# Patient Record
Sex: Male | Born: 1970 | Race: White | Hispanic: No | Marital: Single | Smoking: Never smoker
Health system: Southern US, Community
[De-identification: ages and names within clinical notes are randomized; demographics above are authoritative.]

## PROBLEM LIST (undated history)

## (undated) DIAGNOSIS — I10 Essential (primary) hypertension: Secondary | ICD-10-CM

## (undated) DIAGNOSIS — N189 Chronic kidney disease, unspecified: Secondary | ICD-10-CM

## (undated) DIAGNOSIS — K219 Gastro-esophageal reflux disease without esophagitis: Secondary | ICD-10-CM

## (undated) DIAGNOSIS — D649 Anemia, unspecified: Secondary | ICD-10-CM

## (undated) DIAGNOSIS — E119 Type 2 diabetes mellitus without complications: Secondary | ICD-10-CM

## (undated) DIAGNOSIS — R0600 Dyspnea, unspecified: Secondary | ICD-10-CM

## (undated) HISTORY — PX: CHOLECYSTECTOMY: SHX55

---

## 2016-07-19 DIAGNOSIS — E1142 Type 2 diabetes mellitus with diabetic polyneuropathy: Secondary | ICD-10-CM | POA: Insufficient documentation

## 2016-09-27 DIAGNOSIS — Z79899 Other long term (current) drug therapy: Secondary | ICD-10-CM | POA: Insufficient documentation

## 2020-02-12 DIAGNOSIS — R059 Cough, unspecified: Secondary | ICD-10-CM | POA: Insufficient documentation

## 2020-03-28 DIAGNOSIS — E43 Unspecified severe protein-calorie malnutrition: Secondary | ICD-10-CM | POA: Insufficient documentation

## 2020-04-06 DIAGNOSIS — T7840XS Allergy, unspecified, sequela: Secondary | ICD-10-CM | POA: Insufficient documentation

## 2020-04-06 DIAGNOSIS — Z87892 Personal history of anaphylaxis: Secondary | ICD-10-CM | POA: Insufficient documentation

## 2020-04-06 DIAGNOSIS — R52 Pain, unspecified: Secondary | ICD-10-CM | POA: Insufficient documentation

## 2020-04-06 DIAGNOSIS — D689 Coagulation defect, unspecified: Secondary | ICD-10-CM | POA: Insufficient documentation

## 2020-04-06 DIAGNOSIS — Z23 Encounter for immunization: Secondary | ICD-10-CM | POA: Insufficient documentation

## 2020-04-09 DIAGNOSIS — D509 Iron deficiency anemia, unspecified: Secondary | ICD-10-CM | POA: Insufficient documentation

## 2020-04-29 ENCOUNTER — Emergency Department (HOSPITAL_COMMUNITY): Payer: Medicare Other

## 2020-04-29 ENCOUNTER — Inpatient Hospital Stay (HOSPITAL_COMMUNITY)
Admission: EM | Admit: 2020-04-29 | Discharge: 2020-06-06 | DRG: 871 | Disposition: A | Discharge status: Rehabilitation Facility | Payer: Medicare Other | Source: Skilled Nursing Facility | Attending: Internal Medicine | Admitting: Internal Medicine

## 2020-04-29 ENCOUNTER — Inpatient Hospital Stay (HOSPITAL_COMMUNITY): Payer: Medicare Other

## 2020-04-29 ENCOUNTER — Other Ambulatory Visit: Payer: Self-pay

## 2020-04-29 DIAGNOSIS — J9811 Atelectasis: Secondary | ICD-10-CM | POA: Diagnosis not present

## 2020-04-29 DIAGNOSIS — L89154 Pressure ulcer of sacral region, stage 4: Secondary | ICD-10-CM | POA: Diagnosis not present

## 2020-04-29 DIAGNOSIS — E11621 Type 2 diabetes mellitus with foot ulcer: Secondary | ICD-10-CM | POA: Diagnosis present

## 2020-04-29 DIAGNOSIS — I10 Essential (primary) hypertension: Secondary | ICD-10-CM | POA: Diagnosis not present

## 2020-04-29 DIAGNOSIS — D631 Anemia in chronic kidney disease: Secondary | ICD-10-CM | POA: Diagnosis present

## 2020-04-29 DIAGNOSIS — E1169 Type 2 diabetes mellitus with other specified complication: Secondary | ICD-10-CM | POA: Diagnosis present

## 2020-04-29 DIAGNOSIS — Z9119 Patient's noncompliance with other medical treatment and regimen: Secondary | ICD-10-CM

## 2020-04-29 DIAGNOSIS — Z862 Personal history of diseases of the blood and blood-forming organs and certain disorders involving the immune mechanism: Secondary | ICD-10-CM

## 2020-04-29 DIAGNOSIS — G7281 Critical illness myopathy: Secondary | ICD-10-CM | POA: Diagnosis present

## 2020-04-29 DIAGNOSIS — E785 Hyperlipidemia, unspecified: Secondary | ICD-10-CM | POA: Diagnosis present

## 2020-04-29 DIAGNOSIS — S91031A Puncture wound without foreign body, right ankle, initial encounter: Secondary | ICD-10-CM | POA: Diagnosis not present

## 2020-04-29 DIAGNOSIS — E46 Unspecified protein-calorie malnutrition: Secondary | ICD-10-CM | POA: Diagnosis not present

## 2020-04-29 DIAGNOSIS — S31000A Unspecified open wound of lower back and pelvis without penetration into retroperitoneum, initial encounter: Secondary | ICD-10-CM

## 2020-04-29 DIAGNOSIS — M87076 Idiopathic aseptic necrosis of unspecified foot: Secondary | ICD-10-CM

## 2020-04-29 DIAGNOSIS — Z7401 Bed confinement status: Secondary | ICD-10-CM

## 2020-04-29 DIAGNOSIS — Z794 Long term (current) use of insulin: Secondary | ICD-10-CM

## 2020-04-29 DIAGNOSIS — Z6841 Body Mass Index (BMI) 40.0 and over, adult: Secondary | ICD-10-CM

## 2020-04-29 DIAGNOSIS — Z9049 Acquired absence of other specified parts of digestive tract: Secondary | ICD-10-CM | POA: Diagnosis not present

## 2020-04-29 DIAGNOSIS — E43 Unspecified severe protein-calorie malnutrition: Secondary | ICD-10-CM | POA: Diagnosis present

## 2020-04-29 DIAGNOSIS — J1282 Pneumonia due to coronavirus disease 2019: Secondary | ICD-10-CM | POA: Diagnosis present

## 2020-04-29 DIAGNOSIS — E1122 Type 2 diabetes mellitus with diabetic chronic kidney disease: Secondary | ICD-10-CM | POA: Diagnosis present

## 2020-04-29 DIAGNOSIS — L89610 Pressure ulcer of right heel, unstageable: Secondary | ICD-10-CM | POA: Diagnosis present

## 2020-04-29 DIAGNOSIS — Z79899 Other long term (current) drug therapy: Secondary | ICD-10-CM

## 2020-04-29 DIAGNOSIS — T827XXA Infection and inflammatory reaction due to other cardiac and vascular devices, implants and grafts, initial encounter: Secondary | ICD-10-CM | POA: Diagnosis not present

## 2020-04-29 DIAGNOSIS — L89324 Pressure ulcer of left buttock, stage 4: Secondary | ICD-10-CM | POA: Diagnosis present

## 2020-04-29 DIAGNOSIS — R52 Pain, unspecified: Secondary | ICD-10-CM | POA: Diagnosis not present

## 2020-04-29 DIAGNOSIS — L89150 Pressure ulcer of sacral region, unstageable: Secondary | ICD-10-CM | POA: Diagnosis present

## 2020-04-29 DIAGNOSIS — S91302A Unspecified open wound, left foot, initial encounter: Secondary | ICD-10-CM | POA: Diagnosis not present

## 2020-04-29 DIAGNOSIS — E1152 Type 2 diabetes mellitus with diabetic peripheral angiopathy with gangrene: Secondary | ICD-10-CM | POA: Diagnosis present

## 2020-04-29 DIAGNOSIS — L89620 Pressure ulcer of left heel, unstageable: Secondary | ICD-10-CM | POA: Diagnosis present

## 2020-04-29 DIAGNOSIS — N186 End stage renal disease: Secondary | ICD-10-CM | POA: Diagnosis present

## 2020-04-29 DIAGNOSIS — A4189 Other specified sepsis: Principal | ICD-10-CM | POA: Diagnosis present

## 2020-04-29 DIAGNOSIS — E1140 Type 2 diabetes mellitus with diabetic neuropathy, unspecified: Secondary | ICD-10-CM | POA: Diagnosis present

## 2020-04-29 DIAGNOSIS — R195 Other fecal abnormalities: Secondary | ICD-10-CM | POA: Diagnosis not present

## 2020-04-29 DIAGNOSIS — S91309A Unspecified open wound, unspecified foot, initial encounter: Secondary | ICD-10-CM | POA: Diagnosis not present

## 2020-04-29 DIAGNOSIS — Z992 Dependence on renal dialysis: Secondary | ICD-10-CM | POA: Diagnosis not present

## 2020-04-29 DIAGNOSIS — M879 Osteonecrosis, unspecified: Secondary | ICD-10-CM | POA: Diagnosis present

## 2020-04-29 DIAGNOSIS — L97529 Non-pressure chronic ulcer of other part of left foot with unspecified severity: Secondary | ICD-10-CM | POA: Diagnosis present

## 2020-04-29 DIAGNOSIS — M4628 Osteomyelitis of vertebra, sacral and sacrococcygeal region: Secondary | ICD-10-CM | POA: Diagnosis present

## 2020-04-29 DIAGNOSIS — I120 Hypertensive chronic kidney disease with stage 5 chronic kidney disease or end stage renal disease: Secondary | ICD-10-CM | POA: Diagnosis present

## 2020-04-29 DIAGNOSIS — U071 COVID-19: Secondary | ICD-10-CM | POA: Diagnosis present

## 2020-04-29 DIAGNOSIS — E119 Type 2 diabetes mellitus without complications: Secondary | ICD-10-CM

## 2020-04-29 DIAGNOSIS — D649 Anemia, unspecified: Secondary | ICD-10-CM | POA: Diagnosis not present

## 2020-04-29 DIAGNOSIS — L89156 Pressure-induced deep tissue damage of sacral region: Secondary | ICD-10-CM | POA: Diagnosis not present

## 2020-04-29 DIAGNOSIS — L03312 Cellulitis of back [any part except buttock]: Secondary | ICD-10-CM | POA: Diagnosis present

## 2020-04-29 DIAGNOSIS — A0472 Enterocolitis due to Clostridium difficile, not specified as recurrent: Secondary | ICD-10-CM | POA: Diagnosis not present

## 2020-04-29 DIAGNOSIS — N2581 Secondary hyperparathyroidism of renal origin: Secondary | ICD-10-CM | POA: Diagnosis present

## 2020-04-29 DIAGNOSIS — S91301A Unspecified open wound, right foot, initial encounter: Secondary | ICD-10-CM | POA: Diagnosis not present

## 2020-04-29 DIAGNOSIS — L97519 Non-pressure chronic ulcer of other part of right foot with unspecified severity: Secondary | ICD-10-CM | POA: Diagnosis present

## 2020-04-29 DIAGNOSIS — I96 Gangrene, not elsewhere classified: Secondary | ICD-10-CM | POA: Diagnosis not present

## 2020-04-29 DIAGNOSIS — R509 Fever, unspecified: Secondary | ICD-10-CM | POA: Diagnosis present

## 2020-04-29 DIAGNOSIS — R5383 Other fatigue: Secondary | ICD-10-CM | POA: Diagnosis not present

## 2020-04-29 DIAGNOSIS — A419 Sepsis, unspecified organism: Secondary | ICD-10-CM | POA: Diagnosis not present

## 2020-04-29 DIAGNOSIS — Y828 Other medical devices associated with adverse incidents: Secondary | ICD-10-CM | POA: Diagnosis not present

## 2020-04-29 DIAGNOSIS — Z6838 Body mass index (BMI) 38.0-38.9, adult: Secondary | ICD-10-CM | POA: Diagnosis not present

## 2020-04-29 DIAGNOSIS — K5900 Constipation, unspecified: Secondary | ICD-10-CM | POA: Diagnosis not present

## 2020-04-29 DIAGNOSIS — I951 Orthostatic hypotension: Secondary | ICD-10-CM | POA: Diagnosis not present

## 2020-04-29 DIAGNOSIS — M869 Osteomyelitis, unspecified: Secondary | ICD-10-CM

## 2020-04-29 DIAGNOSIS — R197 Diarrhea, unspecified: Secondary | ICD-10-CM | POA: Diagnosis not present

## 2020-04-29 DIAGNOSIS — Z8619 Personal history of other infectious and parasitic diseases: Secondary | ICD-10-CM | POA: Diagnosis not present

## 2020-04-29 DIAGNOSIS — E1165 Type 2 diabetes mellitus with hyperglycemia: Secondary | ICD-10-CM | POA: Diagnosis not present

## 2020-04-29 DIAGNOSIS — U099 Post covid-19 condition, unspecified: Secondary | ICD-10-CM | POA: Diagnosis present

## 2020-04-29 DIAGNOSIS — N189 Chronic kidney disease, unspecified: Secondary | ICD-10-CM

## 2020-04-29 DIAGNOSIS — K567 Ileus, unspecified: Secondary | ICD-10-CM | POA: Diagnosis not present

## 2020-04-29 DIAGNOSIS — M8619 Other acute osteomyelitis, multiple sites: Secondary | ICD-10-CM | POA: Diagnosis not present

## 2020-04-29 DIAGNOSIS — Z20822 Contact with and (suspected) exposure to covid-19: Secondary | ICD-10-CM | POA: Diagnosis present

## 2020-04-29 DIAGNOSIS — L89890 Pressure ulcer of other site, unstageable: Secondary | ICD-10-CM | POA: Diagnosis present

## 2020-04-29 HISTORY — DX: Chronic kidney disease, unspecified: N18.9

## 2020-04-29 HISTORY — DX: Type 2 diabetes mellitus without complications: E11.9

## 2020-04-29 HISTORY — DX: Essential (primary) hypertension: I10

## 2020-04-29 LAB — COMPREHENSIVE METABOLIC PANEL
ALT: 21 U/L (ref 0–44)
ALT: 18 U/L (ref 0–44)
AST: 46 U/L — ABNORMAL HIGH (ref 15–41)
AST: 41 U/L (ref 15–41)
Albumin: 1.6 g/dL — ABNORMAL LOW (ref 3.5–5.0)
Albumin: 1.5 g/dL — ABNORMAL LOW (ref 3.5–5.0)
Alkaline Phosphatase: 186 U/L — ABNORMAL HIGH (ref 38–126)
Alkaline Phosphatase: 177 U/L — ABNORMAL HIGH (ref 38–126)
Anion gap: 10 (ref 5–15)
Anion gap: 10 (ref 5–15)
BUN: 11 mg/dL (ref 6–20)
BUN: 15 mg/dL (ref 6–20)
CO2: 26 mmol/L (ref 22–32)
CO2: 23 mmol/L (ref 22–32)
Calcium: 7.3 mg/dL — ABNORMAL LOW (ref 8.9–10.3)
Calcium: 7.2 mg/dL — ABNORMAL LOW (ref 8.9–10.3)
Chloride: 101 mmol/L (ref 98–111)
Chloride: 102 mmol/L (ref 98–111)
Creatinine, Ser: 4.41 mg/dL — ABNORMAL HIGH (ref 0.61–1.24)
Creatinine, Ser: 4.62 mg/dL — ABNORMAL HIGH (ref 0.61–1.24)
GFR, Estimated: 16 mL/min — ABNORMAL LOW (ref >60)
GFR, Estimated: 15 mL/min — ABNORMAL LOW (ref >60)
Glucose, Bld: 84 mg/dL (ref 70–99)
Glucose, Bld: 80 mg/dL (ref 70–99)
Potassium: 3.8 mmol/L (ref 3.5–5.1)
Potassium: 3.5 mmol/L (ref 3.5–5.1)
Sodium: 137 mmol/L (ref 135–145)
Sodium: 135 mmol/L (ref 135–145)
Total Bilirubin: 0.5 mg/dL (ref 0.3–1.2)
Total Bilirubin: 0.7 mg/dL (ref 0.3–1.2)
Total Protein: 6 g/dL — ABNORMAL LOW (ref 6.5–8.1)
Total Protein: 6 g/dL — ABNORMAL LOW (ref 6.5–8.1)

## 2020-04-29 LAB — CBC WITH DIFFERENTIAL/PLATELET
Abs Immature Granulocytes: 0 10*3/uL (ref 0.00–0.07)
Basophils Absolute: 0 10*3/uL (ref 0.0–0.1)
Basophils Absolute: 0 10*3/uL (ref 0.0–0.1)
Basophils Relative: 0 %
Basophils Relative: 0 %
Eosinophils Absolute: 0 10*3/uL (ref 0.0–0.5)
Eosinophils Absolute: 0 10*3/uL (ref 0.0–0.5)
Eosinophils Relative: 0 %
Eosinophils Relative: 0 %
HCT: 27.1 % — ABNORMAL LOW (ref 39.0–52.0)
HCT: 27.8 % — ABNORMAL LOW (ref 39.0–52.0)
Hemoglobin: 7.8 g/dL — ABNORMAL LOW (ref 13.0–17.0)
Hemoglobin: 8.3 g/dL — ABNORMAL LOW (ref 13.0–17.0)
Lymphocytes Relative: 0 %
Lymphocytes Relative: 1 %
Lymphs Abs: 0 10*3/uL — ABNORMAL LOW (ref 0.7–4.0)
Lymphs Abs: 0.1 10*3/uL — ABNORMAL LOW (ref 0.7–4.0)
MCH: 24.9 pg — ABNORMAL LOW (ref 26.0–34.0)
MCH: 25.6 pg — ABNORMAL LOW (ref 26.0–34.0)
MCHC: 28.8 g/dL — ABNORMAL LOW (ref 30.0–36.0)
MCHC: 29.9 g/dL — ABNORMAL LOW (ref 30.0–36.0)
MCV: 86.6 fL (ref 80.0–100.0)
MCV: 85.8 fL (ref 80.0–100.0)
Monocytes Absolute: 0.1 10*3/uL (ref 0.1–1.0)
Monocytes Absolute: 0.2 10*3/uL (ref 0.1–1.0)
Monocytes Relative: 1 %
Monocytes Relative: 3 %
Myelocytes: 1 %
Neutro Abs: 5.7 10*3/uL (ref 1.7–7.7)
Neutro Abs: 5.4 10*3/uL (ref 1.7–7.7)
Neutrophils Relative %: 99 %
Neutrophils Relative %: 94 %
Platelets: 251 10*3/uL (ref 150–400)
Platelets: 242 10*3/uL (ref 150–400)
Promyelocytes Relative: 1 %
RBC: 3.13 MIL/uL — ABNORMAL LOW (ref 4.22–5.81)
RBC: 3.24 MIL/uL — ABNORMAL LOW (ref 4.22–5.81)
RDW: 16.4 % — ABNORMAL HIGH (ref 11.5–15.5)
RDW: 16.4 % — ABNORMAL HIGH (ref 11.5–15.5)
WBC: 5.8 10*3/uL (ref 4.0–10.5)
WBC: 5.7 10*3/uL (ref 4.0–10.5)
nRBC: 0 % (ref 0.0–0.2)
nRBC: 0 /100 WBC
nRBC: 0 % (ref 0.0–0.2)
nRBC: 0 /100 WBC

## 2020-04-29 LAB — POC OCCULT BLOOD, ED: Fecal Occult Bld: NEGATIVE

## 2020-04-29 LAB — CBG MONITORING, ED: Glucose-Capillary: 82 mg/dL (ref 70–99)

## 2020-04-29 LAB — RESP PANEL BY RT-PCR (FLU A&B, COVID) ARPGX2
Influenza A by PCR: NEGATIVE
Influenza B by PCR: NEGATIVE
SARS Coronavirus 2 by RT PCR: POSITIVE — AB

## 2020-04-29 LAB — LACTIC ACID, PLASMA
Lactic Acid, Venous: 0.9 mmol/L (ref 0.5–1.9)
Lactic Acid, Venous: 0.9 mmol/L (ref 0.5–1.9)

## 2020-04-29 LAB — PROCALCITONIN: Procalcitonin: 1.24 ng/mL

## 2020-04-29 LAB — FERRITIN: Ferritin: 1520 ng/mL — ABNORMAL HIGH (ref 24–336)

## 2020-04-29 LAB — HIV ANTIBODY (ROUTINE TESTING W REFLEX): HIV Screen 4th Generation wRfx: NONREACTIVE

## 2020-04-29 LAB — PROTIME-INR
INR: 1.2 (ref 0.8–1.2)
Prothrombin Time: 14.7 seconds (ref 11.4–15.2)

## 2020-04-29 LAB — APTT: aPTT: 38 seconds — ABNORMAL HIGH (ref 24–36)

## 2020-04-29 LAB — C-REACTIVE PROTEIN: CRP: 12.6 mg/dL — ABNORMAL HIGH (ref <1.0)

## 2020-04-29 LAB — D-DIMER, QUANTITATIVE: D-Dimer, Quant: 2.4 ug/mL-FEU — ABNORMAL HIGH (ref 0.00–0.50)

## 2020-04-29 MED ORDER — VANCOMYCIN VARIABLE DOSE PER UNSTABLE RENAL FUNCTION (PHARMACIST DOSING): Status: DC

## 2020-04-29 MED ORDER — SODIUM CHLORIDE 0.9 % IV SOLN
2.0000 g | Freq: Once | INTRAVENOUS | Status: AC
  Administered 2020-04-29: 2 g via INTRAVENOUS
  Filled 2020-04-29: qty 2

## 2020-04-29 MED ORDER — VANCOMYCIN HCL 10 G IV SOLR
2500.0000 mg | Freq: Once | INTRAVENOUS | Status: AC
  Administered 2020-04-29: 2500 mg via INTRAVENOUS
  Filled 2020-04-29: qty 2500

## 2020-04-29 MED ORDER — INSULIN ASPART 100 UNIT/ML ~~LOC~~ SOLN: 0.0000 [IU] | SUBCUTANEOUS | Status: DC

## 2020-04-29 MED ORDER — SODIUM CHLORIDE 0.9 % IV SOLN
1.0000 g | INTRAVENOUS | Status: DC
  Filled 2020-04-29: qty 1

## 2020-04-29 MED ORDER — ONDANSETRON HCL 4 MG/2ML IJ SOLN: 4.0000 mg | Freq: Four times a day (QID) | INTRAMUSCULAR | Status: DC | PRN

## 2020-04-29 MED ORDER — HEPARIN SODIUM (PORCINE) 5000 UNIT/ML IJ SOLN
5000.0000 [IU] | Freq: Three times a day (TID) | INTRAMUSCULAR | Status: DC
  Administered 2020-04-30 – 2020-05-03 (×11): 5000 [IU] via SUBCUTANEOUS
  Filled 2020-04-29 (×11): qty 1

## 2020-04-29 MED ORDER — OXYCODONE-ACETAMINOPHEN 5-325 MG PO TABS
1.0000 | ORAL_TABLET | Freq: Four times a day (QID) | ORAL | Status: DC | PRN
  Administered 2020-05-01: 1 via ORAL
  Filled 2020-04-29: qty 1

## 2020-04-29 MED ORDER — METRONIDAZOLE IN NACL 5-0.79 MG/ML-% IV SOLN
500.0000 mg | Freq: Once | INTRAVENOUS | Status: AC
  Administered 2020-04-29: 500 mg via INTRAVENOUS
  Filled 2020-04-29: qty 100

## 2020-04-29 MED ORDER — FAMOTIDINE 20 MG PO TABS
20.0000 mg | ORAL_TABLET | Freq: Two times a day (BID) | ORAL | Status: DC
  Administered 2020-04-30 – 2020-05-08 (×17): 20 mg via ORAL
  Filled 2020-04-29 (×17): qty 1

## 2020-04-29 MED ORDER — FENTANYL CITRATE (PF) 100 MCG/2ML IJ SOLN
100.0000 ug | Freq: Once | INTRAMUSCULAR | Status: DC
  Filled 2020-04-29: qty 2

## 2020-04-29 MED ORDER — SODIUM CHLORIDE 0.9% FLUSH
3.0000 mL | Freq: Two times a day (BID) | INTRAVENOUS | Status: DC
  Administered 2020-04-30 – 2020-06-06 (×58): 3 mL via INTRAVENOUS

## 2020-04-29 MED ORDER — ONDANSETRON HCL 4 MG PO TABS: 4.0000 mg | ORAL_TABLET | Freq: Four times a day (QID) | ORAL | Status: DC | PRN

## 2020-04-29 MED ORDER — ACETAMINOPHEN 325 MG PO TABS
650.0000 mg | ORAL_TABLET | Freq: Once | ORAL | Status: AC
  Administered 2020-04-29: 650 mg via ORAL
  Filled 2020-04-29: qty 2

## 2020-04-29 MED ORDER — DIAZEPAM 5 MG/ML IJ SOLN
2.5000 mg | Freq: Once | INTRAMUSCULAR | Status: AC
  Administered 2020-04-29: 2.5 mg via INTRAVENOUS
  Filled 2020-04-29: qty 2

## 2020-04-29 MED ORDER — AMLODIPINE BESYLATE 5 MG PO TABS
10.0000 mg | ORAL_TABLET | Freq: Every day | ORAL | Status: DC
  Administered 2020-04-29: 10 mg via ORAL
  Filled 2020-04-29: qty 2

## 2020-04-29 MED ORDER — SODIUM CHLORIDE 0.9 % IV SOLN
100.0000 mg | Freq: Every day | INTRAVENOUS | Status: AC
  Administered 2020-04-30 – 2020-05-03 (×4): 100 mg via INTRAVENOUS
  Filled 2020-04-29 (×5): qty 20

## 2020-04-29 MED ORDER — METOCLOPRAMIDE HCL 5 MG PO TABS
5.0000 mg | ORAL_TABLET | Freq: Three times a day (TID) | ORAL | Status: DC
  Administered 2020-04-30 – 2020-06-05 (×72): 5 mg via ORAL
  Filled 2020-04-29 (×90): qty 1

## 2020-04-29 MED ORDER — SENNA 8.6 MG PO TABS
1.0000 | ORAL_TABLET | Freq: Two times a day (BID) | ORAL | Status: DC
  Administered 2020-04-30 – 2020-05-29 (×30): 8.6 mg via ORAL
  Filled 2020-04-29 (×53): qty 1

## 2020-04-29 MED ORDER — SODIUM CHLORIDE 0.9 % IV SOLN
200.0000 mg | Freq: Once | INTRAVENOUS | Status: AC
  Administered 2020-04-29: 200 mg via INTRAVENOUS
  Filled 2020-04-29: qty 40

## 2020-04-29 MED ORDER — VANCOMYCIN HCL IN DEXTROSE 1-5 GM/200ML-% IV SOLN: 1000.0000 mg | Freq: Once | INTRAVENOUS | Status: DC

## 2020-04-29 MED ORDER — ACETAMINOPHEN 325 MG PO TABS
650.0000 mg | ORAL_TABLET | Freq: Four times a day (QID) | ORAL | Status: DC | PRN
  Administered 2020-05-28: 650 mg via ORAL
  Filled 2020-04-29: qty 2

## 2020-04-29 NOTE — Progress Notes (Incomplete)
ESRD new (MWF) last on Wednesday. Still making urine  hypoxic 82%  [place] 3 lL Febrile tachy  Flagyl cefepime and vanc COVID november 30th. Versus bacterial infection  Sacral wound cellulites Left necrotic  Hgb 7.8 FOBt negative  Concern for possibly PE

## 2020-04-29 NOTE — ED Provider Notes (Signed)
  49 year old male from [place], ESRD on HD.  Hypoxic to 82% on room air at the facility and now with new O2 requirement of 3 L nasal cannula.  Concern for sepsis given his fever and tachycardia.  Given Flagyl, cefepime, vancomycin.  Has a sacral wound that is possible source of infection with surrounding cellulitis.  Additionally, his left toe might be necrotic.  Hemoglobin of 7.8 but fecal occult was negative.  No records in our EMR as he is usually seen outside hospitals.  Has been Covid positive since 03/24/2020. Discussed concern for PE but he is still making urine and so we will hold off on contrasted scan at this time; may need VQ scan.  Additionally, holding on Decadron for Covid due to concern for cellulitis.  Plan -Admit  Physical Exam  BP (!) 187/108   Pulse (!) 101   Temp (!) 100.9 F (38.3 C)   Resp (!) 22   Ht 6\' 6"  (1.981 m)   Wt (!) 158.8 kg   SpO2 98%   BMI 40.45 kg/m   Physical Exam Constitutional:      General: He is not in acute distress.    Appearance: He is obese. He is not ill-appearing.  Neurological:     Mental Status: He is alert.     ED Course/Procedures   Clinical Course as of 04/29/20 1425  Thu Apr 29, 2020  1219 DG Chest [place] 1 View IMPRESSION: Diffuse bilateral airspace disease most likely pneumonia. Correlate with COVID-19 status. [CG]  1219 Temp(!): 100.9 F (38.3 C) [CG]  1219 Pulse Rate(!): 101 [CG]  1219 Resp(!): 21 [CG]  1219 SpO2: 100 % On 3 L [place]  [CG]  1249 Hemoglobin(!): 7.8 [CG]  1249 Creatinine(!): 4.41 [CG]  1249 Alkaline Phosphatase(!): 186 [CG]  1249 Anion gap: 10 [CG]  1249 AST(!): 46 [CG]  1249 Lactic Acid, Venous: 0.9 [CG]  1304 Patient gave verbal consent to speak to sister. Called sister. Reports patient has had "mental" changes, refusing medicines, refusing food for last 3 days. Reports patient has been "odd" all his life, ?Asperger's? Was functional, worked, Social research officer, government. Last year began declining. Sick on and off. September  was septic, kidney failure, started HD. Source was GB, removed JP drain, several antibiotics. H/o C.diff. Patient originally from [place]. Went to [facility], started walking. Then got sick again, discharged to [facility]. Been there for 3 weeks. History of anemia on iron supplements. Has had a cough since GB surgery.  [CG]    Clinical Course User Index [CG] Kinnie Feil, PA-C    Procedures  MDM  On assessment just after handoff, patient satting well on 3 L nasal cannula.  Patient denies any chest pain or shortness of breath at this time.  Patient has a right Vas-Cath and states that he gets dialysis Monday/Wednesday/Friday and last had a full session yesterday.  Given lack of chest pain or shortness of breath, do not feel that emergent CT PE is indicated at this time.  May need additional imaging inpatient.   Hospitalist consulted for admission, handoff given. Admitted in stable condition.          Darrick Huntsman, MD 04/29/20 1931    Maudie Flakes, MD 05/03/20 2326

## 2020-04-29 NOTE — Progress Notes (Signed)
RT instructed patient on the use of a flutter valve and incentive spirometer. Patient able to reach 1250 mL with the incentive spirometer and had a strong non productive cough after the flutter valve.

## 2020-04-29 NOTE — ED Triage Notes (Signed)
BIB by [facility] from [place] after facility called to report that pt 02 sat was 82% on RA. Per EMS, pt placed on 3 L [place]. PT tested positive for covid on 11/30. Pt currently denies ShOB. Pt has hx of HTN, DM, dialysis, + c.diff. Per facility pt has been noncompliant with HTN meds x 3.

## 2020-04-29 NOTE — Progress Notes (Signed)
Pharmacy Antibiotic Note  Fred [name] is a 49 y.o. male admitted on 04/29/2020 with sepsis.  Pharmacy has been consulted for vancomycin and cefepime dosing.  Febrile, tachy, RR 21.  Hx ESRD-HD  Plan: Vancomycin 2500 mg IV x 1, then 1000 mg IV qHD (target pre-HD vancomycin level 15-25) Cefepime 2g IV x 1, then 1g IV q24h Monitor HD schedule, Cx and clinical progression to narrow Vancomycin level as needed  Height: 6\' 6"  (198.1 cm) Weight: (!) 158.8 kg (350 lb) IBW/kg (Calculated) : 91.4  Temp (24hrs), Avg:100.9 F (38.3 C), Min:100.9 F (38.3 C), Max:100.9 F (38.3 C)  No results for input(s): WBC, CREATININE, LATICACIDVEN, VANCOTROUGH, VANCOPEAK, VANCORANDOM, GENTTROUGH, GENTPEAK, GENTRANDOM, TOBRATROUGH, TOBRAPEAK, TOBRARND, AMIKACINPEAK, AMIKACINTROU, AMIKACIN in the last 168 hours.  CrCl cannot be calculated (No successful lab value found.).    Not on File  Bertis Ruddy, PharmD Clinical Pharmacist ED Pharmacist Phone # [phone] 04/29/2020 11:56 AM

## 2020-04-29 NOTE — ED Notes (Signed)
Pt transported to MRI 

## 2020-04-29 NOTE — ED Notes (Signed)
Wound assessment completed by PA Gibbons:  Buttock 7 x 5 Right MTP 2 x 2 Right heel 7 x 4 Left MTP 3 x 2 Left 4th toe dry gangrene Left 5th toe red

## 2020-04-29 NOTE — H&P (Addendum)
Date: 04/29/2020               Patient Name:  Fred [name] MRN: [id number]  DOB: 04/19/1971 Age / Sex: 49 y.o., male   PCP: Pcp, No         Medical Service: Internal Medicine Teaching Service         Attending Physician: Dr. Maudie Flakes, MD    First Contact: Iona Beard Pager: [phone]  Second Contact: Blenda Nicely Pager: [place] [phone])       After Hours (After 5p/  First Contact Pager: [phone]  weekends / holidays): Second Contact Pager: [phone]   Chief Complaint: SOB  History of Present Illness: 49 y/o male with  history of HTN, DM on insulin, ESRD, Anemia who presents from [facility] because the staff stated that he wasnt eating the way he usually does. Patient states that he has been feeling his usual self and has been having more pain from his sacral wound with increasing foul smell he is unsure how long he has had this sacral wound, but has had it for at least the past several months. Patient tested positive for COVID on 04/20/2020, not previously vaccinate. States he has been having cough producing yellow green sputum for the last month, but denies shortness of breath, loss smell or taste, fever, chest pain, SOB, chills, abdominal pain, nausea, vomiting or diarrhea.   He also notes pain in both feet from bilateral foot wounds. States the used to be more painful but has not been able to feel due to numbness. Had his gall bladder removed due to infections and found out he had kidney problems this occurred at a hospital in [place] 4-5 months ago. He reports being on ESRD for the past month on MWF was able complete session yesterday. Still makes a small amount of urine.   ED Course: Found to be Hypertensive, tachycardic hyperthermic. WBC 5.8, hgb 7.8, BUN 11, Cr. 4.4 unknown baseline, lactic acid normal x2. COVID positive. CXR with diffuse bilateral airspace disease. No osteomyelitis on bilateral foot xrays, pelvic xray, and ankle xrays.    Lab Orders     Blood  Culture (routine x 2)     Urine culture     Resp Panel by RT-PCR (Flu A&B, Covid) Nasopharyngeal Swab     Lactic acid, plasma     Comprehensive metabolic panel     CBC WITH DIFFERENTIAL     Protime-INR     APTT     Urinalysis, Routine w reflex microscopic     Procalcitonin - Baseline     Procalcitonin     POC occult blood, ED   Meds:  Current Meds  Medication Sig  . acetaminophen (TYLENOL) 325 MG tablet Take 650 mg by mouth every 6 (six) hours as needed for mild pain or fever (>101 F).  Marland Kitchen amLODipine (NORVASC) 10 MG tablet Take 10 mg by mouth daily.  . benzonatate (TESSALON) 100 MG capsule Take 100 mg by mouth 3 (three) times daily as needed for cough.  . Cholecalciferol 25 MCG (1000 UT) tablet Take 1,000 Units by mouth daily.  Marland Kitchen epoetin alfa-epbx (RETACRIT) 63149 UNIT/ML injection Inject 10,000 Units into the skin every Monday, Wednesday, and Friday.  . famotidine (PEPCID) 20 MG tablet Take 20 mg by mouth 2 (two) times daily.  . hydrALAZINE (APRESOLINE) 10 MG tablet Take 10 mg by mouth every 6 (six) hours as needed (SBP>180 OR DBP>100).  . insulin lispro (HUMALOG) 100 UNIT/ML injection Inject  5-15 Units into the skin 4 (four) times daily -  with meals and at bedtime. Per sliding scale 70-79= 0 units 180-200= 5 units 201-250= 8 units 251-300= 10 units 301-350= 12 units 351-400= 15 units Greater than 400 call MD  . iron polysaccharides (NIFEREX) 150 MG capsule Take 150 mg by mouth daily.  . Lactobacillus Rhamnosus, GG, (CULTURELLE PO) Take 1 capsule by mouth daily. 10 Billion cell capsule  . metoCLOPramide (REGLAN) 5 MG tablet Take 5 mg by mouth 3 (three) times daily before meals.  . metoprolol tartrate (LOPRESSOR) 50 MG tablet Take 50 mg by mouth 2 (two) times daily.  . multivitamin (RENA-VIT) TABS tablet Take 1 tablet by mouth daily.  Marland Kitchen oxyCODONE-acetaminophen (PERCOCET/ROXICET) 5-325 MG tablet Take 1 tablet by mouth every 6 (six) hours as needed for severe pain or moderate pain  ((4-6) for up to 5 days).  . sodium chloride 1 g tablet Take 1 g by mouth in the morning and at bedtime.  . tamsulosin (FLOMAX) 0.4 MG CAPS capsule Take 0.4 mg by mouth daily.    Past medical history: No past medical history on file.  HTN, DM on insulin, ESRD  Social: Patient from [facility]. Live with father. Denies tobacco, alcohol and drug use.   Family History: Mother with history heart disease, does not know of any medical history in father  Allergies: Allergies as of 04/29/2020 - Review Complete 04/29/2020  Allergen Reaction Noted  . Atorvastatin Other (See Comments) 04/29/2020   No past medical history on file.   Review of Systems: A complete ROS was negative except as per HPI.   Physical Exam: Blood pressure (!) 182/102, pulse (!) 101, temperature 98.6 F (37 C), temperature source Oral, resp. rate 15, height 6\' 6"  (1.981 m), weight (!) 158.8 kg, SpO2 96 %. Physical Exam Constitutional:      Appearance: He is obese.  HENT:     Head: Normocephalic and atraumatic.  Eyes:     Extraocular Movements: Extraocular movements intact.     Pupils: Pupils are equal, round, and reactive to light.  Cardiovascular:     Rate and Rhythm: Regular rhythm. Tachycardia present.     Heart sounds: No murmur heard.     Comments: diminished PT, DP pulses bilaterally Pulmonary:     Effort: Pulmonary effort is normal. No tachypnea or accessory muscle usage.     Breath sounds: Normal breath sounds.  Abdominal:     General: Bowel sounds are normal.     Palpations: Abdomen is soft.     Tenderness: There is no abdominal tenderness.  Musculoskeletal:     Cervical back: Normal range of motion and neck supple.     Right lower leg: Edema present.     Left lower leg: Edema present.  Skin:    Capillary Refill: Capillary refill takes less than 2 seconds.     Comments: Bilateral lower extremities cool to touch, hairless, see images for sacral wound, See images of multiple unstable ulcers on  bilateral feet, and necrotic left 4th toe  Neurological:     General: No focal deficit present.     Mental Status: He is alert and oriented to person, place, and time.  Psychiatric:        Mood and Affect: Mood normal.        Behavior: Behavior normal.    Media Information                Labs: CBC    Component Value Date/Time  WBC 5.8 04/29/2020 1214   RBC 3.13 (L) 04/29/2020 1214   HGB 7.8 (L) 04/29/2020 1214   HCT 27.1 (L) 04/29/2020 1214   PLT 251 04/29/2020 1214   MCV 86.6 04/29/2020 1214   MCH 24.9 (L) 04/29/2020 1214   MCHC 28.8 (L) 04/29/2020 1214   RDW 16.4 (H) 04/29/2020 1214   LYMPHSABS 0.0 (L) 04/29/2020 1214   MONOABS 0.1 04/29/2020 1214   EOSABS 0.0 04/29/2020 1214   BASOSABS 0.0 04/29/2020 1214     CMP     Component Value Date/Time   NA 137 04/29/2020 1214   K 3.8 04/29/2020 1214   CL 101 04/29/2020 1214   CO2 26 04/29/2020 1214   GLUCOSE 84 04/29/2020 1214   BUN 11 04/29/2020 1214   CREATININE 4.41 (H) 04/29/2020 1214   CALCIUM 7.3 (L) 04/29/2020 1214   PROT 6.0 (L) 04/29/2020 1214   ALBUMIN 1.6 (L) 04/29/2020 1214   AST 46 (H) 04/29/2020 1214   ALT 21 04/29/2020 1214   ALKPHOS 186 (H) 04/29/2020 1214   BILITOT 0.5 04/29/2020 1214   GFRNONAA 16 (L) 04/29/2020 1214    Imaging: DG Ankle 2 Views Right  Result Date: 04/29/2020 CLINICAL DATA:  Bilateral foot wounds EXAM: RIGHT ANKLE - 2 VIEW COMPARISON:  Right foot series today FINDINGS: Diffuse dense vascular calcifications. No acute bony abnormality. Specifically, no fracture, subluxation, or dislocation. No bone destruction. Soft tissues are intact. Plantar calcaneal spur. IMPRESSION: No acute bony abnormality. Electronically Signed   By: Rolm Baptise M.D.   On: 04/29/2020 13:40   DG Pelvis Portable  Result Date: 04/29/2020 CLINICAL DATA:  Sacral wounds EXAM: PORTABLE PELVIS 1-2 VIEWS COMPARISON:  None. FINDINGS: No acute bony abnormality. Specifically, no fracture, subluxation, or  dislocation. No bone destruction. Diffuse vascular calcifications. IMPRESSION: No acute bony abnormality. Electronically Signed   By: Rolm Baptise M.D.   On: 04/29/2020 13:43   DG Chest Port 1 View  Result Date: 04/29/2020 CLINICAL DATA:  Question sepsis. EXAM: PORTABLE CHEST 1 VIEW COMPARISON:  None. FINDINGS: Heart size upper normal. Diffuse bilateral airspace disease with patchy airspace disease right greater than left. No effusion. Right jugular dual lumen catheter tip in the SVC at the cavoatrial junction. No pneumothorax IMPRESSION: Diffuse bilateral airspace disease most likely pneumonia. Correlate with COVID-19 status. Electronically Signed   By: Franchot Gallo M.D.   On: 04/29/2020 12:09   DG Foot 2 Views Left  Result Date: 04/29/2020 CLINICAL DATA:  Bilateral foot wounds EXAM: LEFT FOOT - 2 VIEW COMPARISON:  None. FINDINGS: Deformity of the 3rd through 5th metatarsals, likely related to old injury. There appears to be fusion across the 2nd through 5th tarsal metatarsal joints. No acute fracture, subluxation or dislocation. No bone destruction. Diffuse vascular calcifications. Soft tissues intact. IMPRESSION: Deformity of the 2nd through 5th metatarsals with fusion across the 2nd through 5th tarsal metatarsal joints, possibly related to old trauma. No acute bony abnormality. Electronically Signed   By: Rolm Baptise M.D.   On: 04/29/2020 13:42   DG Foot 2 Views Right  Result Date: 04/29/2020 CLINICAL DATA:  Bilateral feet wounds EXAM: RIGHT FOOT - 2 VIEW COMPARISON:  None. FINDINGS: Diffuse dense vascular calcifications. No acute bony abnormality. Specifically, no fracture, subluxation, or dislocation. No bone destruction. Soft tissues are intact. Plantar calcaneal spur. IMPRESSION: No acute bony abnormality. Electronically Signed   By: Rolm Baptise M.D.   On: 04/29/2020 13:40    EKG: personally reviewed my interpretation is HR 103 sinus  tach  Assessment & Plan by Problem: Active  Problems:   Insulin dependent type 2 diabetes mellitus (HCC)   HTN (hypertension)   History of anemia due to chronic kidney disease   Sacral decubitus ulcer, stage IV (HCC)   Pressure injury of both heels, unstageable (HCC)   Pneumonia due to COVID-19 virus   ESRD (end stage renal disease) ([place])  49 y/o male with  history of HTN, DM on insulin, ESRD, Anemia who presents from [facility] because the staff stated that he wasnt eating the way he usually does with recent diagnosis of COVID on 03/24/2020 admitted for COVID pneumonia.   COVID pneumonia Tested positive on 03/24/2020, positive in ED. Initically with O2 sats in 80s but improved on 3L [place]. On exam saturating at 97% on room air, hypertensive to 200s, and tachycardic in 100s. Febrile to 100.9 CXR with diffuse bilateral airspace disease.  - maintain O2 sats > 90 - CRP, ferritin, ddimer - remdesivir, holding steroid in setting of possible infection from sacral and foot wounds  Sacral decubitus ulcer stage IV Patient notes worsening pain and foul odor from sacral wounds. Please see image attach above. On exam wound appears infected. Pelvic xray wnl. Lactic acid negative, no leukocytosis. Concern for systemic infection given fever, tachycardia, and hypertension. Started on Vanc, flagyl, and cefepime in ED. 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 See attached images. Concerning for osteomyelitis vs PAD. Xrays of bilateral feet without acute bony abnormality. Will get MRI - Wound care - Continue antibiotics - MRI wo contrast of both feet, ABIs - Consider consult to vascular surgery and heparin pending imaging  results  ESRD on HD States he has been on dialysis for about a month. Has tunneled dialysis catheter of right IJ. Cr. 4 on admission. Dialysis on MWF, completed full session yesterday. - Nephrology consulted appreciate recommendations - Avoid IV contrast  - monitor renal function  Anemia Hgb of 7.4 on admission, unclear baseline. Likely in the setting of CKD.  - Monitor hgb, transfuse if <7  Hypertension Patient is hypertensive to 200s. On amlodipine, hydralazine, and metoprolol at home. - restart home meds  #Protien caloric malnutrition  Diet: NPO VTE: Heparin IVF: None,10cc/hr Code: Full  Prior to Admission Living Arrangement: SNF, [name] Anticipated Discharge Location: SNF Barriers to Discharge: Medical mangement  Dispo: Admit patient to Inpatient with expected length of stay greater than 2 midnights.  Signed: Iona Beard, MD 04/29/2020, 6:15 PM  Pager: [phone]

## 2020-04-29 NOTE — ED Provider Notes (Signed)
[facility] Provider Note   CSN: [id number] Arrival date & time: 04/29/20  1103     History Chief Complaint  Patient presents with  . Shortness of Breath    Fred [name] is a 49 y.o. male with history of ESRD on HD, hypertension, diabetes, C. difficile presents to the ED from [facility] for evaluation of low oxygen saturations 82% on room air. EMS placed him on 3 L [place] and now SPO2 greater than 90. Per triage report patient tested positive for Covid on 11/30. Unvaccinated. He reports ongoing, slightly worsening cough that feels deeper and productive of green phlegm. Reports moderate to severe pain from his buttocks where he has a wound. Also reports right ankle pain and swelling. Noted bilateral feet wounds.  States he has been at [facility] for the last 3 months after he was hospitalized for "gallbladder". Has been bedbound for the last 3 months. Denies chills, chest pain, shortness of breath, vomiting, diarrhea, abdominal pain. Patient has infrequent urine, maybe once a week. Unknown last time he voided. Denies dysuria but unsure. Denies current antibiotics. Per EMS patient has been noncompliant with hypertension medicines for the last 3 days. Febrile on arrival  100.9 F. Does not use oxygen at home.  HPI     No past medical history on file.  There are no problems to display for this patient.   ** The histories are not reviewed yet. Please review them in the "History" navigator section and refresh this [place].     No family history on file.     Home Medications Prior to Admission medications   Medication Sig Start Date End Date Taking? Authorizing Provider  acetaminophen (TYLENOL) 325 MG tablet Take 650 mg by mouth every 6 (six) hours as needed for mild pain or fever (>101 F).   Yes [provider]  amLODipine (NORVASC) 10 MG tablet Take 10 mg by mouth daily.   Yes [provider]  benzonatate (TESSALON) 100 MG capsule  Take 100 mg by mouth 3 (three) times daily as needed for cough.   Yes [provider]  Cholecalciferol 25 MCG (1000 UT) tablet Take 1,000 Units by mouth daily.   Yes [provider]  epoetin alfa-epbx (RETACRIT) 69485 UNIT/ML injection Inject 10,000 Units into the skin every Monday, Wednesday, and Friday.   Yes [provider]  famotidine (PEPCID) 20 MG tablet Take 20 mg by mouth 2 (two) times daily.   Yes [provider]  hydrALAZINE (APRESOLINE) 10 MG tablet Take 10 mg by mouth every 6 (six) hours as needed (SBP>180 OR DBP>100).   Yes [provider]  insulin lispro (HUMALOG) 100 UNIT/ML injection Inject 5-15 Units into the skin 4 (four) times daily -  with meals and at bedtime. Per sliding scale 70-79= 0 units 180-200= 5 units 201-250= 8 units 251-300= 10 units 301-350= 12 units 351-400= 15 units Greater than 400 call MD   Yes [provider]  iron polysaccharides (NIFEREX) 150 MG capsule Take 150 mg by mouth daily.   Yes [provider]  Lactobacillus Rhamnosus, GG, (CULTURELLE PO) Take 1 capsule by mouth daily. 10 Billion cell capsule   Yes [provider]  metoCLOPramide (REGLAN) 5 MG tablet Take 5 mg by mouth 3 (three) times daily before meals.   Yes [provider]  metoprolol tartrate (LOPRESSOR) 50 MG tablet Take 50 mg by mouth 2 (two) times daily.   Yes [provider]  multivitamin (RENA-VIT) TABS tablet  Take 1 tablet by mouth daily.   Yes [provider]  oxyCODONE-acetaminophen (PERCOCET/ROXICET) 5-325 MG tablet Take 1 tablet by mouth every 6 (six) hours as needed for severe pain or moderate pain ((4-6) for up to 5 days).   Yes [provider]  sodium chloride 1 g tablet Take 1 g by mouth in the morning and at bedtime.   Yes [provider]  tamsulosin (FLOMAX) 0.4 MG CAPS capsule Take 0.4 mg by mouth daily.   Yes [provider]    Allergies     Atorvastatin  Review of Systems   Review of Systems  Constitutional: Positive for fever.  Respiratory: Positive for cough and shortness of breath (hypoxic).   Skin: Positive for wound.  All other systems reviewed and are negative.   Physical Exam Updated Vital Signs BP (!) 187/108   Pulse (!) 101   Temp (!) 100.9 F (38.3 C)   Resp (!) 22   Ht 6\' 6"  (1.981 m)   Wt (!) 158.8 kg   SpO2 98%   BMI 40.45 kg/m   Physical Exam Vitals and nursing note reviewed.  Constitutional:      General: He is not in acute distress.    Appearance: He is well-developed and well-nourished.     Comments: NAD. Non toxic. Flat affect.   HENT:     Head: Normocephalic and atraumatic.     Right Ear: External ear normal.     Left Ear: External ear normal.     Nose: Nose normal.  Eyes:     General: No scleral icterus.    Extraocular Movements: EOM normal.     Conjunctiva/sclera: Conjunctivae normal.  Cardiovascular:     Rate and Rhythm: Regular rhythm. Tachycardia present.     Pulses: Intact distal pulses.     Heart sounds: Normal heart sounds. No murmur heard.     Comments: HR in the low 100s during exam. No LE edema. No calf tenderness.  Pulmonary:     Effort: Pulmonary effort is normal.     Breath sounds: Decreased breath sounds present.     Comments: On supplemental oxygen via [place] 3 L. Speaking in full sentences. Diminished air sounds lower lobes, difficult exam due to body habitus. No wheezing, crackles.  Abdominal:     Palpations: Abdomen is soft.     Tenderness: There is no abdominal tenderness.  Musculoskeletal:        General: No deformity. Normal range of motion.     Cervical back: Normal range of motion and neck supple.     Right ankle: Swelling present.     Comments: Diffuse right ankle edema. No erythema, warmth. Full ROM of ankle without pain. No calf tenderness.   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 Left 5th toe with minimal dry gangrene, non tender.   Skin:     General: Skin is warm and dry.     Capillary Refill: Capillary refill takes less than 2 seconds.     Findings: Wound present.     Comments:  Right buttock/sacral wound malodorous with cellulitis/erythema extending up into gluteal cleft and left buttock measuring approx 7 x 5 cm   Right foot heel wound 7 x 4 cm, non tender. Scant yellow drainage. Circumferential erythema. Black/necrotic dry center  Right foot wound at base of 5th MTP with black/necrotic dry center, circumferential erythema, scant yellow drainage. Non tendern  Left foot wound at base of 5th MTP with necrotic dry center, circumferential erythema.  No fluctuance, non tender.  Left lateral ankle wound approx 1 x 1 cm, superficial, erythematous, tender. No fluctuance or drainage.   Neurological:     Mental Status: He is alert and oriented to person, place, and time.  Psychiatric:        Mood and Affect: Mood and affect normal.        Behavior: Behavior normal.        Thought Content: Thought content normal.        Judgment: Judgment normal.     ED Results / Procedures / Treatments   Labs (all labs ordered are listed, but only abnormal results are displayed) Labs Reviewed  RESP PANEL BY RT-PCR (FLU A&B, COVID) ARPGX2 - Abnormal; Notable for the following components:      Result Value   SARS Coronavirus 2 by RT PCR POSITIVE (*)    All other components within normal limits  COMPREHENSIVE METABOLIC PANEL - Abnormal; Notable for the following components:   Creatinine, Ser 4.41 (*)    Calcium 7.3 (*)    Total Protein 6.0 (*)    Albumin 1.6 (*)    AST 46 (*)    Alkaline Phosphatase 186 (*)    GFR, Estimated 16 (*)    All other components within normal limits  CBC WITH DIFFERENTIAL/PLATELET - Abnormal; Notable for the following components:   RBC 3.13 (*)    Hemoglobin 7.8 (*)    HCT 27.1 (*)    MCH 24.9 (*)    MCHC 28.8 (*)    RDW 16.4 (*)    Lymphs Abs 0.0 (*)    All other components within normal limits  APTT -  Abnormal; Notable for the following components:   aPTT 38 (*)    All other components within normal limits  CULTURE, BLOOD (ROUTINE X 2)  CULTURE, BLOOD (ROUTINE X 2)  URINE CULTURE  LACTIC ACID, PLASMA  PROTIME-INR  LACTIC ACID, PLASMA  URINALYSIS, ROUTINE W REFLEX MICROSCOPIC  POC OCCULT BLOOD, ED    EKG EKG Interpretation  Date/Time:  Thursday April 29 2020 11:05:03 EST Ventricular Rate:  103 PR Interval:    QRS Duration: 101 QT Interval:  361 QTC Calculation: 473 R Axis:   61 Text Interpretation: Sinus tachycardia Confirmed by Gerlene Fee [phone]) on 04/29/2020 11:13:56 AM   Radiology DG Ankle 2 Views Right  Result Date: 04/29/2020 CLINICAL DATA:  Bilateral foot wounds EXAM: RIGHT ANKLE - 2 VIEW COMPARISON:  Right foot series today FINDINGS: Diffuse dense vascular calcifications. No acute bony abnormality. Specifically, no fracture, subluxation, or dislocation. No bone destruction. Soft tissues are intact. Plantar calcaneal spur. IMPRESSION: No acute bony abnormality. Electronically Signed   By: Rolm Baptise M.D.   On: 04/29/2020 13:40   DG Pelvis Portable  Result Date: 04/29/2020 CLINICAL DATA:  Sacral wounds EXAM: PORTABLE PELVIS 1-2 VIEWS COMPARISON:  None. FINDINGS: No acute bony abnormality. Specifically, no fracture, subluxation, or dislocation. No bone destruction. Diffuse vascular calcifications. IMPRESSION: No acute bony abnormality. Electronically Signed   By: Rolm Baptise M.D.   On: 04/29/2020 13:43   DG Chest Port 1 View  Result Date: 04/29/2020 CLINICAL DATA:  Question sepsis. EXAM: PORTABLE CHEST 1 VIEW COMPARISON:  None. FINDINGS: Heart size upper normal. Diffuse bilateral airspace disease with patchy airspace disease right greater than left. No effusion. Right jugular dual lumen catheter tip in the SVC at the cavoatrial junction. No pneumothorax IMPRESSION: Diffuse bilateral airspace disease most likely pneumonia. Correlate with COVID-19 status.  Electronically Signed  By: Franchot Gallo M.D.   On: 04/29/2020 12:09   DG Foot 2 Views Left  Result Date: 04/29/2020 CLINICAL DATA:  Bilateral foot wounds EXAM: LEFT FOOT - 2 VIEW COMPARISON:  None. FINDINGS: Deformity of the 3rd through 5th metatarsals, likely related to old injury. There appears to be fusion across the 2nd through 5th tarsal metatarsal joints. No acute fracture, subluxation or dislocation. No bone destruction. Diffuse vascular calcifications. Soft tissues intact. IMPRESSION: Deformity of the 2nd through 5th metatarsals with fusion across the 2nd through 5th tarsal metatarsal joints, possibly related to old trauma. No acute bony abnormality. Electronically Signed   By: Rolm Baptise M.D.   On: 04/29/2020 13:42   DG Foot 2 Views Right  Result Date: 04/29/2020 CLINICAL DATA:  Bilateral feet wounds EXAM: RIGHT FOOT - 2 VIEW COMPARISON:  None. FINDINGS: Diffuse dense vascular calcifications. No acute bony abnormality. Specifically, no fracture, subluxation, or dislocation. No bone destruction. Soft tissues are intact. Plantar calcaneal spur. IMPRESSION: No acute bony abnormality. Electronically Signed   By: Rolm Baptise M.D.   On: 04/29/2020 13:40    Procedures .Critical Care Performed by: Kinnie Feil, PA-C Authorized by: Kinnie Feil, PA-C   Critical care provider statement:    Critical care time (minutes):  45   Critical care was necessary to treat or prevent imminent or life-threatening deterioration of the following conditions:  Sepsis   Critical care was time spent personally by me on the following activities:  Discussions with consultants, evaluation of patient's response to treatment, examination of patient, ordering and performing treatments and interventions, ordering and review of laboratory studies, ordering and review of radiographic studies, pulse oximetry, re-evaluation of patient's condition, obtaining history from patient or surrogate, review of old  charts and development of treatment plan with patient or surrogate   I assumed direction of critical care for this patient from another provider in my specialty: no   Ultrasound ED Peripheral IV (Provider)  Date/Time: 04/29/2020 8:11 PM Performed by: Kinnie Feil, PA-C Authorized by: Kinnie Feil, PA-C   Procedure details:    Indications: multiple failed IV attempts and poor IV access     Skin Prep: chlorhexidine gluconate     Location: right upper arm.   Angiocath:  20 G   Bedside Ultrasound Guided: Yes     Images: not archived     Patient tolerated procedure without complications: Yes     Dressing applied: Yes     (including critical care time)  Medications Ordered in ED Medications  vancomycin (VANCOCIN) 2,500 mg in sodium chloride 0.9 % 500 mL IVPB (2,500 mg Intravenous New Bag/Given 04/29/20 1402)  fentaNYL (SUBLIMAZE) injection 100 mcg (100 mcg Intravenous Not Given 04/29/20 1240)  ceFEPIme (MAXIPIME) 1 g in sodium chloride 0.9 % 100 mL IVPB (has no administration in time range)  vancomycin variable dose per unstable renal function (pharmacist dosing) (has no administration in time range)  acetaminophen (TYLENOL) tablet 650 mg (650 mg Oral Given 04/29/20 1240)  ceFEPIme (MAXIPIME) 2 g in sodium chloride 0.9 % 100 mL IVPB (0 g Intravenous Stopped 04/29/20 1354)  metroNIDAZOLE (FLAGYL) IVPB 500 mg (0 mg Intravenous Stopped 04/29/20 1354)    ED Course  I have reviewed the triage vital signs and the nursing notes.  Pertinent labs & imaging results that were available during my care of the patient were reviewed by me and considered in my medical decision making (see chart for details).  Clinical Course as of 04/29/20  1409  Thu Apr 29, 2020  1219 DG Chest Port 1 View IMPRESSION: Diffuse bilateral airspace disease most likely pneumonia. Correlate with COVID-19 status. [CG]  1219 Temp(!): 100.9 F (38.3 C) [CG]  1219 Pulse Rate(!): 101 [CG]  1219 Resp(!): 21 [CG]   1219 SpO2: 100 % On 3 L [place]  [CG]  1249 Hemoglobin(!): 7.8 [CG]  1249 Creatinine(!): 4.41 [CG]  1249 Alkaline Phosphatase(!): 186 [CG]  1249 Anion gap: 10 [CG]  1249 AST(!): 46 [CG]  1249 Lactic Acid, Venous: 0.9 [CG]  1304 Patient gave verbal consent to speak to sister. Called sister. Reports patient has had "mental" changes, refusing medicines, refusing food for last 3 days. Reports patient has been "odd" all his life, ?Asperger's? Was functional, worked, Social research officer, government. Last year began declining. Sick on and off. September was septic, kidney failure, started HD. Source was GB, removed JP drain, several antibiotics. H/o C.diff. Patient originally from [place]. Went to [facility], started walking. Then got sick again, discharged to [facility]. Been there for 3 weeks. History of anemia on iron supplements. Has had a cough since GB surgery.  [CG]    Clinical Course User Index [CG] Arlean Hopping   MDM Rules/Calculators/A&P                          EMR triage and nursing notes reviewed  No medical records available  Patient meets SIRS/Sepsis criteria.  Arrives febrile with mild tachycardia, tachypnea, hypoxic. Sacral wound with odor, drainage, cellulitis, exquisitely tender. No palpable abscess. 4 chronic appearing wounds bilateral feet with black/necrotic center, left 4th toe with dry gangrene, 5th is erythematous. Covid positive reportedly on 11/30.   Ddx of fever could be several things including COVID, superimposed pneumonia. Sacral wound appears acutely infection. No obvious abscess noted on areas with wound. Concern for osteomyelitis vs gangrene. He denies GI symptoms, GU symptoms. Hypoxic could be from PE given COVID status, however no CP.   Labs, imaging ordered as above. Sepsis order set utilized. Imaging of areas with wounds ordered. CXR. EKG. Blood cultures.   1415: ER work up personally visualized and interpreted  No previous labs, imaging available. Unknown patient's  baseline creatinine, hemoglobin, etc.  Lab work remarkable for - hemoglobin 8.3, HCT 27.8. Hemoccult negative with brown stool. Normal WBC and lactic acid. Creatinine 4.62, unknown baseline.   Imaging reveals - CXR with findings consistent with COVID status. Pelvis/sacral and bilateral feet x-rays without obvious air, bone destruction.   Medicines given - tylenol, fentanyl and cefepime, flagyl, vanc renal dosed per pharmacy. Given COVID status, hypoxic and HD will hold off on IVF. Hypertensive at this time. Well appearing overall. Unknown baseline creatinine but no other signs of EOD, hypotension to warrant large volume IVF resuscitation.   Care transferred to oncoming EDP who will admit patient. Consider VQ scan, MRIs for further evaluation of wound extension.   Shared with EDP.  Final Clinical Impression(s) / ED Diagnoses Final diagnoses:  Multiple open wounds of foot  Bedbound  Wound of foot  Sacral wound    Rx / DC Orders ED Discharge Orders    None       Arlean Hopping 04/29/20 2012    Maudie Flakes, MD 05/03/20 [phone]

## 2020-04-30 ENCOUNTER — Inpatient Hospital Stay (HOSPITAL_COMMUNITY): Payer: Medicare Other

## 2020-04-30 ENCOUNTER — Inpatient Hospital Stay (HOSPITAL_BASED_OUTPATIENT_CLINIC_OR_DEPARTMENT_OTHER): Payer: Medicare Other

## 2020-04-30 DIAGNOSIS — S91302A Unspecified open wound, left foot, initial encounter: Secondary | ICD-10-CM

## 2020-04-30 DIAGNOSIS — I10 Essential (primary) hypertension: Secondary | ICD-10-CM

## 2020-04-30 DIAGNOSIS — Z992 Dependence on renal dialysis: Secondary | ICD-10-CM

## 2020-04-30 DIAGNOSIS — I96 Gangrene, not elsewhere classified: Secondary | ICD-10-CM

## 2020-04-30 DIAGNOSIS — D649 Anemia, unspecified: Secondary | ICD-10-CM

## 2020-04-30 DIAGNOSIS — M869 Osteomyelitis, unspecified: Secondary | ICD-10-CM

## 2020-04-30 DIAGNOSIS — S91309A Unspecified open wound, unspecified foot, initial encounter: Secondary | ICD-10-CM | POA: Insufficient documentation

## 2020-04-30 DIAGNOSIS — L89154 Pressure ulcer of sacral region, stage 4: Secondary | ICD-10-CM

## 2020-04-30 DIAGNOSIS — S91301A Unspecified open wound, right foot, initial encounter: Secondary | ICD-10-CM

## 2020-04-30 DIAGNOSIS — M87076 Idiopathic aseptic necrosis of unspecified foot: Secondary | ICD-10-CM

## 2020-04-30 DIAGNOSIS — R52 Pain, unspecified: Secondary | ICD-10-CM

## 2020-04-30 DIAGNOSIS — L89150 Pressure ulcer of sacral region, unstageable: Secondary | ICD-10-CM

## 2020-04-30 LAB — COMPREHENSIVE METABOLIC PANEL
ALT: 19 U/L (ref 0–44)
AST: 41 U/L (ref 15–41)
Albumin: 1.5 g/dL — ABNORMAL LOW (ref 3.5–5.0)
Alkaline Phosphatase: 171 U/L — ABNORMAL HIGH (ref 38–126)
Anion gap: 9 (ref 5–15)
BUN: 15 mg/dL (ref 6–20)
CO2: 26 mmol/L (ref 22–32)
Calcium: 7.2 mg/dL — ABNORMAL LOW (ref 8.9–10.3)
Chloride: 102 mmol/L (ref 98–111)
Creatinine, Ser: 4.83 mg/dL — ABNORMAL HIGH (ref 0.61–1.24)
GFR, Estimated: 14 mL/min — ABNORMAL LOW (ref >60)
Glucose, Bld: 80 mg/dL (ref 70–99)
Potassium: 3.6 mmol/L (ref 3.5–5.1)
Sodium: 137 mmol/L (ref 135–145)
Total Bilirubin: 0.8 mg/dL (ref 0.3–1.2)
Total Protein: 6 g/dL — ABNORMAL LOW (ref 6.5–8.1)

## 2020-04-30 LAB — CBC WITH DIFFERENTIAL/PLATELET
Abs Immature Granulocytes: 0.07 10*3/uL (ref 0.00–0.07)
Basophils Absolute: 0 10*3/uL (ref 0.0–0.1)
Basophils Relative: 0 %
Eosinophils Absolute: 0.1 10*3/uL (ref 0.0–0.5)
Eosinophils Relative: 1 %
HCT: 30.3 % — ABNORMAL LOW (ref 39.0–52.0)
Hemoglobin: 8.7 g/dL — ABNORMAL LOW (ref 13.0–17.0)
Immature Granulocytes: 1 %
Lymphocytes Relative: 14 %
Lymphs Abs: 0.9 10*3/uL (ref 0.7–4.0)
MCH: 24.8 pg — ABNORMAL LOW (ref 26.0–34.0)
MCHC: 28.7 g/dL — ABNORMAL LOW (ref 30.0–36.0)
MCV: 86.3 fL (ref 80.0–100.0)
Monocytes Absolute: 0.4 10*3/uL (ref 0.1–1.0)
Monocytes Relative: 6 %
Neutro Abs: 5.2 10*3/uL (ref 1.7–7.7)
Neutrophils Relative %: 78 %
Platelets: 227 10*3/uL (ref 150–400)
RBC: 3.51 MIL/uL — ABNORMAL LOW (ref 4.22–5.81)
RDW: 16.5 % — ABNORMAL HIGH (ref 11.5–15.5)
WBC: 6.5 10*3/uL (ref 4.0–10.5)
nRBC: 0 % (ref 0.0–0.2)

## 2020-04-30 LAB — CBG MONITORING, ED
Glucose-Capillary: 110 mg/dL — ABNORMAL HIGH (ref 70–99)
Glucose-Capillary: 74 mg/dL (ref 70–99)
Glucose-Capillary: 75 mg/dL (ref 70–99)
Glucose-Capillary: 78 mg/dL (ref 70–99)
Glucose-Capillary: 86 mg/dL (ref 70–99)

## 2020-04-30 LAB — PROCALCITONIN: Procalcitonin: 1.07 ng/mL

## 2020-04-30 LAB — C-REACTIVE PROTEIN: CRP: 12.7 mg/dL — ABNORMAL HIGH (ref <1.0)

## 2020-04-30 LAB — D-DIMER, QUANTITATIVE: D-Dimer, Quant: 2.48 ug/mL-FEU — ABNORMAL HIGH (ref 0.00–0.50)

## 2020-04-30 LAB — PHOSPHORUS: Phosphorus: 2.6 mg/dL (ref 2.5–4.6)

## 2020-04-30 LAB — MAGNESIUM: Magnesium: 1.6 mg/dL — ABNORMAL LOW (ref 1.7–2.4)

## 2020-04-30 LAB — FERRITIN: Ferritin: 1346 ng/mL — ABNORMAL HIGH (ref 24–336)

## 2020-04-30 MED ORDER — AMLODIPINE BESYLATE 10 MG PO TABS
10.0000 mg | ORAL_TABLET | Freq: Every day | ORAL | Status: DC
  Administered 2020-05-01 – 2020-05-24 (×22): 10 mg via ORAL
  Filled 2020-04-30 (×26): qty 1

## 2020-04-30 MED ORDER — MAGNESIUM SULFATE 2 GM/50ML IV SOLN
2.0000 g | Freq: Once | INTRAVENOUS | Status: AC
  Administered 2020-04-30: 2 g via INTRAVENOUS
  Filled 2020-04-30: qty 50

## 2020-04-30 MED ORDER — VANCOMYCIN HCL IN DEXTROSE 1-5 GM/200ML-% IV SOLN
1000.0000 mg | INTRAVENOUS | Status: DC
  Administered 2020-05-05 – 2020-05-12 (×3): 1000 mg via INTRAVENOUS
  Filled 2020-04-30 (×5): qty 200

## 2020-04-30 MED ORDER — COLLAGENASE 250 UNIT/GM EX OINT
TOPICAL_OINTMENT | Freq: Two times a day (BID) | CUTANEOUS | Status: DC
  Administered 2020-05-08: 1 via TOPICAL
  Filled 2020-04-30 (×3): qty 30

## 2020-04-30 MED ORDER — SODIUM CHLORIDE 0.9 % IV SOLN
1.0000 g | INTRAVENOUS | Status: AC
  Administered 2020-04-30 – 2020-05-17 (×17): 1 g via INTRAVENOUS
  Filled 2020-04-30 (×21): qty 1

## 2020-04-30 MED ORDER — VANCOMYCIN HCL IN DEXTROSE 1-5 GM/200ML-% IV SOLN
INTRAVENOUS | Status: AC
  Administered 2020-04-30: 1000 mg via INTRAVENOUS
  Filled 2020-04-30: qty 200

## 2020-04-30 MED ORDER — CHLORHEXIDINE GLUCONATE CLOTH 2 % EX PADS
6.0000 | MEDICATED_PAD | Freq: Every day | CUTANEOUS | Status: DC
  Administered 2020-05-01 – 2020-05-21 (×13): 6 via TOPICAL

## 2020-04-30 NOTE — ED Notes (Signed)
Dinner Tray Ordered @ 1744. 

## 2020-04-30 NOTE — ED Notes (Signed)
All medication were due when pt was at dialysis

## 2020-04-30 NOTE — H&P (Addendum)
ABI completed.  Abnormal results relayed to RN  Please see CV Proc for preliminary results.   Vonzell Schlatter, RVT

## 2020-04-30 NOTE — Consult Note (Addendum)
[facility] Renal Consultation Note    Indication for Consultation:  Management of ESRD/hemodialysis; anemia, hypertension/volume and secondary hyperparathyroidism  PCP:Pcp, No  HPI: Fred [name] is a 49 y.o. male with ESRD on HD TTS at [facility]. Past medical history significant for Diabetes mellitus, HTN, HLD and ulcer R foot.  Per outpatient chart patient recently transferred to [facility] on 04/06/20 while he is in a SNF in [place].  Had been on dialysis for 1.90months prior to transfer in [facility].  Of note patient has been leaving under edw each HD and has been shortening treatments, usually staying 2-3hrs.   Patient seen and examined today in isolation during dialysis.  Tolerating HD well so far using TDC.  Majority of history collect from chart review due to altered mental status. When asked current month "mid October", when asked year "I dont know", when asked place "I am not exactly sure."  Denies CP, SOB, n/v, weakness, dizziness and fatigue.  Reports he no longer makes urine or "not enough to matter."  Admits to chronic diarrhea.    [facility] sent him to the ED due to low O2 saturations.  He is unvaccinated and tested positive for COVID on 11/30.  Sent to SNF following hospitalization for gallbladder surgery.   Pertinent findings in the ED include tachycardia, tmax 100.9, hypoxia requiring 3L O2 via [place], COVID+, CXR showing diffuse bilateral airspace disease most likely pneumonia; sacral decub with MRI pelvis suspicious for coccygeal osteomyelitis in the presence of overlying soft tissue sacral ulceration and cellulitis; MRI L foot with findings suggestive of osteomyelitis of 1st digit.  Patient has been admitted for further evaluation and management.      Social History:  has no history on file for tobacco use, alcohol use, and drug use. Allergies  Allergen Reactions  . Atorvastatin Other (See Comments)    NOT on MAR   Prior to Admission medications   Medication Sig  Start Date End Date Taking? Authorizing Provider  acetaminophen (TYLENOL) 325 MG tablet Take 650 mg by mouth every 6 (six) hours as needed for mild pain or fever (>101 F).   Yes [provider]  amLODipine (NORVASC) 10 MG tablet Take 10 mg by mouth daily.   Yes [provider]  benzonatate (TESSALON) 100 MG capsule Take 100 mg by mouth 3 (three) times daily as needed for cough.   Yes [provider]  Cholecalciferol 25 MCG (1000 UT) tablet Take 1,000 Units by mouth daily.   Yes [provider]  epoetin alfa-epbx (RETACRIT) 60737 UNIT/ML injection Inject 10,000 Units into the skin every Monday, Wednesday, and Friday.   Yes [provider]  famotidine (PEPCID) 20 MG tablet Take 20 mg by mouth 2 (two) times daily.   Yes [provider]  hydrALAZINE (APRESOLINE) 10 MG tablet Take 10 mg by mouth every 6 (six) hours as needed (SBP>180 OR DBP>100).   Yes [provider]  insulin lispro (HUMALOG) 100 UNIT/ML injection Inject 5-15 Units into the skin 4 (four) times daily -  with meals and at bedtime. Per sliding scale 70-79= 0 units 180-200= 5 units 201-250= 8 units 251-300= 10 units 301-350= 12 units 351-400= 15 units Greater than 400 call MD   Yes [provider]  iron polysaccharides (NIFEREX) 150 MG capsule Take 150 mg by mouth daily.   Yes [provider]  Lactobacillus Rhamnosus, GG, (CULTURELLE PO) Take 1 capsule by mouth daily. 10 Billion cell capsule   Yes [provider]  metoCLOPramide (REGLAN) 5 MG tablet Take 5 mg by mouth 3 (three) times daily before meals.   Yes [provider]  metoprolol tartrate (LOPRESSOR) 50 MG tablet Take 50 mg by mouth 2 (two) times daily.   Yes [provider]  multivitamin (RENA-VIT) TABS tablet Take 1 tablet by mouth daily.   Yes [provider]  oxyCODONE-acetaminophen (PERCOCET/ROXICET) 5-325 MG tablet Take 1 tablet by mouth every 6 (six) hours  as needed for severe pain or moderate pain ((4-6) for up to 5 days).   Yes [provider]  sodium chloride 1 g tablet Take 1 g by mouth in the morning and at bedtime.   Yes [provider]  tamsulosin (FLOMAX) 0.4 MG CAPS capsule Take 0.4 mg by mouth daily.   Yes [provider]   Current Facility-Administered Medications  Medication Dose Route Frequency Provider Last Rate Last Admin  . acetaminophen (TYLENOL) tablet 650 mg  650 mg Oral Q6H PRN Marianna Payment, MD      . amLODipine (NORVASC) tablet 10 mg  10 mg Oral Daily Marianna Payment, MD   10 mg at 04/29/20 1922  . ceFEPIme (MAXIPIME) 1 g in sodium chloride 0.9 % 100 mL IVPB  1 g Intravenous Q24H Bertis Ruddy, RPH      . Chlorhexidine Gluconate Cloth 2 % PADS 6 each  6 each Topical Q0600 Penninger, Ria Comment, [place]      . collagenase (SANTYL) ointment   Topical BID Maczis, Barth Kirks, PA-C      . famotidine (PEPCID) tablet 20 mg  20 mg Oral BID Marianna Payment, MD   20 mg at 04/30/20 0038  . fentaNYL (SUBLIMAZE) injection 100 mcg  100 mcg Intravenous Once Kinnie Feil, PA-C      . heparin injection 5,000 Units  5,000 Units Subcutaneous Q8H Marianna Payment, MD   5,000 Units at 04/30/20 0654  . insulin aspart (novoLOG) injection 0-15 Units  0-15 Units Subcutaneous Q4H Marianna Payment, MD      . metoCLOPramide (REGLAN) tablet 5 mg  5 mg Oral TID Moise Boring, MD      . ondansetron [facility]) tablet 4 mg  4 mg Oral Q6H PRN Marianna Payment, MD       Or  . ondansetron [facility]) injection 4 mg  4 mg Intravenous Q6H PRN Marianna Payment, MD      . oxyCODONE-acetaminophen (PERCOCET/ROXICET) 5-325 MG per tablet 1 tablet  1 tablet Oral Q6H PRN Marianna Payment, MD      . remdesivir 100 mg in sodium chloride 0.9 % 100 mL IVPB  100 mg Intravenous Daily Marianna Payment, MD      . senna (SENOKOT) tablet 8.6 mg  1 tablet Oral BID Marianna Payment, MD   8.6 mg at 04/30/20 0038  . sodium chloride flush (NS) 0.9 % injection 3 mL  3 mL Intravenous  Q12H Marianna Payment, MD   3 mL at 04/30/20 0041  . vancomycin (VANCOCIN) 1-5 GM/200ML-% IVPB           . vancomycin (VANCOCIN) IVPB 1000 mg/200 mL premix  1,000 mg Intravenous Q M,W,F-HD Bertis Ruddy, [facility]       Current Outpatient Medications  Medication Sig Dispense Refill  . acetaminophen (TYLENOL) 325 MG tablet Take 650 mg by mouth every 6 (six) hours as needed for mild pain or fever (>101 F).    Marland Kitchen amLODipine (NORVASC) 10 MG tablet Take 10 mg by mouth daily.    . benzonatate (TESSALON) 100 MG  capsule Take 100 mg by mouth 3 (three) times daily as needed for cough.    . Cholecalciferol 25 MCG (1000 UT) tablet Take 1,000 Units by mouth daily.    Marland Kitchen epoetin alfa-epbx (RETACRIT) 73428 UNIT/ML injection Inject 10,000 Units into the skin every Monday, Wednesday, and Friday.    . famotidine (PEPCID) 20 MG tablet Take 20 mg by mouth 2 (two) times daily.    . hydrALAZINE (APRESOLINE) 10 MG tablet Take 10 mg by mouth every 6 (six) hours as needed (SBP>180 OR DBP>100).    . insulin lispro (HUMALOG) 100 UNIT/ML injection Inject 5-15 Units into the skin 4 (four) times daily -  with meals and at bedtime. Per sliding scale 70-79= 0 units 180-200= 5 units 201-250= 8 units 251-300= 10 units 301-350= 12 units 351-400= 15 units Greater than 400 call MD    . iron polysaccharides (NIFEREX) 150 MG capsule Take 150 mg by mouth daily.    . Lactobacillus Rhamnosus, GG, (CULTURELLE PO) Take 1 capsule by mouth daily. 10 Billion cell capsule    . metoCLOPramide (REGLAN) 5 MG tablet Take 5 mg by mouth 3 (three) times daily before meals.    . metoprolol tartrate (LOPRESSOR) 50 MG tablet Take 50 mg by mouth 2 (two) times daily.    . multivitamin (RENA-VIT) TABS tablet Take 1 tablet by mouth daily.    Marland Kitchen oxyCODONE-acetaminophen (PERCOCET/ROXICET) 5-325 MG tablet Take 1 tablet by mouth every 6 (six) hours as needed for severe pain or moderate pain ((4-6) for up to 5 days).    . sodium chloride 1 g tablet Take 1 g by  mouth in the morning and at bedtime.    . tamsulosin (FLOMAX) 0.4 MG CAPS capsule Take 0.4 mg by mouth daily.     Labs: Basic Metabolic Panel: Recent Labs  Lab 04/29/20 1214 04/29/20 1859 04/30/20 0336  NA 137 135 137  K 3.8 3.5 3.6  CL 101 102 102  CO2 26 23 26   GLUCOSE 84 80 80  BUN 11 15 15   CREATININE 4.41* 4.62* 4.83*  CALCIUM 7.3* 7.2* 7.2*  PHOS  --   --  2.6   Liver Function Tests: Recent Labs  Lab 04/29/20 1214 04/29/20 1859 04/30/20 0336  AST 46* 41 41  ALT 21 18 19   ALKPHOS 186* 177* 171*  BILITOT 0.5 0.7 0.8  PROT 6.0* 6.0* 6.0*  ALBUMIN 1.6* 1.5* 1.5*   CBC: Recent Labs  Lab 04/29/20 1214 04/29/20 1859 04/30/20 0336  WBC 5.8 5.7 6.5  NEUTROABS 5.7 5.4 5.2  HGB 7.8* 8.3* 8.7*  HCT 27.1* 27.8* 30.3*  MCV 86.6 85.8 86.3  PLT 251 242 227   CBG: Recent Labs  Lab 04/29/20 2307 04/30/20 0345 04/30/20 0934  GLUCAP 82 74 75   Iron Studies:  Recent Labs    04/30/20 0336  FERRITIN 1,346*   Studies/Results: DG Ankle 2 Views Right  Result Date: 04/29/2020 CLINICAL DATA:  Bilateral foot wounds EXAM: RIGHT ANKLE - 2 VIEW COMPARISON:  Right foot series today FINDINGS: Diffuse dense vascular calcifications. No acute bony abnormality. Specifically, no fracture, subluxation, or dislocation. No bone destruction. Soft tissues are intact. Plantar calcaneal spur. IMPRESSION: No acute bony abnormality. Electronically Signed   By: Rolm Baptise M.D.   On: 04/29/2020 13:40   MR PELVIS WO CONTRAST  Result Date: 04/29/2020 CLINICAL DATA:  Osteomyelitis suspected, pelvis, no prior imaging EXAM: MRI PELVIS WITHOUT CONTRAST TECHNIQUE: Multiplanar multisequence MR imaging of the pelvis was performed. No intravenous  contrast was administered. COMPARISON:  None. FINDINGS: Urinary Tract:  No abnormality visualized. Bowel:  Unremarkable visualized pelvic bowel loops. Vascular/Lymphatic: No pathologically enlarged lymph nodes. No significant vascular abnormality seen.  Reproductive:  No mass or other significant abnormality Other: Subcutaneous soft tissue hyperintensity of the gluteal subcutaneus soft tissues within associated skin defect chest left of midline at the level of the distal sacrum and coccyx (10:38). Slight hyperintensity of bilateral gluteus maximus musculature. Underlying the ulceration along the left gluteal soft tissues, there are a couple foci of gas within the musculature and soft tissues. No organized fluid collection. No definite deep fascial edema. Musculoskeletal: Hyperintense first coccyx vertebra on T2 fat saturation (11:35) with vague decreased intensity on T1 image (9:10). IMPRESSION: Findings suspicious for coccygeal osteomyelitis in the presence of overlying soft tissue sacral ulceration and cellulitis. Electronically Signed   By: Iven Finn M.D.   On: 04/29/2020 21:36   DG Pelvis Portable  Result Date: 04/29/2020 CLINICAL DATA:  Sacral wounds EXAM: PORTABLE PELVIS 1-2 VIEWS COMPARISON:  None. FINDINGS: No acute bony abnormality. Specifically, no fracture, subluxation, or dislocation. No bone destruction. Diffuse vascular calcifications. IMPRESSION: No acute bony abnormality. Electronically Signed   By: Rolm Baptise M.D.   On: 04/29/2020 13:43   MR FOOT LEFT WO CONTRAST  Result Date: 04/29/2020 CLINICAL DATA:  Foot pain, question of infection EXAM: MRI OF THE LEFT FOOT WITHOUT CONTRAST TECHNIQUE: Multiplanar, multisequence MR imaging of the left was performed. No intravenous contrast was administered. COMPARISON:  None. FINDINGS: Bones/Joint/Cartilage There is diffuse periosteal thickening with ankylosis seen at the base of the second through fifth digits. Joint space loss seen at the fourth and fifth metatarsal cuboid joint with subchondral cystic changes. There is a T2 bright/T1 dark serpiginous area seen within the first metatarsal shaft which could represent a vascular necrosis. No area cortical destruction or periosteal reaction is  noted. There is also a T2 bright/T1 dark area of signal change seen at the distal tuft of the first digit. Muscles and Tendons Increased signal with fatty atrophy of the muscles is seen. The flexor and extensor tendons are intact. The plantar fascia is intact. Soft tissues Area of superficial ulceration seen the dorsum of the first digit with nailbed irregularity. No loculated fluid collection or sinus tract. IMPRESSION: Area of ulceration with nailbed irregularity at the first digit with findings that are suggestive of osteomyelitis involving the first digit distal tuft. No loculated fluid collections or sinus tract. Area of signal abnormality in the first metatarsal shaft which could be due to avascular necrosis/reactive marrow. Electronically Signed   By: Prudencio Pair M.D.   On: 04/29/2020 21:50   DG Chest Port 1 View  Result Date: 04/29/2020 CLINICAL DATA:  Question sepsis. EXAM: PORTABLE CHEST 1 VIEW COMPARISON:  None. FINDINGS: Heart size upper normal. Diffuse bilateral airspace disease with patchy airspace disease right greater than left. No effusion. Right jugular dual lumen catheter tip in the SVC at the cavoatrial junction. No pneumothorax IMPRESSION: Diffuse bilateral airspace disease most likely pneumonia. Correlate with COVID-19 status. Electronically Signed   By: Franchot Gallo M.D.   On: 04/29/2020 12:09   DG Foot 2 Views Left  Result Date: 04/29/2020 CLINICAL DATA:  Bilateral foot wounds EXAM: LEFT FOOT - 2 VIEW COMPARISON:  None. FINDINGS: Deformity of the 3rd through 5th metatarsals, likely related to old injury. There appears to be fusion across the 2nd through 5th tarsal metatarsal joints. No acute fracture, subluxation or dislocation. No bone destruction. Diffuse  vascular calcifications. Soft tissues intact. IMPRESSION: Deformity of the 2nd through 5th metatarsals with fusion across the 2nd through 5th tarsal metatarsal joints, possibly related to old trauma. No acute bony abnormality.  Electronically Signed   By: Rolm Baptise M.D.   On: 04/29/2020 13:42   DG Foot 2 Views Right  Result Date: 04/29/2020 CLINICAL DATA:  Bilateral feet wounds EXAM: RIGHT FOOT - 2 VIEW COMPARISON:  None. FINDINGS: Diffuse dense vascular calcifications. No acute bony abnormality. Specifically, no fracture, subluxation, or dislocation. No bone destruction. Soft tissues are intact. Plantar calcaneal spur. IMPRESSION: No acute bony abnormality. Electronically Signed   By: Rolm Baptise M.D.   On: 04/29/2020 13:40   VAS [place] ABI WITH/WO TBI  Result Date: 04/30/2020 LOWER EXTREMITY DOPPLER STUDY Indications: Rest pain, and gangrene. High Risk Factors: Hypertension, Diabetes.  Comparison Study: No previous exam Performing Technologist: Vonzell Schlatter RVT  Examination Guidelines: A complete evaluation includes at minimum, Doppler waveform signals and systolic blood pressure reading at the level of bilateral brachial, anterior tibial, and posterior tibial arteries, when vessel segments are accessible. Bilateral testing is considered an integral part of a complete examination. Photoelectric Plethysmograph (PPG) waveforms and toe systolic pressure readings are included as required and additional duplex testing as needed. Limited examinations for reoccurring indications may be performed as noted.  ABI Findings: +--------+------------------+-----+---------+--------+ Right   Rt Pressure (mmHg)IndexWaveform Comment  +--------+------------------+-----+---------+--------+ [id number]                                      +--------+------------------+-----+---------+--------+ PTA     0                 0.00 absent            +--------+------------------+-----+---------+--------+ DP      253               1.10 triphasic         +--------+------------------+-----+---------+--------+ +--------+------------------+-----+--------+-------+ Left    Lt Pressure (mmHg)IndexWaveformComment  +--------+------------------+-----+--------+-------+ [id number]                                    +--------+------------------+-----+--------+-------+ PTA     0                 0.00 absent          +--------+------------------+-----+--------+-------+ DP      183               0.80 biphasic        +--------+------------------+-----+--------+-------+  Summary: Right: Resting right ankle-brachial index is within normal range. No evidence of significant right lower extremity arterial disease. Unable to obtain doppler or waveform of PTA. Left: Resting left ankle-brachial index indicates mild left lower extremity arterial disease. Unable to obtain doppler or waveform of PTA.  *See table(s) above for measurements and observations.    Preliminary     ROS: All others negative except those listed in HPI.  ROS limited to AMS.   Physical Exam: Vitals:   04/30/20 1100 04/30/20 1130 04/30/20 1200 04/30/20 1230  BP: (!) 105/53 99/67 97/67  (!) 95/55  Pulse: (!) 103 (!) 103 (!) 101 (!) 103  Resp: 20 20 (!) 22 19  Temp:      TempSrc:      SpO2: 100% 100% 95% 93%  Weight:  Height:         General: WDWN obese male in NAD Head: NCAT sclera not icteric MMM Neck: Supple. No lymphadenopathy Lungs: mostly CTAB anteriolaterally. Breathing is unlabored on 3L via [place] Heart: +tachycardia, regular rhythm. No murmur, rubs or gallops.  Abdomen: soft, nontender, obese, +BS, no guarding, no rebound tenderness Lower extremities:trace edema.  Ulcer on R heel and multiple toes b/l.  Neuro: Alert. Not oriented. Moves all extremities spontaneously. Psych:  Responds to questions appropriately with a normal affect. Dialysis Access: [facility] in use  Dialysis Orders:   TTS - [facility], currently on MWF covid shift  4hrs, BFR 400, DFR 800,  EDW 143.5kg, 3K/ 2.5Ca  Access: TDC  Heparin 3000 Mircera 100 mcg q2wks - last 12/8 Venofer 100mg  qHD x10 - completed 6  Assessment/Plan: 1.  b/l foot ulcers - OM suspected  on L foot on MRI - Dr. Sharol Given to eval today. Per admit\ 2. Sacral decub - OM noted on MRI. Per admit. 3. COVID PNA - tested positive on 11/30 initially per SNF.  Unvaccinated. Per admit.  4.  ESRD -  Usually on HD TTS, but has run on MWF COVID shift this week, will continue on MWF COVID shift while admitted.  Next HD on Monday unless urgent indication arise.  K 3.6. 5.  Hypertension/volume  - BP mostly in goal. Does not appear grossly volume overloaded.  Has been getting under EDW at OP HD.  Needs EDW lowered, continue to titrate down volume as tolerated.  6.  Anemia of CKD - Hgb 8.7.  ESA just dosed 12/8. Hold iron d/t infection. 7.  Secondary Hyperparathyroidism -  CCa and phos in goal.  Not on VDRA or binders.  8.  Nutrition - Renal diet w/fluid restrictions  Jen Mow, PA-C [facility] 04/30/2020, 1:12 PM   Nephrology attending: Patient was seen and examined.  Chart reviewed, I agree with assessment and plan as outlined above. ESRD on HD with recent Covid pneumonia getting MWF schedule outpatient admitted with foot and sacral ulcer concerning for infection.  On broad-spectrum antibiotics.  Getting MRI.  Tolerated dialysis well today.  Katheran James, MD [facility].

## 2020-04-30 NOTE — Consult Note (Signed)
[employer] Dec 13, 1970  [id number].    Requesting MD: Dr. Lisabeth Devoid Chief Complaint/Reason for Consult: Sacral wound  HPI: Fred [name] is a 49 y.o. male with a history of ESRD on HD, IDDM, HTN who presented from [facility] 2/2 decreased oral intake.   Patient was recently dx with COVID on 11/30. On admission he was found to have b/l PNA. He was admitted to internal medicine.   Patient also noted to have sacral wound. Patient is unsure how long this has been present. He reports "at least 3 days". Per notes, it appears that SNF reported increased foul smell recently from the wound. WBC 5.8. 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 Patient was started on abx. We were asked to see. Patient reports that he has not walked in some time. He is unsure why he cannot walk and reports "because they won't let me".   ROS: Review of Systems  Constitutional: Positive for fever. Negative for chills.  Respiratory: Positive for cough and shortness of breath.   Cardiovascular: Positive for leg swelling.  Gastrointestinal: Negative for abdominal pain, nausea and vomiting.  Genitourinary: Negative for dysuria.  Musculoskeletal: Positive for joint pain.  All other systems reviewed and are negative.   No family history on file.  No past medical history on file.  As noted above  Social History:  has no history on file for tobacco use, alcohol use, and drug use.  Allergies:  Allergies  Allergen Reactions  . Atorvastatin Other (See Comments)    NOT on MAR    (Not in a hospital admission)    Physical Exam: Blood pressure (!) 147/88, pulse 93, temperature 98.3 F (36.8 C), temperature source Oral, resp. rate 12, height 6\' 6"  (1.981 m), weight (!) 158.8 kg, SpO2 100 %. General: pleasant, WD/WN white male who is laying in bed in NAD HEENT: head is normocephalic, atraumatic.  Sclera are noninjected.  PERRL.  Ears and nose  without any masses or lesions.  Mouth is pink and moist. Dentition fair Heart: Tachycardic with regular rhythm.  No obvious murmurs noted.  Palpable radial pulses bilaterally  Lungs: Rales at bases b/l. No wheezes or rhonchi noted.  Respiratory effort nonlabored Abd: Soft, obese, NT/ND, +BS, no masses, hernias, or organomegaly MS: B/l pedal edema. Calves soft and non-tender. Left 4th toe necrotic appearing. B/l  heel eschar.  Sacral wound: Chaperone present. The total diameter of the wound measures 4cm x 4cm. There is a 2cm x 2cm area of opening with pale necrotic slough. The wound opens cephalad and tracks ~1-2 cm. No drainage. Medial to this, there is a 1cm x 1cm area of necrotic slough as noted in the picture below. Periwound with some faint pink erythema that is blanchable and appears to improve after pressure is shifted off the wound.   Skin: Wounds as noted above. Otherwise warm and dry with no masses, lesions, or rashes Psych: A&Ox3 with an appropriate affect Neuro: cranial nerves grossly intact, moves all extremities, normal speech, thought process intact. Gait not assessed.       Results for orders placed or performed during the hospital encounter of 04/29/20 (from the past 48 hour(s))  Lactic acid, plasma     Status: None   Collection Time: 04/29/20 11:19 AM  Result Value Ref Range   Lactic Acid, Venous 0.9 0.5 - 1.9 mmol/L    Comment: Performed at [facility], [address], [place], [place]  [address]  Blood Culture (routine x 2)     Status: None (Preliminary result)   Collection Time: 04/29/20 11:53 AM   Specimen: BLOOD  Result Value Ref Range   Specimen Description BLOOD RIGHT ANTECUBITAL    Special Requests      BOTTLES DRAWN AEROBIC AND ANAEROBIC Blood Culture adequate volume   Culture      NO GROWTH < 24 HOURS Performed at [facility], [place] [address], [place], [place] [address]    Report Status PENDING   Resp Panel by RT-PCR (Flu A&B, Covid)  Nasopharyngeal Swab     Status: Abnormal   Collection Time: 04/29/20 11:57 AM   Specimen: Nasopharyngeal Swab; Nasopharyngeal(NP) swabs in vial transport medium  Result Value Ref Range   SARS Coronavirus 2 by RT PCR POSITIVE (A) NEGATIVE    Comment: emailed L. Berdik RN 13:00 04/29/20 ([email or web address]) (NOTE) SARS-CoV-2 target nucleic acids are DETECTED.  The SARS-CoV-2 RNA is generally detectable in upper respiratory specimens during the acute phase of infection. Positive results are indicative of the presence of the identified virus, but do not rule out bacterial infection or co-infection with other pathogens not detected by the test. Clinical correlation with patient history and other diagnostic information is necessary to determine patient infection status. The expected result is Negative.  Fact Sheet for Patients: EntrepreneurPulse.com.au  Fact Sheet for Healthcare Providers: IncredibleEmployment.be  This test is not yet approved or cleared by the [place] FDA and  has been authorized for detection and/or diagnosis of SARS-CoV-2 by FDA under an Emergency Use Authorization (EUA).  This EUA will remain in effect (meaning this test can be used) for the duration of  the COVID-1 9 declaration under Section 564(b)(1) of the Act, 21 U.S.C. section 360bbb-3(b)(1), unless the authorization is terminated or revoked sooner.     Influenza A by PCR NEGATIVE NEGATIVE   Influenza B by PCR NEGATIVE NEGATIVE    Comment: (NOTE) The Xpert Xpress SARS-CoV-2/FLU/RSV plus assay is intended as an aid in the diagnosis of influenza from Nasopharyngeal swab specimens and should not be used as a sole basis for treatment. Nasal washings and aspirates are unacceptable for Xpert Xpress SARS-CoV-2/FLU/RSV testing.  Fact Sheet for Patients: EntrepreneurPulse.com.au  Fact Sheet for Healthcare Providers: IncredibleEmployment.be  This  test is not yet approved or cleared by the [place] FDA and has been authorized for detection and/or diagnosis of SARS-CoV-2 by FDA under an Emergency Use Authorization (EUA). This EUA will remain in effect (meaning this test can be used) for the duration of the COVID-19 declaration under Section 564(b)(1) of the Act, 21 U.S.C. section 360bbb-3(b)(1), unless the authorization is terminated or revoked.  Performed at [facility], [place] [address] [place], [place] [address]   Blood Culture (routine x 2)     Status: None (Preliminary result)   Collection Time: 04/29/20 12:10 PM   Specimen: BLOOD  Result Value Ref Range   Specimen Description BLOOD SITE NOT SPECIFIED    Special Requests      BOTTLES DRAWN AEROBIC AND ANAEROBIC Blood Culture adequate volume   Culture      NO GROWTH < 24 HOURS Performed at [facility], [place] [address] [place], [place] [address]    Report Status PENDING   Comprehensive metabolic panel     Status: Abnormal   Collection Time: 04/29/20 12:14 PM  Result Value Ref Range   Sodium 137 135 - 145 mmol/L   Potassium 3.8 3.5 - 5.1  mmol/L   Chloride 101 98 - 111 mmol/L   CO2 26 22 - 32 mmol/L   Glucose, Bld 84 70 - 99 mg/dL    Comment: Glucose reference range applies only to samples taken after fasting for at least 8 hours.   BUN 11 6 - 20 mg/dL   Creatinine, Ser 4.41 (H) 0.61 - 1.24 mg/dL   Calcium 7.3 (L) 8.9 - 10.3 mg/dL   Total Protein 6.0 (L) 6.5 - 8.1 g/dL   Albumin 1.6 (L) 3.5 - 5.0 g/dL   AST 46 (H) 15 - 41 U/L   ALT 21 0 - 44 U/L   Alkaline Phosphatase 186 (H) 38 - 126 U/L   Total Bilirubin 0.5 0.3 - 1.2 mg/dL   GFR, Estimated 16 (L) >60 mL/min    Comment: (NOTE) Calculated using the CKD-EPI Creatinine Equation (2021)    Anion gap 10 5 - 15    Comment: Performed at [facility], [place] [address], [place], [place] [address]  CBC WITH DIFFERENTIAL     Status: Abnormal   Collection Time: 04/29/20 12:14 PM  Result Value Ref  Range   WBC 5.8 4.0 - 10.5 K/uL   RBC 3.13 (L) 4.22 - 5.81 MIL/uL   Hemoglobin 7.8 (L) 13.0 - 17.0 g/dL   HCT 27.1 (L) 39.0 - 52.0 %   MCV 86.6 80.0 - 100.0 fL   MCH 24.9 (L) 26.0 - 34.0 pg   MCHC 28.8 (L) 30.0 - 36.0 g/dL   RDW 16.4 (H) 11.5 - 15.5 %   Platelets 251 150 - 400 K/uL   nRBC 0.0 0.0 - 0.2 %   Neutrophils Relative % 99 %   Neutro Abs 5.7 1.7 - 7.7 K/uL   Lymphocytes Relative 0 %   Lymphs Abs 0.0 (L) 0.7 - 4.0 K/uL   Monocytes Relative 1 %   Monocytes Absolute 0.1 0.1 - 1.0 K/uL   Eosinophils Relative 0 %   Eosinophils Absolute 0.0 0.0 - 0.5 K/uL   Basophils Relative 0 %   Basophils Absolute 0.0 0.0 - 0.1 K/uL   nRBC 0 0 /100 WBC   Abs Immature Granulocytes 0.00 0.00 - 0.07 K/uL    Comment: Performed at [facility], [place] [address] [place], [place] [address]  Protime-INR     Status: None   Collection Time: 04/29/20 12:14 PM  Result Value Ref Range   Prothrombin Time 14.7 11.4 - 15.2 seconds   INR 1.2 0.8 - 1.2    Comment: (NOTE) INR goal varies based on device and disease states. Performed at [facility], [place] [address], [place], [place] [address]   APTT     Status: Abnormal   Collection Time: 04/29/20 12:14 PM  Result Value Ref Range   aPTT 38 (H) 24 - 36 seconds    Comment:        IF BASELINE aPTT IS ELEVATED, SUGGEST PATIENT RISK ASSESSMENT BE USED TO DETERMINE APPROPRIATE ANTICOAGULANT THERAPY. Performed at [facility], [place] [address], [place], [place] [address]   Lactic acid, plasma     Status: None   Collection Time: 04/29/20  1:19 PM  Result Value Ref Range   Lactic Acid, Venous 0.9 0.5 - 1.9 mmol/L    Comment: Performed at [place] [address] [place], [place] [address]  POC occult blood, ED     Status: None   Collection Time: 04/29/20  1:47 PM  Result Value Ref  Range   Fecal Occult Bld NEGATIVE NEGATIVE  Procalcitonin - Baseline     Status: None   Collection Time: 04/29/20  6:58 PM  Result Value Ref Range    Procalcitonin 1.24 ng/mL    Comment:        Interpretation: PCT > 0.5 ng/mL and <= 2 ng/mL: Systemic infection (sepsis) is possible, but other conditions are known to elevate PCT as well. (NOTE)       Sepsis PCT Algorithm           Lower Respiratory Tract                                      Infection PCT Algorithm    ----------------------------     ----------------------------         PCT < 0.25 ng/mL                PCT < 0.10 ng/mL          Strongly encourage             Strongly discourage   discontinuation of antibiotics    initiation of antibiotics    ----------------------------     -----------------------------       PCT 0.25 - 0.50 ng/mL            PCT 0.10 - 0.25 ng/mL               OR       >80% decrease in PCT            Discourage initiation of                                            antibiotics      Encourage discontinuation           of antibiotics    ----------------------------     -----------------------------         PCT >= 0.50 ng/mL              PCT 0.26 - 0.50 ng/mL                AND       <80% decrease in PCT             Encourage initiation of                                             antibiotics       Encourage continuation           of antibiotics    ----------------------------     -----------------------------        PCT >= 0.50 ng/mL                  PCT > 0.50 ng/mL               AND         increase in PCT                  Strongly encourage  initiation of antibiotics    Strongly encourage escalation           of antibiotics                                     -----------------------------                                           PCT <= 0.25 ng/mL                                                 OR                                        > 80% decrease in PCT                                      Discontinue / Do not initiate                                             antibiotics  Performed at [facility], [address] [place], [place] [address]   HIV Antibody (routine testing w rflx)     Status: None   Collection Time: 04/29/20  6:59 PM  Result Value Ref Range   HIV Screen 4th Generation wRfx Non Reactive Non Reactive    Comment: Performed at [facility], [place] [address] [place], [place] [address]  C-reactive protein     Status: Abnormal   Collection Time: 04/29/20  6:59 PM  Result Value Ref Range   CRP 12.6 (H) <1.0 mg/dL    Comment: Performed at [facility] [address], [place], [place] [address]  Comprehensive metabolic panel     Status: Abnormal   Collection Time: 04/29/20  6:59 PM  Result Value Ref Range   Sodium 135 135 - 145 mmol/L   Potassium 3.5 3.5 - 5.1 mmol/L   Chloride 102 98 - 111 mmol/L   CO2 23 22 - 32 mmol/L   Glucose, Bld 80 70 - 99 mg/dL    Comment: Glucose reference range applies only to samples taken after fasting for at least 8 hours.   BUN 15 6 - 20 mg/dL   Creatinine, Ser 4.62 (H) 0.61 - 1.24 mg/dL   Calcium 7.2 (L) 8.9 - 10.3 mg/dL   Total Protein 6.0 (L) 6.5 - 8.1 g/dL   Albumin 1.5 (L) 3.5 - 5.0 g/dL   AST 41 15 - 41 U/L   ALT 18 0 - 44 U/L   Alkaline Phosphatase 177 (H) 38 - 126 U/L   Total Bilirubin 0.7 0.3 - 1.2 mg/dL   GFR, Estimated 15 (L) >60 mL/min    Comment: (NOTE) Calculated using the CKD-EPI Creatinine Equation (2021)    Anion gap 10 5 - 15    Comment: Performed at [facility]  [facility], [place] [address], [place], [place] [address]  CBC with Differential/Platelet     Status: Abnormal   Collection Time: 04/29/20  6:59 PM  Result Value Ref Range   WBC 5.7 4.0 - 10.5 K/uL   RBC 3.24 (L) 4.22 - 5.81 MIL/uL   Hemoglobin 8.3 (L) 13.0 - 17.0 g/dL   HCT 27.8 (L) 39.0 - 52.0 %   MCV 85.8 80.0 - 100.0 fL   MCH 25.6 (L) 26.0 - 34.0 pg   MCHC 29.9 (L) 30.0 - 36.0 g/dL   RDW 16.4 (H) 11.5 - 15.5 %   Platelets 242 150 - 400 K/uL   nRBC 0.0 0.0 - 0.2 %   Neutrophils Relative % 94 %   Neutro Abs 5.4 1.7 - 7.7 K/uL    Lymphocytes Relative 1 %   Lymphs Abs 0.1 (L) 0.7 - 4.0 K/uL   Monocytes Relative 3 %   Monocytes Absolute 0.2 0.1 - 1.0 K/uL   Eosinophils Relative 0 %   Eosinophils Absolute 0.0 0.0 - 0.5 K/uL   Basophils Relative 0 %   Basophils Absolute 0.0 0.0 - 0.1 K/uL   WBC Morphology TOXIC GRANULATION    nRBC 0 0 /100 WBC   Myelocytes 1 %   Promyelocytes Relative 1 %    Comment: Performed at [facility], [place] [address], [place], [place] [address]  D-dimer, quantitative (not at [facility])     Status: Abnormal   Collection Time: 04/29/20  6:59 PM  Result Value Ref Range   D-Dimer, Quant 2.40 (H) 0.00 - 0.50 ug/mL-FEU    Comment: (NOTE) At the manufacturer cut-off value of 0.5 g/mL FEU, this assay has a negative predictive value of 95-100%.This assay is intended for use in conjunction with a clinical pretest probability (PTP) assessment model to exclude pulmonary embolism (PE) and deep venous thrombosis (DVT) in outpatients suspected of PE or DVT. Results should be correlated with clinical presentation. Performed at [facility], [place] [address] [place], [place] [address]   Ferritin     Status: Abnormal   Collection Time: 04/29/20  6:59 PM  Result Value Ref Range   Ferritin 1,520 (H) 24 - 336 ng/mL    Comment: Performed at [facility] [address] [place], [place] [address]  CBG monitoring, ED     Status: None   Collection Time: 04/29/20 11:07 PM  Result Value Ref Range   Glucose-Capillary 82 70 - 99 mg/dL    Comment: Glucose reference range applies only to samples taken after fasting for at least 8 hours.  Procalcitonin     Status: None   Collection Time: 04/30/20  3:36 AM  Result Value Ref Range   Procalcitonin 1.07 ng/mL    Comment:        Interpretation: PCT > 0.5 ng/mL and <= 2 ng/mL: Systemic infection (sepsis) is possible, but other conditions are known to elevate PCT as well. (NOTE)       Sepsis PCT Algorithm           Lower Respiratory Tract                                       Infection PCT Algorithm    ----------------------------     ----------------------------         PCT < 0.25 ng/mL  PCT < 0.10 ng/mL          Strongly encourage             Strongly discourage   discontinuation of antibiotics    initiation of antibiotics    ----------------------------     -----------------------------       PCT 0.25 - 0.50 ng/mL            PCT 0.10 - 0.25 ng/mL               OR       >80% decrease in PCT            Discourage initiation of                                            antibiotics      Encourage discontinuation           of antibiotics    ----------------------------     -----------------------------         PCT >= 0.50 ng/mL              PCT 0.26 - 0.50 ng/mL                AND       <80% decrease in PCT             Encourage initiation of                                             antibiotics       Encourage continuation           of antibiotics    ----------------------------     -----------------------------        PCT >= 0.50 ng/mL                  PCT > 0.50 ng/mL               AND         increase in PCT                  Strongly encourage                                      initiation of antibiotics    Strongly encourage escalation           of antibiotics                                     -----------------------------                                           PCT <= 0.25 ng/mL                                                 OR                                        >  80% decrease in PCT                                      Discontinue / Do not initiate                                             antibiotics  Performed at [facility], [place] [address] [place], [place] [address]   C-reactive protein     Status: Abnormal   Collection Time: 04/30/20  3:36 AM  Result Value Ref Range   CRP 12.7 (H) <1.0 mg/dL    Comment: Performed at [place] [address] [place],  [place] [address]  D-dimer, quantitative (not at [facility])     Status: Abnormal   Collection Time: 04/30/20  3:36 AM  Result Value Ref Range   D-Dimer, Quant 2.48 (H) 0.00 - 0.50 ug/mL-FEU    Comment: (NOTE) At the manufacturer cut-off value of 0.5 g/mL FEU, this assay has a negative predictive value of 95-100%.This assay is intended for use in conjunction with a clinical pretest probability (PTP) assessment model to exclude pulmonary embolism (PE) and deep venous thrombosis (DVT) in outpatients suspected of PE or DVT. Results should be correlated with clinical presentation. Performed at [facility], [place] [address], [place], [place] [address]   Comprehensive metabolic panel     Status: Abnormal   Collection Time: 04/30/20  3:36 AM  Result Value Ref Range   Sodium 137 135 - 145 mmol/L   Potassium 3.6 3.5 - 5.1 mmol/L   Chloride 102 98 - 111 mmol/L   CO2 26 22 - 32 mmol/L   Glucose, Bld 80 70 - 99 mg/dL    Comment: Glucose reference range applies only to samples taken after fasting for at least 8 hours.   BUN 15 6 - 20 mg/dL   Creatinine, Ser 4.83 (H) 0.61 - 1.24 mg/dL   Calcium 7.2 (L) 8.9 - 10.3 mg/dL   Total Protein 6.0 (L) 6.5 - 8.1 g/dL   Albumin 1.5 (L) 3.5 - 5.0 g/dL   AST 41 15 - 41 U/L   ALT 19 0 - 44 U/L   Alkaline Phosphatase 171 (H) 38 - 126 U/L   Total Bilirubin 0.8 0.3 - 1.2 mg/dL   GFR, Estimated 14 (L) >60 mL/min    Comment: (NOTE) Calculated using the CKD-EPI Creatinine Equation (2021)    Anion gap 9 5 - 15    Comment: Performed at [facility], [place] [address], [place], [place] [address]  CBC with Differential/Platelet     Status: Abnormal   Collection Time: 04/30/20  3:36 AM  Result Value Ref Range   WBC 6.5 4.0 - 10.5 K/uL   RBC 3.51 (L) 4.22 - 5.81 MIL/uL   Hemoglobin 8.7 (L) 13.0 - 17.0 g/dL   HCT 30.3 (L) 39.0 - 52.0 %   MCV 86.3 80.0 - 100.0 fL   MCH 24.8 (L) 26.0 - 34.0 pg   MCHC 28.7 (L) 30.0 - 36.0 g/dL   RDW 16.5 (H) 11.5 - 15.5 %    Platelets 227 150 - 400 K/uL   nRBC 0.0 0.0 - 0.2 %   Neutrophils Relative % 78 %   Neutro Abs 5.2 1.7 - 7.7 K/uL  Lymphocytes Relative 14 %   Lymphs Abs 0.9 0.7 - 4.0 K/uL   Monocytes Relative 6 %   Monocytes Absolute 0.4 0.1 - 1.0 K/uL   Eosinophils Relative 1 %   Eosinophils Absolute 0.1 0.0 - 0.5 K/uL   Basophils Relative 0 %   Basophils Absolute 0.0 0.0 - 0.1 K/uL   Immature Granulocytes 1 %   Abs Immature Granulocytes 0.07 0.00 - 0.07 K/uL    Comment: Performed at [facility], [place] [address], [place], [place] [address]  Ferritin     Status: Abnormal   Collection Time: 04/30/20  3:36 AM  Result Value Ref Range   Ferritin 1,346 (H) 24 - 336 ng/mL    Comment: Performed at [facility] [address], [place], [place] [address]  Magnesium     Status: Abnormal   Collection Time: 04/30/20  3:36 AM  Result Value Ref Range   Magnesium 1.6 (L) 1.7 - 2.4 mg/dL    Comment: Performed at [facility] [address], [place], [place] [address]  Phosphorus     Status: None   Collection Time: 04/30/20  3:36 AM  Result Value Ref Range   Phosphorus 2.6 2.5 - 4.6 mg/dL    Comment: Performed at [facility] [address], [place], [place] [address]  CBG monitoring, ED     Status: None   Collection Time: 04/30/20  3:45 AM  Result Value Ref Range   Glucose-Capillary 74 70 - 99 mg/dL    Comment: Glucose reference range applies only to samples taken after fasting for at least 8 hours.   DG Ankle 2 Views Right  Result Date: 04/29/2020 CLINICAL DATA:  Bilateral foot wounds EXAM: RIGHT ANKLE - 2 VIEW COMPARISON:  Right foot series today FINDINGS: Diffuse dense vascular calcifications. No acute bony abnormality. Specifically, no fracture, subluxation, or dislocation. No bone destruction. Soft tissues are intact. Plantar calcaneal spur. IMPRESSION: No acute bony abnormality. Electronically Signed   By: Rolm Baptise M.D.   On: 04/29/2020 13:40   MR PELVIS WO  CONTRAST  Result Date: 04/29/2020 CLINICAL DATA:  Osteomyelitis suspected, pelvis, no prior imaging EXAM: MRI PELVIS WITHOUT CONTRAST TECHNIQUE: Multiplanar multisequence MR imaging of the pelvis was performed. No intravenous contrast was administered. COMPARISON:  None. FINDINGS: Urinary Tract:  No abnormality visualized. Bowel:  Unremarkable visualized pelvic bowel loops. Vascular/Lymphatic: No pathologically enlarged lymph nodes. No significant vascular abnormality seen. Reproductive:  No mass or other significant abnormality Other: Subcutaneous soft tissue hyperintensity of the gluteal subcutaneus soft tissues within associated skin defect chest left of midline at the level of the distal sacrum and coccyx (10:38). Slight hyperintensity of bilateral gluteus maximus musculature. Underlying the ulceration along the left gluteal soft tissues, there are a couple foci of gas within the musculature and soft tissues. No organized fluid collection. No definite deep fascial edema. Musculoskeletal: Hyperintense first coccyx vertebra on T2 fat saturation (11:35) with vague decreased intensity on T1 image (9:10). IMPRESSION: Findings suspicious for coccygeal osteomyelitis in the presence of overlying soft tissue sacral ulceration and cellulitis. Electronically Signed   By: Iven Finn M.D.   On: 04/29/2020 21:36   DG Pelvis Portable  Result Date: 04/29/2020 CLINICAL DATA:  Sacral wounds EXAM: PORTABLE PELVIS 1-2 VIEWS COMPARISON:  None. FINDINGS: No acute bony abnormality. Specifically, no fracture, subluxation, or dislocation. No bone destruction. Diffuse vascular calcifications. IMPRESSION: No acute bony abnormality. Electronically Signed   By: Rolm Baptise M.D.  On: 04/29/2020 13:43   MR FOOT LEFT WO CONTRAST  Result Date: 04/29/2020 CLINICAL DATA:  Foot pain, question of infection EXAM: MRI OF THE LEFT FOOT WITHOUT CONTRAST TECHNIQUE: Multiplanar, multisequence MR imaging of the left was performed. No  intravenous contrast was administered. COMPARISON:  None. FINDINGS: Bones/Joint/Cartilage There is diffuse periosteal thickening with ankylosis seen at the base of the second through fifth digits. Joint space loss seen at the fourth and fifth metatarsal cuboid joint with subchondral cystic changes. There is a T2 bright/T1 dark serpiginous area seen within the first metatarsal shaft which could represent a vascular necrosis. No area cortical destruction or periosteal reaction is noted. There is also a T2 bright/T1 dark area of signal change seen at the distal tuft of the first digit. Muscles and Tendons Increased signal with fatty atrophy of the muscles is seen. The flexor and extensor tendons are intact. The plantar fascia is intact. Soft tissues Area of superficial ulceration seen the dorsum of the first digit with nailbed irregularity. No loculated fluid collection or sinus tract. IMPRESSION: Area of ulceration with nailbed irregularity at the first digit with findings that are suggestive of osteomyelitis involving the first digit distal tuft. No loculated fluid collections or sinus tract. Area of signal abnormality in the first metatarsal shaft which could be due to avascular necrosis/reactive marrow. Electronically Signed   By: Prudencio Pair M.D.   On: 04/29/2020 21:50   DG Chest Port 1 View  Result Date: 04/29/2020 CLINICAL DATA:  Question sepsis. EXAM: PORTABLE CHEST 1 VIEW COMPARISON:  None. FINDINGS: Heart size upper normal. Diffuse bilateral airspace disease with patchy airspace disease right greater than left. No effusion. Right jugular dual lumen catheter tip in the SVC at the cavoatrial junction. No pneumothorax IMPRESSION: Diffuse bilateral airspace disease most likely pneumonia. Correlate with COVID-19 status. Electronically Signed   By: Franchot Gallo M.D.   On: 04/29/2020 12:09   DG Foot 2 Views Left  Result Date: 04/29/2020 CLINICAL DATA:  Bilateral foot wounds EXAM: LEFT FOOT - 2 VIEW  COMPARISON:  None. FINDINGS: Deformity of the 3rd through 5th metatarsals, likely related to old injury. There appears to be fusion across the 2nd through 5th tarsal metatarsal joints. No acute fracture, subluxation or dislocation. No bone destruction. Diffuse vascular calcifications. Soft tissues intact. IMPRESSION: Deformity of the 2nd through 5th metatarsals with fusion across the 2nd through 5th tarsal metatarsal joints, possibly related to old trauma. No acute bony abnormality. Electronically Signed   By: Rolm Baptise M.D.   On: 04/29/2020 13:42   DG Foot 2 Views Right  Result Date: 04/29/2020 CLINICAL DATA:  Bilateral feet wounds EXAM: RIGHT FOOT - 2 VIEW COMPARISON:  None. FINDINGS: Diffuse dense vascular calcifications. No acute bony abnormality. Specifically, no fracture, subluxation, or dislocation. No bone destruction. Soft tissues are intact. Plantar calcaneal spur. IMPRESSION: No acute bony abnormality. Electronically Signed   By: Rolm Baptise M.D.   On: 04/29/2020 13:40    Anti-infectives (From admission, onward)   Start     Dose/Rate Route Frequency Ordered Stop   04/30/20 1300  ceFEPIme (MAXIPIME) 1 g in sodium chloride 0.9 % 100 mL IVPB        1 g 200 mL/hr over 30 Minutes Intravenous Every 24 hours 04/29/20 1254     04/30/20 1000  remdesivir 100 mg in sodium chloride 0.9 % 100 mL IVPB       "Followed by" Linked Group Details   100 mg 200 mL/hr over 30 Minutes  Intravenous Daily 04/29/20 1823 05/04/20 0959   04/29/20 1915  remdesivir 200 mg in sodium chloride 0.9% 250 mL IVPB       "Followed by" Linked Group Details   200 mg 580 mL/hr over 30 Minutes Intravenous Once 04/29/20 1823 04/30/20 0125   04/29/20 1254  vancomycin variable dose per unstable renal function (pharmacist dosing)         Does not apply See admin instructions 04/29/20 1254     04/29/20 1200  ceFEPIme (MAXIPIME) 2 g in sodium chloride 0.9 % 100 mL IVPB        2 g 200 mL/hr over 30 Minutes Intravenous  Once  04/29/20 1155 04/29/20 1354   04/29/20 1200  metroNIDAZOLE (FLAGYL) IVPB 500 mg        500 mg 100 mL/hr over 60 Minutes Intravenous  Once 04/29/20 1155 04/29/20 1354   04/29/20 1200  vancomycin (VANCOCIN) IVPB 1000 mg/200 mL premix  Status:  Discontinued        1,000 mg 200 mL/hr over 60 Minutes Intravenous  Once 04/29/20 1155 04/29/20 1158   04/29/20 1200  vancomycin (VANCOCIN) 2,500 mg in sodium chloride 0.9 % 500 mL IVPB        2,500 mg 250 mL/hr over 120 Minutes Intravenous  Once 04/29/20 1158 04/29/20 1631       Assessment/Plan [id number] HTN ESRD on HD (M/W/F) Pressure injury to bilateral heels, 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 Air mattress to allievate pressure. Frequent turns. Will reassess on Monday.  - There is a component of osteo as well as cellulitis noted on the MRI scan. Patient is on abx. Will defer to primary team. Could consult ID for recs.   FEN - Okay for diet from our standpoint VTE - SCDs, Heparin subq ID - Cefepime/Vanc/Flagyl   Jillyn Ledger, [facility] 04/30/2020, 9:19 AM Please see Amion for pager number during day hours 7:00am-4:30pm

## 2020-04-30 NOTE — Progress Notes (Signed)
Orthopedic Tech Progress Note Patient Details:  Fred [name] 10/01/1970 [id number]  Ortho Devices Type of Ortho Device: Prafo boot/shoe Ortho Device/Splint Location: Bilateral Ortho Device/Splint Interventions: Application,Ordered   Post Interventions Patient Tolerated: Well Instructions Provided: Care of device   Manolo Bosket A Lamari Beckles 04/30/2020, 6:53 PM

## 2020-04-30 NOTE — ED Notes (Signed)
Pt transported to dialysis

## 2020-04-30 NOTE — ED Notes (Signed)
MD Paged for diet order

## 2020-04-30 NOTE — Progress Notes (Signed)
According to [employer] notes, patient typically treats at [facility] on a TTS schedule, but currently treating in isolation for COVID on 3rd shift MWF. He will need to treat in isolation for 21 days or until he tests negative and is cleared by the clinic's Medical Director to return to his normal TTS shift. I have updated clinic that patient has been admitted and will not be at the clinic for treatment tonight. Renal Navigator will follow closely.   Alphonzo Cruise, [place] [facility]al Navigator [phone]

## 2020-04-30 NOTE — Progress Notes (Signed)
   04/30/20 1345  Vitals  Temp 98.8 F (37.1 C)  Temp Source Oral  BP 106/68  BP Location Right Arm  BP Method Automatic  Patient Position (if appropriate) Lying  Pulse Rate 84  Pulse Rate Source Monitor  Resp 17  Oxygen Therapy  SpO2 97 %  O2 Device Room Air  Post-Hemodialysis Assessment  Rinseback Volume (mL) 250 mL  KECN 267 V  Dialyzer Clearance Lightly streaked  Duration of HD Treatment -hour(s) 3 hour(s)  Hemodialysis Intake (mL) 500 mL  UF Total -Machine (mL) 2500 mL  Net UF (mL) 2000 mL  Tolerated HD Treatment Yes  Post-Hemodialysis Comments tx complete-pt stable  Hemodialysis Catheter Right Subclavian  No placement date or time found.   Placed prior to admission: Yes  Orientation: Right  Access Location: Subclavian  Site Condition No complications  Blue Lumen Status Flushed;Capped (Central line);Heparin locked  Red Lumen Status Flushed;Capped (Central line);Heparin locked  Catheter fill solution Heparin 1000 units/ml  Catheter fill volume (Arterial) 1.9 cc  Catheter fill volume (Venous) 1.9  Dressing Type Occlusive  Dressing Status Clean;Dry;Intact  Antimicrobial disc in place? Yes  Interventions New dressing;Dressing changed;Antimicrobial disc changed  Drainage Description None  Dressing Change Due 05/07/20  Post treatment catheter status Capped and Clamped  HD complete-pt tolerated without any difficulty. Unable to give IV vancomycin, per HD charge nurse, Almyra Brace, unable to supply IV pump due to Covid status. Nephrology PA-Lindsey at bedside and made aware, vancomycin can be given on unit, charge nurse Claiborne Billings made aware in report.

## 2020-04-30 NOTE — ED Notes (Signed)
Infectious disease at bedside to evaluated pt

## 2020-04-30 NOTE — ED Notes (Signed)
Pt received into room 36

## 2020-04-30 NOTE — Consult Note (Signed)
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  HPI: Fred [name] is a 49 y.o. male who presents with ulcerations both feet.  Patient states that this started when he fell at the hospital in the end of September.  Patient states that the ulcers on both feet are essentially unchanged.  Patient states that he does not ambulate independently he has tried to ambulate using a parallel bar.  No past medical history on file.  Social History   Socioeconomic History  . Marital status: Single    Spouse name: Not on file  . Number of children: Not on file  . Years of education: Not on file  . Highest education level: Not on file  Occupational History  . Not on file  Tobacco Use  . Smoking status: Not on file  . Smokeless tobacco: Not on file  Substance and Sexual Activity  . Alcohol use: Not on file  . Drug use: Not on file  . Sexual activity: Not on file  Other Topics Concern  . Not on file  Social History Narrative  . Not on file   Social Determinants of Health   Financial Resource Strain: Not on file  Food Insecurity: Not on file  Transportation Needs: Not on file  Physical Activity: Not on file  Stress: Not on file  Social Connections: Not on file   No family history on file. - negative except otherwise stated in the family history section Allergies  Allergen Reactions  . Atorvastatin Other (See Comments)    NOT on MAR   Prior to Admission medications   Medication Sig Start Date End Date Taking? Authorizing Provider  acetaminophen (TYLENOL) 325 MG tablet Take 650 mg by mouth every 6 (six) hours as needed for mild pain or fever (>101 F).   Yes [provider]  amLODipine (NORVASC) 10 MG tablet Take 10 mg by mouth daily.   Yes [provider]  benzonatate (TESSALON) 100 MG capsule Take 100 mg by mouth 3 (three) times daily as needed for cough.   Yes [provider]  Cholecalciferol 25 MCG (1000 UT) tablet Take 1,000 Units by mouth daily.   Yes [provider]  epoetin alfa-epbx (RETACRIT) 11941 UNIT/ML injection Inject 10,000 Units into the skin every Monday, Wednesday, and Friday.   Yes [provider]  famotidine (PEPCID) 20 MG tablet Take 20 mg by mouth 2 (two) times daily.   Yes [provider]  hydrALAZINE (APRESOLINE) 10 MG tablet Take 10 mg by mouth every 6 (six) hours as needed (SBP>180 OR DBP>100).   Yes [provider]  insulin lispro (HUMALOG) 100 UNIT/ML injection Inject 5-15 Units into the skin 4 (four) times daily -  with meals and at bedtime. Per sliding scale 70-79= 0 units 180-200= 5 units 201-250= 8 units 251-300= 10 units 301-350= 12 units 351-400= 15 units Greater than 400 call MD   Yes [provider]  iron polysaccharides (NIFEREX) 150 MG capsule Take 150 mg by mouth daily.   Yes [provider]  Lactobacillus Rhamnosus, GG, (CULTURELLE PO) Take 1 capsule by mouth daily. 10 Billion cell capsule   Yes [provider]  metoCLOPramide (REGLAN) 5 MG tablet Take 5 mg by mouth 3 (three) times daily before meals.   Yes [provider]  metoprolol tartrate (LOPRESSOR) 50 MG tablet Take 50 mg by mouth 2 (two) times daily.   Yes [provider]  multivitamin (RENA-VIT) TABS tablet Take 1 tablet by mouth daily.   Yes [provider]  oxyCODONE-acetaminophen (PERCOCET/ROXICET) 5-325 MG tablet Take 1 tablet by mouth every 6 (six) hours as needed for severe pain or moderate pain ((4-6) for up to 5 days).   Yes [provider]  sodium chloride 1 g tablet Take 1 g by mouth in the morning and at bedtime.   Yes [provider]  tamsulosin (FLOMAX) 0.4 MG CAPS capsule Take 0.4 mg by mouth daily.   Yes [provider]   DG Ankle 2 Views Right  Result Date: 04/29/2020 CLINICAL DATA:  Bilateral foot wounds EXAM: RIGHT ANKLE  - 2 VIEW COMPARISON:  Right foot series today FINDINGS: Diffuse dense vascular calcifications. No acute bony abnormality. Specifically, no fracture, subluxation, or dislocation. No bone destruction. Soft tissues are intact. Plantar calcaneal spur. IMPRESSION: No acute bony abnormality. Electronically Signed   By: Rolm Baptise M.D.   On: 04/29/2020 13:40   MR PELVIS WO CONTRAST  Result Date: 04/29/2020 CLINICAL DATA:  Osteomyelitis suspected, pelvis, no prior imaging EXAM: MRI PELVIS WITHOUT CONTRAST TECHNIQUE: Multiplanar multisequence MR imaging of the pelvis was performed. No intravenous contrast was administered. COMPARISON:  None. FINDINGS: Urinary Tract:  No abnormality visualized. Bowel:  Unremarkable visualized pelvic bowel loops. Vascular/Lymphatic: No pathologically enlarged lymph nodes. No significant vascular abnormality seen. Reproductive:  No mass or other significant abnormality Other: Subcutaneous soft tissue hyperintensity of the gluteal subcutaneus soft tissues within associated skin defect chest left of midline at the level of the distal sacrum and coccyx (10:38). Slight hyperintensity of bilateral gluteus maximus musculature. Underlying the ulceration along the left gluteal soft tissues, there are a couple foci of gas within the musculature and soft tissues. No organized fluid collection. No definite deep fascial edema. Musculoskeletal: Hyperintense first coccyx vertebra on T2 fat saturation (11:35) with vague decreased intensity on T1 image (9:10). IMPRESSION: Findings suspicious for coccygeal osteomyelitis in the presence of overlying soft tissue sacral ulceration and cellulitis. Electronically Signed   By: Iven Finn M.D.   On: 04/29/2020 21:36   DG Pelvis Portable  Result Date: 04/29/2020 CLINICAL DATA:  Sacral wounds EXAM: PORTABLE PELVIS 1-2 VIEWS COMPARISON:  None. FINDINGS: No acute bony abnormality. Specifically, no fracture, subluxation, or dislocation. No bone destruction.  Diffuse vascular calcifications. IMPRESSION: No acute bony abnormality. Electronically Signed   By: Rolm Baptise M.D.   On: 04/29/2020 13:43   MR FOOT LEFT WO CONTRAST  Result Date: 04/29/2020 CLINICAL DATA:  Foot pain, question of infection EXAM: MRI OF THE LEFT FOOT WITHOUT CONTRAST TECHNIQUE: Multiplanar, multisequence MR imaging of the left was performed. No intravenous contrast was administered. COMPARISON:  None. FINDINGS: Bones/Joint/Cartilage There is diffuse periosteal thickening with ankylosis seen at the base of the second through fifth digits. Joint space loss seen at the fourth and fifth metatarsal cuboid joint with subchondral cystic changes. There is a T2 bright/T1 dark serpiginous area seen within the first metatarsal shaft which could represent a vascular necrosis. No area cortical destruction or periosteal reaction is noted. There is also a T2 bright/T1 dark area of signal change seen at the distal tuft of the first digit. Muscles and Tendons Increased signal with fatty atrophy of the muscles is seen. The flexor and extensor tendons are intact. The plantar fascia is intact. Soft tissues Area of superficial ulceration seen the dorsum of the first digit with nailbed irregularity. No loculated fluid collection or sinus tract. IMPRESSION: Area of ulceration  with nailbed irregularity at the first digit with findings that are suggestive of osteomyelitis involving the first digit distal tuft. No loculated fluid collections or sinus tract. Area of signal abnormality in the first metatarsal shaft which could be due to avascular necrosis/reactive marrow. Electronically Signed   By: Prudencio Pair M.D.   On: 04/29/2020 21:50   DG Chest Port 1 View  Result Date: 04/29/2020 CLINICAL DATA:  Question sepsis. EXAM: PORTABLE CHEST 1 VIEW COMPARISON:  None. FINDINGS: Heart size upper normal. Diffuse bilateral airspace disease with patchy airspace disease right greater than left. No effusion. Right jugular dual  lumen catheter tip in the SVC at the cavoatrial junction. No pneumothorax IMPRESSION: Diffuse bilateral airspace disease most likely pneumonia. Correlate with COVID-19 status. Electronically Signed   By: Franchot Gallo M.D.   On: 04/29/2020 12:09   DG Foot 2 Views Left  Result Date: 04/29/2020 CLINICAL DATA:  Bilateral foot wounds EXAM: LEFT FOOT - 2 VIEW COMPARISON:  None. FINDINGS: Deformity of the 3rd through 5th metatarsals, likely related to old injury. There appears to be fusion across the 2nd through 5th tarsal metatarsal joints. No acute fracture, subluxation or dislocation. No bone destruction. Diffuse vascular calcifications. Soft tissues intact. IMPRESSION: Deformity of the 2nd through 5th metatarsals with fusion across the 2nd through 5th tarsal metatarsal joints, possibly related to old trauma. No acute bony abnormality. Electronically Signed   By: Rolm Baptise M.D.   On: 04/29/2020 13:42   DG Foot 2 Views Right  Result Date: 04/29/2020 CLINICAL DATA:  Bilateral feet wounds EXAM: RIGHT FOOT - 2 VIEW COMPARISON:  None. FINDINGS: Diffuse dense vascular calcifications. No acute bony abnormality. Specifically, no fracture, subluxation, or dislocation. No bone destruction. Soft tissues are intact. Plantar calcaneal spur. IMPRESSION: No acute bony abnormality. Electronically Signed   By: Rolm Baptise M.D.   On: 04/29/2020 13:40   VAS [place] ABI WITH/WO TBI  Result Date: 04/30/2020 LOWER EXTREMITY DOPPLER STUDY Indications: Rest pain, and gangrene. High Risk Factors: Hypertension, Diabetes.  Comparison Study: No previous exam Performing Technologist: Vonzell Schlatter RVT  Examination Guidelines: A complete evaluation includes at minimum, Doppler waveform signals and systolic blood pressure reading at the level of bilateral brachial, anterior tibial, and posterior tibial arteries, when vessel segments are accessible. Bilateral testing is considered an integral part of a complete examination. Photoelectric  Plethysmograph (PPG) waveforms and toe systolic pressure readings are included as required and additional duplex testing as needed. Limited examinations for reoccurring indications may be performed as noted.  ABI Findings: +--------+------------------+-----+---------+--------+ Right   Rt Pressure (mmHg)IndexWaveform Comment  +--------+------------------+-----+---------+--------+ [id number]                                      +--------+------------------+-----+---------+--------+ PTA     0                 0.00 absent            +--------+------------------+-----+---------+--------+ DP      253               1.10 triphasic         +--------+------------------+-----+---------+--------+ +--------+------------------+-----+--------+-------+ Left    Lt Pressure (mmHg)IndexWaveformComment +--------+------------------+-----+--------+-------+ [id number]                                    +--------+------------------+-----+--------+-------+ PTA  0                 0.00 absent          +--------+------------------+-----+--------+-------+ DP      183               0.80 biphasic        +--------+------------------+-----+--------+-------+  Summary: Right: Resting right ankle-brachial index is within normal range. No evidence of significant right lower extremity arterial disease. Unable to obtain doppler or waveform of PTA. Left: Resting left ankle-brachial index indicates mild left lower extremity arterial disease. Unable to obtain doppler or waveform of PTA.  *See table(s) above for measurements and observations.    Preliminary    - pertinent xrays, CT, MRI studies were reviewed and independently interpreted  Positive ROS: All other systems have been reviewed and were otherwise negative with the exception of those mentioned in the HPI and as above.  Physical Exam: General: Alert, no acute distress Psychiatric: Patient is competent for consent with normal mood and  affect Lymphatic: No axillary or cervical lymphadenopathy Cardiovascular: No pedal edema Respiratory: No cyanosis, no use of accessory musculature GI: No organomegaly, abdomen is soft and non-tender    Images:  @ENCIMAGES @  Labs:  Lab Results  Component Value Date   CRP 12.7 (H) 04/30/2020   CRP 12.6 (H) 04/29/2020   REPTSTATUS PENDING 04/29/2020   CULT  04/29/2020    NO GROWTH < 24 HOURS Performed at [facility] [address], [place], [place] [address]     Lab Results  Component Value Date   ALBUMIN 1.5 (L) 04/30/2020   ALBUMIN 1.5 (L) 04/29/2020   ALBUMIN 1.6 (L) 04/29/2020    Neurologic: Patient does not have protective sensation bilateral lower extremities.   MUSCULOSKELETAL:   Skin: Examination patient has superficial ischemic ulcers on both feet  involving the left foot fourth and fifth toe and the right fifth metatarsal head and right heel.  These ulcers appear superficial there is no cellulitis no drainage no open wounds.  I cannot palpate a dorsalis pedis or posterior tibial pulse.  The ABIs bilaterally for the posterior tibial are 0 on the right the dorsalis pedis is triphasic with an ABI of 1.1 on the left the dorsalis pedis is 0.8 and biphasic.  MRI scan of his left foot shows no definite osteomyelitis.  Patient has severe protein caloric malnutrition with an albumin of 1.5.  Assessment: Assessment: Chronic stable ulcers to both feet with no acute infection.  Plan: I will write orders for PRAFO boots to be worn bilaterally.  Patient was given instructions to float his heels off the bed.  I can follow-up as an outpatient.  No indication for surgical intervention at this time.  Thank you for the consult and the opportunity to see [name], [employer] [phone] 3:13 PM

## 2020-04-30 NOTE — Consult Note (Addendum)
Reason for Consult:Foot ulcers Referring Physician: Angelia Mould Time called: 0623 Time at bedside: [address] is an 49 y.o. male.  HPI: Fred [name] was admitted yesterday with decreased appetite and concern for his sacral wound. He was also noted to have bilateral foot ulcers and orthopedic surgery was consulted the next day. He has had the ulcers for several months but is not a good historian and can't be more specific that that. He has been in and out of institutions for some time and they have been caring for the wounds. He notes he's had some pain in the past from them but none lately. He has not been ambulatory for some time so can't tell me how weightbearing affects them.  No past medical history on file.  No family history on file.  Social History:  has no history on file for tobacco use, alcohol use, and drug use.  Allergies:  Allergies  Allergen Reactions  . Atorvastatin Other (See Comments)    NOT on MAR    Medications: I have reviewed the patient's current medications.  Results for orders placed or performed during the hospital encounter of 04/29/20 (from the past 48 hour(s))  Lactic acid, plasma     Status: None   Collection Time: 04/29/20 11:19 AM  Result Value Ref Range   Lactic Acid, Venous 0.9 0.5 - 1.9 mmol/L    Comment: Performed at [facility], [address] [place], [place] [address]  Blood Culture (routine x 2)     Status: None (Preliminary result)   Collection Time: 04/29/20 11:53 AM   Specimen: BLOOD  Result Value Ref Range   Specimen Description BLOOD RIGHT ANTECUBITAL    Special Requests      BOTTLES DRAWN AEROBIC AND ANAEROBIC Blood Culture adequate volume   Culture      NO GROWTH < 24 HOURS Performed at [facility], [place] [address] [place], [place] [address]    Report Status PENDING   Resp Panel by RT-PCR (Flu A&B, Covid) Nasopharyngeal Swab     Status: Abnormal   Collection Time: 04/29/20 11:57 AM   Specimen: Nasopharyngeal  Swab; Nasopharyngeal(NP) swabs in vial transport medium  Result Value Ref Range   SARS Coronavirus 2 by RT PCR POSITIVE (A) NEGATIVE    Comment: emailed L. Berdik RN 13:00 04/29/20 ([email or web address]) (NOTE) SARS-CoV-2 target nucleic acids are DETECTED.  The SARS-CoV-2 RNA is generally detectable in upper respiratory specimens during the acute phase of infection. Positive results are indicative of the presence of the identified virus, but do not rule out bacterial infection or co-infection with other pathogens not detected by the test. Clinical correlation with patient history and other diagnostic information is necessary to determine patient infection status. The expected result is Negative.  Fact Sheet for Patients: EntrepreneurPulse.com.au  Fact Sheet for Healthcare Providers: IncredibleEmployment.be  This test is not yet approved or cleared by the [place] FDA and  has been authorized for detection and/or diagnosis of SARS-CoV-2 by FDA under an Emergency Use Authorization (EUA).  This EUA will remain in effect (meaning this test can be used) for the duration of  the COVID-1 9 declaration under Section 564(b)(1) of the Act, 21 U.S.C. section 360bbb-3(b)(1), unless the authorization is terminated or revoked sooner.     Influenza A by PCR NEGATIVE NEGATIVE   Influenza B by PCR NEGATIVE NEGATIVE    Comment: (NOTE) The Xpert Xpress SARS-CoV-2/FLU/RSV plus assay is intended as an aid in the diagnosis  of influenza from Nasopharyngeal swab specimens and should not be used as a sole basis for treatment. Nasal washings and aspirates are unacceptable for Xpert Xpress SARS-CoV-2/FLU/RSV testing.  Fact Sheet for Patients: EntrepreneurPulse.com.au  Fact Sheet for Healthcare Providers: IncredibleEmployment.be  This test is not yet approved or cleared by the [place] FDA and has been authorized for detection and/or  diagnosis of SARS-CoV-2 by FDA under an Emergency Use Authorization (EUA). This EUA will remain in effect (meaning this test can be used) for the duration of the COVID-19 declaration under Section 564(b)(1) of the Act, 21 U.S.C. section 360bbb-3(b)(1), unless the authorization is terminated or revoked.  Performed at [facility], [place] [address], [place], [place] [address]   Blood Culture (routine x 2)     Status: None (Preliminary result)   Collection Time: 04/29/20 12:10 PM   Specimen: BLOOD  Result Value Ref Range   Specimen Description BLOOD SITE NOT SPECIFIED    Special Requests      BOTTLES DRAWN AEROBIC AND ANAEROBIC Blood Culture adequate volume   Culture      NO GROWTH < 24 HOURS Performed at [facility], [place] [address] [place], [place] [address]    Report Status PENDING   Comprehensive metabolic panel     Status: Abnormal   Collection Time: 04/29/20 12:14 PM  Result Value Ref Range   Sodium 137 135 - 145 mmol/L   Potassium 3.8 3.5 - 5.1 mmol/L   Chloride 101 98 - 111 mmol/L   CO2 26 22 - 32 mmol/L   Glucose, Bld 84 70 - 99 mg/dL    Comment: Glucose reference range applies only to samples taken after fasting for at least 8 hours.   BUN 11 6 - 20 mg/dL   Creatinine, Ser 4.41 (H) 0.61 - 1.24 mg/dL   Calcium 7.3 (L) 8.9 - 10.3 mg/dL   Total Protein 6.0 (L) 6.5 - 8.1 g/dL   Albumin 1.6 (L) 3.5 - 5.0 g/dL   AST 46 (H) 15 - 41 U/L   ALT 21 0 - 44 U/L   Alkaline Phosphatase 186 (H) 38 - 126 U/L   Total Bilirubin 0.5 0.3 - 1.2 mg/dL   GFR, Estimated 16 (L) >60 mL/min    Comment: (NOTE) Calculated using the CKD-EPI Creatinine Equation (2021)    Anion gap 10 5 - 15    Comment: Performed at [facility], [place] [address], [place], [place] [address]  CBC WITH DIFFERENTIAL     Status: Abnormal   Collection Time: 04/29/20 12:14 PM  Result Value Ref Range   WBC 5.8 4.0 - 10.5 K/uL   RBC 3.13 (L) 4.22 - 5.81 MIL/uL   Hemoglobin 7.8 (L) 13.0 - 17.0 g/dL    HCT 27.1 (L) 39.0 - 52.0 %   MCV 86.6 80.0 - 100.0 fL   MCH 24.9 (L) 26.0 - 34.0 pg   MCHC 28.8 (L) 30.0 - 36.0 g/dL   RDW 16.4 (H) 11.5 - 15.5 %   Platelets 251 150 - 400 K/uL   nRBC 0.0 0.0 - 0.2 %   Neutrophils Relative % 99 %   Neutro Abs 5.7 1.7 - 7.7 K/uL   Lymphocytes Relative 0 %   Lymphs Abs 0.0 (L) 0.7 - 4.0 K/uL   Monocytes Relative 1 %   Monocytes Absolute 0.1 0.1 - 1.0 K/uL   Eosinophils Relative 0 %   Eosinophils Absolute 0.0 0.0 - 0.5 K/uL   Basophils Relative  0 %   Basophils Absolute 0.0 0.0 - 0.1 K/uL   nRBC 0 0 /100 WBC   Abs Immature Granulocytes 0.00 0.00 - 0.07 K/uL    Comment: Performed at [facility], [place] [address], [place], [place] [address]  Protime-INR     Status: None   Collection Time: 04/29/20 12:14 PM  Result Value Ref Range   Prothrombin Time 14.7 11.4 - 15.2 seconds   INR 1.2 0.8 - 1.2    Comment: (NOTE) INR goal varies based on device and disease states. Performed at [facility], [place] [address], [place], [place] [address]   APTT     Status: Abnormal   Collection Time: 04/29/20 12:14 PM  Result Value Ref Range   aPTT 38 (H) 24 - 36 seconds    Comment:        IF BASELINE aPTT IS ELEVATED, SUGGEST PATIENT RISK ASSESSMENT BE USED TO DETERMINE APPROPRIATE ANTICOAGULANT THERAPY. Performed at [facility], [place] [address] [place], [place] [address]   Lactic acid, plasma     Status: None   Collection Time: 04/29/20  1:19 PM  Result Value Ref Range   Lactic Acid, Venous 0.9 0.5 - 1.9 mmol/L    Comment: Performed at [place] [address] [place],  [address]  POC occult blood, ED     Status: None   Collection Time: 04/29/20  1:47 PM  Result Value Ref Range   Fecal Occult Bld NEGATIVE NEGATIVE  Procalcitonin - Baseline     Status: None   Collection Time: 04/29/20  6:58 PM  Result Value Ref Range   Procalcitonin 1.24 ng/mL    Comment:        Interpretation: PCT > 0.5 ng/mL and <= 2 ng/mL: Systemic  infection (sepsis) is possible, but other conditions are known to elevate PCT as well. (NOTE)       Sepsis PCT Algorithm           Lower Respiratory Tract                                      Infection PCT Algorithm    ----------------------------     ----------------------------         PCT < 0.25 ng/mL                PCT < 0.10 ng/mL          Strongly encourage             Strongly discourage   discontinuation of antibiotics    initiation of antibiotics    ----------------------------     -----------------------------       PCT 0.25 - 0.50 ng/mL            PCT 0.10 - 0.25 ng/mL               OR       >80% decrease in PCT            Discourage initiation of                                            antibiotics      Encourage discontinuation           of  antibiotics    ----------------------------     -----------------------------         PCT >= 0.50 ng/mL              PCT 0.26 - 0.50 ng/mL                AND       <80% decrease in PCT             Encourage initiation of                                             antibiotics       Encourage continuation           of antibiotics    ----------------------------     -----------------------------        PCT >= 0.50 ng/mL                  PCT > 0.50 ng/mL               AND         increase in PCT                  Strongly encourage                                      initiation of antibiotics    Strongly encourage escalation           of antibiotics                                     -----------------------------                                           PCT <= 0.25 ng/mL                                                 OR                                        > 80% decrease in PCT                                      Discontinue / Do not initiate                                             antibiotics  Performed at [facility], [address] [place] [address] [place], [place] [address]   HIV Antibody (routine testing w rflx)      Status: None   Collection Time: 04/29/20  6:59 PM  Result Value Ref Range   HIV Screen 4th  Generation wRfx Non Reactive Non Reactive    Comment: Performed at [facility], [place] [address], [place], [place] [address]  C-reactive protein     Status: Abnormal   Collection Time: 04/29/20  6:59 PM  Result Value Ref Range   CRP 12.6 (H) <1.0 mg/dL    Comment: Performed at [place] [address] [place], [place] [address]  Comprehensive metabolic panel     Status: Abnormal   Collection Time: 04/29/20  6:59 PM  Result Value Ref Range   Sodium 135 135 - 145 mmol/L   Potassium 3.5 3.5 - 5.1 mmol/L   Chloride 102 98 - 111 mmol/L   CO2 23 22 - 32 mmol/L   Glucose, Bld 80 70 - 99 mg/dL    Comment: Glucose reference range applies only to samples taken after fasting for at least 8 hours.   BUN 15 6 - 20 mg/dL   Creatinine, Ser 4.62 (H) 0.61 - 1.24 mg/dL   Calcium 7.2 (L) 8.9 - 10.3 mg/dL   Total Protein 6.0 (L) 6.5 - 8.1 g/dL   Albumin 1.5 (L) 3.5 - 5.0 g/dL   AST 41 15 - 41 U/L   ALT 18 0 - 44 U/L   Alkaline Phosphatase 177 (H) 38 - 126 U/L   Total Bilirubin 0.7 0.3 - 1.2 mg/dL   GFR, Estimated 15 (L) >60 mL/min    Comment: (NOTE) Calculated using the CKD-EPI Creatinine Equation (2021)    Anion gap 10 5 - 15    Comment: Performed at [facility], [place] [address] [place], [place] [address]  CBC with Differential/Platelet     Status: Abnormal   Collection Time: 04/29/20  6:59 PM  Result Value Ref Range   WBC 5.7 4.0 - 10.5 K/uL   RBC 3.24 (L) 4.22 - 5.81 MIL/uL   Hemoglobin 8.3 (L) 13.0 - 17.0 g/dL   HCT 27.8 (L) 39.0 - 52.0 %   MCV 85.8 80.0 - 100.0 fL   MCH 25.6 (L) 26.0 - 34.0 pg   MCHC 29.9 (L) 30.0 - 36.0 g/dL   RDW 16.4 (H) 11.5 - 15.5 %   Platelets 242 150 - 400 K/uL   nRBC 0.0 0.0 - 0.2 %   Neutrophils Relative % 94 %   Neutro Abs 5.4 1.7 - 7.7 K/uL   Lymphocytes Relative 1 %   Lymphs Abs 0.1 (L) 0.7 - 4.0 K/uL   Monocytes Relative 3 %   Monocytes  Absolute 0.2 0.1 - 1.0 K/uL   Eosinophils Relative 0 %   Eosinophils Absolute 0.0 0.0 - 0.5 K/uL   Basophils Relative 0 %   Basophils Absolute 0.0 0.0 - 0.1 K/uL   WBC Morphology TOXIC GRANULATION    nRBC 0 0 /100 WBC   Myelocytes 1 %   Promyelocytes Relative 1 %    Comment: Performed at [facility], [place] [address], [place], [place] [address]  D-dimer, quantitative (not at [facility])     Status: Abnormal   Collection Time: 04/29/20  6:59 PM  Result Value Ref Range   D-Dimer, Quant 2.40 (H) 0.00 - 0.50 ug/mL-FEU    Comment: (NOTE) At the manufacturer cut-off value of 0.5 g/mL FEU, this assay has a negative predictive value of 95-100%.This assay is intended for use in conjunction with a clinical pretest probability (PTP) assessment model to exclude pulmonary embolism (PE) and deep venous thrombosis (DVT) in outpatients suspected of PE or DVT. Results should  be correlated with clinical presentation. Performed at [facility], [place] [address] [place], [place] [address]   Ferritin     Status: Abnormal   Collection Time: 04/29/20  6:59 PM  Result Value Ref Range   Ferritin 1,520 (H) 24 - 336 ng/mL    Comment: Performed at [place] [address] [place] [address], [place], [place] [address]  CBG monitoring, ED     Status: None   Collection Time: 04/29/20 11:07 PM  Result Value Ref Range   Glucose-Capillary 82 70 - 99 mg/dL    Comment: Glucose reference range applies only to samples taken after fasting for at least 8 hours.  Procalcitonin     Status: None   Collection Time: 04/30/20  3:36 AM  Result Value Ref Range   Procalcitonin 1.07 ng/mL    Comment:        Interpretation: PCT > 0.5 ng/mL and <= 2 ng/mL: Systemic infection (sepsis) is possible, but other conditions are known to elevate PCT as well. (NOTE)       Sepsis PCT Algorithm           Lower Respiratory Tract                                      Infection PCT Algorithm    ----------------------------      ----------------------------         PCT < 0.25 ng/mL                PCT < 0.10 ng/mL          Strongly encourage             Strongly discourage   discontinuation of antibiotics    initiation of antibiotics    ----------------------------     -----------------------------       PCT 0.25 - 0.50 ng/mL            PCT 0.10 - 0.25 ng/mL               OR       >80% decrease in PCT            Discourage initiation of                                            antibiotics      Encourage discontinuation           of antibiotics    ----------------------------     -----------------------------         PCT >= 0.50 ng/mL              PCT 0.26 - 0.50 ng/mL                AND       <80% decrease in PCT             Encourage initiation of                                             antibiotics       Encourage continuation           of antibiotics    ----------------------------     -----------------------------  PCT >= 0.50 ng/mL                  PCT > 0.50 ng/mL               AND         increase in PCT                  Strongly encourage                                      initiation of antibiotics    Strongly encourage escalation           of antibiotics                                     -----------------------------                                           PCT <= 0.25 ng/mL                                                 OR                                        > 80% decrease in PCT                                      Discontinue / Do not initiate                                             antibiotics  Performed at [facility], [address], [place], [place] [address]   C-reactive protein     Status: Abnormal   Collection Time: 04/30/20  3:36 AM  Result Value Ref Range   CRP 12.7 (H) <1.0 mg/dL    Comment: Performed at [place] [address], [place], [place] [address]  D-dimer, quantitative (not at [facility])     Status: Abnormal   Collection Time:  04/30/20  3:36 AM  Result Value Ref Range   D-Dimer, Quant 2.48 (H) 0.00 - 0.50 ug/mL-FEU    Comment: (NOTE) At the manufacturer cut-off value of 0.5 g/mL FEU, this assay has a negative predictive value of 95-100%.This assay is intended for use in conjunction with a clinical pretest probability (PTP) assessment model to exclude pulmonary embolism (PE) and deep venous thrombosis (DVT) in outpatients suspected of PE or DVT. Results should be correlated with clinical presentation. Performed at [facility], [place] [address] [place],  [address]   Comprehensive metabolic panel     Status: Abnormal   Collection Time: 04/30/20  3:36 AM  Result Value Ref Range   Sodium 137 135 - 145 mmol/L   Potassium 3.6 3.5 -  5.1 mmol/L   Chloride 102 98 - 111 mmol/L   CO2 26 22 - 32 mmol/L   Glucose, Bld 80 70 - 99 mg/dL    Comment: Glucose reference range applies only to samples taken after fasting for at least 8 hours.   BUN 15 6 - 20 mg/dL   Creatinine, Ser 4.83 (H) 0.61 - 1.24 mg/dL   Calcium 7.2 (L) 8.9 - 10.3 mg/dL   Total Protein 6.0 (L) 6.5 - 8.1 g/dL   Albumin 1.5 (L) 3.5 - 5.0 g/dL   AST 41 15 - 41 U/L   ALT 19 0 - 44 U/L   Alkaline Phosphatase 171 (H) 38 - 126 U/L   Total Bilirubin 0.8 0.3 - 1.2 mg/dL   GFR, Estimated 14 (L) >60 mL/min    Comment: (NOTE) Calculated using the CKD-EPI Creatinine Equation (2021)    Anion gap 9 5 - 15    Comment: Performed at [facility], [place] [address], [place], [place] [address]  CBC with Differential/Platelet     Status: Abnormal   Collection Time: 04/30/20  3:36 AM  Result Value Ref Range   WBC 6.5 4.0 - 10.5 K/uL   RBC 3.51 (L) 4.22 - 5.81 MIL/uL   Hemoglobin 8.7 (L) 13.0 - 17.0 g/dL   HCT 30.3 (L) 39.0 - 52.0 %   MCV 86.3 80.0 - 100.0 fL   MCH 24.8 (L) 26.0 - 34.0 pg   MCHC 28.7 (L) 30.0 - 36.0 g/dL   RDW 16.5 (H) 11.5 - 15.5 %   Platelets 227 150 - 400 K/uL   nRBC 0.0 0.0 - 0.2 %   Neutrophils Relative % 78 %   Neutro  Abs 5.2 1.7 - 7.7 K/uL   Lymphocytes Relative 14 %   Lymphs Abs 0.9 0.7 - 4.0 K/uL   Monocytes Relative 6 %   Monocytes Absolute 0.4 0.1 - 1.0 K/uL   Eosinophils Relative 1 %   Eosinophils Absolute 0.1 0.0 - 0.5 K/uL   Basophils Relative 0 %   Basophils Absolute 0.0 0.0 - 0.1 K/uL   Immature Granulocytes 1 %   Abs Immature Granulocytes 0.07 0.00 - 0.07 K/uL    Comment: Performed at [facility], [address] [place], [place] [address]  Ferritin     Status: Abnormal   Collection Time: 04/30/20  3:36 AM  Result Value Ref Range   Ferritin 1,346 (H) 24 - 336 ng/mL    Comment: Performed at [place] [address], [place], [place] [address]  Magnesium     Status: Abnormal   Collection Time: 04/30/20  3:36 AM  Result Value Ref Range   Magnesium 1.6 (L) 1.7 - 2.4 mg/dL    Comment: Performed at [place] [address], [place], [place] [address]  Phosphorus     Status: None   Collection Time: 04/30/20  3:36 AM  Result Value Ref Range   Phosphorus 2.6 2.5 - 4.6 mg/dL    Comment: Performed at [place] [facility] [address], [place], [place] [address]  CBG monitoring, ED     Status: None   Collection Time: 04/30/20  3:45 AM  Result Value Ref Range   Glucose-Capillary 74 70 - 99 mg/dL    Comment: Glucose reference range applies only to samples taken after fasting for at least 8 hours.  CBG monitoring, ED     Status: None   Collection Time: 04/30/20  9:34 AM  Result Value Ref Range   Glucose-Capillary 75 70 - 99 mg/dL    Comment: Glucose reference range applies only to samples taken after fasting for at least 8 hours.    DG Ankle 2 Views Right  Result Date: 04/29/2020 CLINICAL DATA:  Bilateral foot wounds EXAM: RIGHT ANKLE - 2 VIEW COMPARISON:  Right foot series today FINDINGS: Diffuse dense vascular calcifications. No acute bony abnormality. Specifically, no fracture, subluxation, or dislocation. No bone destruction. Soft tissues are intact. Plantar  calcaneal spur. IMPRESSION: No acute bony abnormality. Electronically Signed   By: Rolm Baptise M.D.   On: 04/29/2020 13:40   MR PELVIS WO CONTRAST  Result Date: 04/29/2020 CLINICAL DATA:  Osteomyelitis suspected, pelvis, no prior imaging EXAM: MRI PELVIS WITHOUT CONTRAST TECHNIQUE: Multiplanar multisequence MR imaging of the pelvis was performed. No intravenous contrast was administered. COMPARISON:  None. FINDINGS: Urinary Tract:  No abnormality visualized. Bowel:  Unremarkable visualized pelvic bowel loops. Vascular/Lymphatic: No pathologically enlarged lymph nodes. No significant vascular abnormality seen. Reproductive:  No mass or other significant abnormality Other: Subcutaneous soft tissue hyperintensity of the gluteal subcutaneus soft tissues within associated skin defect chest left of midline at the level of the distal sacrum and coccyx (10:38). Slight hyperintensity of bilateral gluteus maximus musculature. Underlying the ulceration along the left gluteal soft tissues, there are a couple foci of gas within the musculature and soft tissues. No organized fluid collection. No definite deep fascial edema. Musculoskeletal: Hyperintense first coccyx vertebra on T2 fat saturation (11:35) with vague decreased intensity on T1 image (9:10). IMPRESSION: Findings suspicious for coccygeal osteomyelitis in the presence of overlying soft tissue sacral ulceration and cellulitis. Electronically Signed   By: Iven Finn M.D.   On: 04/29/2020 21:36   DG Pelvis Portable  Result Date: 04/29/2020 CLINICAL DATA:  Sacral wounds EXAM: PORTABLE PELVIS 1-2 VIEWS COMPARISON:  None. FINDINGS: No acute bony abnormality. Specifically, no fracture, subluxation, or dislocation. No bone destruction. Diffuse vascular calcifications. IMPRESSION: No acute bony abnormality. Electronically Signed   By: Rolm Baptise M.D.   On: 04/29/2020 13:43   MR FOOT LEFT WO CONTRAST  Result Date: 04/29/2020 CLINICAL DATA:  Foot pain, question  of infection EXAM: MRI OF THE LEFT FOOT WITHOUT CONTRAST TECHNIQUE: Multiplanar, multisequence MR imaging of the left was performed. No intravenous contrast was administered. COMPARISON:  None. FINDINGS: Bones/Joint/Cartilage There is diffuse periosteal thickening with ankylosis seen at the base of the second through fifth digits. Joint space loss seen at the fourth and fifth metatarsal cuboid joint with subchondral cystic changes. There is a T2 bright/T1 dark serpiginous area seen within the first metatarsal shaft which could represent a vascular necrosis. No area cortical destruction or periosteal reaction is noted. There is also a T2 bright/T1 dark area of signal change seen at the distal tuft of the first digit. Muscles and Tendons Increased signal with fatty atrophy of the muscles is seen. The flexor and extensor tendons are intact. The plantar fascia is intact. Soft tissues Area of superficial ulceration seen the dorsum of the first digit with nailbed irregularity. No loculated fluid collection or sinus tract. IMPRESSION: Area of ulceration with nailbed irregularity at the first digit with findings that are suggestive of osteomyelitis involving the first digit distal tuft. No loculated fluid collections or sinus tract. Area of signal abnormality in the first metatarsal shaft which could be due to avascular necrosis/reactive marrow. Electronically Signed   By: Prudencio Pair M.D.   On: 04/29/2020 21:50   DG Chest [facility]  1 View  Result Date: 04/29/2020 CLINICAL DATA:  Question sepsis. EXAM: PORTABLE CHEST 1 VIEW COMPARISON:  None. FINDINGS: Heart size upper normal. Diffuse bilateral airspace disease with patchy airspace disease right greater than left. No effusion. Right jugular dual lumen catheter tip in the SVC at the cavoatrial junction. No pneumothorax IMPRESSION: Diffuse bilateral airspace disease most likely pneumonia. Correlate with COVID-19 status. Electronically Signed   By: Franchot Gallo M.D.   On:  04/29/2020 12:09   DG Foot 2 Views Left  Result Date: 04/29/2020 CLINICAL DATA:  Bilateral foot wounds EXAM: LEFT FOOT - 2 VIEW COMPARISON:  None. FINDINGS: Deformity of the 3rd through 5th metatarsals, likely related to old injury. There appears to be fusion across the 2nd through 5th tarsal metatarsal joints. No acute fracture, subluxation or dislocation. No bone destruction. Diffuse vascular calcifications. Soft tissues intact. IMPRESSION: Deformity of the 2nd through 5th metatarsals with fusion across the 2nd through 5th tarsal metatarsal joints, possibly related to old trauma. No acute bony abnormality. Electronically Signed   By: Rolm Baptise M.D.   On: 04/29/2020 13:42   DG Foot 2 Views Right  Result Date: 04/29/2020 CLINICAL DATA:  Bilateral feet wounds EXAM: RIGHT FOOT - 2 VIEW COMPARISON:  None. FINDINGS: Diffuse dense vascular calcifications. No acute bony abnormality. Specifically, no fracture, subluxation, or dislocation. No bone destruction. Soft tissues are intact. Plantar calcaneal spur. IMPRESSION: No acute bony abnormality. Electronically Signed   By: Rolm Baptise M.D.   On: 04/29/2020 13:40   VAS [place] ABI WITH/WO TBI  Result Date: 04/30/2020 LOWER EXTREMITY DOPPLER STUDY Indications: Rest pain, and gangrene. High Risk Factors: Hypertension, Diabetes.  Comparison Study: No previous exam Performing Technologist: Vonzell Schlatter RVT  Examination Guidelines: A complete evaluation includes at minimum, Doppler waveform signals and systolic blood pressure reading at the level of bilateral brachial, anterior tibial, and posterior tibial arteries, when vessel segments are accessible. Bilateral testing is considered an integral part of a complete examination. Photoelectric Plethysmograph (PPG) waveforms and toe systolic pressure readings are included as required and additional duplex testing as needed. Limited examinations for reoccurring indications may be performed as noted.  ABI Findings:  +--------+------------------+-----+---------+--------+ Right   Rt Pressure (mmHg)IndexWaveform Comment  +--------+------------------+-----+---------+--------+ [id number]                                      +--------+------------------+-----+---------+--------+ PTA     0                 0.00 absent            +--------+------------------+-----+---------+--------+ DP      253               1.10 triphasic         +--------+------------------+-----+---------+--------+ +--------+------------------+-----+--------+-------+ Left    Lt Pressure (mmHg)IndexWaveformComment +--------+------------------+-----+--------+-------+ [id number]                                    +--------+------------------+-----+--------+-------+ PTA     0                 0.00 absent          +--------+------------------+-----+--------+-------+ DP      183               0.80 biphasic        +--------+------------------+-----+--------+-------+  Summary: Right: Resting right ankle-brachial index is within normal range. No evidence of significant right lower extremity arterial disease. Unable to obtain doppler or waveform of PTA. Left: Resting left ankle-brachial index indicates mild left lower extremity arterial disease. Unable to obtain doppler or waveform of PTA.  *See table(s) above for measurements and observations.    Preliminary     Review of Systems  Constitutional: Negative for chills, diaphoresis and fever.  HENT: Negative for ear discharge, ear pain, hearing loss and tinnitus.   Eyes: Negative for photophobia and pain.  Respiratory: Negative for cough and shortness of breath.   Cardiovascular: Negative for chest pain.  Gastrointestinal: Negative for abdominal pain, nausea and vomiting.  Genitourinary: Negative for dysuria, flank pain, frequency and urgency.  Musculoskeletal: Negative for arthralgias, back pain, myalgias and neck pain.  Neurological: Negative for dizziness and  headaches.  Hematological: Does not bruise/bleed easily.  Psychiatric/Behavioral: The patient is not nervous/anxious.    Blood pressure (!) 105/53, pulse (!) 103, temperature 98.4 F (36.9 C), temperature source Oral, resp. rate 20, height 6\' 6"  (1.981 m), weight (!) 158.8 kg, SpO2 100 %. Physical Exam Constitutional:      General: He is not in acute distress.    Appearance: He is well-developed and well-nourished. He is not diaphoretic.  HENT:     Head: Normocephalic and atraumatic.  Eyes:     General: No scleral icterus.       Right eye: No discharge.        Left eye: No discharge.     Conjunctiva/sclera: Conjunctivae normal.  Cardiovascular:     Rate and Rhythm: Normal rate and regular rhythm.  Pulmonary:     Effort: Pulmonary effort is normal. No respiratory distress.  Musculoskeletal:     Cervical back: Normal range of motion.  Feet:     Comments: Right foot: Necrotic ulcerations heel and 5th MTP joint, 2+ NP edema, 1+ DP, 0 PT, DPN absent, TN/SPN severely paresthetic   Left foot: Necrotic ulcerations 4th toe and 5th MTP joint, 1+ NP edema, 1+ DP, 0 PT, DPN absent, TN/SPN severely paresthetic Skin:    General: Skin is warm and dry.  Neurological:     Mental Status: He is alert.  Psychiatric:        Mood and Affect: Mood and affect normal.        Behavior: Behavior normal.     Assessment/Plan: Bilateral foot ulcers -- Agree with right foot MRI. Dr. Sharol Given to evaluate later today. Multiple medical problems including HTN, DM, ESRD, and Covid+ -- per primary service    Lisette Abu, PA-C Orthopedic Surgery [phone] 04/30/2020, 11:24 AM

## 2020-04-30 NOTE — ED Notes (Signed)
Pt returned from MRI °

## 2020-04-30 NOTE — Progress Notes (Addendum)
HD#1 Subjective:  Overnight Events: None   Patient evaluated at bedside. This morning states he continue to have pain from sacral wound. Feels that O2 improving his breathing.   Objective:  Vital signs in last 24 hours: Vitals:   04/30/20 0200 04/30/20 0300 04/30/20 0400 04/30/20 0500  BP: 133/76 109/75 113/73 133/72  Pulse: 100 (!) 108 98 100  Resp: (!) 24 (!) 24 (!) 21 18  Temp:      TempSrc:      SpO2: 98% 93% 98% 100%  Weight:      Height:       Supplemental O2: Nasal Cannula SpO2: 100 % O2 Flow Rate (L/min): 3 L/min   Physical Exam:  Physical Exam Constitutional:      Appearance: He is obese.  Cardiovascular:     Rate and Rhythm: Tachycardia present.     Heart sounds: Murmur heard.    Pulmonary:     Effort: Pulmonary effort is normal.     Breath sounds: Normal breath sounds.     Comments: Mild expiratory wheezing Musculoskeletal:     Comments: See images for foot and sacral wounds  Skin:    Capillary Refill: Capillary refill takes less than 2 seconds.  Neurological:     Mental Status: He is alert.  Psychiatric:        Mood and Affect: Mood normal.     Filed Weights   04/29/20 1110  Weight: (!) 158.8 kg     Intake/Output Summary (Last 24 hours) at 04/30/2020 0531 Last data filed at 04/30/2020 0125 Gross per 24 hour  Intake 945.52 ml  Output --  Net 945.52 ml   Net IO Since Admission: 945.52 mL [04/30/20 0531]  Pertinent Labs: CBC Latest Ref Rng & Units 04/30/2020 04/29/2020 04/29/2020  WBC 4.0 - 10.5 K/uL 6.5 5.7 5.8  Hemoglobin 13.0 - 17.0 g/dL 8.7(L) 8.3(L) 7.8(L)  Hematocrit 39.0 - 52.0 % 30.3(L) 27.8(L) 27.1(L)  Platelets 150 - 400 K/uL 227 242 251    CMP Latest Ref Rng & Units 04/30/2020 04/29/2020 04/29/2020  Glucose 70 - 99 mg/dL 80 80 84  BUN 6 - 20 mg/dL 15 15 11   Creatinine 0.61 - 1.24 mg/dL 4.83(H) 4.62(H) 4.41(H)  Sodium 135 - 145 mmol/L 137 135 137  Potassium 3.5 - 5.1 mmol/L 3.6 3.5 3.8  Chloride 98 - 111 mmol/L 102 102  101  CO2 22 - 32 mmol/L 26 23 26   Calcium 8.9 - 10.3 mg/dL 7.2(L) 7.2(L) 7.3(L)  Total Protein 6.5 - 8.1 g/dL 6.0(L) 6.0(L) 6.0(L)  Total Bilirubin 0.3 - 1.2 mg/dL 0.8 0.7 0.5  Alkaline Phos 38 - 126 U/L 171(H) 177(H) 186(H)  AST 15 - 41 U/L 41 41 46(H)  ALT 0 - 44 U/L 19 18 21     Imaging: DG Ankle 2 Views Right  Result Date: 04/29/2020 CLINICAL DATA:  Bilateral foot wounds EXAM: RIGHT ANKLE - 2 VIEW COMPARISON:  Right foot series today FINDINGS: Diffuse dense vascular calcifications. No acute bony abnormality. Specifically, no fracture, subluxation, or dislocation. No bone destruction. Soft tissues are intact. Plantar calcaneal spur. IMPRESSION: No acute bony abnormality. Electronically Signed   By: Rolm Baptise M.D.   On: 04/29/2020 13:40   MR PELVIS WO CONTRAST  Result Date: 04/29/2020 CLINICAL DATA:  Osteomyelitis suspected, pelvis, no prior imaging EXAM: MRI PELVIS WITHOUT CONTRAST TECHNIQUE: Multiplanar multisequence MR imaging of the pelvis was performed. No intravenous contrast was administered. COMPARISON:  None. FINDINGS: Urinary Tract:  No abnormality visualized.  Bowel:  Unremarkable visualized pelvic bowel loops. Vascular/Lymphatic: No pathologically enlarged lymph nodes. No significant vascular abnormality seen. Reproductive:  No mass or other significant abnormality Other: Subcutaneous soft tissue hyperintensity of the gluteal subcutaneus soft tissues within associated skin defect chest left of midline at the level of the distal sacrum and coccyx (10:38). Slight hyperintensity of bilateral gluteus maximus musculature. Underlying the ulceration along the left gluteal soft tissues, there are a couple foci of gas within the musculature and soft tissues. No organized fluid collection. No definite deep fascial edema. Musculoskeletal: Hyperintense first coccyx vertebra on T2 fat saturation (11:35) with vague decreased intensity on T1 image (9:10). IMPRESSION: Findings suspicious for  coccygeal osteomyelitis in the presence of overlying soft tissue sacral ulceration and cellulitis. Electronically Signed   By: Iven Finn M.D.   On: 04/29/2020 21:36   DG Pelvis Portable  Result Date: 04/29/2020 CLINICAL DATA:  Sacral wounds EXAM: PORTABLE PELVIS 1-2 VIEWS COMPARISON:  None. FINDINGS: No acute bony abnormality. Specifically, no fracture, subluxation, or dislocation. No bone destruction. Diffuse vascular calcifications. IMPRESSION: No acute bony abnormality. Electronically Signed   By: Rolm Baptise M.D.   On: 04/29/2020 13:43   MR FOOT LEFT WO CONTRAST  Result Date: 04/29/2020 CLINICAL DATA:  Foot pain, question of infection EXAM: MRI OF THE LEFT FOOT WITHOUT CONTRAST TECHNIQUE: Multiplanar, multisequence MR imaging of the left was performed. No intravenous contrast was administered. COMPARISON:  None. FINDINGS: Bones/Joint/Cartilage There is diffuse periosteal thickening with ankylosis seen at the base of the second through fifth digits. Joint space loss seen at the fourth and fifth metatarsal cuboid joint with subchondral cystic changes. There is a T2 bright/T1 dark serpiginous area seen within the first metatarsal shaft which could represent a vascular necrosis. No area cortical destruction or periosteal reaction is noted. There is also a T2 bright/T1 dark area of signal change seen at the distal tuft of the first digit. Muscles and Tendons Increased signal with fatty atrophy of the muscles is seen. The flexor and extensor tendons are intact. The plantar fascia is intact. Soft tissues Area of superficial ulceration seen the dorsum of the first digit with nailbed irregularity. No loculated fluid collection or sinus tract. IMPRESSION: Area of ulceration with nailbed irregularity at the first digit with findings that are suggestive of osteomyelitis involving the first digit distal tuft. No loculated fluid collections or sinus tract. Area of signal abnormality in the first metatarsal  shaft which could be due to avascular necrosis/reactive marrow. Electronically Signed   By: Prudencio Pair M.D.   On: 04/29/2020 21:50   DG Chest Port 1 View  Result Date: 04/29/2020 CLINICAL DATA:  Question sepsis. EXAM: PORTABLE CHEST 1 VIEW COMPARISON:  None. FINDINGS: Heart size upper normal. Diffuse bilateral airspace disease with patchy airspace disease right greater than left. No effusion. Right jugular dual lumen catheter tip in the SVC at the cavoatrial junction. No pneumothorax IMPRESSION: Diffuse bilateral airspace disease most likely pneumonia. Correlate with COVID-19 status. Electronically Signed   By: Franchot Gallo M.D.   On: 04/29/2020 12:09   DG Foot 2 Views Left  Result Date: 04/29/2020 CLINICAL DATA:  Bilateral foot wounds EXAM: LEFT FOOT - 2 VIEW COMPARISON:  None. FINDINGS: Deformity of the 3rd through 5th metatarsals, likely related to old injury. There appears to be fusion across the 2nd through 5th tarsal metatarsal joints. No acute fracture, subluxation or dislocation. No bone destruction. Diffuse vascular calcifications. Soft tissues intact. IMPRESSION: Deformity of the 2nd through 5th metatarsals  with fusion across the 2nd through 5th tarsal metatarsal joints, possibly related to old trauma. No acute bony abnormality. Electronically Signed   By: Rolm Baptise M.D.   On: 04/29/2020 13:42   DG Foot 2 Views Right  Result Date: 04/29/2020 CLINICAL DATA:  Bilateral feet wounds EXAM: RIGHT FOOT - 2 VIEW COMPARISON:  None. FINDINGS: Diffuse dense vascular calcifications. No acute bony abnormality. Specifically, no fracture, subluxation, or dislocation. No bone destruction. Soft tissues are intact. Plantar calcaneal spur. IMPRESSION: No acute bony abnormality. Electronically Signed   By: Rolm Baptise M.D.   On: 04/29/2020 13:40    Assessment/Plan:   Active Problems:   Insulin dependent type 2 diabetes mellitus (HCC)   HTN (hypertension)   History of anemia due to chronic kidney  disease   Sacral decubitus ulcer, stage IV (HCC)   Pressure injury of both heels, unstageable ([place])   Pneumonia due to COVID-19 virus   ESRD (end stage renal disease) ([place])   COVID-19   Patient Summary: 49 y/o male with  history of HTN, DM on insulin, ESRD, Anemia who presents from [facility] because the staff stated that he wasnt eating the way he usually does with recent diagnosis of COVID on 03/24/2020 admitted for COVID pneumonia and SIR with concern for sepsis from covid pneumonia, sacral, or foot wounds.   Sacral decubitus ulcer stage IV Continues to have pain from sacral wound. MRI with coccygeal osteomyelitis. 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 ABIs no PAD of right lower extremity, mild left lower extremity arterial disease. MRI right foot pending. - Discussed with vascular surgery no interventions at this time - Discussed with orthopedic surgery, appreciate reccomendations - Wound care - Continue antibiotics  COVID pneumonia Tested positive on 03/24/2020 states breathing feels better on 3 L . Afebrile overnight. Elevated CRP, ferritin, and ddimer.  - Monitor O2 requirements - trend CRP, ferritin, ddimer - continue on  remdesivir  ESRD on HD States he has been on dialysis for about a month. Has tunneled dialysis catheter of right IJ. Cr. 4 on admission. Dialysis on MWF, completed full session yesterday. - Nephrology consulted appreciate recommendations - Plan for dialysis monday - monitor renal function  Anemia Hgb of 7.4 on admission, unclear  baseline improved to 8.4. Likely in the setting of CKD.  - Monitor hgb, transfuse if <7  Hypertension Normotensive. On amlodipine, hydralazine, and metoprolol at home. - Continue home meds  Diabetes Mellitus -SSI  Diet: Carb modified  VTE: Heparin IVF: None Code: Full  Prior to Admission Living Arrangement: SNF, [name] Anticipated Discharge Location: SNF Barriers to Discharge: Medical mangement  Dispo: Admit patient to Inpatient with expected length of stay greater than 2 midnights.  Signed: Iona Beard, MD 04/29/2020, 6:15 PM  Pager: [phone]  Please contact the on call pager after 5 pm and on weekends at [phone].

## 2020-04-30 NOTE — Consult Note (Signed)
WOC Nurse Consult Note: Patient receiving care in [facility] ED036 Remote consult. Chart and photos reviewed. Page to Dr. Lisabeth Devoid to discuss surgical consult for the sacral wound. Will hold off on any treatment to the sacral wound until the surgical team has evaluated.  Reason for Consult: Sacral and foot wounds Wound type:  #1 Unstageable sacral wound surrounded by erythema. Slough and eschar within the wound bed. # 2 Tuberosity of the right foot unstageable covered with escar # 3 Unstageable to the heel with dry eschar #4 Left 4th toe necrotic, dry with no drainage. Pressure Injury POA: Yes Dressing procedure/placement/frequency: Wash both feet with soap and water, rinse and pat dry. Apply betadine to all unstageable areas of the feet by painting on thoroughly and allowing to air dry. Apply daily. Place both feet in Prevalon boots.  Order and place patient on standard air mattress.   Monitor the wound area(s) for worsening of condition such as: Signs/symptoms of infection, increase in size, development of or worsening of odor, development of pain, or increased pain at the affected locations.   Notify the medical team if any of these develop.  Thank you for the consult. [place] nurse will not follow at this time.   Please re-consult the [place] team if needed.  Cathlean Marseilles Tamala Julian, MSN, RN, [place], Lysle Pearl, [facility] Wound Treatment Associate Pager [phone]

## 2020-04-30 NOTE — ED Notes (Signed)
Pt returned from dialysis

## 2020-04-30 NOTE — ED Notes (Signed)
Pt transported to MRI 

## 2020-04-30 NOTE — ED Notes (Signed)
Residents at bedside

## 2020-04-30 NOTE — ED Notes (Signed)
Pt dialysis.

## 2020-05-01 ENCOUNTER — Other Ambulatory Visit: Payer: Self-pay

## 2020-05-01 ENCOUNTER — Encounter (HOSPITAL_COMMUNITY): Payer: Self-pay | Admitting: Internal Medicine

## 2020-05-01 LAB — COMPREHENSIVE METABOLIC PANEL
ALT: 18 U/L (ref 0–44)
AST: 40 U/L (ref 15–41)
Albumin: 1.5 g/dL — ABNORMAL LOW (ref 3.5–5.0)
Alkaline Phosphatase: 213 U/L — ABNORMAL HIGH (ref 38–126)
Anion gap: 9 (ref 5–15)
BUN: 10 mg/dL (ref 6–20)
CO2: 26 mmol/L (ref 22–32)
Calcium: 7.4 mg/dL — ABNORMAL LOW (ref 8.9–10.3)
Chloride: 100 mmol/L (ref 98–111)
Creatinine, Ser: 3.71 mg/dL — ABNORMAL HIGH (ref 0.61–1.24)
GFR, Estimated: 19 mL/min — ABNORMAL LOW (ref >60)
Glucose, Bld: 96 mg/dL (ref 70–99)
Potassium: 3.4 mmol/L — ABNORMAL LOW (ref 3.5–5.1)
Sodium: 135 mmol/L (ref 135–145)
Total Bilirubin: 0.6 mg/dL (ref 0.3–1.2)
Total Protein: 6 g/dL — ABNORMAL LOW (ref 6.5–8.1)

## 2020-05-01 LAB — PROCALCITONIN: Procalcitonin: 1.19 ng/mL

## 2020-05-01 LAB — CBC WITH DIFFERENTIAL/PLATELET
Abs Immature Granulocytes: 0.08 10*3/uL — ABNORMAL HIGH (ref 0.00–0.07)
Basophils Absolute: 0 10*3/uL (ref 0.0–0.1)
Basophils Relative: 0 %
Eosinophils Absolute: 0.1 10*3/uL (ref 0.0–0.5)
Eosinophils Relative: 2 %
HCT: 28 % — ABNORMAL LOW (ref 39.0–52.0)
Hemoglobin: 8.7 g/dL — ABNORMAL LOW (ref 13.0–17.0)
Immature Granulocytes: 1 %
Lymphocytes Relative: 15 %
Lymphs Abs: 1.2 10*3/uL (ref 0.7–4.0)
MCH: 25.7 pg — ABNORMAL LOW (ref 26.0–34.0)
MCHC: 31.1 g/dL (ref 30.0–36.0)
MCV: 82.8 fL (ref 80.0–100.0)
Monocytes Absolute: 0.5 10*3/uL (ref 0.1–1.0)
Monocytes Relative: 6 %
Neutro Abs: 6.2 10*3/uL (ref 1.7–7.7)
Neutrophils Relative %: 76 %
Platelets: 218 10*3/uL (ref 150–400)
RBC: 3.38 MIL/uL — ABNORMAL LOW (ref 4.22–5.81)
RDW: 16.4 % — ABNORMAL HIGH (ref 11.5–15.5)
WBC: 8 10*3/uL (ref 4.0–10.5)
nRBC: 0 % (ref 0.0–0.2)

## 2020-05-01 LAB — GLUCOSE, CAPILLARY
Glucose-Capillary: 73 mg/dL (ref 70–99)
Glucose-Capillary: 93 mg/dL (ref 70–99)
Glucose-Capillary: 81 mg/dL (ref 70–99)
Glucose-Capillary: 94 mg/dL (ref 70–99)
Glucose-Capillary: 91 mg/dL (ref 70–99)
Glucose-Capillary: 135 mg/dL — ABNORMAL HIGH (ref 70–99)

## 2020-05-01 LAB — C-REACTIVE PROTEIN: CRP: 11.9 mg/dL — ABNORMAL HIGH (ref <1.0)

## 2020-05-01 LAB — PHOSPHORUS: Phosphorus: 1.4 mg/dL — ABNORMAL LOW (ref 2.5–4.6)

## 2020-05-01 LAB — D-DIMER, QUANTITATIVE: D-Dimer, Quant: 2.77 ug/mL-FEU — ABNORMAL HIGH (ref 0.00–0.50)

## 2020-05-01 LAB — FERRITIN: Ferritin: 1438 ng/mL — ABNORMAL HIGH (ref 24–336)

## 2020-05-01 LAB — MAGNESIUM: Magnesium: 1.7 mg/dL (ref 1.7–2.4)

## 2020-05-01 MED ORDER — NEPRO/CARBSTEADY PO LIQD
237.0000 mL | Freq: Two times a day (BID) | ORAL | Status: DC
  Administered 2020-05-01 – 2020-06-06 (×33): 237 mL via ORAL

## 2020-05-01 MED ORDER — OXYCODONE-ACETAMINOPHEN 5-325 MG PO TABS
1.0000 | ORAL_TABLET | ORAL | Status: DC | PRN
  Filled 2020-05-01: qty 1

## 2020-05-01 MED ORDER — LIDOCAINE-PRILOCAINE 2.5-2.5 % EX CREA: 1.0000 "application " | TOPICAL_CREAM | CUTANEOUS | Status: DC | PRN

## 2020-05-01 MED ORDER — K PHOS MONO-SOD PHOS DI & MONO 155-852-130 MG PO TABS
250.0000 mg | ORAL_TABLET | Freq: Two times a day (BID) | ORAL | Status: AC
  Administered 2020-05-01 – 2020-05-03 (×6): 250 mg via ORAL
  Filled 2020-05-01 (×6): qty 1

## 2020-05-01 MED ORDER — PENTAFLUOROPROP-TETRAFLUOROETH EX AERO: 1.0000 "application " | INHALATION_SPRAY | CUTANEOUS | Status: DC | PRN

## 2020-05-01 MED ORDER — INSULIN ASPART 100 UNIT/ML ~~LOC~~ SOLN
3.0000 [IU] | Freq: Three times a day (TID) | SUBCUTANEOUS | Status: DC
  Administered 2020-05-02: 08:00:00 3 [IU] via SUBCUTANEOUS

## 2020-05-01 MED ORDER — PROSOURCE PLUS PO LIQD
30.0000 mL | Freq: Two times a day (BID) | ORAL | Status: DC
  Administered 2020-05-01 – 2020-05-24 (×30): 30 mL via ORAL
  Filled 2020-05-01 (×35): qty 30

## 2020-05-01 MED ORDER — LIDOCAINE HCL (PF) 1 % IJ SOLN: 5.0000 mL | INTRAMUSCULAR | Status: DC | PRN

## 2020-05-01 MED ORDER — HEPARIN SODIUM (PORCINE) 1000 UNIT/ML DIALYSIS: 1000.0000 [IU] | INTRAMUSCULAR | Status: DC | PRN

## 2020-05-01 MED ORDER — SODIUM CHLORIDE 0.9 % IV SOLN: 100.0000 mL | INTRAVENOUS | Status: DC | PRN

## 2020-05-01 MED ORDER — INSULIN ASPART 100 UNIT/ML ~~LOC~~ SOLN: 6.0000 [IU] | Freq: Three times a day (TID) | SUBCUTANEOUS | Status: DC

## 2020-05-01 MED ORDER — HEPARIN SODIUM (PORCINE) 1000 UNIT/ML DIALYSIS: 3000.0000 [IU] | INTRAMUSCULAR | Status: DC | PRN

## 2020-05-01 MED ORDER — DEXAMETHASONE 6 MG PO TABS
6.0000 mg | ORAL_TABLET | Freq: Every day | ORAL | Status: DC
  Administered 2020-05-01 – 2020-05-02 (×2): 6 mg via ORAL
  Filled 2020-05-01 (×2): qty 1

## 2020-05-01 MED ORDER — ALTEPLASE 2 MG IJ SOLR: 2.0000 mg | Freq: Once | INTRAMUSCULAR | Status: DC | PRN

## 2020-05-01 MED ORDER — RENA-VITE PO TABS
1.0000 | ORAL_TABLET | Freq: Every day | ORAL | Status: DC
  Administered 2020-05-01 – 2020-06-05 (×36): 1 via ORAL
  Filled 2020-05-01 (×36): qty 1

## 2020-05-01 NOTE — Progress Notes (Signed)
Physical Therapy Wound Treatment [name] MRN: [id number] Date of Birth: [birth year]  Today's Date: 05/01/2020 Time: 1201-1242 Time Calculation (min): 41 min  Subjective  Subjective: Just some pressure, not pain Patient and Family Stated Goals: I really want to get healed up. Date of Onset:  (prior to admission)  Pain Score: Pain Score: 0-No pain  Wound Assessment  Pressure Injury 05/01/20 Buttocks Left Stage 4 - Full thickness tissue loss with exposed bone, tendon or muscle. packed with santyl gauze, dressed by WOCN, (Active)  Dressing Type ABD;Barrier Film (skin prep);Gauze (Comment);Moist to dry;Other (Comment) 05/01/20 1300  Dressing Clean;Dry;Intact;Changed 05/01/20 1300  Dressing Change Frequency Daily 05/01/20 1300  State of Healing Eschar 05/01/20 1300  Site / Wound Assessment Pink;Red;Yellow 05/01/20 1300  % Wound base Red or Granulating 40% 05/01/20 1300  % Wound base Yellow/Fibrinous Exudate 50% 05/01/20 1300  % Wound base Other/Granulation Tissue (Comment) 10% Adipose in various stages of viability 05/01/20 1300  Wound Length (cm) 6 cm 05/01/20 1300  Wound Width (cm) 6.5 cm 05/01/20 1300  Wound Depth (cm) 3 cm 05/01/20 1300  Wound Surface Area (cm^2) 39 cm^2 05/01/20 1300  Wound Volume (cm^3) 117 cm^3 05/01/20 1300  Undermining (cm) 4 cm 12*, 3 cm 9*, 3 cm 3* 05/01/20 1300  Drainage Amount Moderate 05/01/20 1300  Drainage Description Serous;Sanguineous 05/01/20 1300  Treatment Cleansed;Debridement (Selective);Hydrotherapy (Pulse lavage);Packing (Saline gauze);Other (Comment) 05/01/20 1300   Santyl applied to wound bed prior to applying dressing.    Hydrotherapy Pulsed lavage therapy - wound location: L buttock wound Pulsed Lavage with Suction (psi): 12 psi Pulsed Lavage with Suction - Normal Saline Used: 1000 mL Pulsed Lavage Tip: Tip with splash shield Selective Debridement Selective Debridement - Location: buttock wound Selective  Debridement - Tools Used: Forceps;Scalpel;Scissors Selective Debridement - Tissue Removed: eschar, slough and adipose   Wound Assessment and Plan  Wound Therapy - Assess/Plan/Recommendations Wound Therapy - Clinical Statement: pt will definitely benefit from hydrotherapy for pulsed lavage, to cleanse the wound, decrease bio burden and soften for selective debridement Wound Therapy - Functional Problem List: decrease mobility Factors Delaying/Impairing Wound Healing: Diabetes Mellitus;Altered sensation;Immobility Hydrotherapy Plan: Debridement;Dressing change;Patient/family education;Pulsatile lavage with suction Wound Therapy - Frequency: 6X / week Wound Therapy - Current Recommendations: PT Wound Therapy - Follow Up Recommendations: [place] Wound Plan: see above  Wound Therapy Goals- Improve the function of patient's integumentary system by progressing the wound(s) through the phases of wound healing (inflammation - proliferation - remodeling) by: Decrease Necrotic Tissue to: 15 Decrease Necrotic Tissue - Progress: Goal set today Increase Granulation Tissue to: 85% Increase Granulation Tissue - Progress: Goal set today Improve Drainage Characteristics: Min;Serous Improve Drainage Characteristics - Progress: Goal set today Goals/treatment plan/discharge plan were made with and agreed upon by patient/family: Yes Time For Goal Achievement: 7 days Wound Therapy - Potential for Goals: Good  Goals will be updated until maximal potential achieved or discharge criteria met.  Discharge criteria: when goals achieved, discharge from hospital, MD decision/surgical intervention, no progress towards goals, refusal/missing three consecutive treatments without notification or medical reason.  GP     Tessie Fass Alisse Tuite 05/01/2020, 1:18 PM 05/01/2020  Ginger Carne., PT Acute Rehabilitation Services [phone]  (pager) [phone]  (office)

## 2020-05-01 NOTE — Progress Notes (Addendum)
HD#2 Subjective:  Overnight Events: No events overnight  Patient resting in bed.  He was asleep with nasal cannula off.  SPO2 showed desaturations in the mid to high 80s.  This resolved with 2 L nasal cannula.  Patient woke up stating that he did not have any symptoms.  He denied chest pain, shortness of breath, weakness.  Objective:  Vital signs in last 24 hours: Vitals:   05/01/20 0110 05/01/20 0745 05/01/20 0946 05/01/20 1135  BP: (!) 149/78 140/75 122/83 138/74  Pulse: 95 99  94  Resp: 19 20  20   Temp:  98.2 F (36.8 C)    TempSrc:  Oral  Oral  SpO2: 99% 96% 92% 94%  Weight:      Height:       Supplemental O2: Nasal Cannula SpO2: 94 % O2 Flow Rate (L/min): 3 L/min   Physical Exam:  Physical Exam Constitutional:      General: He is not in acute distress.    Appearance: He is obese. He is ill-appearing.  HENT:     Head: Normocephalic and atraumatic.  Cardiovascular:     Rate and Rhythm: Normal rate.     Pulses: Normal pulses.  Pulmonary:     Breath sounds: Decreased breath sounds and rales present.  Abdominal:     Palpations: Abdomen is soft.  Musculoskeletal:     Right lower leg: No edema.  Skin:    General: Skin is warm and dry.     Comments: Chronic pressure wounds on bilateral feet and sacrum  Neurological:     General: No focal deficit present.     Filed Weights   04/29/20 1110  Weight: (!) 158.8 kg     Intake/Output Summary (Last 24 hours) at 05/01/2020 1513 Last data filed at 05/01/2020 1405 Gross per 24 hour  Intake 546.24 ml  Output --  Net 546.24 ml   Net IO Since Admission: -508.24 mL [05/01/20 1513]  Pertinent Labs: CBC Latest Ref Rng & Units 05/01/2020 04/30/2020 04/29/2020  WBC 4.0 - 10.5 K/uL 8.0 6.5 5.7  Hemoglobin 13.0 - 17.0 g/dL 8.7(L) 8.7(L) 8.3(L)  Hematocrit 39.0 - 52.0 % 28.0(L) 30.3(L) 27.8(L)  Platelets 150 - 400 K/uL 218 227 242    CMP Latest Ref Rng & Units 05/01/2020 04/30/2020 04/29/2020  Glucose 70 - 99 mg/dL  96 80 80  BUN 6 - 20 mg/dL 10 15 15   Creatinine 0.61 - 1.24 mg/dL 3.71(H) 4.83(H) 4.62(H)  Sodium 135 - 145 mmol/L 135 137 135  Potassium 3.5 - 5.1 mmol/L 3.4(L) 3.6 3.5  Chloride 98 - 111 mmol/L 100 102 102  CO2 22 - 32 mmol/L 26 26 23   Calcium 8.9 - 10.3 mg/dL 7.4(L) 7.2(L) 7.2(L)  Total Protein 6.5 - 8.1 g/dL 6.0(L) 6.0(L) 6.0(L)  Total Bilirubin 0.3 - 1.2 mg/dL 0.6 0.8 0.7  Alkaline Phos 38 - 126 U/L 213(H) 171(H) 177(H)  AST 15 - 41 U/L 40 41 41  ALT 0 - 44 U/L 18 19 18     Imaging: MR FOOT RIGHT WO CONTRAST  Result Date: 04/30/2020 CLINICAL DATA:  Right foot pain and limping EXAM: MRI OF THE RIGHT FOOT WITHOUT CONTRAST TECHNIQUE: Multiplanar, multisequence MR imaging of the right foot was performed. No intravenous contrast was administered. Osteomyelitis protocol MRI of the foot was obtained, to include the entire foot and ankle. This protocol uses a large field of view to cover the entire foot and ankle, and is suitable for assessing bony structures for osteomyelitis.  Due to the large field of view and imaging plane choice, this protocol is less sensitive for assessing small structures such as ligamentous structures of the foot and ankle, compared to a dedicated forefoot or dedicated hindfoot exam. COMPARISON:  04/29/2020 radiographs FINDINGS: Bones/Joint/Cartilage Abnormal osseous edema in the head of the fifth metatarsal with underlying cutaneous and subcutaneous ulceration, the appearance is highly suspicious for osteomyelitis. There is abnormal edema distally in the proximal phalanx of the small toe. On image 13 of series 7 this appears to probably be due to a transverse fracture of the distal head of the proximal phalanx. Given the proximity to the ulceration and the presume fifth metatarsal osteomyelitis, I cannot completely exclude osteomyelitis involving the proximal phalanx as an alternative cause for the edema and irregularity. Edema distally in the calcaneus adjacent to the  calcaneocuboid articulation is thought to be due to arthropathy rather than infection. Degenerative findings at the first MTP joint. Suspected red marrow centrally in the first metatarsal. Ligaments INSERT non Muscles and Tendons Diffuse edema along the plantar musculature of the foot could be from myositis or neurogenic edema. Thickened plantar fascia suggest plantar fasciitis. Soft tissues Focal cutaneous and subcutaneous ulceration along the plantar-lateral margin of the fifth MTP joint. Subcutaneous edema and possible ulceration plantar to the first MTP joint. Dorsal subcutaneous edema in the foot, cellulitis is not excluded. There is also some low-grade plantar subcutaneous edema distally in the forefoot. I do not observe a drainable abscess. IMPRESSION: 1. Abnormal osseous edema in the head of the fifth metatarsal with underlying cutaneous and subcutaneous ulceration, highly suspicious for osteomyelitis. 2. There is also abnormal edema distally in the proximal phalanx of the small toe, probably from a transverse fracture of the distal head of the proximal phalanx. I cannot completely exclude osteomyelitis involving the proximal phalanx of the small toe given the proximity to the ulceration and the presume cause for the edema and irregularity. 3. Plantar fasciitis. 4. Degenerative findings at the first MTP joint. 5. Diffuse edema along the plantar musculature of the foot could be from myositis or neurogenic edema. 6. Dorsal subcutaneous edema in the foot, cellulitis is not excluded. No drainable abscess. Electronically Signed   By: Van Clines M.D.   On: 04/30/2020 16:56    Assessment/Plan:   Active Problems:   Insulin dependent type 2 diabetes mellitus (HCC)   HTN (hypertension)   History of anemia due to chronic kidney disease   Sacral osteomyelitis (HCC)   Pressure injury of both heels, unstageable (HCC)   Pneumonia due to COVID-19 virus   ESRD (end stage renal disease) (HCC)   Avascular  necrosis of first metatarsal, left foot (HCC)   Osteomyelitis of dorsal first metatarsal, left foot (HCC)   Decubitus ulcer of sacral region, unstageable ([place])   Morbid obesity [facility])   Patient Summary: 49 y/o male withhistory of HTN, DMon insulin, ESRD, An[facility]who [facility] because the staff stated that he wasnt eating the way he usually does with recent diagnosis of COVID on 03/24/2020 admitted for COVID pneumonia and SIR with concern for sepsis from covid pneumonia, sacral, or foot wounds.   SIRs positive: Patient presented with positive SIRS criteria concerning for sepsis.  His possible sources could be his multiple wounds versus Covid pneumonia.  Patient initially presented febrile and tachycardic.  He was started on broad-spectrum antibiotics with vancomycin, metronidazole, and cefepime as well as antiviral therapy with remdesivir.  Blood cultures are negative at 2 days.  Today the patient  is afebrile, normotensive, and saturating well on 2 L nasal cannula.  He is no longer tachycardic.  Skull to determine the ultimate source as the patient's wounds are likely chronic.   -We will continue to treat as outlined below.  Sacral decubitus ulcer, unstageable Coccygeal osteomyelitis Patient's sacral wound and osteomyelitis are likely chronic.  Surgery consulted and recommended hydrotherapy, chemical debridement with Santyl, and inflatable mattress for pressure offloading.  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 Sharol Given with orthopedics was consulted for osteomyelitis and avascular necrosis of the first digit of the left foot.    -We will continue broad-spectrum antibiotics and wound care.  MRI with osteomyelitis of first digit and avascular   COVID pneumonia Tested positive on 03/24/2020 states breathing feels better on 3 L [place]. Afebrile overnight. Elevated CRP, ferritin, and ddimer.  - Monitor O2 requirements - trend CRP, ferritin, ddimer - continue on remdesivir (2/5) -We will start dexamethasone today (1/10)  ESRD on HD Patient tolerated dialysis today. -Appreciate nephrology's assistance.  Diabetes Mellitus Patient has had normal blood sugars during his hospitalization.  I will start steroids and therefore patient will likely have elevated blood sugars.  I will discontinue his sliding scale insulin and start her 3 units of NovoLog during meals.  We will continue CBG monitoring every 4 hours. -CBG monitoring every 4 hours -NovoLog 3 units with meals   Diet: Carb/Renal IVF: none VTE: Heparin Code: Full PT/OT recs: None ID: Day 2 of vancomycin, cefepime, metronidazole; day 2 of remdesivir  Dispo: Anticipated discharge to Skilled nursing facility pending improvement.  Lawerance Cruel, D.O.  Internal Medicine Resident, PGY-2 [facility]  Pager: [phone] 3:13 PM, 05/01/2020   Please contact the on call pager after 5 pm and on weekends at [phone].

## 2020-05-01 NOTE — Progress Notes (Addendum)
Subjective: pt seen in rm , no sob and  Currently having Sacral ulcer procedure in room, said tolerated Hd yest. 2.0 l uf   Objective Vital signs in last 24 hours: Vitals:   05/01/20 0110 05/01/20 0745 05/01/20 0946 05/01/20 1135  BP: (!) 149/78 140/75 122/83 138/74  Pulse: 95 99  94  Resp: 19 20  20   Temp:  98.2 F (36.8 C)    TempSrc:  Oral  Oral  SpO2: 99% 96% 92% 94%  Weight:      Height:       Weight change:   Physical Exam: General: alert NAD   No further exam currently  Having bedside sacral wound procedure   Dialysis Orders:   TTS - [facility], currently on MWF covid shift  4hrs, BFR 400, DFR 800,  EDW 143.5kg, 3K/ 2.5Ca  Access: TDC  Heparin 3000 Mircera 100 mcg q2wks - last 12/8 Venofer 100mg  qHD x10 - completed 6  Problem/Plan:  1.  b/l foot ulcers - R Ft MRI =  OM suspected on  MRI, L foot on MRI 12/09  - Dr. Sharol Given to eval seeing and plan  Per admit\ 2. Sacral decub - OM noted on MRI. Per admit.CCS  Consulted ,Hydotherapy / chem Debridement  on Vanco/ Cefepime  3. COVID PNA - tested positive on 11/30 initiplan  Per admit.  4.  ESRD -  Usually on HD TTS, but has run on MWF COVID shift this week, will continue on MWF COVID shift while admitted.  Next HD on Monday unless urgent indication arise.  K 3.4 use 4 k bath . 5.  Hypertension/volume  - BP mostly in goal. Does not appear grossly volume overloaded.  Has been getting under EDW at OP HD.  Needs EDW lowered, continue to titrate down volume as tolerated.  6.  Anemia of CKD - Hgb 8.7.  ESA just dosed 12/8. Hold iron d/t infection. 7.  Secondary Hyperparathyroidism -  CCa and phos  1.4  Give supplement .  Not on VDRA or binders.  8.  Nutrition - alb 1.5  Ok to use carb mod / w/fluid restrictions/ with low K, phos ,pro source and Nepro supplement  9. DM T 2 = per admit   Ernest Haber, PA-C [facility] [phone] 05/01/2020,12:03 PM  LOS: 2 days   Labs: Basic Metabolic Panel: Recent Labs  Lab  04/29/20 1859 04/30/20 0336 05/01/20 0342  NA 135 137 135  K 3.5 3.6 3.4*  CL 102 102 100  CO2 23 26 26   GLUCOSE 80 80 96  BUN 15 15 10   CREATININE 4.62* 4.83* 3.71*  CALCIUM 7.2* 7.2* 7.4*  PHOS  --  2.6 1.4*   Liver Function Tests: Recent Labs  Lab 04/29/20 1859 04/30/20 0336 05/01/20 0342  AST 41 41 40  ALT 18 19 18   ALKPHOS 177* 171* 213*  BILITOT 0.7 0.8 0.6  PROT 6.0* 6.0* 6.0*  ALBUMIN 1.5* 1.5* 1.5*   No results for input(s): LIPASE, AMYLASE in the last 168 hours. No results for input(s): AMMONIA in the last 168 hours. CBC: Recent Labs  Lab 04/29/20 1214 04/29/20 1859 04/30/20 0336 05/01/20 0342  WBC 5.8 5.7 6.5 8.0  NEUTROABS 5.7 5.4 5.2 6.2  HGB 7.8* 8.3* 8.7* 8.7*  HCT 27.1* 27.8* 30.3* 28.0*  MCV 86.6 85.8 86.3 82.8  PLT 251 242 227 218   Cardiac Enzymes: No results for input(s): CKTOTAL, CKMB, CKMBINDEX, TROPONINI in the last 168 hours. CBG: Recent  Labs  Lab 04/30/20 2339 05/01/20 0306 05/01/20 0336 05/01/20 0749 05/01/20 1139  GLUCAP 86 73 93 81 94    Studies/Results: DG Ankle 2 Views Right  Result Date: 04/29/2020 CLINICAL DATA:  Bilateral foot wounds EXAM: RIGHT ANKLE - 2 VIEW COMPARISON:  Right foot series today FINDINGS: Diffuse dense vascular calcifications. No acute bony abnormality. Specifically, no fracture, subluxation, or dislocation. No bone destruction. Soft tissues are intact. Plantar calcaneal spur. IMPRESSION: No acute bony abnormality. Electronically Signed   By: Rolm Baptise M.D.   On: 04/29/2020 13:40   MR PELVIS WO CONTRAST  Result Date: 04/29/2020 CLINICAL DATA:  Osteomyelitis suspected, pelvis, no prior imaging EXAM: MRI PELVIS WITHOUT CONTRAST TECHNIQUE: Multiplanar multisequence MR imaging of the pelvis was performed. No intravenous contrast was administered. COMPARISON:  None. FINDINGS: Urinary Tract:  No abnormality visualized. Bowel:  Unremarkable visualized pelvic bowel loops. Vascular/Lymphatic: No pathologically  enlarged lymph nodes. No significant vascular abnormality seen. Reproductive:  No mass or other significant abnormality Other: Subcutaneous soft tissue hyperintensity of the gluteal subcutaneus soft tissues within associated skin defect chest left of midline at the level of the distal sacrum and coccyx (10:38). Slight hyperintensity of bilateral gluteus maximus musculature. Underlying the ulceration along the left gluteal soft tissues, there are a couple foci of gas within the musculature and soft tissues. No organized fluid collection. No definite deep fascial edema. Musculoskeletal: Hyperintense first coccyx vertebra on T2 fat saturation (11:35) with vague decreased intensity on T1 image (9:10). IMPRESSION: Findings suspicious for coccygeal osteomyelitis in the presence of overlying soft tissue sacral ulceration and cellulitis. Electronically Signed   By: Iven Finn M.D.   On: 04/29/2020 21:36   DG Pelvis Portable  Result Date: 04/29/2020 CLINICAL DATA:  Sacral wounds EXAM: PORTABLE PELVIS 1-2 VIEWS COMPARISON:  None. FINDINGS: No acute bony abnormality. Specifically, no fracture, subluxation, or dislocation. No bone destruction. Diffuse vascular calcifications. IMPRESSION: No acute bony abnormality. Electronically Signed   By: Rolm Baptise M.D.   On: 04/29/2020 13:43   MR FOOT RIGHT WO CONTRAST  Result Date: 04/30/2020 CLINICAL DATA:  Right foot pain and limping EXAM: MRI OF THE RIGHT FOOT WITHOUT CONTRAST TECHNIQUE: Multiplanar, multisequence MR imaging of the right foot was performed. No intravenous contrast was administered. Osteomyelitis protocol MRI of the foot was obtained, to include the entire foot and ankle. This protocol uses a large field of view to cover the entire foot and ankle, and is suitable for assessing bony structures for osteomyelitis. Due to the large field of view and imaging plane choice, this protocol is less sensitive for assessing small structures such as ligamentous  structures of the foot and ankle, compared to a dedicated forefoot or dedicated hindfoot exam. COMPARISON:  04/29/2020 radiographs FINDINGS: Bones/Joint/Cartilage Abnormal osseous edema in the head of the fifth metatarsal with underlying cutaneous and subcutaneous ulceration, the appearance is highly suspicious for osteomyelitis. There is abnormal edema distally in the proximal phalanx of the small toe. On image 13 of series 7 this appears to probably be due to a transverse fracture of the distal head of the proximal phalanx. Given the proximity to the ulceration and the presume fifth metatarsal osteomyelitis, I cannot completely exclude osteomyelitis involving the proximal phalanx as an alternative cause for the edema and irregularity. Edema distally in the calcaneus adjacent to the calcaneocuboid articulation is thought to be due to arthropathy rather than infection. Degenerative findings at the first MTP joint. Suspected red marrow centrally in the first metatarsal. Ligaments INSERT non  Muscles and Tendons Diffuse edema along the plantar musculature of the foot could be from myositis or neurogenic edema. Thickened plantar fascia suggest plantar fasciitis. Soft tissues Focal cutaneous and subcutaneous ulceration along the plantar-lateral margin of the fifth MTP joint. Subcutaneous edema and possible ulceration plantar to the first MTP joint. Dorsal subcutaneous edema in the foot, cellulitis is not excluded. There is also some low-grade plantar subcutaneous edema distally in the forefoot. I do not observe a drainable abscess. IMPRESSION: 1. Abnormal osseous edema in the head of the fifth metatarsal with underlying cutaneous and subcutaneous ulceration, highly suspicious for osteomyelitis. 2. There is also abnormal edema distally in the proximal phalanx of the small toe, probably from a transverse fracture of the distal head of the proximal phalanx. I cannot completely exclude osteomyelitis involving the proximal  phalanx of the small toe given the proximity to the ulceration and the presume cause for the edema and irregularity. 3. Plantar fasciitis. 4. Degenerative findings at the first MTP joint. 5. Diffuse edema along the plantar musculature of the foot could be from myositis or neurogenic edema. 6. Dorsal subcutaneous edema in the foot, cellulitis is not excluded. No drainable abscess. Electronically Signed   By: Van Clines M.D.   On: 04/30/2020 16:56   MR FOOT LEFT WO CONTRAST  Result Date: 04/29/2020 CLINICAL DATA:  Foot pain, question of infection EXAM: MRI OF THE LEFT FOOT WITHOUT CONTRAST TECHNIQUE: Multiplanar, multisequence MR imaging of the left was performed. No intravenous contrast was administered. COMPARISON:  None. FINDINGS: Bones/Joint/Cartilage There is diffuse periosteal thickening with ankylosis seen at the base of the second through fifth digits. Joint space loss seen at the fourth and fifth metatarsal cuboid joint with subchondral cystic changes. There is a T2 bright/T1 dark serpiginous area seen within the first metatarsal shaft which could represent a vascular necrosis. No area cortical destruction or periosteal reaction is noted. There is also a T2 bright/T1 dark area of signal change seen at the distal tuft of the first digit. Muscles and Tendons Increased signal with fatty atrophy of the muscles is seen. The flexor and extensor tendons are intact. The plantar fascia is intact. Soft tissues Area of superficial ulceration seen the dorsum of the first digit with nailbed irregularity. No loculated fluid collection or sinus tract. IMPRESSION: Area of ulceration with nailbed irregularity at the first digit with findings that are suggestive of osteomyelitis involving the first digit distal tuft. No loculated fluid collections or sinus tract. Area of signal abnormality in the first metatarsal shaft which could be due to avascular necrosis/reactive marrow. Electronically Signed   By: Prudencio Pair M.D.   On: 04/29/2020 21:50   DG Foot 2 Views Left  Result Date: 04/29/2020 CLINICAL DATA:  Bilateral foot wounds EXAM: LEFT FOOT - 2 VIEW COMPARISON:  None. FINDINGS: Deformity of the 3rd through 5th metatarsals, likely related to old injury. There appears to be fusion across the 2nd through 5th tarsal metatarsal joints. No acute fracture, subluxation or dislocation. No bone destruction. Diffuse vascular calcifications. Soft tissues intact. IMPRESSION: Deformity of the 2nd through 5th metatarsals with fusion across the 2nd through 5th tarsal metatarsal joints, possibly related to old trauma. No acute bony abnormality. Electronically Signed   By: Rolm Baptise M.D.   On: 04/29/2020 13:42   DG Foot 2 Views Right  Result Date: 04/29/2020 CLINICAL DATA:  Bilateral feet wounds EXAM: RIGHT FOOT - 2 VIEW COMPARISON:  None. FINDINGS: Diffuse dense vascular calcifications. No acute bony abnormality. Specifically,  no fracture, subluxation, or dislocation. No bone destruction. Soft tissues are intact. Plantar calcaneal spur. IMPRESSION: No acute bony abnormality. Electronically Signed   By: Rolm Baptise M.D.   On: 04/29/2020 13:40   VAS [place] ABI WITH/WO TBI  Result Date: 04/30/2020 LOWER EXTREMITY DOPPLER STUDY Indications: Rest pain, and gangrene. High Risk Factors: Hypertension, Diabetes.  Comparison Study: No previous exam Performing Technologist: Vonzell Schlatter RVT  Examination Guidelines: A complete evaluation includes at minimum, Doppler waveform signals and systolic blood pressure reading at the level of bilateral brachial, anterior tibial, and posterior tibial arteries, when vessel segments are accessible. Bilateral testing is considered an integral part of a complete examination. Photoelectric Plethysmograph (PPG) waveforms and toe systolic pressure readings are included as required and additional duplex testing as needed. Limited examinations for reoccurring indications may be performed as noted.  ABI  Findings: +--------+------------------+-----+---------+--------+ Right   Rt Pressure (mmHg)IndexWaveform Comment  +--------+------------------+-----+---------+--------+ [id number]                                      +--------+------------------+-----+---------+--------+ PTA     0                 0.00 absent            +--------+------------------+-----+---------+--------+ DP      253               1.10 triphasic         +--------+------------------+-----+---------+--------+ +--------+------------------+-----+--------+-------+ Left    Lt Pressure (mmHg)IndexWaveformComment +--------+------------------+-----+--------+-------+ [id number]                                    +--------+------------------+-----+--------+-------+ PTA     0                 0.00 absent          +--------+------------------+-----+--------+-------+ DP      183               0.80 biphasic        +--------+------------------+-----+--------+-------+  Summary: Right: Resting right ankle-brachial index is within normal range. No evidence of significant right lower extremity arterial disease. Unable to obtain doppler or waveform of PTA. Left: Resting left ankle-brachial index indicates mild left lower extremity arterial disease. Unable to obtain doppler or waveform of PTA.  *See table(s) above for measurements and observations.    Preliminary    Medications: . sodium chloride    . sodium chloride    . ceFEPime (MAXIPIME) IV Stopped (04/30/20 2136)  . remdesivir 100 mg in NS 100 mL 100 mg (05/01/20 0955)  . vancomycin Stopped (04/30/20 1814)   . amLODipine  10 mg Oral Daily  . Chlorhexidine Gluconate Cloth  6 each Topical Q0600  . collagenase   Topical BID  . famotidine  20 mg Oral BID  . fentaNYL (SUBLIMAZE) injection  100 mcg Intravenous Once  . heparin  5,000 Units Subcutaneous Q8H  . insulin aspart  0-15 Units Subcutaneous Q4H  . metoCLOPramide  5 mg Oral TID AC  . senna  1 tablet  Oral BID  . sodium chloride flush  3 mL Intravenous Q12H

## 2020-05-01 NOTE — ED Notes (Signed)
Attempted to give report, room being cleaned

## 2020-05-01 NOTE — Progress Notes (Addendum)
Pt arrived to the unit via stretcher from the ED, vitals WNL outside of mild tachycardia. Skin checked with Marguerite Olea, RN. Pt has multiple pressure injuries (see LDA), CHG was completed. CBG was 73, 2 cups of juice given, CBG was 93 after 30 mins.  Pt oriented to the room and unit, call bell placed next to patient.

## 2020-05-02 DIAGNOSIS — J1282 Pneumonia due to coronavirus disease 2019: Secondary | ICD-10-CM

## 2020-05-02 LAB — CBC WITH DIFFERENTIAL/PLATELET
Abs Immature Granulocytes: 0.07 10*3/uL (ref 0.00–0.07)
Basophils Absolute: 0 10*3/uL (ref 0.0–0.1)
Basophils Relative: 0 %
Eosinophils Absolute: 0 10*3/uL (ref 0.0–0.5)
Eosinophils Relative: 0 %
HCT: 28.4 % — ABNORMAL LOW (ref 39.0–52.0)
Hemoglobin: 8.4 g/dL — ABNORMAL LOW (ref 13.0–17.0)
Immature Granulocytes: 1 %
Lymphocytes Relative: 9 %
Lymphs Abs: 0.6 10*3/uL — ABNORMAL LOW (ref 0.7–4.0)
MCH: 24.7 pg — ABNORMAL LOW (ref 26.0–34.0)
MCHC: 29.6 g/dL — ABNORMAL LOW (ref 30.0–36.0)
MCV: 83.5 fL (ref 80.0–100.0)
Monocytes Absolute: 0.1 10*3/uL (ref 0.1–1.0)
Monocytes Relative: 2 %
Neutro Abs: 5.9 10*3/uL (ref 1.7–7.7)
Neutrophils Relative %: 88 %
Platelets: 270 10*3/uL (ref 150–400)
RBC: 3.4 MIL/uL — ABNORMAL LOW (ref 4.22–5.81)
RDW: 16.6 % — ABNORMAL HIGH (ref 11.5–15.5)
WBC: 6.7 10*3/uL (ref 4.0–10.5)
nRBC: 0 % (ref 0.0–0.2)

## 2020-05-02 LAB — C-REACTIVE PROTEIN: CRP: 12.3 mg/dL — ABNORMAL HIGH (ref <1.0)

## 2020-05-02 LAB — COMPREHENSIVE METABOLIC PANEL
ALT: 17 U/L (ref 0–44)
AST: 32 U/L (ref 15–41)
Albumin: 1.4 g/dL — ABNORMAL LOW (ref 3.5–5.0)
Alkaline Phosphatase: 239 U/L — ABNORMAL HIGH (ref 38–126)
Anion gap: 12 (ref 5–15)
BUN: 16 mg/dL (ref 6–20)
CO2: 24 mmol/L (ref 22–32)
Calcium: 7.6 mg/dL — ABNORMAL LOW (ref 8.9–10.3)
Chloride: 98 mmol/L (ref 98–111)
Creatinine, Ser: 4.91 mg/dL — ABNORMAL HIGH (ref 0.61–1.24)
GFR, Estimated: 14 mL/min — ABNORMAL LOW (ref >60)
Glucose, Bld: 196 mg/dL — ABNORMAL HIGH (ref 70–99)
Potassium: 3.8 mmol/L (ref 3.5–5.1)
Sodium: 134 mmol/L — ABNORMAL LOW (ref 135–145)
Total Bilirubin: 0.9 mg/dL (ref 0.3–1.2)
Total Protein: 5.9 g/dL — ABNORMAL LOW (ref 6.5–8.1)

## 2020-05-02 LAB — MAGNESIUM: Magnesium: 1.9 mg/dL (ref 1.7–2.4)

## 2020-05-02 LAB — GLUCOSE, CAPILLARY
Glucose-Capillary: 173 mg/dL — ABNORMAL HIGH (ref 70–99)
Glucose-Capillary: 207 mg/dL — ABNORMAL HIGH (ref 70–99)
Glucose-Capillary: 251 mg/dL — ABNORMAL HIGH (ref 70–99)
Glucose-Capillary: 278 mg/dL — ABNORMAL HIGH (ref 70–99)

## 2020-05-02 LAB — PHOSPHORUS: Phosphorus: 3.5 mg/dL (ref 2.5–4.6)

## 2020-05-02 LAB — D-DIMER, QUANTITATIVE: D-Dimer, Quant: 2.01 ug/mL-FEU — ABNORMAL HIGH (ref 0.00–0.50)

## 2020-05-02 LAB — FERRITIN: Ferritin: 1414 ng/mL — ABNORMAL HIGH (ref 24–336)

## 2020-05-02 MED ORDER — INSULIN ASPART 100 UNIT/ML ~~LOC~~ SOLN
5.0000 [IU] | Freq: Three times a day (TID) | SUBCUTANEOUS | Status: AC
  Administered 2020-05-02 – 2020-05-09 (×19): 5 [IU] via SUBCUTANEOUS

## 2020-05-02 MED ORDER — INSULIN ASPART 100 UNIT/ML ~~LOC~~ SOLN: 5.0000 [IU] | Freq: Once | SUBCUTANEOUS | Status: DC

## 2020-05-02 NOTE — Progress Notes (Signed)
[facility] Progress Note   Subjective:   Patient seen and examined at bedside.  Today he is oriented to person, place and month/year. No specific complaints.  Denies CP, SOB, n/v/d, weakness and fatigue.   Objective Vitals:   05/01/20 1952 05/01/20 2346 05/02/20 0401 05/02/20 0700  BP: (!) 120/53 (!) 156/65 138/85 (!) 158/95  Pulse: 95 (!) 101  95  Resp: 17 18 18 14   Temp: 98.6 F (37 C) 98.4 F (36.9 C) 98.6 F (37 C) 98.6 F (37 C)  TempSrc: Oral Oral Axillary Oral  SpO2: 91% 94% 94% 95%  Weight:   131.4 kg   Height:       Physical Exam General:chroncially ill appearing male in NAD Heart:RRR, no mrg Lungs:mostly CTAB, BS decreased Abdomen:soft, NTND Extremities:trace edema b/l.  B/l feet/ankle wrapped  Dialysis Access: [facility]   Filed Weights   04/29/20 1110 05/02/20 0401  Weight: (!) 158.8 kg 131.4 kg    Intake/Output Summary (Last 24 hours) at 05/02/2020 1055 Last data filed at 05/02/2020 0001 Gross per 24 hour  Intake 485.4 ml  Output 100 ml  Net 385.4 ml    Additional Objective Labs: Basic Metabolic Panel: Recent Labs  Lab 04/30/20 0336 05/01/20 0342 05/02/20 0357  NA 137 135 134*  K 3.6 3.4* 3.8  CL 102 100 98  CO2 26 26 24   GLUCOSE 80 96 196*  BUN 15 10 16   CREATININE 4.83* 3.71* 4.91*  CALCIUM 7.2* 7.4* 7.6*  PHOS 2.6 1.4* 3.5   Liver Function Tests: Recent Labs  Lab 04/30/20 0336 05/01/20 0342 05/02/20 0357  AST 41 40 32  ALT 19 18 17   ALKPHOS 171* 213* 239*  BILITOT 0.8 0.6 0.9  PROT 6.0* 6.0* 5.9*  ALBUMIN 1.5* 1.5* 1.4*   CBC: Recent Labs  Lab 04/29/20 1214 04/29/20 1859 04/30/20 0336 05/01/20 0342 05/02/20 0357  WBC 5.8 5.7 6.5 8.0 6.7  NEUTROABS 5.7 5.4 5.2 6.2 5.9  HGB 7.8* 8.3* 8.7* 8.7* 8.4*  HCT 27.1* 27.8* 30.3* 28.0* 28.4*  MCV 86.6 85.8 86.3 82.8 83.5  PLT 251 242 227 218 270   Blood Culture    Component Value Date/Time   SDES BLOOD SITE NOT SPECIFIED 04/29/2020 1210   SPECREQUEST   04/29/2020 1210    BOTTLES DRAWN AEROBIC AND ANAEROBIC Blood Culture adequate volume   CULT  04/29/2020 1210    NO GROWTH 2 DAYS Performed at [facility], [place] [address], [place], [place] [address]    REPTSTATUS PENDING 04/29/2020 1210   CBG: Recent Labs  Lab 05/01/20 0749 05/01/20 1139 05/01/20 1729 05/01/20 2054 05/02/20 0738  GLUCAP 81 94 91 135* 173*   Iron Studies:  Recent Labs    05/02/20 0357  FERRITIN 1,414*   Lab Results  Component Value Date   INR 1.2 04/29/2020   Studies/Results: MR FOOT RIGHT WO CONTRAST  Result Date: 04/30/2020 CLINICAL DATA:  Right foot pain and limping EXAM: MRI OF THE RIGHT FOOT WITHOUT CONTRAST TECHNIQUE: Multiplanar, multisequence MR imaging of the right foot was performed. No intravenous contrast was administered. Osteomyelitis protocol MRI of the foot was obtained, to include the entire foot and ankle. This protocol uses a large field of view to cover the entire foot and ankle, and is suitable for assessing bony structures for osteomyelitis. Due to the large field of view and imaging plane choice, this protocol is less sensitive for assessing small structures such as ligamentous structures of the foot and ankle, compared  to a dedicated forefoot or dedicated hindfoot exam. COMPARISON:  04/29/2020 radiographs FINDINGS: Bones/Joint/Cartilage Abnormal osseous edema in the head of the fifth metatarsal with underlying cutaneous and subcutaneous ulceration, the appearance is highly suspicious for osteomyelitis. There is abnormal edema distally in the proximal phalanx of the small toe. On image 13 of series 7 this appears to probably be due to a transverse fracture of the distal head of the proximal phalanx. Given the proximity to the ulceration and the presume fifth metatarsal osteomyelitis, I cannot completely exclude osteomyelitis involving the proximal phalanx as an alternative cause for the edema and irregularity. Edema distally in the  calcaneus adjacent to the calcaneocuboid articulation is thought to be due to arthropathy rather than infection. Degenerative findings at the first MTP joint. Suspected red marrow centrally in the first metatarsal. Ligaments INSERT non Muscles and Tendons Diffuse edema along the plantar musculature of the foot could be from myositis or neurogenic edema. Thickened plantar fascia suggest plantar fasciitis. Soft tissues Focal cutaneous and subcutaneous ulceration along the plantar-lateral margin of the fifth MTP joint. Subcutaneous edema and possible ulceration plantar to the first MTP joint. Dorsal subcutaneous edema in the foot, cellulitis is not excluded. There is also some low-grade plantar subcutaneous edema distally in the forefoot. I do not observe a drainable abscess. IMPRESSION: 1. Abnormal osseous edema in the head of the fifth metatarsal with underlying cutaneous and subcutaneous ulceration, highly suspicious for osteomyelitis. 2. There is also abnormal edema distally in the proximal phalanx of the small toe, probably from a transverse fracture of the distal head of the proximal phalanx. I cannot completely exclude osteomyelitis involving the proximal phalanx of the small toe given the proximity to the ulceration and the presume cause for the edema and irregularity. 3. Plantar fasciitis. 4. Degenerative findings at the first MTP joint. 5. Diffuse edema along the plantar musculature of the foot could be from myositis or neurogenic edema. 6. Dorsal subcutaneous edema in the foot, cellulitis is not excluded. No drainable abscess. Electronically Signed   By: Van Clines M.D.   On: 04/30/2020 16:56    Medications: . sodium chloride    . sodium chloride    . ceFEPime (MAXIPIME) IV 1 g (05/01/20 2040)  . remdesivir 100 mg in NS 100 mL 100 mg (05/02/20 0920)  . vancomycin Stopped (04/30/20 1814)   . (feeding supplement) PROSource Plus  30 mL Oral BID BM  . amLODipine  10 mg Oral Daily  .  Chlorhexidine Gluconate Cloth  6 each Topical Q0600  . collagenase   Topical BID  . dexamethasone  6 mg Oral Daily  . famotidine  20 mg Oral BID  . feeding supplement (NEPRO CARB STEADY)  237 mL Oral BID BM  . fentaNYL (SUBLIMAZE) injection  100 mcg Intravenous Once  . heparin  5,000 Units Subcutaneous Q8H  . insulin aspart  5 Units Subcutaneous TID WC  . metoCLOPramide  5 mg Oral TID AC  . multivitamin  1 tablet Oral QHS  . phosphorus  250 mg Oral BID  . senna  1 tablet Oral BID  . sodium chloride flush  3 mL Intravenous Q12H    Dialysis Orders: [facility] - [facility], currently on MWF covid shift 4hrs, BFR400, DSK876, EDW 143.5kg,3K/2.5Ca  Access:[facility] Heparin3000 Mircera158mcg q2wks - last 12/8 Venofer 100mg  qHD x10 - completed 6  Problem/Plan:  1. b/l foot ulcers- MRI concerning for OM in digits of both feet. On 12/10 no surgical intervention indicated per Dr. Sharol Given. ABX started.  Plan per admit/ortho. 2. 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 Per admit. Plan for Hydotherapy / chem Debridement  on Vanco/ Cefepime  3. COVID PNA- tested positive on 11/30 initiplan  Per admit.  4. ESRD- Usually on HD TTS, but has run on MWF COVID shift this week, will continue on MWF COVID shift while admitted. Next HD on Monday unless urgent indication arise. K 3.8 today.  5. Hypertension/volume- BP mostlyin goal.Does not appear grossly volume overloaded. Has been getting under EDW at OP HD. Needs EDW lowered, continue to titrate down volume as tolerated.  6. Anemiaof CKD- Hgb 8.4. ESA just dosed 12/8. Hold iron d/t infection. 7. Secondary Hyperparathyroidism -CCa in goal.  Phos improved to 3.5 with supplementation. Not on VDRA or binders.  8. Nutrition- alb 1.5  Ok to use carb mod / w/fluid restrictions/ with low K, phos ,pro source and Nepro supplement  9. DM T 2 -  per admit     Jen Mow, PA-C [facility] 05/02/2020,10:55 AM   LOS: 3 days

## 2020-05-02 NOTE — Plan of Care (Signed)
  Problem: Coping: Goal: Psychosocial and spiritual needs will be supported Outcome: Progressing   Problem: Respiratory: Goal: Will maintain a patent airway Outcome: Progressing   Problem: Safety: Goal: Ability to remain free from injury will improve Outcome: Progressing   

## 2020-05-02 NOTE — Progress Notes (Signed)
HD#3 Subjective:  Overnight Events: No events overnight  Patient resting comfortably in bed.  States that he has some sacral pain but otherwise feels as though it is improved since admission.  He does not feel short of breath.  He denies chest pain, shortness of breath, lower extremity pain.  His appetite has improved and tolerated breakfast well this morning.  Objective:  Vital signs in last 24 hours: Vitals:   05/01/20 2346 05/02/20 0401 05/02/20 0700 05/02/20 1100  BP: (!) 156/65 138/85 (!) 158/95 128/80  Pulse: (!) 101  95 99  Resp: 18 18 14 14   Temp: 98.4 F (36.9 C) 98.6 F (37 C) 98.6 F (37 C) 98.3 F (36.8 C)  TempSrc: Oral Axillary Oral Oral  SpO2: 94% 94% 95%   Weight:  131.4 kg    Height:       Supplemental O2: Nasal Cannula SpO2: 95 % O2 Flow Rate (L/min): 1 L/min   Physical Exam:  Physical Exam Constitutional:      Appearance: Normal appearance.  HENT:     Head: Normocephalic and atraumatic.  Cardiovascular:     Rate and Rhythm: Normal rate.     Pulses: Normal pulses.     Heart sounds: Normal heart sounds.  Pulmonary:     Effort: Pulmonary effort is normal.     Breath sounds: Normal breath sounds.  Abdominal:     General: Bowel sounds are normal.     Palpations: Abdomen is soft.     Tenderness: There is no abdominal tenderness.  Musculoskeletal:        General: Normal range of motion.     Cervical back: Normal range of motion.     Right lower leg: No edema.     Left lower leg: No edema.  Skin:    General: Skin is warm and dry.     Comments: Chronic wounds on sacrum and lower extremities covered with gauze and boots respectively  Neurological:     Mental Status: He is alert and oriented to person, place, and time. Mental status is at baseline.  Psychiatric:        Mood and Affect: Mood normal.     Filed Weights   04/29/20 1110 05/02/20 0401  Weight: (!) 158.8 kg 131.4 kg     Intake/Output Summary (Last 24 hours) at 05/02/2020  1131 Last data filed at 05/02/2020 0001 Gross per 24 hour  Intake 485.4 ml  Output 100 ml  Net 385.4 ml   Net IO Since Admission: -362.84 mL [05/02/20 1131]  Pertinent Labs: CBC Latest Ref Rng & Units 05/02/2020 05/01/2020 04/30/2020  WBC 4.0 - 10.5 K/uL 6.7 8.0 6.5  Hemoglobin 13.0 - 17.0 g/dL 8.4(L) 8.7(L) 8.7(L)  Hematocrit 39.0 - 52.0 % 28.4(L) 28.0(L) 30.3(L)  Platelets 150 - 400 K/uL 270 218 227    CMP Latest Ref Rng & Units 05/02/2020 05/01/2020 04/30/2020  Glucose 70 - 99 mg/dL 196(H) 96 80  BUN 6 - 20 mg/dL 16 10 15   Creatinine 0.61 - 1.24 mg/dL 4.91(H) 3.71(H) 4.83(H)  Sodium 135 - 145 mmol/L 134(L) 135 137  Potassium 3.5 - 5.1 mmol/L 3.8 3.4(L) 3.6  Chloride 98 - 111 mmol/L 98 100 102  CO2 22 - 32 mmol/L 24 26 26   Calcium 8.9 - 10.3 mg/dL 7.6(L) 7.4(L) 7.2(L)  Total Protein 6.5 - 8.1 g/dL 5.9(L) 6.0(L) 6.0(L)  Total Bilirubin 0.3 - 1.2 mg/dL 0.9 0.6 0.8  Alkaline Phos 38 - 126 U/L 239(H) 213(H) 171(H)  AST 15 - 41 U/L 32 40 41  ALT 0 - 44 U/L 17 18 19     Imaging: No results found.  Assessment/Plan:   Active Problems:   Insulin dependent type 2 diabetes mellitus (HCC)   HTN (hypertension)   History of anemia due to chronic kidney disease   Sacral osteomyelitis (HCC)   Pressure injury of both heels, unstageable (HCC)   Pneumonia due to COVID-19 virus   ESRD (end stage renal disease) (HCC)   Avascular necrosis of first metatarsal, left foot (HCC)   Osteomyelitis of dorsal first metatarsal, left foot (HCC)   Decubitus ulcer of sacral region, unstageable ([place])   Morbid obesity ([place])   Patient Summary: 49 y/o male withhistory of HTN, DMon insulin, ESRD, Anemiawho presents from [facility] because the staff stated that he wasnt eating the way he usually does with recent diagnosis of [place] on 03/24/2020 admitted for [place] with concern for sepsis from covid pneumonia, sacral, or foot wounds.    Sacral decubitus ulcer,  unstageable Coccygeal osteomyelitis Patient states that pain is improving.   - Continue hydrotherapy and chemical debridement  -On day 3 of antibiotics.  Continue broad-spectrum antibiotics with vancomycin, cefepime, and metronidazole -Blood cultures negative continue to follow daily. -We will reach out to infectious disease on Monday for long-term antibiotic recommendations. -Patient has order for air mattress since admission but continues to have regular mattress.  He needs an air mattress for offloading pressure from his sacral wound.  Pressure injury of bilateral heels, unstagable Dry gangrene of left 4th toe -Continue broad-spectrum antibiotics -You offloading with padded boots.  COVID pneumonia: -Continue to monitor O2 and trend CRP, ferritin, D-dimer -Continue remdesivir (3/5) -Continue dexamethasone (2/10) will consider discontinuing this today or tomorrow  ESRD on HD Patient tolerated dialysis today. -Appreciate nephrology's assistance.  Diabetes Mellitus -CBG monitoring every 4 hours -NovoLog 5 units with meals  Diet: Carb/Renal IVF: none VTE: Heparin Code: Full PT/OT recs: None ID: Day 3 of vancomycin, cefepime, metronidazole; day 3 of remdesivir  Dispo: Anticipated discharge to Skilled nursing facility pending improvement.  Lawerance Cruel, D.O.  Internal Medicine Resident, PGY-2 [facility]  Pager: [phone] 11:31 AM, 05/02/2020   Please contact the on call pager after 5 pm and on weekends at [phone].

## 2020-05-03 DIAGNOSIS — Z794 Long term (current) use of insulin: Secondary | ICD-10-CM

## 2020-05-03 DIAGNOSIS — N186 End stage renal disease: Secondary | ICD-10-CM

## 2020-05-03 DIAGNOSIS — M4628 Osteomyelitis of vertebra, sacral and sacrococcygeal region: Secondary | ICD-10-CM

## 2020-05-03 DIAGNOSIS — E119 Type 2 diabetes mellitus without complications: Secondary | ICD-10-CM

## 2020-05-03 DIAGNOSIS — L89150 Pressure ulcer of sacral region, unstageable: Secondary | ICD-10-CM

## 2020-05-03 DIAGNOSIS — U071 COVID-19: Secondary | ICD-10-CM

## 2020-05-03 LAB — CBC WITH DIFFERENTIAL/PLATELET
Abs Immature Granulocytes: 0.16 10*3/uL — ABNORMAL HIGH (ref 0.00–0.07)
Basophils Absolute: 0 10*3/uL (ref 0.0–0.1)
Basophils Relative: 0 %
Eosinophils Absolute: 0 10*3/uL (ref 0.0–0.5)
Eosinophils Relative: 0 %
HCT: 27.9 % — ABNORMAL LOW (ref 39.0–52.0)
Hemoglobin: 8.4 g/dL — ABNORMAL LOW (ref 13.0–17.0)
Immature Granulocytes: 2 %
Lymphocytes Relative: 9 %
Lymphs Abs: 0.9 10*3/uL (ref 0.7–4.0)
MCH: 24.3 pg — ABNORMAL LOW (ref 26.0–34.0)
MCHC: 30.1 g/dL (ref 30.0–36.0)
MCV: 80.9 fL (ref 80.0–100.0)
Monocytes Absolute: 0.5 10*3/uL (ref 0.1–1.0)
Monocytes Relative: 5 %
Neutro Abs: 8.1 10*3/uL — ABNORMAL HIGH (ref 1.7–7.7)
Neutrophils Relative %: 84 %
Platelets: 263 10*3/uL (ref 150–400)
RBC: 3.45 MIL/uL — ABNORMAL LOW (ref 4.22–5.81)
RDW: 16.7 % — ABNORMAL HIGH (ref 11.5–15.5)
Smear Review: ADEQUATE
WBC: 9.6 10*3/uL (ref 4.0–10.5)
nRBC: 0 % (ref 0.0–0.2)

## 2020-05-03 LAB — COMPREHENSIVE METABOLIC PANEL
ALT: 18 U/L (ref 0–44)
AST: 33 U/L (ref 15–41)
Albumin: 1.5 g/dL — ABNORMAL LOW (ref 3.5–5.0)
Alkaline Phosphatase: 326 U/L — ABNORMAL HIGH (ref 38–126)
Anion gap: 11 (ref 5–15)
BUN: 28 mg/dL — ABNORMAL HIGH (ref 6–20)
CO2: 23 mmol/L (ref 22–32)
Calcium: 7.5 mg/dL — ABNORMAL LOW (ref 8.9–10.3)
Chloride: 97 mmol/L — ABNORMAL LOW (ref 98–111)
Creatinine, Ser: 5.95 mg/dL — ABNORMAL HIGH (ref 0.61–1.24)
GFR, Estimated: 11 mL/min — ABNORMAL LOW (ref >60)
Glucose, Bld: 347 mg/dL — ABNORMAL HIGH (ref 70–99)
Potassium: 4.2 mmol/L (ref 3.5–5.1)
Sodium: 131 mmol/L — ABNORMAL LOW (ref 135–145)
Total Bilirubin: 0.6 mg/dL (ref 0.3–1.2)
Total Protein: 6.3 g/dL — ABNORMAL LOW (ref 6.5–8.1)

## 2020-05-03 LAB — GLUCOSE, CAPILLARY
Glucose-Capillary: 296 mg/dL — ABNORMAL HIGH (ref 70–99)
Glucose-Capillary: 209 mg/dL — ABNORMAL HIGH (ref 70–99)
Glucose-Capillary: 267 mg/dL — ABNORMAL HIGH (ref 70–99)
Glucose-Capillary: 249 mg/dL — ABNORMAL HIGH (ref 70–99)

## 2020-05-03 LAB — C-REACTIVE PROTEIN: CRP: 6.6 mg/dL — ABNORMAL HIGH (ref <1.0)

## 2020-05-03 LAB — PHOSPHORUS: Phosphorus: 3.4 mg/dL (ref 2.5–4.6)

## 2020-05-03 LAB — MAGNESIUM: Magnesium: 1.8 mg/dL (ref 1.7–2.4)

## 2020-05-03 LAB — FERRITIN: Ferritin: 906 ng/mL — ABNORMAL HIGH (ref 24–336)

## 2020-05-03 LAB — D-DIMER, QUANTITATIVE: D-Dimer, Quant: 3.7 ug/mL-FEU — ABNORMAL HIGH (ref 0.00–0.50)

## 2020-05-03 MED ORDER — SILVER NITRATE-POT NITRATE 75-25 % EX MISC
1.0000 | CUTANEOUS | Status: AC
  Filled 2020-05-03: qty 1

## 2020-05-03 MED ORDER — HEPARIN SODIUM (PORCINE) 5000 UNIT/ML IJ SOLN: 5000.0000 [IU] | Freq: Three times a day (TID) | INTRAMUSCULAR | Status: DC

## 2020-05-03 MED ORDER — LIDOCAINE-EPINEPHRINE 2 %-1:100000 IJ SOLN
20.0000 mL | Freq: Once | INTRAMUSCULAR | Status: DC
  Filled 2020-05-03 (×2): qty 20

## 2020-05-03 MED ORDER — METRONIDAZOLE 500 MG PO TABS
500.0000 mg | ORAL_TABLET | Freq: Three times a day (TID) | ORAL | Status: DC
  Administered 2020-05-03 – 2020-05-04 (×3): 500 mg via ORAL
  Filled 2020-05-03 (×3): qty 1

## 2020-05-03 MED ORDER — SILVER NITRATE-POT NITRATE 75-25 % EX MISC
1.0000 | CUTANEOUS | Status: DC
  Filled 2020-05-03: qty 1

## 2020-05-03 MED ORDER — VANCOMYCIN HCL IN DEXTROSE 1-5 GM/200ML-% IV SOLN
INTRAVENOUS | Status: AC
  Administered 2020-05-03: 1000 mg via INTRAVENOUS
  Filled 2020-05-03: qty 200

## 2020-05-03 MED ORDER — HEPARIN SODIUM (PORCINE) 1000 UNIT/ML IJ SOLN
INTRAMUSCULAR | Status: AC
  Administered 2020-05-03: 3800 [IU] via INTRAVENOUS_CENTRAL
  Filled 2020-05-03: qty 4

## 2020-05-03 MED ORDER — LIDOCAINE HCL (PF) 2 % IJ SOLN
10.0000 mL | INTRAMUSCULAR | Status: AC
  Filled 2020-05-03: qty 10

## 2020-05-03 NOTE — Progress Notes (Signed)
CSW confirmed that [facility] is unable to provide hydrotherapy. Will continue to follow for medical readiness.  Bolton Canupp LCSW

## 2020-05-03 NOTE — Progress Notes (Addendum)
HD#4 Subjective:  Overnight Events: No events overnight  This morning at bedside, resting comfortably. States sacral pain is improving slowly. Not feeling short of breath on room air. No lower extremity pain.   Objective:  Vital signs in last 24 hours: Vitals:   05/02/20 1748 05/02/20 1956 05/03/20 0000 05/03/20 0406  BP:  125/78 130/81 (!) 149/84  Pulse:  99 100 (!) 103  Resp:  19 19 17   Temp:  98.3 F (36.8 C) 97.9 F (36.6 C) 98 F (36.7 C)  TempSrc:  Oral Oral Oral  SpO2: 92% 92% 93% 93%  Weight:      Height:       Supplemental O2: Room air SpO2: 93 % O2 Flow Rate (L/min): 1 L/min   Physical Exam:  Physical Exam Constitutional:      Appearance: Normal appearance.  HENT:     Head: Normocephalic and atraumatic.  Cardiovascular:     Rate and Rhythm: Normal rate.     Pulses: Normal pulses.     Heart sounds: Normal heart sounds.  Pulmonary:     Effort: Pulmonary effort is normal.     Breath sounds: Normal breath sounds.  Abdominal:     General: Bowel sounds are normal.     Palpations: Abdomen is soft.     Tenderness: There is no abdominal tenderness.  Musculoskeletal:        General: Normal range of motion.     Cervical back: Normal range of motion.     Right lower leg: No edema.     Left lower leg: No edema.  Skin:    General: Skin is warm and dry.     Comments: Chronic wounds on sacrum and lower extremities covered with gauze and boots respectively  Neurological:     Mental Status: He is alert and oriented to person, place, and time. Mental status is at baseline.  Psychiatric:        Mood and Affect: Mood normal.     Filed Weights   04/29/20 1110 05/02/20 0401  Weight: (!) 158.8 kg 131.4 kg    No intake or output data in the 24 hours ending 05/03/20 0546 Net IO Since Admission: -362.84 mL [05/03/20 0546]  Pertinent Labs: CBC Latest Ref Rng & Units 05/03/2020 05/02/2020 05/01/2020  WBC 4.0 - 10.5 K/uL 9.6 6.7 8.0  Hemoglobin 13.0 - 17.0 g/dL  8.4(L) 8.4(L) 8.7(L)  Hematocrit 39.0 - 52.0 % 27.9(L) 28.4(L) 28.0(L)  Platelets 150 - 400 K/uL 263 270 218    CMP Latest Ref Rng & Units 05/03/2020 05/02/2020 05/01/2020  Glucose 70 - 99 mg/dL 347(H) 196(H) 96  BUN 6 - 20 mg/dL 28(H) 16 10  Creatinine 0.61 - 1.24 mg/dL 5.95(H) 4.91(H) 3.71(H)  Sodium 135 - 145 mmol/L 131(L) 134(L) 135  Potassium 3.5 - 5.1 mmol/L 4.2 3.8 3.4(L)  Chloride 98 - 111 mmol/L 97(L) 98 100  CO2 22 - 32 mmol/L 23 24 26   Calcium 8.9 - 10.3 mg/dL 7.5(L) 7.6(L) 7.4(L)  Total Protein 6.5 - 8.1 g/dL 6.3(L) 5.9(L) 6.0(L)  Total Bilirubin 0.3 - 1.2 mg/dL 0.6 0.9 0.6  Alkaline Phos 38 - 126 U/L 326(H) 239(H) 213(H)  AST 15 - 41 U/L 33 32 40  ALT 0 - 44 U/L 18 17 18     Imaging: No results found.  Assessment/Plan:   Active Problems:   Insulin dependent type 2 diabetes mellitus (HCC)   HTN (hypertension)   History of anemia due to chronic kidney disease  Sacral osteomyelitis (HCC)   Pressure injury of both heels, unstageable ([place])   Pneumonia due to COVID-19 virus   ESRD (end stage renal disease) ([place])   Avascular necrosis of first metatarsal, left foot (HCC)   Osteomyelitis of dorsal first metatarsal, left foot ([place])   Decubitus ulcer of sacral region, unstageable ([place])   Morbid obesity [facility])  Patient Summary: 49 y/o male withhistory of HTN, DMon insulin, ESRD, Anemiawho presents from [facility] because the staff stated that he wasnt eating the way he usually does with recent diagnosis of COVID on 03/24/2020 admitted for [place] with concern for sepsis from covid pneumonia, sacral, or foot wounds.   Sacral decubitus ulcer, unstageable Coccygeal osteomyelitis Pain continues to improve. Will discuss with TOC wound care options for discharge. -Continue hydrotherapy PT recommending 6 more days, continue  chemical debridement  -On day 4 of antibiotics.  Continue broad-spectrum antibiotics with vancomycin, cefepime, and metronidazole,  will discuss with ID plan for long term antibiotics. -Blood cultures remain negative continue to follow daily.  Pressure injury of bilateral heels, unstagable Dry gangrene of left 4th toe -Continue broad-spectrum antibiotics -You offloading with padded boots.  COVID pneumonia: Comfortable on room air. Afebrile overnight. Will discontinue glucocorticoids. -Continue to monitor O2 and trend CRP, ferritin, D-dimer -Continue remdesivir (4/5)  ESRD on HD Plan for HD today. -Appreciate nephrology's assistance. -Hold iron due to infection  Diabetes Mellitus CBG elevated in setting of glucocorticoids, will discontinue today -CBG monitoring every 4 hours -NovoLog 5 units with meals  Diet: Carb/Renal IVF: none VTE: Heparin Code: Full PT/OT recs: None ID: Day 4 of vancomycin, cefepime, metronidazole; day 4 of remdesivir  Dispo: Anticipated discharge to Skilled nursing facility pending improvement.  Iona Beard 5:46 AM, 05/03/2020  Pager: [phone]  Please contact the on call pager after 5 pm and on weekends at [phone].

## 2020-05-03 NOTE — Progress Notes (Signed)
Physical Therapy Wound Treatment [name] MRN: [id number] Date of Birth: 03/17/1971  Today's Date: 05/03/2020 Time: 1340-1605 Time Calculation (min): 145 min  Subjective  Subjective: Pleasant and agreeable to hydrotherapy Patient and Family Stated Goals: I really want to get healed up. Date of Onset:  (prior to admission) Prior Treatments: Dressing changes  Pain Score:  Pt denies pain throughout, only pressure.   Wound Assessment  Pressure Injury 05/01/20 Buttocks Left Stage 4 - Full thickness tissue loss with exposed bone, tendon or muscle. packed with santyl gauze, dressed by WOCN, (Active)  Wound Image   05/03/20 1622  Dressing Type Foam - Lift dressing to assess site every shift;Gauze (Comment) 05/03/20 1622  Dressing Changed;Intact;New drainage 05/03/20 1622  Dressing Change Frequency Daily 05/03/20 1622  State of Healing Early/partial granulation 05/03/20 1622  Site / Wound Assessment Pink;Yellow 05/03/20 1622  % Wound base Red or Granulating 40% 05/03/20 1622  % Wound base Yellow/Fibrinous Exudate 60% 05/03/20 1622  % Wound base Black/Eschar 0% 05/03/20 1622  % Wound base Other/Granulation Tissue (Comment) 0% 05/03/20 1622  Peri-wound Assessment Intact;Pink 05/03/20 1622  Wound Length (cm) 6 cm 05/01/20 1300  Wound Width (cm) 6.5 cm 05/01/20 1300  Wound Depth (cm) 3 cm 05/01/20 1300  Wound Surface Area (cm^2) 39 cm^2 05/01/20 1300  Wound Volume (cm^3) 117 cm^3 05/01/20 1300  Undermining (cm) 4 cm 12*, 3 cm 9*, 3 cm 3* 05/01/20 1300  Margins Unattached edges (unapproximated) 05/03/20 1622  Drainage Amount Minimal 05/03/20 1622  Drainage Description Other (Comment) 05/03/20 1622  Treatment Debridement (Selective);Hydrotherapy (Pulse lavage);Packing (Dry gauze);Other (Comment) 05/03/20 1622      Hydrotherapy Pulsed lavage therapy - wound location: L buttock wound Pulsed Lavage with Suction (psi): 12 psi Pulsed Lavage with Suction - Normal  Saline Used: 1000 mL Pulsed Lavage Tip: Tip with splash shield Selective Debridement Selective Debridement - Location: buttock wound Selective Debridement - Tools Used: Forceps;Scissors Selective Debridement - Tissue Removed: yellow unviable tissue   Wound Assessment and Plan  Wound Therapy - Assess/Plan/Recommendations Wound Therapy - Clinical Statement: Will, PA present to assess wound with PT at beginning of session. Bleeding noted during debridement which progressed to an unmanageable amount, prompting use of silver nitrate and call to Will to return. Bleeding stopped and PT to assess again with PA tomorrow morning. PT called [employer] rep who is to arrange for a longer bed to be delivered to pt tomorrow morning as his feet are resting on the footboard of the bed and head is at the very top of the bed. Will continue to follow. Wound Therapy - Functional Problem List: decrease mobility Factors Delaying/Impairing Wound Healing: Diabetes Mellitus;Altered sensation;Immobility Hydrotherapy Plan: Debridement;Dressing change;Patient/family education;Pulsatile lavage with suction Wound Therapy - Frequency: 6X / week Wound Therapy - Current Recommendations: PT Wound Therapy - Follow Up Recommendations: Skilled nursing facility Wound Plan: see above  Wound Therapy Goals- Improve the function of patient's integumentary system by progressing the wound(s) through the phases of wound healing (inflammation - proliferation - remodeling) by: Decrease Necrotic Tissue to: 15 Decrease Necrotic Tissue - Progress: Progressing toward goal Increase Granulation Tissue to: 85% Increase Granulation Tissue - Progress: Progressing toward goal Improve Drainage Characteristics: Min;Serous Improve Drainage Characteristics - Progress: Progressing toward goal Goals/treatment plan/discharge plan were made with and agreed upon by patient/family: Yes Time For Goal Achievement: 7 days Wound Therapy - Potential for Goals:  Good  Goals will be updated until maximal potential achieved or discharge criteria met.  Discharge criteria: when goals achieved, discharge from hospital, MD decision/surgical intervention, no progress towards goals, refusal/missing three consecutive treatments without notification or medical reason.  GP     [name] 05/03/2020, 4:36 PM   [name], PT, DPT Acute Rehabilitation Services Pager: [phone] Office: [phone]

## 2020-05-03 NOTE — Progress Notes (Addendum)
CC:  Poor appetite/sacral wound  Subjective: Stable picture below  Objective: Vital signs in last 24 hours: Temp:  [97.8 F (36.6 C)-98.9 F (37.2 C)] 97.9 F (36.6 C) (12/13 1147) Pulse Rate:  [99-109] 99 (12/13 1147) Resp:  [15-23] 18 (12/13 1147) BP: (125-173)/(71-123) 148/93 (12/13 1147) SpO2:  [92 %-97 %] 93 % (12/13 1147) Last BM Date: 05/03/20 100 p.o. recorded 2000 out on dialysis BM x1 No other intake or output recorded Afebrile, hypertensive blood pressure 148/93, or higher Glucose 347, creatinine 5.95, WBC 9.6 05/03/2020 H/H 8.4/27.9 Intake/Output from previous day: 12/12 0701 - 12/13 0700 In: -  Out: 1 [Stool:1] Intake/Output this shift: Total I/O In: 100 [P.O.:100] Out: 2001 [Other:2000; Stool:1]  General appearance: alert, cooperative and no distress Skin: Skin color, texture, turgor normal. No rashes or lesions or See picture for abnormal skin/decubitus below.  04/29/20  05/03/20  6 x 6.5 x 3 cm on 05/01/20 He still has a lot of white fibrous nonviable tissue at the base.  We had to clean it out with hydrotherapy and bedside debridement.  There is some undermining but it seems well controlled.  Lab Results:  Recent Labs    05/02/20 0357 05/03/20 0140  WBC 6.7 9.6  HGB 8.4* 8.4*  HCT 28.4* 27.9*  PLT 270 263    BMET Recent Labs    05/02/20 0357 05/03/20 0140  NA 134* 131*  K 3.8 4.2  CL 98 97*  CO2 24 23  GLUCOSE 196* 347*  BUN 16 28*  CREATININE 4.91* 5.95*  CALCIUM 7.6* 7.5*   PT/INR No results for input(s): LABPROT, INR in the last 72 hours.  Recent Labs  Lab 04/29/20 1859 04/30/20 0336 05/01/20 0342 05/02/20 0357 05/03/20 0140  AST 41 41 40 32 33  ALT 18 19 18 17 18   ALKPHOS 177* 171* 213* 239* 326*  BILITOT 0.7 0.8 0.6 0.9 0.6  PROT 6.0* 6.0* 6.0* 5.9* 6.3*  ALBUMIN 1.5* 1.5* 1.5* 1.4* 1.5*     Lipase  No results found for: LIPASE   Medications: . (feeding supplement) PROSource Plus  30 mL Oral BID BM  .  amLODipine  10 mg Oral Daily  . Chlorhexidine Gluconate Cloth  6 each Topical Q0600  . collagenase   Topical BID  . famotidine  20 mg Oral BID  . feeding supplement (NEPRO CARB STEADY)  237 mL Oral BID BM  . fentaNYL (SUBLIMAZE) injection  100 mcg Intravenous Once  . heparin  5,000 Units Subcutaneous Q8H  . insulin aspart  5 Units Subcutaneous TID WC  . metoCLOPramide  5 mg Oral TID AC  . metroNIDAZOLE  500 mg Oral Q8H  . multivitamin  1 tablet Oral QHS  . phosphorus  250 mg Oral BID  . senna  1 tablet Oral BID  . sodium chloride flush  3 mL Intravenous Q12H   . ceFEPime (MAXIPIME) IV 1 g (05/01/20 2040)  . vancomycin Stopped (05/03/20 1100)    Assessment/Plan Covid + 04/20/2020 with bilateral pneumonia End-stage renal disease on hemodialysis Insulin-dependent diabetes Hypertension Anemia  Sacral wound  -Resides [facility]  -Coccygeal osteomyelitis  FEN: Carb modified ID: Maxipime 12/9-12/11 DVT: Heparin Follow-up: TBD  Plan: Continue hydrotherapy and bedside debridement. He developed bleeding after the bedside debridement.  After a very long time it was controlled with Silver nitrate sticks, compression and then gelfoam.  Packed with with Gelfoam.  There should be a new suture set and lidocaine at the bedside if he  bleeds again.  Bleeding was at the 9 o'clock position, with the head being at 12 o'clock.  We will take down the dressing tomorrow, No more heparin tonight.       LOS: 4 days    [name] 05/03/2020 Please see Amion

## 2020-05-03 NOTE — Evaluation (Signed)
Physical Therapy Evaluation Patient Details Name: [name] MRN: [id number] DOB: [birth year] Today's Date: 05/03/2020   History of Present Illness  Pt is a 49 y/o male who presents from SNF with concern for sepsis from COVID PNA, sacral wound, or foot wounds. PMH significant for HTN, DM on insulin, ESRD, recent COVID diagnosis on 03/24/2020.  Clinical Impression  Pt admitted with above diagnosis. At the time of PT eval pt was able to perform bed mobility only with min guard to min assist and bed pad to aid in positioning. Pt is 6'6" and feet are resting against foot board of bed, increasing pt's risk for skin breakdown on his feet. Called [place] and he is to arrange a longer bed to accommodate the patient to be delivered 12/14 in the morning. Evaluation was overall limited due to complications from hydrotherapy and bleeding wound, however will continue to assess as able in following PT sessions. Anticipate d/c back to SNF is appropriate. Pt reports he was just beginning to take steps in the parallel bars with PT. Pt currently with functional limitations due to the deficits listed below (see PT Problem List). Pt will benefit from skilled PT to increase their independence and safety with mobility to allow discharge to the venue listed below.       Follow Up Recommendations SNF;Supervision/Assistance - 24 hour    Equipment Recommendations  None recommended by PT (TBD by next venue of care)    Recommendations for Other Services       Precautions / Restrictions Precautions Precautions: Fall Precaution Comments: Multiple wounds Restrictions Weight Bearing Restrictions: No      Mobility  Bed Mobility Overal bed mobility: Needs Assistance Bed Mobility: Rolling Rolling: Min guard; Min assist         General bed mobility comments: Pt able to roll and reposition himself in the bed with min guard assist and set-up. HOB slightly elevated. Bed pad used for min assist for  positioning at end of session.     Transfers                 General transfer comment: Not tested this session  Ambulation/Gait             General Gait Details: Not tested this session  Stairs            Wheelchair Mobility    Modified Rankin (Stroke Patients Only)       Balance                                             Pertinent Vitals/Pain Pain Assessment: No/denies pain    Home Living Family/patient expects to be discharged to:: Skilled nursing facility                 Additional Comments: [name]    Prior Function Level of Independence: Needs assistance   Gait / Transfers Assistance Needed: Pt reports he has not walked on his own in several months but is working with PT to start walking in the parallel bars.  ADL's / Homemaking Assistance Needed: Staff assisting with bathing, dressing. He is able to feed himself and brush his teeth per pt report        Hand Dominance   Dominant Hand: Right    Extremity/Trunk Assessment   Upper Extremity Assessment Upper Extremity Assessment: Defer to OT evaluation  Lower Extremity Assessment Lower Extremity Assessment: RLE deficits/detail;LLE deficits/detail RLE Deficits / Details: several black spots over R and L feet - ortho following. RLE Sensation: decreased light touch;history of peripheral neuropathy LLE Sensation: decreased light touch;history of peripheral neuropathy    Cervical / Trunk Assessment Cervical / Trunk Assessment: Other exceptions Cervical / Trunk Exceptions: Forward head posture and rounded shoulders  Communication      Cognition Arousal/Alertness: Awake/alert Behavior During Therapy: WFL for tasks assessed/performed Overall Cognitive Status: Impaired/Different from baseline Area of Impairment: Orientation;Memory;Following commands                 Orientation Level: Disoriented to;Place;Situation   Memory: Decreased short-term  memory Following Commands: Follows one step commands consistently;Follows one step commands with increased time              General Comments      Exercises     Assessment/Plan    PT Assessment Patient needs continued PT services  PT Problem List Decreased strength;Decreased activity tolerance;Decreased balance;Decreased mobility;Decreased cognition;Decreased knowledge of use of DME;Decreased safety awareness;Decreased knowledge of precautions;Cardiopulmonary status limiting activity;Impaired sensation;Decreased skin integrity       PT Treatment Interventions DME instruction;Gait training;Functional mobility training;Therapeutic activities;Therapeutic exercise;Balance training;Neuromuscular re-education;Cognitive remediation;Patient/family education;Wheelchair mobility training    PT Goals (Current goals can be found in the Care Plan section)  Acute Rehab PT Goals Patient Stated Goal: Heal wound PT Goal Formulation: With patient Time For Goal Achievement: 05/17/20 Potential to Achieve Goals: Good    Frequency Min 2X/week   Barriers to discharge        Co-evaluation               AM-PAC PT "6 Clicks" Mobility  Outcome Measure Help needed turning from your back to your side while in a flat bed without using bedrails?: None Help needed moving from lying on your back to sitting on the side of a flat bed without using bedrails?: A Lot Help needed moving to and from a bed to a chair (including a wheelchair)?: A Lot Help needed standing up from a chair using your arms (e.g., wheelchair or bedside chair)?: A Lot Help needed to walk in hospital room?: Total Help needed climbing 3-5 steps with a railing? : Total 6 Click Score: 12    End of Session   Activity Tolerance: Patient tolerated treatment well Patient left: in bed;with call bell/phone within reach Nurse Communication: Mobility status PT Visit Diagnosis: Muscle weakness (generalized) (M62.81);Difficulty in  walking, not elsewhere classified (R26.2)    Time: 3545-6256 PT Time Calculation (min) (ACUTE ONLY): 15 min   Charges:   PT Evaluation $PT Eval High Complexity: 1 High          [name], PT, DPT Acute Rehabilitation Services Pager: [phone] Office: [phone]   [name] 05/03/2020, 4:44 PM

## 2020-05-03 NOTE — Progress Notes (Signed)
OT Cancellation Note  Patient Details Name: [name] MRN: [id number] DOB: 12-24-1970   Cancelled Treatment:    Reason Eval/Treat Not Completed: Patient at procedure or test/ unavailable. HD. Will attempt later time.   Nayana Lenig,HILLARY 05/03/2020, 8:39 AM  Maurie Boettcher, OT/L   Acute OT Clinical Specialist Acute Rehabilitation Services Pager [phone] Office [phone]

## 2020-05-03 NOTE — Procedures (Signed)
   I was present at this dialysis session, have reviewed the session itself and made  appropriate changes Kelly Splinter MD [place] pager [phone]   05/03/2020, 4:25 PM

## 2020-05-03 NOTE — Progress Notes (Addendum)
Inpatient Diabetes Program Recommendations  AACE/ADA: New Consensus Statement on Inpatient Glycemic Control (2015)  Target Ranges:  Prepandial:   less than 140 mg/dL      Peak postprandial:   less than 180 mg/dL (1-2 hours)      Critically ill patients:  140 - 180 mg/dL   Lab Results  Component Value Date   GLUCAP 209 (H) 05/03/2020    Review of Glycemic Control Results for TRIP, CAVANAGH (MRN [id number]) as of 05/03/2020 15:10  Ref. Range 05/02/2020 11:15 05/02/2020 17:09 05/02/2020 20:43 05/03/2020 07:39 05/03/2020 11:43  Glucose-Capillary Latest Ref Range: 70 - 99 mg/dL 207 (H) 251 (H) 278 (H) 296 (H) 209 (H)   Inpatient Diabetes Program Recommendations:   Consider Novolog 0-9 units correction tid + 0-5 Secure chat sent to Dr. Marianna Payment.  Thank you, Nani Gasser. Vasilis Luhman, RN, MSN, CDE  Diabetes Coordinator Inpatient Glycemic Control Team Team Pager [phone] (8am-5pm) 05/03/2020 3:10 PM

## 2020-05-03 NOTE — Progress Notes (Signed)
[facility] Progress Note   Subjective:  Pt seen on HD on 5C. No c/o's today.   Objective Vitals:   05/03/20 1045 05/03/20 1101 05/03/20 1115 05/03/20 1147  BP: (!) 159/98 (!) 146/99 (!) 168/92 (!) 148/93  Pulse:    99  Resp: 15 20 (!) 23 18  Temp:  98.1 F (36.7 C)  97.9 F (36.6 C)  TempSrc:  Oral  Oral  SpO2:   97% 93%  Weight:      Height:       Physical Exam General:chroncially ill appearing male in NAD Heart:RRR, no mrg Lungs:mostly CTAB, BS decreased Abdomen:soft, NTND Extremities:trace edema b/l.  B/l feet/ankle wrapped  Dialysis Access: TDC    OP HD:  MWF (on COVID shift) , was TTS   4h  400/800  143.5kg  3K/2.5Ca  TDC  Hep 3000 Mircera138mcg q2wks - last 12/8 Venofer 100mg  qHD x10 - completed 6  Assessment/ Plan: 1. Bilat foot ulcers- MRI concerning for OM in digits of both feet. On 12/10 no surgical intervention indicated per Dr. Sharol Given. ABX started. Plan per admit/ortho. 2. 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 Per admit. Plan for Hydotherapy / chem Debridement  on Vanco/ Cefepime  3. COVID PNA- tested positive on 11/30.   Per admit.  4. ESRD- MWF HD now for COVID. Will return to TTS when off isolation.Next HD today.  5. Hypertension/volume- BP mostlyhigh to high-normal, has lost a lot of body wt. Down 13kg from prior dry wt. BP's still high, cont to lower vol w/ HD.  6. Anemiaof CKD- Hgb 8.4. ESA just dosed 12/8. Hold iron d/t infection. 7. Secondary Hyperparathyroidism -CCa in goal.  Phos improved to 3.5 with supplementation. Not on VDRA or binders.  8. Nutrition- alb 1.5  Ok to use carb mod / w/fluid restrictions/ with low K, phos ,pro source and Nepro supplement  9. DM T 2 -  per admit     Kelly Splinter, MD 05/03/2020, 4:13 PM       Filed Weights   04/29/20 1110 05/02/20 0401  Weight: (!) 158.8 kg 131.4 kg    Intake/Output Summary (Last 24 hours) at 05/03/2020 1612 Last data filed at  05/03/2020 1257 Gross per 24 hour  Intake 100 ml  Output 2002 ml  Net -1902 ml    Additional Objective Labs: Basic Metabolic Panel: Recent Labs  Lab 05/01/20 0342 05/02/20 0357 05/03/20 0140  NA 135 134* 131*  K 3.4* 3.8 4.2  CL 100 98 97*  CO2 26 24 23   GLUCOSE 96 196* 347*  BUN 10 16 28*  CREATININE 3.71* 4.91* 5.95*  CALCIUM 7.4* 7.6* 7.5*  PHOS 1.4* 3.5 3.4   Liver Function Tests: Recent Labs  Lab 05/01/20 0342 05/02/20 0357 05/03/20 0140  AST 40 32 33  ALT 18 17 18   ALKPHOS 213* 239* 326*  BILITOT 0.6 0.9 0.6  PROT 6.0* 5.9* 6.3*  ALBUMIN 1.5* 1.4* 1.5*   CBC: Recent Labs  Lab 04/29/20 1859 04/30/20 0336 05/01/20 0342 05/02/20 0357 05/03/20 0140  WBC 5.7 6.5 8.0 6.7 9.6  NEUTROABS 5.4 5.2 6.2 5.9 8.1*  HGB 8.3* 8.7* 8.7* 8.4* 8.4*  HCT 27.8* 30.3* 28.0* 28.4* 27.9*  MCV 85.8 86.3 82.8 83.5 80.9  PLT 242 227 218 270 263   Blood Culture    Component Value Date/Time   SDES BLOOD SITE NOT SPECIFIED 04/29/2020 1210   SPECREQUEST  04/29/2020 1210    BOTTLES DRAWN AEROBIC AND  ANAEROBIC Blood Culture adequate volume   CULT  04/29/2020 1210    NO GROWTH 4 DAYS Performed at [facility], [place] [address] [place], [place] [address]    REPTSTATUS PENDING 04/29/2020 1210   CBG: Recent Labs  Lab 05/02/20 1115 05/02/20 1709 05/02/20 2043 05/03/20 0739 05/03/20 1143  GLUCAP 207* 251* 278* 296* 209*   Iron Studies:  Recent Labs    05/03/20 0942  FERRITIN 906*   Lab Results  Component Value Date   INR 1.2 04/29/2020   Studies/Results: No results found.  Medications: . ceFEPime (MAXIPIME) IV 1 g (05/01/20 2040)  . vancomycin Stopped (05/03/20 1100)   . (feeding supplement) PROSource Plus  30 mL Oral BID BM  . amLODipine  10 mg Oral Daily  . Chlorhexidine Gluconate Cloth  6 each Topical Q0600  . collagenase   Topical BID  . famotidine  20 mg Oral BID  . feeding supplement (NEPRO CARB STEADY)  237 mL Oral BID BM  . fentaNYL  (SUBLIMAZE) injection  100 mcg Intravenous Once  . heparin  5,000 Units Subcutaneous Q8H  . insulin aspart  5 Units Subcutaneous TID WC  . lidocaine HCl (PF)  10 mL Intradermal STAT  . metoCLOPramide  5 mg Oral TID AC  . metroNIDAZOLE  500 mg Oral Q8H  . multivitamin  1 tablet Oral QHS  . phosphorus  250 mg Oral BID  . senna  1 tablet Oral BID  . silver nitrate applicators  1 Stick Topical STAT  . sodium chloride flush  3 mL Intravenous Q12H

## 2020-05-03 NOTE — Consult Note (Signed)
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  Also with positive test this admission on December 9.  Status post remdesivir x5 days, currently on room air  #Diabetes  #ESRD on HD  PLAN:     --Continue cefepime dosed for HD per pharmacy recs --Continue vancomycin dosed for HD per pharmacy recs --Continue metronidazole for now --Discuss with surgery vs IR regarding bone biopsy of sacral osteo to guide therapy --Discuss with ortho regarding MRI of his feet and any surgical intervention especially given MRI right foot findings --Weekly ESR/CRP --Check A1c --Defer to IP regarding appropriate duration of COVID precautions   MEDICATIONS:    Scheduled Meds: . (feeding supplement) PROSource Plus  30 mL Oral BID BM  . amLODipine  10 mg Oral Daily  . Chlorhexidine Gluconate Cloth  6 each Topical Q0600  . collagenase   Topical BID  . famotidine  20 mg Oral BID  . feeding supplement (NEPRO CARB STEADY)  237 mL Oral BID BM  . fentaNYL (SUBLIMAZE) injection  100 mcg Intravenous Once  . heparin  5,000 Units Subcutaneous Q8H  . insulin aspart  5 Units Subcutaneous TID WC  . metoCLOPramide  5 mg Oral TID AC  . metroNIDAZOLE  500 mg Oral Q8H  . multivitamin  1 tablet Oral QHS  . phosphorus  250 mg Oral BID  . senna  1 tablet Oral BID  . sodium chloride flush  3 mL Intravenous Q12H    Continuous Infusions: . ceFEPime (MAXIPIME) IV 1  g (05/01/20 2040)  . vancomycin Stopped (05/03/20 1100)    PRN Meds: acetaminophen, ondansetron **OR** ondansetron (ZOFRAN) IV, oxyCODONE-acetaminophen  HPI:    Fred [name] is a 49 y.o. male with past medical history of hypertension, insulin-dependent diabetes, end-stage renal disease admitted April 29, 2020 from [name] after his nursing home staff said he was not eating and drinking the way he normally does.  On admission he reported having increased pain and drainage from his sacral wound that had been present for several months.  He had also tested positive for COVID-19 on November 30; not previously vaccinated.  This coincided with mildly increased sputum production without shortness of breath, loss of taste or smell, fevers, chest pain.  On admission he also noted pain in both feet from bilateral feet wounds.    In the emergency department he was found to be hypertensive, tachycardic, febrile.  COVID-19 was positive.  Chest x-ray showed diffuse bilateral airspace disease.  He was found to be saturating 97% on room air on admission.  He was given remdesivir but steroids were held initially.  He has now completed day #5 of remdesivir today and had a couple days of steroids.  He had MRIs of bilateral feet.  Left foot MRI showed an area of ulceration with nailbed irregularity at the first digit with findings that are suggestive of osteomyelitis involving the first digit distal tuft.  MRI right foot showed abnormal osseous edema in the head of the fifth metatarsal  with underlying cutaneous and subcutaneous ulceration, highly suspicious for osteomyelitis.  MRI pelvis showed findings suspicious for coccygeal osteomyelitis.  CRP 12.6 --> 6.6 today.  He was seen by general surgery for further evaluation of sacral wound.  No surgery indicated at this time per their consult note.  He was also seen by orthopedic surgery for bilateral foot ulcers who also felt that these were chronic  stable ulcers with no acute infection.  No indication for surgical intervention at this time per their consult note.  Patient also had lower extremity blood flow evaluated this admission with ABIs.  This showed no evidence of significant right lower extremity arterial disease.  Resting left ABI indicated mild left lower extremity arterial disease.  Since being admitted and started on antibiotics patient has been afebrile.  WBC 9.6 most recently.  Currently on cefepime, vancomycin.  Blood cultures drawn on admission are no growth at 4 days.   Past Medical History:  Diagnosis Date  . Chronic kidney disease    HD MWF  . Diabetes mellitus without complication ([place])   . Hypertension     Social History   Tobacco Use  . Smoking status: Never Smoker  . Smokeless tobacco: Never Used  Substance Use Topics  . Alcohol use: Never  . Drug use: Never    History reviewed. No pertinent family history.  Allergies  Allergen Reactions  . Atorvastatin Hives    NOT on MAR    Review of Systems  Constitutional: Negative for chills and fever.  HENT: Negative.   Eyes: Negative.   Respiratory: Positive for cough. Negative for shortness of breath.   Cardiovascular: Negative.   Gastrointestinal: Negative.   Genitourinary: Negative.   Musculoskeletal: Positive for back pain.  Skin: Negative for rash.    All other systems reviewed and are negative.  OBJECTIVE:   Blood pressure (!) 148/93, pulse 99, temperature 97.9 F (36.6 C), temperature source Oral, resp. rate 18, height 6' 6"  (1.981 m), weight 131.4 kg, SpO2 93 %. Body mass index is 33.48 kg/m.  Physical Exam Constitutional:      Comments: Obese male, lying in bed, getting wound therapy, NAD.   HENT:     Head: Normocephalic and atraumatic.  Pulmonary:     Effort: Pulmonary effort is normal. No respiratory distress.     Comments: Intermittently coughing.  Abdominal:     General: Abdomen is flat.     Palpations: Abdomen is soft.      Tenderness: There is no abdominal tenderness. There is no guarding or rebound.  Musculoskeletal:     Comments: Large sacral wound with minimal erythema. Right foot with necrotic ulceration of heel and 5th MTP Left foot with necrotic ulceration 4th toes and 5th MTP.  Decreased sensation bilaterally  Skin:    General: Skin is warm and dry.     Findings: No rash.  Neurological:     General: No focal deficit present.     Mental Status: He is oriented to person, place, and time.  Psychiatric:        Mood and Affect: Mood normal.        Behavior: Behavior normal.                   Lines: Right subclavian HD catheter   Lab Results & Microbiology Lab Results  Component Value Date   WBC 9.6 05/03/2020   HGB 8.4 (L) 05/03/2020   HCT 27.9 (L) 05/03/2020   MCV 80.9 05/03/2020   PLT  263 05/03/2020    Lab Results  Component Value Date   NA 131 (L) 05/03/2020   K 4.2 05/03/2020   CO2 23 05/03/2020   GLUCOSE 347 (H) 05/03/2020   BUN 28 (H) 05/03/2020   CREATININE 5.95 (H) 05/03/2020   CALCIUM 7.5 (L) 05/03/2020   GFRNONAA 11 (L) 05/03/2020    Lab Results  Component Value Date   ALT 18 05/03/2020   AST 33 05/03/2020   ALKPHOS 326 (H) 05/03/2020   BILITOT 0.6 05/03/2020    C-Reactive Protein     Component Value Date/Time   CRP 6.6 (H) 05/03/2020 0942    Erythrocyte Sedimentation Rate  No results found for: ESRSEDRATE    I have reviewed the micro and lab results in Epic.  Imaging MRI pelvis 04/29/2020: IMPRESSION: Findings suspicious for coccygeal osteomyelitis in the presence of overlying soft tissue sacral ulceration and cellulitis.  MRI left foot 04/29/2020: IMPRESSION: Area of ulceration with nailbed irregularity at the first digit with findings that are suggestive of osteomyelitis involving the first digit distal tuft. No loculated fluid collections or sinus tract.  Area of signal abnormality in the first metatarsal shaft which could be due to  avascular necrosis/reactive marrow.  MRI right foot 04/29/2020: IMPRESSION: 1. Abnormal osseous edema in the head of the fifth metatarsal with underlying cutaneous and subcutaneous ulceration, highly suspicious for osteomyelitis. 2. There is also abnormal edema distally in the proximal phalanx of the small toe, probably from a transverse fracture of the distal head of the proximal phalanx. I cannot completely exclude osteomyelitis involving the proximal phalanx of the small toe given the proximity to the ulceration and the presume cause for the edema and irregularity. 3. Plantar fasciitis. 4. Degenerative findings at the first MTP joint. 5. Diffuse edema along the plantar musculature of the foot could be from myositis or neurogenic edema. 6. Dorsal subcutaneous edema in the foot, cellulitis is not excluded. No drainable abscess.  Raynelle Highland for Infectious Disease [facility] [phone] pager 05/03/2020, 1:34 PM   I have spent a total of 110 minutes with the patient reviewing hospital notes,  test results, labs and examining the patient as well as establishing an assessment and plan.

## 2020-05-04 LAB — GLUCOSE, CAPILLARY
Glucose-Capillary: 176 mg/dL — ABNORMAL HIGH (ref 70–99)
Glucose-Capillary: 187 mg/dL — ABNORMAL HIGH (ref 70–99)
Glucose-Capillary: 193 mg/dL — ABNORMAL HIGH (ref 70–99)
Glucose-Capillary: 202 mg/dL — ABNORMAL HIGH (ref 70–99)
Glucose-Capillary: 212 mg/dL — ABNORMAL HIGH (ref 70–99)
Glucose-Capillary: 221 mg/dL — ABNORMAL HIGH (ref 70–99)

## 2020-05-04 LAB — HEMOGLOBIN A1C
Hgb A1c MFr Bld: 5.6 % (ref 4.8–5.6)
Mean Plasma Glucose: 114.02 mg/dL

## 2020-05-04 LAB — COMPREHENSIVE METABOLIC PANEL
ALT: 24 U/L (ref 0–44)
AST: 41 U/L (ref 15–41)
Albumin: 1.5 g/dL — ABNORMAL LOW (ref 3.5–5.0)
Alkaline Phosphatase: 375 U/L — ABNORMAL HIGH (ref 38–126)
Anion gap: 10 (ref 5–15)
BUN: 26 mg/dL — ABNORMAL HIGH (ref 6–20)
CO2: 27 mmol/L (ref 22–32)
Calcium: 7.7 mg/dL — ABNORMAL LOW (ref 8.9–10.3)
Chloride: 99 mmol/L (ref 98–111)
Creatinine, Ser: 4.42 mg/dL — ABNORMAL HIGH (ref 0.61–1.24)
GFR, Estimated: 16 mL/min — ABNORMAL LOW (ref >60)
Glucose, Bld: 225 mg/dL — ABNORMAL HIGH (ref 70–99)
Potassium: 3.2 mmol/L — ABNORMAL LOW (ref 3.5–5.1)
Sodium: 136 mmol/L (ref 135–145)
Total Bilirubin: 0.8 mg/dL (ref 0.3–1.2)
Total Protein: 6 g/dL — ABNORMAL LOW (ref 6.5–8.1)

## 2020-05-04 LAB — CBC WITH DIFFERENTIAL/PLATELET
Abs Immature Granulocytes: 0.23 10*3/uL — ABNORMAL HIGH (ref 0.00–0.07)
Basophils Absolute: 0 10*3/uL (ref 0.0–0.1)
Basophils Relative: 0 %
Eosinophils Absolute: 0 10*3/uL (ref 0.0–0.5)
Eosinophils Relative: 0 %
HCT: 25.8 % — ABNORMAL LOW (ref 39.0–52.0)
Hemoglobin: 8.2 g/dL — ABNORMAL LOW (ref 13.0–17.0)
Immature Granulocytes: 3 %
Lymphocytes Relative: 11 %
Lymphs Abs: 0.9 10*3/uL (ref 0.7–4.0)
MCH: 25.3 pg — ABNORMAL LOW (ref 26.0–34.0)
MCHC: 31.8 g/dL (ref 30.0–36.0)
MCV: 79.6 fL — ABNORMAL LOW (ref 80.0–100.0)
Monocytes Absolute: 0.8 10*3/uL (ref 0.1–1.0)
Monocytes Relative: 10 %
Neutro Abs: 6.2 10*3/uL (ref 1.7–7.7)
Neutrophils Relative %: 76 %
Platelets: 286 10*3/uL (ref 150–400)
RBC: 3.24 MIL/uL — ABNORMAL LOW (ref 4.22–5.81)
RDW: 17.1 % — ABNORMAL HIGH (ref 11.5–15.5)
WBC: 8.1 10*3/uL (ref 4.0–10.5)
nRBC: 0 % (ref 0.0–0.2)

## 2020-05-04 LAB — CULTURE, BLOOD (ROUTINE X 2)
Culture: NO GROWTH
Culture: NO GROWTH
Special Requests: ADEQUATE
Special Requests: ADEQUATE

## 2020-05-04 LAB — C-REACTIVE PROTEIN: CRP: 3.6 mg/dL — ABNORMAL HIGH (ref <1.0)

## 2020-05-04 LAB — SEDIMENTATION RATE: Sed Rate: 35 mm/hr — ABNORMAL HIGH (ref 0–16)

## 2020-05-04 LAB — D-DIMER, QUANTITATIVE: D-Dimer, Quant: 4.52 ug/mL-FEU — ABNORMAL HIGH (ref 0.00–0.50)

## 2020-05-04 LAB — PHOSPHORUS: Phosphorus: 3.4 mg/dL (ref 2.5–4.6)

## 2020-05-04 LAB — MAGNESIUM: Magnesium: 1.6 mg/dL — ABNORMAL LOW (ref 1.7–2.4)

## 2020-05-04 LAB — FERRITIN: Ferritin: 1436 ng/mL — ABNORMAL HIGH (ref 24–336)

## 2020-05-04 MED ORDER — MELATONIN 3 MG PO TABS
3.0000 mg | ORAL_TABLET | Freq: Once | ORAL | Status: AC
  Administered 2020-05-04: 22:00:00 3 mg via ORAL
  Filled 2020-05-04: qty 1

## 2020-05-04 MED ORDER — INSULIN ASPART 100 UNIT/ML ~~LOC~~ SOLN
0.0000 [IU] | Freq: Every day | SUBCUTANEOUS | Status: DC
  Administered 2020-05-05 – 2020-05-12 (×2): 2 [IU] via SUBCUTANEOUS
  Administered 2020-05-13 – 2020-05-16 (×2): 3 [IU] via SUBCUTANEOUS
  Administered 2020-05-17 – 2020-05-22 (×3): 2 [IU] via SUBCUTANEOUS
  Administered 2020-05-24: 4 [IU] via SUBCUTANEOUS
  Administered 2020-05-25 – 2020-05-26 (×2): 2 [IU] via SUBCUTANEOUS

## 2020-05-04 MED ORDER — INSULIN ASPART 100 UNIT/ML ~~LOC~~ SOLN
0.0000 [IU] | Freq: Three times a day (TID) | SUBCUTANEOUS | Status: DC
  Administered 2020-05-04: 19:00:00 2 [IU] via SUBCUTANEOUS
  Administered 2020-05-05: 18:00:00 5 [IU] via SUBCUTANEOUS
  Administered 2020-05-05: 09:00:00 2 [IU] via SUBCUTANEOUS
  Administered 2020-05-06: 16:00:00 1 [IU] via SUBCUTANEOUS
  Administered 2020-05-06 (×2): 2 [IU] via SUBCUTANEOUS
  Administered 2020-05-07 – 2020-05-08 (×2): 1 [IU] via SUBCUTANEOUS
  Administered 2020-05-08 – 2020-05-09 (×4): 2 [IU] via SUBCUTANEOUS
  Administered 2020-05-10 (×2): 1 [IU] via SUBCUTANEOUS
  Administered 2020-05-11: 17:00:00 2 [IU] via SUBCUTANEOUS
  Administered 2020-05-11: 08:00:00 1 [IU] via SUBCUTANEOUS
  Administered 2020-05-11 – 2020-05-13 (×2): 2 [IU] via SUBCUTANEOUS
  Administered 2020-05-14 (×2): 1 [IU] via SUBCUTANEOUS
  Administered 2020-05-14 – 2020-05-15 (×4): 2 [IU] via SUBCUTANEOUS
  Administered 2020-05-16: 18:00:00 1 [IU] via SUBCUTANEOUS
  Administered 2020-05-17: 18:00:00 5 [IU] via SUBCUTANEOUS
  Administered 2020-05-17 (×2): 2 [IU] via SUBCUTANEOUS
  Administered 2020-05-18: 18:00:00 1 [IU] via SUBCUTANEOUS
  Administered 2020-05-19: 17:00:00 3 [IU] via SUBCUTANEOUS
  Administered 2020-05-19 – 2020-05-20 (×2): 2 [IU] via SUBCUTANEOUS
  Administered 2020-05-20 (×2): 3 [IU] via SUBCUTANEOUS
  Administered 2020-05-21 – 2020-05-23 (×4): 2 [IU] via SUBCUTANEOUS
  Administered 2020-05-24 (×2): 3 [IU] via SUBCUTANEOUS
  Administered 2020-05-24: 2 [IU] via SUBCUTANEOUS
  Administered 2020-05-25: 1 [IU] via SUBCUTANEOUS
  Administered 2020-05-25: 2 [IU] via SUBCUTANEOUS
  Administered 2020-05-26: 3 [IU] via SUBCUTANEOUS
  Administered 2020-05-26: 1 [IU] via SUBCUTANEOUS
  Administered 2020-05-26: 7 [IU] via SUBCUTANEOUS
  Administered 2020-05-27: 1 [IU] via SUBCUTANEOUS
  Administered 2020-05-27: 2 [IU] via SUBCUTANEOUS
  Administered 2020-05-28: 1 [IU] via SUBCUTANEOUS
  Administered 2020-05-28 – 2020-05-29 (×3): 2 [IU] via SUBCUTANEOUS
  Administered 2020-05-30 (×2): 1 [IU] via SUBCUTANEOUS
  Administered 2020-05-31 – 2020-06-01 (×2): 2 [IU] via SUBCUTANEOUS
  Administered 2020-06-02 – 2020-06-04 (×2): 1 [IU] via SUBCUTANEOUS
  Administered 2020-06-05: 2 [IU] via SUBCUTANEOUS
  Administered 2020-06-06 (×2): 1 [IU] via SUBCUTANEOUS

## 2020-05-04 MED ORDER — HEPARIN SODIUM (PORCINE) 5000 UNIT/ML IJ SOLN
INTRAMUSCULAR | Status: AC
  Filled 2020-05-04: qty 1

## 2020-05-04 MED ORDER — HEPARIN SODIUM (PORCINE) 5000 UNIT/ML IJ SOLN
5000.0000 [IU] | Freq: Three times a day (TID) | INTRAMUSCULAR | Status: DC
  Administered 2020-05-04 – 2020-06-02 (×82): 5000 [IU] via SUBCUTANEOUS
  Filled 2020-05-04 (×83): qty 1

## 2020-05-04 MED ORDER — HALOPERIDOL LACTATE 5 MG/ML IJ SOLN
1.0000 mg | Freq: Once | INTRAMUSCULAR | Status: AC
  Administered 2020-05-04: 02:00:00 1 mg via INTRAVENOUS
  Filled 2020-05-04: qty 1

## 2020-05-04 MED ORDER — MAGNESIUM SULFATE 2 GM/50ML IV SOLN
2.0000 g | Freq: Once | INTRAVENOUS | Status: AC
  Administered 2020-05-04: 09:00:00 2 g via INTRAVENOUS
  Filled 2020-05-04: qty 50

## 2020-05-04 NOTE — Progress Notes (Signed)
[facility] Progress Note   Subjective:   Patient not examined today directly given COVID-19 + status, utilizing data taken from chart +/- discussions w/ providers and staff.    Objective Vitals:   05/04/20 0000 05/04/20 0413 05/04/20 0751 05/04/20 1116  BP: 127/69 127/86 (!) 144/86 104/84  Pulse: 98 98 100 100  Resp: 17 20    Temp: 98 F (36.7 C) 98.2 F (36.8 C) 98.3 F (36.8 C) 98.8 F (37.1 C)  TempSrc: Oral Oral Oral Axillary  SpO2: 95% 96% 97% 98%  Weight:  133.8 kg    Height:       Physical Exam  Patient not examined today directly given COVID-19 + status, utilizing data taken from chart +/- discussions w/ providers and staff.     OP HD:  MWF (on COVID shift) , was TTS   4h  400/800  143.5kg  3K/2.5Ca  TDC  Hep 3000 Mircera141mcg q2wks - last 12/8 Venofer 100mg  qHD x10 - completed 6  Assessment/ Plan: 1. Bilat foot ulcers- MRI concerning for OM in digits of both feet. On 12/10 no surgical intervention indicated per Dr. Sharol Given. ABX started. Plan per admit/ortho. 2. 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 Per admit. Plan for Hydotherapy / chem Debridement  on Vanco/ Cefepime  3. COVID PNA- tested positive on 11/30.   Per admit.  4. ESRD- MWF HD now for COVID. Will return to TTS when off isolation.Next HD Wed.  5. Hypertension/volume- BP mostlyhigh to high-normal, has lost a lot of body wt. Down 10kg from prior dry wt. BP's normal now. Prob euvolemic now. 1-2 L UF planned.  6. Anemiaof CKD- Hgb 8.4. ESA just dosed 12/8. Hold iron d/t infection. 7. Secondary Hyperparathyroidism -CCa in goal.  Phos improved to 3.5 with supplementation. Not on VDRA or binders.  8. Nutrition- alb 1.5  Ok to use carb mod / w/fluid restrictions/ with low K, phos ,pro source and Nepro supplement  9. DM T 2 -  per admit     Kelly Splinter, MD 05/04/2020, 4:09 PM       Filed Weights   04/29/20 1110 05/02/20 0401 05/04/20 0413  Weight:  (!) 158.8 kg 131.4 kg 133.8 kg    Intake/Output Summary (Last 24 hours) at 05/04/2020 1609 Last data filed at 05/04/2020 1600 Gross per 24 hour  Intake 240 ml  Output 430 ml  Net -190 ml    Additional Objective Labs: Basic Metabolic Panel: Recent Labs  Lab 05/02/20 0357 05/03/20 0140 05/04/20 0546  NA 134* 131* 136  K 3.8 4.2 3.2*  CL 98 97* 99  CO2 24 23 27   GLUCOSE 196* 347* 225*  BUN 16 28* 26*  CREATININE 4.91* 5.95* 4.42*  CALCIUM 7.6* 7.5* 7.7*  PHOS 3.5 3.4 3.4   Liver Function Tests: Recent Labs  Lab 05/02/20 0357 05/03/20 0140 05/04/20 0546  AST 32 33 41  ALT 17 18 24   ALKPHOS 239* 326* 375*  BILITOT 0.9 0.6 0.8  PROT 5.9* 6.3* 6.0*  ALBUMIN 1.4* 1.5* 1.5*   CBC: Recent Labs  Lab 04/30/20 0336 05/01/20 0342 05/02/20 0357 05/03/20 0140 05/04/20 0546  WBC 6.5 8.0 6.7 9.6 8.1  NEUTROABS 5.2 6.2 5.9 8.1* 6.2  HGB 8.7* 8.7* 8.4* 8.4* 8.2*  HCT 30.3* 28.0* 28.4* 27.9* 25.8*  MCV 86.3 82.8 83.5 80.9 79.6*  PLT 227 218 270 263 286   Blood Culture    Component Value Date/Time   SDES BLOOD SITE  NOT SPECIFIED 04/29/2020 1210   SPECREQUEST  04/29/2020 1210    BOTTLES DRAWN AEROBIC AND ANAEROBIC Blood Culture adequate volume   CULT  04/29/2020 1210    NO GROWTH 5 DAYS Performed at [facility], [place] [address], [place], [place] [address]    REPTSTATUS 05/04/2020 FINAL 04/29/2020 1210   CBG: Recent Labs  Lab 05/03/20 1955 05/03/20 2359 05/04/20 0410 05/04/20 0810 05/04/20 1223  GLUCAP 249* 212* 221* 202* 193*   Iron Studies:  Recent Labs    05/04/20 0546  FERRITIN 1,436*   Lab Results  Component Value Date   INR 1.2 04/29/2020   Studies/Results: No results found.  Medications: . ceFEPime (MAXIPIME) IV Stopped (05/03/20 2215)  . vancomycin Stopped (05/03/20 1100)   . (feeding supplement) PROSource Plus  30 mL Oral BID BM  . amLODipine  10 mg Oral Daily  . Chlorhexidine Gluconate Cloth  6 each Topical Q0600  .  collagenase   Topical BID  . famotidine  20 mg Oral BID  . feeding supplement (NEPRO CARB STEADY)  237 mL Oral BID BM  . heparin      . heparin injection (subcutaneous)  5,000 Units Subcutaneous Q8H  . insulin aspart  0-5 Units Subcutaneous QHS  . insulin aspart  0-9 Units Subcutaneous TID WC  . insulin aspart  5 Units Subcutaneous TID WC  . lidocaine HCl (PF)  10 mL Intradermal STAT  . lidocaine-EPINEPHrine  20 mL Infiltration Once  . metoCLOPramide  5 mg Oral TID AC  . multivitamin  1 tablet Oral QHS  . senna  1 tablet Oral BID  . silver nitrate applicators  1 Stick Topical STAT  . sodium chloride flush  3 mL Intravenous Q12H

## 2020-05-04 NOTE — Progress Notes (Addendum)
CC:  Poor appetite/sacral wound  Subjective: Patient seen during hydrotherapy.  Objective: Vital signs in last 24 hours: Temp:  [97.9 F (36.6 C)-98.3 F (36.8 C)] 98.3 F (36.8 C) (12/14 0751) Pulse Rate:  [98-100] 100 (12/14 0751) Resp:  [15-23] 20 (12/14 0413) BP: (127-168)/(69-123) 144/86 (12/14 0751) SpO2:  [93 %-97 %] 97 % (12/14 0751) Weight:  [133.8 kg] 133.8 kg (12/14 0413) Last BM Date: 05/03/20 100 p.o. recorded 2000 out on dialysis BM x1 No other intake or output recorded Afebrile, hypertensive blood pressure 148/93, or higher Glucose 347, creatinine 5.95, WBC 9.6 05/03/2020 H/H 8.4/27.9 Intake/Output from previous day: 12/13 0701 - 12/14 0700 In: 100 [P.O.:100] Out: 2001 [Stool:1] Intake/Output this shift: Total I/O In: 240 [P.O.:240] Out: 430 [Urine:430]  General appearance: alert, cooperative and no distress Skin: Skin color, texture, turgor normal. No rashes or lesions   Did not take a photo today -- all packing was removed with no signs of bleeding.  Interval removal of some non-viable tissue on the wound border noted; there is still some fibrous nonviable tissue at the base of the wound, no necrosis. .  There is some undermining laterally and cephalad.   Lab Results:  Recent Labs    05/03/20 0140 05/04/20 0546  WBC 9.6 8.1  HGB 8.4* 8.2*  HCT 27.9* 25.8*  PLT 263 286    BMET Recent Labs    05/03/20 0140 05/04/20 0546  NA 131* 136  K 4.2 3.2*  CL 97* 99  CO2 23 27  GLUCOSE 347* 225*  BUN 28* 26*  CREATININE 5.95* 4.42*  CALCIUM 7.5* 7.7*   PT/INR No results for input(s): LABPROT, INR in the last 72 hours.  Recent Labs  Lab 04/30/20 0336 05/01/20 0342 05/02/20 0357 05/03/20 0140 05/04/20 0546  AST 41 40 32 33 41  ALT 19 18 17 18 24   ALKPHOS 171* 213* 239* 326* 375*  BILITOT 0.8 0.6 0.9 0.6 0.8  PROT 6.0* 6.0* 5.9* 6.3* 6.0*  ALBUMIN 1.5* 1.5* 1.4* 1.5* 1.5*     Lipase  No results found for: LIPASE    Medications: . (feeding supplement) PROSource Plus  30 mL Oral BID BM  . amLODipine  10 mg Oral Daily  . Chlorhexidine Gluconate Cloth  6 each Topical Q0600  . collagenase   Topical BID  . famotidine  20 mg Oral BID  . feeding supplement (NEPRO CARB STEADY)  237 mL Oral BID BM  . heparin      . heparin injection (subcutaneous)  5,000 Units Subcutaneous Q8H  . insulin aspart  5 Units Subcutaneous TID WC  . lidocaine HCl (PF)  10 mL Intradermal STAT  . lidocaine-EPINEPHrine  20 mL Infiltration Once  . metoCLOPramide  5 mg Oral TID AC  . metroNIDAZOLE  500 mg Oral Q8H  . multivitamin  1 tablet Oral QHS  . senna  1 tablet Oral BID  . silver nitrate applicators  1 Stick Topical STAT  . sodium chloride flush  3 mL Intravenous Q12H   . ceFEPime (MAXIPIME) IV Stopped (05/03/20 2215)  . magnesium sulfate bolus IVPB 2 g (05/04/20 0855)  . 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 Continue hydrotherapy, did not perform sharp debridement today due  to bleeding yesterday. He developed bleeding after the bedside debridement.  Bleeding yesterday was controlled with Silver nitrate sticks, compression and then gelfoam without any further bleeding on exam today,.  There is a suture set and lidocaine at the bedside if he bleeds again. Ok to resume DVT ppx with heparin.     LOS: 5 days    Jill Alexanders 05/04/2020 Please see Amion

## 2020-05-04 NOTE — Progress Notes (Signed)
Inpatient Diabetes Program Recommendations  AACE/ADA: New Consensus Statement on Inpatient Glycemic Control (2015)  Target Ranges:  Prepandial:   less than 140 mg/dL      Peak postprandial:   less than 180 mg/dL (1-2 hours)      Critically ill patients:  140 - 180 mg/dL   Lab Results  Component Value Date   GLUCAP 202 (H) 05/04/2020   HGBA1C 5.6 05/04/2020    Review of Glycemic Control Results for Fred [name], JABS (MRN [id number]) as of 05/04/2020 12:04  Ref. Range 05/03/2020 16:35 05/03/2020 19:55 05/03/2020 23:59 05/04/2020 04:10 05/04/2020 08:10  Glucose-Capillary Latest Ref Range: 70 - 99 mg/dL 267 (H) 249 (H) 212 (H) 221 (H) 202 (H)   Diabetes history: DM 2 Outpatient Diabetes medications:  Humalog 5-10 units tid and HS Current orders for Inpatient glycemic control:  Novolog 5 units tid with meals Inpatient Diabetes Program Recommendations:   Please add Novolog sensitive tid with meals and HS.  Also please add hold parameters to current Novolog 5 units tid with meals (hold if patient eats less than 50% or NPO).   Thanks  Fred Perl, RN, BC-ADM Inpatient Diabetes Coordinator Pager [phone] (8a-5p)

## 2020-05-04 NOTE — Progress Notes (Signed)
Received results from [facility] confirming that patient was positive for COVID on 04/20/20. Results placed in chart.

## 2020-05-04 NOTE — Progress Notes (Signed)
Pt increasingly confused and agitated at this time. Refusing telemetry and pulling at lines. Attempted to place tele back on pt and he became slightly combative. Provider made aware. 1 mg Haldol ordered.

## 2020-05-04 NOTE — Progress Notes (Signed)
Physical Therapy Wound Treatment Patient Details  Name: Flem Enderle MRN: [id number] Date of Birth: 07-10-1970  Today's Date: 05/04/2020 Time: 7282-0601 Time Calculation (min): 45 min  Subjective  Subjective: Admits to feeling more confused this morning. Patient and Family Stated Goals: I really want to get healed up. Date of Onset:  (prior to admission) Prior Treatments: Dressing changes  Pain Score:  Pt tolerated well  Wound Assessment  Pressure Injury 05/01/20 Buttocks Left Stage 4 - Full thickness tissue loss with exposed bone, tendon or muscle. packed with santyl gauze, dressed by WOCN, (Active)  Dressing Type ABD;Barrier Film (skin prep);Gauze (Comment) 05/04/20 1317  Dressing Changed;Clean;Dry;Intact 05/04/20 1317  Dressing Change Frequency Daily 05/04/20 1317  State of Healing Early/partial granulation 05/04/20 1317  Site / Wound Assessment Pale;Pink;Yellow 05/04/20 1317  % Wound base Red or Granulating 40% 05/04/20 1317  % Wound base Yellow/Fibrinous Exudate 60% 05/04/20 1317  % Wound base Black/Eschar 0% 05/04/20 1317  % Wound base Other/Granulation Tissue (Comment) 0% 05/04/20 1317  Peri-wound Assessment Intact;Pink 05/04/20 1317  Wound Length (cm) 6 cm 05/01/20 1300  Wound Width (cm) 6.5 cm 05/01/20 1300  Wound Depth (cm) 3 cm 05/01/20 1300  Wound Surface Area (cm^2) 39 cm^2 05/01/20 1300  Wound Volume (cm^3) 117 cm^3 05/01/20 1300  Undermining (cm) 4 cm 12*, 3 cm 9*, 3 cm 3* 05/01/20 1300  Margins Unattached edges (unapproximated) 05/04/20 1317  Drainage Amount Copious 05/04/20 1317  Drainage Description Sanguineous 05/04/20 1317  Treatment Hydrotherapy (Pulse lavage);Packing (Dry gauze) 05/04/20 1317   Santyl applied to wound bed prior to applying dressing.     Hydrotherapy Pulsed lavage therapy - wound location: L buttock wound Pulsed Lavage with Suction (psi): 12 psi Pulsed Lavage with Suction - Normal Saline Used: 1000 mL Pulsed Lavage Tip: Tip with  splash shield   Wound Assessment and Plan  Wound Therapy - Assess/Plan/Recommendations Wound Therapy - Clinical Statement: Assessed wound with Kathlee Nations, PA and no active bleeding noted when dressing was removed. Sharps debridement deferred this session and will reassess tomorrow for readiness for resumption of debridement. Will continue to follow. Wound Therapy - Functional Problem List: decreased mobility Factors Delaying/Impairing Wound Healing: Diabetes Mellitus;Altered sensation;Immobility Hydrotherapy Plan: Debridement;Dressing change;Patient/family education;Pulsatile lavage with suction Wound Therapy - Frequency: 6X / week Wound Therapy - Current Recommendations: PT Wound Therapy - Follow Up Recommendations: Skilled nursing facility Wound Plan: see above  Wound Therapy Goals- Improve the function of patient's integumentary system by progressing the wound(s) through the phases of wound healing (inflammation - proliferation - remodeling) by: Decrease Necrotic Tissue to: 15 Decrease Necrotic Tissue - Progress: Progressing toward goal Increase Granulation Tissue to: 85% Increase Granulation Tissue - Progress: Progressing toward goal Goals/treatment plan/discharge plan were made with and agreed upon by patient/family: Yes Time For Goal Achievement: 7 days Wound Therapy - Potential for Goals: Good  Goals will be updated until maximal potential achieved or discharge criteria met.  Discharge criteria: when goals achieved, discharge from hospital, MD decision/surgical intervention, no progress towards goals, refusal/missing three consecutive treatments without notification or medical reason.  GP     [name] 05/04/2020, 2:50 PM   [name], PT, DPT Acute Rehabilitation Services Pager: [phone] Office: [phone]

## 2020-05-04 NOTE — Progress Notes (Addendum)
Physical Therapy Treatment Patient Details Name: Fred [name] MRN: [id number] DOB: July 13, 1970 Today's Date: 05/04/2020    History of Present Illness Pt is a 49 y/o male who presents from SNF with concern for sepsis from COVID PNA, sacral wound, or foot wounds. Pt unable to state and unclear in chart as to why pt was in SNF for therapy.  MD present and reports trying to contact family to determine more detailed history. PMH significant for HTN, DM on insulin, ESRD, recent COVID diagnosis on 03/24/2020.    PT Comments    Pt was able to sit EOB today but unable to progress further due to symptomatic orthostatic hypotension that did not resolve after at least 5 mins. Required assist of 2 for safety.  Pt is confused and unable to provide PLOF or why he was in rehab other than "to learn to walk again." Will continue to progress as able.   *MD was able to speak with pt's sister who reports pt was functionally independent until September when he was hospitalized in [facility] for sepsis and renal failure.  States he was put on bedrest as a fall precaution as he fell in the hospital.  *OT also spoke with pt's sister who reports pt independent and driving in September.  States that they can provide 24 hr supervision in the future after rehab.  Updating recommendation to CIR as pt was independent, has potential for 24 hr support, and would benefit from intense therapy to progress.     Follow Up Recommendations  CIR    Equipment Recommendations  None recommended by PT (Defer to post acute)    Recommendations for Other Services       Precautions / Restrictions Precautions Precautions: Fall Precaution Comments: Multiple wounds; orthostatic hypotension    Mobility  Bed Mobility Overal bed mobility: Needs Assistance Bed Mobility: Rolling;Supine to Sit;Sit to Supine Rolling: Min assist   Supine to sit: Mod assist;+2 for physical assistance Sit to supine: +2 for physical assistance;Mod  assist   General bed mobility comments: Pt requiring cues for transfer sequencing and then mod A to lift trunk; for return to bed required mod A for trunk and legs.  Transfers                 General transfer comment: held due to orthostatic hypotension  Ambulation/Gait                 Stairs             Wheelchair Mobility    Modified Rankin (Stroke Patients Only)       Balance Overall balance assessment: Needs assistance Sitting-balance support: Bilateral upper extremity supported Sitting balance-Leahy Scale: Poor Sitting balance - Comments: Pt requiring use of UE; He was able to maintain balance with close guard to min A.                                    Cognition Arousal/Alertness: Awake/alert Behavior During Therapy: Flat affect Overall Cognitive Status: No family/caregiver present to determine baseline cognitive functioning Area of Impairment: Orientation;Problem solving;Following commands                 Orientation Level: Disoriented to;Place;Time;Situation   Memory: Decreased short-term memory Following Commands: Follows one step commands inconsistently     Problem Solving: Slow processing;Decreased initiation;Requires verbal cues;Requires tactile cues General Comments: Pt with flat affect.  He is also  confused and unable to answer questions in regards to medical history and PLOF>      Exercises General Exercises - Lower Extremity Ankle Circles/Pumps: AROM;Both;5 reps;Supine Long Arc Quad: AROM;Both;5 reps;Seated (limited motion)    General Comments General comments (skin integrity, edema, etc.):  Pt's O2 and HR stable.  He had c/o dizziness upon sitting that did not ease, BP was 104/84.  He was able to sit EOB for >5 mins but unable to progress further due to dizziness not improving. BP upon return to supine was 147/87.  Spent increased time positioining pt  upon return to supine for pressure relief and comfort.   Flexed knees of bed to keep pt's feet from hitting foot board - noted that longer bed requested by PT yesterday and rep was contacted. Pt positioned in R sidelying.      Pertinent Vitals/Pain Pain Assessment: No/denies pain    Home Living                      Prior Function            PT Goals (current goals can now be found in the care plan section) Acute Rehab PT Goals Patient Stated Goal: Heal wound; be able to walk PT Goal Formulation: With patient Time For Goal Achievement: 05/17/20 Potential to Achieve Goals: Good Progress towards PT goals: Progressing toward goals    Frequency    Min 2X/week      PT Plan Current plan remains appropriate    Co-evaluation PT/OT/SLP Co-Evaluation/Treatment: Yes Reason for Co-Treatment: Complexity of the patient's impairments (multi-system involvement);For patient/therapist safety PT goals addressed during session: Mobility/safety with mobility;Strengthening/ROM OT goals addressed during session: ADL's and self-care;Strengthening/ROM      AM-PAC PT "6 Clicks" Mobility   Outcome Measure  Help needed turning from your back to your side while in a flat bed without using bedrails?: None Help needed moving from lying on your back to sitting on the side of a flat bed without using bedrails?: A Lot Help needed moving to and from a bed to a chair (including a wheelchair)?: Total Help needed standing up from a chair using your arms (e.g., wheelchair or bedside chair)?: Total Help needed to walk in hospital room?: Total Help needed climbing 3-5 steps with a railing? : Total 6 Click Score: 10    End of Session   Activity Tolerance: Other (comment) (limited by orthostatic hypotension) Patient left: in bed;with call bell/phone within reach;with bed alarm set Nurse Communication: Mobility status;Other (comment) (changed seting on air matteress due to pt request (he did not like the alternating feature); pt positioned in R sidelying;  orthostatic BP in sitting) PT Visit Diagnosis: Muscle weakness (generalized) (M62.81);Difficulty in walking, not elsewhere classified (R26.2)     Time: 6160-7371 PT Time Calculation (min) (ACUTE ONLY): 26 min  Charges:  $Therapeutic Activity: 8-22 mins                     Abran Richard, PT Acute Rehab Services Pager [phone] [facility] 05/04/2020, 1:45 PM

## 2020-05-04 NOTE — Progress Notes (Addendum)
HD#5 Subjective:  Overnight Events: Agitated overnight give a dose of haloperidol   Patient this morning awake and working with PT. States he does not remember much of what happened last night. This morning continues to have some lingering confusion but not complaining of increased pain  Objective:  Vital signs in last 24 hours: Vitals:   05/03/20 1652 05/03/20 1957 05/04/20 0000 05/04/20 0413  BP: (!) 146/88 (!) 150/97 127/69 127/86  Pulse: 99 100 98 98  Resp: 18 20 17 20   Temp: 98.1 F (36.7 C) 98.1 F (36.7 C) 98 F (36.7 C) 98.2 F (36.8 C)  TempSrc: Oral Oral Oral Oral  SpO2: 94% 97% 95% 96%  Weight:    133.8 kg  Height:       Supplemental O2: Room air SpO2: 96 % O2 Flow Rate (L/min): 1 L/min   Physical Exam:  Physical Exam Constitutional:      Appearance: Normal appearance.  HENT:     Head: Normocephalic and atraumatic.  Cardiovascular:     Rate and Rhythm: Normal rate.     Pulses: Normal pulses.     Heart sounds: Normal heart sounds.  Pulmonary:     Effort: Pulmonary effort is normal.     Breath sounds: Normal breath sounds.  Abdominal:     General: Bowel sounds are normal.     Palpations: Abdomen is soft.     Tenderness: There is no abdominal tenderness.  Musculoskeletal:        General: Normal range of motion.     Cervical back: Normal range of motion.     Right lower leg: No edema.     Left lower leg: No edema.  Skin:    General: Skin is warm and dry.     Comments: Chronic wounds on sacrum and lower extremities covered with gauze and boots respectively  Neurological:     Mental Status: He is alert. Mental status is at baseline.     Comments: Delayed responses, oriented to person and situation  Psychiatric:        Mood and Affect: Mood normal.     Filed Weights   04/29/20 1110 05/02/20 0401 05/04/20 0413  Weight: (!) 158.8 kg 131.4 kg 133.8 kg     Intake/Output Summary (Last 24 hours) at 05/04/2020 0651 Last data filed at 05/03/2020  1257 Gross per 24 hour  Intake 100 ml  Output 2001 ml  Net -1901 ml   Net IO Since Admission: -2,264.84 mL [05/04/20 0651]  Pertinent Labs: CBC Latest Ref Rng & Units 05/04/2020 05/03/2020 05/02/2020  WBC 4.0 - 10.5 K/uL 8.1 9.6 6.7  Hemoglobin 13.0 - 17.0 g/dL 8.2(L) 8.4(L) 8.4(L)  Hematocrit 39.0 - 52.0 % 25.8(L) 27.9(L) 28.4(L)  Platelets 150 - 400 K/uL 286 263 270    CMP Latest Ref Rng & Units 05/03/2020 05/02/2020 05/01/2020  Glucose 70 - 99 mg/dL 347(H) 196(H) 96  BUN 6 - 20 mg/dL 28(H) 16 10  Creatinine 0.61 - 1.24 mg/dL 5.95(H) 4.91(H) 3.71(H)  Sodium 135 - 145 mmol/L 131(L) 134(L) 135  Potassium 3.5 - 5.1 mmol/L 4.2 3.8 3.4(L)  Chloride 98 - 111 mmol/L 97(L) 98 100  CO2 22 - 32 mmol/L 23 24 26   Calcium 8.9 - 10.3 mg/dL 7.5(L) 7.6(L) 7.4(L)  Total Protein 6.5 - 8.1 g/dL 6.3(L) 5.9(L) 6.0(L)  Total Bilirubin 0.3 - 1.2 mg/dL 0.6 0.9 0.6  Alkaline Phos 38 - 126 U/L 326(H) 239(H) 213(H)  AST 15 - 41 U/L 33 32  40  ALT 0 - 44 U/L 18 17 18    Imaging: No results found.  Assessment/Plan:   Active Problems:   Insulin dependent type 2 diabetes mellitus (HCC)   HTN (hypertension)   History of anemia due to chronic kidney disease   Sacral osteomyelitis (HCC)   Pressure injury of both heels, unstageable (HCC)   Pneumonia due to COVID-19 virus   ESRD (end stage renal disease) (HCC)   Avascular necrosis of first metatarsal, left foot (HCC)   Osteomyelitis of dorsal first metatarsal, left foot (HCC)   Decubitus ulcer of sacral region, unstageable ([place])   Morbid obesity ([place])  Patient Summary: 49 y/o male withhistory of HTN, DMon insulin, ESRD, Anemiawho presents from [facility] because the staff stated that he wasnt eating the way he usually does with recent diagnosis of [place] on 03/24/2020 admitted for [place] with concern for sepsis from covid pneumonia, sacral, or foot wounds.   Sacral decubitus ulcer, unstageable Coccygeal osteomyelitis Pain  continues to improve. Blood cultures remain negative. Had bleeding during hydrotherapy yesterday held heparin overnight will restart this morning -ID consulted, greatly appreciate reccomendations -On day 4 of antibiotics.  Continue broad-spectrum antibiotics with vancomycin, cefepime, will stop flagyl  -Continue hydrotherapy PT and chemical debridement    Pressure injury of bilateral heels, unstagable Dry gangrene of left 4th toe -Continue broad-spectrum antibiotics -offloading with padded boots.  COVID pneumonia: No new respiratory symptoms.  completed remdesivir x5 days  - Discussed with IP that patient can come off precautions if he is asymptomatic, pending COVID records from SNF  Acute Delirium Overnight patient confused, per sister have hallucinations of zebras and very anxious. Treated with 1 mg haldol, appears improved this morning but continued to have some residual confusion.  - Discontinue cardiac monitor - Frequent reorienting - Avoid centrally acting medications.  ESRD on HD -Appreciate nephrology's assistance. -Hold iron due to infection  Diabetes Mellitus CBG remains in 220s this morning.  -CBG monitoring every 4 hours -NovoLog 5 units with meals - SSI   Diet: Carb/Renal IVF: none VTE: Heparin Code: Full PT/OT recs: None ID: Day 4 of vancomycin, cefepime, metronidazole; day 4 of remdesivir  Dispo: Anticipated discharge to Skilled nursing facility pending improvement.  Iona Beard 6:51 AM, 05/04/2020  Pager: [phone]  Please contact the on call pager after 5 pm and on weekends at [phone].

## 2020-05-04 NOTE — Progress Notes (Signed)
Occupational Therapy Evaluation Patient Details Name: Fred [name] MRN: [id number] DOB: Sep 01, 1970 Today's Date: 05/04/2020    History of Present Illness 49 y.o. male with history of ESRD on HD (started 9/21), hypertension, diabetes, C. difficile presents to the ED from [facility] for evaluation of low oxygen saturations 82% on room air. Presents with concern for sepsis form Covid PNA, sacral wound and foot wounds. MRI obtained that showed coccygeal osteomyelitis with overlying soft tissue sacral ulceration and cellulitis.PMH: Pt admitted to [facility] due to gallbladder problems then discharged to [facility](14 days) for rehab then readmitted to [facility] then to [facility] for rehab.    Clinical Impression   PTA pt was undergoing rehab at [facility] due to hospitalizations since 01/2020. Per sister, pt had gone to [place] for rehab after his hospitalization at [facility], readmitted into hospital, then discharged to [facility] for rehab. Sister states he was "about ready to discharge home" when he got sick and was admitted to [facility]. Prior to that time, pt was independent with mobility and ADL, drove and managed his own medications. Sister states he was "always very strong". Session limited today due to apparent orthostatic hypotension. BP sitting 104/84; returned to supine BP 147/87 with complaints of dizziness. Required +2 mod A with bed mobility and requires Max A with ADL tasks. Given pt's prior level of function feel it is appropriate to consider rehab at [facility] to maximize functional level of independence with goal to DC home with 24/7 S of family. Sister states they live about an "hour away but are willing to do whatever her brother needs to get him stronger". Will follow acutely.    Follow Up Recommendations  SNF;Supervision/Assistance - 24 hour    Equipment Recommendations       Recommendations for Other Services       Precautions / Restrictions  Precautions Precautions: Fall Precaution Comments: Multiple wounds; orthostatic hypotension Required Braces or Orthoses: Other Brace Other Brace: B prevalon boots      Mobility Bed Mobility Overal bed mobility: Needs Assistance Bed Mobility: Rolling;Supine to Sit;Sit to Supine Rolling: Min assist   Supine to sit: Mod assist;+2 for physical assistance Sit to supine: +2 for physical assistance;Mod assist   General bed mobility comments: Pt requiring cues for transfer sequencing and then mod A to lift trunk; for return to bed required mod A for trunk and legs.    Transfers                 General transfer comment: held due to orthostatic hypotension    Balance Overall balance assessment: Needs assistance Sitting-balance support: Bilateral upper extremity supported Sitting balance-Leahy Scale: Poor Sitting balance - Comments: Pt requiring use of UE; He was able to maintain balance with close guard to min A.                                   ADL either performed or assessed with clinical judgement   ADL Overall ADL's : Needs assistance/impaired Eating/Feeding: Minimal assistance Eating/Feeding Details (indicate cue type and reason): May benefit form red tubing; lidded cup - will assess Grooming: Minimal assistance   Upper Body Bathing: Moderate assistance;Bed level   Lower Body Bathing: Maximal assistance;Bed level   Upper Body Dressing : Minimal assistance;Sitting;Bed level   Lower Body Dressing: Maximal assistance;Bed level       Toileting- Clothing Manipulation and Hygiene: Maximal assistance  Functional mobility during ADLs: +2 for physical assistance;Moderate assistance (bed mobility only)       Vision         Perception     Praxis      Pertinent Vitals/Pain Pain Assessment: Faces Faces Pain Scale: Hurts whole lot Pain Location: back Pain Descriptors / Indicators: Aching;Discomfort;Grimacing;Guarding;Moaning Pain  Intervention(s): Limited activity within patient's tolerance;Repositioned     Hand Dominance Right   Extremity/Trunk Assessment Upper Extremity Assessment Upper Extremity Assessment: Generalized weakness;RUE deficits/detail;LUE deficits/detail RUE Deficits / Details: limited R shoulder flexion to @ 70; HD subclavian catheter; atrophied intrinsics RUE Sensation: decreased light touch ("hands are numb") RUE Coordination: decreased fine motor;decreased gross motor LUE Deficits / Details: similar to R; shoulder flexion 2 80; wasting of intrinsics LUE Coordination: decreased fine motor;decreased gross motor   Lower Extremity Assessment Lower Extremity Assessment: Defer to PT evaluation   Cervical / Trunk Assessment Cervical / Trunk Assessment: Other exceptions Cervical / Trunk Exceptions: sacral wound`   Communication Communication Communication: No difficulties   Cognition Arousal/Alertness: Awake/alert Behavior During Therapy: Flat affect Overall Cognitive Status: No family/caregiver present to determine baseline cognitive functioning Area of Impairment: Orientation;Attention;Memory;Following commands;Safety/judgement;Awareness;Problem solving                 Orientation Level: Disoriented to;Time;Situation Current Attention Level: Sustained Memory: Decreased short-term memory Following Commands: Follows one step commands inconsistently Safety/Judgement: Decreased awareness of safety;Decreased awareness of deficits Awareness: Intellectual Problem Solving: Slow processing;Decreased initiation;Requires verbal cues;Requires tactile cues General Comments: Pt with flat affect.  He is also confused and unable to answer questions in regards to medical history and PLOF>   General Comments  Pt's O2 and HR stable.  He had c/o dizziness upon sitting that did not ease, BP was 104/84.  He was able to sit EOB for >5 mins but unable to progress further due to dizziness not improving. BP  upon return to supine was 147/87. Spent increased time positioining pt  upon return to supine for pressure relief and comfort.  Flexed knees of bed to keep pt's feet from hitting foot board - noted that longer bed requested by PT yesterday and rep was contacted. Pt positioned in R sidelying.    Exercises General Exercises - Lower Extremity Ankle Circles/Pumps: AROM;Both;5 reps;Supine Long Arc Quad: AROM;Both;5 reps;Seated (limited motion)   Shoulder Instructions      Home Living Family/patient expects to be discharged to:: Skilled nursing facility                                        Prior Functioning/Environment Level of Independence: Needs assistance  Gait / Transfers Assistance Needed: Pt reports he has not walked on his own in several months but is working with PT to start walking in the parallel bars. ADL's / Homemaking Assistance Needed: Staff assisting with bathing, dressing. He is able to feed himself and brush his teeth per pt report   Comments: from snf        OT Problem List: Decreased strength;Decreased range of motion;Impaired balance (sitting and/or standing);Decreased activity tolerance;Decreased coordination;Decreased cognition;Decreased safety awareness;Decreased knowledge of use of DME or AE;Cardiopulmonary status limiting activity;Obesity;Impaired sensation;Impaired UE functional use;Pain      OT Treatment/Interventions: Self-care/ADL training;Therapeutic exercise;Neuromuscular education;Energy conservation;DME and/or AE instruction;Therapeutic activities;Cognitive remediation/compensation;Patient/family education;Balance training    OT Goals(Current goals can be found in the care plan section) Acute Rehab OT Goals Patient Stated Goal:  Heal wound; be able to walk OT Goal Formulation: Patient unable to participate in goal setting Time For Goal Achievement: 05/18/20 Potential to Achieve Goals: Fair  OT Frequency: Min 2X/week   Barriers to D/C:             Co-evaluation PT/OT/SLP Co-Evaluation/Treatment: Yes Reason for Co-Treatment: Complexity of the patient's impairments (multi-system involvement);For patient/therapist safety;To address functional/ADL transfers PT goals addressed during session: Mobility/safety with mobility;Strengthening/ROM OT goals addressed during session: ADL's and self-care      AM-PAC OT "6 Clicks" Daily Activity     Outcome Measure Help from another person eating meals?: A Little Help from another person taking care of personal grooming?: A Little Help from another person toileting, which includes using toliet, bedpan, or urinal?: Total Help from another person bathing (including washing, rinsing, drying)?: A Lot Help from another person to put on and taking off regular upper body clothing?: A Little Help from another person to put on and taking off regular lower body clothing?: A Lot 6 Click Score: 14   End of Session Nurse Communication: Mobility status;Need for lift equipment;Other (comment) (will most likely need [place])  Activity Tolerance: Patient tolerated treatment well Patient left: in bed;with call bell/phone within reach;with bed alarm set  OT Visit Diagnosis: Other abnormalities of gait and mobility (R26.89);Muscle weakness (generalized) (M62.81);Other symptoms and signs involving cognitive function;Dizziness and giddiness (R42);Pain Pain - part of body:  (back)                Time: 1062-6948 OT Time Calculation (min): 23 min Charges:  OT General Charges $OT Visit: 1 Visit OT Evaluation $OT Eval Moderate Complexity: [place], OT/L   Acute OT Clinical Specialist [place] Pager [phone] Office [phone]   [facility] 05/04/2020, 2:30 PM

## 2020-05-04 NOTE — Progress Notes (Signed)
Patient has been refusing Q2 turns throughout the shift. Patient was educated regarding the importance of Q2 turns, especially with his current pressure injuries. Patient is on a rotational low air loss mattress.

## 2020-05-05 LAB — CBC
HCT: 23.6 % — ABNORMAL LOW (ref 39.0–52.0)
Hemoglobin: 7.6 g/dL — ABNORMAL LOW (ref 13.0–17.0)
MCH: 25.8 pg — ABNORMAL LOW (ref 26.0–34.0)
MCHC: 32.2 g/dL (ref 30.0–36.0)
MCV: 80 fL (ref 80.0–100.0)
Platelets: 211 10*3/uL (ref 150–400)
RBC: 2.95 MIL/uL — ABNORMAL LOW (ref 4.22–5.81)
RDW: 17.3 % — ABNORMAL HIGH (ref 11.5–15.5)
WBC: 8 10*3/uL (ref 4.0–10.5)
nRBC: 0 % (ref 0.0–0.2)

## 2020-05-05 LAB — GLUCOSE, CAPILLARY
Glucose-Capillary: 189 mg/dL — ABNORMAL HIGH (ref 70–99)
Glucose-Capillary: 130 mg/dL — ABNORMAL HIGH (ref 70–99)
Glucose-Capillary: 291 mg/dL — ABNORMAL HIGH (ref 70–99)
Glucose-Capillary: 216 mg/dL — ABNORMAL HIGH (ref 70–99)

## 2020-05-05 LAB — BASIC METABOLIC PANEL
Anion gap: 8 (ref 5–15)
BUN: 34 mg/dL — ABNORMAL HIGH (ref 6–20)
CO2: 27 mmol/L (ref 22–32)
Calcium: 7.7 mg/dL — ABNORMAL LOW (ref 8.9–10.3)
Chloride: 99 mmol/L (ref 98–111)
Creatinine, Ser: 4.99 mg/dL — ABNORMAL HIGH (ref 0.61–1.24)
GFR, Estimated: 13 mL/min — ABNORMAL LOW (ref >60)
Glucose, Bld: 173 mg/dL — ABNORMAL HIGH (ref 70–99)
Potassium: 3.3 mmol/L — ABNORMAL LOW (ref 3.5–5.1)
Sodium: 134 mmol/L — ABNORMAL LOW (ref 135–145)

## 2020-05-05 LAB — C-REACTIVE PROTEIN: CRP: 6 mg/dL — ABNORMAL HIGH (ref <1.0)

## 2020-05-05 LAB — D-DIMER, QUANTITATIVE: D-Dimer, Quant: 3.33 ug/mL-FEU — ABNORMAL HIGH (ref 0.00–0.50)

## 2020-05-05 MED ORDER — HEPARIN SODIUM (PORCINE) 1000 UNIT/ML IJ SOLN
INTRAMUSCULAR | Status: AC
  Administered 2020-05-05: 1000 [IU]
  Filled 2020-05-05: qty 7

## 2020-05-05 NOTE — Progress Notes (Signed)
HD#6 Subjective:  Overnight Events: Agitated overnight give a dose of haloperidol   Patient this morning awake at dialysis. Awake and alert mentation at baseline, No shortness of breath. States he his having more pain from his sacral wound. Objective:  Vital signs in last 24 hours: Vitals:   05/04/20 1619 05/04/20 2004 05/04/20 2247 05/05/20 0350  BP: (!) 148/77 (!) 152/82 (!) 152/80 134/70  Pulse: 97 97 97 98  Resp:  18 18 19   Temp: 97.8 F (36.6 C) 97.8 F (36.6 C) (!) 97.5 F (36.4 C) 97.7 F (36.5 C)  TempSrc: Oral Axillary Oral Axillary  SpO2: 98% 98% 98% 99%  Weight:    (!) 136.5 kg  Height:       Supplemental O2: Room air SpO2: 99 % O2 Flow Rate (L/min): 1 L/min   Physical Exam:  Physical Exam Constitutional:      Appearance: Normal appearance.  HENT:     Head: Normocephalic and atraumatic.  Cardiovascular:     Rate and Rhythm: Normal rate.     Pulses: Normal pulses.     Heart sounds: Normal heart sounds.  Pulmonary:     Effort: Pulmonary effort is normal.     Breath sounds: Normal breath sounds.  Abdominal:     General: Bowel sounds are normal.     Palpations: Abdomen is soft.     Tenderness: There is no abdominal tenderness.  Musculoskeletal:        General: Normal range of motion.     Cervical back: Normal range of motion.     Right lower leg: No edema.     Left lower leg: No edema.  Skin:    General: Skin is warm and dry.     Comments: Chronic wounds on sacrum and lower extremities covered with gauze and boots respectively  Neurological:     Mental Status: He is alert. Mental status is at baseline.     Comments: Delayed responses, oriented to person and situation  Psychiatric:        Mood and Affect: Mood normal.    Filed Weights   05/02/20 0401 05/04/20 0413 05/05/20 0350  Weight: 131.4 kg 133.8 kg (!) 136.5 kg     Intake/Output Summary (Last 24 hours) at 05/05/2020 0621 Last data filed at 05/04/2020 1841 Gross per 24 hour  Intake  480 ml  Output 430 ml  Net 50 ml   Net IO Since Admission: -2,214.84 mL [05/05/20 0621]  Pertinent Labs: CBC Latest Ref Rng & Units 05/05/2020 05/04/2020 05/03/2020  WBC 4.0 - 10.5 K/uL 8.0 8.1 9.6  Hemoglobin 13.0 - 17.0 g/dL 7.6(L) 8.2(L) 8.4(L)  Hematocrit 39.0 - 52.0 % 23.6(L) 25.8(L) 27.9(L)  Platelets 150 - 400 K/uL 211 286 263    CMP Latest Ref Rng & Units 05/05/2020 05/04/2020 05/03/2020  Glucose 70 - 99 mg/dL 173(H) 225(H) 347(H)  BUN 6 - 20 mg/dL 34(H) 26(H) 28(H)  Creatinine 0.61 - 1.24 mg/dL 4.99(H) 4.42(H) 5.95(H)  Sodium 135 - 145 mmol/L 134(L) 136 131(L)  Potassium 3.5 - 5.1 mmol/L 3.3(L) 3.2(L) 4.2  Chloride 98 - 111 mmol/L 99 99 97(L)  CO2 22 - 32 mmol/L 27 27 23   Calcium 8.9 - 10.3 mg/dL 7.7(L) 7.7(L) 7.5(L)  Total Protein 6.5 - 8.1 g/dL - 6.0(L) 6.3(L)  Total Bilirubin 0.3 - 1.2 mg/dL - 0.8 0.6  Alkaline Phos 38 - 126 U/L - 375(H) 326(H)  AST 15 - 41 U/L - 41 33  ALT 0 - 44  U/L - 24 18   Imaging: No results found.  Assessment/Plan:   Principal Problem:   Sacral osteomyelitis (HCC) Active Problems:   Insulin dependent type 2 diabetes mellitus (HCC)   HTN (hypertension)   History of anemia due to chronic kidney disease   Pressure injury of both heels, unstageable (HCC)   Pneumonia due to COVID-19 virus   ESRD (end stage renal disease) (HCC)   Avascular necrosis of first metatarsal, left foot (HCC)   Osteomyelitis of dorsal first metatarsal, left foot (HCC)   Decubitus ulcer of sacral region, unstageable ([place])   Morbid obesity ([place])  Patient Summary: 49 y/o male withhistory of HTN, DMon insulin, ESRD, Anemiawho presents from [facility] because the staff stated that he wasnt eating the way he usually does with recent diagnosis of [place] on 03/24/2020 admitted for [place] with concern for sepsis from covid pneumonia, sacral, or foot wounds.   Sacral decubitus ulcer, unstageable Coccygeal osteomyelitis Continuing with wound care  and antibiotics -On day 5 of antibiotics.  Continue broad-spectrum antibiotics with vancomycin, cefepime -Continue hydrotherapy PT and chemical debridement  - Has follow up with ID as outptaient    Pressure injury of bilateral heels, unstagable Dry gangrene of left 4th toe Osteomyelitis of left first metatarsal and right fifth metatarsal -Continue broad-spectrum antibiotics - Will discuss wit orthopedics need to surgical intervention given imaging concerning for osteomyelitis with both feet.   -offloading with padded boots.  COVID pneumonia: No new respiratory symptoms.  completed remdesivir x5 days  - Discussed with IP that patient can come off precautions if he is asymptomatic -Obtained record of positive test on 11/30, will discontinue airborne percautions  Acute Delirium Mentation at baseline more alert and awake today, oriented to person, time, and situation.  - Frequent reorienting - Avoid centrally acting medications.  ESRD on HD -Appreciate nephrology's assistance -dialysis today -Hold iron due to infection  Diabetes Mellitus Glucose improving this morning at 173.  -CBG monitoring every 4 hours -NovoLog 5 units with meals - SSI   Diet: Carb/Renal VTE: Heparin Code: Full PT/OT recs: None ID: Day 5 of vancomycin, cefepime  Dispo: Anticipated discharge to Skilled nursing facility pending improvement.  Iona Beard 6:21 AM, 05/05/2020  Pager: [phone]  Please contact the on call pager after 5 pm and on weekends at [phone].

## 2020-05-05 NOTE — Plan of Care (Signed)

## 2020-05-05 NOTE — Progress Notes (Signed)
    [facility] for Infectious Disease   Date of Admission:  04/29/2020     Reason for visit: Follow up on sacral osteo, bilateral foot wounds/osteo  Interval History: No acute events noted.  Patient taken off COVID-19 precautions after receiving date of positive test from nursing home.  No surgical intervention planned per general surgery or orthopedic surgery.  Remains afebrile, no leukocytosis.  Assessment:  #Sacral decubitus ulcer complicated by coccygeal osteomyelitis #Bilateral foot wounds concerning for osteomyelitis #Recent COVID-19 pneumonia (positive test 11/30)  Recommendations: --Continue vancomycin 1 g post HD --Continue cefepime 2 g post HD --Continue wound care for sacral decubitus --Anticipate this infection will be very difficult to eradicate without appropriate debridement and/or amputation --Plan for 6 weeks total with end date planned for 06/10/2020 --Follow-up appointment with me scheduled on 06/02/2020 at 4:15 PM --Will sign off, please call as needed   Raynelle Highland for Infectious [place] [phone] pager 05/05/2020, 9:48 AM

## 2020-05-05 NOTE — Progress Notes (Signed)
Physical Therapy Wound Treatment [name] MRN: [id number] Date of Birth: 1970/07/09  Today's Date: 05/05/2020 Time: 0712-1975 Time Calculation (min): 30 min  Subjective  Subjective: Pt asking, "is this going to maintain it?" Patient and Family Stated Goals: I really want to get healed up. Date of Onset:  (prior to admission) Prior Treatments: Dressing changes  Pain Score:  2/10  Wound Assessment  Pressure Injury 05/01/20 Buttocks Left Stage 4 - Full thickness tissue loss with exposed bone, tendon or muscle. packed with santyl gauze, dressed by WOCN, (Active)  Dressing Type ABD;Barrier Film (skin prep);Gauze (Comment) 05/05/20 1703  Dressing Changed;Clean;Dry;Intact 05/05/20 1703  Dressing Change Frequency Daily 05/05/20 1703  State of Healing Early/partial granulation 05/05/20 1703  Site / Wound Assessment Pale;Pink;Yellow 05/05/20 1703  % Wound base Red or Granulating 40% 05/04/20 1317  % Wound base Yellow/Fibrinous Exudate 60% 05/04/20 1317  % Wound base Black/Eschar 0% 05/04/20 1317  % Wound base Other/Granulation Tissue (Comment) 0% 05/04/20 1317  Peri-wound Assessment Intact;Pink 05/05/20 1703  Wound Length (cm) 6 cm 05/05/20 1000  Wound Width (cm) 6.5 cm 05/05/20 1000  Wound Depth (cm) 3 cm 05/05/20 1000  Wound Surface Area (cm^2) 39 cm^2 05/05/20 1000  Wound Volume (cm^3) 117 cm^3 05/05/20 1000  Undermining (cm) 4 cm 12*, 3 cm 9*, 3 cm 3* 05/01/20 1300  Margins Unattached edges (unapproximated) 05/05/20 1703  Drainage Amount Copious 05/05/20 1703  Drainage Description Sanguineous 05/05/20 1703  Treatment Hydrotherapy (Pulse lavage);Packing (Saline gauze) 05/05/20 1703   Santyl applied to wound bed prior to applying dressing.    Hydrotherapy Pulsed lavage therapy - wound location: L buttock wound Pulsed Lavage with Suction (psi): 12 psi Pulsed Lavage with Suction - Normal Saline Used: 1000 mL Pulsed Lavage Tip: Tip with splash shield    Wound Assessment and Plan  Wound Therapy - Assess/Plan/Recommendations Wound Therapy - Clinical Statement: Pt continues with increased serosanguineous drainage. Sharps debridement deferred this session and will reassess tomorrow with PA for readiness for resumption of debridement. Will continue to follow. Wound Therapy - Functional Problem List: decreased mobility Factors Delaying/Impairing Wound Healing: Diabetes Mellitus;Altered sensation;Immobility Hydrotherapy Plan: Debridement;Dressing change;Patient/family education;Pulsatile lavage with suction Wound Therapy - Frequency: 6X / week Wound Therapy - Current Recommendations: PT Wound Therapy - Follow Up Recommendations: Skilled nursing facility Wound Plan: see above  Wound Therapy Goals- Improve the function of patient's integumentary system by progressing the wound(s) through the phases of wound healing (inflammation - proliferation - remodeling) by: Decrease Necrotic Tissue to: 15 Decrease Necrotic Tissue - Progress: Progressing toward goal Increase Granulation Tissue to: 85% Increase Granulation Tissue - Progress: Progressing toward goal Goals/treatment plan/discharge plan were made with and agreed upon by patient/family: Yes Time For Goal Achievement: 7 days Wound Therapy - Potential for Goals: Good  Goals will be updated until maximal potential achieved or discharge criteria met.  Discharge criteria: when goals achieved, discharge from hospital, MD decision/surgical intervention, no progress towards goals, refusal/missing three consecutive treatments without notification or medical reason.  GP    Wyona Almas, PT, DPT Acute Rehabilitation Services Pager [phone] Office [phone]  Deno Etienne 05/05/2020, 5:12 PM

## 2020-05-05 NOTE — Progress Notes (Signed)
Inpatient Rehab Admissions Coordinator Note:   Per updated PT recommendations, pt was screened for CIR candidacy by Shann Medal, PT, DPT.  At this time we are recommending a CIR consult and I will request an order per our protocol.  Please contact me with questions.   Shann Medal, PT, DPT [phone] 05/05/20 10:41 AM

## 2020-05-05 NOTE — Progress Notes (Signed)
[facility] Progress Note   Subjective:  Seen on HD, no issues today. BP's up.    Objective Vitals:   05/05/20 1115 05/05/20 1145 05/05/20 1220 05/05/20 1310  BP: (!) 163/101 (!) 165/110 (!) 166/107   Pulse: 93  94   Resp:  13 16 15   Temp:   98.1 F (36.7 C) 98.6 F (37 C)  TempSrc:   Oral Oral  SpO2:      Weight:      Height:       Physical Exam  General:chroncially ill appearing male in NAD Heart:RRR, no mrg Lungs:mostly CTAB, BS decreased Abdomen:soft, NTND Extremities:trace edema b/l.  B/l feet/ankle wrapped  Dialysis Access: TDC     OP HD:  MWF (on COVID shift) , was TTS   4h  400/800  143.5kg  3K/2.5Ca  TDC  Hep 3000 Mircera142mcg q2wks - last 12/8 Venofer 100mg  qHD x10 - completed 6  Assessment/ Plan: 1. Bilat foot wounds+ osteo/ sacral decub osteo- per ID cont vanc/ maxipime, may need I&D and/or amputation per ID notes. 6 wks total IV abx thru 06/10/20 per ID.   2. COVID PNA- tested positive on 11/30.   Per admit.  3. ESRD- MWF HD now, return back to TTS when off isolation. HD today.  4. Hypertension/volume- BP's high, sig LBW loss. Down 10kg from prior dry wt. 1-2 L UF today.  5. Anemiaof CKD- Hgb 8.4. ESA just dosed 12/8. Hold iron d/t infection. 6. Secondary Hyperparathyroidism -CCa in goal.  Phos improved to 3.5 with supplementation. Not on VDRA or binders.  7. Nutrition- alb 1.5  Ok to use carb mod / w/fluid restrictions/ with low K, phos ,pro source and Nepro supplement  8. DM T 2 -  per admit     Kelly Splinter, MD 05/05/2020, 3:21 PM       Filed Weights   05/04/20 0413 05/05/20 0350  Weight: 133.8 kg (!) 136.5 kg    Intake/Output Summary (Last 24 hours) at 05/05/2020 1521 Last data filed at 05/05/2020 1220 Gross per 24 hour  Intake 240 ml  Output 1512 ml  Net -1272 ml    Additional Objective Labs: Basic Metabolic Panel: Recent Labs  Lab 05/02/20 0357 05/03/20 0140 05/04/20 0546 05/05/20 0058   NA 134* 131* 136 134*  K 3.8 4.2 3.2* 3.3*  CL 98 97* 99 99  CO2 24 23 27 27   GLUCOSE 196* 347* 225* 173*  BUN 16 28* 26* 34*  CREATININE 4.91* 5.95* 4.42* 4.99*  CALCIUM 7.6* 7.5* 7.7* 7.7*  PHOS 3.5 3.4 3.4  --    Liver Function Tests: Recent Labs  Lab 05/02/20 0357 05/03/20 0140 05/04/20 0546  AST 32 33 41  ALT 17 18 24   ALKPHOS 239* 326* 375*  BILITOT 0.9 0.6 0.8  PROT 5.9* 6.3* 6.0*  ALBUMIN 1.4* 1.5* 1.5*   CBC: Recent Labs  Lab 05/01/20 0342 05/02/20 0357 05/03/20 0140 05/04/20 0546 05/05/20 0058  WBC 8.0 6.7 9.6 8.1 8.0  NEUTROABS 6.2 5.9 8.1* 6.2  --   HGB 8.7* 8.4* 8.4* 8.2* 7.6*  HCT 28.0* 28.4* 27.9* 25.8* 23.6*  MCV 82.8 83.5 80.9 79.6* 80.0  PLT 218 270 263 286 211   Blood Culture    Component Value Date/Time   SDES BLOOD SITE NOT SPECIFIED 04/29/2020 1210   SPECREQUEST  04/29/2020 1210    BOTTLES DRAWN AEROBIC AND ANAEROBIC Blood Culture adequate volume   CULT  04/29/2020 1210    NO GROWTH 5  DAYS Performed at [facility], [place] [address] [place], [place] [address]    REPTSTATUS 05/04/2020 FINAL 04/29/2020 1210   CBG: Recent Labs  Lab 05/04/20 1223 05/04/20 1744 05/04/20 1954 05/05/20 0821 05/05/20 1308  GLUCAP 193* 176* 187* 189* 130*   Iron Studies:  Recent Labs    05/04/20 0546  FERRITIN 1,436*   Lab Results  Component Value Date   INR 1.2 04/29/2020   Studies/Results: No results found.  Medications: . ceFEPime (MAXIPIME) IV Stopped (05/04/20 2130)  . vancomycin 1,000 mg (05/05/20 1334)   . (feeding supplement) PROSource Plus  30 mL Oral BID BM  . amLODipine  10 mg Oral Daily  . Chlorhexidine Gluconate Cloth  6 each Topical Q0600  . collagenase   Topical BID  . famotidine  20 mg Oral BID  . feeding supplement (NEPRO CARB STEADY)  237 mL Oral BID BM  . heparin injection (subcutaneous)  5,000 Units Subcutaneous Q8H  . insulin aspart  0-5 Units Subcutaneous QHS  . insulin aspart  0-9 Units Subcutaneous TID WC   . insulin aspart  5 Units Subcutaneous TID WC  . lidocaine-EPINEPHrine  20 mL Infiltration Once  . metoCLOPramide  5 mg Oral TID AC  . multivitamin  1 tablet Oral QHS  . senna  1 tablet Oral BID  . sodium chloride flush  3 mL Intravenous Q12H

## 2020-05-06 LAB — BASIC METABOLIC PANEL
Anion gap: 10 (ref 5–15)
BUN: 22 mg/dL — ABNORMAL HIGH (ref 6–20)
CO2: 26 mmol/L (ref 22–32)
Calcium: 7.5 mg/dL — ABNORMAL LOW (ref 8.9–10.3)
Chloride: 100 mmol/L (ref 98–111)
Creatinine, Ser: 3.49 mg/dL — ABNORMAL HIGH (ref 0.61–1.24)
GFR, Estimated: 21 mL/min — ABNORMAL LOW (ref >60)
Glucose, Bld: 177 mg/dL — ABNORMAL HIGH (ref 70–99)
Potassium: 3.6 mmol/L (ref 3.5–5.1)
Sodium: 136 mmol/L (ref 135–145)

## 2020-05-06 LAB — GLUCOSE, CAPILLARY
Glucose-Capillary: 172 mg/dL — ABNORMAL HIGH (ref 70–99)
Glucose-Capillary: 160 mg/dL — ABNORMAL HIGH (ref 70–99)
Glucose-Capillary: 209 mg/dL — ABNORMAL HIGH (ref 70–99)
Glucose-Capillary: 128 mg/dL — ABNORMAL HIGH (ref 70–99)
Glucose-Capillary: 108 mg/dL — ABNORMAL HIGH (ref 70–99)

## 2020-05-06 LAB — CBC
HCT: 22.6 % — ABNORMAL LOW (ref 39.0–52.0)
Hemoglobin: 7.2 g/dL — ABNORMAL LOW (ref 13.0–17.0)
MCH: 25.9 pg — ABNORMAL LOW (ref 26.0–34.0)
MCHC: 31.9 g/dL (ref 30.0–36.0)
MCV: 81.3 fL (ref 80.0–100.0)
Platelets: 186 10*3/uL (ref 150–400)
RBC: 2.78 MIL/uL — ABNORMAL LOW (ref 4.22–5.81)
RDW: 18.3 % — ABNORMAL HIGH (ref 11.5–15.5)
WBC: 8 10*3/uL (ref 4.0–10.5)
nRBC: 0 % (ref 0.0–0.2)

## 2020-05-06 MED ORDER — METHOCARBAMOL 1000 MG/10ML IJ SOLN: 500.0000 mg | Freq: Three times a day (TID) | INTRAVENOUS | Status: DC

## 2020-05-06 MED ORDER — LACTATED RINGERS IV BOLUS
250.0000 mL | Freq: Once | INTRAVENOUS | Status: AC
  Administered 2020-05-06: 250 mL via INTRAVENOUS

## 2020-05-06 NOTE — Progress Notes (Signed)
Physical Therapy Wound Treatment [name] MRN: [id number] Date of Birth: 06/20/1970  Today's Date: 05/06/2020 Time: 3825-0539 Time Calculation (min): 35 min  Subjective  Subjective: Pt asking if we are done yet before treatment had even began. Patient and Family Stated Goals: I really want to get healed up. Date of Onset:  (prior to admission) Prior Treatments: Dressing changes  Pain Score:  No pain reported throughout session.   Wound Assessment  Pressure Injury 05/01/20 Buttocks Left Stage 4 - Full thickness tissue loss with exposed bone, tendon or muscle. packed with santyl gauze, dressed by WOCN, (Active)  Dressing Type ABD;Barrier Film (skin prep);Gauze (Comment);Moist to dry 05/06/20 1056  Dressing Changed;Clean;Dry;Intact 05/06/20 1056  Dressing Change Frequency Daily 05/06/20 1056  State of Healing Early/partial granulation 05/06/20 1056  Site / Wound Assessment Yellow;Pale;Pink 05/06/20 1056  % Wound base Red or Granulating 40% 05/06/20 1056  % Wound base Yellow/Fibrinous Exudate 60% 05/06/20 1056  % Wound base Black/Eschar 0% 05/06/20 1056  % Wound base Other/Granulation Tissue (Comment) 0% 05/06/20 1056  Peri-wound Assessment Intact;Pink 05/06/20 1056  Wound Length (cm) 6 cm 05/05/20 1000  Wound Width (cm) 6.5 cm 05/05/20 1000  Wound Depth (cm) 3 cm 05/05/20 1000  Wound Surface Area (cm^2) 39 cm^2 05/05/20 1000  Wound Volume (cm^3) 117 cm^3 05/05/20 1000  Undermining (cm) 4 cm 12*, 3 cm 9*, 3 cm 3* 05/01/20 1300  Margins Unattached edges (unapproximated) 05/06/20 1056  Drainage Amount Copious 05/06/20 0500  Drainage Description Serosanguineous 05/06/20 1056  Treatment Debridement (Selective);Hydrotherapy (Pulse lavage);Packing (Saline gauze) 05/06/20 1056   Santyl applied to wound bed prior to applying dressing.     Hydrotherapy Pulsed lavage therapy - wound location: L buttock wound Pulsed Lavage with Suction (psi): 12 psi Pulsed  Lavage with Suction - Normal Saline Used: 1000 mL Pulsed Lavage Tip: Tip with splash shield Selective Debridement Selective Debridement - Location: buttock wound Selective Debridement - Tools Used: Forceps;Scissors Selective Debridement - Tissue Removed: yellow unviable tissue   Wound Assessment and Plan  Wound Therapy - Assess/Plan/Recommendations Wound Therapy - Clinical Statement: Spoke with PA who reports she will plan to assess tomorrow, 12/17. No active bleeding noted with dressing removal and pulse lavage so conservative debridement performed without complication. Hydrotherapy will continue to follow for selective removal of unviable tissue, to decrease bioburden and promote wound bed healing. Wound Therapy - Functional Problem List: decreased mobility Factors Delaying/Impairing Wound Healing: Diabetes Mellitus;Altered sensation;Immobility Hydrotherapy Plan: Debridement;Dressing change;Patient/family education;Pulsatile lavage with suction Wound Therapy - Frequency: 6X / week Wound Therapy - Current Recommendations: PT Wound Therapy - Follow Up Recommendations: Skilled nursing facility Wound Plan: see above  Wound Therapy Goals- Improve the function of patient's integumentary system by progressing the wound(s) through the phases of wound healing (inflammation - proliferation - remodeling) by: Decrease Necrotic Tissue to: 15 Decrease Necrotic Tissue - Progress: Progressing toward goal Increase Granulation Tissue to: 85% Increase Granulation Tissue - Progress: Progressing toward goal Improve Drainage Characteristics: Min;Serous Goals/treatment plan/discharge plan were made with and agreed upon by patient/family: Yes Time For Goal Achievement: 7 days Wound Therapy - Potential for Goals: Good  Goals will be updated until maximal potential achieved or discharge criteria met.  Discharge criteria: when goals achieved, discharge from hospital, MD decision/surgical intervention, no  progress towards goals, refusal/missing three consecutive treatments without notification or medical reason.  GP     [name] 05/06/2020, 11:00 AM   [name], PT, DPT Acute Rehabilitation Services Pager:  [phone] Office: [phone]

## 2020-05-06 NOTE — Progress Notes (Signed)
HD#7 Subjective:  Overnight Events: None  Patient examined at bedside. No new complaints today. Had some pain with dressing change this morning. Discussed plan for CIR, which he is agreeable to.   Objective:  Vital signs in last 24 hours: Vitals:   05/05/20 1145 05/05/20 1220 05/05/20 1310 05/05/20 2100  BP: (!) 165/110 (!) 166/107 (!) 166/101 (!) 123/95  Pulse:  94    Resp: 13 16 15 18   Temp:  98.1 F (36.7 C) 98.6 F (37 C) 98.3 F (36.8 C)  TempSrc:  Oral Oral Oral  SpO2:   98% 100%  Weight:      Height:       Supplemental O2: Room air SpO2: 100 % O2 Flow Rate (L/min): 1 L/min   Physical Exam:  Physical Exam Constitutional:      Appearance: Normal appearance.  HENT:     Head: Normocephalic and atraumatic.  Cardiovascular:     Rate and Rhythm: Normal rate.  Abdominal:     Palpations: Abdomen is soft.     Tenderness: There is no abdominal tenderness.  Musculoskeletal:        General: Normal range of motion.     Cervical back: Normal range of motion.     Right lower leg: No edema.     Left lower leg: No edema.  Skin:    General: Skin is warm and dry.     Comments: Chronic wounds on sacrum and lower extremities covered with gauze and boots respectively  Neurological:     General: No focal deficit present.     Mental Status: He is alert. Mental status is at baseline.     Comments: Delayed responses, oriented to person and situation  Psychiatric:        Mood and Affect: Mood normal.    Filed Weights   05/05/20 0350  Weight: (!) 136.5 kg     Intake/Output Summary (Last 24 hours) at 05/06/2020 0645 Last data filed at 05/05/2020 2141 Gross per 24 hour  Intake 1440 ml  Output 1512 ml  Net -72 ml   Net IO Since Admission: -2,286.84 mL [05/06/20 0645]  Pertinent Labs: CBC Latest Ref Rng & Units 05/06/2020 05/05/2020 05/04/2020  WBC 4.0 - 10.5 K/uL 8.0 8.0 8.1  Hemoglobin 13.0 - 17.0 g/dL 7.2(L) 7.6(L) 8.2(L)  Hematocrit 39.0 - 52.0 % 22.6(L)  23.6(L) 25.8(L)  Platelets 150 - 400 K/uL 186 211 286    CMP Latest Ref Rng & Units 05/06/2020 05/05/2020 05/04/2020  Glucose 70 - 99 mg/dL 177(H) 173(H) 225(H)  BUN 6 - 20 mg/dL 22(H) 34(H) 26(H)  Creatinine 0.61 - 1.24 mg/dL 3.49(H) 4.99(H) 4.42(H)  Sodium 135 - 145 mmol/L 136 134(L) 136  Potassium 3.5 - 5.1 mmol/L 3.6 3.3(L) 3.2(L)  Chloride 98 - 111 mmol/L 100 99 99  CO2 22 - 32 mmol/L 26 27 27   Calcium 8.9 - 10.3 mg/dL 7.5(L) 7.7(L) 7.7(L)  Total Protein 6.5 - 8.1 g/dL - - 6.0(L)  Total Bilirubin 0.3 - 1.2 mg/dL - - 0.8  Alkaline Phos 38 - 126 U/L - - 375(H)  AST 15 - 41 U/L - - 41  ALT 0 - 44 U/L - - 24   Imaging: No results found.  Assessment/Plan:   Principal Problem:   Sacral osteomyelitis (HCC) Active Problems:   Insulin dependent type 2 diabetes mellitus (HCC)   HTN (hypertension)   History of anemia due to chronic kidney disease   Pressure injury of both heels, unstageable ([place])  Pneumonia due to COVID-19 virus   ESRD (end stage renal disease) ([place])   Avascular necrosis of first metatarsal, left foot (HCC)   Osteomyelitis of dorsal first metatarsal, left foot (HCC)   Decubitus ulcer of sacral region, unstageable ([place])   Morbid obesity [facility])  Patient Summary: 49 y/o male withhistory of HTN, DMon insulin, ESRD, [facility]who [facility] because [facility] staff stated that he wasnt eating the way he usually does with recent diagnosis of COVID on 03/24/2020 admitted for [place] with concern for sepsis from covid pneumonia, sacral, or foot wounds.   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  -Continue broad-spectrum antibioticst.   -offloading with padded boots. -PT/OT recommending CIR, awaiting evaluation for candidacy   COVID pneumonia: No new respiratory symptoms. Completed remdesivir x5 days  -Discussed with IP that patient can come off precautions if he is asymptomatic -Obtained record of positive test on 11/30, will discontinue airborne percautions -Ordered transfer for non COVID bed  ESRD on HD -Appreciate nephrology's assistance - Currently on TTS schedule may transition back to MWF when off COVID percautions  Diabetes Mellitus -NovoLog 5 units with meals -SSI   Diet: Carb/Renal VTE: Heparin Code: Full PT/OT recs: None ID: Day 6 of vancomycin, cefepime  Dispo: Anticipated discharge to CIR pending placement  Iona Beard 6:45 AM, 05/06/2020  Pager: [phone]  Please contact the on call pager after 5 pm and on weekends at [phone].

## 2020-05-06 NOTE — Progress Notes (Signed)
Renal Navigator obtained documentation of patient's COVID test results from 04/20/20 and have placed in paper chart. Patient will be out of isolation from HD standpoint on 05/12/20.  Alphonzo Cruise, [place] Renal Navigator [phone]

## 2020-05-06 NOTE — Progress Notes (Signed)
Physical Therapy Treatment Patient Details Name: Fred [name] MRN: [id number] DOB: [birth year] Today's Date: 05/06/2020    History of Present Illness Pt is a 48 y.o. male admitted from [facility] on 04/29/20 for evaluation of low oxygen saturations 82% on room air; concern for sepsis form Covid PNA, sacral wound and foot wounds. 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 Of note, pt recently admitted to [facility] due to gallbladder problems then d/c to [facility](14 days) for rehab then readmitted to [facility] then d/c to [facility] for rehab. PMH includes ESRD on HD (started 9/21), HTN, DM, C. diff.   PT Comments    Pt progressing with mobility this session. Tolerated prolonged sitting EOB activity and standing in Stedy frame with assist+2. Pt motivated to participate and be out of bed. Unfortunately, while standing in [place], pt with (+) orthostatic hypotension (see values below) and syncopal episode. Pt required totalA to return to supine and did not recall event once he regained consciousness (RN present and notified MD). Despite this event, pt remains motivated to participate and hopeful for additional attempt at [facility] tomorrow. Continue to recommend intensive CIR-level therapies to maximize functional mobility and independence.  Supine BP 137/87, HR 109 Sitting BP 119/88, HR up to 131 Standing BP 103/74 Return to supine (after syncope) BP 86/73 Supine w/ bed in Trendelenburg 114/77    Follow Up Recommendations  CIR     Equipment Recommendations  Wheelchair (measurements PT);Wheelchair cushion (measurements PT);Hospital bed (hoyer lift)    Recommendations for Other Services       Precautions / Restrictions Precautions Precautions: Fall Precaution Comments: Multiple wounds; (+) orthostatic hypotension with syncope 12/16 Required Braces or Orthoses: Other Brace Other Brace: B prevalon boots Restrictions Weight  Bearing Restrictions: No    Mobility  Bed Mobility Overal bed mobility: Needs Assistance Bed Mobility: Rolling;Sidelying to Sit;Sit to Supine Rolling: Min assist Sidelying to sit: Max assist;HOB elevated   Sit to supine: Total assist;+2 for physical assistance;+2 for safety/equipment   General bed mobility comments: Rolling onto R-side well with use of rail and minA, maxA for UE support to elevate trunk; pt with syncopal episode requiring totalA for return to supine  Transfers Overall transfer level: Needs assistance Equipment used: Ambulation equipment used Transfers: Sit to/from Stand Sit to Stand: Max assist;+2 physical assistance;+2 safety/equipment;From elevated surface         General transfer comment: Multiple standing trials with stedy frame; pt able to stand on third attempt from elevated bed height with maxA+2 to elevate trunk, improving initiation of forward weight translation and BLE extension with each trial; bilateral knee instability blocked by Charlaine Dalton; maxA for eccentric control onto Stedy seat with pt c/o dizziness and with syncopal episode in stedy frame  Ambulation/Gait                 Stairs             Wheelchair Mobility    Modified Rankin (Stroke Patients Only)       Balance Overall balance assessment: Needs assistance Sitting-balance support: No upper extremity supported;Bilateral upper extremity supported;Single extremity supported Sitting balance-Leahy Scale: Fair Sitting balance - Comments: Prolonged static sitting at EOB with and without UE support; stability improved with at least single UE support     Standing balance-Leahy Scale: Zero Standing balance comment: Reliant on stedy frame to keep bilateral knees from buckling and BUE support, as well as external assist  Cognition Arousal/Alertness: Awake/alert Behavior During Therapy: WFL for tasks assessed/performed;Flat affect Overall  Cognitive Status: No family/caregiver present to determine baseline cognitive functioning Area of Impairment: Following commands;Safety/judgement;Awareness;Problem solving;Attention                   Current Attention Level: Selective   Following Commands: Follows one step commands consistently Safety/Judgement: Decreased awareness of safety;Decreased awareness of deficits Awareness: Emergent Problem Solving: Slow processing;Requires verbal cues General Comments: Cognition much improved from prior session two days ago. Still with some flat affect, but responding to jokes appropriately.      Exercises      General Comments General comments (skin integrity, edema, etc.): (+) orthostatic hypotension and syncopal episode while standing with BP down to 86/73 (RN/MD aware), pt regaining consciousness after being out for ~15-sec propped up by PT and Stedy frame, totalA for return to supine and trendelenberg in bed - PT back up to 114/77, then 137/--      Pertinent Vitals/Pain Pain Assessment: Faces Faces Pain Scale: Hurts a little bit Pain Location: Generalized Pain Descriptors / Indicators: Discomfort Pain Intervention(s): Monitored during session;Repositioned    Home Living                      Prior Function            PT Goals (current goals can now be found in the care plan section) Progress towards PT goals: Progressing toward goals    Frequency    Min 3X/week      PT Plan Current plan remains appropriate    Co-evaluation PT/OT/SLP Co-Evaluation/Treatment: Yes Reason for Co-Treatment: Complexity of the patient's impairments (multi-system involvement);For patient/therapist safety;To address functional/ADL transfers PT goals addressed during session: Mobility/safety with mobility;Balance;Strengthening/ROM        AM-PAC PT "6 Clicks" Mobility   Outcome Measure  Help needed turning from your back to your side while in a flat bed without using  bedrails?: A Little Help needed moving from lying on your back to sitting on the side of a flat bed without using bedrails?: A Lot Help needed moving to and from a bed to a chair (including a wheelchair)?: Total Help needed standing up from a chair using your arms (e.g., wheelchair or bedside chair)?: A Lot Help needed to walk in hospital room?: Total Help needed climbing 3-5 steps with a railing? : Total 6 Click Score: 10    End of Session Equipment Utilized During Treatment: Gait belt Activity Tolerance: Patient tolerated treatment well;Treatment limited secondary to medical complications (Comment) (limited by orthostatic hypotension) Patient left: in bed;with call bell/phone within reach;with bed alarm set;with nursing/sitter in room Nurse Communication: Mobility status;Need for lift equipment;Other (comment) ((+) orthostatic hypotension, syncope; RN present for this) PT Visit Diagnosis: Muscle weakness (generalized) (M62.81);Difficulty in walking, not elsewhere classified (R26.2)     Time: 4580-9983 PT Time Calculation (min) (ACUTE ONLY): 39 min  Charges:  $Therapeutic Activity: 23-37 mins                     Mabeline Caras, PT, DPT Acute Rehabilitation Services  Pager [phone] Office [phone]  Fred [name] 05/06/2020, 5:10 PM

## 2020-05-06 NOTE — Progress Notes (Signed)
Patient to patient's room regarding orthostatic hypotension.  Patient try to stand with physical therapy and became orthostatic with loss of consciousness for seconds.  His blood pressure went down to the 99Y systolic.  Patient was not sure of prodromal symptoms leading up to his syncopal episode.  On evaluation at bedside, patient resting comfortably with normal blood pressure.  He denies any chest pain, shortness of breath, abdominal pain, dizziness, nausea, changes in vision.  Is alert and oriented and aware of the events that had transpired.  I will give him a 250 cc bolus of fluid and watch him closely.  No emergent need for further work-up at this time.  Lawerance Cruel, D.O.  Internal Medicine Resident, PGY-2 [facility]  Pager: [phone] 3:52 PM, 05/06/2020

## 2020-05-06 NOTE — Progress Notes (Addendum)
Syncopal episode while working with Physical therapists (approx 15 seconds). Positive for Orthostatic BP from sitting to standing (see documented VS and PT note). Assisted pt back to bed, BP rechecked and returned to [facility] CBG 209.  Noted pt looks very pale, HGB 7.2 today. Dr. Lisabeth Devoid notified.

## 2020-05-06 NOTE — TOC Progression Note (Signed)
Transition of Care [facility]) - Progression Note    [name] MRN: [id number] Date of Birth: December 15, 1970  Transition of Care [facility]) CM/SW [place], LCSW Phone Number: 05/06/2020, 2:18 PM  Clinical Narrative:    [facility] team continuing to follow for discharge needs.         Expected Discharge Plan and Services                                                 Social Determinants of Health (SDOH) Interventions    Readmission Risk Interventions No flowsheet data found.

## 2020-05-06 NOTE — Care Management Important Message (Signed)
Important Message  [name] MRN: [id number] Date of Birth: 1970-07-29   Medicare Important Message Given:  Yes - Important Message mailed due to current National Emergency   Verbal consent obtained due to current National Emergency  Relationship to patient: Self Contact Name: Floyed Masoud Call Date: 05/06/20  Time: 1113 Phone: [phone] Outcome: No Answer/Busy Important Message mailed to: Patient address on file    Delorse Lek 05/06/2020, 11:13 AM

## 2020-05-06 NOTE — Progress Notes (Signed)
Occupational Therapy Treatment Patient Details Name: Fred [name] MRN: [id number] DOB: [birth year] Today's Date: 05/06/2020    History of present illness Pt is a 49 y.o. male admitted from [facility] on 04/29/20 for evaluation of low oxygen saturations 82% on room air; concern for sepsis form Covid PNA, sacral wound and foot wounds. 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 Of note, pt recently admitted to [facility] due to gallbladder problems then d/c to [facility](14 days) for rehab then readmitted to [facility] then d/c to [facility] for rehab. PMH includes ESRD on HD (started 9/21), HTN, DM, C. diff.   OT comments  Pt seen for OT follow up session with focus on ADL mobility progression. Pt able to complete bed mobility at max A level. Once EOB, he was able to maintain sitting for ~10 mins at min guard level. Stedy then used to progress sit <> stand ability. Pt attempted sit <> stands x3 in stedy and was not successful until the 3rd stand. Pt able to stand and hold standing ~2 mins before sitting on stedy frame. He then reporting feelings of "falling" and had 15-20s syncopal episode. Pt was +orthostatics (see below). RN and MD notified. Pt required total A +2-3 to return to bed safely. VSS before OT left session. Updated d/c recs to CIR due to pts independent PLOF. Will continue to follow.   Supine BP 137/87, HR 109 Sitting BP 119/88, HR up to 131 Standing BP 103/74 Return to supine (after syncope) BP 86/73 Supine w/ bed in Trendelenburg 114/77   Follow Up Recommendations  CIR;Supervision/Assistance - 24 hour    Equipment Recommendations  3 in 1 bedside commode;Wheelchair (measurements OT);Wheelchair cushion (measurements OT);Hospital bed    Recommendations for Other Services      Precautions / Restrictions Precautions Precautions: Fall Precaution Comments: Multiple wounds; (+) orthostatic hypotension with syncope  12/16 Required Braces or Orthoses: Other Brace Other Brace: B prevalon boots Restrictions Weight Bearing Restrictions: No       Mobility Bed Mobility Overal bed mobility: Needs Assistance Bed Mobility: Rolling;Sidelying to Sit;Sit to Supine Rolling: Min assist Sidelying to sit: Max assist;HOB elevated   Sit to supine: Total assist;+2 for physical assistance;+2 for safety/equipment   General bed mobility comments: Rolling onto R-side well with use of rail and minA, maxA for UE support to elevate trunk; pt with syncopal episode requiring totalA for return to supine  Transfers Overall transfer level: Needs assistance Equipment used: Ambulation equipment used Transfers: Sit to/from Stand Sit to Stand: Max assist;+2 physical assistance;+2 safety/equipment;From elevated surface         General transfer comment: Multiple standing trials with stedy frame; pt able to stand on third attempt from elevated bed height with maxA+2 to elevate trunk, improving initiation of forward weight translation and BLE extension with each trial; bilateral knee instability blocked by Charlaine Dalton; maxA for eccentric control onto Stedy seat with pt c/o dizziness and with syncopal episode in stedy frame    Balance Overall balance assessment: Needs assistance Sitting-balance support: No upper extremity supported;Bilateral upper extremity supported;Single extremity supported Sitting balance-Leahy Scale: Fair Sitting balance - Comments: Prolonged static sitting at EOB with and without UE support; stability improved with at least single UE support   Standing balance support: Bilateral upper extremity supported;During functional activity Standing balance-Leahy Scale: Zero Standing balance comment: Reliant on stedy frame to keep bilateral knees from buckling and BUE support, as well as external assist  ADL either performed or assessed with clinical judgement   ADL Overall ADL's :  Needs assistance/impaired                     Lower Body Dressing: Maximal assistance;Bed level   Toilet Transfer: Maximal assistance;+2 for physical assistance;+2 for safety/equipment Toilet Transfer Details (indicate cue type and reason): in stedy frame         Functional mobility during ADLs: Maximal assistance;+2 for physical assistance;+2 for safety/equipment (sit <> stand in stedy) General ADL Comments: pt performed x3 trials of sit <> stand in stedy with syncopal episode with last sit <> stand.     Vision       Perception     Praxis      Cognition Arousal/Alertness: Awake/alert Behavior During Therapy: WFL for tasks assessed/performed;Flat affect Overall Cognitive Status: No family/caregiver present to determine baseline cognitive functioning Area of Impairment: Following commands;Safety/judgement;Awareness;Problem solving;Attention                   Current Attention Level: Selective   Following Commands: Follows one step commands consistently Safety/Judgement: Decreased awareness of safety;Decreased awareness of deficits Awareness: Emergent Problem Solving: Slow processing;Requires verbal cues General Comments: pt with flat affect and slow processing. Requires increased time and cues to process basic tasks        Exercises     Shoulder Instructions       General Comments (+) orthostatic hypotension and syncopal episode while standing with BP down to 86/73 (RN/MD aware), pt regaining consciousness after being out for ~15-sec propped up by PT and Stedy frame, totalA for return to supine and trendelenberg in bed - PT back up to 114/77, then 137/--    Pertinent Vitals/ Pain       Pain Assessment: Faces Faces Pain Scale: Hurts a little bit Pain Location: Generalized Pain Descriptors / Indicators: Discomfort Pain Intervention(s): Limited activity within patient's tolerance;Monitored during session  Home Living                                           Prior Functioning/Environment              Frequency  Min 3X/week        Progress Toward Goals  OT Goals(current goals can now be found in the care plan section)  Progress towards OT goals: Progressing toward goals  Acute Rehab OT Goals Patient Stated Goal: Heal wound; be able to walk OT Goal Formulation: Patient unable to participate in goal setting Time For Goal Achievement: 05/18/20 Potential to Achieve Goals: [place] Discharge plan needs to be updated;Frequency needs to be updated    Co-evaluation    PT/OT/SLP Co-Evaluation/Treatment: Yes Reason for Co-Treatment: For patient/therapist safety;To address functional/ADL transfers;Complexity of the patient's impairments (multi-system involvement) PT goals addressed during session: Mobility/safety with mobility;Balance;Strengthening/ROM OT goals addressed during session: ADL's and self-care;Proper use of Adaptive equipment and DME;Strengthening/ROM      AM-PAC OT "6 Clicks" Daily Activity     Outcome Measure   Help from another person eating meals?: A Little Help from another person taking care of personal grooming?: A Little Help from another person toileting, which includes using toliet, bedpan, or urinal?: Total Help from another person bathing (including washing, rinsing, drying)?: A Lot Help from another person to put on and taking off regular upper body clothing?: A Little Help  from another person to put on and taking off regular lower body clothing?: A Lot 6 Click Score: 14    End of Session Equipment Utilized During Treatment: Gait belt  OT Visit Diagnosis: Other abnormalities of gait and mobility (R26.89);Muscle weakness (generalized) (M62.81);Other symptoms and signs involving cognitive function;Dizziness and giddiness (R42)   Activity Tolerance Treatment limited secondary to medical complications (Comment) (syncope)   Patient Left in bed;with call bell/phone within  reach;with bed alarm set   Nurse Communication Mobility status;Need for lift equipment        Time: [phone] OT Time Calculation (min): 39 min  Charges: OT General Charges $OT Visit: 1 Visit OT Treatments $Self Care/Home Management : 8-22 mins  Zenovia Jarred, MSOT, OTR/L [facility] Office Number: [phone] Pager: [phone]  Zenovia Jarred 05/06/2020, 6:09 PM

## 2020-05-06 NOTE — Progress Notes (Signed)
[facility] Progress Note   Subjective:  Seen in room, many c/o's, nothing significant    Objective Vitals:   05/05/20 1220 05/05/20 1310 05/05/20 2100 05/06/20 1300  BP: (!) 166/107 (!) 166/101 (!) 123/95 121/90  Pulse: 94   99  Resp: 16 15 18 15   Temp: 98.1 F (36.7 C) 98.6 F (37 C) 98.3 F (36.8 C) 97.9 F (36.6 C)  TempSrc: Oral Oral Oral Oral  SpO2:  98% 100% 97%  Weight:      Height:       Physical Exam  General:chroncially ill appearing male in NAD Heart:RRR, no mrg Lungs:mostly CTAB, BS decreased Abdomen:soft, NTND Extremities:trace edema b/l.  B/l feet/ankle wrapped  Dialysis Access: TDC     OP HD:  MWF (on COVID shift) , was TTS   4h  400/800  143.5kg  3K/2.5Ca  TDC  Hep 3000 Mircera154mcg q2wks - last 12/8 Venofer 100mg  qHD x10 - completed 6  Assessment/ Plan: 1. Bilat foot wounds+ osteo/ sacral decub osteo- per ID cont vanc/ maxipime, may need I&D and/or amputation per ID notes. 6 wks total IV abx thru 06/10/20 per ID.   2. COVID PNA- tested positive on 11/30.   Per admit.  3. ESRD- MWF HD now, return back to TTS when off isolation. HD Friday 4. Hypertension/volume- BP's high, sig LBW loss. 6kg under dry, exam w/o vol excess 5. Anemiaof CKD- Hgb 8.4. ESA just dosed 12/8. Hold iron d/t infection. 6. Secondary Hyperparathyroidism -CCa in goal.  Phos improved to 3.5 with supplementation. Not on VDRA or binders.  7. Nutrition- alb 1.5  Ok to use carb mod / w/fluid restrictions/ with low K, phos ,pro source and Nepro supplement  8. DM T 2 -  per admit     Kelly Splinter, MD 05/06/2020, 2:58 PM       Filed Weights   05/05/20 0350  Weight: (!) 136.5 kg    Intake/Output Summary (Last 24 hours) at 05/06/2020 1458 Last data filed at 05/06/2020 1300 Gross per 24 hour  Intake 1480 ml  Output 100 ml  Net 1380 ml    Additional Objective Labs: Basic Metabolic Panel: Recent Labs  Lab 05/02/20 0357 05/03/20 0140  05/04/20 0546 05/05/20 0058 05/06/20 0327  NA 134* 131* 136 134* 136  K 3.8 4.2 3.2* 3.3* 3.6  CL 98 97* 99 99 100  CO2 24 23 27 27 26   GLUCOSE 196* 347* 225* 173* 177*  BUN 16 28* 26* 34* 22*  CREATININE 4.91* 5.95* 4.42* 4.99* 3.49*  CALCIUM 7.6* 7.5* 7.7* 7.7* 7.5*  PHOS 3.5 3.4 3.4  --   --    Liver Function Tests: Recent Labs  Lab 05/02/20 0357 05/03/20 0140 05/04/20 0546  AST 32 33 41  ALT 17 18 24   ALKPHOS 239* 326* 375*  BILITOT 0.9 0.6 0.8  PROT 5.9* 6.3* 6.0*  ALBUMIN 1.4* 1.5* 1.5*   CBC: Recent Labs  Lab 05/02/20 0357 05/03/20 0140 05/04/20 0546 05/05/20 0058 05/06/20 0327  WBC 6.7 9.6 8.1 8.0 8.0  NEUTROABS 5.9 8.1* 6.2  --   --   HGB 8.4* 8.4* 8.2* 7.6* 7.2*  HCT 28.4* 27.9* 25.8* 23.6* 22.6*  MCV 83.5 80.9 79.6* 80.0 81.3  PLT 270 263 286 211 186   Blood Culture    Component Value Date/Time   SDES BLOOD SITE NOT SPECIFIED 04/29/2020 1210   SPECREQUEST  04/29/2020 1210    BOTTLES DRAWN AEROBIC AND ANAEROBIC Blood Culture adequate volume  CULT  04/29/2020 1210    NO GROWTH 5 DAYS Performed at [facility], [place] [address] [place], [place] [address]    REPTSTATUS 05/04/2020 FINAL 04/29/2020 1210   CBG: Recent Labs  Lab 05/05/20 1308 05/05/20 1713 05/05/20 2103 05/06/20 0730 05/06/20 1143  GLUCAP 130* 291* 216* 172* 160*   Iron Studies:  Recent Labs    05/04/20 0546  FERRITIN 1,436*   Lab Results  Component Value Date   INR 1.2 04/29/2020   Studies/Results: No results found.  Medications: . ceFEPime (MAXIPIME) IV Stopped (05/05/20 2112)  . vancomycin Stopped (05/05/20 1435)   . (feeding supplement) PROSource Plus  30 mL Oral BID BM  . amLODipine  10 mg Oral Daily  . Chlorhexidine Gluconate Cloth  6 each Topical Q0600  . collagenase   Topical BID  . famotidine  20 mg Oral BID  . feeding supplement (NEPRO CARB STEADY)  237 mL Oral BID BM  . heparin injection (subcutaneous)  5,000 Units Subcutaneous Q8H  . insulin  aspart  0-5 Units Subcutaneous QHS  . insulin aspart  0-9 Units Subcutaneous TID WC  . insulin aspart  5 Units Subcutaneous TID WC  . lidocaine-EPINEPHrine  20 mL Infiltration Once  . metoCLOPramide  5 mg Oral TID AC  . multivitamin  1 tablet Oral QHS  . senna  1 tablet Oral BID  . sodium chloride flush  3 mL Intravenous Q12H

## 2020-05-07 LAB — RENAL FUNCTION PANEL
Albumin: 1.4 g/dL — ABNORMAL LOW (ref 3.5–5.0)
Anion gap: 9 (ref 5–15)
BUN: 34 mg/dL — ABNORMAL HIGH (ref 6–20)
CO2: 28 mmol/L (ref 22–32)
Calcium: 7.4 mg/dL — ABNORMAL LOW (ref 8.9–10.3)
Chloride: 99 mmol/L (ref 98–111)
Creatinine, Ser: 5.1 mg/dL — ABNORMAL HIGH (ref 0.61–1.24)
GFR, Estimated: 13 mL/min — ABNORMAL LOW (ref >60)
Glucose, Bld: 219 mg/dL — ABNORMAL HIGH (ref 70–99)
Phosphorus: 3.3 mg/dL (ref 2.5–4.6)
Potassium: 4 mmol/L (ref 3.5–5.1)
Sodium: 136 mmol/L (ref 135–145)

## 2020-05-07 LAB — GLUCOSE, CAPILLARY
Glucose-Capillary: 132 mg/dL — ABNORMAL HIGH (ref 70–99)
Glucose-Capillary: 120 mg/dL — ABNORMAL HIGH (ref 70–99)
Glucose-Capillary: 148 mg/dL — ABNORMAL HIGH (ref 70–99)

## 2020-05-07 LAB — PREPARE RBC (CROSSMATCH)

## 2020-05-07 LAB — CBC
HCT: 23.1 % — ABNORMAL LOW (ref 39.0–52.0)
Hemoglobin: 7 g/dL — ABNORMAL LOW (ref 13.0–17.0)
MCH: 25.5 pg — ABNORMAL LOW (ref 26.0–34.0)
MCHC: 30.3 g/dL (ref 30.0–36.0)
MCV: 84 fL (ref 80.0–100.0)
Platelets: 204 10*3/uL (ref 150–400)
RBC: 2.75 MIL/uL — ABNORMAL LOW (ref 4.22–5.81)
RDW: 18.8 % — ABNORMAL HIGH (ref 11.5–15.5)
WBC: 8.6 10*3/uL (ref 4.0–10.5)
nRBC: 0 % (ref 0.0–0.2)

## 2020-05-07 LAB — ABO/RH: ABO/RH(D): A POS

## 2020-05-07 MED ORDER — HEPARIN SODIUM (PORCINE) 1000 UNIT/ML DIALYSIS: 3000.0000 [IU] | Freq: Once | INTRAMUSCULAR | Status: AC

## 2020-05-07 MED ORDER — HEPARIN SODIUM (PORCINE) 1000 UNIT/ML IJ SOLN
INTRAMUSCULAR | Status: AC
  Administered 2020-05-07: 3000 [IU] via INTRAVENOUS_CENTRAL
  Filled 2020-05-07: qty 7

## 2020-05-07 MED ORDER — SODIUM CHLORIDE 0.9% IV SOLUTION: Freq: Once | INTRAVENOUS | Status: AC

## 2020-05-07 MED ORDER — HEPARIN SODIUM (PORCINE) 1000 UNIT/ML IJ SOLN
1000.0000 [IU] | INTRAMUSCULAR | Status: DC | PRN
  Administered 2020-05-07: 3800 [IU] via INTRAVENOUS
  Administered 2020-05-13: 1000 [IU] via INTRAVENOUS
  Filled 2020-05-07 (×4): qty 1

## 2020-05-07 NOTE — Progress Notes (Signed)
[facility] Progress Note   Subjective:   Patient not examined today directly given COVID-19 + status, utilizing data taken from chart +/- discussions w/ providers and staff.      Objective Vitals:   05/07/20 0500 05/07/20 0719 05/07/20 0850 05/07/20 1339  BP: (!) 148/77  (!) 144/82 138/79  Pulse: 100   (!) 104  Resp: 14 16  18   Temp: 98.1 F (36.7 C)   98.2 F (36.8 C)  TempSrc: Oral   Oral  SpO2: 96%   92%  Weight: (!) 141.1 kg     Height:       Physical Exam  Patient not examined today directly given COVID-19 + status, utilizing data taken from chart +/- discussions w/ providers and staff.      OP HD:  MWF (on COVID shift) , was TTS   4h  400/800  143.5kg  3K/2.5Ca  TDC  Hep 3000 Mircera157mcg q2wks - last 12/8 Venofer 100mg  qHD x10 - completed 6  Assessment/ Plan: 1. Bilat foot wounds+ osteo/ sacral decub osteo- per ID cont vanc/ maxipime, may need I&D and/or amputation per ID notes. 6 wks total IV abx thru 06/10/20 per ID.   2. COVID PNA- tested positive on 11/30.   Per admit.  3. ESRD- MWF HD now, return back to TTS when off isolation. HD Friday 4. Hypertension/volume- BP's high, sig LBW loss. Wt's up a bit, but still under dry. Exam yest w/o vol excess.  5. Anemiaof CKD- Hgb 8.4. ESA just dosed 12/8. Hold iron d/t infection. 6. Secondary Hyperparathyroidism -CCa in goal.  Phos improved to 3.5 with supplementation. Not on VDRA or binders.  7. Nutrition- alb 1.5  Ok to use carb mod / w/fluid restrictions/ with low K, phos ,pro source and Nepro supplement  8. DM T 2 -  per admit     Kelly Splinter, MD 05/07/2020, 3:30 PM       Filed Weights   05/05/20 0350 05/07/20 0500  Weight: (!) 136.5 kg (!) 141.1 kg    Intake/Output Summary (Last 24 hours) at 05/07/2020 1530 Last data filed at 05/07/2020 1340 Gross per 24 hour  Intake 846 ml  Output 176 ml  Net 670 ml    Additional Objective Labs: Basic Metabolic  Panel: Recent Labs  Lab 05/02/20 0357 05/03/20 0140 05/04/20 0546 05/05/20 0058 05/06/20 0327  NA 134* 131* 136 134* 136  K 3.8 4.2 3.2* 3.3* 3.6  CL 98 97* 99 99 100  CO2 24 23 27 27 26   GLUCOSE 196* 347* 225* 173* 177*  BUN 16 28* 26* 34* 22*  CREATININE 4.91* 5.95* 4.42* 4.99* 3.49*  CALCIUM 7.6* 7.5* 7.7* 7.7* 7.5*  PHOS 3.5 3.4 3.4  --   --    Liver Function Tests: Recent Labs  Lab 05/02/20 0357 05/03/20 0140 05/04/20 0546  AST 32 33 41  ALT 17 18 24   ALKPHOS 239* 326* 375*  BILITOT 0.9 0.6 0.8  PROT 5.9* 6.3* 6.0*  ALBUMIN 1.4* 1.5* 1.5*   CBC: Recent Labs  Lab 05/02/20 0357 05/03/20 0140 05/04/20 0546 05/05/20 0058 05/06/20 0327  WBC 6.7 9.6 8.1 8.0 8.0  NEUTROABS 5.9 8.1* 6.2  --   --   HGB 8.4* 8.4* 8.2* 7.6* 7.2*  HCT 28.4* 27.9* 25.8* 23.6* 22.6*  MCV 83.5 80.9 79.6* 80.0 81.3  PLT 270 263 286 211 186   Blood Culture    Component Value Date/Time   SDES BLOOD SITE NOT SPECIFIED 04/29/2020 1210  SPECREQUEST  04/29/2020 1210    BOTTLES DRAWN AEROBIC AND ANAEROBIC Blood Culture adequate volume   CULT  04/29/2020 1210    NO GROWTH 5 DAYS Performed at [facility], [place] [address] [place], [place] [address]    REPTSTATUS 05/04/2020 FINAL 04/29/2020 1210   CBG: Recent Labs  Lab 05/06/20 1520 05/06/20 1747 05/06/20 2043 05/07/20 0738 05/07/20 1215  GLUCAP 209* 128* 108* 132* 120*   Iron Studies:  No results for input(s): IRON, TIBC, TRANSFERRIN, FERRITIN in the last 72 hours. Lab Results  Component Value Date   INR 1.2 04/29/2020   Studies/Results: No results found.  Medications: . ceFEPime (MAXIPIME) IV 1 g (05/06/20 2121)  . vancomycin Stopped (05/05/20 1435)   . heparin sodium (porcine)      . (feeding supplement) PROSource Plus  30 mL Oral BID BM  . amLODipine  10 mg Oral Daily  . Chlorhexidine Gluconate Cloth  6 each Topical Q0600  . collagenase   Topical BID  . famotidine  20 mg Oral BID  . feeding supplement  (NEPRO CARB STEADY)  237 mL Oral BID BM  . [START ON 05/08/2020] heparin  3,000 Units Dialysis Once in dialysis  . heparin injection (subcutaneous)  5,000 Units Subcutaneous Q8H  . insulin aspart  0-5 Units Subcutaneous QHS  . insulin aspart  0-9 Units Subcutaneous TID WC  . insulin aspart  5 Units Subcutaneous TID WC  . lidocaine-EPINEPHrine  20 mL Infiltration Once  . metoCLOPramide  5 mg Oral TID AC  . multivitamin  1 tablet Oral QHS  . senna  1 tablet Oral BID  . sodium chloride flush  3 mL Intravenous Q12H

## 2020-05-07 NOTE — Progress Notes (Signed)
Physical Therapy Treatment Patient Details Name: Fred [name] MRN: [id number] DOB: 01-31-1971 Today's Date: 05/07/2020    History of Present Illness Pt is a 49 y.o. male admitted from [facility] on 04/29/20 for evaluation of low oxygen saturations 82% on room air; concern for sepsis form Covid PNA, sacral wound and foot wounds. 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 Of note, pt recently admitted to [facility] due to gallbladder problems then d/c to [facility](14 days) for rehab then readmitted to [facility] then d/c to [facility] for rehab. PMH includes ESRD on HD (started 9/21), HTN, DM, C. diff.   PT Comments    Pt progressing well with mobility. Based on syncopal episode while standing with Stedy during yesterday's session, opted for maximove lift transfer to recliner to improve pt's tolerance to upright; pt tolerated well and happy to be out of bed in the chair. C/o slight dizziness during transfer (see BP values below). Pt motivated to participate and demonstrates improving BLE and core control. Continue to recommend intensive CIR-level therapies to maximize functional mobility and independence, as well as decrease caregiver burden.  Orthostatic BPs Supine 129/102  Post-lift transfer 105/52  Seated in recliner, legs reclined 122/84     Follow Up Recommendations  CIR;Supervision for mobility/OOB     Equipment Recommendations  Wheelchair (measurements PT);Wheelchair cushion (measurements PT);Hospital bed;Other (comment) (hoyer lift)    Recommendations for Other Services       Precautions / Restrictions Precautions Precautions: Fall Precaution Comments: Multiple wounds; (+) orthostatic hypotension with syncope 12/16 Other Brace: B prevalon boots Restrictions Weight Bearing Restrictions: No    Mobility  Bed Mobility Overal bed mobility: Needs Assistance Bed Mobility: Rolling Rolling: Min assist;Mod  assist         General bed mobility comments: Rolling R/L for pericare and pad placement, initiating roll well with bedrail and minA, requiring modA to achieve full pelvic rotation  Transfers Overall transfer level: Needs assistance Equipment used: Ambulation equipment used             General transfer comment: Transfer to recliner with maximove lift, pt tolerated well; c/o slight dizziness with BP down to 105/52  Ambulation/Gait                 Stairs             Wheelchair Mobility    Modified Rankin (Stroke Patients Only)       Balance Overall balance assessment: Needs assistance Sitting-balance support: No upper extremity supported;Bilateral upper extremity supported;Single extremity supported Sitting balance-Leahy Scale: Fair Sitting balance - Comments: Can lean forward and perform lateral leans seated in recliner; stability improved with UE support on armrests                                    Cognition Arousal/Alertness: Awake/alert Behavior During Therapy: WFL for tasks assessed/performed;Flat affect Overall Cognitive Status: No family/caregiver present to determine baseline cognitive functioning Area of Impairment: Following commands;Safety/judgement;Awareness;Problem solving;Attention                   Current Attention Level: Selective   Following Commands: Follows one step commands consistently;Follows multi-step commands inconsistently Safety/Judgement: Decreased awareness of safety;Decreased awareness of deficits Awareness: Emergent Problem Solving: Slow processing;Requires verbal cues General Comments: pt with flat affect and slow processing. Requires increased time and cues to process basic tasks  Exercises      General Comments General comments (skin integrity, edema, etc.): Supine BP 129/102, post-maximove lift transer BP 105/52, seated rest with legs reclined BP 122/84      Pertinent Vitals/Pain  Pain Assessment: Faces Faces Pain Scale: Hurts a little bit Pain Location: Generalized Pain Descriptors / Indicators: Discomfort Pain Intervention(s): Monitored during session;Repositioned    Home Living                      Prior Function            PT Goals (current goals can now be found in the care plan section) Progress towards PT goals: Progressing toward goals    Frequency    Min 3X/week      PT Plan Current plan remains appropriate    Co-evaluation PT/OT/SLP Co-Evaluation/Treatment: Yes Reason for Co-Treatment: Complexity of the patient's impairments (multi-system involvement);For patient/therapist safety;To address functional/ADL transfers PT goals addressed during session: Mobility/safety with mobility;Balance;Proper use of DME        AM-PAC PT "6 Clicks" Mobility   Outcome Measure  Help needed turning from your back to your side while in a flat bed without using bedrails?: A Little Help needed moving from lying on your back to sitting on the side of a flat bed without using bedrails?: A Lot Help needed moving to and from a bed to a chair (including a wheelchair)?: Total Help needed standing up from a chair using your arms (e.g., wheelchair or bedside chair)?: Total Help needed to walk in hospital room?: Total Help needed climbing 3-5 steps with a railing? : Total 6 Click Score: 9    End of Session   Activity Tolerance: Patient tolerated treatment well Patient left: in chair;with call bell/phone within reach Nurse Communication: Mobility status;Need for lift equipment PT Visit Diagnosis: Muscle weakness (generalized) (M62.81);Difficulty in walking, not elsewhere classified (R26.2)     Time: 5176-1607 PT Time Calculation (min) (ACUTE ONLY): 33 min  Charges:  $Therapeutic Activity: 8-22 mins                     Mabeline Caras, PT, DPT Acute Rehabilitation Services  Pager [phone] Office [place] 05/07/2020, 4:19  PM

## 2020-05-07 NOTE — Progress Notes (Signed)
HD#8 Subjective:  Overnight Events: Orthostatic hypotension with PT yesterday. Given 240mL IV fluids  Patient seen this morning on rounds during hydrotherapy.  Patient still has pretty significant wound with pain on the sacrum.  Patient denies any other symptoms.  He denies any chest pain, shortness of breath, abdominal pain.  Patient did become orthostatic yesterday with standing.  He will likely need significant more PT to improve his strength and mobility.  Objective:  Vital signs in last 24 hours: Vitals:   05/06/20 1502 05/06/20 1521 05/06/20 2041 05/07/20 0500  BP: (!) 115/91 114/72 131/87 (!) 148/77  Pulse:   100 100  Resp: 16 18 17 14   Temp:   98.8 F (37.1 C) 98.1 F (36.7 C)  TempSrc:   Oral Oral  SpO2:   90% 96%  Weight:      Height:       Supplemental O2: Room air SpO2: 96 % O2 Flow Rate (L/min): 0 L/min   Physical Exam:  Physical Exam Constitutional:      Appearance: Normal appearance.  HENT:     Head: Normocephalic and atraumatic.  Cardiovascular:     Rate and Rhythm: Normal rate.  Abdominal:     Palpations: Abdomen is soft.     Tenderness: There is no abdominal tenderness.  Musculoskeletal:        General: Normal range of motion.     Cervical back: Normal range of motion.     Right lower leg: No edema.     Left lower leg: No edema.  Skin:    General: Skin is warm and dry.     Comments: Chronic wounds on sacrum and lower extremities.  Patient has significant sacral wound with sinus tracking and undermining.  The superficial wound is beginning to epithelialize and closed.  There is significant purulent drainage within the wound.  Difficult to assess healthy wound base.  Neurological:     General: No focal deficit present.     Mental Status: He is alert. Mental status is at baseline.     Comments: Delayed responses, oriented to person and situation  Psychiatric:        Mood and Affect: Mood normal.    Filed Weights   05/05/20 0350  Weight: (!)  136.5 kg     Intake/Output Summary (Last 24 hours) at 05/07/2020 0544 Last data filed at 05/07/2020 0527 Gross per 24 hour  Intake 850 ml  Output 276 ml  Net 574 ml   Net IO Since Admission: -1,712.84 mL [05/07/20 0544]  Pertinent Labs: CBC Latest Ref Rng & Units 05/06/2020 05/05/2020 05/04/2020  WBC 4.0 - 10.5 K/uL 8.0 8.0 8.1  Hemoglobin 13.0 - 17.0 g/dL 7.2(L) 7.6(L) 8.2(L)  Hematocrit 39.0 - 52.0 % 22.6(L) 23.6(L) 25.8(L)  Platelets 150 - 400 K/uL 186 211 286    CMP Latest Ref Rng & Units 05/06/2020 05/05/2020 05/04/2020  Glucose 70 - 99 mg/dL 177(H) 173(H) 225(H)  BUN 6 - 20 mg/dL 22(H) 34(H) 26(H)  Creatinine 0.61 - 1.24 mg/dL 3.49(H) 4.99(H) 4.42(H)  Sodium 135 - 145 mmol/L 136 134(L) 136  Potassium 3.5 - 5.1 mmol/L 3.6 3.3(L) 3.2(L)  Chloride 98 - 111 mmol/L 100 99 99  CO2 22 - 32 mmol/L 26 27 27   Calcium 8.9 - 10.3 mg/dL 7.5(L) 7.7(L) 7.7(L)  Total Protein 6.5 - 8.1 g/dL - - 6.0(L)  Total Bilirubin 0.3 - 1.2 mg/dL - - 0.8  Alkaline Phos 38 - 126 U/L - - 375(H)  AST  15 - 41 U/L - - 41  ALT 0 - 44 U/L - - 24   Imaging: No results found.  Assessment/Plan:   Principal Problem:   Sacral osteomyelitis (HCC) Active Problems:   Insulin dependent type 2 diabetes mellitus (HCC)   HTN (hypertension)   History of anemia due to chronic kidney disease   Pressure injury of both heels, unstageable (HCC)   Pneumonia due to COVID-19 virus   ESRD (end stage renal disease) (HCC)   Avascular necrosis of first metatarsal, left foot (HCC)   Osteomyelitis of dorsal first metatarsal, left foot (HCC)   Decubitus ulcer of sacral region, unstageable ([place])   Morbid obesity ([place])  Patient Summary: 49 y/o male withhistory of HTN, DMon insulin, ESRD, Anemiawho [facility] because the staff stated that he wasnt eating the way he usually does with recent diagnosis of [facility] on 03/24/2020 admitted for [place] with concern for sepsis from covid pneumonia,  sacral, or foot wounds.   Sacral decubitus ulcer, unstageable Coccygeal osteomyelitis Patient's wound still looks minimally improved with significant undermining and sinus tracking and purulent drainage. - on day 7 of vancomycin and cefepime will continue this for 6 weeks.  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   -offloading with padded boots. -PT/OT recommending CIR, awaiting evaluation for candidacy   COVID-19: Patient likely had incidental positive Covid test without true symptoms.  He was positive on 11/30.  Therefore, he can be removed from airborne precautions and transferred out of the Covid unit.  I spoke with infection prevention regarding this and they agreed  ESRD on HD -Appreciate nephrology's assistance -Currently on TTS schedule may transition back to MWF when off COVID percautions  Diabetes Mellitus -NovoLog 5 units with meals -SSI   Diet: Carb/Renal VTE: Heparin Code: Full PT/OT recs: None ID: Day 6 of vancomycin, cefepime  Dispo: Anticipated discharge to CIR pending placement  Lawerance Cruel, D.O.  Internal Medicine Resident, PGY-2 [facility]  Pager: [phone] 12:55 PM, 05/07/2020       Please contact the on call pager after 5 pm and on weekends at [phone].

## 2020-05-07 NOTE — Progress Notes (Signed)
Inpatient Rehab Admissions Coordinator:   I will not have a bed for this patient until next week.  I will follow up on Monday.   Shann Medal, PT, DPT Admissions Coordinator [phone] 05/07/20  5:31 PM

## 2020-05-07 NOTE — Progress Notes (Signed)
Physical Therapy Wound Treatment [name] MRN: [id number] Date of Birth: 01/07/1971  Today's Date: 05/07/2020 Time: 1045-1110 Time Calculation (min): 25 min  Subjective  Subjective: Pt asking when he can see his family Patient and Family Stated Goals: I really want to get healed up. Date of Onset:  (prior to admission) Prior Treatments: Dressing changes  Pain Score:  No pain reported throughout treatment.   Wound Assessment  Pressure Injury 05/01/20 Buttocks Left Stage 4 - Full thickness tissue loss with exposed bone, tendon or muscle. packed with santyl gauze, dressed by WOCN, (Active)  Dressing Type ABD;Barrier Film (skin prep);Gauze (Comment) 05/07/20 1447  Dressing Changed;Clean;Dry;Intact 05/07/20 1447  Dressing Change Frequency Daily 05/07/20 1447  State of Healing Early/partial granulation 05/07/20 1447  Site / Wound Assessment Pink;Yellow 05/07/20 1447  % Wound base Red or Granulating 45% 05/07/20 1447  % Wound base Yellow/Fibrinous Exudate 55% 05/07/20 1447  % Wound base Black/Eschar 0% 05/07/20 1447  % Wound base Other/Granulation Tissue (Comment) 0% 05/07/20 1447  Peri-wound Assessment Intact;Pink 05/07/20 1447  Wound Length (cm) 6 cm 05/05/20 1000  Wound Width (cm) 6.5 cm 05/05/20 1000  Wound Depth (cm) 3 cm 05/05/20 1000  Wound Surface Area (cm^2) 39 cm^2 05/05/20 1000  Wound Volume (cm^3) 117 cm^3 05/05/20 1000  Undermining (cm) 4 cm 12*, 3 cm 9*, 3 cm 3* 05/01/20 1300  Margins Unattached edges (unapproximated) 05/07/20 1447  Drainage Amount Moderate 05/07/20 1447  Drainage Description Serosanguineous 05/07/20 1447  Treatment Debridement (Selective);Hydrotherapy (Pulse lavage);Packing (Saline gauze) 05/07/20 1447   Santyl applied to wound bed prior to applying dressing.     Hydrotherapy Pulsed lavage therapy - wound location: L buttock wound Pulsed Lavage with Suction (psi): 12 psi Pulsed Lavage with Suction - Normal Saline Used:  1000 mL Pulsed Lavage Tip: Tip with splash shield Selective Debridement Selective Debridement - Location: buttock wound Selective Debridement - Tools Used: Forceps;Scissors Selective Debridement - Tissue Removed: yellow unviable tissue   Wound Assessment and Plan  Wound Therapy - Assess/Plan/Recommendations Wound Therapy - Clinical Statement: Conservative debridement continued this session with some active bleeding noted after pulse lavage. Wound bed appearance imrpoving. Hydrotherapy will continue to follow for selective removal of unviable tissue, to decrease bioburden and promote wound bed healing. Wound Therapy - Functional Problem List: decreased mobility Factors Delaying/Impairing Wound Healing: Diabetes Mellitus;Altered sensation;Immobility Hydrotherapy Plan: Debridement;Dressing change;Patient/family education;Pulsatile lavage with suction Wound Therapy - Frequency: 6X / week Wound Therapy - Current Recommendations: PT Wound Therapy - Follow Up Recommendations: Skilled nursing facility Wound Plan: see above  Wound Therapy Goals- Improve the function of patient's integumentary system by progressing the wound(s) through the phases of wound healing (inflammation - proliferation - remodeling) by: Decrease Necrotic Tissue to: 15 Decrease Necrotic Tissue - Progress: Progressing toward goal Increase Granulation Tissue to: 85% Increase Granulation Tissue - Progress: Progressing toward goal Improve Drainage Characteristics: Min;Serous Goals/treatment plan/discharge plan were made with and agreed upon by patient/family: Yes Time For Goal Achievement: 7 days Wound Therapy - Potential for Goals: Good  Goals will be updated until maximal potential achieved or discharge criteria met.  Discharge criteria: when goals achieved, discharge from hospital, MD decision/surgical intervention, no progress towards goals, refusal/missing three consecutive treatments without notification or medical  reason.  GP      D  05/07/2020, 2:53 PM    , PT, DPT Acute Rehabilitation Services Pager: [phone] Office: [phone]    

## 2020-05-07 NOTE — Progress Notes (Signed)
    CC:  Poor appetite/sacral wound  Subjective: Patient seen during hydrotherapy. +BM today.  Objective: Vital signs in last 24 hours: Temp:  [97.9 F (36.6 C)-98.8 F (37.1 C)] 98.1 F (36.7 C) (12/17 0500) Pulse Rate:  [99-100] 100 (12/17 0500) Resp:  [14-18] 16 (12/17 0719) BP: (114-148)/(72-91) 144/82 (12/17 0850) SpO2:  [90 %-97 %] 96 % (12/17 0500) Weight:  [141.1 kg] 141.1 kg (12/17 0500) Last BM Date: 05/06/20 100 p.o. recorded 2000 out on dialysis BM x1 No other intake or output recorded Afebrile, hypertensive blood pressure 148/93, or higher Glucose 347, creatinine 5.95, WBC 9.6 05/03/2020 H/H 8.4/27.9 Intake/Output from previous day: 12/16 0701 - 12/17 0700 In: 1090 [P.O.:840; IV Piggyback:250] Out: 276 [Urine:275; Stool:1] Intake/Output this shift: No intake/output data recorded.  General appearance: alert, cooperative and no distress Skin: Skin color, texture, turgor normal. No rashes or lesions  Decubitus wound: ~8cm in diameter with 6-8 cm tunneling cephalad from the 9 to 12 o'clock position. Wound base <25% granulation tissue, mostly fibrinous exudate. No bleeding. periwound without cellulitis or necrosis.  Lab Results:  Recent Labs    05/05/20 0058 05/06/20 0327  WBC 8.0 8.0  HGB 7.6* 7.2*  HCT 23.6* 22.6*  PLT 211 186    BMET Recent Labs    05/05/20 0058 05/06/20 0327  NA 134* 136  K 3.3* 3.6  CL 99 100  CO2 27 26  GLUCOSE 173* 177*  BUN 34* 22*  CREATININE 4.99* 3.49*  CALCIUM 7.7* 7.5*   PT/INR No results for input(s): LABPROT, INR in the last 72 hours.  Recent Labs  Lab 05/01/20 0342 05/02/20 0357 05/03/20 0140 05/04/20 0546  AST 40 32 33 41  ALT 18 17 18 24   ALKPHOS 213* 239* 326* 375*  BILITOT 0.6 0.9 0.6 0.8  PROT 6.0* 5.9* 6.3* 6.0*  ALBUMIN 1.5* 1.4* 1.5* 1.5*     Lipase  No results found for: LIPASE   Medications: . (feeding supplement) PROSource Plus  30 mL Oral BID BM  . amLODipine  10 mg Oral Daily   . Chlorhexidine Gluconate Cloth  6 each Topical Q0600  . collagenase   Topical BID  . famotidine  20 mg Oral BID  . feeding supplement (NEPRO CARB STEADY)  237 mL Oral BID BM  . heparin injection (subcutaneous)  5,000 Units Subcutaneous Q8H  . insulin aspart  0-5 Units Subcutaneous QHS  . insulin aspart  0-9 Units Subcutaneous TID WC  . insulin aspart  5 Units Subcutaneous TID WC  . lidocaine-EPINEPHrine  20 mL Infiltration Once  . metoCLOPramide  5 mg Oral TID AC  . multivitamin  1 tablet Oral QHS  . senna  1 tablet Oral BID  . sodium chloride flush  3 mL Intravenous Q12H   . ceFEPime (MAXIPIME) IV 1 g (05/06/20 2121)  . vancomycin Stopped (05/05/20 1435)    Assessment/Plan Covid + 04/20/2020 with bilateral pneumonia - now off airborne precautions  End-stage renal disease on hemodialysis Insulin-dependent diabetes Hypertension Anemia  Sacral wound  -Resides [facility]  -Coccygeal osteomyelitis   FEN: Carb modified ID: Maxipime 12/9>>, vancomycin 12/10 >> (plan for total of 6 weeks per ID) DVT: LMWH Follow-up: TBD  Plan: No acute surgical needs. improving with hydrotherapy. Continue daily hydroPT and sharp debridement per hydroPT. General surgery will sign off. Call as needed.     LOS: 8 days    Jill Alexanders 05/07/2020 Please see Amion

## 2020-05-07 NOTE — Progress Notes (Signed)
Occupational Therapy Treatment Patient Details Name: Fred [name] MRN: [id number] DOB: 08-06-1970 Today's Date: 05/07/2020    History of present illness Pt is a 49 y.o. male admitted from [facility] on 04/29/20 for evaluation of low oxygen saturations 82% on room air; concern for sepsis form Covid PNA, sacral wound and foot wounds. 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 Of note, pt recently admitted to [facility] due to gallbladder problems then d/c to [facility](14 days) for rehab then readmitted to [facility] then d/c to [facility] for rehab. PMH includes ESRD on HD (started 9/21), HTN, DM, C. diff.   OT comments  Pt seen for follow up OT session with focus of using maximove to lift OOB to chair. Pt able to roll with min-mod A for peri hygiene and lift pad placement. Pt was maximoved to chair with slight c/o dizziness (see BP listed below) but improved from previous sessions. Educated pt on importance of increasing OOB time for improved orthostatic hypotension and activity tolerance. D/c recs remain appropriate, will continue to follow.  Orthostatic BPs Supine 129/102  Post-lift transfer 105/52  Seated in recliner, legs reclined 122/84     Follow Up Recommendations  CIR;Supervision/Assistance - 24 hour    Equipment Recommendations  3 in 1 bedside commode;Wheelchair (measurements OT);Wheelchair cushion (measurements OT);Hospital bed    Recommendations for Other Services      Precautions / Restrictions Precautions Precautions: Fall Precaution Comments: Multiple wounds; (+) orthostatic hypotension with syncope 12/16 Required Braces or Orthoses: Other Brace Other Brace: B prevalon boots Restrictions Weight Bearing Restrictions: No       Mobility Bed Mobility Overal bed mobility: Needs Assistance Bed Mobility: Rolling Rolling: Min assist;Mod assist         General bed mobility comments: Rolling R/L  for pericare and pad placement, initiating roll well with bedrail and minA, requiring modA to achieve full pelvic rotation  Transfers Overall transfer level: Needs assistance Equipment used: Ambulation equipment used             General transfer comment: Transfer to recliner with maximove lift, pt tolerated well; c/o slight dizziness with BP down to 105/52    Balance Overall balance assessment: Needs assistance Sitting-balance support: No upper extremity supported;Bilateral upper extremity supported;Single extremity supported Sitting balance-Leahy Scale: Fair Sitting balance - Comments: Can lean forward and perform lateral leans seated in recliner; stability improved with UE support on armrests                                   ADL either performed or assessed with clinical judgement   ADL Overall ADL's : Needs assistance/impaired Eating/Feeding: Set up;Bed level Eating/Feeding Details (indicate cue type and reason): able to feed self lunch from chair position in bed as OT arrived                                   General ADL Comments: session focused on using maximove to lift OOB for improved activity tolerance     Vision Patient Visual Report: No change from baseline     Perception     Praxis      Cognition Arousal/Alertness: Awake/alert Behavior During Therapy: WFL for tasks assessed/performed;Flat affect Overall Cognitive Status: No family/caregiver present to determine baseline cognitive functioning Area of Impairment: Following commands;Safety/judgement;Awareness;Problem solving;Attention  Current Attention Level: Selective   Following Commands: Follows one step commands consistently;Follows multi-step commands inconsistently Safety/Judgement: Decreased awareness of safety;Decreased awareness of deficits Awareness: Emergent Problem Solving: Slow processing;Requires verbal cues General Comments: pt with flat  affect and slow processing. Requires increased time and cues to process basic tasks        Exercises     Shoulder Instructions       General Comments Supine BP 129/102, post-maximove lift transer BP 105/52, seated rest with legs reclined BP 122/84    Pertinent Vitals/ Pain       Pain Assessment: Faces Faces Pain Scale: Hurts a little bit Pain Location: Generalized Pain Descriptors / Indicators: Discomfort Pain Intervention(s): Limited activity within patient's tolerance;Monitored during session  Home Living                                          Prior Functioning/Environment              Frequency  Min 3X/week        Progress Toward Goals  OT Goals(current goals can now be found in the care plan section)  Progress towards OT goals: Progressing toward goals  Acute Rehab OT Goals Patient Stated Goal: Heal wounds; be able to walk OT Goal Formulation: With patient Time For Goal Achievement: 05/18/20 Potential to Achieve Goals: Good  Plan Discharge plan remains appropriate    Co-evaluation    PT/OT/SLP Co-Evaluation/Treatment: Yes Reason for Co-Treatment: For patient/therapist safety;To address functional/ADL transfers PT goals addressed during session: Mobility/safety with mobility;Balance;Proper use of DME OT goals addressed during session: ADL's and self-care;Proper use of Adaptive equipment and DME;Strengthening/ROM      AM-PAC OT "6 Clicks" Daily Activity     Outcome Measure   Help from another person eating meals?: A Little Help from another person taking care of personal grooming?: A Little Help from another person toileting, which includes using toliet, bedpan, or urinal?: Total Help from another person bathing (including washing, rinsing, drying)?: A Lot Help from another person to put on and taking off regular upper body clothing?: A Little Help from another person to put on and taking off regular lower body clothing?: A Lot 6  Click Score: 14    End of Session    OT Visit Diagnosis: Other abnormalities of gait and mobility (R26.89);Muscle weakness (generalized) (M62.81);Other symptoms and signs involving cognitive function;Dizziness and giddiness (R42)   Activity Tolerance Patient tolerated treatment well   Patient Left in chair;with call bell/phone within reach   Nurse Communication Mobility status;Need for lift equipment        Time: 4235-3614 OT Time Calculation (min): 33 min  Charges: OT General Charges $OT Visit: 1 Visit OT Treatments $Therapeutic Activity: 8-22 mins  Zenovia Jarred, MSOT, OTR/L Acute Rehabilitation Services [facility] Office Number: [phone] Pager: [phone]  Zenovia Jarred 05/07/2020, 5:21 PM

## 2020-05-07 NOTE — Progress Notes (Signed)
Pharmacy Antibiotic Note- follow up   Fred [name] is a 49 y.o. male admitted on 04/29/2020 with sepsis.  Pharmacy was consulted on 04/29/20 for vancomycin and cefepime dosing. Vancomycin and cefepime continue for  sacral decubitus ulcer unstageable,  Coccygeal osteomyelitis.  -recent diagnosis of COVID on 04/20/20.  Completed remdesivir x5 days  -MD noted wound still looks minimally improved with significant undermining and sinus tracking and purulent drainage. ID plans to continue vancomycin/cefepime x 6 weeks, end date 06/11/19  Afebrile, WBC wnl .  Hx ESRD-HD, last HD done 12/15,  Nephrologist noted MWF HD for now, return back to TTS when off isolation. HDplanned Friday 12/17.   Plan: Continue Cefepime 1g IV q24h Vancomycin 1000 mg IV qHD-MWF (target pre-HD vancomycin level 15-25) --Monitor HD schedule, currently HD qMWF. HDplanned for Friday 12/17. -Vancomycin level as needed , I have ordered Vancomyhcin random level pre HD on Monday 12/20.  - ID plans to treat with Vanc/Cefepime with HD through 06/10/20.   Height: 6\' 6"  (198.1 cm) Weight: (!) 141.1 kg (311 lb) IBW/kg (Calculated) : 91.4  Temp (24hrs), Avg:98.4 F (36.9 C), Min:98.1 F (36.7 C), Max:98.8 F (37.1 C)  Recent Labs  Lab 05/02/20 0357 05/03/20 0140 05/04/20 0546 05/05/20 0058 05/06/20 0327  WBC 6.7 9.6 8.1 8.0 8.0  CREATININE 4.91* 5.95* 4.42* 4.99* 3.49*    Estimated Creatinine Clearance: 40.3 mL/min (A) (by C-G formula based on SCr of 3.49 mg/dL (H)).    Allergies  Allergen Reactions  . Atorvastatin Hives    NOT on [facility]    Nicole Cella, [place] Pharmacist Phone # [phone] 05/07/2020 2:55 PM

## 2020-05-08 DIAGNOSIS — Z7401 Bed confinement status: Secondary | ICD-10-CM

## 2020-05-08 LAB — CBC
HCT: 28.1 % — ABNORMAL LOW (ref 39.0–52.0)
Hemoglobin: 8.5 g/dL — ABNORMAL LOW (ref 13.0–17.0)
MCH: 25.7 pg — ABNORMAL LOW (ref 26.0–34.0)
MCHC: 30.2 g/dL (ref 30.0–36.0)
MCV: 84.9 fL (ref 80.0–100.0)
Platelets: 174 10*3/uL (ref 150–400)
RBC: 3.31 MIL/uL — ABNORMAL LOW (ref 4.22–5.81)
RDW: 18.5 % — ABNORMAL HIGH (ref 11.5–15.5)
WBC: 9.3 10*3/uL (ref 4.0–10.5)
nRBC: 0 % (ref 0.0–0.2)

## 2020-05-08 LAB — COMPREHENSIVE METABOLIC PANEL
ALT: 18 U/L (ref 0–44)
AST: 18 U/L (ref 15–41)
Albumin: 1.5 g/dL — ABNORMAL LOW (ref 3.5–5.0)
Alkaline Phosphatase: 274 U/L — ABNORMAL HIGH (ref 38–126)
Anion gap: 9 (ref 5–15)
BUN: 18 mg/dL (ref 6–20)
CO2: 27 mmol/L (ref 22–32)
Calcium: 7.8 mg/dL — ABNORMAL LOW (ref 8.9–10.3)
Chloride: 101 mmol/L (ref 98–111)
Creatinine, Ser: 3.42 mg/dL — ABNORMAL HIGH (ref 0.61–1.24)
GFR, Estimated: 21 mL/min — ABNORMAL LOW (ref >60)
Glucose, Bld: 167 mg/dL — ABNORMAL HIGH (ref 70–99)
Potassium: 4 mmol/L (ref 3.5–5.1)
Sodium: 137 mmol/L (ref 135–145)
Total Bilirubin: 0.6 mg/dL (ref 0.3–1.2)
Total Protein: 5.6 g/dL — ABNORMAL LOW (ref 6.5–8.1)

## 2020-05-08 LAB — TYPE AND SCREEN
ABO/RH(D): A POS
Antibody Screen: NEGATIVE
Unit division: 0

## 2020-05-08 LAB — GLUCOSE, CAPILLARY
Glucose-Capillary: 166 mg/dL — ABNORMAL HIGH (ref 70–99)
Glucose-Capillary: 140 mg/dL — ABNORMAL HIGH (ref 70–99)
Glucose-Capillary: 187 mg/dL — ABNORMAL HIGH (ref 70–99)
Glucose-Capillary: 165 mg/dL — ABNORMAL HIGH (ref 70–99)

## 2020-05-08 LAB — BPAM RBC
Blood Product Expiration Date: 202201132359
ISSUE DATE / TIME: 202112171842
Unit Type and Rh: 6200

## 2020-05-08 MED ORDER — FAMOTIDINE 20 MG PO TABS
20.0000 mg | ORAL_TABLET | Freq: Every day | ORAL | Status: DC
  Administered 2020-05-09 – 2020-06-05 (×27): 20 mg via ORAL
  Filled 2020-05-08 (×28): qty 1

## 2020-05-08 NOTE — Progress Notes (Signed)
HD#9 Subjective:  Overnight Events: none  Patient denies any new complaints today.  Patient resting in bed talking with sister.  Objective:  Vital signs in last 24 hours: Vitals:   05/07/20 1929 05/07/20 2205 05/08/20 0352 05/08/20 0534  BP: 138/84 (!) 144/90  (!) 144/85  Pulse:  95  (!) 102  Resp: 19 17  14   Temp: 98.5 F (36.9 C) 99.8 F (37.7 C)  98.3 F (36.8 C)  TempSrc: Oral Oral  Oral  SpO2:  94%  97%  Weight:   (!) 154.2 kg   Height:       Supplemental O2: Room Air SpO2: 97 % O2 Flow Rate (L/min):    Physical Exam:  Physical Exam Constitutional:      Appearance: Normal appearance.  HENT:     Head: Normocephalic and atraumatic.  Eyes:     Extraocular Movements: Extraocular movements intact.  Cardiovascular:     Rate and Rhythm: Normal rate.     Pulses: Normal pulses.     Heart sounds: Normal heart sounds.  Pulmonary:     Effort: Pulmonary effort is normal.     Breath sounds: Normal breath sounds.  Abdominal:     General: Bowel sounds are normal.     Palpations: Abdomen is soft.     Tenderness: There is no abdominal tenderness.  Musculoskeletal:        General: Normal range of motion.     Cervical back: Normal range of motion.     Right lower leg: No edema.     Left lower leg: No edema.  Skin:    General: Skin is warm and dry.     Comments: Patient has chronic wounds on his sacrum and his lower extremities.  Neurological:     Mental Status: He is alert and oriented to person, place, and time. Mental status is at baseline.  Psychiatric:        Mood and Affect: Mood normal.     Filed Weights   05/07/20 0500 05/08/20 0352  Weight: (!) 141.1 kg (!) 154.2 kg     Intake/Output Summary (Last 24 hours) at 05/08/2020 1829 Last data filed at 05/07/2020 1929 Gross per 24 hour  Intake 551 ml  Output 2000 ml  Net -1449 ml   Net IO Since Admission: -2,921.84 mL [05/08/20 0608]  Pertinent Labs: CBC Latest Ref Rng & Units 05/07/2020 05/06/2020  05/05/2020  WBC 4.0 - 10.5 K/uL 8.6 8.0 8.0  Hemoglobin 13.0 - 17.0 g/dL 7.0(L) 7.2(L) 7.6(L)  Hematocrit 39.0 - 52.0 % 23.1(L) 22.6(L) 23.6(L)  Platelets 150 - 400 K/uL 204 186 211    CMP Latest Ref Rng & Units 05/07/2020 05/06/2020 05/05/2020  Glucose 70 - 99 mg/dL 219(H) 177(H) 173(H)  BUN 6 - 20 mg/dL 34(H) 22(H) 34(H)  Creatinine 0.61 - 1.24 mg/dL 5.10(H) 3.49(H) 4.99(H)  Sodium 135 - 145 mmol/L 136 136 134(L)  Potassium 3.5 - 5.1 mmol/L 4.0 3.6 3.3(L)  Chloride 98 - 111 mmol/L 99 100 99  CO2 22 - 32 mmol/L 28 26 27   Calcium 8.9 - 10.3 mg/dL 7.4(L) 7.5(L) 7.7(L)  Total Protein 6.5 - 8.1 g/dL - - -  Total Bilirubin 0.3 - 1.2 mg/dL - - -  Alkaline Phos 38 - 126 U/L - - -  AST 15 - 41 U/L - - -  ALT 0 - 44 U/L - - -    Imaging: No results found.  Assessment/Plan:   Principal Problem:   Sacral osteomyelitis (  [place]) Active Problems:   Insulin dependent type 2 diabetes mellitus (HCC)   HTN (hypertension)   History of anemia due to chronic kidney disease   Pressure injury of both heels, unstageable ([place])   Pneumonia due to COVID-19 virus   ESRD (end stage renal disease) (HCC)   Avascular necrosis of first metatarsal, left foot (HCC)   Osteomyelitis of dorsal first metatarsal, left foot (HCC)   Decubitus ulcer of sacral region, unstageable ([place])   Morbid obesity ([place])   Patient Summary: 49 y/o male withhistory of HTN, DMon insulin, ESRD, Anemiawho [facility] because the staff stated that he wasnt eating the way he usually does with recent diagnosis of COVID on 03/24/2020 admitted for [facility] with concern for sepsis from covid pneumonia, sacral, or foot wounds.   Sacral decubitus ulcer,unstageable Coccygeal osteomyelitis Patient's is on day 8 vancomycin and cefepime.  He is tolerating the medication well.  Denies any pain on his heels for bottom.  Minimally improved as of yesterday.  We will continue hydrotherapy and sharp debridement per  surgical team.  I am concerned that the wound will begin to close on the epithelial surface without filling and underneath creating significant tunnels and undermining.  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   -offloading with padded boots. -PT/OT recommending CIR, awaiting evaluation for candidacy   COVID-19: Resolved  ESRD on HD -Appreciate nephrology's assistance -Currently on TTS schedule may transition back to MWF when off COVID percautions  Diabetes Mellitus -NovoLog 5 units with meals -SSI   Diet: Normal IVF: VTE: Heparin Code: Full PT/OT recs: Pending ID:   Dispo: Anticipated discharge to Rehab pending bed placement.  Lawerance Cruel, D.O.  Internal Medicine Resident, PGY-2 [facility]  Pager: [phone] 6:08 AM, 05/08/2020   Please contact the on call pager after 5 pm and on weekends at [phone].

## 2020-05-08 NOTE — Progress Notes (Signed)
Physical Therapy Wound Treatment [name] MRN: [id number] Date of Birth: 1971/01/29  Today's Date: 05/08/2020 Time: 2119-4174 Time Calculation (min): 33 min  Subjective  Subjective: Pt asking when he can see his family Patient and Family Stated Goals: I really want to get healed up. Date of Onset:  (prior to admission) Prior Treatments: Dressing changes  Pain Score:  2/10  Wound Assessment  Pressure Injury 05/01/20 Buttocks Left Stage 4 - Full thickness tissue loss with exposed bone, tendon or muscle. packed with santyl gauze, dressed by WOCN, (Active)  Dressing Type ABD;Barrier Film (skin prep);Gauze (Comment) 05/08/20 1559  Dressing Changed;Clean;Dry;Intact 05/08/20 1559  Dressing Change Frequency Daily 05/08/20 1559  State of Healing Early/partial granulation 05/08/20 1559  Site / Wound Assessment Pink;Yellow 05/08/20 1559  % Wound base Red or Granulating 45% 05/07/20 1447  % Wound base Yellow/Fibrinous Exudate 55% 05/07/20 1447  % Wound base Black/Eschar 0% 05/07/20 1447  % Wound base Other/Granulation Tissue (Comment) 0% 05/07/20 1447  Peri-wound Assessment Pink;Intact 05/08/20 1559  Wound Length (cm) 6 cm 05/05/20 1000  Wound Width (cm) 6.5 cm 05/05/20 1000  Wound Depth (cm) 3 cm 05/05/20 1000  Wound Surface Area (cm^2) 39 cm^2 05/05/20 1000  Wound Volume (cm^3) 117 cm^3 05/05/20 1000  Undermining (cm) 4 cm 12*, 3 cm 9*, 3 cm 3* 05/01/20 1300  Margins Unattached edges (unapproximated) 05/08/20 1559  Drainage Amount Moderate 05/08/20 1559  Drainage Description Serosanguineous 05/08/20 1559  Treatment Debridement (Selective);Hydrotherapy (Pulse lavage);Packing (Saline gauze) 05/08/20 1559   Santyl applied to wound bed prior to applying dressing.    Hydrotherapy Pulsed lavage therapy - wound location: L buttock wound Pulsed Lavage with Suction (psi): 12 psi Pulsed Lavage with Suction - Normal Saline Used: 1000 mL Pulsed Lavage Tip: Tip with  splash shield Selective Debridement Selective Debridement - Location: buttock wound Selective Debridement - Tools Used: Forceps;Scissors Selective Debridement - Tissue Removed: yellow unviable tissue   Wound Assessment and Plan  Wound Therapy - Assess/Plan/Recommendations Wound Therapy - Clinical Statement: Progressing slowly with conservative debridement of yellow slough. Wound continues to bleed easily. Wound bed appearance improving. Hydrotherapy will continue to follow for selective removal of unviable tissue, to decrease bioburden and promote wound bed healing. Wound Therapy - Functional Problem List: decreased mobility Factors Delaying/Impairing Wound Healing: Diabetes Mellitus;Altered sensation;Immobility Hydrotherapy Plan: Debridement;Dressing change;Patient/family education;Pulsatile lavage with suction Wound Therapy - Frequency: 6X / week Wound Therapy - Current Recommendations: PT Wound Therapy - Follow Up Recommendations: Skilled nursing facility Wound Plan: see above  Wound Therapy Goals- Improve the function of patient's integumentary system by progressing the wound(s) through the phases of wound healing (inflammation - proliferation - remodeling) by: Decrease Necrotic Tissue to: 15 Decrease Necrotic Tissue - Progress: Progressing toward goal Increase Granulation Tissue to: 85% Increase Granulation Tissue - Progress: Progressing toward goal Improve Drainage Characteristics: Min;Serous Improve Drainage Characteristics - Progress: Progressing toward goal Goals/treatment plan/discharge plan were made with and agreed upon by patient/family: Yes Time For Goal Achievement: 7 days Wound Therapy - Potential for Goals: Good  Goals will be updated until maximal potential achieved or discharge criteria met.  Discharge criteria: when goals achieved, discharge from hospital, MD decision/surgical intervention, no progress towards goals, refusal/missing three consecutive treatments  without notification or medical reason.  Fred [name]    Fred [name], PT, DPT Acute Rehabilitation Services Pager [phone] Office [phone]   Fred [name] 05/08/2020, 4:02 PM

## 2020-05-08 NOTE — Progress Notes (Signed)
Pt arrived to 5N08 on air bed. Pt alert and oriented. Call bell within reach. Bed in lowest position.

## 2020-05-08 NOTE — Progress Notes (Signed)
[facility] Progress Note   Subjective:   Patient seen in room today.  No c/o's, in good spirits. Sister on the phone.  Pt lived in [place] prior to coming to [facility] for rehab stay. He was in hospital in [place] for 3 wks and very sick w/ gallbladder problems.  He was started on dialysis there. She said he had some history of kidney dysfunction prior to that illness, from what the doctors told them.      Objective Vitals:   05/08/20 0352 05/08/20 0534 05/08/20 1356 05/08/20 1602  BP:  (!) 144/85 130/86 (!) 164/98  Pulse:  (!) 102 (!) 102 (!) 102  Resp:  14 18 18   Temp:  98.3 F (36.8 C) 98.3 F (36.8 C) 98.6 F (37 C)  TempSrc:  Oral Oral   SpO2:  97% 95% 99%  Weight: (!) 154.2 kg     Height:       Physical Exam   General:chroncially ill appearing male in NAD Heart:RRR, no mrg Lungs:mostly CTAB, BS decreased Abdomen:soft, NTND Extremities:trace edema b/l. B/l feet/ankle wrapped Dialysis Access: TDC    OP HD:  MWF (on COVID shift) , was TTS   4h  400/800  143.5kg  3K/2.5Ca  TDC  Hep 3000 Mircera17mcg q2wks - last 12/8 Venofer 100mg  qHD x10 - completed 6  Assessment/ Plan: 1. Bilat foot wounds+ osteo/ sacral decub osteo- per ID cont vanc/ maxipime, may need I&D and/or amputation per ID notes. 6 wks total IV abx thru 06/10/20 per ID.   2. COVID PNA- tested positive on 11/30.   Per admit.  3. ESRD- MWF HD now, return back to TTS when off isolation. Next HD Monday.  4. Hypertension/volume- BP's high, sig LBW loss. Today's wt inaccurate.  No vol excess or edema on exam.  5. Anemiaof CKD- Hgb 8.4. ESA just dosed 12/8. Hold iron d/t infection. 6. Secondary Hyperparathyroidism -CCa in goal.  Phos improved to 3.5 with supplementation. Not on VDRA or binders.  7. Nutrition- alb 1.5  Ok to use carb mod / w/fluid restrictions/ with low K, phos ,pro source and Nepro supplement  8. DM T 2 -  per admit     Kelly Splinter, MD 05/08/2020, 6:24  PM       Filed Weights   05/07/20 0500 05/08/20 0352  Weight: (!) 141.1 kg (!) 154.2 kg    Intake/Output Summary (Last 24 hours) at 05/08/2020 1824 Last data filed at 05/08/2020 1700 Gross per 24 hour  Intake 1032 ml  Output 2000 ml  Net -968 ml    Additional Objective Labs: Basic Metabolic Panel: Recent Labs  Lab 05/03/20 0140 05/04/20 0546 05/05/20 0058 05/06/20 0327 05/07/20 1633 05/08/20 0621  NA 131* 136   < > 136 136 137  K 4.2 3.2*   < > 3.6 4.0 4.0  CL 97* 99   < > 100 99 101  CO2 23 27   < > 26 28 27   GLUCOSE 347* 225*   < > 177* 219* 167*  BUN 28* 26*   < > 22* 34* 18  CREATININE 5.95* 4.42*   < > 3.49* 5.10* 3.42*  CALCIUM 7.5* 7.7*   < > 7.5* 7.4* 7.8*  PHOS 3.4 3.4  --   --  3.3  --    < > = values in this interval not displayed.   Liver Function Tests: Recent Labs  Lab 05/03/20 0140 05/04/20 0546 05/07/20 1633 05/08/20 0621  AST 33 41  --  18  ALT 18 24  --  18  ALKPHOS 326* 375*  --  274*  BILITOT 0.6 0.8  --  0.6  PROT 6.3* 6.0*  --  5.6*  ALBUMIN 1.5* 1.5* 1.4* 1.5*   CBC: Recent Labs  Lab 05/02/20 0357 05/03/20 0140 05/04/20 0546 05/05/20 0058 05/06/20 0327 05/07/20 1633 05/08/20 0621  WBC 6.7 9.6 8.1 8.0 8.0 8.6 9.3  NEUTROABS 5.9 8.1* 6.2  --   --   --   --   HGB 8.4* 8.4* 8.2* 7.6* 7.2* 7.0* 8.5*  HCT 28.4* 27.9* 25.8* 23.6* 22.6* 23.1* 28.1*  MCV 83.5 80.9 79.6* 80.0 81.3 84.0 84.9  PLT 270 263 286 211 186 204 174   Blood Culture    Component Value Date/Time   SDES BLOOD SITE NOT SPECIFIED 04/29/2020 1210   SPECREQUEST  04/29/2020 1210    BOTTLES DRAWN AEROBIC AND ANAEROBIC Blood Culture adequate volume   CULT  04/29/2020 1210    NO GROWTH 5 DAYS Performed at [facility], [place] [address] [place], [place] [address]    REPTSTATUS 05/04/2020 FINAL 04/29/2020 1210   CBG: Recent Labs  Lab 05/07/20 1215 05/07/20 2202 05/08/20 0729 05/08/20 1159 05/08/20 1644  GLUCAP 120* 148* 166* 140* 187*   Iron  Studies:  No results for input(s): IRON, TIBC, TRANSFERRIN, FERRITIN in the last 72 hours. Lab Results  Component Value Date   INR 1.2 04/29/2020   Studies/Results: No results found.  Medications: . ceFEPime (MAXIPIME) IV 1 g (05/07/20 2159)  . vancomycin Stopped (05/07/20 1843)   . (feeding supplement) PROSource Plus  30 mL Oral BID BM  . sodium chloride   Intravenous Once  . amLODipine  10 mg Oral Daily  . Chlorhexidine Gluconate Cloth  6 each Topical Q0600  . collagenase   Topical BID  . [START ON 05/09/2020] famotidine  20 mg Oral QHS  . feeding supplement (NEPRO CARB STEADY)  237 mL Oral BID BM  . heparin injection (subcutaneous)  5,000 Units Subcutaneous Q8H  . insulin aspart  0-5 Units Subcutaneous QHS  . insulin aspart  0-9 Units Subcutaneous TID WC  . insulin aspart  5 Units Subcutaneous TID WC  . lidocaine-EPINEPHrine  20 mL Infiltration Once  . metoCLOPramide  5 mg Oral TID AC  . multivitamin  1 tablet Oral QHS  . senna  1 tablet Oral BID  . sodium chloride flush  3 mL Intravenous Q12H

## 2020-05-09 LAB — GLUCOSE, CAPILLARY
Glucose-Capillary: 153 mg/dL — ABNORMAL HIGH (ref 70–99)
Glucose-Capillary: 91 mg/dL (ref 70–99)
Glucose-Capillary: 154 mg/dL — ABNORMAL HIGH (ref 70–99)
Glucose-Capillary: 128 mg/dL — ABNORMAL HIGH (ref 70–99)

## 2020-05-09 MED ORDER — METOPROLOL TARTRATE 25 MG PO TABS
25.0000 mg | ORAL_TABLET | Freq: Two times a day (BID) | ORAL | Status: DC
  Administered 2020-05-09 (×2): 25 mg via ORAL
  Filled 2020-05-09 (×2): qty 1

## 2020-05-09 MED ORDER — METOPROLOL TARTRATE 50 MG PO TABS: 50.0000 mg | ORAL_TABLET | Freq: Two times a day (BID) | ORAL | Status: DC

## 2020-05-09 MED ORDER — INSULIN GLARGINE 100 UNIT/ML ~~LOC~~ SOLN
10.0000 [IU] | Freq: Every day | SUBCUTANEOUS | Status: DC
  Administered 2020-05-09 – 2020-05-24 (×16): 10 [IU] via SUBCUTANEOUS
  Filled 2020-05-09 (×17): qty 0.1

## 2020-05-09 NOTE — Progress Notes (Addendum)
HD#10 Subjective:  Overnight Events: no overnight events   Patient resting comfortably in bed. States that his back is a little sore but otherwise, he denies any other complaint. I encouraged PT and mobility as much as tolerated.   Objective:  Vital signs in last 24 hours: Vitals:   05/08/20 1356 05/08/20 1602 05/08/20 1929 05/09/20 0324  BP: 130/86 (!) 164/98 (!) 152/98 140/86  Pulse: (!) 102 (!) 102 (!) 102 95  Resp: 18 18 18 16   Temp: 98.3 F (36.8 C) 98.6 F (37 C) 98.7 F (37.1 C) 98.4 F (36.9 C)  TempSrc: Oral  Oral Oral  SpO2: 95% 99% 96% 96%  Weight:      Height:       Supplemental O2: Room Air SpO2: 96 % O2 Flow Rate (L/min):   Physical Exam:  Physical Exam Constitutional:      Appearance: Normal appearance. He is obese.  HENT:     Head: Normocephalic and atraumatic.  Eyes:     Extraocular Movements: Extraocular movements intact.  Cardiovascular:     Rate and Rhythm: Tachycardia present.     Pulses: Normal pulses.     Heart sounds: Normal heart sounds.  Pulmonary:     Effort: Pulmonary effort is normal. No respiratory distress.  Abdominal:     General: There is no distension.     Palpations: Abdomen is soft.     Tenderness: There is no abdominal tenderness.  Musculoskeletal:        General: Normal range of motion.     Cervical back: Normal range of motion.     Right lower leg: No edema.     Left lower leg: No edema.  Skin:    General: Skin is warm and dry.     Findings: Lesion (bilateral LE wounds and sacral wound covered  with bandages. ) present.  Neurological:     Mental Status: He is alert and oriented to person, place, and time. Mental status is at baseline.  Psychiatric:        Mood and Affect: Mood normal.     Filed Weights   05/07/20 0500 05/08/20 0352  Weight: (!) 141.1 kg (!) 154.2 kg     Intake/Output Summary (Last 24 hours) at 05/09/2020 0817 Last data filed at 05/08/2020 1700 Gross per 24 hour  Intake 717 ml  Output --   Net 717 ml   Net IO Since Admission: -2,204.84 mL [05/09/20 0817]  Recent Labs    05/08/20 1159 05/08/20 1644 05/08/20 2045  GLUCAP 140* 187* 165*     Pertinent Labs: CBC Latest Ref Rng & Units 05/08/2020 05/07/2020 05/06/2020  WBC 4.0 - 10.5 K/uL 9.3 8.6 8.0  Hemoglobin 13.0 - 17.0 g/dL 8.5(L) 7.0(L) 7.2(L)  Hematocrit 39.0 - 52.0 % 28.1(L) 23.1(L) 22.6(L)  Platelets 150 - 400 K/uL 174 204 186    CMP Latest Ref Rng & Units 05/08/2020 05/07/2020 05/06/2020  Glucose 70 - 99 mg/dL 167(H) 219(H) 177(H)  BUN 6 - 20 mg/dL 18 34(H) 22(H)  Creatinine 0.61 - 1.24 mg/dL 3.42(H) 5.10(H) 3.49(H)  Sodium 135 - 145 mmol/L 137 136 136  Potassium 3.5 - 5.1 mmol/L 4.0 4.0 3.6  Chloride 98 - 111 mmol/L 101 99 100  CO2 22 - 32 mmol/L 27 28 26   Calcium 8.9 - 10.3 mg/dL 7.8(L) 7.4(L) 7.5(L)  Total Protein 6.5 - 8.1 g/dL 5.6(L) - -  Total Bilirubin 0.3 - 1.2 mg/dL 0.6 - -  Alkaline Phos 38 - 126  U/L 274(H) - -  AST 15 - 41 U/L 18 - -  ALT 0 - 44 U/L 18 - -    Imaging: No results found.  Assessment/Plan:   Principal Problem:   Sacral osteomyelitis (HCC) Active Problems:   Insulin dependent type 2 diabetes mellitus (HCC)   HTN (hypertension)   History of anemia due to chronic kidney disease   Pressure injury of both heels, unstageable (HCC)   Pneumonia due to COVID-19 virus   ESRD (end stage renal disease) (HCC)   Avascular necrosis of first metatarsal, left foot (HCC)   Osteomyelitis of dorsal first metatarsal, left foot (HCC)   Decubitus ulcer of sacral region, unstageable ([place])   Morbid obesity ([place])   Patient Summary: 49 y/o male withhistory of HTN, DMon insulin, ESRD, [facility]who [facility] because the staff stated that he wasnt eating the way he usually does with recent diagnosis of [place] on 03/24/2020 admitted for [place] with concern for sepsis from covid pneumonia, sacral, or foot wounds.   Sacral decubitus ulcer,unstageable Coccygeal  osteomyelitis - Day 9 of vancomycin and cefepime tolerating them well - Continue hydrotherapy and sharp debridement per surgery -Continue off-loading sacral and heel wounds, continue air mattress - Encouraged PT and mobility as much as tolerated  Pressure injury of bilateral heels, unstagable Dry gangrene of left 4th toe Osteomyelitis of left first metatarsal and right fifth metatarsal -Continue broad-spectrum antibioticst.  -offloading with padded boots. -PT/OT recommending CIR, awaiting evaluation for candidacy   ESRD on HD -Appreciate nephrology's assistance -Currently on TTS schedule may transition back to MWF when off COVID percautions  Diabetes Mellitus Elevated CGB in the 140-190s. His AM CBG was 166 and post-prandials are between 140-180's. Received a total of 15 units novolog yesterday. No longer on steroids. Wil start long acting insulin. - Start Lantus 10 units qHS - D/c prandial novolog - Cont SSI  HTN: Patients blood pressures has been consistently elevated on amlodipine 10 mg. Will restart home metoprolol at a reduced dose of 25 mg BID and titrate up.  - Cont. Amlodipine 10 mg daily - Start metoprolol 25 mg BID  Deconditioning Generalized weakness: - Cont PT  - Help patient sit up to the side of the bed Twice daily.  - Encourage mobility.   Diet: Cardiac diet IVF: PO intake VTE: heparin injection 5,000 Units Start: 05/04/20 0630 Code: Full PT/OT: CIR, currently receiving hydrotherapy and sharp debridement of sacrum ID: cefepime, vancomycin (12/10). Metronidazole (12/10 -12/13). Remdesivir (12/11-12/3)   Anticipated discharge to Rehab pending placement.  Lawerance Cruel, D.O.  Internal Medicine Resident, PGY-2 [facility]  Pager: [phone] 8:17 AM, 05/09/2020   Please contact the on call pager after 5 pm and on weekends at [phone].

## 2020-05-09 NOTE — Plan of Care (Signed)
°  Problem: Education: Goal: Knowledge of risk factors and measures for prevention of condition will improve Outcome: Progressing   Problem: Respiratory: Goal: Will maintain a patent airway Outcome: Progressing   Problem: Health Behavior/Discharge Planning: Goal: Ability to manage health-related needs will improve Outcome: Progressing   Problem: Clinical Measurements: Goal: Will remain free from infection Outcome: Progressing

## 2020-05-09 NOTE — Progress Notes (Signed)
[facility] Progress Note   Subjective:   Patient seen in room, no c/o's.       Objective Vitals:   05/08/20 1356 05/08/20 1602 05/08/20 1929 05/09/20 0324  BP: 130/86 (!) 164/98 (!) 152/98 140/86  Pulse: (!) 102 (!) 102 (!) 102 95  Resp: 18 18 18 16   Temp: 98.3 F (36.8 C) 98.6 F (37 C) 98.7 F (37.1 C) 98.4 F (36.9 C)  TempSrc: Oral  Oral Oral  SpO2: 95% 99% 96% 96%  Weight:      Height:       Physical Exam   General:chroncially ill appearing male in NAD Heart:RRR, no mrg Lungs:mostly CTAB, BS decreased Abdomen:soft, NTND Extremities:trace edema b/l. B/l feet/ankle wrapped Dialysis Access: TDC    OP HD:  MWF (on COVID shift) , was TTS   4h  400/800  143.5kg  3K/2.5Ca  TDC  Hep 3000 Mircera169mcg q2wks - last 12/8 Venofer 100mg  qHD x10 - completed 6  Assessment/ Plan: 1. Sacral decub/ coccygeal osteo- per ID cont vanc/ maxipime, D#9. Continues hydrotherapy. 6 wks total IV abx thru 06/10/20 per ID.  2. Osteomyelitis L foot/ pressure injury bilat heels - per ID cont vanc/ maxipime. PT/OT recommending CIR.  3. COVID PNA- tested positive on 11/30, is off isolation now.  4. ESRD- MWF HD now, may return back to TTS when off isolation. Next HD Monday.  5. Hypertension/volume- Wt's up and BP's up, ^ UF 3- 4L w/ next HD.   6. Anemiaof CKD- Hgb 8.4. ESA just dosed 12/8. Hold iron d/t infection. 7. Secondary Hyperparathyroidism -CCa in goal.  Phos improved to 3.5 with supplementation. Not on VDRA or binders.  8. Nutrition- alb 1.5  Ok to use carb mod / w/fluid restrictions/ with low K, phos ,pro source and Nepro supplement  9. DM T 2 -  per admit     Kelly Splinter, MD 05/09/2020, 1:09 PM       Filed Weights   05/07/20 0500 05/08/20 0352  Weight: (!) 141.1 kg (!) 154.2 kg    Intake/Output Summary (Last 24 hours) at 05/09/2020 1309 Last data filed at 05/09/2020 1200 Gross per 24 hour  Intake 477 ml  Output 400 ml  Net 77 ml     Additional Objective Labs: Basic Metabolic Panel: Recent Labs  Lab 05/03/20 0140 05/04/20 0546 05/05/20 0058 05/06/20 0327 05/07/20 1633 05/08/20 0621  NA 131* 136   < > 136 136 137  K 4.2 3.2*   < > 3.6 4.0 4.0  CL 97* 99   < > 100 99 101  CO2 23 27   < > 26 28 27   GLUCOSE 347* 225*   < > 177* 219* 167*  BUN 28* 26*   < > 22* 34* 18  CREATININE 5.95* 4.42*   < > 3.49* 5.10* 3.42*  CALCIUM 7.5* 7.7*   < > 7.5* 7.4* 7.8*  PHOS 3.4 3.4  --   --  3.3  --    < > = values in this interval not displayed.   Liver Function Tests: Recent Labs  Lab 05/03/20 0140 05/04/20 0546 05/07/20 1633 05/08/20 0621  AST 33 41  --  18  ALT 18 24  --  18  ALKPHOS 326* 375*  --  274*  BILITOT 0.6 0.8  --  0.6  PROT 6.3* 6.0*  --  5.6*  ALBUMIN 1.5* 1.5* 1.4* 1.5*   CBC: Recent Labs  Lab 05/03/20 0140 05/04/20 0546 05/05/20 0058  05/06/20 0327 05/07/20 1633 05/08/20 0621  WBC 9.6 8.1 8.0 8.0 8.6 9.3  NEUTROABS 8.1* 6.2  --   --   --   --   HGB 8.4* 8.2* 7.6* 7.2* 7.0* 8.5*  HCT 27.9* 25.8* 23.6* 22.6* 23.1* 28.1*  MCV 80.9 79.6* 80.0 81.3 84.0 84.9  PLT 263 286 211 186 204 174   Blood Culture    Component Value Date/Time   SDES BLOOD SITE NOT SPECIFIED 04/29/2020 1210   SPECREQUEST  04/29/2020 1210    BOTTLES DRAWN AEROBIC AND ANAEROBIC Blood Culture adequate volume   CULT  04/29/2020 1210    NO GROWTH 5 DAYS Performed at [facility], [place] [address], [place], [place] [address]    REPTSTATUS 05/04/2020 FINAL 04/29/2020 1210   CBG: Recent Labs  Lab 05/08/20 1159 05/08/20 1644 05/08/20 2045 05/09/20 0834 05/09/20 1211  GLUCAP 140* 187* 165* 153* 91   Iron Studies:  No results for input(s): IRON, TIBC, TRANSFERRIN, FERRITIN in the last 72 hours. Lab Results  Component Value Date   INR 1.2 04/29/2020   Studies/Results: No results found.  Medications: . ceFEPime (MAXIPIME) IV 1 g (05/08/20 2200)  . vancomycin Stopped (05/07/20 1843)   . (feeding  supplement) PROSource Plus  30 mL Oral BID BM  . amLODipine  10 mg Oral Daily  . Chlorhexidine Gluconate Cloth  6 each Topical Q0600  . collagenase   Topical BID  . famotidine  20 mg Oral QHS  . feeding supplement (NEPRO CARB STEADY)  237 mL Oral BID BM  . heparin injection (subcutaneous)  5,000 Units Subcutaneous Q8H  . insulin aspart  0-5 Units Subcutaneous QHS  . insulin aspart  0-9 Units Subcutaneous TID WC  . insulin aspart  5 Units Subcutaneous TID WC  . insulin glargine  10 Units Subcutaneous QHS  . lidocaine-EPINEPHrine  20 mL Infiltration Once  . metoCLOPramide  5 mg Oral TID AC  . metoprolol tartrate  25 mg Oral BID  . multivitamin  1 tablet Oral QHS  . senna  1 tablet Oral BID  . sodium chloride flush  3 mL Intravenous Q12H

## 2020-05-10 ENCOUNTER — Encounter (HOSPITAL_COMMUNITY): Payer: Self-pay | Admitting: Internal Medicine

## 2020-05-10 LAB — RENAL FUNCTION PANEL
Albumin: 1.4 g/dL — ABNORMAL LOW (ref 3.5–5.0)
Anion gap: 9 (ref 5–15)
BUN: 39 mg/dL — ABNORMAL HIGH (ref 6–20)
CO2: 25 mmol/L (ref 22–32)
Calcium: 8 mg/dL — ABNORMAL LOW (ref 8.9–10.3)
Chloride: 99 mmol/L (ref 98–111)
Creatinine, Ser: 4.93 mg/dL — ABNORMAL HIGH (ref 0.61–1.24)
GFR, Estimated: 14 mL/min — ABNORMAL LOW (ref >60)
Glucose, Bld: 225 mg/dL — ABNORMAL HIGH (ref 70–99)
Phosphorus: 5.1 mg/dL — ABNORMAL HIGH (ref 2.5–4.6)
Potassium: 5 mmol/L (ref 3.5–5.1)
Sodium: 133 mmol/L — ABNORMAL LOW (ref 135–145)

## 2020-05-10 LAB — GLUCOSE, CAPILLARY
Glucose-Capillary: 107 mg/dL — ABNORMAL HIGH (ref 70–99)
Glucose-Capillary: 142 mg/dL — ABNORMAL HIGH (ref 70–99)
Glucose-Capillary: 149 mg/dL — ABNORMAL HIGH (ref 70–99)
Glucose-Capillary: 154 mg/dL — ABNORMAL HIGH (ref 70–99)

## 2020-05-10 LAB — CBC
HCT: 27.5 % — ABNORMAL LOW (ref 39.0–52.0)
Hemoglobin: 8.2 g/dL — ABNORMAL LOW (ref 13.0–17.0)
MCH: 25.9 pg — ABNORMAL LOW (ref 26.0–34.0)
MCHC: 29.8 g/dL — ABNORMAL LOW (ref 30.0–36.0)
MCV: 86.8 fL (ref 80.0–100.0)
Platelets: 183 10*3/uL (ref 150–400)
RBC: 3.17 MIL/uL — ABNORMAL LOW (ref 4.22–5.81)
RDW: 18.4 % — ABNORMAL HIGH (ref 11.5–15.5)
WBC: 7.1 10*3/uL (ref 4.0–10.5)
nRBC: 0 % (ref 0.0–0.2)

## 2020-05-10 LAB — VANCOMYCIN, RANDOM: Vancomycin Rm: 18

## 2020-05-10 MED ORDER — HEPARIN SODIUM (PORCINE) 1000 UNIT/ML DIALYSIS: 3000.0000 [IU] | Freq: Once | INTRAMUSCULAR | Status: AC

## 2020-05-10 MED ORDER — COLLAGENASE 250 UNIT/GM EX OINT
TOPICAL_OINTMENT | Freq: Every day | CUTANEOUS | Status: DC
  Filled 2020-05-10: qty 30

## 2020-05-10 MED ORDER — METOPROLOL TARTRATE 25 MG PO TABS
25.0000 mg | ORAL_TABLET | Freq: Two times a day (BID) | ORAL | Status: DC
  Administered 2020-05-10: 25 mg via ORAL
  Filled 2020-05-10 (×2): qty 1

## 2020-05-10 MED ORDER — HEPARIN SODIUM (PORCINE) 1000 UNIT/ML IJ SOLN
INTRAMUSCULAR | Status: AC
  Administered 2020-05-10: 18:00:00 3800 [IU] via INTRAVENOUS
  Filled 2020-05-10: qty 4

## 2020-05-10 MED ORDER — HEPARIN SODIUM (PORCINE) 1000 UNIT/ML IJ SOLN
INTRAMUSCULAR | Status: AC
  Administered 2020-05-10: 15:00:00 3000 [IU] via INTRAVENOUS_CENTRAL
  Filled 2020-05-10: qty 3

## 2020-05-10 MED ORDER — VANCOMYCIN HCL IN DEXTROSE 1-5 GM/200ML-% IV SOLN
INTRAVENOUS | Status: AC
  Administered 2020-05-10: 16:00:00 1000 mg via INTRAVENOUS
  Filled 2020-05-10: qty 200

## 2020-05-10 MED ORDER — METOPROLOL TARTRATE 50 MG PO TABS: 50.0000 mg | ORAL_TABLET | Freq: Two times a day (BID) | ORAL | Status: DC

## 2020-05-10 NOTE — Progress Notes (Signed)
[facility] Progress Note   Subjective: Seen in room. No C/Os. HD ordered for today.   Objective Vitals:   05/09/20 1946 05/10/20 0404 05/10/20 0408 05/10/20 0814  BP: (!) 153/92 (!) 160/98  136/73  Pulse: 98 93  96  Resp: 18 16  17   Temp: 99.2 F (37.3 C) 98.7 F (37.1 C)  98.9 F (37.2 C)  TempSrc: Oral Oral  Oral  SpO2: 98% 97%  98%  Weight:   (!) 158.3 kg   Height:       Physical Exam General: Chronically ill appearing male in NAD Heart: S1,S2 RRR No M/G/R Lungs: slightly decreased in bases otherwise CTAB Abdomen: obese active BS, NT Extremities: Trace BLE edema R > L. Ulcers in various stages of healing on feet.  Dialysis Access: [facility] dr[facility] intact   Additional Objective Labs: Basic Metabolic Panel: Recent Labs  Lab 05/04/20 0546 05/05/20 0058 05/06/20 0327 05/07/20 1633 05/08/20 0621  NA 136   < > 136 136 137  K 3.2*   < > 3.6 4.0 4.0  CL 99   < > 100 99 101  CO2 27   < > 26 28 27   GLUCOSE 225*   < > 177* 219* 167*  BUN 26*   < > 22* 34* 18  CREATININE 4.42*   < > 3.49* 5.10* 3.42*  CALCIUM 7.7*   < > 7.5* 7.4* 7.8*  PHOS 3.4  --   --  3.3  --    < > = values in this interval not displayed.   Liver Function Tests: Recent Labs  Lab 05/04/20 0546 05/07/20 1633 05/08/20 0621  AST 41  --  18  ALT 24  --  18  ALKPHOS 375*  --  274*  BILITOT 0.8  --  0.6  PROT 6.0*  --  5.6*  ALBUMIN 1.5* 1.4* 1.5*   No results for input(s): LIPASE, AMYLASE in the last 168 hours. CBC: Recent Labs  Lab 05/04/20 0546 05/05/20 0058 05/06/20 0327 05/07/20 1633 05/08/20 0621  WBC 8.1 8.0 8.0 8.6 9.3  NEUTROABS 6.2  --   --   --   --   HGB 8.2* 7.6* 7.2* 7.0* 8.5*  HCT 25.8* 23.6* 22.6* 23.1* 28.1*  MCV 79.6* 80.0 81.3 84.0 84.9  PLT 286 211 186 204 174   Blood Culture    Component Value Date/Time   SDES BLOOD SITE NOT SPECIFIED 04/29/2020 1210   SPECREQUEST  04/29/2020 1210    BOTTLES DRAWN AEROBIC AND ANAEROBIC Blood Culture adequate  volume   CULT  04/29/2020 1210    NO GROWTH 5 DAYS Performed at [facility] [address] [place],  [address]    REPTSTATUS 05/04/2020 FINAL 04/29/2020 1210    Cardiac Enzymes: No results for input(s): CKTOTAL, CKMB, CKMBINDEX, TROPONINI in the last 168 hours. CBG: Recent Labs  Lab 05/09/20 1211 05/09/20 1707 05/09/20 2126 05/10/20 0654 05/10/20 1128  GLUCAP 91 154* 128* 107* 142*   Iron Studies: No results for input(s): IRON, TIBC, TRANSFERRIN, FERRITIN in the last 72 hours. @lablastinr3 @ Studies/Results: No results found. Medications:  ceFEPime (MAXIPIME) IV 1 g (05/09/20 2119)   vancomycin Stopped (05/07/20 1843)    (feeding supplement) PROSource Plus  30 mL Oral BID BM   amLODipine  10 mg Oral Daily   Chlorhexidine Gluconate Cloth  6 each Topical Q0600   collagenase   Topical BID   famotidine  20 mg Oral QHS   feeding supplement (  NEPRO CARB STEADY)  237 mL Oral BID BM   heparin injection (subcutaneous)  5,000 Units Subcutaneous Q8H   insulin aspart  0-5 Units Subcutaneous QHS   insulin aspart  0-9 Units Subcutaneous TID WC   insulin glargine  10 Units Subcutaneous QHS   lidocaine-EPINEPHrine  20 mL Infiltration Once   metoCLOPramide  5 mg Oral TID AC   metoprolol tartrate  25 mg Oral BID   multivitamin  1 tablet Oral QHS   senna  1 tablet Oral BID   sodium chloride flush  3 mL Intravenous Q12H      OP HD:  [facility] MWF (on COVID shift) , was TTS   4h  400/800  143.5kg  3K/2.5Ca  TDC   -Heparin 3000 units IV TIW -Mircera133mcg IV q2wks - last 12/8 -Venofer 100mg  IV qHD x10 - completed 6/10 doses  Assessment/ Plan: 1. Sacral decub/ coccygeal osteo-per ID cont vanc/ maxipime, D#9. Continues hydrotherapy. 6 wks total IV abx thru 06/10/20 per ID.  2. Osteomyelitis L foot/ pressure injury bilat heels - per ID cont vanc/ maxipime. PT/OT recommending CIR.  3. COVID PNA- tested positive on 11/30, is off isolation now.  4. ESRD-  MWF HD now, may return back to TTS when off isolation. Next HD today.  5. Hypertension/volume- Wt's up and BP's up, ^ UF 3- 4L today. 6. Anemiaof CKD- Hgb 8.5. ESA just dosed 12/8. Hold iron d/t infection. Follow HGB.  7. Secondary Hyperparathyroidism -CCa in goal.  Phos improved to 3.5 with supplementation. Not on VDRA or binders.  8. Nutrition-alb 1.5 Ok to use carb mod /w/fluid restrictions/ with low K, phos ,prosourceand Nepro supplement  9. DM T 2 -  per admit  Lin Glazier H. Akshath Mccarey NP-C 05/10/2020, 11:46 AM  [employer] [phone]

## 2020-05-10 NOTE — Care Management Important Message (Signed)
Important Message  [name] MRN: [id number] Date of Birth: 1970/06/22   Medicare Important Message Given:  Yes     Sherard Sutch P Albright 05/10/2020, 2:22 PM

## 2020-05-10 NOTE — Progress Notes (Addendum)
Subjective:  Patient evaluated at bedside this AM. States he's doing okay, pain is well-controlled. Mentions PT was not able to work with him over the weekend but is ready to work with them today. He notes he is tolerating po well and having regular BM's. Discussed plan for pressure ulcers and eventual d/c to CIR.  Objective:  Vital signs in last 24 hours: Vitals:   05/09/20 1500 05/09/20 1946 05/10/20 0404 05/10/20 0408  BP: (!) 150/75 (!) 153/92 (!) 160/98   Pulse: 88 98 93   Resp:  18 16   Temp: 98.7 F (37.1 C) 99.2 F (37.3 C) 98.7 F (37.1 C)   TempSrc: Oral Oral Oral   SpO2: 96% 98% 97%   Weight:    (!) 158.3 kg  Height:       Physical Exam: General: Pleasant, laying in bed, no acute distress Feet: 4th left toe necrotic. Tip of left fifth toe with small pressure injury. Unstageable pressure wound on lateral left foot w/ eschar and fibrinous exudate (first picture below). Small unstageable pressure wound over left lateral malleolus with surrounding erythema, no purulence (second picture below). Unstageable pressure wound on right lateral foot with small amount of purulence. Unstageable pressure wound over R heel.  Neuro: Awake, alert, oriented. Moving all four extremities appropriately.      Assessment/Plan: Mr. Rama is 49yo male with ESRD, hypertension, insulin-dependent type II diabetes mellitus admitted 04/29/20 for sacral decubitus ulcer and COVID-19 pneumonia, now improved on RA and long-term antibiotics.  Principal Problem:   Sacral osteomyelitis ([place]) Active Problems:   Insulin dependent type 2 diabetes mellitus (HCC)   HTN (hypertension)   History of anemia due to chronic kidney disease   Pressure injury of both heels, unstageable ([place])   Pneumonia due to COVID-19 virus   ESRD (end stage renal disease) (HCC)   Avascular necrosis of first metatarsal, left foot (HCC)   Osteomyelitis of dorsal first metatarsal, left foot (HCC)   Decubitus ulcer of sacral  region, unstageable ([place])   Morbid obesity (HCC)  #Unstageable sacral decubitus ulcer #Coccygeal osteomyelitis No plans for surgical intervention at this time per surgery. Will continue broad-spectrum antibiotics per ID. Important to continue off-loading wounds, including with air mattress. CIR following for admission when bed available. - Day 10 vanc, cefepime (end date 06/10/20) - Air mattress, frequent off-loading of sacral wound - Hydrotherapy of sacral wound - PT/OT  #Unstageable pressure wounds: L lateral foot, L lateral malleolus, R lateral foot, R heel #Dry gangrene of L 4th toe #Osteomyelitis of left first metatarsal, right fifth metatarsal Uncovered bandaged wound on L lateral foot this AM, revealing fibrinous exudate with detaching eschar. Plan to debride area at bedside this afternoon. Patient will need Prevalon boots afterward. - C/w antibiotics as above - Debridement of left lateral pressure ulcer today followed by enzymatic debridement - Pressure dressings - Prevalon boots - Wound care  #ESRD on HD Nephrology following, patient to have HD today to return to regular MWF schedule - HD today per nephrology - Appreciate nephrology recs  #Type II diabetes mellitus A1c 5.6 on 12/14. Sugars have been controlled over last 24h. AM CBG 107. Will continue with current regimen and titrate as needed. - CBG monitoring w/ meals, QHS - C/w Lantus 10u, SSI TID WC, QHS  #Hypertension Blood pressures elevated overnight, SBP 150-160's. Yesterday started home metoprolol at half dose, but believe elevated BP's most likely to be due to volume status as he is due for HD today. Will  re-assess tomorrow AM and titrate metoprolol as necessary. - HD today - C/w metoprolol tartrate 25mg  BID, amlodipine 10mg  qd  #Deconditioning #Generalized weakness Overall patient weak from long hospitalization, COVID-19. Continues to work with physical therapy. Plan for CIR admission once bed is available. -  PT/OT - CIR pending bed availability   DIET: CM IVF: n/a DVT PPX: subQ heparin BOWEL: [place] CODE: Full FAM COM: Patient's sister, [name], discussed case with [place] via telephone this morning  Prior to Admission Living Arrangement: SNF Anticipated Discharge Location: CIR Barriers to Discharge: medical management, CIR bed availability Dispo: Anticipated discharge in approximately 1-2 day(s).   Sanjuan Dame, MD 05/10/2020, 5:40 AM Pager: [phone] After 5pm on weekdays and 1pm on weekends: On Call pager [phone]

## 2020-05-10 NOTE — Progress Notes (Addendum)
PT Hydrotherapy Cancellation Note  Patient Details Name: Fred [name] MRN: [id number] DOB: May 16, 1971   Cancelled Treatment:    Reason Eval/Treat Not Completed: Other (comment) Pt receiving dialysis in room. Discussed with dialysis RN, who stated pt could not be rolled to perform hydrotherapy due to access site. Will perform tomorrow.  Wyona Almas, PT, DPT Acute Rehabilitation Services Pager [phone] Office [phone]     Deno Etienne 05/10/2020, 2:25 PM

## 2020-05-10 NOTE — Progress Notes (Signed)
Inpatient Rehab Admissions Coordinator:   Met with patient at bedside, sister [name]) on the phone.  We discussed typical goals for CIR to be d/c home and average length of stay to be about 2 weeks.  I did let them know I felt Fred [name] would likely need 3-4 weeks given current level of function, and would receive hydrotherapy while admitted.  Fred [name] lives with his father, who is independent but cannot provide any physical assist at discharge.  Ongoing hydrotherapy needs could be a barrier to discharge, but as of now, no plan for surgical intervention.  Will continue to follow for timing of possible rehab admission pending stability and bed availability.  I do not have a bed today.  Shann Medal, PT, DPT Admissions Coordinator [phone] 05/10/20  11:09 AM

## 2020-05-10 NOTE — Progress Notes (Signed)
Pharmacy Antibiotic Note- follow up   Fred [name] is a 49 y.o. male admitted on 04/29/2020 with sepsis.  Pharmacy has been consulted for vancomycin and cefepime dosing for sacral decubitus ulcer and coccygeal osteomyelitis.   Currently on MWF HD schedule with plan to return back to TTS when off isolation Pre-HD vancomycin level is therapeutic at 18 mcg/mL (goal 15-25 mcg/mL) Afebrile, WBC WNL HD today  Plan: Continue vanc 1gm IV qHD MWF for now Continue cefepime 1gm IV Q24H - change to 2gm IV qHD when back on regular schedule Monitor HD schedule/tolerance, clinical progress, PRN vanc level  ID plans to continue vancomycin/cefepime x 6 weeks, end date 06/11/19   Height: 6\' 6"  (198.1 cm) Weight: (!) 158.3 kg (349 lb) IBW/kg (Calculated) : 91.4  Temp (24hrs), Avg:98.7 F (37.1 C), Min:98.1 F (36.7 C), Max:99.2 F (37.3 C)  Recent Labs  Lab 05/05/20 0058 05/06/20 0327 05/07/20 1633 05/08/20 0621 05/10/20 1226 05/10/20 1426  WBC 8.0 8.0 8.6 9.3  --  7.1  CREATININE 4.99* 3.49* 5.10* 3.42*  --  4.93*  VANCORANDOM  --   --   --   --  18  --     Estimated Creatinine Clearance: 30.3 mL/min (A) (by C-G formula based on SCr of 4.93 mg/dL (H)).    Allergies  Allergen Reactions  . Atorvastatin Hives    NOT on MAR   Vanc 12/9>> (1/20) Cefepime 12/9>> (1/20)  Remdesivir 12/9>>12/14  12/20 VL = 18 mcg/mL on 1g qMWF >> no change  12/9 Bcx: negative  12/9: COVID Positive  [name] D. Mina Marble, PharmD, BCPS, [place] 05/10/2020, 4:17 PM

## 2020-05-10 NOTE — Progress Notes (Signed)
PT Cancellation Note  Patient Details Name: Montez Cuda MRN: [id number] DOB: 14-Oct-1970   Cancelled Treatment:    Reason Eval/Treat Not Completed: Patient at procedure or test/unavailable. Patient with HD team in room getting set up. Will re-attempt mobility at later time/day as appropriate.    Chrisie Jankovich 05/10/2020, 2:17 PM

## 2020-05-11 LAB — GLUCOSE, CAPILLARY
Glucose-Capillary: 125 mg/dL — ABNORMAL HIGH (ref 70–99)
Glucose-Capillary: 182 mg/dL — ABNORMAL HIGH (ref 70–99)
Glucose-Capillary: 198 mg/dL — ABNORMAL HIGH (ref 70–99)
Glucose-Capillary: 149 mg/dL — ABNORMAL HIGH (ref 70–99)

## 2020-05-11 MED ORDER — COLLAGENASE 250 UNIT/GM EX OINT
TOPICAL_OINTMENT | Freq: Two times a day (BID) | CUTANEOUS | Status: DC
  Filled 2020-05-11: qty 30

## 2020-05-11 MED ORDER — DARBEPOETIN ALFA 100 MCG/0.5ML IJ SOSY
100.0000 ug | PREFILLED_SYRINGE | INTRAMUSCULAR | Status: DC
  Filled 2020-05-11: qty 0.5

## 2020-05-11 MED ORDER — METOPROLOL TARTRATE 50 MG PO TABS
50.0000 mg | ORAL_TABLET | Freq: Two times a day (BID) | ORAL | Status: DC
  Administered 2020-05-11 – 2020-05-26 (×27): 50 mg via ORAL
  Filled 2020-05-11 (×30): qty 1

## 2020-05-11 NOTE — Progress Notes (Signed)
Inpatient Rehab Admissions Coordinator:   I have no beds available for this patient to admit to CIR today.  Will continue to follow for timing of potential admission pending bed availability.  Shann Medal, PT, DPT Admissions Coordinator [phone] 05/11/20  11:52 AM

## 2020-05-11 NOTE — Progress Notes (Signed)
[facility] Progress Note   Subjective: seen in room. No C/Os. HD 12/20 without issues. Awaiting bed on circ.   Objective Vitals:   05/10/20 1739 05/10/20 1926 05/11/20 0439 05/11/20 0824  BP: (!) 150/74 (!) 162/81 (!) 146/76 134/81  Pulse: (!) 101 97 92 97  Resp: 20 18 18 17   Temp: (!) 97.5 F (36.4 C) 98.2 F (36.8 C) 98.1 F (36.7 C) 99.1 F (37.3 C)  TempSrc: Oral Oral Oral Oral  SpO2:  98% 97% 99%  Weight: (!) 155.1 kg     Height:       Physical Exam General: Chronically ill appearing male in NAD Heart: S1,S2 RRR No M/G/R Lungs: slightly decreased in bases otherwise CTAB Abdomen: obese active BS, NT Extremities: Trace BLE edema R > L. Ulcers in various stages of healing on feet.  Dialysis Access: [facility] drsg intact     Additional Objective Labs: Basic Metabolic Panel: Recent Labs  Lab 05/07/20 1633 05/08/20 0621 05/10/20 1426  NA 136 137 133*  K 4.0 4.0 5.0  CL 99 101 99  CO2 28 27 25   GLUCOSE 219* 167* 225*  BUN 34* 18 39*  CREATININE 5.10* 3.42* 4.93*  CALCIUM 7.4* 7.8* 8.0*  PHOS 3.3  --  5.1*   Liver Function Tests: Recent Labs  Lab 05/07/20 1633 05/08/20 0621 05/10/20 1426  AST  --  18  --   ALT  --  18  --   ALKPHOS  --  274*  --   BILITOT  --  0.6  --   PROT  --  5.6*  --   ALBUMIN 1.4* 1.5* 1.4*   No results for input(s): LIPASE, AMYLASE in the last 168 hours. CBC: Recent Labs  Lab 05/05/20 0058 05/06/20 0327 05/07/20 1633 05/08/20 0621 05/10/20 1426  WBC 8.0 8.0 8.6 9.3 7.1  HGB 7.6* 7.2* 7.0* 8.5* 8.2*  HCT 23.6* 22.6* 23.1* 28.1* 27.5*  MCV 80.0 81.3 84.0 84.9 86.8  PLT 211 186 204 174 183   Blood Culture    Component Value Date/Time   SDES BLOOD SITE NOT SPECIFIED 04/29/2020 1210   SPECREQUEST  04/29/2020 1210    BOTTLES DRAWN AEROBIC AND ANAEROBIC Blood Culture adequate volume   CULT  04/29/2020 1210    NO GROWTH 5 DAYS Performed at [facility] [address] [place], [place] [address]     REPTSTATUS 05/04/2020 FINAL 04/29/2020 1210    Cardiac Enzymes: No results for input(s): CKTOTAL, CKMB, CKMBINDEX, TROPONINI in the last 168 hours. CBG: Recent Labs  Lab 05/10/20 1128 05/10/20 1648 05/10/20 2043 05/11/20 0645 05/11/20 1114  GLUCAP 142* 149* 154* 125* 182*   Iron Studies: No results for input(s): IRON, TIBC, TRANSFERRIN, FERRITIN in the last 72 hours. @lablastinr3 @ Studies/Results: No results found. Medications: . ceFEPime (MAXIPIME) IV 1 g (05/11/20 0046)  . vancomycin 1,000 mg (05/10/20 1626)   . (feeding supplement) PROSource Plus  30 mL Oral BID BM  . amLODipine  10 mg Oral Daily  . Chlorhexidine Gluconate Cloth  6 each Topical Q0600  . collagenase   Topical Daily  . famotidine  20 mg Oral QHS  . feeding supplement (NEPRO CARB STEADY)  237 mL Oral BID BM  . heparin injection (subcutaneous)  5,000 Units Subcutaneous Q8H  . insulin aspart  0-5 Units Subcutaneous QHS  . insulin aspart  0-9 Units Subcutaneous TID WC  . insulin glargine  10 Units Subcutaneous QHS  . lidocaine-EPINEPHrine  20 mL  Infiltration Once  . metoCLOPramide  5 mg Oral TID AC  . metoprolol tartrate  50 mg Oral BID  . multivitamin  1 tablet Oral QHS  . senna  1 tablet Oral BID  . sodium chloride flush  3 mL Intravenous Q12H     OP HD:  NWMWF (on COVID shift) , was TTS 4h 400/800 143.5kg 3K/2.5Ca TDC  -Heparin 3000 units IV TIW -Mircera152mcg IV q2wks - last 12/8 -Venofer 100mg  IV qHD x10 - completed 6/10 doses  Assessment/ Plan: 1. Sacral decub/ coccygealosteo-per ID cont vanc/ maxipime, D#9. Continues hydrotherapy. 6 wks total IV abx thru 06/10/20 per ID. 2. Osteomyelitis L foot/ pressure injury bilat heels -per ID cont vanc/ maxipime. PT/OT recommending CIR. 3. COVID PNA- tested positive on 11/30, is off isolation now. 4. ESRD-Schedule changed for COVID. Will have HD tomorrow, then short HD 11/23 to return to TTS schedule.  5. Hypertension/volume-HD  12/20 Net UF 3.0 L. Post wt 155.1 kg. Na 133. Still believe he has more volume to be removed. UF 3-4 liters with HD tomorrow.  6. Anemiaof CKD- Hgb 8.2. Start weekly Aranesp 100 mcg IV tomorrow.  7. Secondary Hyperparathyroidism -CCa in goal. Phos improved to 5.1 S/P supplementation. Not on VDRA or binders.  8. Nutrition-alb 1.5  Change to renal/Carb mod diet with fluid restrictions as K+ 5.0. Continue prosourceand Nepro supplement  9. DM T 2 - per admit  Chey Rachels H. Oriah Leinweber NP-C 05/11/2020, 12:35 PM  [employer] [phone]

## 2020-05-11 NOTE — Progress Notes (Signed)
Subjective:  Patient evaluated at bedside this AM. Denies any leg pain today. States his breathing is doing okay. He has been eating a little and last BM was yesterday, denies any abd pain. States they put some "brown stuff" on wounds and it dried it out. He also states the Prevalon boot was brought to the room, but not put on his feet. He has not heard from inpatient rehab team today.   Objective:  Vital signs in last 24 hours: Vitals:   05/10/20 1730 05/10/20 1739 05/10/20 1926 05/11/20 0439  BP: (!) 162/65 (!) 150/74 (!) 162/81 (!) 146/76  Pulse: (!) 102 (!) 101 97 92  Resp:  20 18 18   Temp:  (!) 97.5 F (36.4 C) 98.2 F (36.8 C) 98.1 F (36.7 C)  TempSrc:  Oral Oral Oral  SpO2:   98% 97%  Weight:  (!) 155.1 kg    Height:       Physical Exam: General: Pleasant, laying in bed, no acute distress CV: Regular rate, rhythm. No murmurs, rubs, gallops Abdomen: Soft, non-tender, non-distended MSK: Unstageable pressure wounds on left lateral foot, left lateral malleolus, right lateral foot, right heel.  Assessment/Plan: Fred [name] is 49yo male with ESRD, hypertension, insulin-dependent type II diabetes mellitus admitted 04/29/20 for sacral decubitus ulcer and COVID-19 pneumonia, continuing to progress well on IV antibiotics, awaiting CIR bed placement.  Principal Problem:   Sacral osteomyelitis ([place]) Active Problems:   Insulin dependent type 2 diabetes mellitus (HCC)   HTN (hypertension)   History of anemia due to chronic kidney disease   Pressure injury of both heels, unstageable ([place])   Pneumonia due to COVID-19 virus   ESRD (end stage renal disease) (HCC)   Avascular necrosis of first metatarsal, left foot (HCC)   Osteomyelitis of dorsal first metatarsal, left foot (HCC)   Decubitus ulcer of sacral region, unstageable ([place])   Morbid obesity (HCC)  #Unstageable sacral decubitus ulcer #Coccygeal osteomyelitis Plan to continue with conservative management at this time. Plan  for IV antibiotics and off-loading sacral wounds as much as possible. CIR following for admission when a bed becomes available. 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 During rounds this AM, pressure dressings not applied and Prevalon boots not placed. Will discuss with RN to make sure both have been placed today. - C/w broad-spectrum abx - C/w pressure dressings, Prevalon boots  #ESRD on HD Nephrology following, appreciate recommendations. Plan for HD tomorrow then short HD the following day to return to regular schedule. - HD tomorrow, then short HD on 12/23 - Return to TTS schedule  #Type II diabetes mellitus AM CBG 125. Otherwise, sugars overall under control <150. Will continue with current regimen. - Lantus 10u QHS - SSI w/ meals, QHS  #Hypertension BP continues to be slightly elevated, SBP 140-160 overnight. HR in 80's. Will increase metoprolol to home dosage. - Increase metoprolol 50mg  BID  #Deconditioning #Generalized weakness Patient continuing to work with PT/OT. CIR following for when bed is available. - PT/OT - CIR when bed available.  DIET: CM IVF: n/a DVT PPX: subQ heparin BOWEL: [place]ot CODE: FULL FAM COM:  Prior to Admission Living Arrangement: SNF Anticipated Discharge Location: CIR  Barriers to Discharge: CIR bed availability Dispo: Anticipated discharge in approximately 1-2 day(s).   Sanjuan Dame, MD 05/11/2020, 6:21 AM Pager: [phone] After 5pm on weekdays and 1pm on weekends: On Call pager [phone]

## 2020-05-11 NOTE — Progress Notes (Signed)
Physical Therapy Wound Treatment Patient Details  Name: Eriq Hufford MRN: [id number] Date of Birth: 10-30-1970  Today's Date: 05/11/2020 Time: 1345-1430 Time Calculation (min): 45 min  Subjective  Subjective: Agreeable to hydrotherapy. Patient and Family Stated Goals: I really want to get healed up. Date of Onset:  (prior to admission) Prior Treatments: Dressing changes  Pain Score:  No pain reported throughout treatment.   Wound Assessment  Pressure Injury 05/01/20 Buttocks Left Stage 4 - Full thickness tissue loss with exposed bone, tendon or muscle. packed with santyl gauze, dressed by WOCN, (Active)  Wound Image   05/11/20 1442  Dressing Type ABD;Barrier Film (skin prep);Gauze (Comment);Moist to dry 05/11/20 1442  Dressing Changed;Clean;Dry;Intact 05/11/20 1442  Dressing Change Frequency Daily 05/11/20 1442  State of Healing Early/partial granulation 05/11/20 1442  Site / Wound Assessment Yellow;Pink 05/11/20 1442  % Wound base Red or Granulating 45% 05/11/20 1442  % Wound base Yellow/Fibrinous Exudate 55% 05/11/20 1442  % Wound base Black/Eschar 0% 05/11/20 1442  % Wound base Other/Granulation Tissue (Comment) 0% 05/11/20 1442  Peri-wound Assessment Intact;Pink 05/11/20 1442  Wound Length (cm) 6 cm 05/11/20 1400  Wound Width (cm) 6.5 cm 05/11/20 1400  Wound Depth (cm) 3.8 cm 05/11/20 1400  Wound Surface Area (cm^2) 39 cm^2 05/11/20 1400  Wound Volume (cm^3) 148.2 cm^3 05/11/20 1400  Tunneling (cm) 0 05/11/20 1442  Undermining (cm) 5.5 cm at 11-12:00, 6.1 cm at 9:00, 5 cm at 8:00, 05/11/20 1400  Margins Unattached edges (unapproximated) 05/11/20 1442  Drainage Amount Moderate (difficult to assess as pt spilled urinal and dressing was soaked on arrival) 05/11/20 1442  Drainage Description Serosanguineous 05/11/20 1442  Treatment Debridement (Selective);Hydrotherapy (Pulse lavage);Packing (Saline gauze) 05/11/20 1442   Santyl applied to wound bed prior to applying dressing.      Hydrotherapy Pulsed lavage therapy - wound location: L buttock wound Pulsed Lavage with Suction (psi): 12 psi Pulsed Lavage with Suction - Normal Saline Used: 1000 mL Pulsed Lavage Tip: Tip with splash shield Selective Debridement Selective Debridement - Location: buttock wound Selective Debridement - Tools Used: Forceps;Scissors Selective Debridement - Tissue Removed: yellow unviable tissue   Wound Assessment and Plan  Wound Therapy - Assess/Plan/Recommendations Wound Therapy - Clinical Statement: Progressing slowly with conservative debridement of yellow slough. Wound bed appearance improving. Hydrotherapy will continue to follow for selective removal of unviable tissue, to decrease bioburden and promote wound bed healing. Wound Therapy - Functional Problem List: decreased mobility Factors Delaying/Impairing Wound Healing: Diabetes Mellitus;Altered sensation;Immobility Hydrotherapy Plan: Debridement;Dressing change;Patient/family education;Pulsatile lavage with suction Wound Therapy - Frequency: 6X / week Wound Therapy - Current Recommendations: PT Wound Therapy - Follow Up Recommendations: Skilled nursing facility Wound Plan: see above  Wound Therapy Goals- Improve the function of patient's integumentary system by progressing the wound(s) through the phases of wound healing (inflammation - proliferation - remodeling) by: Decrease Necrotic Tissue to: 15% Decrease Necrotic Tissue - Progress: Progressing toward goal Increase Granulation Tissue to: 85% Increase Granulation Tissue - Progress: Progressing toward goal Improve Drainage Characteristics: Min;Serous Improve Drainage Characteristics - Progress: Progressing toward goal Goals/treatment plan/discharge plan were made with and agreed upon by patient/family: Yes Time For Goal Achievement: 7 days Wound Therapy - Potential for Goals: Good  Goals will be updated until maximal potential achieved or discharge criteria met.   Discharge criteria: when goals achieved, discharge from hospital, MD decision/surgical intervention, no progress towards goals, refusal/missing three consecutive treatments without notification or medical reason.  GP     [name] 05/11/2020,  2:47 PM   [name], PT, DPT Acute Rehabilitation Services Pager: [phone] Office: [phone]

## 2020-05-11 NOTE — Progress Notes (Signed)
OT Cancellation Note  Patient Details Name: Fred [name] MRN: [id number] DOB: September 02, 1970   Cancelled Treatment:    Reason Eval/Treat Not Completed: Other (comment). PT with pt completing hydrotherapy. Will follow-up again for OT session as time allows.   [name] 05/11/2020, 2:10 PM

## 2020-05-11 NOTE — Progress Notes (Signed)
Physical Therapy Treatment Patient Details Name: Fred [name] MRN: [id number] DOB: 1970/09/03 Today's Date: 05/11/2020    History of Present Illness Pt is a 49 y.o. male admitted from [facility] SN[facility] on 04/29/20 for evaluation of low oxygen saturations 82% on room air; concern for sepsis form Covid PNA, sacral wound and foot wounds. 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 Of note, pt recently admitted to [facility] due to gallbladder problems then d/c to [facility](14 days) for rehab then readmitted to [facility] then d/c to [facility] for rehab. PMH includes ESRD on HD (started 9/21), HTN, DM, C. diff.    PT Comments    Pt sidelying to L on arrival, agreeable to therapy session and with good participation and tolerance for mobility. Primary session focus on progressing seated tolerance and exercises for strengthening. Pt performed bed mobility with HOB fully raised via modified log roll with modA and able to sit unsupported with Supervision. Pt performed semi-sidelying and seated BLE AROM therapeutic exercises with good tolerance as detailed below, pt with difficulty reaching terminal knee extension bilaterally (at least 15-20 deg quad lag when attempting). Deferred standing due to lack of +2 assist and pt reporting moderate fatigue (5/10 on modified RPE) at end of session. Pt continues to benefit from skilled rehab in a moderate to high intensity post acute setting to maximize functional gains before returning home. Orthostatic Bps (taken in LUE)  Supine 157/94 (113)  Sitting 124/97 (107) (pt denies dizziness this date, c/o feeling tired)  Sitting after 5 min 156/100 (114)  Standing - deferred ---  Supine 159/103 (118)      Follow Up Recommendations  CIR;Supervision for mobility/OOB     Equipment Recommendations  Wheelchair (measurements PT);Wheelchair cushion (measurements PT);Hospital bed;Other (comment)     Recommendations for Other Services       Precautions / Restrictions Precautions Precautions: Fall Precaution Comments: Multiple wounds; (+) orthostatic hypotension with syncope 12/16 Required Braces or Orthoses: Other Brace Other Brace: B prevalon boots (new boots noted to be in closet and assisted pt to don during session) Restrictions Weight Bearing Restrictions: No    Mobility  Bed Mobility Overal bed mobility: Needs Assistance Bed Mobility: Rolling;Sidelying to Sit;Sit to Sidelying Rolling: Min assist Sidelying to sit: Mod assist;HOB elevated     Sit to sidelying: Mod assist;HOB elevated General bed mobility comments: head of bed fully elevated prior to attempting sidelying to sit transition due to lack of +2 assist, pt did well with this technique and able to assist with BLE clearance from EOB and trunk rise; modA for trunk steadying  Transfers                    Ambulation/Gait                 Stairs             Wheelchair Mobility    Modified Rankin (Stroke Patients Only)       Balance Overall balance assessment: Needs assistance Sitting-balance support: No upper extremity supported;Bilateral upper extremity supported;Single extremity supported Sitting balance-Leahy Scale: Fair Sitting balance - Comments: pt able to perform lateral leans with elbow taps and min guard, able to reach 1-2" outside BOS laterally and able to flex BUE to shoulder height without LOB; posterior lean during seated therex/LAQ Postural control: Posterior lean  Cognition Arousal/Alertness: Awake/alert Behavior During Therapy: WFL for tasks assessed/performed;Flat affect Overall Cognitive Status: No family/caregiver present to determine baseline cognitive functioning Area of Impairment: Following commands;Safety/judgement;Awareness;Problem solving;Attention                   Current Attention Level:  Selective Memory: Decreased short-term memory Following Commands: Follows one step commands consistently;Follows multi-step commands inconsistently Safety/Judgement: Decreased awareness of safety;Decreased awareness of deficits Awareness: Emergent Problem Solving: Slow processing;Requires verbal cues General Comments: pt with flat affect and slow processing, some word finding difficulty at times. Requires increased time and cues to process basic tasks.      Exercises General Exercises - Lower Extremity Ankle Circles/Pumps: AROM;Strengthening;Both;10 reps;Seated;Supine Long [employer]: AROM;Strengthening;Both;20 reps;Seated (2x10 reps) Heel Slides: AROM;Strengthening;Both;10 reps;Sidelying (semi-sidelying due to sacral wound) Hip Flexion/Marching: AROM;Strengthening;Both;20 reps;Seated (2x10 reps ea) Other Exercises Other Exercises: lateral leans with elbow taps, shoulder flexion B x5 reps ea    General Comments General comments (skin integrity, edema, etc.): Pt did not c/o dizziness with EOB transfer this date, but BP did drop initially (see orthostatic BP in comments)      Pertinent Vitals/Pain Pain Assessment: Faces Faces Pain Scale: Hurts a little bit Pain Location: Generalized Pain Descriptors / Indicators: Discomfort Pain Intervention(s): Monitored during session;Repositioned    Home Living                      Prior Function            PT Goals (current goals can now be found in the care plan section) Acute Rehab PT Goals Patient Stated Goal: Heal wounds; be able to walk outside PT Goal Formulation: With patient Time For Goal Achievement: 05/17/20 Potential to Achieve Goals: Good Progress towards PT goals: Progressing toward goals    Frequency    Min 3X/week      PT Plan Current plan remains appropriate    Co-evaluation              AM-PAC PT "6 Clicks" Mobility   Outcome Measure  Help needed turning from your back to your side while in a  flat bed without using bedrails?: A Little Help needed moving from lying on your back to sitting on the side of a flat bed without using bedrails?: A Lot Help needed moving to and from a bed to a chair (including a wheelchair)?: Total Help needed standing up from a chair using your arms (e.g., wheelchair or bedside chair)?: Total Help needed to walk in hospital room?: Total Help needed climbing 3-5 steps with a railing? : Total 6 Click Score: 9    End of Session   Activity Tolerance: Patient tolerated treatment well Patient left: in bed;Other (comment);with call bell/phone within reach (prevalon boots donned) Nurse Communication: Mobility status;Need for lift equipment PT Visit Diagnosis: Muscle weakness (generalized) (M62.81);Difficulty in walking, not elsewhere classified (R26.2)     Time: 1443-1540 PT Time Calculation (min) (ACUTE ONLY): 32 min  Charges:  $Therapeutic Exercise: 8-22 mins $Therapeutic Activity: 8-22 mins                     Karli Wickizer P., PTA Acute Rehabilitation Services Pager: [phone] Office: [place] 05/11/2020, 5:17 PM

## 2020-05-12 LAB — CBC
HCT: 26.6 % — ABNORMAL LOW (ref 39.0–52.0)
Hemoglobin: 8.1 g/dL — ABNORMAL LOW (ref 13.0–17.0)
MCH: 26.4 pg (ref 26.0–34.0)
MCHC: 30.5 g/dL (ref 30.0–36.0)
MCV: 86.6 fL (ref 80.0–100.0)
Platelets: 194 10*3/uL (ref 150–400)
RBC: 3.07 MIL/uL — ABNORMAL LOW (ref 4.22–5.81)
RDW: 18.5 % — ABNORMAL HIGH (ref 11.5–15.5)
WBC: 7.3 10*3/uL (ref 4.0–10.5)
nRBC: 0 % (ref 0.0–0.2)

## 2020-05-12 LAB — RENAL FUNCTION PANEL
Albumin: 1.4 g/dL — ABNORMAL LOW (ref 3.5–5.0)
Anion gap: 8 (ref 5–15)
BUN: 34 mg/dL — ABNORMAL HIGH (ref 6–20)
CO2: 27 mmol/L (ref 22–32)
Calcium: 7.8 mg/dL — ABNORMAL LOW (ref 8.9–10.3)
Chloride: 98 mmol/L (ref 98–111)
Creatinine, Ser: 4.41 mg/dL — ABNORMAL HIGH (ref 0.61–1.24)
GFR, Estimated: 16 mL/min — ABNORMAL LOW (ref >60)
Glucose, Bld: 137 mg/dL — ABNORMAL HIGH (ref 70–99)
Phosphorus: 4.1 mg/dL (ref 2.5–4.6)
Potassium: 4.6 mmol/L (ref 3.5–5.1)
Sodium: 133 mmol/L — ABNORMAL LOW (ref 135–145)

## 2020-05-12 LAB — GLUCOSE, CAPILLARY
Glucose-Capillary: 137 mg/dL — ABNORMAL HIGH (ref 70–99)
Glucose-Capillary: 126 mg/dL — ABNORMAL HIGH (ref 70–99)
Glucose-Capillary: 245 mg/dL — ABNORMAL HIGH (ref 70–99)

## 2020-05-12 MED ORDER — DARBEPOETIN ALFA 100 MCG/0.5ML IJ SOSY
PREFILLED_SYRINGE | INTRAMUSCULAR | Status: AC
  Administered 2020-05-12: 100 ug
  Filled 2020-05-12: qty 0.5

## 2020-05-12 MED ORDER — PENTAFLUOROPROP-TETRAFLUOROETH EX AERO: 1.0000 "application " | INHALATION_SPRAY | CUTANEOUS | Status: DC | PRN

## 2020-05-12 MED ORDER — HEPARIN SODIUM (PORCINE) 1000 UNIT/ML DIALYSIS
3000.0000 [IU] | Freq: Once | INTRAMUSCULAR | Status: AC
  Administered 2020-05-12: 3000 [IU] via INTRAVENOUS_CENTRAL

## 2020-05-12 MED ORDER — HEPARIN SODIUM (PORCINE) 1000 UNIT/ML DIALYSIS: 1000.0000 [IU] | INTRAMUSCULAR | Status: DC | PRN

## 2020-05-12 MED ORDER — SODIUM CHLORIDE 0.9 % IV SOLN: 100.0000 mL | INTRAVENOUS | Status: DC | PRN

## 2020-05-12 MED ORDER — HEPARIN SODIUM (PORCINE) 1000 UNIT/ML IJ SOLN
INTRAMUSCULAR | Status: AC
  Filled 2020-05-12: qty 3

## 2020-05-12 MED ORDER — VANCOMYCIN HCL IN DEXTROSE 1-5 GM/200ML-% IV SOLN
INTRAVENOUS | Status: AC
  Filled 2020-05-12: qty 200

## 2020-05-12 MED ORDER — LIDOCAINE-PRILOCAINE 2.5-2.5 % EX CREA: 1.0000 "application " | TOPICAL_CREAM | CUTANEOUS | Status: DC | PRN

## 2020-05-12 MED ORDER — HEPARIN SODIUM (PORCINE) 1000 UNIT/ML DIALYSIS: 3000.0000 [IU] | Freq: Once | INTRAMUSCULAR | Status: DC

## 2020-05-12 MED ORDER — LIDOCAINE HCL (PF) 1 % IJ SOLN: 5.0000 mL | INTRAMUSCULAR | Status: DC | PRN

## 2020-05-12 MED ORDER — HEPARIN SODIUM (PORCINE) 1000 UNIT/ML IJ SOLN
INTRAMUSCULAR | Status: AC
  Filled 2020-05-12: qty 4

## 2020-05-12 MED ORDER — ALTEPLASE 2 MG IJ SOLR: 2.0000 mg | Freq: Once | INTRAMUSCULAR | Status: DC | PRN

## 2020-05-12 NOTE — Progress Notes (Signed)
   Subjective:  Patient was evaluated at bedside this AM during HD. He mentions he is cold during dialysis but otherwise no complaints. States he has been wearing the Prevalon boots. Denies chest pain, dyspnea.  Objective:  Vital signs in last 24 hours: Vitals:   05/11/20 0824 05/11/20 1056 05/11/20 1352 05/11/20 2018  BP: 134/81  132/80 138/69  Pulse: 97  82 (!) 101  Resp: 17  17 18   Temp: 99.1 F (37.3 C)  98.7 F (37.1 C) 98.3 F (36.8 C)  TempSrc: Oral  Oral   SpO2: 99% 99%  95%  Weight:      Height:       Physical Exam: General: Laying in bed, no acute distress CV: Regular rate, rhythm. No murmurs, rubs, gallops. Feet: Bilateral feet wrapped with gauze. L lateral foot wound with non-odorous fibrinous exudate  Assessment/Plan: Fred [name] is 49yo male with ESRD, hypertension, insulin-depending type II diabetes mellitus admitted 04/29/20 for sacral decubitus ulcer and COVID-19 pneumonia, continuing to progress well on IV antibiotics, awaiting CIR bed placement.   Principal Problem:   Sacral osteomyelitis ([place]) Active Problems:   Insulin dependent type 2 diabetes mellitus (HCC)   HTN (hypertension)   History of anemia due to chronic kidney disease   Pressure injury of both heels, unstageable ([place])   Pneumonia due to COVID-19 virus   ESRD (end stage renal disease) (HCC)   Avascular necrosis of first metatarsal, left foot (HCC)   Osteomyelitis of dorsal first metatarsal, left foot (HCC)   Decubitus ulcer of sacral region, unstageable ([place])   Morbid obesity (HCC)  #Unstageable sacral decubitus ulcer #Coccygeal osteomyelitis Continue with long-term antibiotics for osteomyelitis. Will continue wound care per WOCN. Awaiting bed placement per CIR - C/w vancomycin, cefepime (end date 06/10/20) - Air mattress, off-loading sacral wound - Wound care per WOCN - PT/OT - CIR once bed available  #Unstageable pressure wounds (L lateral foot, lateral malleolus; R lateral foot, R  heel) #Dry gangrene L 4th toe #Osteomyelitis L first metatarsal, R 5th metatarsal This AM, patient with feet wrapped in gauze bilaterally. Patient reports he has been wearing Prevalon boots. L lateral foot wound with fibrinous exudate. Will re-contact WOCN to re-assess wounds for recommendations - C/w antibiotics - C/w dressings, Prevalon boots - Discuss w/ WOCN  #ESRD on HD Nephrology following, appreciate recs. Patient undergoing HD today and will undergo short HD tomorrow to return to TTS schedule. - HD today, short HD tomorrow  #Type II diabetes mellitus AM CBG 137. Continues to be controlled with regimen. No changes at this time. - C/w Lantus 10u, SSI - CBG monitoring TID, QHS  #Hypertension BP overnight much improved w/ SBP in 130's. Will continue on current home regimen. - C/w metoprolol tartrate 25mg  BID, amlodipine 10mg  qd  #Deconditioning #Generalized weakness Patient to continue working with PT/OT, waiting for bed placement with CIR. - PT/OT - Pending CIR placement  DIET: CM IVF: n/a DVT PPX: subQ heparin BOWEL: [place]ot CODE: FULL FAM COM: n/a  Prior to Admission Living Arrangement: SNF Anticipated Discharge Location: CIR Barriers to Discharge: CIR bed availability Dispo: Anticipated discharge in approximately 1-2 day(s).   Sanjuan Dame, MD 05/12/2020, 6:03 AM Pager: [phone] After 5pm on weekdays and 1pm on weekends: On Call pager [phone]

## 2020-05-12 NOTE — Progress Notes (Signed)
PT Cancellation Note  Patient Details Name: Fred [name] MRN: [id number] DOB: 1970/06/07   Cancelled Treatment:    Reason Eval/Treat Not Completed: (P) Patient at procedure or test/unavailable (pt at HD dept.) Will reattempt later in day as schedule permits per PT POC.   Kara Pacer Daiya Tamer 05/12/2020, 9:55 AM

## 2020-05-12 NOTE — Progress Notes (Signed)
   05/12/20 1100  OT Visit Information  Last OT Received On 05/12/20  Assistance Needed +2  Reason Eval/Treat Not Completed Other (comment) (HD)  History of Present Illness Pt is a 49 y.o. male admitted from [facility] on 04/29/20 for evaluation of low oxygen saturations 82% on room air; concern for sepsis form Covid PNA, sacral wound and foot wounds. 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 Of note, pt recently admitted to [facility] due to gallbladder problems then d/c to [facility](14 days) for rehab then readmitted to [facility] then d/c to [facility] for rehab. PMH includes ESRD on HD (started 9/21), HTN, DM, C. diff.   Tyrone Schimke, OT Acute Rehabilitation Services Pager: [phone] Office: [phone]

## 2020-05-12 NOTE — Progress Notes (Signed)
[facility] Progress Note   Subjective: Seen on HD. No C/Os. Tolerating well.     Objective Vitals:   05/12/20 0902 05/12/20 0907 05/12/20 0912 05/12/20 0918  BP:    (!) 141/93  Pulse:      Resp: 17 14 16 12   Temp:      TempSrc:      SpO2:      Weight:      Height:       Physical Exam General:Chronically ill appearing male in NAD Heart:S1,S2 RRR No M/G/R Lungs:slightly decreased in bases otherwise CTAB Abdomen:obese active BS, NT Extremities:Trace BLE edema R > L. Ulcers in various stages of healing on feet. Dialysis Access:RIJ TDC drsg intact blood lines connected   Additional Objective Labs: Basic Metabolic Panel: Recent Labs  Lab 05/07/20 1633 05/08/20 0621 05/10/20 1426  NA 136 137 133*  K 4.0 4.0 5.0  CL 99 101 99  CO2 28 27 25   GLUCOSE 219* 167* 225*  BUN 34* 18 39*  CREATININE 5.10* 3.42* 4.93*  CALCIUM 7.4* 7.8* 8.0*  PHOS 3.3  --  5.1*   Liver Function Tests: Recent Labs  Lab 05/07/20 1633 05/08/20 0621 05/10/20 1426  AST  --  18  --   ALT  --  18  --   ALKPHOS  --  274*  --   BILITOT  --  0.6  --   PROT  --  5.6*  --   ALBUMIN 1.4* 1.5* 1.4*   No results for input(s): LIPASE, AMYLASE in the last 168 hours. CBC: Recent Labs  Lab 05/06/20 0327 05/07/20 1633 05/08/20 0621 05/10/20 1426 05/12/20 0920  WBC 8.0 8.6 9.3 7.1 7.3  HGB 7.2* 7.0* 8.5* 8.2* 8.1*  HCT 22.6* 23.1* 28.1* 27.5* 26.6*  MCV 81.3 84.0 84.9 86.8 86.6  PLT 186 204 174 183 194   Blood Culture    Component Value Date/Time   SDES BLOOD SITE NOT SPECIFIED 04/29/2020 1210   SPECREQUEST  04/29/2020 1210    BOTTLES DRAWN AEROBIC AND ANAEROBIC Blood Culture adequate volume   CULT  04/29/2020 1210    NO GROWTH 5 DAYS Performed at [facility] [address], [place], [place] [address]    REPTSTATUS 05/04/2020 FINAL 04/29/2020 1210    Cardiac Enzymes: No results for input(s): CKTOTAL, CKMB, CKMBINDEX, TROPONINI in the last 168  hours. CBG: Recent Labs  Lab 05/11/20 0645 05/11/20 1114 05/11/20 1629 05/11/20 2015 05/12/20 0639  GLUCAP 125* 182* 198* 149* 137*   Iron Studies: No results for input(s): IRON, TIBC, TRANSFERRIN, FERRITIN in the last 72 hours. @lablastinr3 @ Studies/Results: No results found. Medications:  sodium chloride     sodium chloride     ceFEPime (MAXIPIME) IV 1 g (05/11/20 2109)   vancomycin 1,000 mg (05/10/20 1626)    (feeding supplement) PROSource Plus  30 mL Oral BID BM   amLODipine  10 mg Oral Daily   Chlorhexidine Gluconate Cloth  6 each Topical Q0600   collagenase   Topical BID   darbepoetin (ARANESP) injection - DIALYSIS  100 mcg Intravenous Q Wed-HD   famotidine  20 mg Oral QHS   feeding supplement (NEPRO CARB STEADY)  237 mL Oral BID BM   [START ON 05/13/2020] heparin  3,000 Units Dialysis Once in dialysis   heparin injection (subcutaneous)  5,000 Units Subcutaneous Q8H   heparin sodium (porcine)       insulin aspart  0-5 Units Subcutaneous QHS   insulin aspart  0-9  Units Subcutaneous TID WC   insulin glargine  10 Units Subcutaneous QHS   lidocaine-EPINEPHrine  20 mL Infiltration Once   metoCLOPramide  5 mg Oral TID AC   metoprolol tartrate  50 mg Oral BID   multivitamin  1 tablet Oral QHS   senna  1 tablet Oral BID   sodium chloride flush  3 mL Intravenous Q12H     OP BO:MQTTC (on COVID shift) , was TTS 4h 400/800 143.5kg 3K/2.5Ca TDC -Heparin3000units IV TIW -Mircera144mcgIVq2wks - last 12/8 -Venofer 100mg IVqHD x10 - completed 6/10 doses  Assessment/ Plan: 1. Sacral decub/ coccygealosteo-per ID cont vanc/ maxipime, D#9. Continues hydrotherapy. 6 wks total IV abx thru 06/10/20 per ID. 2. Osteomyelitis L foot/ pressure injury bilat heels -per ID cont vanc/ maxipime. PT/OT recommending CIR. 3. COVID PNA- tested positive on 11/30, is off isolation now. 4. ESRD-Schedule changed for COVID. Will have HD tomorrow,  then short HD 11/23 to return to TTS schedule.  5. Hypertension/volume-Attempting to lower volume today. Attempting 4 liters today. BP stable.  6. Anemiaof CKD- Hgb 8.1. Start weekly Aranesp 100 mcg IV today. Change to T,Th,S schedule for next week.  7. Secondary Hyperparathyroidism -CCa in goal. Phos improved to 5.1 S/P supplementation. Not on VDRA or binders. Labs pending.  8. Nutrition-alb 1.5  Change to renal/Carb mod diet with fluid restrictions as K+ 5.0. Continue prosourceand Nepro supplement  9. DM T 2 - per admit   Add Dinapoli H. Reuven Braver NP-C 05/12/2020, 9:44 AM  [employer] [phone]

## 2020-05-12 NOTE — Progress Notes (Signed)
Inpatient Rehab Admissions Coordinator:   I have no beds available for this patient to admit to CIR today.  Will continue to follow for timing of potential admission pending bed availability.   Shann Medal, PT, DPT Admissions Coordinator [phone] 05/12/20  10:57 AM

## 2020-05-12 NOTE — Progress Notes (Signed)
Physical Therapy Wound Treatment [name] MRN: [id number] Date of Birth: 1970/07/08  Today's Date: 05/12/2020 Time: 4401-0272 Time Calculation (min): 27 min  Subjective  Subjective: Agreeable to hydrotherapy. Patient and Family Stated Goals: I really want to get healed up. Date of Onset:  (prior to admission) Prior Treatments: Dressing changes  Pain Score:0/10  Wound Assessment  Pressure Injury 05/01/20 Buttocks Left Stage 4 - Full thickness tissue loss with exposed bone, tendon or muscle. packed with santyl gauze, dressed by WOCN, (Active)  Dressing Type ABD;Barrier Film (skin prep);Gauze (Comment);Moist to moist 05/12/20 1410  Dressing Changed;Clean;Intact;Dry 05/12/20 1410  Dressing Change Frequency Daily 05/12/20 1410  State of Healing Early/partial granulation 05/12/20 1410  Site / Wound Assessment Yellow;Pink 05/12/20 1410  % Wound base Red or Granulating 45% 05/11/20 1442  % Wound base Yellow/Fibrinous Exudate 55% 05/11/20 1442  % Wound base Black/Eschar 0% 05/11/20 1442  % Wound base Other/Granulation Tissue (Comment) 0% 05/11/20 1442  Peri-wound Assessment Intact;Pink 05/12/20 1410  Wound Length (cm) 6 cm 05/11/20 1400  Wound Width (cm) 6.5 cm 05/11/20 1400  Wound Depth (cm) 3.8 cm 05/11/20 1400  Wound Surface Area (cm^2) 39 cm^2 05/11/20 1400  Wound Volume (cm^3) 148.2 cm^3 05/11/20 1400  Tunneling (cm) 0 05/11/20 1442  Undermining (cm) 5.5 cm at 11-12:00, 6.1 cm at 9:00, 5 cm at 8:00, 05/11/20 1400  Margins Unattached edges (unapproximated) 05/12/20 1410  Drainage Amount Moderate 05/12/20 1410  Drainage Description Serosanguineous 05/12/20 1410  Treatment Debridement (Selective);Hydrotherapy (Pulse lavage);Packing (Saline gauze) 05/12/20 1410   Santyl applied to wound bed prior to applying dressing.    Hydrotherapy Pulsed lavage therapy - wound location: L buttock wound Pulsed Lavage with Suction (psi): 12 psi Pulsed Lavage with  Suction - Normal Saline Used: 1000 mL Pulsed Lavage Tip: Tip with splash shield Selective Debridement Selective Debridement - Location: buttock wound Selective Debridement - Tools Used: Forceps;Scissors Selective Debridement - Tissue Removed: yellow unviable tissue   Wound Assessment and Plan  Wound Therapy - Assess/Plan/Recommendations Wound Therapy - Clinical Statement: Progressing slowly with conservative debridement of yellow slough; continued sanguineous drainage with sharp debridement. Wound bed with early/partial granulation. Hydrotherapy will continue to follow for selective removal of unviable tissue, to decrease bioburden and promote wound bed healing. Wound Therapy - Functional Problem List: decreased mobility Factors Delaying/Impairing Wound Healing: Diabetes Mellitus;Altered sensation;Immobility Hydrotherapy Plan: Debridement;Dressing change;Patient/family education;Pulsatile lavage with suction Wound Therapy - Frequency: 6X / week Wound Therapy - Current Recommendations: PT Wound Therapy - Follow Up Recommendations: Skilled nursing facility Wound Plan: see above  Wound Therapy Goals- Improve the function of patient's integumentary system by progressing the wound(s) through the phases of wound healing (inflammation - proliferation - remodeling) by: Decrease Necrotic Tissue to: 15% Increase Granulation Tissue to: 85% Improve Drainage Characteristics: Min;Serous Goals/treatment plan/discharge plan were made with and agreed upon by patient/family: Yes Time For Goal Achievement: 7 days Wound Therapy - Potential for Goals: Good  Goals will be updated until maximal potential achieved or discharge criteria met.  Discharge criteria: when goals achieved, discharge from hospital, MD decision/surgical intervention, no progress towards goals, refusal/missing three consecutive treatments without notification or medical reason.  GP    Fred [name], PT, DPT Acute Rehabilitation  Services Pager [phone] Office [phone]   Fred [name] 05/12/2020, 2:12 PM

## 2020-05-13 LAB — CBC
HCT: 25.9 % — ABNORMAL LOW (ref 39.0–52.0)
Hemoglobin: 7.7 g/dL — ABNORMAL LOW (ref 13.0–17.0)
MCH: 26.1 pg (ref 26.0–34.0)
MCHC: 29.7 g/dL — ABNORMAL LOW (ref 30.0–36.0)
MCV: 87.8 fL (ref 80.0–100.0)
Platelets: 175 10*3/uL (ref 150–400)
RBC: 2.95 MIL/uL — ABNORMAL LOW (ref 4.22–5.81)
RDW: 18.6 % — ABNORMAL HIGH (ref 11.5–15.5)
WBC: 6.7 10*3/uL (ref 4.0–10.5)
nRBC: 0 % (ref 0.0–0.2)

## 2020-05-13 LAB — GLUCOSE, CAPILLARY
Glucose-Capillary: 141 mg/dL — ABNORMAL HIGH (ref 70–99)
Glucose-Capillary: 115 mg/dL — ABNORMAL HIGH (ref 70–99)
Glucose-Capillary: 190 mg/dL — ABNORMAL HIGH (ref 70–99)
Glucose-Capillary: 291 mg/dL — ABNORMAL HIGH (ref 70–99)

## 2020-05-13 MED ORDER — VANCOMYCIN HCL IN DEXTROSE 1-5 GM/200ML-% IV SOLN
1000.0000 mg | INTRAVENOUS | Status: DC
  Administered 2020-05-18: 1000 mg via INTRAVENOUS
  Filled 2020-05-13 (×3): qty 200

## 2020-05-13 MED ORDER — SODIUM CHLORIDE 0.9 % IV SOLN
2.0000 g | INTRAVENOUS | Status: DC
  Administered 2020-05-18 – 2020-06-05 (×11): 2 g via INTRAVENOUS
  Filled 2020-05-13 (×11): qty 2

## 2020-05-13 MED ORDER — HEPARIN SODIUM (PORCINE) 1000 UNIT/ML IJ SOLN
INTRAMUSCULAR | Status: AC
  Filled 2020-05-13: qty 4

## 2020-05-13 MED ORDER — DARBEPOETIN ALFA 100 MCG/0.5ML IJ SOSY: 100.0000 ug | PREFILLED_SYRINGE | INTRAMUSCULAR | Status: DC

## 2020-05-13 MED ORDER — VANCOMYCIN HCL IN DEXTROSE 1-5 GM/200ML-% IV SOLN: 1000.0000 mg | Freq: Once | INTRAVENOUS | Status: AC

## 2020-05-13 MED ORDER — VANCOMYCIN HCL IN DEXTROSE 1-5 GM/200ML-% IV SOLN
1000.0000 mg | Freq: Once | INTRAVENOUS | Status: AC
  Administered 2020-05-13: 15:00:00 1000 mg via INTRAVENOUS
  Filled 2020-05-13 (×2): qty 200

## 2020-05-13 NOTE — Progress Notes (Signed)
Subjective:   No acute overnight events. Received HD yesterday and a short HD session today to transition to TTS.  Reports feeling fine. Somewhat frustrated about having to change dialysis times to TTS. Feet and sacral wounds wrapped up in gauze.   Still awaiting CIR bed, reports that he was told it would take 1-2 weeks.  No questions or concerns at this time.  Objective:  Vital signs in last 24 hours: Vitals:   05/13/20 1130 05/13/20 1200 05/13/20 1230 05/13/20 1245  BP: 130/87 124/72 120/70 138/83  Pulse:      Resp:      Temp:    98.4 F (36.9 C)  TempSrc:    Oral  SpO2:    98%  Weight:    (!) 151.5 kg  Height:       Physical Exam: Physical Exam Constitutional:      Appearance: He is obese. He is not ill-appearing.  HENT:     Head: Normocephalic and atraumatic.     Mouth/Throat:     Mouth: Mucous membranes are moist.     Pharynx: Oropharynx is clear.  Eyes:     Extraocular Movements: Extraocular movements intact.     Pupils: Pupils are equal, round, and reactive to light.  Cardiovascular:     Rate and Rhythm: Normal rate and regular rhythm.     Pulses: Normal pulses.     Heart sounds: Normal heart sounds. No murmur heard. No friction rub. No gallop.   Pulmonary:     Effort: Pulmonary effort is normal.     Breath sounds: Normal breath sounds. No decreased breath sounds, wheezing, rhonchi or rales.  Abdominal:     General: Bowel sounds are normal.     Palpations: Abdomen is soft.     Tenderness: There is no abdominal tenderness. There is no guarding or rebound.  Musculoskeletal:     Cervical back: Normal range of motion.     Right lower leg: No edema.     Left lower leg: No edema.  Skin:    General: Skin is warm and dry.     Comments: Coccygeal and bilateral feet wounds wrapped in gauze.  Neurological:     General: No focal deficit present.     Mental Status: He is alert and oriented to person, place, and time.  Psychiatric:        Mood and Affect: Mood  normal.        Behavior: Behavior normal.     Assessment/Plan: Mr. Brem is 49yo male with ESRD, hypertension, insulin-depending type II diabetes mellitus admitted 04/29/20 for sacral decubitus ulcer and COVID-19 pneumonia, continuing to progress well on IV antibiotics, awaiting CIR bed placement.   Principal Problem:   Sacral osteomyelitis ([place]) Active Problems:   Insulin dependent type 2 diabetes mellitus (HCC)   HTN (hypertension)   History of anemia due to chronic kidney disease   Pressure injury of both heels, unstageable ([place])   Pneumonia due to COVID-19 virus   ESRD (end stage renal disease) (HCC)   Avascular necrosis of first metatarsal, left foot (HCC)   Osteomyelitis of dorsal first metatarsal, left foot (HCC)   Decubitus ulcer of sacral region, unstageable ([place])   Morbid obesity (HCC)  #Unstageable sacral decubitus ulcer #Coccygeal osteomyelitis Continue with long-term antibiotics for osteomyelitis. Will continue wound care per WOCN. 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 - C/w antibiotics - C/w dressings, Prevalon boots - wound care  #ESRD on HD Nephrology following, appreciate recs. Patient had HD yesterday and will undergo short HD today to return to TTS schedule. - received short HD today  #Type II diabetes mellitus AM CBG 141. Continues to be controlled with regimen. No changes at this time. - C/w Lantus 10u, SSI - CBG monitoring TID, QHS  #Hypertension Controlled with current home regimen. SBPs 120s-130s. - C/w metoprolol tartrate 25mg  BID, amlodipine  10mg  daily  #Deconditioning #Generalized weakness Patient to continue working with PT/OT, waiting for bed placement with CIR. - continue feeding supplements for nutrition - PT/OT - Pending CIR placement  DIET: CM IVF: n/a DVT PPX: subQ heparin BOWEL: Senokot CODE: FULL FAM COM: n/a  Prior to Admission Living Arrangement: SNF Anticipated Discharge Location: CIR Barriers to Discharge: CIR bed availability Dispo: Anticipated discharge in approximately 1-2 day(s).   Virl Axe, MD 05/13/2020, 1:42 PM Pager: [phone] After 5pm on weekdays and 1pm on weekends: On Call pager [phone]

## 2020-05-13 NOTE — Progress Notes (Signed)
Inpatient Rehab Admissions Coordinator:   Met with pt while on dialysis, sister on the phone.  Let him know that we do not have a bed available for him to admit to CIR today, but we will continue to follow for potential admit as soon as a bed becomes available.    Shann Medal, PT, DPT Admissions Coordinator [phone] 05/13/20  11:38 AM

## 2020-05-13 NOTE — Progress Notes (Signed)
Physical Therapy Wound Treatment [name] MRN: [id number] Date of Birth: 1970-06-13  Today's Date: 05/13/2020 Time: 4098-1191 Time Calculation (min): 44 min  Subjective  Subjective: Agreeable to hydrotherapy. Patient and Family Stated Goals: I really want to get healed up. Date of Onset:  (prior to admission) Prior Treatments: Dressing changes  Pain Score: Pt tolerated treatment without complaints of pain.   Wound Assessment  Pressure Injury 05/01/20 Buttocks Left Stage 4 - Full thickness tissue loss with exposed bone, tendon or muscle. packed with santyl gauze, dressed by WOCN, (Active)  Dressing Type ABD;Barrier Film (skin prep);Gauze (Comment);Moist to dry 05/13/20 1432  Dressing Changed;Clean;Dry;Intact 05/13/20 1432  Dressing Change Frequency Daily 05/13/20 1432  State of Healing Early/partial granulation 05/13/20 1432  Site / Wound Assessment Pink;Yellow 05/13/20 1432  % Wound base Red or Granulating 45% 05/13/20 1432  % Wound base Yellow/Fibrinous Exudate 55% 05/13/20 1432  % Wound base Black/Eschar 0% 05/13/20 1432  % Wound base Other/Granulation Tissue (Comment) 0% 05/13/20 1432  Peri-wound Assessment Intact;Pink 05/13/20 1432  Wound Length (cm) 6 cm 05/11/20 1400  Wound Width (cm) 6.5 cm 05/11/20 1400  Wound Depth (cm) 3.8 cm 05/11/20 1400  Wound Surface Area (cm^2) 39 cm^2 05/11/20 1400  Wound Volume (cm^3) 148.2 cm^3 05/11/20 1400  Tunneling (cm) 0 05/11/20 1442  Undermining (cm) 5.5 cm at 11-12:00, 6.1 cm at 9:00, 5 cm at 8:00, 05/11/20 1400  Margins Unattached edges (unapproximated) 05/13/20 1432  Drainage Amount Moderate 05/13/20 1432  Drainage Description Serosanguineous 05/13/20 1432  Treatment Debridement (Selective);Hydrotherapy (Pulse lavage);Packing (Saline gauze) 05/13/20 1432   Santyl applied to wound bed prior to applying dressing.     Hydrotherapy Pulsed lavage therapy - wound location: L buttock wound Pulsed Lavage with  Suction (psi): 12 psi Pulsed Lavage with Suction - Normal Saline Used: 1000 mL Pulsed Lavage Tip: Tip with splash shield Selective Debridement Selective Debridement - Location: buttock wound Selective Debridement - Tools Used: Forceps;Scissors Selective Debridement - Tissue Removed: yellow unviable tissue   Wound Assessment and Plan  Wound Therapy - Assess/Plan/Recommendations Wound Therapy - Clinical Statement: Overall wound bed appearance improved with loose slough removed without bleeding. Hydrotherapy will continue to follow for selective removal of unviable tissue, to decrease bioburden and promote wound bed healing. Wound Therapy - Functional Problem List: decreased mobility Factors Delaying/Impairing Wound Healing: Diabetes Mellitus;Altered sensation;Immobility Hydrotherapy Plan: Debridement;Dressing change;Patient/family education;Pulsatile lavage with suction Wound Therapy - Frequency: 6X / week Wound Therapy - Current Recommendations: PT Wound Therapy - Follow Up Recommendations: Skilled nursing facility Wound Plan: see above  Wound Therapy Goals- Improve the function of patient's integumentary system by progressing the wound(s) through the phases of wound healing (inflammation - proliferation - remodeling) by: Decrease Necrotic Tissue to: 15% Decrease Necrotic Tissue - Progress: Progressing toward goal Increase Granulation Tissue to: 85% Increase Granulation Tissue - Progress: Progressing toward goal Improve Drainage Characteristics: Min;Serous Goals/treatment plan/discharge plan were made with and agreed upon by patient/family: Yes Time For Goal Achievement: 7 days Wound Therapy - Potential for Goals: Good  Goals will be updated until maximal potential achieved or discharge criteria met.  Discharge criteria: when goals achieved, discharge from hospital, MD decision/surgical intervention, no progress towards goals, refusal/missing three consecutive treatments without  notification or medical reason.  GP     Fred [name] 05/13/2020, 2:37 PM   Fred [name], PT, DPT Acute Rehabilitation Services Pager: [phone] Office: [phone]

## 2020-05-13 NOTE — Progress Notes (Signed)
Pharmacy Antibiotic Note- follow up   Fred [name] is a 49 y.o. male admitted on 04/29/2020 with sepsis.  Pharmacy has been consulted for vancomycin and cefepime dosing for sacral decubitus ulcer and coccygeal osteomyelitis.   Pre-HD vancomycin level was therapeutic at 18 mcg/mL (goal 15-25 mcg/mL) on 12/20.  Was on MWF HD schedule but back to TTS now that off isolation. Had HD today and will have next on 12/26.  Plan: Vancomycin 1000 mg IV today Vancomycin 1000 mg IV after HD on 12/26 Vancomycin 1000 mg IV qHD on TTS - begins 12/28 Cefepime 1 g IV q24h thru 12/27 Cefepime 2 g IV qHD on TTS - starting 12/28 Monitor HD schedule/tolerance, clinical progress, PRN vanc level  ID plans to continue vancomycin/cefepime x 6 weeks, end date 06/11/19   Height: 6\' 6"  (198.1 cm) Weight: (!) 151.5 kg (334 lb) IBW/kg (Calculated) : 91.4  Temp (24hrs), Avg:98.7 F (37.1 C), Min:98.4 F (36.9 C), Max:99.5 F (37.5 C)  Recent Labs  Lab 05/07/20 1633 05/08/20 0621 05/10/20 1226 05/10/20 1426 05/12/20 0920 05/13/20 1009  WBC 8.6 9.3  --  7.1 7.3 6.7  CREATININE 5.10* 3.42*  --  4.93* 4.41*  --   VANCORANDOM  --   --  18  --   --   --     Estimated Creatinine Clearance: 33.1 mL/min (A) (by C-G formula based on SCr of 4.41 mg/dL (H)).    Allergies  Allergen Reactions  . Atorvastatin Hives    NOT on MAR   Vanc 12/9>> (1/20) Cefepime 12/9>> (1/20)  Remdesivir 12/9>>12/14  12/20 VL = 18 mcg/mL on 1g qMWF >> no change  12/9 Bcx: negative  12/9: COVID Positive  Thank you for involving pharmacy in this patient's care.  Renold Genta, PharmD, BCPS Clinical Pharmacist Clinical phone for 05/13/2020 until 3p is [phone] 05/13/2020 1:43 PM  **Pharmacist phone directory can be found on [email or web address] listed under [place]**

## 2020-05-13 NOTE — PMR Pre-admission (Signed)
PMR Admission Coordinator Pre-Admission Assessment  Patient: Fred [name] is an 49 y.o., male MRN: [id number] DOB: Dec 12, 1970 Height: 6' 6"  (198.1 cm) Weight: (!) 161.9 kg  Insurance Information HMO:     PPO:      PCP:      IPA:      80/20:      OTHER:  PRIMARY: Medicare A and B      Policy#: [id number]      Subscriber: pt CM Name:       Phone#:      Fax#:  Pre-Cert#: verified online      Employer:  Benefits:  Phone #:      Name:  Eff. Date: 07/20/13 A and B     Deduct: $1484      Out of Pocket Max: n/a      Life Max: n/a CIR: 100%      SNF: 20 full days Outpatient: 80%     Co-Pay: 20% Home Health: 100%      Co-Pay:  DME: 80%     Co-Pay: 20% Providers:  SECONDARY:       Policy#:      Phone#:   Development worker, community:       Phone#:   The Therapist, art Information Summary" for patients in Inpatient Rehabilitation Facilities with attached "Privacy Act [place] Records" was provided and verbally reviewed with: Patient and Family  Emergency Contact Information Contact Information    Name Relation Home Work Mobile   [place] Sister   [phone]      Current Medical History  Patient Admitting Diagnosis: debility   History of Present Illness: Fred [name] is a 49 year old right-handed male with history of hypertension, diabetes mellitus, obesity with BMI 38.37, end-stage renal disease with hemodialysis Tuesday Thursday Saturday at [facility], and reported medical noncompliance with hypertensive medications.  Patient with a long complicated history.  Prior to patient's initial hospital admission he lived with his 49 year old father in [place], independent prior to admission. Has had complicated medical course since 01/2020 with multiple hospitalizations with d/c to SNF rehab.  Presented 04/29/2020 from [facility] skilled nursing facility for sepsis related to multiple wounds to bilateral heels/feet and sacrum, and recent COVID-19 infection (+ 04/20/2020). On admission  patient with increasing shortness of breath as well as low-grade fever 100.9.  Sacral ulcer did have a foul-smelling odor.  Admission chemistries lactic acid 0.9, blood cultures no growth to date, BUN 11, creatinine 4.41, WBC 5800, hemoglobin 7.8, pro calcitonin 1.24.  Chest x-ray showed diffuse bilateral airspace disease correlated with COVID-19 status.  MRI of the left foot area of ulceration with nailbed irregularity at the first digit with findings suggestive of osteomyelitis involving the first digit distal tuft.  No loculated fluid collection or sinus tract and right foot MRI abnormal osseous edema head of the fifth metatarsal suspicious for osteomyelitis.  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  Surgery was consulted in regards to sacral and heel wounds and was receiving hydrotherapy, now completed.  Placed on vancomycin and cefepime to be ongoing through 06/10/2020.  Dr. Sharol Given was consulted in regards to unstageable pressure wounds of left lateral foot, lateral malleolus right, lateral foot and heel right, and dry gangrene of left fourth toe, as well as left first metatarsal and right fifth metatarsal.  Duda recommended Prevalon boots and would follow as outpatient.  Hemodialysis ongoing as per renal services, monitoring closely for bouts  of hypotension.  Subcutaneous heparin for DVT prophylaxis.  Due to patient's profound deconditioning and multiple medical issues he was recommended for a comprehensive rehabilitation program.    Patient's medical record from [facility] has been reviewed by the rehabilitation admission coordinator and physician.  Past Medical History  Past Medical History:  Diagnosis Date  . Chronic kidney disease    HD MWF  . Diabetes mellitus without complication ([place])   . Hypertension     Family History   family history is not on file.  Prior  Rehab/Hospitalizations Has the patient had prior rehab or hospitalizations prior to admission? Yes  Has the patient had major surgery during 100 days prior to admission? No   Current Medications  Current Facility-Administered Medications:  .  (feeding supplement) PROSource Plus liquid 30 mL, 30 mL, Oral, TID BM, Hoffman, Erik C, DO, 30 mL at 06/05/20 1401 .  acetaminophen (TYLENOL) tablet 650 mg, 650 mg, Oral, Q6H PRN, Marianna Payment, MD, 650 mg at 05/28/20 1226 .  ceFEPIme (MAXIPIME) 2 g in sodium chloride 0.9 % 100 mL IVPB, 2 g, Intravenous, Q T,Th,Sat-1800, Pham, Minh Q, RPH-CPP, Last Rate: 200 mL/hr at 06/05/20 1844, 2 g at 06/05/20 1844 .  ceFEPIme (MAXIPIME) 2 g in sodium chloride 0.9 % 100 mL IVPB, 2 g, Intravenous, Once, Hoffman, Erik C, DO .  Chlorhexidine Gluconate Cloth 2 % PADS 6 each, 6 each, Topical, Q0600, Penninger, Ria Comment, [place] .  Darbepoetin Alfa (ARANESP) injection 200 mcg, 200 mcg, Subcutaneous, Q Sat-1800, Schertz, Robert, MD .  famotidine (PEPCID) tablet 20 mg, 20 mg, Oral, QHS, Angelica Pou, MD, 20 mg at 06/05/20 2144 .  feeding supplement (NEPRO CARB STEADY) liquid 237 mL, 237 mL, Oral, BID BM, Zeyfang, David, PA-C, 237 mL at 06/05/20 1401 .  heparin injection 7,500 Units, 7,500 Units, Subcutaneous, Q8H, Pham, Minh Q, RPH-CPP, 7,500 Units at 06/06/20 0606 .  insulin aspart (novoLOG) injection 0-5 Units, 0-5 Units, Subcutaneous, QHS, Iona Beard, MD, 2 Units at 05/26/20 2137 .  insulin aspart (novoLOG) injection 0-9 Units, 0-9 Units, Subcutaneous, TID WC, Iona Beard, MD, 2 Units at 06/05/20 1159 .  insulin glargine (LANTUS) injection 14 Units, 14 Units, Subcutaneous, QHS, Katsadouros, Vasilios, MD, 14 Units at 06/05/20 2145 .  loperamide (IMODIUM) capsule 2 mg, 2 mg, Oral, Q4H, Katsadouros, Vasilios, MD, 2 mg at 06/06/20 0606 .  metoCLOPramide (REGLAN) tablet 5 mg, 5 mg, Oral, TID Moise Boring, MD, 5 mg at 06/05/20 0901 .  multivitamin (RENA-VIT) tablet  1 tablet, 1 tablet, Oral, QHS, Ernest Haber, PA-C, 1 tablet at 06/05/20 2144 .  ondansetron (ZOFRAN) tablet 4 mg, 4 mg, Oral, Q6H PRN **OR** ondansetron (ZOFRAN) injection 4 mg, 4 mg, Intravenous, Q6H PRN, Marianna Payment, MD .  oxyCODONE-acetaminophen (PERCOCET/ROXICET) 5-325 MG per tablet 1 tablet, 1 tablet, Oral, Q4H PRN, Marianna Payment, MD .  sodium chloride flush (NS) 0.9 % injection 3 mL, 3 mL, Intravenous, Q12H, Marianna Payment, MD, 3 mL at 06/05/20 2149 .  vancomycin (VANCOCIN) IVPB 1000 mg/200 mL premix, 1,000 mg, Intravenous, Q T,Th,Sa-HD, Pham, Minh Q, RPH-CPP, Last Rate: 200 mL/hr at 06/04/20 1119, 1,000 mg at 06/04/20 1119  Patients Current Diet:  Diet Order            Diet Carb Modified Fluid consistency: Thin; Room service appropriate? Yes; Fluid restriction: 1200 mL Fluid  Diet effective now                 Precautions / Restrictions Precautions  Precautions: Fall Precaution Comments: wounds on bilat feet and L sacrum, monitor orthostatic BPs Other Brace: Ace wrap in room to be donned to BLE and abdominal binder prior to EOB, B prevalon boots in room Restrictions Weight Bearing Restrictions: No   Has the patient had 2 or more falls or a fall with injury in the past year? Yes  Prior Activity Level Limited Community (1-2x/wk): in the last 3 months has had extended hospitalization and was in rehab at [facility], where he was admitted from; was beginning gait training when he was admitted to [facility] for sepsis Community (5-7x/wk): typical baseline is independent, no DME, and driving  Prior Functional Level Self Care: Did the patient need help bathing, dressing, using the toilet or eating? Prior to September, independent.  Since hospitalization in Sept requiring assist for all ADLs  Indoor Mobility: Did the patient need assistance with walking from room to room (with or without device)? Prior to September, independent.  Since hospitalization in September, had just begun to do  pre-gait training with PT at [facility].   Stairs: Did the patient need assistance with internal or external stairs (with or without device)? Prior to September, independent.  Since hospitalization in September, unable.    Functional Cognition: Did the patient need help planning regular tasks such as shopping or remembering to take medications? Needed some help.   Home Assistive Devices / Equipment Home Assistive Devices/Equipment: Wheelchair  Prior Device Use: Indicate devices/aids used by the patient prior to current illness, exacerbation or injury? No DME at September baseline  Current Functional Level Cognition  Overall Cognitive Status: No family/caregiver present to determine baseline cognitive functioning Current Attention Level: Selective Orientation Level: Oriented X4 Following Commands: Follows one step commands consistently,Follows one step commands with increased time Safety/Judgement: Decreased awareness of safety,Decreased awareness of deficits General Comments: Pt pleasant and motivated to complete all tasks, increased processing times and at times poor insight into functional deficits and what he needs to do to progress strength/endurance despite frequent instruction. Reinforced therex and HOB up/down during day every day.    Extremity Assessment (includes Sensation/Coordination)  Upper Extremity Assessment: Generalized weakness,RUE deficits/detail,LUE deficits/detail RUE Deficits / Details: impaired shoulder flexion RUE Sensation: decreased light touch RUE Coordination: decreased fine motor,decreased gross motor LUE Deficits / Details: impaired shoulder strength/ROM LUE Sensation: decreased light touch LUE Coordination: decreased fine motor,decreased gross motor  Lower Extremity Assessment: Defer to PT evaluation RLE Deficits / Details: several black spots over R and L feet - ortho following. RLE Sensation: decreased light touch,history of peripheral neuropathy LLE  Sensation: decreased light touch,history of peripheral neuropathy    ADLs  Overall ADL's : Needs assistance/impaired Eating/Feeding: Set up,Bed level Eating/Feeding Details (indicate cue type and reason): malfunction with bed settings, could not elevate bed for self feeding and unsafe to leave pt sitting EOB to eat. Pt propped up on elbow for lunch. Grooming: Set up,Supervision/safety,Sitting,Wash/dry face,Oral care,Wash/dry hands Grooming Details (indicate cue type and reason): EOB Upper Body Bathing: Min guard,Sitting Upper Body Bathing Details (indicate cue type and reason): simulated seated EOB Lower Body Bathing: Moderate assistance,Sitting/lateral leans Lower Body Bathing Details (indicate cue type and reason): simulated seated EOB Upper Body Dressing : Minimal assistance,Sitting,Bed level Lower Body Dressing: Maximal assistance,Bed level Lower Body Dressing Details (indicate cue type and reason): Pt able to form figure four position in bed but could not bring feet close enough to don socks. decreased coordination/control of B LE Toilet Transfer: +2 for physical assistance,+2 for safety/equipment,Moderate assistance Toilet Transfer  Details (indicate cue type and reason): sit - stand mod A +2 from EOB 2 person HHA Toileting- Clothing Manipulation and Hygiene: Total assistance Toileting - Clothing Manipulation Details (indicate cue type and reason): posterior perihygiene rolling in bed x 2 occasions due to diarrhea Functional mobility during ADLs: Moderate assistance,+2 for physical assistance General ADL Comments: Continues to be limited by decreased strength, dropping BP with activities    Mobility  Overal bed mobility: Needs Assistance Bed Mobility: Rolling,Sidelying to Sit,Sit to Sidelying Rolling: Modified independent (Device/Increase time) Sidelying to sit: Supervision Supine to sit: Min assist Sit to supine: Min assist Sit to sidelying: Supervision General bed mobility  comments: pt able to perform log roll with supervision and pull up in bed in supine with supervision    Transfers  Overall transfer level: Needs assistance Equipment used: 2 person hand held assist Transfer via Lift Equipment: Stedy Transfers: Sit to/from Stand Sit to Stand: Max assist,+2 physical assistance,From elevated surface  Lateral/Scoot Transfers: Total assist General transfer comment: STS x1 attempt (dizzy)    Ambulation / Gait / Stairs / Wheelchair Mobility  Ambulation/Gait Ambulation/Gait assistance:  (NT) General Gait Details: unable    Posture / Balance Dynamic Sitting Balance Sitting balance - Comments: able to sit EOB with close supervision Balance Overall balance assessment: Needs assistance Sitting-balance support: Feet supported,Single extremity supported Sitting balance-Leahy Scale: Fair Sitting balance - Comments: able to sit EOB with close supervision Postural control: Right lateral lean Standing balance support: Bilateral upper extremity supported,During functional activity Standing balance-Leahy Scale: Poor Standing balance comment: reliant on Stedy rail and +2 min/modA for upright posture, able to stand with U UE support and do LUE activity with modA; stood ~1 minute    Special needs/care consideration Dialysis: Hemodialysis Tuesday, Thursday and Saturday, Special Bed air mattress overlay, Skin multiple wounds, see flowsheet for details, Diabetic management yes and Special service needs hydrotherapy   Previous Home Environment (from acute therapy documentation) Living Arrangements: Group Home Home Care Services: No Additional Comments: [name]  Discharge Living Setting Plans for Discharge Living Setting: Lives with (comment) (dad) Type of Home at Discharge:  (house) Discharge Home Layout: One level Discharge Home Access: Level entry Discharge Bathroom Shower/Tub: Tub/shower unit Discharge Bathroom Toilet: Standard Discharge Bathroom Accessibility:  No Does the patient have any problems obtaining your medications?: Yes (Describe)  Social/Family/Support Systems Anticipated Caregiver: sister, [name] is main contact Anticipated Caregiver's Contact Information: [phone] Ability/Limitations of Caregiver: supervision Caregiver Availability: 24/7 Discharge Plan Discussed with Primary Caregiver: Yes Is Caregiver In Agreement with Plan?: Yes Does Caregiver/Family have Issues with Lodging/Transportation while Pt is in Rehab?: No  Goals Patient/Family Goal for Rehab: PT/OT supervision min guard w/c level, SLP n/a Expected length of stay: 24-28 days Pt/Family Agrees to Admission and willing to participate: Yes Program Orientation Provided & Reviewed with Pt/Caregiver Including Roles  & Responsibilities: Yes  Barriers to Discharge: Decreased caregiver support,Inaccessible home environment  Decrease burden of Care through IP rehab admission: Otherhydrotherapy  Possible need for SNF placement upon discharge: potentially   Patient Condition: I have reviewed medical records from [facility], spoken with CM, and patient and family member. I met with patient at the bedside for inpatient rehabilitation assessment.  Patient will benefit from ongoing PT and OT, can actively participate in 3 hours of therapy a day 5 days of the week, and can make measurable gains during the admission.  Patient will also benefit from the coordinated team approach during an Inpatient Acute Rehabilitation admission.  The patient  will receive intensive therapy as well as Rehabilitation physician, nursing, social worker, and care management interventions.  Due to bladder management, safety, skin/wound care, disease management, medication administration, pain management and patient education the patient requires 24 hour a day rehabilitation nursing.  The patient is currently mod+2 with mobility and basic ADLs, and limited by orthostatic hypotension.  Discharge setting  and therapy post discharge at a lower level of care is anticipated.  Patient has agreed to participate in the Acute Inpatient Rehabilitation Program and will admit 06/06/2020.  Preadmission Screen Completed By: Shann Medal, PT, DPT with updates completed by  Genella Mech, 06/06/2020 8:09 AM with updates by Clemens Catholic ______________________________________________________________________   Discussed status with Dr. Ranell Patrick  on 06/06/20 at 8:09 AM  and received approval for admission 06/06/2020.  Admission Coordinator: Shann Medal, PT, DPT and  Genella Mech, CCC-SLP, time 8:09 AM Sudie Grumbling 06/06/2020  Assessment/Plan: Diagnosis: Sacral osteomyelitis 1. Does the need for close, 24 hr/day Medical supervision in concert with the patient's rehab needs make it unreasonable for this patient to be served in a less intensive setting? Yes 2. Co-Morbidities requiring supervision/potential complications: ESRD, HTN, insulin dependent type 2 DM, COVID-19 pneumonia, gangrene and osteomyelitis of L foot digits 3. Due to bladder management, bowel management, safety, skin/wound care, disease management, medication administration, pain management and patient education, does the patient require 24 hr/day rehab nursing? Yes 4. Does the patient require coordinated care of a physician, rehab nurse, PT, OT to address physical and functional deficits in the context of the above medical diagnosis(es)? Yes Addressing deficits in the following areas: balance, endurance, locomotion, strength, transferring, bowel/bladder control, bathing, dressing, feeding, grooming, toileting and psychosocial support 5. Can the patient actively participate in an intensive therapy program of at least 3 hrs of therapy 5 days a week? Yes 6. The potential for patient to make measurable gains while on inpatient rehab is excellent 7. Anticipated functional outcomes upon discharge from inpatient rehab: min assist PT, min assist OT, independent  SLP 8. Estimated rehab length of stay to reach the above functional goals is: 2-3 weeks 9. Anticipated discharge destination: Home 10. Overall Rehab/Functional Prognosis: excellent   MD Signature: Leeroy Cha, MD

## 2020-05-13 NOTE — Progress Notes (Signed)
Physical Therapy Treatment Patient Details Name: Fred [name] MRN: [id number] DOB: [birth year] Today's Date: 05/13/2020    History of Present Illness Pt is a 49 y.o. male admitted from [facility] on 04/29/20 for evaluation of low oxygen saturations 82% on room air; concern for sepsis form Covid PNA, sacral wound and foot wounds. 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 Of note, pt recently admitted to [facility] due to gallbladder problems then d/c to [facility](14 days) for rehab then readmitted to [facility] then d/c to [facility] for rehab. PMH includes ESRD on HD (started 9/21), HTN, DM, C. diff.    PT Comments    Pt supine on arrival, agreeable to therapy session with good participation. Session focus on seated/supine BLE therex and seated balance activity. Pt orthostatic with supine to sit transition which worsened the longer pt sat EOB, RN notified, pt with subjective dizziness which increased as seated time progressed. Pt given HEP handout (HEP: [email or web address] Access Code: [email or web address]) encouraged to perform TID as able.  Orthostatic BPs  Supine 141/87; HR 81 bpm  Sitting 112/69 HR 80's bpm  Sitting after 3 min 112/80; HR 99 bpm  Sitting after 5 mins 100/85; HR 102 bpm  Supine 131/87; HR 90's bpm    Pt continues to benefit from PT services to progress toward functional mobility goals. D/C recs below remain appropriate.  Follow Up Recommendations  CIR;Supervision for mobility/OOB     Equipment Recommendations  Wheelchair (measurements PT);Wheelchair cushion (measurements PT);Hospital bed;Other (comment)    Recommendations for Other Services       Precautions / Restrictions Precautions Precautions: Fall Precaution Comments: Multiple wounds; (+) orthostatic hypotension with syncope 12/16 Required Braces or Orthoses: Other Brace Other Brace: B prevalon boots (RN notified pt c/o redness to L lateral  heel aggravated by boots, RN to assess and left boots off) Restrictions Weight Bearing Restrictions: No    Mobility  Bed Mobility Overal bed mobility: Needs Assistance Bed Mobility: Rolling;Sidelying to Sit;Sit to Sidelying Rolling: Min assist Sidelying to sit: Mod assist;+2 for physical assistance     Sit to sidelying: Mod assist;HOB elevated General bed mobility comments: head of bed partially elevated (~25 deg) prior to attempting sidelying to sit transition, pt able to assist with BLE clearance from EOB and trunk rise; modA for trunk steadying and BLE placement  Transfers Overall transfer level: Needs assistance   Transfers: Lateral/Scoot Transfers          Lateral/Scoot Transfers: Total assist General transfer comment: pt attempted seated lateral scooting toward HOB but poor propulsion achieved; deferred standing as pt more orthostatic with increased time seated EOB  Ambulation/Gait                 Stairs             Wheelchair Mobility    Modified Rankin (Stroke Patients Only)       Balance Overall balance assessment: Needs assistance Sitting-balance support: No upper extremity supported;Bilateral upper extremity supported;Single extremity supported Sitting balance-Leahy Scale: Fair Sitting balance - Comments: pt able to perform BUE reaching to shoulder height and seated BUE rowing exercise without LOB/Supervision for safety. also performed seated LE therex no LOB                                    Cognition Arousal/Alertness: Awake/alert Behavior During Therapy: WFL for tasks assessed/performed;Flat affect Overall Cognitive Status:  No family/caregiver present to determine baseline cognitive functioning Area of Impairment: Following commands;Safety/judgement;Awareness;Problem solving;Attention                 Orientation Level: Disoriented to;Place;Time;Situation Current Attention Level: Selective Memory: Decreased  short-term memory Following Commands: Follows one step commands consistently;Follows multi-step commands inconsistently Safety/Judgement: Decreased awareness of safety;Decreased awareness of deficits Awareness: Emergent Problem Solving: Slow processing;Requires verbal cues General Comments: pt with flat affect and slow processing, some word finding difficulty at times. Requires increased time and cues to process basic tasks. Difficulty stating name of hospital.      Exercises General Exercises - Lower Extremity Ankle Circles/Pumps: AROM;Strengthening;Both;10 reps;Seated;Supine [employer] Sets: AROM;Strengthening;Both;5 reps;Supine Gluteal Sets: AROM;Strengthening;Both;5 reps;Supine Long Arc Quad: AROM;Strengthening;Both;Seated;10 reps Hip Flexion/Marching: AROM;Strengthening;Both;Seated;10 reps Other Exercises Other Exercises: seated rowing 1x10 reps BUE    General Comments General comments (skin integrity, edema, etc.): small scrape on RLE, RN notified, pt reporting soreness to L lateral ankle where it was pressing on prevalon boot, RN notified and deferred to replace boots until she could assess; see ortho BP in comments above      Pertinent Vitals/Pain Pain Assessment: 0-10 Pain Score: 2  Faces Pain Scale: Hurts a little bit Pain Location: Generalized/back Pain Descriptors / Indicators: Discomfort Pain Intervention(s): Monitored during session;Repositioned    Home Living                      Prior Function            PT Goals (current goals can now be found in the care plan section) Acute Rehab PT Goals Patient Stated Goal: Heal wounds; be able to walk outside PT Goal Formulation: With patient Time For Goal Achievement: 05/26/19 Potential to Achieve Goals: Good    Frequency    Min 3X/week      PT Plan Current plan remains appropriate    Co-evaluation              AM-PAC PT "6 Clicks" Mobility   Outcome Measure  Help needed turning from your back  to your side while in a flat bed without using bedrails?: A Little Help needed moving from lying on your back to sitting on the side of a flat bed without using bedrails?: A Lot Help needed moving to and from a bed to a chair (including a wheelchair)?: Total Help needed standing up from a chair using your arms (e.g., wheelchair or bedside chair)?: Total Help needed to walk in hospital room?: Total Help needed climbing 3-5 steps with a railing? : Total 6 Click Score: 9    End of Session   Activity Tolerance: Patient tolerated treatment well;Other (comment) (orthostatic hypotension) Patient left: in bed;with call bell/phone within reach;with bed alarm set;with nursing/sitter in room Nurse Communication: Mobility status;Need for lift equipment PT Visit Diagnosis: Muscle weakness (generalized) (M62.81);Difficulty in walking, not elsewhere classified (R26.2)     Time: 3532-9924 PT Time Calculation (min) (ACUTE ONLY): 32 min  Charges:  $Therapeutic Exercise: 8-22 mins $Therapeutic Activity: 8-22 mins                     Xena Propst P., PTA Acute Rehabilitation Services Pager: [phone] Office: [place] 05/13/2020, 4:50 PM

## 2020-05-13 NOTE — Care Management Important Message (Signed)
Important Message  [name] MRN: [id number] Date of Birth: 1970-09-22   Medicare Important Message Given:  Yes     Florinda Taflinger P Soham 05/13/2020, 2:45 PM

## 2020-05-13 NOTE — Progress Notes (Signed)
PT Cancellation Note  Patient Details Name: Fred [name] MRN: [id number] DOB: [birth year]   Cancelled Treatment:    Reason Eval/Treat Not Completed: (P) Patient at procedure or test/unavailable (at HD dept). Will continue efforts in afternoon per PT POC as schedule permits.   Marrisa Kimber M Azayla Polo 05/13/2020, 11:15 AM

## 2020-05-13 NOTE — Progress Notes (Signed)
[facility] Progress Note   Subjective: Seems brighter, more interactive today. Hasn't been OOB yet. HD today to resume T,Th,S schedule.   Objective Vitals:   05/13/20 1000 05/13/20 1030 05/13/20 1100 05/13/20 1130  BP: 136/77 130/70 126/72 130/87  Pulse: 87     Resp:      Temp:      TempSrc:      SpO2:      Weight:      Height:       Physical Exam General:Chronically ill appearing male in NAD Heart:S1,S2 RRR No M/G/R Lungs:slightly decreased in bases otherwise CTAB Abdomen:obese active BS, NT Extremities:No LE edema. Ulcers in various stages of healing on feet. Dialysis Access:RIJ TDC drsg intactblood lines connected   Additional Objective Labs: Basic Metabolic Panel: Recent Labs  Lab 05/07/20 1633 05/08/20 0621 05/10/20 1426 05/12/20 0920  NA 136 137 133* 133*  K 4.0 4.0 5.0 4.6  CL 99 101 99 98  CO2 28 27 25 27   GLUCOSE 219* 167* 225* 137*  BUN 34* 18 39* 34*  CREATININE 5.10* 3.42* 4.93* 4.41*  CALCIUM 7.4* 7.8* 8.0* 7.8*  PHOS 3.3  --  5.1* 4.1   Liver Function Tests: Recent Labs  Lab 05/08/20 0621 05/10/20 1426 05/12/20 0920  AST 18  --   --   ALT 18  --   --   ALKPHOS 274*  --   --   BILITOT 0.6  --   --   PROT 5.6*  --   --   ALBUMIN 1.5* 1.4* 1.4*   No results for input(s): LIPASE, AMYLASE in the last 168 hours. CBC: Recent Labs  Lab 05/07/20 1633 05/08/20 0621 05/10/20 1426 05/12/20 0920 05/13/20 1009  WBC 8.6 9.3 7.1 7.3 6.7  HGB 7.0* 8.5* 8.2* 8.1* 7.7*  HCT 23.1* 28.1* 27.5* 26.6* 25.9*  MCV 84.0 84.9 86.8 86.6 87.8  PLT 204 174 183 194 175   Blood Culture    Component Value Date/Time   SDES BLOOD SITE NOT SPECIFIED 04/29/2020 1210   SPECREQUEST  04/29/2020 1210    BOTTLES DRAWN AEROBIC AND ANAEROBIC Blood Culture adequate volume   CULT  04/29/2020 1210    NO GROWTH 5 DAYS Performed at [facility] [address] [place], [place] [address]    REPTSTATUS 05/04/2020 FINAL 04/29/2020 1210     Cardiac Enzymes: No results for input(s): CKTOTAL, CKMB, CKMBINDEX, TROPONINI in the last 168 hours. CBG: Recent Labs  Lab 05/11/20 2015 05/12/20 0639 05/12/20 1702 05/12/20 2135 05/13/20 0649  GLUCAP 149* 137* 126* 245* 141*   Iron Studies: No results for input(s): IRON, TIBC, TRANSFERRIN, FERRITIN in the last 72 hours. @lablastinr3 @ Studies/Results: No results found. Medications: . ceFEPime (MAXIPIME) IV 1 g (05/12/20 2346)  . vancomycin 1,000 mg (05/12/20 1048)   . (feeding supplement) PROSource Plus  30 mL Oral BID BM  . amLODipine  10 mg Oral Daily  . Chlorhexidine Gluconate Cloth  6 each Topical Q0600  . collagenase   Topical BID  . darbepoetin (ARANESP) injection - DIALYSIS  100 mcg Intravenous Q Wed-HD  . famotidine  20 mg Oral QHS  . feeding supplement (NEPRO CARB STEADY)  237 mL Oral BID BM  . heparin injection (subcutaneous)  5,000 Units Subcutaneous Q8H  . insulin aspart  0-5 Units Subcutaneous QHS  . insulin aspart  0-9 Units Subcutaneous TID WC  . insulin glargine  10 Units Subcutaneous QHS  . lidocaine-EPINEPHrine  20 mL Infiltration Once  .  metoCLOPramide  5 mg Oral TID AC  . metoprolol tartrate  50 mg Oral BID  . multivitamin  1 tablet Oral QHS  . senna  1 tablet Oral BID  . sodium chloride flush  3 mL Intravenous Q12H     OP VP:XTGGY (on COVID shift) , was TTS 4h 400/800 143.5kg 3K/2.5Ca TDC -Heparin3000units IV TIW -Mircera157mcgIVq2wks - last 12/8 -Venofer 100mg IVqHD x10 - completed 6/10 doses  Assessment/ Plan: 1. Sacral decub/ coccygealosteo-per ID cont vanc/ maxipime, D#9. Continues hydrotherapy. 6 wks total IV abx thru 06/10/20 per ID. 2. Osteomyelitis L foot/ pressure injury bilat heels -per ID cont vanc/ maxipime. PT/OT recommending CIR. 3. COVID PNA- tested positive on 11/30, is off isolation now. 4. ESRD-Schedule changed for COVID. HD today on schedule. Next HD on Holiday schedule 05/16/2020.   5. Hypertension/volume-Attempting to lower volume today. Attempting 4 liters today. BP stable.  6. Anemiaof CKD- Hgb 7.7.Started weekly Aranesp 100 mcg IV 12/22.Change to T,Th,S schedule for next week.  7. Secondary Hyperparathyroidism -CCa in goal. Phos improved to4.1 S/Psupplementation. Not on VDRA or binders. Labs pending.  8. Nutrition-alb 1.4Change to renal/Carb mod diet with fluid restrictions as K+ 5.0. Continueprosourceand Nepro supplement  9. DM T 2 - per admit    Nolyn Swab H. Geoffrey Mankin NP-C 05/13/2020, 12:03 PM  [employer] [phone]

## 2020-05-13 NOTE — Plan of Care (Signed)
  Problem: Coping: Goal: Psychosocial and spiritual needs will be supported Outcome: Progressing   Problem: Respiratory: Goal: Will maintain a patent airway Outcome: Progressing Goal: Complications related to the disease process, condition or treatment will be avoided or minimized Outcome: Progressing   Problem: Activity: Goal: Risk for activity intolerance will decrease Outcome: Progressing   Problem: Coping: Goal: Level of anxiety will decrease Outcome: Progressing   Problem: Skin Integrity: Goal: Risk for impaired skin integrity will decrease Outcome: Progressing   Problem: Safety: Goal: Ability to remain free from injury will improve Outcome: Progressing   Problem: Pain Managment: Goal: General experience of comfort will improve Outcome: Progressing

## 2020-05-14 LAB — CBC WITH DIFFERENTIAL/PLATELET
Abs Immature Granulocytes: 0.12 10*3/uL — ABNORMAL HIGH (ref 0.00–0.07)
Basophils Absolute: 0.1 10*3/uL (ref 0.0–0.1)
Basophils Relative: 1 %
Eosinophils Absolute: 0.1 10*3/uL (ref 0.0–0.5)
Eosinophils Relative: 1 %
HCT: 26.7 % — ABNORMAL LOW (ref 39.0–52.0)
Hemoglobin: 8.5 g/dL — ABNORMAL LOW (ref 13.0–17.0)
Immature Granulocytes: 2 %
Lymphocytes Relative: 23 %
Lymphs Abs: 1.9 10*3/uL (ref 0.7–4.0)
MCH: 27.1 pg (ref 26.0–34.0)
MCHC: 31.8 g/dL (ref 30.0–36.0)
MCV: 85 fL (ref 80.0–100.0)
Monocytes Absolute: 1 10*3/uL (ref 0.1–1.0)
Monocytes Relative: 13 %
Neutro Abs: 4.8 10*3/uL (ref 1.7–7.7)
Neutrophils Relative %: 60 %
Platelets: 186 10*3/uL (ref 150–400)
RBC: 3.14 MIL/uL — ABNORMAL LOW (ref 4.22–5.81)
RDW: 18.5 % — ABNORMAL HIGH (ref 11.5–15.5)
WBC: 8 10*3/uL (ref 4.0–10.5)
nRBC: 0 % (ref 0.0–0.2)

## 2020-05-14 LAB — BASIC METABOLIC PANEL
Anion gap: 8 (ref 5–15)
BUN: 19 mg/dL (ref 6–20)
CO2: 28 mmol/L (ref 22–32)
Calcium: 8.2 mg/dL — ABNORMAL LOW (ref 8.9–10.3)
Chloride: 97 mmol/L — ABNORMAL LOW (ref 98–111)
Creatinine, Ser: 3.04 mg/dL — ABNORMAL HIGH (ref 0.61–1.24)
GFR, Estimated: 24 mL/min — ABNORMAL LOW (ref >60)
Glucose, Bld: 157 mg/dL — ABNORMAL HIGH (ref 70–99)
Potassium: 3.6 mmol/L (ref 3.5–5.1)
Sodium: 133 mmol/L — ABNORMAL LOW (ref 135–145)

## 2020-05-14 LAB — GLUCOSE, CAPILLARY
Glucose-Capillary: 126 mg/dL — ABNORMAL HIGH (ref 70–99)
Glucose-Capillary: 139 mg/dL — ABNORMAL HIGH (ref 70–99)
Glucose-Capillary: 172 mg/dL — ABNORMAL HIGH (ref 70–99)
Glucose-Capillary: 188 mg/dL — ABNORMAL HIGH (ref 70–99)

## 2020-05-14 NOTE — Progress Notes (Signed)
Subjective:   No acute overnight events.  Reports feeling good today. No complaints or concerns at this time. Still awaiting CIR bed availability.  Objective:  Vital signs in last 24 hours: Vitals:   05/13/20 1245 05/13/20 1947 05/14/20 0342 05/14/20 0900  BP: 138/83 127/83 119/62 126/78  Pulse:  (!) 102 79 88  Resp:  17 15 17   Temp: 98.4 F (36.9 C) 98.6 F (37 C) 98 F (36.7 C) 98 F (36.7 C)  TempSrc: Oral Oral Oral Oral  SpO2: 98% 97% 96% 100%  Weight: (!) 151.5 kg     Height:       Physical Exam: Physical Exam Constitutional:      Appearance: He is obese. He is not ill-appearing.  Cardiovascular:     Rate and Rhythm: Normal rate and regular rhythm.     Pulses: Normal pulses.     Heart sounds: Normal heart sounds. No murmur heard. No friction rub. No gallop.   Pulmonary:     Effort: Pulmonary effort is normal.     Breath sounds: Normal breath sounds. No decreased breath sounds, wheezing, rhonchi or rales.  Abdominal:     General: Bowel sounds are normal.     Palpations: Abdomen is soft. There is no mass.     Tenderness: There is no abdominal tenderness.  Skin:    General: Skin is warm and dry.     Comments: Coccygeal and bilateral feet wounds wrapped in dry gauze.  Neurological:     General: No focal deficit present.     Mental Status: He is alert and oriented to person, place, and time.  Psychiatric:        Mood and Affect: Mood normal.        Behavior: Behavior normal.     Assessment/Plan: Mr. Gariepy is 49yo male with ESRD, hypertension, insulin-depending type II diabetes mellitus admitted 04/29/20 for sacral decubitus ulcer and COVID-19 pneumonia, continuing to progress well on IV antibiotics, awaiting CIR bed placement.   Principal Problem:   Sacral osteomyelitis ([place]) Active Problems:   Insulin dependent type 2 diabetes mellitus (HCC)   HTN (hypertension)   History of anemia due to chronic kidney disease   Pressure injury of both heels,  unstageable ([place])   Pneumonia due to COVID-19 virus   ESRD (end stage renal disease) (HCC)   Avascular necrosis of first metatarsal, left foot (HCC)   Osteomyelitis of dorsal first metatarsal, left foot (HCC)   Decubitus ulcer of sacral region, unstageable ([place])   Morbid obesity (HCC)  #Unstageable sacral decubitus ulcer #Coccygeal osteomyelitis Continue with long-term antibiotics for osteomyelitis. Will continue wound care per WOCN. Awaiting bed placement per CIR - C/w vancomycin, cefepime (end date 06/10/20) - pain management with tylenol q6h prn and percocet q4h prn - Air mattress, off-loading sacral wound - Wound care per WOCN - PT/OT - CIR once bed available  #Unstageable pressure wounds (L lateral foot, lateral malleolus; R lateral foot, R heel) #Dry gangrene L 4th toe #Osteomyelitis L first metatarsal, R 5th metatarsal L lateral foot wound with fibrinous exudate. - C/w antibiotics - C/w dressings, Prevalon boots - wound care  #ESRD on HD Nephrology following, appreciate recs. Patient now on schedule for HD on TTS.  #Type II diabetes mellitus AM CBG 141. Continues to be controlled with regimen. No changes at this time. - C/w Lantus 10u, SSI - CBG monitoring TID, QHS  #Hypertension Controlled with current home regimen. SBPs 120s-130s. - C/w metoprolol tartrate 25mg  BID, amlodipine  10mg  daily  #Deconditioning #Generalized weakness Patient to continue working with PT/OT, waiting for bed placement with CIR. - continue feeding supplements for nutrition - PT/OT - Pending CIR placement  DIET: CM IVF: n/a DVT PPX: subQ heparin BOWEL: Senokot CODE: FULL FAM COM: n/a  Prior to Admission Living Arrangement: SNF Anticipated Discharge Location: CIR Barriers to Discharge: CIR bed availability Dispo: Anticipated discharge in approximately 1-2 day(s).   Virl Axe, MD 05/14/2020, 1:15 PM Pager: [phone] After 5pm on weekdays and 1pm on weekends: On Call pager  [phone]

## 2020-05-14 NOTE — Progress Notes (Signed)
[facility] Progress Note   Subjective: Seen in room, no C/Os. Looks better. Next HD 05/16/2020. He is asking why he is on T,Th,S schedule when he goes MWF at home. Says he is going home after he gets out of hospital. We will continue T,Th,S schedule for now and adjust if needed next week.   Objective Vitals:   05/13/20 1230 05/13/20 1245 05/13/20 1947 05/14/20 0342  BP: 120/70 138/83 127/83 119/62  Pulse:   (!) 102 79  Resp:   17 15  Temp:  98.4 F (36.9 C) 98.6 F (37 C) 98 F (36.7 C)  TempSrc:  Oral Oral Oral  SpO2:  98% 97% 96%  Weight:  (!) 151.5 kg    Height:       Physical Exam General:Chronically ill appearing male in NAD Heart:S1,S2 RRR No M/G/R Lungs:slightly decreased in bases otherwise CTAB Abdomen:obese active BS, NT Extremities:No LE edema. Ulcers in various stages of healing on feet. Dialysis Access:RIJ Coleman drsg intact  Dialysis Orders:  Additional Objective Labs: Basic Metabolic Panel: Recent Labs  Lab 05/07/20 1633 05/08/20 0621 05/10/20 1426 05/12/20 0920 05/14/20 0324  NA 136   < > 133* 133* 133*  K 4.0   < > 5.0 4.6 3.6  CL 99   < > 99 98 97*  CO2 28   < > 25 27 28   GLUCOSE 219*   < > 225* 137* 157*  BUN 34*   < > 39* 34* 19  CREATININE 5.10*   < > 4.93* 4.41* 3.04*  CALCIUM 7.4*   < > 8.0* 7.8* 8.2*  PHOS 3.3  --  5.1* 4.1  --    < > = values in this interval not displayed.   Liver Function Tests: Recent Labs  Lab 05/08/20 0621 05/10/20 1426 05/12/20 0920  AST 18  --   --   ALT 18  --   --   ALKPHOS 274*  --   --   BILITOT 0.6  --   --   PROT 5.6*  --   --   ALBUMIN 1.5* 1.4* 1.4*   No results for input(s): LIPASE, AMYLASE in the last 168 hours. CBC: Recent Labs  Lab 05/08/20 0621 05/10/20 1426 05/12/20 0920 05/13/20 1009 05/14/20 0324  WBC 9.3 7.1 7.3 6.7 8.0  NEUTROABS  --   --   --   --  4.8  HGB 8.5* 8.2* 8.1* 7.7* 8.5*  HCT 28.1* 27.5* 26.6* 25.9* 26.7*  MCV 84.9 86.8 86.6 87.8 85.0  PLT  174 183 194 175 186   Blood Culture    Component Value Date/Time   SDES BLOOD SITE NOT SPECIFIED 04/29/2020 1210   SPECREQUEST  04/29/2020 1210    BOTTLES DRAWN AEROBIC AND ANAEROBIC Blood Culture adequate volume   CULT  04/29/2020 1210    NO GROWTH 5 DAYS Performed at [facility] [address], [place], [place] [address]    REPTSTATUS 05/04/2020 FINAL 04/29/2020 1210    Cardiac Enzymes: No results for input(s): CKTOTAL, CKMB, CKMBINDEX, TROPONINI in the last 168 hours. CBG: Recent Labs  Lab 05/13/20 0649 05/13/20 1321 05/13/20 1634 05/13/20 2038 05/14/20 0651  GLUCAP 141* 115* 190* 291* 126*   Iron Studies: No results for input(s): IRON, TIBC, TRANSFERRIN, FERRITIN in the last 72 hours. @lablastinr3 @ Studies/Results: No results found. Medications: . ceFEPime (MAXIPIME) IV 1 g (05/13/20 2302)  . [START ON 05/18/2020] ceFEPime (MAXIPIME) IV    . [START ON 05/16/2020] vancomycin    . [  START ON 05/16/2020] vancomycin     . (feeding supplement) PROSource Plus  30 mL Oral BID BM  . amLODipine  10 mg Oral Daily  . Chlorhexidine Gluconate Cloth  6 each Topical Q0600  . collagenase   Topical BID  . [START ON 05/20/2020] darbepoetin (ARANESP) injection - DIALYSIS  100 mcg Intravenous Q Thu-HD  . famotidine  20 mg Oral QHS  . feeding supplement (NEPRO CARB STEADY)  237 mL Oral BID BM  . heparin injection (subcutaneous)  5,000 Units Subcutaneous Q8H  . insulin aspart  0-5 Units Subcutaneous QHS  . insulin aspart  0-9 Units Subcutaneous TID WC  . insulin glargine  10 Units Subcutaneous QHS  . metoCLOPramide  5 mg Oral TID AC  . metoprolol tartrate  50 mg Oral BID  . multivitamin  1 tablet Oral QHS  . senna  1 tablet Oral BID  . sodium chloride flush  3 mL Intravenous Q12H     OP UR:KYHCW (on COVID shift) , was TTS 4h 400/800 143.5kg 3K/2.5Ca TDC -Heparin3000units IV TIW -Mircera118mcgIVq2wks - last 12/8 -Venofer 100mg IVqHD x10 - completed  6/10 doses  Assessment/ Plan: 1. Sacral decub/ coccygealosteo-per ID cont vanc/ maxipime, D#9. Continues hydrotherapy. 6 wks total IV abx thru 06/10/20 per ID. 2. Osteomyelitis L foot/ pressure injury bilat heels -per ID cont vanc/ maxipime. PT/OT recommending CIR. 3. COVID PNA- tested positive on 11/30, is off isolation now. 4. ESRD-Schedule changed for COVID. HD today on schedule. Next HD on Holiday schedule 05/16/2020.  5. Hypertension/volume-HD 12/23 Net UF 2.5 post wt 151.5 kg. No excess volume by exam. Continue to UF as tolerated.  6. Anemiaof CKD- Hgb 8.5 today.Started weekly Aranesp 100 mcg IV 12/22.Change to T,Th,S schedule for next week. 7. Secondary Hyperparathyroidism -CCa in goal. Phos improved to4.1 S/Psupplementation. Not on VDRA or binders.Labs pending. 8. Nutrition-alb 1.4Change to renal/Carb mod diet with fluid restrictions as K+ 5.0. Continueprosourceand Nepro supplement  9. DM T 2 - per admit  Nisha Dhami H. Rio Kidane NP-C 05/14/2020, 10:58 AM  [employer] [phone]

## 2020-05-14 NOTE — Plan of Care (Signed)
  Problem: Pain Managment: Goal: General experience of comfort will improve Outcome: Progressing   Problem: Safety: Goal: Ability to remain free from injury will improve Outcome: Progressing   Problem: Skin Integrity: Goal: Risk for impaired skin integrity will decrease Outcome: Progressing   

## 2020-05-14 NOTE — Progress Notes (Signed)
Physical Therapy Wound Treatment [name] MRN: [id number] Date of Birth: 03-Nov-1970  Today's Date: 05/14/2020 Time: 7253-6644 Time Calculation (min): 49 min  Subjective  Subjective: Agreeable to hydrotherapy. Patient and Family Stated Goals: I really want to get healed up. Date of Onset:  (prior to admission) Prior Treatments: Dressing changes  Pain Score:  Pt tolerated treatment without complaints of pain and no pre medication.  Wound Assessment  Pressure Injury 05/01/20 Buttocks Left Stage 4 - Full thickness tissue loss with exposed bone, tendon or muscle. packed with santyl gauze, dressed by WOCN, (Active)  Dressing Type ABD;Barrier Film (skin prep);Gauze (Comment);Moist to dry 05/14/20 1106  Dressing Changed;Clean;Dry;Intact 05/14/20 1106  Dressing Change Frequency Daily 05/14/20 1106  State of Healing Early/partial granulation 05/14/20 1106  Site / Wound Assessment Pink;Yellow 05/14/20 1106  % Wound base Red or Granulating 60% 05/14/20 1106  % Wound base Yellow/Fibrinous Exudate 40% 05/14/20 1106  % Wound base Black/Eschar 0% 05/14/20 1106  % Wound base Other/Granulation Tissue (Comment) 0% 05/14/20 1106  Peri-wound Assessment Intact;Pink 05/14/20 1106  Wound Length (cm) 6 cm 05/11/20 1400  Wound Width (cm) 6.5 cm 05/11/20 1400  Wound Depth (cm) 3.8 cm 05/11/20 1400  Wound Surface Area (cm^2) 39 cm^2 05/11/20 1400  Wound Volume (cm^3) 148.2 cm^3 05/11/20 1400  Tunneling (cm) 0 05/11/20 1442  Undermining (cm) 5.5 cm at 11-12:00, 6.1 cm at 9:00, 5 cm at 8:00, 05/11/20 1400  Margins Unattached edges (unapproximated) 05/14/20 1106  Drainage Amount Moderate 05/14/20 1106  Drainage Description Serosanguineous 05/14/20 1106  Treatment Debridement (Selective);Hydrotherapy (Pulse lavage);Packing (Saline gauze) 05/14/20 1106   Santyl applied to wound bed prior to applying dressing.     Hydrotherapy Pulsed lavage therapy - wound location: L buttock  wound Pulsed Lavage with Suction (psi): 12 psi Pulsed Lavage with Suction - Normal Saline Used: 1000 mL Pulsed Lavage Tip: Tip with splash shield Selective Debridement Selective Debridement - Location: buttock wound Selective Debridement - Tools Used: Forceps;Scissors Selective Debridement - Tissue Removed: yellow unviable tissue   Wound Assessment and Plan  Wound Therapy - Assess/Plan/Recommendations Wound Therapy - Clinical Statement: Qound bed continues to improve. Minimal slough debrided with moderate bleeding noted, requiring use of silver nitrate to control. The remaining yellow tissue is adherent and feel this patient is appropriate for decrease in frequency to 3x/week. We will follow opposite HD schedule. Hydrotherapy will continue to follow for selective removal of unviable tissue, to decrease bioburden and promote wound bed healing. Wound Therapy - Functional Problem List: decreased mobility Factors Delaying/Impairing Wound Healing: Diabetes Mellitus;Altered sensation;Immobility Hydrotherapy Plan: Debridement;Dressing change;Patient/family education;Pulsatile lavage with suction Wound Therapy - Frequency: 3X / week Wound Therapy - Current Recommendations: PT Wound Therapy - Follow Up Recommendations: Skilled nursing facility Wound Plan: see above  Wound Therapy Goals- Improve the function of patient's integumentary system by progressing the wound(s) through the phases of wound healing (inflammation - proliferation - remodeling) by: Decrease Necrotic Tissue to: 15% Decrease Necrotic Tissue - Progress: Progressing toward goal Increase Granulation Tissue to: 85% Increase Granulation Tissue - Progress: Progressing toward goal Improve Drainage Characteristics: Min;Serous Goals/treatment plan/discharge plan were made with and agreed upon by patient/family: Yes Time For Goal Achievement: 7 days Wound Therapy - Potential for Goals: Good  Goals will be updated until maximal potential  achieved or discharge criteria met.  Discharge criteria: when goals achieved, discharge from hospital, MD decision/surgical intervention, no progress towards goals, refusal/missing three consecutive treatments without notification or medical reason.  GP  [name] 05/14/2020, 1:15 PM   [name], PT, DPT Acute Rehabilitation Services Pager: [phone] Office: [phone]

## 2020-05-14 NOTE — Plan of Care (Signed)
Patient is awaiting CIR placement at this time. IV antibx being infused as ordered. No complaints of pain at all this shift. Refuses prevalon boots and I changed dsg on left foot to cover toe wounds. No heel wound present to left foot and small spot on left ankle is healing. Dsg to right foot clean dry and intact. No apparent distress or needs voiced. Will continue to monitor and continue current POC.

## 2020-05-14 NOTE — Progress Notes (Addendum)
Occupational Therapy Treatment Patient Details Name: Fred [name] MRN: [id number] DOB: [birth year] Today's Date: 05/14/2020    History of present illness Pt is a 49 y.o. male admitted from [facility] SN[facility] on 04/29/20 for evaluation of low oxygen saturations 82% on room air; concern for sepsis form Covid PNA, sacral wound and foot wounds. 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 Of note, pt recently admitted to [facility] due to gallbladder problems then d/c to [facility](14 days) for rehab then readmitted to [facility] then d/c to [facility] for rehab. PMH includes ESRD on HD (started 9/21), HTN, DM, C. diff.   OT comments  Treatment focused on exercises in supine and at edge of bed to improve strength and endurance. Patient's BP initially 101/75 in supine. Patient performed supine exercises of upper and lower extremities and BP 127/88. Patient transferred to side of bed and BP 112/98.  Patient performed knee extension, reaching, leaning and modified sit ups at side of bed to improve sitting balance and core strength needed for ADLs and functional mobility. Patient pleasant and motivated to improve strength and independence. Patient attempted to don socks in bed but limited by hip ROM and continues to be total assistance for pericare due to incontinence. Continue to recommend aggressive short term rehab at discharge.     Follow Up Recommendations  CIR;Supervision/Assistance - 24 hour    Equipment Recommendations  3 in 1 bedside commode;Wheelchair (measurements OT);Wheelchair cushion (measurements OT);Hospital bed    Recommendations for Other Services      Precautions / Restrictions Precautions Precautions: Fall Precaution Comments: Multiple wounds; (+) orthostatic hypotension with syncope 12/16 Required Braces or Orthoses: Other Brace Other Brace: B prevalon boots (RN notified pt c/o redness to L lateral heel aggravated by  boots, RN to assess and left boots off) Restrictions Weight Bearing Restrictions: No       Mobility Bed Mobility Overal bed mobility: Needs Assistance Bed Mobility: Rolling;Sidelying to Sit;Sit to Supine Rolling: Supervision Sidelying to sit: Max assist     Sit to sidelying: Mod assist;HOB elevated General bed mobility comments: Required mod assist for trunk lift off and assist to pivot pelvis with use bed pad. Patient able to use bed rails to assist with transfer. Patient min assist for LEs to return to supine.  Transfers                      Balance Overall balance assessment: Needs assistance Sitting-balance support: No upper extremity supported;Feet supported Sitting balance-Leahy Scale: Fair Sitting balance - Comments: patinet reaching out of base of support up and laterally                                   ADL either performed or assessed with clinical judgement   ADL Overall ADL's : Needs assistance/impaired                     Lower Body Dressing: Maximal assistance;Bed level Lower Body Dressing Details (indicate cue type and reason): Max assist to don socks - patient using figure 4 position in bed to attempt to get socks on but limited by hip ROM.     Toileting- Clothing Manipulation and Hygiene: Total assistance Toileting - Clothing Manipulation Details (indicate cue type and reason): Total assist for pericare in bed.             Vision Patient  Visual Report: No change from baseline     Perception     Praxis      Cognition Arousal/Alertness: Awake/alert Behavior During Therapy: WFL for tasks assessed/performed Overall Cognitive Status: Within Functional Limits for tasks assessed                                          Exercises Other Exercises Other Exercises: Supine: Straight Leg Raises x 10 each leg, Shoulder Flexion x 10 each arm with light weight. Other Exercises: Sitting EOB: Knee extension x  10 each leg, shoulder flexion x 10 each arm, lateral reaching x 10 each arm. Other Exercises: Modified sit up at EOB x 10 (from 45 degrees and with therapist stabilizing lower extremities)   Shoulder Instructions       General Comments      Pertinent Vitals/ Pain       Pain Assessment: No/denies pain  Home Living                                          Prior Functioning/Environment              Frequency  Min 3X/week        Progress Toward Goals  OT Goals(current goals can now be found in the care plan section)  Progress towards OT goals: Progressing toward goals  Acute Rehab OT Goals Patient Stated Goal: Heal wounds; be able to walk outside OT Goal Formulation: With patient Time For Goal Achievement: 05/18/20 Potential to Achieve Goals: Good  Plan Discharge plan remains appropriate    Co-evaluation          OT goals addressed during session: Strengthening/ROM      AM-PAC OT "6 Clicks" Daily Activity     Outcome Measure   Help from another person eating meals?: A Little Help from another person taking care of personal grooming?: A Little Help from another person toileting, which includes using toliet, bedpan, or urinal?: Total Help from another person bathing (including washing, rinsing, drying)?: A Lot Help from another person to put on and taking off regular upper body clothing?: A Little Help from another person to put on and taking off regular lower body clothing?: A Lot 6 Click Score: 14    End of Session    OT Visit Diagnosis: Other abnormalities of gait and mobility (R26.89);Muscle weakness (generalized) (M62.81);Other symptoms and signs involving cognitive function;Dizziness and giddiness (R42)   Activity Tolerance Patient tolerated treatment well   Patient Left in bed;with call bell/phone within reach;with bed alarm set   Nurse Communication Mobility status        Time: 1345-1416 OT Time Calculation (min): 31  min  Charges: OT General Charges $OT Visit: 1 Visit OT Treatments $Therapeutic Exercise: 23-37 mins  Donald Jacque, OTR/L Friend  Office [phone] Pager: [place] 05/14/2020, 2:25 PM

## 2020-05-15 LAB — GLUCOSE, CAPILLARY
Glucose-Capillary: 159 mg/dL — ABNORMAL HIGH (ref 70–99)
Glucose-Capillary: 183 mg/dL — ABNORMAL HIGH (ref 70–99)
Glucose-Capillary: 181 mg/dL — ABNORMAL HIGH (ref 70–99)
Glucose-Capillary: 154 mg/dL — ABNORMAL HIGH (ref 70–99)

## 2020-05-15 NOTE — Plan of Care (Signed)
No acute changes since the previous night that I took care of him. Will continue to monitor and continue current POC. 

## 2020-05-15 NOTE — Progress Notes (Signed)
   Subjective:   No acute overnight events.  Reports feeling good today. No complaints or concerns at this time. Still awaiting CIR bed availability.   Objective:  Vital signs in last 24 hours: Vitals:   05/14/20 0900 05/14/20 1300 05/14/20 1955 05/15/20 0428  BP: 126/78 129/68 111/83 (!) 147/72  Pulse: 88 80 98 96  Resp: 17  17 18   Temp: 98 F (36.7 C) 98.7 F (37.1 C) 98.7 F (37.1 C) 98 F (36.7 C)  TempSrc: Oral Oral Oral   SpO2: 100% 96% 100% 100%  Weight:      Height:       Physical Exam: General: obese male, lying in bed, NAD. CV: normal rate and regular rhythm, heart sounds normal, no m/r/g Pulm: CTABL, no adventitious sounds noted Abdomen: soft, nontender, nondistended. +BS Skin: warm and dry. Coccygeal and bilateral feet wounds wrapped in dry gauze.   Assessment/Plan: Mr. Balling is 49yo male with ESRD, hypertension, insulin-depending type II diabetes mellitus admitted 04/29/20 for sacral decubitus ulcer and COVID-19 pneumonia, continuing to progress well on IV antibiotics, awaiting CIR bed placement.   Principal Problem:   Sacral osteomyelitis ([place]) Active Problems:   Insulin dependent type 2 diabetes mellitus (HCC)   HTN (hypertension)   History of anemia due to chronic kidney disease   Pressure injury of both heels, unstageable ([place])   Pneumonia due to COVID-19 virus   ESRD (end stage renal disease) (HCC)   Avascular necrosis of first metatarsal, left foot (HCC)   Osteomyelitis of dorsal first metatarsal, left foot (HCC)   Decubitus ulcer of sacral region, unstageable ([place])   Morbid obesity (HCC)  #Unstageable sacral decubitus ulcer #Coccygeal osteomyelitis Continue with long-term antibiotics for osteomyelitis. Will continue wound care per WOCN. Awaiting bed placement per CIR. - continue vanc/cefepime (end date 06/10/20) - continue hydrotherapy - pain management with tylenol q6h prn and percocet q4h prn - Air mattress, off-loading sacral wound -  Wound care per WOCN - PT/OT - CIR once bed available  #Unstageable pressure wounds (L lateral foot, lateral malleolus; R lateral foot, R heel) #Dry gangrene L 4th toe #Osteomyelitis L first metatarsal, R 5th metatarsal L lateral foot wound with fibrinous exudate. - C/w antibiotics - C/w dressings, Prevalon boots - wound care  #ESRD on HD Nephrology following, appreciate recs. Patient now on schedule for HD on TTS. - Next HD on 12/26 (holiday schedule)  #Type II diabetes mellitus AM CBG 141. Continues to be controlled with regimen. No changes at this time. - C/w Lantus 10u, SSI - CBG monitoring TID, QHS  #Hypertension Controlled with current home regimen. SBPs 120s-130s. - C/w metoprolol tartrate 25mg  BID, amlodipine 10mg  daily  #Deconditioning #Generalized weakness Patient to continue working with PT/OT, waiting for bed placement with CIR. - continue feeding supplements for nutrition - PT/OT - Pending CIR placement  DIET: CM IVF: n/a DVT PPX: subQ heparin BOWEL: Senokot CODE: FULL FAM COM: n/a  Prior to Admission Living Arrangement: SNF Anticipated Discharge Location: CIR Barriers to Discharge: CIR bed availability Dispo: Anticipated discharge in approximately 1-2 day(s).   Virl Axe, MD 05/15/2020, 6:42 AM Pager: [phone] After 5pm on weekdays and 1pm on weekends: On Call pager [phone]

## 2020-05-15 NOTE — Progress Notes (Addendum)
[facility] Progress Note   Subjective: No C/Os. HD tomorrow on schedule.  Objective Vitals:   05/14/20 0900 05/14/20 1300 05/14/20 1955 05/15/20 0428  BP: 126/78 129/68 111/83 (!) 147/72  Pulse: 88 80 98 96  Resp: 17  17 18   Temp: 98 F (36.7 C) 98.7 F (37.1 C) 98.7 F (37.1 C) 98 F (36.7 C)  TempSrc: Oral Oral Oral   SpO2: 100% 96% 100% 100%  Weight:      Height:       Physical Exam General:Chronically ill appearing male in NAD Heart:S1,S2 RRR No M/G/R Lungs:slightly decreased in bases otherwise CTAB Abdomen:obese active BS, NT Extremities:No LE edema.Ulcers in various stages of healing on feet. Dialysis Access:RIJ TDC drsg intact   Additional Objective Labs: Basic Metabolic Panel: Recent Labs  Lab 05/10/20 1426 05/12/20 0920 05/14/20 0324  NA 133* 133* 133*  K 5.0 4.6 3.6  CL 99 98 97*  CO2 25 27 28   GLUCOSE 225* 137* 157*  BUN 39* 34* 19  CREATININE 4.93* 4.41* 3.04*  CALCIUM 8.0* 7.8* 8.2*  PHOS 5.1* 4.1  --    Liver Function Tests: Recent Labs  Lab 05/10/20 1426 05/12/20 0920  ALBUMIN 1.4* 1.4*   No results for input(s): LIPASE, AMYLASE in the last 168 hours. CBC: Recent Labs  Lab 05/10/20 1426 05/12/20 0920 05/13/20 1009 05/14/20 0324  WBC 7.1 7.3 6.7 8.0  NEUTROABS  --   --   --  4.8  HGB 8.2* 8.1* 7.7* 8.5*  HCT 27.5* 26.6* 25.9* 26.7*  MCV 86.8 86.6 87.8 85.0  PLT 183 194 175 186   Blood Culture    Component Value Date/Time   SDES BLOOD SITE NOT SPECIFIED 04/29/2020 1210   SPECREQUEST  04/29/2020 1210    BOTTLES DRAWN AEROBIC AND ANAEROBIC Blood Culture adequate volume   CULT  04/29/2020 1210    NO GROWTH 5 DAYS Performed at [facility], [place] [address], [place], [place] [address]    REPTSTATUS 05/04/2020 FINAL 04/29/2020 1210    Cardiac Enzymes: No results for input(s): CKTOTAL, CKMB, CKMBINDEX, TROPONINI in the last 168 hours. CBG: Recent Labs  Lab 05/14/20 0651 05/14/20 1224  05/14/20 1734 05/14/20 1953 05/15/20 0636  GLUCAP 126* 139* 172* 188* 159*   Iron Studies: No results for input(s): IRON, TIBC, TRANSFERRIN, FERRITIN in the last 72 hours. @lablastinr3 @ Studies/Results: No results found. Medications: . ceFEPime (MAXIPIME) IV 1 g (05/14/20 2153)  . [START ON 05/18/2020] ceFEPime (MAXIPIME) IV    . [START ON 05/16/2020] vancomycin    . [START ON 05/16/2020] vancomycin     . (feeding supplement) PROSource Plus  30 mL Oral BID BM  . amLODipine  10 mg Oral Daily  . Chlorhexidine Gluconate Cloth  6 each Topical Q0600  . collagenase   Topical BID  . [START ON 05/20/2020] darbepoetin (ARANESP) injection - DIALYSIS  100 mcg Intravenous Q Thu-HD  . famotidine  20 mg Oral QHS  . feeding supplement (NEPRO CARB STEADY)  237 mL Oral BID BM  . heparin injection (subcutaneous)  5,000 Units Subcutaneous Q8H  . insulin aspart  0-5 Units Subcutaneous QHS  . insulin aspart  0-9 Units Subcutaneous TID WC  . insulin glargine  10 Units Subcutaneous QHS  . metoCLOPramide  5 mg Oral TID AC  . metoprolol tartrate  50 mg Oral BID  . multivitamin  1 tablet Oral QHS  . senna  1 tablet Oral BID  . sodium chloride flush  3  mL Intravenous Q12H     OP PP:JKDTO (on COVID shift) , was TTS 4h 400/800 143.5kg 3K/2.5Ca TDC -Heparin3000units IV TIW -Mircera119mcgIVq2wks - last 12/8 -Venofer 100mg IVqHD x10 - completed 6/10 doses  Assessment/ Plan: 1. Sacral decub/ coccygealosteo-per ID cont vanc/ maxipime, D#9. Continues hydrotherapy. 6 wks total IV abx thru 06/10/20 per ID. 2. Osteomyelitis L foot/ pressure injury bilat heels -per ID cont vanc/ maxipime. PT/OT recommending CIR. 3. COVID PNA- tested positive on 11/30, is off isolation now. 4. ESRD-Schedule changed for COVID. Next HD on Holiday schedule 05/16/2020. 5. Hypertension/volume-HD 12/23 Net UF 2.5 post wt 151.5 kg. No excess volume by exam. Continue to UF as tolerated.   6. Anemiaof CKD- Hgb8.5 12/24.Startedweekly Aranesp 100 mcg IV12/22.Change to T,Th,S schedule for next week. 7. Secondary Hyperparathyroidism -CCa in goal. Phos improved to4.1 S/Psupplementation. Not on VDRA or binders.Labs pending. 8. Nutrition-alb 1.4Change to renal/Carb mod diet with fluid restrictions as K+ 5.0. Continueprosourceand Nepro supplement  9. DM T 2 - per admit  Guiselle Mian H. Ronneisha Jett NP-C 05/15/2020, 9:49 AM  [employer] [phone]

## 2020-05-15 NOTE — Plan of Care (Signed)
Problem: Education: Goal: Knowledge of General Education information will improve Description Including pain rating scale, medication(s)/side effects and non-pharmacologic comfort measures Outcome: Progressing   Problem: Clinical Measurements: Goal: Will remain free from infection Outcome: Progressing   Problem: Activity: Goal: Risk for activity intolerance will decrease Outcome: Progressing   Problem: Skin Integrity: Goal: Risk for impaired skin integrity will decrease Outcome: Progressing   

## 2020-05-16 ENCOUNTER — Encounter (HOSPITAL_COMMUNITY): Payer: Self-pay | Admitting: Internal Medicine

## 2020-05-16 LAB — RENAL FUNCTION PANEL
Albumin: 1.5 g/dL — ABNORMAL LOW (ref 3.5–5.0)
Anion gap: 9 (ref 5–15)
BUN: 44 mg/dL — ABNORMAL HIGH (ref 6–20)
CO2: 26 mmol/L (ref 22–32)
Calcium: 8.1 mg/dL — ABNORMAL LOW (ref 8.9–10.3)
Chloride: 97 mmol/L — ABNORMAL LOW (ref 98–111)
Creatinine, Ser: 5.61 mg/dL — ABNORMAL HIGH (ref 0.61–1.24)
GFR, Estimated: 12 mL/min — ABNORMAL LOW (ref >60)
Glucose, Bld: 120 mg/dL — ABNORMAL HIGH (ref 70–99)
Phosphorus: 4.5 mg/dL (ref 2.5–4.6)
Potassium: 3.7 mmol/L (ref 3.5–5.1)
Sodium: 132 mmol/L — ABNORMAL LOW (ref 135–145)

## 2020-05-16 LAB — GLUCOSE, CAPILLARY
Glucose-Capillary: 129 mg/dL — ABNORMAL HIGH (ref 70–99)
Glucose-Capillary: 137 mg/dL — ABNORMAL HIGH (ref 70–99)
Glucose-Capillary: 286 mg/dL — ABNORMAL HIGH (ref 70–99)

## 2020-05-16 LAB — CBC
HCT: 27.2 % — ABNORMAL LOW (ref 39.0–52.0)
Hemoglobin: 8.2 g/dL — ABNORMAL LOW (ref 13.0–17.0)
MCH: 26.2 pg (ref 26.0–34.0)
MCHC: 30.1 g/dL (ref 30.0–36.0)
MCV: 86.9 fL (ref 80.0–100.0)
Platelets: 189 10*3/uL (ref 150–400)
RBC: 3.13 MIL/uL — ABNORMAL LOW (ref 4.22–5.81)
RDW: 18.6 % — ABNORMAL HIGH (ref 11.5–15.5)
WBC: 7.8 10*3/uL (ref 4.0–10.5)
nRBC: 0 % (ref 0.0–0.2)

## 2020-05-16 MED ORDER — SODIUM CHLORIDE 0.9 % IV SOLN: 100.0000 mL | INTRAVENOUS | Status: DC | PRN

## 2020-05-16 MED ORDER — LIDOCAINE HCL (PF) 1 % IJ SOLN: 5.0000 mL | INTRAMUSCULAR | Status: DC | PRN

## 2020-05-16 MED ORDER — HEPARIN SODIUM (PORCINE) 1000 UNIT/ML DIALYSIS
3000.0000 [IU] | INTRAMUSCULAR | Status: DC | PRN
Start: 2020-05-16 — End: 2020-05-16

## 2020-05-16 MED ORDER — HEPARIN SODIUM (PORCINE) 1000 UNIT/ML IJ SOLN
INTRAMUSCULAR | Status: AC
  Filled 2020-05-16: qty 4

## 2020-05-16 MED ORDER — PENTAFLUOROPROP-TETRAFLUOROETH EX AERO
1.0000 | INHALATION_SPRAY | CUTANEOUS | Status: DC | PRN
Start: 2020-05-16 — End: 2020-05-16

## 2020-05-16 MED ORDER — ALTEPLASE 2 MG IJ SOLR: 2.0000 mg | Freq: Once | INTRAMUSCULAR | Status: DC | PRN

## 2020-05-16 MED ORDER — LIDOCAINE-PRILOCAINE 2.5-2.5 % EX CREA: 1.0000 "application " | TOPICAL_CREAM | CUTANEOUS | Status: DC | PRN

## 2020-05-16 MED ORDER — VANCOMYCIN HCL IN DEXTROSE 1-5 GM/200ML-% IV SOLN
INTRAVENOUS | Status: AC
  Administered 2020-05-16: 1000 mg via INTRAVENOUS
  Filled 2020-05-16: qty 200

## 2020-05-16 MED ORDER — HEPARIN SODIUM (PORCINE) 1000 UNIT/ML DIALYSIS: 1000.0000 [IU] | INTRAMUSCULAR | Status: DC | PRN

## 2020-05-16 MED ORDER — SODIUM CHLORIDE 0.9 % IV SOLN
100.0000 mL | INTRAVENOUS | Status: DC | PRN
Start: 2020-05-16 — End: 2020-05-16

## 2020-05-16 NOTE — Progress Notes (Signed)
Report called to Almyra Brace, dialysis RN

## 2020-05-16 NOTE — Progress Notes (Addendum)
Pharmacy Antibiotic Note- follow up   Fred [name] is a 49 y.o. male admitted on 04/29/2020 with sepsis.  Pharmacy has been consulted for vancomycin and cefepime dosing for sacral decubitus ulcer and coccygeal osteomyelitis.   Pre-HD vancomycin level was therapeutic at 18 mcg/mL (goal 15-25 mcg/mL) on 12/20.  Was on MWF HD schedule but back to TTS now that off isolation.  HD scheduled for today (12/26).  Plan: Vancomycin 1000 mg IV after HD today (12/26) Vancomycin 1000 mg IV qHD on TTS - begins 12/28 Cefepime 1 g IV q24h thru 12/27 Cefepime 2 g IV qHD on TTS - starting 12/28 Monitor HD schedule/tolerance, clinical progress, PRN vanc level (pre-dialysis vancomycin level ordered for 12/28)  ID plans to continue vancomycin/cefepime x 6 weeks, end date 06/11/19   Height: 6\' 6"  (198.1 cm) Weight: (!) 153 kg (337 lb 4.9 oz) IBW/kg (Calculated) : 91.4  Temp (24hrs), Avg:98.6 F (37 C), Min:98 F (36.7 C), Max:98.9 F (37.2 C)  Recent Labs  Lab 05/10/20 1226 05/10/20 1426 05/12/20 0920 05/13/20 1009 05/14/20 0324  WBC  --  7.1 7.3 6.7 8.0  CREATININE  --  4.93* 4.41*  --  3.04*  VANCORANDOM 18  --   --   --   --     Estimated Creatinine Clearance: 48.2 mL/min (A) (by C-G formula based on SCr of 3.04 mg/dL (H)).    Allergies  Allergen Reactions  . Atorvastatin Hives    NOT on MAR   Vanc 12/9>> (1/20) Cefepime 12/9>> (1/20)  Remdesivir 12/9>>12/14  12/20 VL = 18 mcg/mL on 1g qMWF >> no change  12/9 Bcx: negative  12/9: COVID Positive  Thank you for involving pharmacy in this patient's care.  Isola Mehlman P. Legrand Como, PharmD, [place] Please utilize Amion for appropriate phone number to reach the unit pharmacist ([place]) 05/16/2020 8:17 AM

## 2020-05-16 NOTE — Progress Notes (Signed)
   Subjective:   No acute overnight events.  Evaluated patient at bedside as he was undergoing hemodialysis. He reports doing well. Discussed ongoing wait for CIR bed availability. Patient was understanding about having to wait for bed. Will continue to try for CIR placement as soon as possible.  Otherwise medically stable.   Objective:  Vital signs in last 24 hours: Vitals:   05/15/20 1325 05/15/20 1918 05/16/20 0423 05/16/20 0500  BP: 105/74 133/89 140/67   Pulse: 90 97 90   Resp: 18 16 17    Temp: 98.9 F (37.2 C) 98.8 F (37.1 C) 98.6 F (37 C)   TempSrc:   Oral   SpO2: 99% 100% 98%   Weight:    (!) 153 kg  Height:       Physical Exam: General: obese male, lying in bed undergoing HD, NAD. CV: normal rate and regular rhythm, heart sounds normal, no m/r/g. Pulm: CTABL Abdomen: soft, nontender, nondistended. +BS Skin: warm and dry, coccygeal and bilateral feet wounds wrapped in dry gauze and c/d/i.   Assessment/Plan: [name] is 49yo male with ESRD, hypertension, insulin-depending type II diabetes mellitus admitted 04/29/20 for sacral decubitus ulcer and COVID-19 pneumonia, continuing to progress well on IV antibiotics, awaiting CIR bed placement.   Principal Problem:   Sacral osteomyelitis ([place]) Active Problems:   Insulin dependent type 2 diabetes mellitus (HCC)   HTN (hypertension)   History of anemia due to chronic kidney disease   Pressure injury of both heels, unstageable ([place])   Pneumonia due to COVID-19 virus   ESRD (end stage renal disease) (HCC)   Avascular necrosis of first metatarsal, left foot (HCC)   Osteomyelitis of dorsal first metatarsal, left foot (HCC)   Decubitus ulcer of sacral region, unstageable ([place])   Morbid obesity (HCC)  #Unstageable sacral decubitus ulcer #Coccygeal osteomyelitis Continue with long-term antibiotics for osteomyelitis. Will continue wound care per WOCN. Awaiting bed placement per CIR. - continue vanc/cefepime (end date  06/10/20) - continue hydrotherapy - pain management with tylenol q6h prn and percocet q4h prn - Air mattress, off-loading sacral wound - Wound care per WOCN - PT/OT - CIR once bed available  #Unstageable pressure wounds (L lateral foot, lateral malleolus; R lateral foot, R heel) #Dry gangrene L 4th toe #Osteomyelitis L first metatarsal, R 5th metatarsal L lateral foot wound with fibrinous exudate. - C/w antibiotics - C/w dressings, Prevalon boots - wound care  #ESRD on HD Nephrology following, appreciate recs. Patient now on schedule for HD on TTS. - Received HD today  #Type II diabetes mellitus AM CBG 141. Continues to be controlled with regimen. No changes at this time. - C/w Lantus 10u, SSI - CBG monitoring TID, QHS  #Hypertension Controlled with current home regimen. SBPs 120s-130s. - C/w metoprolol tartrate 25mg  BID, amlodipine 10mg  daily  #Deconditioning #Generalized weakness Patient to continue working with PT/OT, waiting for bed placement with CIR. - continue feeding supplements for nutrition - PT/OT - Pending CIR placement  DIET: CM IVF: n/a DVT PPX: subQ heparin BOWEL: Senokot CODE: FULL FAM COM: n/a  Prior to Admission Living Arrangement: SNF Anticipated Discharge Location: CIR Barriers to Discharge: CIR bed availability Dispo: Anticipated discharge in approximately 1-2 day(s).   Virl Axe, MD 05/16/2020, 6:32 AM Pager: [phone] After 5pm on weekdays and 1pm on weekends: On Call pager [phone]

## 2020-05-16 NOTE — Procedures (Signed)
Patient seen on Hemodialysis. BP 132/86   Pulse 90   Temp 98.4 F (36.9 C) (Oral)   Resp (!) 21   Ht 6\' 6"  (1.981 m)   Wt (!) 153 kg   SpO2 96%   BMI 38.98 kg/m   QB 400, UF goal 3.5L Tolerating treatment without complaints at this time.  Elmarie Shiley MD [facility]. Office # [phone] Pager # [phone] 10:26 AM

## 2020-05-16 NOTE — Progress Notes (Addendum)
[facility] Progress Note   Subjective:Asking if he can eat tomatoes. On HD no issues.   Objective Vitals:   05/16/20 0500 05/16/20 0800 05/16/20 0804 05/16/20 0832  BP:  (!) 157/91 (!) 163/89 (!) 126/92  Pulse:      Resp:  (!) 9 15 16   Temp:  98.4 F (36.9 C)    TempSrc:  Oral    SpO2:  96%    Weight: (!) 153 kg     Height:       Physical Exam General:Chronically ill appearing male in NAD Heart:S1,S2 RRR No M/G/R Lungs:slightly decreased in bases otherwise CTAB Abdomen:obese active BS, NT Extremities:No LE edema.Ulcers in various stages of healing on feet. Dialysis Access:RIJ TDC drsg intactblood lines connected. Dialysis Orders:  Additional Objective Labs: Basic Metabolic Panel: Recent Labs  Lab 05/10/20 1426 05/12/20 0920 05/14/20 0324 05/16/20 0829  NA 133* 133* 133* 132*  K 5.0 4.6 3.6 3.7  CL 99 98 97* 97*  CO2 25 27 28 26   GLUCOSE 225* 137* 157* 120*  BUN 39* 34* 19 44*  CREATININE 4.93* 4.41* 3.04* 5.61*  CALCIUM 8.0* 7.8* 8.2* 8.1*  PHOS 5.1* 4.1  --  4.5   Liver Function Tests: Recent Labs  Lab 05/10/20 1426 05/12/20 0920 05/16/20 0829  ALBUMIN 1.4* 1.4* 1.5*   No results for input(s): LIPASE, AMYLASE in the last 168 hours. CBC: Recent Labs  Lab 05/10/20 1426 05/12/20 0920 05/13/20 1009 05/14/20 0324  WBC 7.1 7.3 6.7 8.0  NEUTROABS  --   --   --  4.8  HGB 8.2* 8.1* 7.7* 8.5*  HCT 27.5* 26.6* 25.9* 26.7*  MCV 86.8 86.6 87.8 85.0  PLT 183 194 175 186   Blood Culture    Component Value Date/Time   SDES BLOOD SITE NOT SPECIFIED 04/29/2020 1210   SPECREQUEST  04/29/2020 1210    BOTTLES DRAWN AEROBIC AND ANAEROBIC Blood Culture adequate volume   CULT  04/29/2020 1210    NO GROWTH 5 DAYS Performed at [facility], [place] [address] [place], [place] [address]    REPTSTATUS 05/04/2020 FINAL 04/29/2020 1210    Cardiac Enzymes: No results for input(s): CKTOTAL, CKMB, CKMBINDEX, TROPONINI in the last 168  hours. CBG: Recent Labs  Lab 05/15/20 0636 05/15/20 1131 05/15/20 1627 05/15/20 1917 05/16/20 0658  GLUCAP 159* 183* 181* 154* 129*   Iron Studies: No results for input(s): IRON, TIBC, TRANSFERRIN, FERRITIN in the last 72 hours. @lablastinr3 @ Studies/Results: No results found. Medications: . sodium chloride    . sodium chloride    . ceFEPime (MAXIPIME) IV 1 g (05/15/20 2050)  . [START ON 05/18/2020] ceFEPime (MAXIPIME) IV    . vancomycin    . vancomycin     . (feeding supplement) PROSource Plus  30 mL Oral BID BM  . amLODipine  10 mg Oral Daily  . Chlorhexidine Gluconate Cloth  6 each Topical Q0600  . collagenase   Topical BID  . [START ON 05/20/2020] darbepoetin (ARANESP) injection - DIALYSIS  100 mcg Intravenous Q Thu-HD  . famotidine  20 mg Oral QHS  . feeding supplement (NEPRO CARB STEADY)  237 mL Oral BID BM  . heparin injection (subcutaneous)  5,000 Units Subcutaneous Q8H  . insulin aspart  0-5 Units Subcutaneous QHS  . insulin aspart  0-9 Units Subcutaneous TID WC  . insulin glargine  10 Units Subcutaneous QHS  . metoCLOPramide  5 mg Oral TID AC  . metoprolol tartrate  50 mg Oral BID  .  multivitamin  1 tablet Oral QHS  . senna  1 tablet Oral BID  . sodium chloride flush  3 mL Intravenous Q12H     OP IC:HTVGV (on COVID shift) , was TTS 4h 400/800 143.5kg 3K/2.5Ca TDC -Heparin3000units IV TIW -Mircera129mcgIVq2wks - last 12/8 -Venofer 100mg IVqHD x10 - completed 6/10 doses  Assessment/ Plan: 1. Sacral decub/ coccygealosteo-per ID cont vanc/ maxipime, D#9. Continues hydrotherapy. 6 wks total IV abx thru 06/10/20 per ID. 2. Osteomyelitis L foot/ pressure injury bilat heels -per ID cont vanc/ maxipime. PT/OT recommending CIR. 3. COVID PNA- tested positive on 11/30, is off isolation now. 4. ESRD-Schedule changed for COVID.HD today on T, Th, S schedule.\ 5.  Hypertension/volume-No excess volume by exam. Continue to UF as  tolerated. 6. Anemiaof CKD- Hgb8.5 12/24.Startedweekly Aranesp 100 mcg IV12/22.Change to T,Th,S schedule for next week. 7. Secondary Hyperparathyroidism -CCa in goal. Phos improved to4.1 S/Psupplementation. Not on VDRA or binders.Labs pending. 8. Nutrition-alb 1.4K+ controlled. Change to carb mod diet with fluid restrictions. Continueprosourceand Nepro supplement  9. DM T 2 - per admit  Ailynn Gow H. Tian Mcmurtrey NP-C 05/16/2020, 9:40 AM  [employer] [phone]

## 2020-05-16 NOTE — Plan of Care (Signed)
No acute changes since the previous night that I took care of him. Awaiting CIR placement. NAD or needs voiced, will continue to monitor and continue current POC.

## 2020-05-17 LAB — GLUCOSE, CAPILLARY
Glucose-Capillary: 200 mg/dL — ABNORMAL HIGH (ref 70–99)
Glucose-Capillary: 179 mg/dL — ABNORMAL HIGH (ref 70–99)
Glucose-Capillary: 264 mg/dL — ABNORMAL HIGH (ref 70–99)
Glucose-Capillary: 247 mg/dL — ABNORMAL HIGH (ref 70–99)

## 2020-05-17 NOTE — Progress Notes (Signed)
   Subjective:   No acute overnight events.  Patient reports feeling good. No new updates today. Still awaiting CIR bed availability.   Objective:  Vital signs in last 24 hours: Vitals:   05/16/20 1211 05/16/20 1559 05/16/20 1926 05/17/20 0321  BP: (!) 164/89 130/72 132/87 109/71  Pulse:  93 95 87  Resp: 17 18 16 14   Temp: 98.1 F (36.7 C) 98.7 F (37.1 C) 99.4 F (37.4 C) 98.5 F (36.9 C)  TempSrc: Oral Oral Oral Oral  SpO2: 96% 100% 98% 99%  Weight:      Height:       Physical Exam: General: obese male, lying in bed, NAD. CV: normal rate and regular rhythm, no m/r/g Pulm: CTABL Abdomen: soft, nontender, nondistended, +BS Skin: warm and dry, coccygeal and bilateral feet wounds wrapped in dry gauze and c/d/i.   Assessment/Plan: Fred [name] is 49yo male with ESRD, hypertension, insulin-depending type II diabetes mellitus admitted 04/29/20 for sacral decubitus ulcer and COVID-19 pneumonia, continuing to progress well on IV antibiotics, awaiting CIR bed placement.   Principal Problem:   Sacral osteomyelitis ([place]) Active Problems:   Insulin dependent type 2 diabetes mellitus (HCC)   HTN (hypertension)   History of anemia due to chronic kidney disease   Pressure injury of both heels, unstageable ([place])   Pneumonia due to COVID-19 virus   ESRD (end stage renal disease) (HCC)   Avascular necrosis of first metatarsal, left foot (HCC)   Osteomyelitis of dorsal first metatarsal, left foot (HCC)   Decubitus ulcer of sacral region, unstageable ([place])   Morbid obesity (HCC)  #Unstageable sacral decubitus ulcer #Coccygeal osteomyelitis Continue with long-term antibiotics for osteomyelitis. Will continue wound care per WOCN. Awaiting bed placement per CIR. - continue vanc/cefepime (end date 06/10/20) - continue hydrotherapy - pain management with tylenol q6h prn and percocet q4h prn - Air mattress, off-loading sacral wound - Wound care per WOCN - PT/OT - CIR once bed  available  #Unstageable pressure wounds (L lateral foot, lateral malleolus; R lateral foot, R heel) #Dry gangrene L 4th toe #Osteomyelitis L first metatarsal, R 5th metatarsal L lateral foot wound with fibrinous exudate. - C/w antibiotics - C/w dressings, Prevalon boots - wound care  #ESRD on HD Nephrology following, appreciate recs. Patient now on schedule for HD on TTS. - Received HD today  #Type II diabetes mellitus CBG last night 286 and this AM 200, however CBGs been stable previously so will continue to monitor for now. Can adjust medications if continues to be elevated. - C/w Lantus 10u, SSI - CBG monitoring TID, QHS  #Hypertension Controlled with current home regimen. SBPs 120s-130s. - C/w metoprolol tartrate 25mg  BID, amlodipine 10mg  daily  #Deconditioning #Generalized weakness Patient to continue working with PT/OT, waiting for bed placement with CIR. - continue feeding supplements for nutrition - PT/OT - Pending CIR placement  DIET: CM IVF: n/a DVT PPX: subQ heparin BOWEL: Senokot CODE: FULL FAM COM: n/a  Prior to Admission Living Arrangement: SNF Anticipated Discharge Location: CIR Barriers to Discharge: CIR bed availability Dispo: Anticipated discharge in approximately 1-2 day(s).   Fred Axe, MD 05/17/2020, 6:48 AM Pager: [phone] After 5pm on weekdays and 1pm on weekends: On Call pager [phone]

## 2020-05-17 NOTE — Progress Notes (Signed)
Inpatient Rehab Admissions Coordinator:   I have no beds available for this patient to admit to CIR today.  Will continue to follow for timing of potential admission pending bed availability, hopefully in the next 1-2 days.  I updated his sister, [name], over the phone.   Shann Medal, PT, DPT Admissions Coordinator [phone] 05/17/20  9:34 AM

## 2020-05-17 NOTE — Progress Notes (Signed)
[facility] Progress Note   Subjective:  Seen in room - no overnight CP or dyspnea. Still with pain d/t sacral wound. Per notes, unable to admit to CIR today, but hopefully soon.  Objective Vitals:   05/16/20 1559 05/16/20 1926 05/17/20 0321 05/17/20 0828  BP: 130/72 132/87 109/71 119/84  Pulse: 93 95 87 82  Resp: 18 16 14 18   Temp: 98.7 F (37.1 C) 99.4 F (37.4 C) 98.5 F (36.9 C) 98.4 F (36.9 C)  TempSrc: Oral Oral Oral Oral  SpO2: 100% 98% 99% 98%  Weight:      Height:       Physical Exam General: Well appearing man, NAD. Laying on side in bed Heart: RRR; no murmur Lungs: CTAB Abdomen: soft Extremities: No LE edema; foot wounds not examined Dialysis Access: R [facility]  Additional Objective Labs: Basic Metabolic Panel: Recent Labs  Lab 05/10/20 1426 05/12/20 0920 05/14/20 0324 05/16/20 0829  NA 133* 133* 133* 132*  K 5.0 4.6 3.6 3.7  CL 99 98 97* 97*  CO2 25 27 28 26   GLUCOSE 225* 137* 157* 120*  BUN 39* 34* 19 44*  CREATININE 4.93* 4.41* 3.04* 5.61*  CALCIUM 8.0* 7.8* 8.2* 8.1*  PHOS 5.1* 4.1  --  4.5   Liver Function Tests: Recent Labs  Lab 05/10/20 1426 05/12/20 0920 05/16/20 0829  ALBUMIN 1.4* 1.4* 1.5*   CBC: Recent Labs  Lab 05/10/20 1426 05/12/20 0920 05/13/20 1009 05/14/20 0324 05/16/20 1007  WBC 7.1 7.3 6.7 8.0 7.8  NEUTROABS  --   --   --  4.8  --   HGB 8.2* 8.1* 7.7* 8.5* 8.2*  HCT 27.5* 26.6* 25.9* 26.7* 27.2*  MCV 86.8 86.6 87.8 85.0 86.9  PLT 183 194 175 186 189   Medications: . ceFEPime (MAXIPIME) IV 1 g (05/16/20 2047)  . [START ON 05/18/2020] ceFEPime (MAXIPIME) IV    . vancomycin     . (feeding supplement) PROSource Plus  30 mL Oral BID BM  . amLODipine  10 mg Oral Daily  . Chlorhexidine Gluconate Cloth  6 each Topical Q0600  . collagenase   Topical BID  . [START ON 05/20/2020] darbepoetin (ARANESP) injection - DIALYSIS  100 mcg Intravenous Q Thu-HD  . famotidine  20 mg Oral QHS  . feeding supplement  (NEPRO CARB STEADY)  237 mL Oral BID BM  . heparin injection (subcutaneous)  5,000 Units Subcutaneous Q8H  . insulin aspart  0-5 Units Subcutaneous QHS  . insulin aspart  0-9 Units Subcutaneous TID WC  . insulin glargine  10 Units Subcutaneous QHS  . metoCLOPramide  5 mg Oral TID AC  . metoprolol tartrate  50 mg Oral BID  . multivitamin  1 tablet Oral QHS  . senna  1 tablet Oral BID  . sodium chloride flush  3 mL Intravenous Q12H    Dialysis Orders: NWTTS 4h 400/800 143.5kg 3K/2.5Ca TDCHeparin3000units IV TIW -Mircera177mcgIVq2wks - last 12/8 -Venofer 100mg IVqHD x10 - completed 6/10 doses  Assessment/Plan: 1. Sacral decub/coccygealosteo: On Vanc/Cefepime x 6 weeks (thru 06/10/20) - ID following. Continues hydrotherapy.  2. Osteomyelitis L foot/ pressure injury bilat heels: Wound care + abx, to CIR. 3. COVID PNA- tested positive on 11/30, is off isolation now. 4. ESRD: Continue HD per TTS sched - next tomorrow. 5.  Hypertension/volume: No edema, BP stable. 6. Anemiaof CKD- Hgb8.2 - getting Aranesp 100 mcg IVq Thursday. 7. Secondary Hyperparathyroidism: CorrCa/Phos to goal.Not on VDRA or binders. 8. Nutrition: Alb very low, continue Nepro +  pro-source supplements. 9. DM T2 - per admit   Veneta Penton, PA-C 05/17/2020, 10:31 AM  [facility]

## 2020-05-17 NOTE — Progress Notes (Addendum)
Physical Therapy Treatment Patient Details Name: Fred [name] MRN: [id number] DOB: Sep 09, 1970 Today's Date: 05/17/2020    History of Present Illness Pt is a 49 y.o. male admitted from [facility] on 04/29/20 for evaluation of low oxygen saturations 82% on room air; concern for sepsis form Covid PNA, sacral wound and foot wounds. 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 Of note, pt recently admitted to [facility] due to gallbladder problems then d/c to [facility](14 days) for rehab then readmitted to [facility] then d/c to [facility] for rehab. PMH includes ESRD on HD (started 9/21), HTN, DM, C. diff.    PT Comments    Pt with improved orthostatic BP, see below, and was asymptomatic allowing pt to attempt standing x4 trials. Pt unable to achieve full upright standing and was to fatigued to attempted to laterally scoot to chair. Pt complete seated EOB exercises. Pt remains very deconditioned and weak but motivated to progress towards independence to return home with Dad. Continue to recommend CIR upon d/c for maximal functional recovery.  BP L sidelying (upon arrival)  122/79 Initial sitting BP: 112/84 BP s/p 5 min sitting: 136/95 BP s/p standingx3 and return to bed: 105/64    Follow Up Recommendations  CIR;Supervision for mobility/OOB     Equipment Recommendations  Wheelchair (measurements PT);Wheelchair cushion (measurements PT);Hospital bed;Other (comment)    Recommendations for Other Services       Precautions / Restrictions Precautions Precautions: Fall Precaution Comments: wounds on bilat feet and sacrum Required Braces or Orthoses: Other Brace Other Brace: B prevalon boots Restrictions Weight Bearing Restrictions: No    Mobility  Bed Mobility Overal bed mobility: Needs Assistance Bed Mobility: Sidelying to Sit;Sit to Sidelying   Sidelying to sit: Mod assist     Sit to sidelying: Min  assist General bed mobility comments: pt able to bring LEs on/off bed, modA for trunk elevation to achieve sitting EOB and to steady while pt getting hands in a supportive place. minA for trunk control coming down into sidleying, pt able to pull self up at head board  Transfers Overall transfer level: Needs assistance Equipment used: Ambulation equipment used   Sit to Stand: Max assist;+2 physical assistance;+2 safety/equipment;From elevated surface         General transfer comment: pt attempted sit to stand with RW and maxAx2 x2 trials and then with stedy x 2 trials, pt able to minimally clear bottocks from bed despite bed elevated and maxAx2. Pt very weak, will need maxi move for safe transfer OOB at this time  Ambulation/Gait             General Gait Details: unable   Stairs             Wheelchair Mobility    Modified Rankin (Stroke Patients Only)       Balance Overall balance assessment: Needs assistance Sitting-balance support: No upper extremity supported;Feet supported Sitting balance-Leahy Scale: Fair Sitting balance - Comments: pt able to maintain EOB balance with bilat UE support and feet supported on ground while EOB, pt with R lateral lean when attempting to do bilat LE ROM/ther ex Postural control: Right lateral lean Standing balance support: Bilateral upper extremity supported Standing balance-Leahy Scale: Zero Standing balance comment: unable to achieve full upright standing                            Cognition Arousal/Alertness: Awake/alert Behavior During Therapy: [facility]  for tasks assessed/performed Overall Cognitive Status: Within Functional Limits for tasks assessed                                 General Comments: pt wtih flat affect but motivated to work and followed commands well. pt with good problem solving when attempting to achieve full upright standing with RW and with stedy      Exercises General Exercises -  Lower Extremity Long Arc Quad: AROM;Strengthening;Both;Seated;10 reps    General Comments General comments (skin integrity, edema, etc.): pt wtih wounds on bilat feet and sacrum. pt with orthostatic BP however denies dizziness/lightheadedness.      Pertinent Vitals/Pain Pain Assessment: No/denies pain    Home Living                      Prior Function            PT Goals (current goals can now be found in the care plan section) Progress towards PT goals: Progressing toward goals    Frequency    Min 3X/week      PT Plan Current plan remains appropriate    Co-evaluation              AM-PAC PT "6 Clicks" Mobility   Outcome Measure  Help needed turning from your back to your side while in a flat bed without using bedrails?: A Little Help needed moving from lying on your back to sitting on the side of a flat bed without using bedrails?: A Lot Help needed moving to and from a bed to a chair (including a wheelchair)?: Total Help needed standing up from a chair using your arms (e.g., wheelchair or bedside chair)?: Total Help needed to walk in hospital room?: Total Help needed climbing 3-5 steps with a railing? : Total 6 Click Score: 9    End of Session Equipment Utilized During Treatment: Gait belt Activity Tolerance: Patient tolerated treatment well;Other (comment) Patient left: in bed;with call bell/phone within reach Nurse Communication: Mobility status;Need for lift equipment PT Visit Diagnosis: Muscle weakness (generalized) (M62.81);Difficulty in walking, not elsewhere classified (R26.2)     Time: 1040-1105 PT Time Calculation (min) (ACUTE ONLY): 25 min  Charges:  $Therapeutic Activity: 23-37 mins                     Kittie Plater, PT, DPT Acute Rehabilitation Services Pager #: [phone] Office #: [phone]    Berline Lopes 05/17/2020, 11:29 AM

## 2020-05-17 NOTE — Progress Notes (Addendum)
Physical Therapy Wound Treatment [name] MRN: [id number] Date of Birth: Jan 12, 1971  Today's Date: 05/17/2020 Time: 1200-1230 Time Calculation (min): 30 min  Subjective  Subjective: Agreeable to hydrotherapy. Patient and Family Stated Goals: I really want to get healed up. Date of Onset:  (prior to admission) Prior Treatments: Dressing changes  Pain Score:  Pt tolerated treatment well without premedication.   Wound Assessment  Pressure Injury 05/01/20 Buttocks Left Stage 4 - Full thickness tissue loss with exposed bone, tendon or muscle. packed with santyl gauze, dressed by WOCN, (Active)  Wound Image   Superior aspect of wound bed    Inferior/medial aspect of wound bed 05/17/20 1319  Dressing Type ABD;Barrier Film (skin prep);Gauze (Comment);Moist to dry 05/17/20 1319  Dressing Changed;Clean;Dry;Intact 05/17/20 1319  Dressing Change Frequency Daily 05/17/20 1319  State of Healing Early/partial granulation 05/17/20 1319  Site / Wound Assessment Pink;Yellow 05/17/20 1319  % Wound base Red or Granulating 70% 05/17/20 1319  % Wound base Yellow/Fibrinous Exudate 30% 05/17/20 1319  % Wound base Black/Eschar 0% 05/17/20 1319  % Wound base Other/Granulation Tissue (Comment) 0% 05/17/20 1319  Peri-wound Assessment Intact 05/17/20 1319  Wound Length (cm) 6 cm 05/17/20 1200  Wound Width (cm) 6 cm 05/17/20 1200  Wound Depth (cm) 3.5 cm 05/17/20 1200  Wound Surface Area (cm^2) 36 cm^2 05/17/20 1200  Wound Volume (cm^3) 126 cm^3 05/17/20 1200  Tunneling (cm) 0 05/11/20 1442  Undermining (cm) 5.0 at 9:00, 4.9 at 12:00, 2.8 at 5:00, 4.0 at 11:00 05/17/20 1200  Margins Unattached edges (unapproximated) 05/17/20 1319  Drainage Amount Minimal 05/17/20 1319  Drainage Description Serosanguineous 05/17/20 1319  Treatment Debridement (Selective);Hydrotherapy (Pulse lavage);Packing (Saline gauze) 05/17/20 1319   Santyl applied to wound bed prior to applying dressing.      Hydrotherapy Pulsed lavage therapy - wound location: L buttock wound Pulsed Lavage with Suction (psi): 12 psi Pulsed Lavage with Suction - Normal Saline Used: 1000 mL Pulsed Lavage Tip: Tip with splash shield Selective Debridement Selective Debridement - Location: buttock wound Selective Debridement - Tools Used: Forceps;Scissors Selective Debridement - Tissue Removed: yellow unviable tissue   Wound Assessment and Plan  Wound Therapy - Assess/Plan/Recommendations Wound Therapy - Clinical Statement: Wound bed continues to improve. Minimal slough debrided without bleeding noted today. The remaining yellow tissue is adherent. Pictures reflect the same wound but with different views of the wound bed. Hydrotherapy will continue to follow for selective removal of unviable tissue, to decrease bioburden and promote wound bed healing. Wound Therapy - Functional Problem List: decreased mobility Factors Delaying/Impairing Wound Healing: Diabetes Mellitus;Altered sensation;Immobility Hydrotherapy Plan: Debridement;Dressing change;Patient/family education;Pulsatile lavage with suction Wound Therapy - Frequency: 3X / week Wound Therapy - Current Recommendations: PT Wound Therapy - Follow Up Recommendations: Skilled nursing facility Wound Plan: see above  Wound Therapy Goals- Improve the function of patient's integumentary system by progressing the wound(s) through the phases of wound healing (inflammation - proliferation - remodeling) by: Decrease Necrotic Tissue to: 15% Decrease Necrotic Tissue - Progress: Progressing toward goal Increase Granulation Tissue to: 85% Increase Granulation Tissue - Progress: Progressing toward goal Improve Drainage Characteristics: Min;Serous Improve Drainage Characteristics - Progress: Progressing toward goal Goals/treatment plan/discharge plan were made with and agreed upon by patient/family: Yes Time For Goal Achievement: 7 days Wound Therapy - Potential for  Goals: Good  Goals will be updated until maximal potential achieved or discharge criteria met.  Discharge criteria: when goals achieved, discharge from hospital, MD decision/surgical intervention, no progress towards  goals, refusal/missing three consecutive treatments without notification or medical reason.  GP     [name] 05/17/2020, 1:40 PM   [name], PT, DPT Acute Rehabilitation Services Pager: [phone] Office: [phone]

## 2020-05-18 DIAGNOSIS — M8619 Other acute osteomyelitis, multiple sites: Secondary | ICD-10-CM

## 2020-05-18 DIAGNOSIS — I120 Hypertensive chronic kidney disease with stage 5 chronic kidney disease or end stage renal disease: Secondary | ICD-10-CM

## 2020-05-18 DIAGNOSIS — E1122 Type 2 diabetes mellitus with diabetic chronic kidney disease: Secondary | ICD-10-CM

## 2020-05-18 DIAGNOSIS — R5383 Other fatigue: Secondary | ICD-10-CM

## 2020-05-18 LAB — RENAL FUNCTION PANEL
Albumin: 1.6 g/dL — ABNORMAL LOW (ref 3.5–5.0)
Anion gap: 8 (ref 5–15)
BUN: 43 mg/dL — ABNORMAL HIGH (ref 6–20)
CO2: 23 mmol/L (ref 22–32)
Calcium: 8.4 mg/dL — ABNORMAL LOW (ref 8.9–10.3)
Chloride: 101 mmol/L (ref 98–111)
Creatinine, Ser: 5.45 mg/dL — ABNORMAL HIGH (ref 0.61–1.24)
GFR, Estimated: 12 mL/min — ABNORMAL LOW (ref >60)
Glucose, Bld: 166 mg/dL — ABNORMAL HIGH (ref 70–99)
Phosphorus: 4.9 mg/dL — ABNORMAL HIGH (ref 2.5–4.6)
Potassium: 4.7 mmol/L (ref 3.5–5.1)
Sodium: 132 mmol/L — ABNORMAL LOW (ref 135–145)

## 2020-05-18 LAB — CBC
HCT: 26.2 % — ABNORMAL LOW (ref 39.0–52.0)
Hemoglobin: 8.1 g/dL — ABNORMAL LOW (ref 13.0–17.0)
MCH: 26.8 pg (ref 26.0–34.0)
MCHC: 30.9 g/dL (ref 30.0–36.0)
MCV: 86.8 fL (ref 80.0–100.0)
Platelets: 223 10*3/uL (ref 150–400)
RBC: 3.02 MIL/uL — ABNORMAL LOW (ref 4.22–5.81)
RDW: 18.5 % — ABNORMAL HIGH (ref 11.5–15.5)
WBC: 7.6 10*3/uL (ref 4.0–10.5)
nRBC: 0 % (ref 0.0–0.2)

## 2020-05-18 LAB — GLUCOSE, CAPILLARY
Glucose-Capillary: 161 mg/dL — ABNORMAL HIGH (ref 70–99)
Glucose-Capillary: 137 mg/dL — ABNORMAL HIGH (ref 70–99)
Glucose-Capillary: 167 mg/dL — ABNORMAL HIGH (ref 70–99)

## 2020-05-18 MED ORDER — HEPARIN SODIUM (PORCINE) 1000 UNIT/ML DIALYSIS
20.0000 [IU]/kg | INTRAMUSCULAR | Status: DC | PRN
  Administered 2020-05-18 (×2): 3100 [IU] via INTRAVENOUS_CENTRAL

## 2020-05-18 MED ORDER — HEPARIN SODIUM (PORCINE) 1000 UNIT/ML IJ SOLN
INTRAMUSCULAR | Status: AC
  Filled 2020-05-18: qty 4

## 2020-05-18 MED ORDER — VANCOMYCIN HCL IN DEXTROSE 1-5 GM/200ML-% IV SOLN
INTRAVENOUS | Status: AC
  Filled 2020-05-18: qty 200

## 2020-05-18 NOTE — Progress Notes (Signed)
  [facility] Progress Note   Subjective:  Seen on HD - 3L UFG and tolerating so far. No CP or dyspnea. Chronic sacral pain, but tolerable.   Objective Vitals:   05/17/20 1945 05/18/20 0347 05/18/20 0500 05/18/20 0730  BP: 123/80 138/88  134/82  Pulse: 93 90  87  Resp: 15 15  17   Temp: 98.7 F (37.1 C) 98.2 F (36.8 C)  98.1 F (36.7 C)  TempSrc: Oral Oral  Oral  SpO2: 98% 99%  98%  Weight:   (!) 150 kg   Height:       Physical Exam General: Well appearing man, NAD.Room air. Heart: RRR; no murmur Lungs: CTAB Abdomen: soft Extremities: No LE edema; B heel wounds bandaged Dialysis Access: R [facility]  Additional Objective Labs: Basic Metabolic Panel: Recent Labs  Lab 05/12/20 0920 05/14/20 0324 05/16/20 0829  NA 133* 133* 132*  K 4.6 3.6 3.7  CL 98 97* 97*  CO2 27 28 26   GLUCOSE 137* 157* 120*  BUN 34* 19 44*  CREATININE 4.41* 3.04* 5.61*  CALCIUM 7.8* 8.2* 8.1*  PHOS 4.1  --  4.5   Liver Function Tests: Recent Labs  Lab 05/12/20 0920 05/16/20 0829  ALBUMIN 1.4* 1.5*   CBC: Recent Labs  Lab 05/12/20 0920 05/13/20 1009 05/14/20 0324 05/16/20 1007  WBC 7.3 6.7 8.0 7.8  NEUTROABS  --   --  4.8  --   HGB 8.1* 7.7* 8.5* 8.2*  HCT 26.6* 25.9* 26.7* 27.2*  MCV 86.6 87.8 85.0 86.9  PLT 194 175 186 189   Medications: . ceFEPime (MAXIPIME) IV    . vancomycin     . (feeding supplement) PROSource Plus  30 mL Oral BID BM  . amLODipine  10 mg Oral Daily  . Chlorhexidine Gluconate Cloth  6 each Topical Q0600  . collagenase   Topical BID  . [START ON 05/20/2020] darbepoetin (ARANESP) injection - DIALYSIS  100 mcg Intravenous Q Thu-HD  . famotidine  20 mg Oral QHS  . feeding supplement (NEPRO CARB STEADY)  237 mL Oral BID BM  . heparin injection (subcutaneous)  5,000 Units Subcutaneous Q8H  . insulin aspart  0-5 Units Subcutaneous QHS  . insulin aspart  0-9 Units Subcutaneous TID WC  . insulin glargine  10 Units Subcutaneous QHS  .  metoCLOPramide  5 mg Oral TID AC  . metoprolol tartrate  50 mg Oral BID  . multivitamin  1 tablet Oral QHS  . senna  1 tablet Oral BID  . sodium chloride flush  3 mL Intravenous Q12H    Dialysis Orders: NWTTS 4h 400/800 143.5kg 3K/2.5Ca TDCHeparin3000units IV TIW - Mircera171mcgIVq2wks - last 12/8 - Venofer 100mg IVqHD x10 - completed 6/10 doses  Assessment/Plan: 1. Sacral decub/coccygealosteo: On Vanc/Cefepime x 6 weeks (thru 06/10/20) - ID following. Continues hydrotherapy.  2. Osteomyelitis L foot/ pressure injury bilat heels: Wound care + abx, to CIR. 3. COVID PNA- tested positive on 11/30, is off isolation now. 4. ESRD: Continue HD per TTS sched - HD today, 3L UF goal. 5. Hypertension/volume: No edema, BP stable. 6. Anemiaof CKD- Hgb8.2 - getting Aranesp 100 mcg IVq Thursday. 7. Secondary Hyperparathyroidism: CorrCa/Phos to goal.Not on VDRA or binders. 8. Nutrition: Alb very low, continue Nepro + pro-source supplements. 9.  DM T2 - per admit   Veneta Penton, PA-C 05/18/2020, 9:08 AM  [employer]

## 2020-05-18 NOTE — Plan of Care (Signed)
  Problem: Education: Goal: Knowledge of risk factors and measures for prevention of condition will improve Outcome: Progressing   Problem: Respiratory: Goal: Will maintain a patent airway Outcome: Progressing   Problem: Clinical Measurements: Goal: Will remain free from infection Outcome: Progressing   Problem: Activity: Goal: Risk for activity intolerance will decrease Outcome: Progressing

## 2020-05-18 NOTE — Progress Notes (Signed)
° °  Subjective:   No acute overnight events.  Patient reports feeling good. No new updates today. Still awaiting CIR bed availability.  Objective:  Vital signs in last 24 hours: Vitals:   05/17/20 1338 05/17/20 1945 05/18/20 0347 05/18/20 0500  BP: 132/83 123/80 138/88   Pulse: 96 93 90   Resp:  15 15   Temp:  98.7 F (37.1 C) 98.2 F (36.8 C)   TempSrc:  Oral Oral   SpO2:  98% 99%   Weight:    (!) 150 kg  Height:       Physical Exam: General: obese male, sleeping in bed easily arousable, NAD. CV: normal rate and regular rhythm, no m/r/g Pulm: CTABL Abdomen: soft, nontender, nondistended, +BS Skin: warm and dry, coccygeal and bilateral feet wounds wrapped in dry gauze and c/d/i.   Assessment/Plan: Mr. Winfree is 49yo male with ESRD, hypertension, insulin-depending type II diabetes mellitus admitted 04/29/20 for sacral decubitus ulcer and COVID-19 pneumonia, continuing to progress well on IV antibiotics, awaiting CIR bed placement.   Principal Problem:   Sacral osteomyelitis ([place]) Active Problems:   Insulin dependent type 2 diabetes mellitus (HCC)   HTN (hypertension)   History of anemia due to chronic kidney disease   Pressure injury of both heels, unstageable ([place])   Pneumonia due to COVID-19 virus   ESRD (end stage renal disease) (HCC)   Avascular necrosis of first metatarsal, left foot (HCC)   Osteomyelitis of dorsal first metatarsal, left foot (HCC)   Decubitus ulcer of sacral region, unstageable ([place])   Morbid obesity (HCC)  #Unstageable sacral decubitus ulcer #Coccygeal osteomyelitis Continue with long-term antibiotics for osteomyelitis and wound care. Appears to be healing well. Currently awaiting bed placement per CIR. - continue vanc/cefepime (end date 06/10/20) - continue hydrotherapy, wound care per WOCN - pain management with tylenol q6h prn and percocet q4h prn - Air mattress, off-loading sacral wound - PT/OT - CIR once bed available  #Unstageable  pressure wounds (L lateral foot, lateral malleolus; R lateral foot, R heel) #Dry gangrene L 4th toe #Osteomyelitis L first metatarsal, R 5th metatarsal L lateral foot wound with fibrinous exudate. - C/w antibiotics - C/w dressings, Prevalon boots - wound care  #ESRD on HD Nephrology following, appreciate recs. Patient now on schedule for HD on TTS. - Plan for dialysis today  #Type II diabetes mellitus CBG improved to 160 this am. - C/w Lantus 10u, SSI - CBG monitoring TID, QHS  #Hypertension Controlled with current home regimen. SBPs 120s-130s. - C/w metoprolol tartrate 25mg  BID, amlodipine 10mg  daily  #Deconditioning #Generalized weakness Patient to continue working with PT/OT, waiting for bed placement with CIR. - continue feeding supplements for nutrition - PT/OT - Pending CIR placement  DIET: CM IVF: n/a DVT PPX: subQ heparin BOWEL: Senokot CODE: FULL FAM COM: n/a  Prior to Admission Living Arrangement: SNF Anticipated Discharge Location: CIR Barriers to Discharge: CIR bed availability Dispo: Anticipated discharge in approximately 1-2 day(s).   Iona Beard, MD 05/18/2020, 6:19 AM Pager: [phone] After 5pm on weekdays and 1pm on weekends: On Call pager [phone]

## 2020-05-19 LAB — GLUCOSE, CAPILLARY
Glucose-Capillary: 114 mg/dL — ABNORMAL HIGH (ref 70–99)
Glucose-Capillary: 194 mg/dL — ABNORMAL HIGH (ref 70–99)
Glucose-Capillary: 204 mg/dL — ABNORMAL HIGH (ref 70–99)
Glucose-Capillary: 229 mg/dL — ABNORMAL HIGH (ref 70–99)

## 2020-05-19 MED ORDER — DARBEPOETIN ALFA 200 MCG/0.4ML IJ SOSY
200.0000 ug | PREFILLED_SYRINGE | INTRAMUSCULAR | Status: DC
  Administered 2020-05-21: 200 ug via INTRAVENOUS
  Filled 2020-05-19 (×3): qty 0.4

## 2020-05-19 NOTE — Progress Notes (Signed)
Pharmacy Antibiotic Note- follow up   Fred [name] is a 49 y.o. male admitted on 04/29/2020 with coccygeal osteomyelitis.  Pharmacy has been consulted for vancomycin and cefepime dosing - ID planning to treat x 6 weeks through 06/10/20.  Last pre-HD vancomycin level was therapeutic at 18 mcg/mL (goal 15-25 mcg/mL) on 12/20.  Was on MWF HD schedule but back to TTS now that off isolation.  Next HD scheduled for 12/30.  Plan: Vancomycin 1g IV qHD on TTS Cefepime 2g IV qTTS at 1800 Monitor clinical progress, c/s, abx plan/LOT Pre-HD vancomycin level as indicated F/u HD schedule/tolerance inpatient   Height: 6\' 6"  (198.1 cm) Weight: (!) 149 kg (328 lb 7.8 oz) IBW/kg (Calculated) : 91.4  Temp (24hrs), Avg:98.4 F (36.9 C), Min:97.9 F (36.6 C), Max:98.6 F (37 C)  Recent Labs  Lab 05/12/20 0920 05/13/20 1009 05/14/20 0324 05/16/20 0829 05/16/20 1007 05/18/20 0859  WBC 7.3 6.7 8.0  --  7.8 7.6  CREATININE 4.41*  --  3.04* 5.61*  --  5.45*    Estimated Creatinine Clearance: 26.5 mL/min (A) (by C-G formula based on SCr of 5.45 mg/dL (H)).    Allergies  Allergen Reactions  . Atorvastatin Hives    NOT on MAR   Vanc 12/9>> (1/20) Cefepime 12/9>> (1/20)  Remdesivir 12/9>>12/14  12/20 VL = 18 mcg/mL on 1g qMWF >> no change  12/9 Bcx: negative  12/9: COVID Positive   Arturo Morton, PharmD, BCPS Please check AMION for all [facility] contact numbers Clinical Pharmacist 05/19/2020 8:32 AM

## 2020-05-19 NOTE — Consult Note (Signed)
WOC Nurse Consult Note: Patient receiving care in [facility] [phone]. Reason for Consult: care of sacral wound Wound type: stage 4 PI Pressure Injury POA: Yes Measurement: Please see PT hydrotherapy note from today Wound bed: clean, pink Drainage (amount, consistency, odor) no odor per discussion with PT L. Kirkman, drainage has decreased as well. Periwound: intact Dressing procedure/placement/frequency: Insert saline moistened gauze into the sacral wound.  Be sure to place into the undermined areas all around the edge of the wound. Cover with ABD pads, tape in place. I will place the patient on the [facility] team follow up list for a re-evaluation next week. Monitor the wound area(s) for worsening of condition such as: Signs/symptoms of infection,  Increase in size,  Development of or worsening of odor, Development of pain, or increased pain at the affected locations.  Notify the medical team if any of these develop. Val Riles, RN, MSN, CWOCN, CNS-BC, pager [phone]

## 2020-05-19 NOTE — Progress Notes (Signed)
Physical Therapy Wound Treatment [name] MRN: [id number] Date of Birth: [birth year]  Today's Date: 05/19/2020 Time: 1026-1050 Time Calculation (min): 24 min  Subjective  Subjective: Agreeable to hydrotherapy. Patient and Family Stated Goals: I really want to get healed up. Date of Onset:  (prior to admission) Prior Treatments: Dressing changes  Pain Score:  Pt did not complain of pain throughout session.  Wound Assessment  Pressure Injury 05/01/20 Buttocks Left Stage 4 - Full thickness tissue loss with exposed bone, tendon or muscle. packed with santyl gauze, dressed by WOCN, (Active)  Dressing Type ABD;Barrier Film (skin prep);Gauze (Comment);Moist to dry 05/19/20 1250  Dressing Changed;Clean;Dry;Intact 05/19/20 1250  Dressing Change Frequency Daily 05/19/20 1250  State of Healing Early/partial granulation 05/19/20 1250  Site / Wound Assessment Pink;Yellow 05/19/20 1250  % Wound base Red or Granulating 75% 05/19/20 1250  % Wound base Yellow/Fibrinous Exudate 10% 05/19/20 1250  % Wound base Black/Eschar 0% 05/19/20 1250  % Wound base Other/Granulation Tissue (Comment) 15% 05/19/20 1250  Peri-wound Assessment Intact;Pink 05/19/20 1250  Wound Length (cm) 6 cm 05/17/20 1200  Wound Width (cm) 6 cm 05/17/20 1200  Wound Depth (cm) 3.5 cm 05/17/20 1200  Wound Surface Area (cm^2) 36 cm^2 05/17/20 1200  Wound Volume (cm^3) 126 cm^3 05/17/20 1200  Tunneling (cm) 0 05/11/20 1442  Undermining (cm) 5.0 at 9:00, 4.9 at 12:00, 2.8 at 5:00, 4.0 at 11:00 05/17/20 1200  Margins Unattached edges (unapproximated) 05/19/20 1250  Drainage Amount Minimal 05/19/20 1250  Drainage Description Serosanguineous 05/19/20 1250  Treatment Debridement (Selective);Hydrotherapy (Pulse lavage);Packing (Saline gauze) 05/19/20 1250   Santyl applied to wound bed prior to applying dressing.     Hydrotherapy Pulsed lavage therapy - wound location: L buttock wound Pulsed Lavage with  Suction (psi): 12 psi Pulsed Lavage with Suction - Normal Saline Used: 1000 mL Pulsed Lavage Tip: Tip with splash shield Selective Debridement Selective Debridement - Location: buttock wound Selective Debridement - Tools Used: Forceps;Scissors Selective Debridement - Tissue Removed: yellow unviable tissue   Wound Assessment and Plan  Wound Therapy - Assess/Plan/Recommendations Wound Therapy - Clinical Statement: Wound continues to show improvement and is now with minimal unviable tissue. Some yellow/white tissue that remains appears healthy connective tissue. Hydrotherapy will sign off at this time as the wound is appropriate for dressing changes by nursing staff. Please refer to [facility] recommendations for wound care to the sacrum. Wound Therapy - Functional Problem List: decreased mobility Factors Delaying/Impairing Wound Healing: Diabetes Mellitus;Altered sensation;Immobility Hydrotherapy Plan: Debridement;Dressing change;Patient/family education;Pulsatile lavage with suction Wound Therapy - Frequency: 3X / week Wound Therapy - Current Recommendations: PT Wound Therapy - Follow Up Recommendations: Skilled nursing facility Wound Plan: see above  Wound Therapy Goals- Improve the function of patient's integumentary system by progressing the wound(s) through the phases of wound healing (inflammation - proliferation - remodeling) by: Decrease Necrotic Tissue to: 15% Decrease Necrotic Tissue - Progress: Met Increase Granulation Tissue to: 85% Increase Granulation Tissue - Progress: Met Improve Drainage Characteristics: Min;Serous Improve Drainage Characteristics - Progress: Progressing toward goal Goals/treatment plan/discharge plan were made with and agreed upon by patient/family: Yes Time For Goal Achievement: 7 days Wound Therapy - Potential for Goals: Good  Goals will be updated until maximal potential achieved or discharge criteria met.  Discharge criteria: when goals achieved,  discharge from hospital, MD decision/surgical intervention, no progress towards goals, refusal/missing three consecutive treatments without notification or medical reason.  GP     [name] 05/19/2020, 1:21 PM  [name], PT, DPT Acute Rehabilitation Services Pager: [phone] Office: [phone]

## 2020-05-19 NOTE — Progress Notes (Signed)
   Subjective:   No acute overnight events.  Patient evaluated at bedside, no new complaints today. Completes hydrotherapy. Still awaiting CIR bed availability.  Objective:  Vital signs in last 24 hours: Vitals:   05/18/20 1329 05/18/20 1952 05/19/20 0607 05/19/20 0743  BP: 136/87 125/79 (!) 142/73 139/76  Pulse:  92 85 84  Resp: 18   17  Temp: 98.5 F (36.9 C) 98.4 F (36.9 C) 98.6 F (37 C) 98.5 F (36.9 C)  TempSrc: Oral Oral Oral Oral  SpO2: 100% 99% 97% 98%  Weight:      Height:       Physical Exam: General: obese male, sleeping in bed easily arousable, NAD. CV: normal rate and regular rhythm, no m/r/g Pulm: CTABL Abdomen: soft, nontender, nondistended, +BS Skin: warm and dry, coccygeal and bilateral feet wounds wrapped in dry gauze and c/d/i.  Assessment/Plan: Mr. Ewen is 49yo male with ESRD, hypertension, insulin-depending type II diabetes mellitus admitted 04/29/20 for sacral decubitus ulcer and COVID-19 pneumonia, continuing to progress well on IV antibiotics, awaiting CIR bed placement.   Principal Problem:   Sacral osteomyelitis ([place]) Active Problems:   Insulin dependent type 2 diabetes mellitus (HCC)   HTN (hypertension)   History of anemia due to chronic kidney disease   Pressure injury of both heels, unstageable ([place])   Pneumonia due to COVID-19 virus   ESRD (end stage renal disease) (HCC)   Avascular necrosis of first metatarsal, left foot (HCC)   Osteomyelitis of dorsal first metatarsal, left foot (HCC)   Decubitus ulcer of sacral region, unstageable ([place])   Morbid obesity (HCC)  #Unstageable sacral decubitus ulcer #Coccygeal osteomyelitis Continue with long-term antibiotics for osteomyelitis and wound care. Appears to be healing well. Currently awaiting bed placement per CIR. - continue vanc/cefepime (end date 06/10/20) - Completed hydroptherpy, will have wound care RN see and provide reqs on wound care for nursing. - pain management with tylenol  q6h prn and percocet q4h prn - Air mattress, off-loading sacral wound - PT/OT - CIR once bed available  #Unstageable pressure wounds (L lateral foot, lateral malleolus; R lateral foot, R heel) #Dry gangrene L 4th toe #Osteomyelitis L first metatarsal, R 5th metatarsal L lateral foot wound with fibrinous exudate. Patient this morning with pressure dressings and no boots. Will need pressure pads and prevalon boot to offload heels and prevent worsening of pressure injuries. - C/w antibiotics - C/w dressings, Prevalon boots - wound care  #ESRD on HD Nephrology following, appreciate recs. Patient now on schedule for HD on TTS. Tolerated dialysis well yesterday  #Type II diabetes mellitus CBG continues to be well controlled - C/w Lantus 10u, SSI - CBG monitoring TID, QHS  #Hypertension Remains normotensive today. - C/w metoprolol tartrate 25mg  BID, amlodipine 10mg  daily  #Deconditioning #Generalized weakness Patient to continue working with PT/OT, waiting for bed placement with CIR. - continue feeding supplements for nutrition - PT/OT - Pending CIR placement  DIET: CM IVF: n/a DVT PPX: subQ heparin BOWEL: Senokot CODE: FULL FAM COM: n/a  Prior to Admission Living Arrangement: SNF Anticipated Discharge Location: CIR Barriers to Discharge: CIR bed availability Dispo: Anticipated discharge in approximately 1-2 day(s).   Iona Beard, MD 05/19/2020, 1:14 PM Pager: [phone] After 5pm on weekdays and 1pm on weekends: On Call pager [phone]

## 2020-05-19 NOTE — Plan of Care (Signed)
  Problem: Pain Managment: Goal: General experience of comfort will improve Outcome: Progressing   Problem: Safety: Goal: Ability to remain free from injury will improve Outcome: Progressing   Problem: Skin Integrity: Goal: Risk for impaired skin integrity will decrease Outcome: Progressing   

## 2020-05-19 NOTE — Progress Notes (Signed)
  [facility] Progress Note   Subjective:  Seen in room. No CP/dyspnea. No overnight issues.  Objective Vitals:   05/18/20 1329 05/18/20 1952 05/19/20 0607 05/19/20 0743  BP: 136/87 125/79 (!) 142/73 139/76  Pulse:  92 85 84  Resp: 18   17  Temp: 98.5 F (36.9 C) 98.4 F (36.9 C) 98.6 F (37 C) 98.5 F (36.9 C)  TempSrc: Oral Oral Oral Oral  SpO2: 100% 99% 97% 98%  Weight:      Height:       Physical Exam General:Well appearing man, NAD, lying on side. Room air. Heart:RRR; no murmur Lungs:CTAB Abdomen:soft Extremities:No LE edema; B heel wounds bandaged Dialysis Access:R [facility]  Additional Objective Labs: Basic Metabolic Panel: Recent Labs  Lab 05/14/20 0324 05/16/20 0829 05/18/20 0859  NA 133* 132* 132*  K 3.6 3.7 4.7  CL 97* 97* 101  CO2 28 26 23   GLUCOSE 157* 120* 166*  BUN 19 44* 43*  CREATININE 3.04* 5.61* 5.45*  CALCIUM 8.2* 8.1* 8.4*  PHOS  --  4.5 4.9*   Liver Function Tests: Recent Labs  Lab 05/16/20 0829 05/18/20 0859  ALBUMIN 1.5* 1.6*   CBC: Recent Labs  Lab 05/13/20 1009 05/14/20 0324 05/16/20 1007 05/18/20 0859  WBC 6.7 8.0 7.8 7.6  NEUTROABS  --  4.8  --   --   HGB 7.7* 8.5* 8.2* 8.1*  HCT 25.9* 26.7* 27.2* 26.2*  MCV 87.8 85.0 86.9 86.8  PLT 175 186 189 223   Medications: . ceFEPime (MAXIPIME) IV 2 g (05/18/20 1803)  . vancomycin 1,000 mg (05/18/20 1147)   . (feeding supplement) PROSource Plus  30 mL Oral BID BM  . amLODipine  10 mg Oral Daily  . Chlorhexidine Gluconate Cloth  6 each Topical Q0600  . collagenase   Topical BID  . [START ON 05/20/2020] darbepoetin (ARANESP) injection - DIALYSIS  100 mcg Intravenous Q Thu-HD  . famotidine  20 mg Oral QHS  . feeding supplement (NEPRO CARB STEADY)  237 mL Oral BID BM  . heparin injection (subcutaneous)  5,000 Units Subcutaneous Q8H  . insulin aspart  0-5 Units Subcutaneous QHS  . insulin aspart  0-9 Units Subcutaneous TID WC  . insulin glargine  10 Units  Subcutaneous QHS  . metoCLOPramide  5 mg Oral TID AC  . metoprolol tartrate  50 mg Oral BID  . multivitamin  1 tablet Oral QHS  . senna  1 tablet Oral BID  . sodium chloride flush  3 mL Intravenous Q12H    Dialysis Orders: NWTTS 4h 400/800 143.5kg 3K/2.5Ca TDCHeparin3000units IV TIW - Mircera128mcgIVq2wks - last 12/8 - Venofer 100mg IVqHD x10 - completed 6/10 doses  Assessment/Plan: 1. Sacral decub/coccygealosteo: On Vanc/Cefepime x 6 weeks (thru 06/10/20) - ID following.Continues hydrotherapy. CIR placement pending. 2. 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 3. COVID PNA- tested positive on 11/30, is off isolation now. 4. ESRD: Continue HD per TTS sched - HD tomorrow.  5. Hypertension/volume: No edema, BP stable. 6. Anemiaof CKD- Hgb8.1 -  getting Aranesp 100 mcg IVq Thursday, ^ dose. 7. Secondary Hyperparathyroidism: CorrCa/Phos to goal.Not on VDRA or binders. 8. Nutrition: Alb very low, continue Nepro + pro-source supplements. 9.  DM T2 - per admit   Fred Penton, PA-C 05/19/2020, 10:18 AM  [facility]

## 2020-05-19 NOTE — Progress Notes (Signed)
Inpatient Rehabilitation Admissions Coordinator  I met with patient at bedside with OT to assess his tolerance for increased intensity of rehab required of an inpt rehab admit. He states he has been up once to the chair since admission and is hopeful that he can get up soon. I have encouraged him to be able to sit up in recliner for an hour at a time to be able to tolerate more therapies. We will follow his progress.   , RN, MSN Rehab Admissions Coordinator [phone] 05/19/2020 1:26 PM  

## 2020-05-19 NOTE — Progress Notes (Signed)
Occupational Therapy Treatment Patient Details Name: Fred [name] MRN: [id number] DOB: 07-08-1970 Today's Date: 05/19/2020    History of present illness Pt is a 49 y.o. male admitted from [facility] on 04/29/20 for evaluation of low oxygen saturations 82% on room air; concern for sepsis form Covid PNA, sacral wound and foot wounds. 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 Of note, pt recently admitted to [facility] due to gallbladder problems then d/c to [facility](14 days) for rehab then readmitted to [facility] then d/c to [facility] for rehab. PMH includes ESRD on HD (started 9/21), HTN, DM, C. diff.   OT comments  Pt progressing gradually towards OT goals. Session focused on dynamic sitting balance and lateral WB through elbows to improve core strength and bed mobility. After exercises, pt able to demo bed mobility at min guard with improving technique. Discussed compensatory strategies for LB dressing to maximize safety with pt able to demo figure four position bed level. Fully assessing ADLs/mobility limited by sizewize bed malfunctions (pt to be switched to new bed) and inability to locate bariatric Stedy prior to session. Plan to progress standing tolerance in Stedy, sitting tolerance OOB (with consideration of skin integrity) and LB ADLs during next sessions.   Lying  141/91  Sitting 117/96     Follow Up Recommendations  CIR;Supervision/Assistance - 24 hour    Equipment Recommendations  3 in 1 bedside commode;Wheelchair (measurements OT);Wheelchair cushion (measurements OT);Hospital bed    Recommendations for Other Services Rehab consult    Precautions / Restrictions Precautions Precautions: Fall Precaution Comments: wounds on bilat feet and sacrum Required Braces or Orthoses: Other Brace Other Brace: B prevalon boots Restrictions Weight Bearing Restrictions: No       Mobility Bed Mobility Overal bed  mobility: Needs Assistance Bed Mobility: Rolling;Sidelying to Sit;Sit to Sidelying Rolling: Supervision Sidelying to sit: Mod assist     Sit to sidelying: Min assist General bed mobility comments: Pt initially Mod A for advancing trunk, after WB through B elbow exercises, pt able to demo sidelying to sit at min guard with use of bed rails. Pt initially Min A for negotiation of B LE back into bed and progressed to min guard after activities  Transfers                 General transfer comment: not attempted this session, unable to locate bari Stedy    Balance Overall balance assessment: Needs assistance Sitting-balance support: No upper extremity supported;Feet supported Sitting balance-Leahy Scale: Fair Sitting balance - Comments: initially reliant on UE support EOB but progressed to unsupported                                   ADL either performed or assessed with clinical judgement   ADL Overall ADL's : Needs assistance/impaired Eating/Feeding: Set up;Bed level Eating/Feeding Details (indicate cue type and reason): malfunction with bed settings, could not elevate bed for self feeding and unsafe to leave pt sitting EOB to eat. Pt propped up on elbow for lunch.                   Lower Body Dressing Details (indicate cue type and reason): Due to sitting balance deficits, currently unsafe to bend to feet for LB dressing. Pt able to demo figure four position in bed and reach feet. Educated on lateral leans for LB dressing while progressing strength into  standing for task. Without UE support EOB, pt may have difficulty using AE though progressing well towards this               General ADL Comments: Session focused on sitting balance and WB through B elbows to improve core strength/bed mobility. Pt return demo-ed well     Vision   Vision Assessment?: No apparent visual deficits   Perception     Praxis      Cognition Arousal/Alertness:  Awake/alert Behavior During Therapy: WFL for tasks assessed/performed Overall Cognitive Status: Impaired/Different from baseline Area of Impairment: Awareness;Safety/judgement                         Safety/Judgement: Decreased awareness of safety;Decreased awareness of deficits Awareness: Emergent   General Comments: Pt with flat affect but very pleasant and functional cognition during session. Pt with some decreased awareness of deficits/safety though not impulsive.        Exercises     Shoulder Instructions       General Comments Assessed BP in lying at 141/91, reported lightheadedness sitting EOB with BP at 117/94 (and remained the same stable numbers x 3 readings). Lightheadedness passes with increased time. Pt noted with bed malfunctions, unable to adjust bed, RN aware and new sizewise bed in hallway.    Pertinent Vitals/ Pain       Pain Assessment: Faces Faces Pain Scale: Hurts a little bit Pain Location: shoulder with weightbearing Pain Descriptors / Indicators: Sore Pain Intervention(s): Monitored during session  Home Living                                          Prior Functioning/Environment              Frequency  Min 3X/week        Progress Toward Goals  OT Goals(current goals can now be found in the care plan section)  Progress towards OT goals: Progressing toward goals  Acute Rehab OT Goals Patient Stated Goal: Heal wounds; be able to walk outside OT Goal Formulation: With patient Time For Goal Achievement: 05/18/20 Potential to Achieve Goals: Good ADL Goals Pt Will Perform Eating: with modified independence;sitting Pt Will Perform Grooming: with set-up;sitting Pt Will Perform Upper Body Bathing: with set-up;sitting Pt Will Perform Lower Body Bathing: with mod assist;bed level;sitting/lateral leans Pt Will Transfer to Toilet: squat pivot transfer;bedside commode;with mod assist Additional ADL Goal #1: Pt will  tolerate sitting up in recliner for 4 hours a day to improve ADL activity tolerance  Plan Discharge plan remains appropriate    Co-evaluation                 AM-PAC OT "6 Clicks" Daily Activity     Outcome Measure   Help from another person eating meals?: A Little Help from another person taking care of personal grooming?: A Little Help from another person toileting, which includes using toliet, bedpan, or urinal?: Total Help from another person bathing (including washing, rinsing, drying)?: A Lot Help from another person to put on and taking off regular upper body clothing?: A Little Help from another person to put on and taking off regular lower body clothing?: A Lot 6 Click Score: 14    End of Session    OT Visit Diagnosis: Other abnormalities of gait and mobility (R26.89);Muscle weakness (generalized) (M62.81);Other symptoms and signs  involving cognitive function;Dizziness and giddiness (R42)   Activity Tolerance Patient tolerated treatment well   Patient Left in bed;with call bell/phone within reach   Nurse Communication Mobility status;Other (comment);Need for lift equipment (bed difficulties)        Time: 4540-9811 OT Time Calculation (min): 42 min  Charges: OT General Charges $OT Visit: 1 Visit OT Treatments $Self Care/Home Management : 8-22 mins $Therapeutic Activity: 23-37 mins  [name], OTR/L   [name] 05/19/2020, 1:19 PM

## 2020-05-20 DIAGNOSIS — L89156 Pressure-induced deep tissue damage of sacral region: Secondary | ICD-10-CM

## 2020-05-20 LAB — VANCOMYCIN, RANDOM: Vancomycin Rm: 23

## 2020-05-20 LAB — GLUCOSE, CAPILLARY
Glucose-Capillary: 181 mg/dL — ABNORMAL HIGH (ref 70–99)
Glucose-Capillary: 238 mg/dL — ABNORMAL HIGH (ref 70–99)
Glucose-Capillary: 189 mg/dL — ABNORMAL HIGH (ref 70–99)

## 2020-05-20 NOTE — Progress Notes (Signed)
Physical Therapy Treatment Patient Details Name: Fred [name] MRN: [id number] DOB: 1970-12-10 Today's Date: 05/20/2020    History of Present Illness Pt is a 49 y.o. male admitted from [facility] on 04/29/20 for evaluation of low oxygen saturations 82% on room air; concern for sepsis form Covid PNA, sacral wound and foot wounds. 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 Of note, pt recently admitted to [facility] due to gallbladder problems then d/c to [facility](14 days) for rehab then readmitted to [facility] then d/c to [facility] for rehab. PMH includes ESRD on HD (started 9/21), HTN, DM, C. diff.    PT Comments    Pt supine on arrival, agreeable to therapy session, with good tolerance for mobility and good participation. Pt remains limited due to symptomatic hypotension with standing attempts, however BP remained stable when initially seated and for ~15 minutes seated EOB prior to standing attempts. Pt performed supine and seated BLE therapeutic exercises with good tolerance, of note pt with limited/weaker L hip flexion compared with RLE. Deferred OOB transfer to chair as pt reporting he is expecting to be taken to HD dept soon. Pt continues to benefit from PT services to progress toward functional mobility goals. D/C recs below remain appropriate. Orthostatic BPs  Supine 127/79 (90)  Sitting 120/84 (95)  Sitting after 6 min 123/93 (99)  Sitting after STS attempts 102/66 (78)  Supine (post activity) 108/74 (85)  SpO2 100% and HR 90's-105 bpm during activity    Follow Up Recommendations  CIR;Supervision for mobility/OOB     Equipment Recommendations  Wheelchair (measurements PT);Wheelchair cushion (measurements PT);Hospital bed;Other (comment)    Recommendations for Other Services       Precautions / Restrictions Precautions Precautions: Fall Precaution Comments: wounds on bilat feet and sacrum Required  Braces or Orthoses: Other Brace Other Brace: B prevalon boots Restrictions Weight Bearing Restrictions: No    Mobility  Bed Mobility Overal bed mobility: Needs Assistance Bed Mobility: Rolling;Sidelying to Sit;Sit to Sidelying Rolling: Supervision Sidelying to sit: Mod assist     Sit to sidelying: Min assist General bed mobility comments: cues for technique and heavy use of bed rail needed; to L EOB; able to perform posterior/sidelying supine scoot toward [facility] with supervision  Transfers Overall transfer level: Needs assistance Equipment used: Ambulation equipment used Transfers: Sit to/from Stand Sit to Stand: Total assist;From elevated surface         General transfer comment: attempted STS from elevated bed height to Stedy x3 trials but unable to reach full upright stance with +1totalA; deferred further trials as pt reporting increased dizziness  Ambulation/Gait                 Stairs             Wheelchair Mobility    Modified Rankin (Stroke Patients Only)       Balance Overall balance assessment: Needs assistance Sitting-balance support: No upper extremity supported;Feet supported Sitting balance-Leahy Scale: Fair Sitting balance - Comments: initially reliant on UE support EOB but progressed to unsupported       Standing balance comment: unable to achieve full upright standing                            Cognition Arousal/Alertness: Awake/alert Behavior During Therapy: WFL for tasks assessed/performed Overall Cognitive Status: Impaired/Different from baseline Area of Impairment: Awareness;Safety/judgement  Memory: Decreased short-term memory Following Commands: Follows one step commands consistently;Follows multi-step commands inconsistently Safety/Judgement: Decreased awareness of safety;Decreased awareness of deficits Awareness: Emergent Problem Solving: Slow processing;Requires verbal cues General  Comments: Pt with flat affect but very pleasant and functional cognition during session. Pt with some decreased awareness of deficits/safety though not impulsive.      Exercises General Exercises - Lower Extremity Ankle Circles/Pumps: AROM;Strengthening;Both;Seated;Supine;20 reps (x10 seated/x10 supine) Quad Sets: AROM;Strengthening;Both;5 reps;Supine Gluteal Sets: AROM;Strengthening;Both;5 reps;Supine Long Arc Quad: AROM;Strengthening;Both;Seated;10 reps Heel Slides: AAROM;Strengthening;Both;10 reps;Supine (AA to keep heels from dragging on bed) Hip ABduction/ADduction: AROM;Strengthening;Both;10 reps;Supine Hip Flexion/Marching: AROM;Strengthening;Both;Seated;10 reps    General Comments General comments (skin integrity, edema, etc.): see orthostatic BP assessment in comments      Pertinent Vitals/Pain Pain Assessment: Faces Pain Score: 0-No pain Faces Pain Scale: Hurts a little bit Pain Location: LBP Pain Descriptors / Indicators: Sore Pain Intervention(s): Monitored during session;Repositioned    Home Living                      Prior Function            PT Goals (current goals can now be found in the care plan section) Acute Rehab PT Goals Patient Stated Goal: Heal wounds; be able to walk outside PT Goal Formulation: With patient Time For Goal Achievement: 05/26/19 Potential to Achieve Goals: Good Progress towards PT goals: Progressing toward goals (slow progress)    Frequency    Min 3X/week      PT Plan Current plan remains appropriate    Co-evaluation              AM-PAC PT "6 Clicks" Mobility   Outcome Measure  Help needed turning from your back to your side while in a flat bed without using bedrails?: A Little Help needed moving from lying on your back to sitting on the side of a flat bed without using bedrails?: A Lot Help needed moving to and from a bed to a chair (including a wheelchair)?: Total Help needed standing up from a chair  using your arms (e.g., wheelchair or bedside chair)?: Total Help needed to walk in hospital room?: Total Help needed climbing 3-5 steps with a railing? : Total 6 Click Score: 9    End of Session Equipment Utilized During Treatment: Gait belt Activity Tolerance: Patient tolerated treatment well Patient left: in bed;with call bell/phone within reach Nurse Communication: Mobility status;Need for lift equipment PT Visit Diagnosis: Muscle weakness (generalized) (M62.81);Difficulty in walking, not elsewhere classified (R26.2)     Time: 8333-8329 PT Time Calculation (min) (ACUTE ONLY): 35 min  Charges:  $Therapeutic Exercise: 8-22 mins $Therapeutic Activity: 8-22 mins                     Josehua Hammar P., PTA Acute Rehabilitation Services Pager: [phone] Office: [place] 05/20/2020, 11:37 AM

## 2020-05-20 NOTE — Progress Notes (Signed)
Subjective:   No acute overnight events.  Patient evaluated at bedside, states he is doing well. Notes he has not been wearing boot to offload heels due to rubbing against his ankle. Encouraged him to continue wearing boots and to get up to chair during the day, which he is agreeable to.  Objective:  Vital signs in last 24 hours: Vitals:   05/19/20 0743 05/19/20 1442 05/19/20 1953 05/20/20 0328  BP: 139/76 134/72 (!) 146/93 (!) 147/78  Pulse: 84 80 92 78  Resp: 17 17 17 18   Temp: 98.5 F (36.9 C) 98.3 F (36.8 C) 98.3 F (36.8 C) 98.4 F (36.9 C)  TempSrc: Oral Oral Oral   SpO2: 98% 99% 99% 100%  Weight:      Height:       Physical Exam: General: obese male, alert, comfortable in bed NAD. Abdomen: soft, nontender, nondistended, +BS Skin: warm and dry, coccygeal and bilateral feet wounds wrapped in dry gauze. Abrasions to left lateral ankle no active bleeding.   Assessment/Plan: Fred [name] is 49yo male with ESRD, hypertension, insulin-depending type II diabetes mellitus admitted 04/29/20 for sacral decubitus ulcer and COVID-19 pneumonia, continuing to progress well on IV antibiotics, awaiting CIR bed placement.   Principal Problem:   Sacral osteomyelitis ([place]) Active Problems:   Insulin dependent type 2 diabetes mellitus (HCC)   HTN (hypertension)   History of anemia due to chronic kidney disease   Pressure injury of both heels, unstageable ([place])   Pneumonia due to COVID-19 virus   ESRD (end stage renal disease) (HCC)   Avascular necrosis of first metatarsal, left foot (HCC)   Osteomyelitis of dorsal first metatarsal, left foot (HCC)   Decubitus ulcer of sacral region, unstageable ([place])   Morbid obesity (HCC)  #Unstageable sacral decubitus ulcer #Coccygeal osteomyelitis Currently treating with long-term antibiotics for osteomyelitis and wound care. Encouraged patient to sit up in chair during day for at least an hour asday. Currently awaiting bed placement per  CIR. 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 Complaining of abrasion of the left ankle. Encourage patient to continue with prevalon boot to offload heels and prevent worsening of pressure injuries. May tolerate prevalon boot if padded at ankles to provide barrier against chafing  - C/w antibiotics - C/w dressings, Prevalon boots - wound care  #ESRD on HD Patient now on schedule for HD on TTS. Nephrology following. -Plan for dialysis today  #Type II diabetes mellitus CBG continues to be well controlled - C/w Lantus 10u, SSI - CBG monitoring TID, QHS  #Deconditioning #Generalized weakness Patient to continue working with PT/OT, waiting for bed placement with CIR. - continue feeding supplements for nutrition - encourage patient to up to chair twice daily - PT/OT - Pending CIR placement  DIET: CM IVF: n/a DVT PPX: subQ heparin BOWEL: Senokot CODE: FULL FAM COM: n/a  Prior to Admission Living Arrangement: SNF Anticipated Discharge Location: CIR Barriers to Discharge: CIR bed availability Dispo: Anticipated discharge in approximately 1-2 day(s).   Iona Beard, MD 05/20/2020, 6:16 AM Pager: [phone] After 5pm on weekdays and 1pm on weekends: On Call  pager [phone]

## 2020-05-20 NOTE — Plan of Care (Signed)
All dressing changes completed, sacrum and bilateral feet. Patient tolerated the dressing changes well. Will continue to monitor.   Problem: Education: Goal: Knowledge of General Education information will improve Description: Including pain rating scale, medication(s)/side effects and non-pharmacologic comfort measures Outcome: Progressing   Problem: Activity: Goal: Risk for activity intolerance will decrease Outcome: Progressing   Problem: Safety: Goal: Ability to remain free from injury will improve Outcome: Progressing   Problem: Skin Integrity: Goal: Risk for impaired skin integrity will decrease Outcome: Progressing   Problem: Education: Goal: Knowledge of General Education information will improve Description: Including pain rating scale, medication(s)/side effects and non-pharmacologic comfort measures Outcome: Progressing

## 2020-05-20 NOTE — Progress Notes (Addendum)
Pharmacy Antibiotic Note- follow up   Fred [name] is a 49 y.o. male admitted on 04/29/2020 with coccygeal osteomyelitis.  Pharmacy has been consulted for vancomycin and cefepime dosing - ID planning to treat x 6 weeks through 06/10/20. Patient was previously on MWF HD and now has been transitioned back to TTS.   Pre-HD vancomycin level obtained today and found to be therapeutic at 23 with goal 15-25 mcg/mL. HD planned for today.    Plan: Continue vancomycin 1g IV qHD on TTS Cefepime 2g IV qTTS at 1800 Monitor clinical progress, c/s, abx plan/LOT Pre-HD vancomycin level as indicated F/u HD schedule/tolerance inpatient   Height: 6\' 6"  (198.1 cm) Weight: (!) 149 kg (328 lb 7.8 oz) IBW/kg (Calculated) : 91.4  Temp (24hrs), Avg:98.4 F (36.9 C), Min:98.3 F (36.8 C), Max:98.4 F (36.9 C)  Recent Labs  Lab 05/14/20 0324 05/16/20 0829 05/16/20 1007 05/18/20 0859 05/20/20 1329  WBC 8.0  --  7.8 7.6  --   CREATININE 3.04* 5.61*  --  5.45*  --   VANCORANDOM  --   --   --   --  23    Estimated Creatinine Clearance: 26.5 mL/min (A) (by C-G formula based on SCr of 5.45 mg/dL (H)).    Allergies  Allergen Reactions  . Atorvastatin Hives    NOT on MAR   Vanc 12/9>> (1/20) Cefepime 12/9>> (1/20)  Remdesivir 12/9>>12/14  12/20 VL = 18 mcg/mL on 1g qMWF >> no change 12/30 VL = 23 mcg/mL on 1g   12/9 Bcx: negative  12/9: COVID Positive   Cristela Felt, PharmD Clinical Pharmacist  05/20/2020 2:29 PM

## 2020-05-20 NOTE — Plan of Care (Signed)
  Problem: Activity: Goal: Risk for activity intolerance will decrease Outcome: Progressing   Problem: Coping: Goal: Level of anxiety will decrease Outcome: Progressing   Problem: Safety: Goal: Ability to remain free from injury will improve Outcome: Progressing   Problem: Pain Managment: Goal: General experience of comfort will improve Outcome: Progressing   Problem: Skin Integrity: Goal: Risk for impaired skin integrity will decrease Outcome: Progressing

## 2020-05-20 NOTE — Progress Notes (Signed)
  [facility] Progress Note   Subjective: Seen in room - no overnight events noted. Denies CP or dyspnea. Tells me PT wants him to try to sit in chair regularly - discussed importance of this.    Objective Vitals:   05/19/20 1442 05/19/20 1953 05/20/20 0328 05/20/20 0808  BP: 134/72 (!) 146/93 (!) 147/78 (!) 140/94  Pulse: 80 92 78 92  Resp: 17 17 18 20   Temp: 98.3 F (36.8 C) 98.3 F (36.8 C) 98.4 F (36.9 C) 98.4 F (36.9 C)  TempSrc: Oral Oral  Oral  SpO2: 99% 99% 100% 99%  Weight:      Height:       Physical Exam General:Well appearing man, NAD, lying on side. Room air. Heart:RRR; no murmur Lungs:CTAB Abdomen:soft Extremities:No LE edema;B heelwounds bandaged Dialysis Access:[facility]  Additional Objective Labs: Basic Metabolic Panel: Recent Labs  Lab 05/14/20 0324 05/16/20 0829 05/18/20 0859  NA 133* 132* 132*  K 3.6 3.7 4.7  CL 97* 97* 101  CO2 28 26 23   GLUCOSE 157* 120* 166*  BUN 19 44* 43*  CREATININE 3.04* 5.61* 5.45*  CALCIUM 8.2* 8.1* 8.4*  PHOS  --  4.5 4.9*   Liver Function Tests: Recent Labs  Lab 05/16/20 0829 05/18/20 0859  ALBUMIN 1.5* 1.6*   CBC: Recent Labs  Lab 05/14/20 0324 05/16/20 1007 05/18/20 0859  WBC 8.0 7.8 7.6  NEUTROABS 4.8  --   --   HGB 8.5* 8.2* 8.1*  HCT 26.7* 27.2* 26.2*  MCV 85.0 86.9 86.8  PLT 186 189 223   Medications: . ceFEPime (MAXIPIME) IV 2 g (05/18/20 1803)  . vancomycin 1,000 mg (05/18/20 1147)   . (feeding supplement) PROSource Plus  30 mL Oral BID BM  . amLODipine  10 mg Oral Daily  . Chlorhexidine Gluconate Cloth  6 each Topical Q0600  . darbepoetin (ARANESP) injection - DIALYSIS  200 mcg Intravenous Q Thu-HD  . famotidine  20 mg Oral QHS  . feeding supplement (NEPRO CARB STEADY)  237 mL Oral BID BM  . heparin injection (subcutaneous)  5,000 Units Subcutaneous Q8H  . insulin aspart  0-5 Units Subcutaneous QHS  . insulin aspart  0-9 Units Subcutaneous TID WC  . insulin  glargine  10 Units Subcutaneous QHS  . metoCLOPramide  5 mg Oral TID AC  . metoprolol tartrate  50 mg Oral BID  . multivitamin  1 tablet Oral QHS  . senna  1 tablet Oral BID  . sodium chloride flush  3 mL Intravenous Q12H    Dialysis Orders: NWTTS 4h 400/800 143.5kg 3K/2.5Ca TDCHeparin3000units IV TIW - Mircera166mcgIVq2wks - last 12/8 - Venofer 100mg IVqHD x10 - completed 6/10 doses  Assessment/Plan: 1. Sacral decub/coccygealosteo: On Vanc/Cefepime x 6 weeks (thru 06/10/20) - ID following.Continues hydrotherapy. CIR placement pending. 2. 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 3. COVID PNA- tested positive on 11/30, is off isolation now. 4. ESRD: Continue HD per TTS sched -HD today. 5. Hypertension/volume: No edema, BP stable. 6. Anemiaof CKD- Hgb8.1 -  getting Aranesp q Thursday - dose increased to 280mcg. 7. Secondary Hyperparathyroidism: CorrCa/Phos to goal.Not on VDRA or binders. 8. Nutrition: Alb very low, continue Nepro + pro-source supplements. 9. DM T2 - per admit   Veneta Penton, PA-C 05/20/2020, 10:13 AM  [employer]

## 2020-05-21 LAB — RENAL FUNCTION PANEL
Albumin: 1.6 g/dL — ABNORMAL LOW (ref 3.5–5.0)
Anion gap: 10 (ref 5–15)
BUN: 48 mg/dL — ABNORMAL HIGH (ref 6–20)
CO2: 25 mmol/L (ref 22–32)
Calcium: 8.7 mg/dL — ABNORMAL LOW (ref 8.9–10.3)
Chloride: 100 mmol/L (ref 98–111)
Creatinine, Ser: 6.23 mg/dL — ABNORMAL HIGH (ref 0.61–1.24)
GFR, Estimated: 10 mL/min — ABNORMAL LOW (ref >60)
Glucose, Bld: 112 mg/dL — ABNORMAL HIGH (ref 70–99)
Phosphorus: 6 mg/dL — ABNORMAL HIGH (ref 2.5–4.6)
Potassium: 4.4 mmol/L (ref 3.5–5.1)
Sodium: 135 mmol/L (ref 135–145)

## 2020-05-21 LAB — CBC
HCT: 27.6 % — ABNORMAL LOW (ref 39.0–52.0)
Hemoglobin: 8.2 g/dL — ABNORMAL LOW (ref 13.0–17.0)
MCH: 26.2 pg (ref 26.0–34.0)
MCHC: 29.7 g/dL — ABNORMAL LOW (ref 30.0–36.0)
MCV: 88.2 fL (ref 80.0–100.0)
Platelets: 247 10*3/uL (ref 150–400)
RBC: 3.13 MIL/uL — ABNORMAL LOW (ref 4.22–5.81)
RDW: 18.1 % — ABNORMAL HIGH (ref 11.5–15.5)
WBC: 9.9 10*3/uL (ref 4.0–10.5)
nRBC: 0 % (ref 0.0–0.2)

## 2020-05-21 LAB — GLUCOSE, CAPILLARY
Glucose-Capillary: 114 mg/dL — ABNORMAL HIGH (ref 70–99)
Glucose-Capillary: 92 mg/dL (ref 70–99)
Glucose-Capillary: 151 mg/dL — ABNORMAL HIGH (ref 70–99)
Glucose-Capillary: 157 mg/dL — ABNORMAL HIGH (ref 70–99)

## 2020-05-21 MED ORDER — HEPARIN SODIUM (PORCINE) 1000 UNIT/ML DIALYSIS: 3000.0000 [IU] | INTRAMUSCULAR | Status: DC | PRN

## 2020-05-21 MED ORDER — SODIUM CHLORIDE 0.9 % IV SOLN: 100.0000 mL | INTRAVENOUS | Status: DC | PRN

## 2020-05-21 MED ORDER — HEPARIN SODIUM (PORCINE) 1000 UNIT/ML DIALYSIS: 20.0000 [IU]/kg | INTRAMUSCULAR | Status: DC | PRN

## 2020-05-21 MED ORDER — VANCOMYCIN HCL IN DEXTROSE 1-5 GM/200ML-% IV SOLN
INTRAVENOUS | Status: AC
  Filled 2020-05-21: qty 200

## 2020-05-21 MED ORDER — AMLODIPINE BESYLATE 10 MG PO TABS: 10.0000 mg | ORAL_TABLET | Freq: Every day | ORAL | Status: DC

## 2020-05-21 MED ORDER — DARBEPOETIN ALFA 200 MCG/0.4ML IJ SOSY
PREFILLED_SYRINGE | INTRAMUSCULAR | Status: AC
  Filled 2020-05-21: qty 0.4

## 2020-05-21 MED ORDER — HEPARIN SODIUM (PORCINE) 1000 UNIT/ML IJ SOLN
INTRAMUSCULAR | Status: AC
  Filled 2020-05-21: qty 4

## 2020-05-21 MED ORDER — CHLORHEXIDINE GLUCONATE CLOTH 2 % EX PADS
6.0000 | MEDICATED_PAD | Freq: Every day | CUTANEOUS | Status: DC
  Administered 2020-05-22 – 2020-06-02 (×8): 6 via TOPICAL

## 2020-05-21 MED ORDER — VANCOMYCIN HCL IN DEXTROSE 1-5 GM/200ML-% IV SOLN
1000.0000 mg | INTRAVENOUS | Status: AC
  Administered 2020-05-21: 1000 mg via INTRAVENOUS

## 2020-05-21 NOTE — Progress Notes (Signed)
Physical Therapy Treatment Patient Details Name: Fred [name] MRN: [id number] DOB: Feb 27, 1971 Today's Date: 05/21/2020    History of Present Illness Pt is a 49 y.o. male admitted from [facility] on 04/29/20 for evaluation of low oxygen saturations 82% on room air; concern for sepsis form Covid PNA, sacral wound and foot wounds. 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 Of note, pt recently admitted to [facility] due to gallbladder problems then d/c to [facility](14 days) for rehab then readmitted to [facility] then d/c to [facility] for rehab. PMH includes ESRD on HD (started 9/21), HTN, DM, C. diff.    PT Comments    Pt sidelying to L on arrival, agreeable to therapy session and with good participation and fair tolerance for mobility. Primary session focus on seated BLE therapeutic exercises, instruction on importance of seated tolerance and pressure offloading to maintain skin integrity. Pt performed seated BLE exercises as detailed below with good tolerance, needing verbal and tactile cues for proper technique. [name] did drop once in chair but stabilized after increased time seated, pt reported subjective lightheadedness but not severe. Pt did have episode of nausea/vomiting after attempting lateral lean for pressure offloading (after sitting ~40 mins), RN notified, but BP had stabilized by that time and pt defers anti-nausea meds.  Orthostatic BPs  Sitting immediately 106/70 (82)  Sitting after 5 min (legs reclined) 80/63 (71)  Sitting after 10 (legs reclined) 89/60 (71)  Sitting after 15 mins (reclined) 87/62 (71)  Sitting after 40 mins (after emesis, reclined) 129/89 (98)  Sitting after 50 mins (reclined) 110/75 (84)  Pt continues to benefit from PT services to progress toward functional mobility goals. D/C recs below remain appropriate.   Follow Up Recommendations  CIR;Supervision for mobility/OOB     Equipment  Recommendations  Wheelchair (measurements PT);Wheelchair cushion (measurements PT);Hospital bed;Other (comment) (anticipate need for hoyer lift and hospital bed, will continue to assess)    Recommendations for Other Services       Precautions / Restrictions Precautions Precautions: Fall Precaution Comments: wounds on bilat feet and L sacrum Required Braces or Orthoses: Other Brace Other Brace: B prevalon boots Restrictions Weight Bearing Restrictions: No    Mobility  Bed Mobility Overal bed mobility: Needs Assistance Bed Mobility: Rolling           General bed mobility comments: pt rolled each direction multiple reps for placement of lift sheet  Transfers Overall transfer level: Needs assistance               General transfer comment: totalA lift via maximove from bed>chair to assess seated tolerance for 1 hour to prepare for outpatient dialysis tx/seated tolerance for rehab  Ambulation/Gait                 Stairs             Wheelchair Mobility    Modified Rankin (Stroke Patients Only)       Balance Overall balance assessment: Needs assistance                                          Cognition Arousal/Alertness: Awake/alert Behavior During Therapy: WFL for tasks assessed/performed;Flat affect Overall Cognitive Status: Impaired/Different from baseline Area of Impairment: Awareness;Safety/judgement                     Memory:  Decreased short-term memory Following Commands: Follows one step commands consistently;Follows multi-step commands inconsistently Safety/Judgement: Decreased awareness of safety;Decreased awareness of deficits Awareness: Emergent Problem Solving: Slow processing;Requires verbal cues General Comments: Pt with flat affect but very pleasant and functional cognition during session. Pt with some decreased awareness of deficits/safety though not impulsive.      Exercises General Exercises -  Lower Extremity Ankle Circles/Pumps: AROM;Strengthening;Both;Seated;20 reps Quad Sets: AROM;Strengthening;Both;10 reps (reclined) Gluteal Sets: AROM;Strengthening;Both;5 reps;Supine Long Arc Quad: AROM;Strengthening;Both;Seated;10 reps Hip ABduction/ADduction: AROM;Strengthening;Both;10 reps;Seated (pillow squeezes) Hip Flexion/Marching: AROM;Strengthening;Both;Seated;10 reps    General Comments General comments (skin integrity, edema, etc.): pt with foam dressing over sacral pressure injury, some drainage noted but appears old; RN aware; pt instructed on sacral pressure offloading in chair every 10-15 mins (by lateral lean for at least 1-2 minutes to offload each side) could benefit from roho cushion in room for pressure offloading, [place] notified to order if possible      Pertinent Vitals/Pain Pain Assessment: Faces Pain Score: 0-No pain Pain Location: LBP Pain Descriptors / Indicators: Sore Pain Intervention(s): Monitored during session;Repositioned    Home Living                      Prior Function            PT Goals (current goals can now be found in the care plan section) Acute Rehab PT Goals Patient Stated Goal: Heal wounds; be able to walk outside PT Goal Formulation: With patient Time For Goal Achievement: 05/26/19 Potential to Achieve Goals: Fair Progress towards PT goals: Progressing toward goals    Frequency    Min 3X/week      PT Plan Current plan remains appropriate    Co-evaluation              AM-PAC PT "6 Clicks" Mobility   Outcome Measure  Help needed turning from your back to your side while in a flat bed without using bedrails?: A Little Help needed moving from lying on your back to sitting on the side of a flat bed without using bedrails?: A Lot Help needed moving to and from a bed to a chair (including a wheelchair)?: Total Help needed standing up from a chair using your arms (e.g., wheelchair or bedside chair)?: Total Help needed  to walk in hospital room?: Total Help needed climbing 3-5 steps with a railing? : Total 6 Click Score: 9    End of Session Equipment Utilized During Treatment: Gait belt Activity Tolerance: Patient tolerated treatment well Patient left: in chair;with call bell/phone within reach;with chair alarm set Nurse Communication: Mobility status;Need for lift equipment PT Visit Diagnosis: Muscle weakness (generalized) (M62.81);Difficulty in walking, not elsewhere classified (R26.2)     Time: 4481-8563 PT Time Calculation (min) (ACUTE ONLY): 30 min  Charges:  $Therapeutic Exercise: 8-22 mins $Therapeutic Activity: 8-22 mins                     Alaycia Eardley P., PTA Acute Rehabilitation Services Pager: [phone] Office: [place] 05/21/2020, 3:12 PM

## 2020-05-21 NOTE — Plan of Care (Signed)

## 2020-05-21 NOTE — Plan of Care (Signed)
  Problem: Safety: Goal: Ability to remain free from injury will improve Outcome: Progressing   Problem: Pain Managment: Goal: General experience of comfort will improve Outcome: Progressing   Problem: Skin Integrity: Goal: Risk for impaired skin integrity will decrease Outcome: Progressing   

## 2020-05-21 NOTE — Progress Notes (Signed)
[facility] Progress Note   Subjective:   Pt seen on HD (treatment rolled over from yesterday due to high census). Tolerating dialysis well. No complaints today, denies SOB, CP, dizziness, GI upset.   Objective Vitals:   05/20/20 0808 05/20/20 1524 05/20/20 2025 05/21/20 0430  BP: (!) 140/94 127/86 (!) 131/92 (!) 143/90  Pulse: 92 94 98 90  Resp: 20 18 17 18   Temp: 98.4 F (36.9 C) 98.2 F (36.8 C) 98.8 F (37.1 C) 98.6 F (37 C)  TempSrc: Oral Oral Oral Oral  SpO2: 99% 100% 100% 99%  Weight:      Height:       Physical Exam General: Well developed, alert male in NAD Heart: RRR, no murmurs, rubs or gallops Lungs: CTA bilaterally without wheezing, rhonchi or rales Abdomen: Soft, non-tender, non-distended, +BS Extremities: No edema b/l lower extremities  Dialysis Access: R IJ [facility]  Additional Objective Labs: Basic Metabolic Panel: Recent Labs  Lab 05/16/20 0829 05/18/20 0859  NA 132* 132*  K 3.7 4.7  CL 97* 101  CO2 26 23  GLUCOSE 120* 166*  BUN 44* 43*  CREATININE 5.61* 5.45*  CALCIUM 8.1* 8.4*  PHOS 4.5 4.9*   Liver Function Tests: Recent Labs  Lab 05/16/20 0829 05/18/20 0859  ALBUMIN 1.5* 1.6*   CBC: Recent Labs  Lab 05/16/20 1007 05/18/20 0859  WBC 7.8 7.6  HGB 8.2* 8.1*  HCT 27.2* 26.2*  MCV 86.9 86.8  PLT 189 223   Blood Culture    Component Value Date/Time   SDES BLOOD SITE NOT SPECIFIED 04/29/2020 1210   SPECREQUEST  04/29/2020 1210    BOTTLES DRAWN AEROBIC AND ANAEROBIC Blood Culture adequate volume   CULT  04/29/2020 1210    NO GROWTH 5 DAYS Performed at [facility], [place] [address] [place], [place] [address]    REPTSTATUS 05/04/2020 FINAL 04/29/2020 1210   CBG: Recent Labs  Lab 05/19/20 1952 05/20/20 1149 05/20/20 1720 05/20/20 2110 05/21/20 0710  GLUCAP 229* 181* 238* 189* 114*   Medications: . ceFEPime (MAXIPIME) IV 2 g (05/20/20 1711)  . vancomycin 1,000 mg (05/18/20 1147)  . vancomycin     .  (feeding supplement) PROSource Plus  30 mL Oral BID BM  . amLODipine  10 mg Oral Daily  . Chlorhexidine Gluconate Cloth  6 each Topical Q0600  . darbepoetin (ARANESP) injection - DIALYSIS  200 mcg Intravenous Q Thu-HD  . famotidine  20 mg Oral QHS  . feeding supplement (NEPRO CARB STEADY)  237 mL Oral BID BM  . heparin injection (subcutaneous)  5,000 Units Subcutaneous Q8H  . insulin aspart  0-5 Units Subcutaneous QHS  . insulin aspart  0-9 Units Subcutaneous TID WC  . insulin glargine  10 Units Subcutaneous QHS  . metoCLOPramide  5 mg Oral TID AC  . metoprolol tartrate  50 mg Oral BID  . multivitamin  1 tablet Oral QHS  . senna  1 tablet Oral BID  . sodium chloride flush  3 mL Intravenous Q12H    Dialysis Orders: NWTTS 4h 400/800 143.5kg 3K/2.5Ca TDCHeparin3000units IV TIW - Mircera144mcgIVq2wks - last 12/8 - Venofer 100mg IVqHD x10 - completed 6/10 doses  Assessment/Plan:  1. Sacral decub/coccygealosteo: On Vanc/Cefepime x 6 weeks (thru 06/10/20) - ID following.Continues hydrotherapy.CIR placement pending. 2. 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 3. COVID PNA- tested positive on 11/30, is off isolation now. 4. ESRD: Continue HD per TTS sched -HD today off schedule, resume  TTS schedule tomorrow.  5. Hypertension/volume: No edema, BP stable. 6. Anemiaof CKD- Hgb8.1 -getting Aranesp q Thursday - dose increased to 247mcg. 7. Secondary Hyperparathyroidism: CorrCa/Phos to goal.Not on VDRA or binders. 8. Nutrition: Alb very low, continue Nepro + pro-source supplements. 9. DM T2 - per admit  Anice Paganini, PA-C 05/21/2020, 8:24 AM  [facility] Pager: [phone]

## 2020-05-21 NOTE — Progress Notes (Signed)
Pharmacy Antibiotic Note- follow up   Fred [name] is a 49 y.o. male admitted on 04/29/2020 with coccygeal osteomyelitis.  Pharmacy has been consulted for vancomycin and cefepime dosing - ID planning to treat x 6 weeks through 06/10/20. Patient was previously on MWF HD and now has been transitioned back to TTS.   Patient did not receive dialysis yesterday on scheduled day. Patient in dialysis today for 3 hours. Vancomycin not given yesterday, but will schedule a dose for today's dialysis    Plan: Continue vancomycin 1g IV qHD on TTS Vancomycin 1gm IV x 1 with HD today Friday 12/31 Monitor clinical progress, c/s, abx plan/LOT Pre-HD vancomycin level as indicated F/u HD schedule/tolerance inpatient   Height: 6\' 6"  (198.1 cm) Weight: (!) 149 kg (328 lb 7.8 oz) IBW/kg (Calculated) : 91.4  Temp (24hrs), Avg:98.5 F (36.9 C), Min:98.2 F (36.8 C), Max:98.8 F (37.1 C)  Recent Labs  Lab 05/16/20 0829 05/16/20 1007 05/18/20 0859 05/20/20 1329  WBC  --  7.8 7.6  --   CREATININE 5.61*  --  5.45*  --   VANCORANDOM  --   --   --  23    Estimated Creatinine Clearance: 26.5 mL/min (A) (by C-G formula based on SCr of 5.45 mg/dL (H)).    Allergies  Allergen Reactions  . Atorvastatin Hives    NOT on MAR   Vanc 12/9>> (1/20) Cefepime 12/9>> (1/20)  Remdesivir 12/9>>12/14  12/20 VL = 18 mcg/mL on 1g qMWF >> no change 12/30 VL = 23 mcg/mL on 1g   12/9 Bcx: negative  12/9: COVID Positive   Fred [name] A. Levada Dy, PharmD, BCPS, FNKF Clinical Pharmacist [place] Please utilize Amion for appropriate phone number to reach the unit pharmacist ([place])   05/21/2020 8:21 AM

## 2020-05-21 NOTE — Progress Notes (Addendum)
Physical Therapy Treatment Patient Details Name: Fred [name] MRN: [id number] DOB: 15-Nov-1970 Today's Date: 05/21/2020    History of Present Illness Pt is a 49 y.o. male admitted from [facility] on 04/29/20 for evaluation of low oxygen saturations 82% on room air; concern for sepsis form Covid PNA, sacral wound and foot wounds. 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 Of note, pt recently admitted to [facility] due to gallbladder problems then d/c to [facility](14 days) for rehab then readmitted to [facility] then d/c to [facility] for rehab. PMH includes ESRD on HD (started 9/21), HTN, DM, C. diff.    PT Comments    Pt seated in chair upon PTA staff re-entry to room, second session focus on return transfer from chair>bed and supine exercises as well as supine pressure offloading techniques. Pt had been able to sit up in chair >60 minutes total. Pt cooperative, reporting mild continued nausea, however BP remains stable (BP 94/76 seated in chair on entry and BP 112/81 after return to supine taken in L arm). Pt performed supine BLE therapeutic exercises for strengthening and receptive to instruction on pressure offloading. Pt had been incontinent of bowels in chair and needed total A for peri-care/cleanup, will benefit from briefs next time he gets OOB. Pt totalA for maxi-move lift transfer from chair to bed, deferred Stedy attempt due to previous nausea and hx of orthostatic hypotension. Pt continues to benefit from PT services to progress toward functional mobility goals. D/C recs below remain appropriate.   Follow Up Recommendations  CIR;Supervision for mobility/OOB     Equipment Recommendations  Wheelchair (measurements PT);Wheelchair cushion (measurements PT);Hospital bed;Other (comment) (anticipate need for hoyer lift and hospital bed, will continue to assess)    Recommendations for Other Services       Precautions /  Restrictions Precautions Precautions: Fall Precaution Comments: wounds on bilat feet and L sacrum Required Braces or Orthoses: Other Brace Other Brace: B prevalon boots Restrictions Weight Bearing Restrictions: No    Mobility  Bed Mobility Overal bed mobility: Needs Assistance Bed Mobility: Rolling Rolling: Supervision         General bed mobility comments: pt rolled each direction multiple reps for removal of lift pad  Transfers Overall transfer level: Needs assistance               General transfer comment: totalA lift via maximove from bed>chair to assess seated tolerance for 1 hour to prepare for outpatient dialysis tx/seated tolerance for rehab  Ambulation/Gait                 Stairs             Wheelchair Mobility    Modified Rankin (Stroke Patients Only)       Balance Overall balance assessment: Needs assistance Sitting-balance support: Feet supported Sitting balance-Leahy Scale: Fair Sitting balance - Comments: able to sit up in chair with hands in lap for a few minutes priro to lift pad placement                                    Cognition Arousal/Alertness: Awake/alert Behavior During Therapy: WFL for tasks assessed/performed;Flat affect Overall Cognitive Status: Impaired/Different from baseline Area of Impairment: Awareness;Safety/judgement                     Memory: Decreased short-term memory Following Commands: Follows one step commands  consistently;Follows multi-step commands inconsistently Safety/Judgement: Decreased awareness of safety;Decreased awareness of deficits Awareness: Emergent Problem Solving: Slow processing;Requires verbal cues General Comments: Pt with flat affect but very pleasant and functional cognition during session. Pt with some decreased awareness of deficits/safety though not impulsive.      Exercises General Exercises - Lower Extremity Ankle Circles/Pumps:  AROM;Strengthening;Both;Seated;20 reps Quad Sets: AROM;Strengthening;Both;10 reps (reclined) Gluteal Sets: AROM;Strengthening;Both;5 reps;Supine Long Arc Quad: AROM;Strengthening;Both;Seated;10 reps Heel Slides: AROM;Strengthening;Both;10 reps;Supine Hip ABduction/ADduction: AROM;Strengthening;Both;10 reps;Supine (hip abduction) Hip Flexion/Marching: AROM;Strengthening;Both;Seated;10 reps    General Comments General comments (skin integrity, edema, etc.): pt reporting mild nausea t/o (previous episode n/v in chair) however defers anti-nausea meds      Pertinent Vitals/Pain Pain Assessment: 0-10 Pain Score: 2  Faces Pain Scale: Hurts a little bit Pain Location: LBP Pain Descriptors / Indicators: Sore Pain Intervention(s): Other (comment);Monitored during session;Repositioned (instruction on pressure offloading)    Home Living                      Prior Function            PT Goals (current goals can now be found in the care plan section) Acute Rehab PT Goals Patient Stated Goal: Heal wounds; be able to walk outside PT Goal Formulation: With patient Time For Goal Achievement: 05/26/19 Potential to Achieve Goals: Fair Progress towards PT goals: Progressing toward goals    Frequency    Min 3X/week      PT Plan Current plan remains appropriate    Co-evaluation              AM-PAC PT "6 Clicks" Mobility   Outcome Measure  Help needed turning from your back to your side while in a flat bed without using bedrails?: A Little Help needed moving from lying on your back to sitting on the side of a flat bed without using bedrails?: A Lot Help needed moving to and from a bed to a chair (including a wheelchair)?: Total Help needed standing up from a chair using your arms (e.g., wheelchair or bedside chair)?: Total Help needed to walk in hospital room?: Total Help needed climbing 3-5 steps with a railing? : Total 6 Click Score: 9    End of Session   Activity  Tolerance: Patient tolerated treatment well Patient left: with call bell/phone within reach;in bed (x3 rails up for safety in air bed, RN aware) Nurse Communication: Mobility status;Need for lift equipment PT Visit Diagnosis: Muscle weakness (generalized) (M62.81);Difficulty in walking, not elsewhere classified (R26.2)     Time: 3244-0102 PT Time Calculation (min) (ACUTE ONLY): 20 min  Charges:  $Therapeutic Exercise: 8-22 mins $Therapeutic Activity: 8-22 mins                     Elease Swarm P., PTA Acute Rehabilitation Services Pager: [phone] Office: [place] 05/21/2020, 4:13 PM

## 2020-05-21 NOTE — Progress Notes (Addendum)
   Subjective:   No acute overnight events.  Patient examined at bedside. Notes he did not get dialysis yesterday. Complains that boots limit his movement explained that they are there to help prevent further injury to his feet. Denies fever, chills, and abdominal pain.  Objective:  Vital signs in last 24 hours: Vitals:   05/20/20 0808 05/20/20 1524 05/20/20 2025 05/21/20 0430  BP: (!) 140/94 127/86 (!) 131/92 (!) 143/90  Pulse: 92 94 98 90  Resp: 20 18 17 18   Temp: 98.4 F (36.9 C) 98.2 F (36.8 C) 98.8 F (37.1 C) 98.6 F (37 C)  TempSrc: Oral Oral Oral Oral  SpO2: 99% 100% 100% 99%  Weight:      Height:       Physical Exam: General: obese male, sleeping in bed this morning comfortable in bed NAD. Abdomen: soft, nontender, nondistended Skin: warm and dry, coccygeal and bilateral feet wounds clean and dressed.   Assessment/Plan: Fred [name] is 49yo male with ESRD, hypertension, insulin-depending type II diabetes mellitus admitted 04/29/20 for sacral decubitus ulcer and COVID-19 pneumonia, continuing to progress well on IV antibiotics, awaiting CIR bed placement.   Principal Problem:   Sacral osteomyelitis ([place]) Active Problems:   Insulin dependent type 2 diabetes mellitus (HCC)   HTN (hypertension)   History of anemia due to chronic kidney disease   Pressure injury of both heels, unstageable ([place])   Pneumonia due to COVID-19 virus   ESRD (end stage renal disease) (HCC)   Avascular necrosis of first metatarsal, left foot (HCC)   Osteomyelitis of dorsal first metatarsal, left foot (HCC)   Decubitus ulcer of sacral region, unstageable ([place])   Morbid obesity (HCC)  #Unstageable sacral decubitus ulcer #Coccygeal osteomyelitis Remains on long term antibiotics. Currently awaiting bed placement per CIR. 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 Encouraged patient to continuee with boots to help prevent worsening or new injury. - C/w antibiotics - C/w wound care, Prevalon boots  #ESRD on HD Scheduled for HD on TTS, however did not get dialysis due to staffing will plan for dialysis today then resume normal schedule. Nephrology following.  #Hypertension Patient has been hypertensive with SBP in 140s will restart home amlodipine 10 mg daily   #Deconditioning #Generalized weakness Worked with PT yesterday did not get up in chair due to initiated transfer for dialysis. Unfortunately unable to get dialysis will try to get him up to chair after dialysis today.Still awaiting for bed placement with CIR. - continue feeding supplements for nutrition - encourage patient to up to chair twice daily - PT/OT - Pending CIR placement  DIET: CM DVT PPX: subQ heparin CODE: FULL  Prior to Admission Living Arrangement: SNF Anticipated Discharge Location: CIR Barriers to Discharge: CIR bed availability Dispo: Anticipated discharge in approximately 1-2 day(s).   Iona Beard, MD 05/21/2020, 6:08 AM Pager: [phone] After 5pm on weekdays and 1pm on weekends: On Call pager [phone]

## 2020-05-21 NOTE — Progress Notes (Signed)
OT Cancellation Note  Patient Details Name: Fred [name] MRN: [id number] DOB: 27-Mar-1971   Cancelled Treatment:    Reason Eval/Treat Not Completed: Patient at procedure or test/ unavailable. Pt at HD this AM. Will re-attempt OT session and OOB activities as time allows.   [name] 05/21/2020, 11:15 AM

## 2020-05-22 LAB — RENAL FUNCTION PANEL
Albumin: 1.7 g/dL — ABNORMAL LOW (ref 3.5–5.0)
Anion gap: 11 (ref 5–15)
BUN: 26 mg/dL — ABNORMAL HIGH (ref 6–20)
CO2: 26 mmol/L (ref 22–32)
Calcium: 8.8 mg/dL — ABNORMAL LOW (ref 8.9–10.3)
Chloride: 99 mmol/L (ref 98–111)
Creatinine, Ser: 4.57 mg/dL — ABNORMAL HIGH (ref 0.61–1.24)
GFR, Estimated: 15 mL/min — ABNORMAL LOW (ref >60)
Glucose, Bld: 99 mg/dL (ref 70–99)
Phosphorus: 4.6 mg/dL (ref 2.5–4.6)
Potassium: 4.4 mmol/L (ref 3.5–5.1)
Sodium: 136 mmol/L (ref 135–145)

## 2020-05-22 LAB — CBC
HCT: 30.7 % — ABNORMAL LOW (ref 39.0–52.0)
Hemoglobin: 9.2 g/dL — ABNORMAL LOW (ref 13.0–17.0)
MCH: 26.4 pg (ref 26.0–34.0)
MCHC: 30 g/dL (ref 30.0–36.0)
MCV: 88 fL (ref 80.0–100.0)
Platelets: 285 10*3/uL (ref 150–400)
RBC: 3.49 MIL/uL — ABNORMAL LOW (ref 4.22–5.81)
RDW: 18 % — ABNORMAL HIGH (ref 11.5–15.5)
WBC: 8.5 10*3/uL (ref 4.0–10.5)
nRBC: 0 % (ref 0.0–0.2)

## 2020-05-22 LAB — GLUCOSE, CAPILLARY
Glucose-Capillary: 101 mg/dL — ABNORMAL HIGH (ref 70–99)
Glucose-Capillary: 117 mg/dL — ABNORMAL HIGH (ref 70–99)
Glucose-Capillary: 237 mg/dL — ABNORMAL HIGH (ref 70–99)

## 2020-05-22 MED ORDER — CHLORHEXIDINE GLUCONATE CLOTH 2 % EX PADS
6.0000 | MEDICATED_PAD | Freq: Every day | CUTANEOUS | Status: DC
  Administered 2020-05-23 – 2020-06-02 (×10): 6 via TOPICAL

## 2020-05-22 MED ORDER — VANCOMYCIN HCL IN DEXTROSE 1-5 GM/200ML-% IV SOLN
1000.0000 mg | INTRAVENOUS | Status: DC
  Administered 2020-05-22 – 2020-06-01 (×3): 1000 mg via INTRAVENOUS
  Filled 2020-05-22 (×7): qty 200

## 2020-05-22 MED ORDER — HEPARIN SODIUM (PORCINE) 1000 UNIT/ML IJ SOLN
INTRAMUSCULAR | Status: AC
  Administered 2020-05-22: 1000 [IU] via INTRAVENOUS
  Filled 2020-05-22: qty 4

## 2020-05-22 MED ORDER — HEPARIN SODIUM (PORCINE) 1000 UNIT/ML DIALYSIS: 20.0000 [IU]/kg | INTRAMUSCULAR | Status: DC | PRN

## 2020-05-22 NOTE — Progress Notes (Signed)
[facility] Progress Note   Subjective:   Pt seen in room, tolerated HD yesterday with net UF 2.5L. Denies SOB, orthopnea, edema but weights trending up significantly (though may be some inaccuracy due to bed weights w/ air mattress).Pt thinks he is also eating more than usual.   Objective Vitals:   05/21/20 2009 05/22/20 0355 05/22/20 0500 05/22/20 0738  BP: (!) 119/93 135/83  125/77  Pulse: 91 78  84  Resp: 18 17  15   Temp: 98.4 F (36.9 C) 98.3 F (36.8 C)  98.4 F (36.9 C)  TempSrc: Oral Oral  Oral  SpO2: 100% 97%  97%  Weight:   (!) 164.2 kg   Height:       Physical Exam  General: Well developed, alert male in NAD Heart: RRR, no murmurs, rubs or gallops Lungs: CTA bilaterally without wheezing, rhonchi or rales Abdomen: Soft, non-tender, non-distended, +BS, trace edema lower abdomen Extremities: No edema b/l lower extremities  Dialysis Access: R [facility]   Additional Objective Labs: Basic Metabolic Panel: Recent Labs  Lab 05/16/20 0829 05/18/20 0859 05/21/20 0850  NA 132* 132* 135  K 3.7 4.7 4.4  CL 97* 101 100  CO2 26 23 25   GLUCOSE 120* 166* 112*  BUN 44* 43* 48*  CREATININE 5.61* 5.45* 6.23*  CALCIUM 8.1* 8.4* 8.7*  PHOS 4.5 4.9* 6.0*   Liver Function Tests: Recent Labs  Lab 05/16/20 0829 05/18/20 0859 05/21/20 0850  ALBUMIN 1.5* 1.6* 1.6*   CBC: Recent Labs  Lab 05/16/20 1007 05/18/20 0859 05/21/20 0845  WBC 7.8 7.6 9.9  HGB 8.2* 8.1* 8.2*  HCT 27.2* 26.2* 27.6*  MCV 86.9 86.8 88.2  PLT 189 223 247   Blood Culture    Component Value Date/Time   SDES BLOOD SITE NOT SPECIFIED 04/29/2020 1210   SPECREQUEST  04/29/2020 1210    BOTTLES DRAWN AEROBIC AND ANAEROBIC Blood Culture adequate volume   CULT  04/29/2020 1210    NO GROWTH 5 DAYS Performed at [facility] [address], [place], [place] [address]    REPTSTATUS 05/04/2020 FINAL 04/29/2020 1210   CBG: Recent Labs  Lab 05/21/20 0710 05/21/20 1250  05/21/20 1639 05/21/20 2011 05/22/20 0649  GLUCAP 114* 92 151* 157* 101*   Medications: . ceFEPime (MAXIPIME) IV 2 g (05/21/20 1704)  . vancomycin 1,000 mg (05/18/20 1147)   . (feeding supplement) PROSource Plus  30 mL Oral BID BM  . amLODipine  10 mg Oral Daily  . Chlorhexidine Gluconate Cloth  6 each Topical Q0600  . darbepoetin (ARANESP) injection - DIALYSIS  200 mcg Intravenous Q Thu-HD  . famotidine  20 mg Oral QHS  . feeding supplement (NEPRO CARB STEADY)  237 mL Oral BID BM  . heparin injection (subcutaneous)  5,000 Units Subcutaneous Q8H  . insulin aspart  0-5 Units Subcutaneous QHS  . insulin aspart  0-9 Units Subcutaneous TID WC  . insulin glargine  10 Units Subcutaneous QHS  . metoCLOPramide  5 mg Oral TID AC  . metoprolol tartrate  50 mg Oral BID  . multivitamin  1 tablet Oral QHS  . senna  1 tablet Oral BID  . sodium chloride flush  3 mL Intravenous Q12H    Dialysis Orders: NWTTS 4h 400/800 143.5kg 3K/2.5Ca TDCHeparin3000units IV TIW - Mircera144mcgIVq2wks - last 12/8 - Venofer 100mg IVqHD x10 - completed 6/10 doses   Assessment/Plan: 1. Sacral decub/coccygealosteo: On Vanc/Cefepime x 6 weeks (thru 06/10/20) - ID following.Continues hydrotherapy.CIR placement pending. 2.  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 3. COVID PNA- tested positive on 11/30, is off isolation now. 4. ESRD: Continue HD per TTS sched -HD today  5. Hypertension/volume: No edema, BP stable but weights up though variable, may be some inaccuracy with bed weights. Will attempt higher UF goals as tolerated 6. Anemiaof CKD- Hgb8.2-getting Aranespq Thursday - dose increased to 282mcg. 7. Secondary Hyperparathyroidism: CorrCa ok, phos slightly high.Not on VDRA or binders.If phos remains above goal will need to start a binder.  8. Nutrition: Alb very low, continue Nepro + pro-source supplements. 9. DM T2 - per admit  Anice Paganini,  PA-C 05/22/2020, 9:26 AM  [facility] Pager: [phone]

## 2020-05-22 NOTE — Progress Notes (Signed)
   Subjective:   Up to chair yesterday  States he was lifted to sit on the chair yesterday with lift and sat on the chair for 1 hr. He is a little sleepy but denies any pain. He reports soft but formed stools with some abdominal pain. E Plan for dialysis later today since he missed his sessions.   Objective:  Vital signs in last 24 hours: Vitals:   05/21/20 1244 05/21/20 2009 05/22/20 0355 05/22/20 0500  BP: (!) 139/92 (!) 119/93 135/83   Pulse: 90 91 78   Resp: 17 18 17    Temp: 98.2 F (36.8 C) 98.4 F (36.9 C) 98.3 F (36.8 C)   TempSrc: Oral Oral Oral   SpO2: 97% 100% 97%   Weight:    (!) 164.2 kg  Height:       Physical Exam: General: obese male, awake in bed this morning comfortable in bed NAD. Abdomen: soft, mild TTP of lower abdomen,  nondistended Skin: warm and dry, coccygeal and bilateral feet wounds clean and dressed.   Assessment/Plan: Mr. Meneely is 50yo male with ESRD, hypertension, insulin-depending type II diabetes mellitus admitted 04/29/20 for sacral decubitus ulcer and COVID-19 pneumonia, continuing to progress well on IV antibiotics, awaiting CIR bed placement.   Principal Problem:   Sacral osteomyelitis ([place]) Active Problems:   Insulin dependent type 2 diabetes mellitus (HCC)   HTN (hypertension)   History of anemia due to chronic kidney disease   Pressure injury of both heels, unstageable ([place])   Pneumonia due to COVID-19 virus   ESRD (end stage renal disease) (HCC)   Avascular necrosis of first metatarsal, left foot (HCC)   Osteomyelitis of dorsal first metatarsal, left foot (HCC)   Decubitus ulcer of sacral region, unstageable ([place])   Morbid obesity (HCC)  #Unstageable sacral decubitus ulcer #Coccygeal osteomyelitis Remains on long term antibiotics. Currently awaiting bed placement per CIR. 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  - C/w antibiotics - C/w wound care, Prevalon boots  #ESRD on HD Will resume TTS scheduling for HD. Nephrology following. -HD today  #Hypertension Normotensive today continue amlodipine 10 mg daily   #Deconditioning #Generalized weakness Up to chair yesterday. Still awaiting for bed placement with CIR likely more availability after the weekend.  - continue feeding supplements for nutrition - Continue to move patient to chair twice daily - PT/OT - Pending CIR placement  DIET: CM DVT PPX: subQ heparin CODE: FULL  Prior to Admission Living Arrangement: SNF Anticipated Discharge Location: CIR Barriers to Discharge: CIR bed availability Dispo: Anticipated discharge in approximately 1-2 day(s).   Iona Beard, MD 05/22/2020, 6:53 AM Pager: [phone] After 5pm on weekdays and 1pm on weekends: On Call pager [phone]

## 2020-05-22 NOTE — Plan of Care (Signed)
  Problem: Education: Goal: Knowledge of risk factors and measures for prevention of condition will improve Outcome: Progressing   Problem: Respiratory: Goal: Will maintain a patent airway Outcome: Progressing   Problem: Clinical Measurements: Goal: Ability to maintain clinical measurements within normal limits will improve Outcome: Progressing Goal: Will remain free from infection Outcome: Progressing

## 2020-05-23 LAB — RENAL FUNCTION PANEL
Albumin: 1.7 g/dL — ABNORMAL LOW (ref 3.5–5.0)
Anion gap: 11 (ref 5–15)
BUN: 20 mg/dL (ref 6–20)
CO2: 25 mmol/L (ref 22–32)
Calcium: 8.9 mg/dL (ref 8.9–10.3)
Chloride: 98 mmol/L (ref 98–111)
Creatinine, Ser: 3.28 mg/dL — ABNORMAL HIGH (ref 0.61–1.24)
GFR, Estimated: 22 mL/min — ABNORMAL LOW (ref >60)
Glucose, Bld: 207 mg/dL — ABNORMAL HIGH (ref 70–99)
Phosphorus: 4.6 mg/dL (ref 2.5–4.6)
Potassium: 4.1 mmol/L (ref 3.5–5.1)
Sodium: 134 mmol/L — ABNORMAL LOW (ref 135–145)

## 2020-05-23 LAB — GLUCOSE, CAPILLARY
Glucose-Capillary: 174 mg/dL — ABNORMAL HIGH (ref 70–99)
Glucose-Capillary: 192 mg/dL — ABNORMAL HIGH (ref 70–99)
Glucose-Capillary: 195 mg/dL — ABNORMAL HIGH (ref 70–99)
Glucose-Capillary: 192 mg/dL — ABNORMAL HIGH (ref 70–99)

## 2020-05-23 LAB — CBC
HCT: 30.7 % — ABNORMAL LOW (ref 39.0–52.0)
Hemoglobin: 9.1 g/dL — ABNORMAL LOW (ref 13.0–17.0)
MCH: 26.3 pg (ref 26.0–34.0)
MCHC: 29.6 g/dL — ABNORMAL LOW (ref 30.0–36.0)
MCV: 88.7 fL (ref 80.0–100.0)
Platelets: 238 10*3/uL (ref 150–400)
RBC: 3.46 MIL/uL — ABNORMAL LOW (ref 4.22–5.81)
RDW: 17.9 % — ABNORMAL HIGH (ref 11.5–15.5)
WBC: 8.5 10*3/uL (ref 4.0–10.5)
nRBC: 0 % (ref 0.0–0.2)

## 2020-05-23 NOTE — Progress Notes (Signed)
HD#24 Subjective:  Overnight Events: No Events ON   [name] was seen and evaluated at bedside this AM. He has no complaints today. He continues to endorse lower extremity weakness which he attributes to a prolonged stay during a previous hospitalization. We discussed the plan today and patient voices understanding. All questions and concerns were addressed.   Objective:  Vital signs in last 24 hours: Vitals:   05/23/20 0410 05/23/20 0500 05/23/20 0808 05/23/20 1422  BP: 106/63  115/73 (!) 130/96  Pulse: 87  86 89  Resp: 17  16 17   Temp: 98.6 F (37 C)  (!) 97.5 F (36.4 C) 98.8 F (37.1 C)  TempSrc:   Oral Oral  SpO2: 100%  96% 99%  Weight:  (!) 159.7 kg    Height:       Supplemental O2: Room Air SpO2: 99 % O2 Flow Rate (L/min): 1 L/min   Physical Exam:  Physical Exam Vitals and nursing note reviewed.  Constitutional:      General: He is not in acute distress.    Appearance: He is not ill-appearing, toxic-appearing or diaphoretic.  HENT:     Head: Normocephalic and atraumatic.  Cardiovascular:     Rate and Rhythm: Normal rate and regular rhythm.     Pulses: Normal pulses.     Heart sounds: Normal heart sounds. No murmur heard. No friction rub. No gallop.   Pulmonary:     Effort: Pulmonary effort is normal. No respiratory distress.     Breath sounds: No stridor. No wheezing, rhonchi or rales.  Abdominal:     General: Abdomen is flat. There is no distension.     Tenderness: There is no abdominal tenderness. There is no guarding or rebound.  Musculoskeletal:     Right lower leg: No edema.     Left lower leg: No edema.  Skin:    General: Skin is warm and dry.     Comments: Bilateral feet wrapped with bandages. Clean dry and intact  Neurological:     General: No focal deficit present.     Mental Status: He is alert and oriented to person, place, and time. Mental status is at baseline.  Psychiatric:        Mood and Affect: Mood normal.        Behavior:  Behavior normal.     Filed Weights   05/22/20 1023 05/22/20 1428 05/23/20 0500  Weight: (!) 160.5 kg (!) 156.8 kg (!) 159.7 kg     Intake/Output Summary (Last 24 hours) at 05/23/2020 1556 Last data filed at 05/23/2020 1300 Gross per 24 hour  Intake 1020 ml  Output --  Net 1020 ml   Net IO Since Admission: -20,263.45 mL [05/23/20 1556]  Pertinent Labs: CBC Latest Ref Rng & Units 05/23/2020 05/22/2020 05/21/2020  WBC 4.0 - 10.5 K/uL 8.5 8.5 9.9  Hemoglobin 13.0 - 17.0 g/dL 9.1(L) 9.2(L) 8.2(L)  Hematocrit 39.0 - 52.0 % 30.7(L) 30.7(L) 27.6(L)  Platelets 150 - 400 K/uL 238 285 247    CMP Latest Ref Rng & Units 05/23/2020 05/22/2020 05/21/2020  Glucose 70 - 99 mg/dL 207(H) 99 112(H)  BUN 6 - 20 mg/dL 20 26(H) 48(H)  Creatinine 0.61 - 1.24 mg/dL 3.28(H) 4.57(H) 6.23(H)  Sodium 135 - 145 mmol/L 134(L) 136 135  Potassium 3.5 - 5.1 mmol/L 4.1 4.4 4.4  Chloride 98 - 111 mmol/L 98 99 100  CO2 22 - 32 mmol/L 25 26 25   Calcium 8.9 - 10.3 mg/dL 8.9  8.8(L) 8.7(L)  Total Protein 6.5 - 8.1 g/dL - - -  Total Bilirubin 0.3 - 1.2 mg/dL - - -  Alkaline Phos 38 - 126 U/L - - -  AST 15 - 41 U/L - - -  ALT 0 - 44 U/L - - -    Imaging: No results found.  Assessment/Plan:   Principal Problem:   Sacral osteomyelitis (HCC) Active Problems:   Insulin dependent type 2 diabetes mellitus (HCC)   HTN (hypertension)   History of anemia due to chronic kidney disease   Pressure injury of both heels, unstageable ([place])   Pneumonia due to COVID-19 virus   ESRD (end stage renal disease) (HCC)   Avascular necrosis of first metatarsal, left foot (HCC)   Osteomyelitis of dorsal first metatarsal, left foot ([place])   Decubitus ulcer of sacral region, unstageable ([place])   Morbid obesity ([place])   Patient Summary: [name] is 50yo male with ESRD, hypertension, insulin-depending type II diabetes mellitus admitted 04/29/20 for sacral decubitus ulcer and COVID-19 pneumonia, continuing to progress well on IV  antibiotics, awaiting CIR bed placement.   #Unstageable sacral decubitus ulcer #Coccygeal osteomyelitis Remains on long term antibiotics. Currently awaiting bed placement per CIR. 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 Patient states when he wears the boots he feels as though it causes breakdown of the skin around his feet. - C/w antibiotics - C/w wound care, Prevalon boots  #ESRD on HD Will resume TTS scheduling for HD. Nephrology following. -Last HD session 05/22/20  #Hypertension Home 10 mg amlodipine ordered. Did not receive yesterday, pt hypotensive and had HD session. If continues to be hypotensive today, consider holding amlodipine   #Deconditioning #Generalized weakness Up to chair yesterday. Continuing to work with PT/OT to increase mobility. Due to patient's lack of exercise, uncertain if he will need SNF facility vs CIR.  - continue feeding supplements for nutrition - Continue to move patient to chair twice daily - PT/OT - Pending CIR placement  Diet: Carb-Modified IVF: None,None VTE: Heparin Code: Full PT/OT recs: CIR  Dispo: Anticipated discharge to Skilled nursing facility in 1-2 days pending CIR bed availability.  Sanjuana Letters DO Internal Medicine Resident PGY-1 Pager [phone] Please contact the on call pager after 5 pm and on weekends at [phone].

## 2020-05-23 NOTE — Plan of Care (Signed)
  Problem: Education: Goal: Knowledge of risk factors and measures for prevention of condition will improve Outcome: Progressing   Problem: Coping: Goal: Psychosocial and spiritual needs will be supported Outcome: Progressing   Problem: Respiratory: Goal: Will maintain a patent airway Outcome: Progressing Goal: Complications related to the disease process, condition or treatment will be avoided or minimized Outcome: Progressing   Problem: Education: Goal: Knowledge of General Education information will improve Description: Including pain rating scale, medication(s)/side effects and non-pharmacologic comfort measures Outcome: Progressing   Problem: Health Behavior/Discharge Planning: Goal: Ability to manage health-related needs will improve Outcome: Progressing   Problem: Clinical Measurements: Goal: Ability to maintain clinical measurements within normal limits will improve Outcome: Progressing Goal: Will remain free from infection Outcome: Progressing Goal: Diagnostic test results will improve Outcome: Progressing Goal: Respiratory complications will improve Outcome: Progressing Goal: Cardiovascular complication will be avoided Outcome: Progressing   Problem: Activity: Goal: Risk for activity intolerance will decrease Outcome: Progressing   Problem: Coping: Goal: Level of anxiety will decrease Outcome: Progressing   Problem: Pain Managment: Goal: General experience of comfort will improve Outcome: Progressing   Problem: Safety: Goal: Ability to remain free from injury will improve Outcome: Progressing   Problem: Skin Integrity: Goal: Risk for impaired skin integrity will decrease Outcome: Progressing   Problem: Education: Goal: Knowledge of General Education information will improve Description: Including pain rating scale, medication(s)/side effects and non-pharmacologic comfort measures Outcome: Progressing   Problem: Health Behavior/Discharge  Planning: Goal: Ability to manage health-related needs will improve Outcome: Progressing   Problem: Clinical Measurements: Goal: Ability to maintain clinical measurements within normal limits will improve Outcome: Progressing Goal: Will remain free from infection Outcome: Progressing Goal: Diagnostic test results will improve Outcome: Progressing Goal: Respiratory complications will improve Outcome: Progressing Goal: Cardiovascular complication will be avoided Outcome: Progressing   Problem: Activity: Goal: Risk for activity intolerance will decrease Outcome: Progressing   Problem: Nutrition: Goal: Adequate nutrition will be maintained Outcome: Progressing   Problem: Coping: Goal: Level of anxiety will decrease Outcome: Progressing   Problem: Elimination: Goal: Will not experience complications related to bowel motility Outcome: Progressing Goal: Will not experience complications related to urinary retention Outcome: Progressing   Problem: Pain Managment: Goal: General experience of comfort will improve Outcome: Progressing   Problem: Safety: Goal: Ability to remain free from injury will improve Outcome: Progressing   Problem: Skin Integrity: Goal: Risk for impaired skin integrity will decrease Outcome: Progressing

## 2020-05-23 NOTE — Progress Notes (Signed)
[facility] Progress Note   Subjective:   Pt seen in room. 3L UF with HD yesterday. Feeling well, no concerns. Weights still consistently above his EDW. He denies SOB, orthopnea, CP and edema. Reports he has been eating a lot of ice lately. Discussed fluid restrictions.   Objective Vitals:   05/22/20 1954 05/23/20 0410 05/23/20 0500 05/23/20 0808  BP: 115/70 106/63  115/73  Pulse: 94 87  86  Resp: 16 17  16   Temp: 97.7 F (36.5 C) 98.6 F (37 C)  (!) 97.5 F (36.4 C)  TempSrc: Oral   Oral  SpO2: 94% 100%  96%  Weight:   (!) 159.7 kg   Height:       Physical Exam  General:Well developed, alert male in NAD Heart:RRR, no murmurs, rubs or gallops Lungs:CTA bilaterally without wheezing, rhonchi or rales Abdomen:Soft, non-tender, non-distended, +BS Extremities:No edema b/l lower extremities Dialysis Access:R [facility]   Additional Objective Labs: Basic Metabolic Panel: Recent Labs  Lab 05/21/20 0850 05/22/20 1144 05/23/20 0743  NA 135 136 134*  K 4.4 4.4 4.1  CL 100 99 98  CO2 25 26 25   GLUCOSE 112* 99 207*  BUN 48* 26* 20  CREATININE 6.23* 4.57* 3.28*  CALCIUM 8.7* 8.8* 8.9  PHOS 6.0* 4.6 4.6   Liver Function Tests: Recent Labs  Lab 05/21/20 0850 05/22/20 1144 05/23/20 0743  ALBUMIN 1.6* 1.7* 1.7*   CBC: Recent Labs  Lab 05/18/20 0859 05/21/20 0845 05/22/20 1144 05/23/20 0743  WBC 7.6 9.9 8.5 8.5  HGB 8.1* 8.2* 9.2* 9.1*  HCT 26.2* 27.6* 30.7* 30.7*  MCV 86.8 88.2 88.0 88.7  PLT 223 247 285 238   Blood Culture    Component Value Date/Time   SDES BLOOD SITE NOT SPECIFIED 04/29/2020 1210   SPECREQUEST  04/29/2020 1210    BOTTLES DRAWN AEROBIC AND ANAEROBIC Blood Culture adequate volume   CULT  04/29/2020 1210    NO GROWTH 5 DAYS Performed at [facility] [address], [place], [place] [address]    REPTSTATUS 05/04/2020 FINAL 04/29/2020 1210    CBG: Recent Labs  Lab 05/22/20 0649 05/22/20 1620 05/22/20 2107  05/23/20 0812 05/23/20 1139  GLUCAP 101* 117* 237* 174* 192*   Medications: . ceFEPime (MAXIPIME) IV 2 g (05/22/20 1725)  . vancomycin Stopped (05/22/20 1650)   . (feeding supplement) PROSource Plus  30 mL Oral BID BM  . amLODipine  10 mg Oral Daily  . Chlorhexidine Gluconate Cloth  6 each Topical Q0600  . Chlorhexidine Gluconate Cloth  6 each Topical Q0600  . darbepoetin (ARANESP) injection - DIALYSIS  200 mcg Intravenous Q Thu-HD  . famotidine  20 mg Oral QHS  . feeding supplement (NEPRO CARB STEADY)  237 mL Oral BID BM  . heparin injection (subcutaneous)  5,000 Units Subcutaneous Q8H  . insulin aspart  0-5 Units Subcutaneous QHS  . insulin aspart  0-9 Units Subcutaneous TID WC  . insulin glargine  10 Units Subcutaneous QHS  . metoCLOPramide  5 mg Oral TID AC  . metoprolol tartrate  50 mg Oral BID  . multivitamin  1 tablet Oral QHS  . senna  1 tablet Oral BID  . sodium chloride flush  3 mL Intravenous Q12H    Dialysis Orders: NWTTS 4h 400/800 143.5kg 3K/2.5Ca TDCHeparin3000units IV TIW - Mircera136mcgIVq2wks - last 12/8 - Venofer 100mg IVqHD x10 - completed 6/10 doses  Assessment/Plan: 1. Sacral decub/coccygealosteo: On Vanc/Cefepime x 6 weeks (thru 06/10/20) - ID following.Continues  hydrotherapy.CIR placement pending. 2. 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 3. COVID PNA- tested positive on 11/30, is off isolation now. 4. ESRD: Continue HD per TTS schedule 5. Hypertension/volume: No edema or SOB, BP stable but weights up. Continue higher UF goal with HD. Discussed fluid/ice restrictions with patient. 6. Anemiaof CKD- Hgb8.2-getting Aranespq Thursday - dose increased to 268mcg. 7. Secondary Hyperparathyroidism: CorrCa high. phos at goal.Not on VDRA or binders.Using lowest available Ca bath with HD.  8. Nutrition: Alb very low, continue Nepro + pro-source supplements. 9. DM T2 - per admit   Anice Paganini, PA-C 05/23/2020, 12:07 PM  [facility] Pager: [phone]

## 2020-05-24 DIAGNOSIS — Z8616 Personal history of COVID-19: Secondary | ICD-10-CM

## 2020-05-24 DIAGNOSIS — K5900 Constipation, unspecified: Secondary | ICD-10-CM

## 2020-05-24 LAB — CBC
HCT: 30.7 % — ABNORMAL LOW (ref 39.0–52.0)
Hemoglobin: 9.3 g/dL — ABNORMAL LOW (ref 13.0–17.0)
MCH: 26.5 pg (ref 26.0–34.0)
MCHC: 30.3 g/dL (ref 30.0–36.0)
MCV: 87.5 fL (ref 80.0–100.0)
Platelets: 293 10*3/uL (ref 150–400)
RBC: 3.51 MIL/uL — ABNORMAL LOW (ref 4.22–5.81)
RDW: 18.2 % — ABNORMAL HIGH (ref 11.5–15.5)
WBC: 9 10*3/uL (ref 4.0–10.5)
nRBC: 0 % (ref 0.0–0.2)

## 2020-05-24 LAB — RENAL FUNCTION PANEL
Albumin: 1.7 g/dL — ABNORMAL LOW (ref 3.5–5.0)
Anion gap: 12 (ref 5–15)
BUN: 30 mg/dL — ABNORMAL HIGH (ref 6–20)
CO2: 25 mmol/L (ref 22–32)
Calcium: 9.3 mg/dL (ref 8.9–10.3)
Chloride: 97 mmol/L — ABNORMAL LOW (ref 98–111)
Creatinine, Ser: 4.61 mg/dL — ABNORMAL HIGH (ref 0.61–1.24)
GFR, Estimated: 15 mL/min — ABNORMAL LOW (ref >60)
Glucose, Bld: 186 mg/dL — ABNORMAL HIGH (ref 70–99)
Phosphorus: 6.5 mg/dL — ABNORMAL HIGH (ref 2.5–4.6)
Potassium: 3.9 mmol/L (ref 3.5–5.1)
Sodium: 134 mmol/L — ABNORMAL LOW (ref 135–145)

## 2020-05-24 LAB — GLUCOSE, CAPILLARY
Glucose-Capillary: 168 mg/dL — ABNORMAL HIGH (ref 70–99)
Glucose-Capillary: 205 mg/dL — ABNORMAL HIGH (ref 70–99)
Glucose-Capillary: 215 mg/dL — ABNORMAL HIGH (ref 70–99)
Glucose-Capillary: 303 mg/dL — ABNORMAL HIGH (ref 70–99)

## 2020-05-24 MED ORDER — PROSOURCE PLUS PO LIQD
30.0000 mL | Freq: Three times a day (TID) | ORAL | Status: DC
  Administered 2020-05-24 – 2020-06-06 (×16): 30 mL via ORAL
  Filled 2020-05-24 (×26): qty 30

## 2020-05-24 MED ORDER — SEVELAMER CARBONATE 800 MG PO TABS
800.0000 mg | ORAL_TABLET | Freq: Three times a day (TID) | ORAL | Status: DC
  Administered 2020-05-24 – 2020-06-03 (×26): 800 mg via ORAL
  Filled 2020-05-24 (×26): qty 1

## 2020-05-24 MED ORDER — POLYETHYLENE GLYCOL 3350 17 G PO PACK: 17.0000 g | PACK | Freq: Every day | ORAL | Status: DC

## 2020-05-24 MED ORDER — POLYETHYLENE GLYCOL 3350 17 G PO PACK
17.0000 g | PACK | Freq: Every day | ORAL | Status: DC
  Administered 2020-05-28: 17 g via ORAL
  Filled 2020-05-24 (×3): qty 1

## 2020-05-24 NOTE — Progress Notes (Signed)
Physical Therapy Treatment Patient Details Name: Fred [name] MRN: [id number] DOB: 01/08/1971 Today's Date: 05/24/2020    History of Present Illness Pt is a 50 y.o. male admitted from [facility] on 04/29/20 for evaluation of low oxygen saturations 82% on room air; concern for sepsis form Covid PNA, sacral wound and foot wounds. 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 Of note, pt recently admitted to [facility] due to gallbladder problems then d/c to [facility](14 days) for rehab then readmitted to [facility] then d/c to [facility] for rehab. PMH includes ESRD on HD (started 9/21), HTN, DM, C. diff.    PT Comments    Pt supine in bed on arrival.  He remains orthostatic with change in positions this session.  Able to achieve standing with good control of upper trunk until he became increasingly dizzy with pre-syncopal moment.  Pt continues to benefit from skilled placement in a post acute setting to maximize functional gains before returning home.    Orthostatic vitals: Lying:114/87 Sitting:107/71 Standing: 68/54   Follow Up Recommendations  CIR;Supervision for mobility/OOB     Equipment Recommendations  Wheelchair (measurements PT);Wheelchair cushion (measurements PT);Hospital bed;Other (comment) (hoyer lift)    Recommendations for Other Services       Precautions / Restrictions Precautions Precautions: Fall Precaution Comments: wounds on bilat feet and L sacrum Required Braces or Orthoses: Other Brace Other Brace: B prevalon boots Restrictions Weight Bearing Restrictions: No    Mobility  Bed Mobility Overal bed mobility: Needs Assistance Bed Mobility: Rolling Rolling: Supervision Sidelying to sit: Mod assist   Sit to supine: Min assist   General bed mobility comments: Mod assistance to elevate trunk into sitting, min assistance to return LEs back to bed.  Transfers Overall transfer level: Needs  assistance Equipment used: Ambulation equipment used (sara + sit to stand lift) Transfers: Sit to/from Stand Sit to Stand: Total assist;From elevated surface         General transfer comment: Total +1 with sara + sit to stand, patient able to manage his upper trunk in standing in frame but quickly became pre-syncopal with pressure dropping 68/54.  Ambulation/Gait Ambulation/Gait assistance:  (NT)               Stairs             Wheelchair Mobility    Modified Rankin (Stroke Patients Only)       Balance Overall balance assessment: Needs assistance Sitting-balance support: Feet supported Sitting balance-Leahy Scale: Fair       Standing balance-Leahy Scale: Zero                              Cognition Arousal/Alertness: Awake/alert Behavior During Therapy: WFL for tasks assessed/performed;Flat affect Overall Cognitive Status: Within Functional Limits for tasks assessed                                 General Comments: Pt pleasant and able to follow all commands during session this pm.      Exercises General Exercises - Lower Extremity Ankle Circles/Pumps: AROM;Both;15 reps;Supine Quad Sets: AROM;Both;10 reps;Supine Heel Slides: AROM;Strengthening;Both;10 reps;Supine Hip ABduction/ADduction: AROM;Strengthening;Both;10 reps;Supine    General Comments        Pertinent Vitals/Pain Pain Assessment: 0-10 Pain Score: 2  Pain Location: LBP Pain Descriptors / Indicators: Sore Pain Intervention(s): Monitored during session  Home Living                      Prior Function            PT Goals (current goals can now be found in the care plan section) Acute Rehab PT Goals Patient Stated Goal: Heal wounds; be able to walk outside Potential to Achieve Goals: Fair Progress towards PT goals: Progressing toward goals    Frequency    Min 3X/week      PT Plan Current plan remains appropriate    Co-evaluation               AM-PAC PT "6 Clicks" Mobility   Outcome Measure  Help needed turning from your back to your side while in a flat bed without using bedrails?: A Little Help needed moving from lying on your back to sitting on the side of a flat bed without using bedrails?: A Lot Help needed moving to and from a bed to a chair (including a wheelchair)?: Total Help needed standing up from a chair using your arms (e.g., wheelchair or bedside chair)?: Total Help needed to walk in hospital room?: Total Help needed climbing 3-5 steps with a railing? : Total 6 Click Score: 9    End of Session Equipment Utilized During Treatment: Gait belt Activity Tolerance: Patient tolerated treatment well   Nurse Communication: Mobility status;Need for lift equipment PT Visit Diagnosis: Muscle weakness (generalized) (M62.81);Difficulty in walking, not elsewhere classified (R26.2)     Time: 8648-4720 PT Time Calculation (min) (ACUTE ONLY): 34 min  Charges:  $Therapeutic Exercise: 8-22 mins $Therapeutic Activity: 8-22 mins                     Erasmo Leventhal , PTA Acute Rehabilitation Services Pager [phone] Office [phone]     Antonious Omahoney Eli Hose 05/24/2020, 3:45 PM

## 2020-05-24 NOTE — Progress Notes (Signed)
Pharmacy Antibiotic Note- follow up   Fred [name] is a 50 y.o. male admitted on 04/29/2020 with coccygeal osteomyelitis.  Pharmacy wa consulted for vancomycin and cefepime dosing - ID planning to treat x 6 weeks through 06/10/20. Patient was previously on MWF HD and  has been transitioned back to TTS.   Patient  received dialysis on 12/31 (Fri) for 3 hr, Vancomycin given 12/31 and had dialysis session for 4 hours on 1/1, Vancomycin 1g given 1/1 at end of HD.    Next  HD planned for tomorrow 1/4 Tues on schedule.    Plan: Continue vancomycin 1g IV qHD on TTS Vancomycin 1gm IV x 1 with HD today Friday 12/31 Monitor clinical progress, c/s, abx plan/LOT Pre-HD vancomycin level as indicated F/u HD schedule/tolerance inpatient   Height: 6\' 6"  (198.1 cm) Weight: (!) 159.7 kg (352 lb) (bed weight) IBW/kg (Calculated) : 91.4  Temp (24hrs), Avg:98.6 F (37 C), Min:98 F (36.7 C), Max:99 F (37.2 C)  Recent Labs  Lab 05/18/20 0859 05/20/20 1329 05/21/20 0845 05/21/20 0850 05/22/20 1144 05/23/20 0743 05/24/20 0339  WBC 7.6  --  9.9  --  8.5 8.5 9.0  CREATININE 5.45*  --   --  6.23* 4.57* 3.28* 4.61*  VANCORANDOM  --  23  --   --   --   --   --     Estimated Creatinine Clearance: 32.5 mL/min (A) (by C-G formula based on SCr of 4.61 mg/dL (H)).    Allergies  Allergen Reactions  . Atorvastatin Hives    NOT on MAR   Vanc 12/9>> (1/20) Cefepime 12/9>> (1/20)  Remdesivir 12/9>>12/14  12/20 VL = 18 mcg/mL on 1g qMWF >> no change 12/30 VL = 23 mcg/mL on 1g   12/9 Bcx: negative  12/9: COVID Positive   Nicole Cella, RPh Clinical Pharmacist [place] Please utilize Amion for appropriate phone number to reach the unit pharmacist ([place]) 05/24/2020 10:31 AM

## 2020-05-24 NOTE — Consult Note (Signed)
[place] Nurse wound follow up Patient receiving care in [facility] 5N8. Patient is on a low air loss mattress and able to turn and position self without assistance. The sacral stage 4 PI remains clean and pink, without odor or drainage. Continue the existing treatment of bid saline dressing to the area. Monitor the wound area(s) for worsening of condition such as: Signs/symptoms of infection,  Increase in size,  Development of or worsening of odor, Development of pain, or increased pain at the affected locations.  Notify the medical team if any of these develop.  Thank you for the consult. [place] nurse will not follow at this time.  Please re-consult the [place] team if needed.  Val Riles, RN, MSN, CWOCN, CNS-BC, pager [phone]

## 2020-05-24 NOTE — Plan of Care (Signed)
  Problem: Pain Managment: Goal: General experience of comfort will improve Outcome: Progressing   Problem: Safety: Goal: Ability to remain free from injury will improve Outcome: Progressing   Problem: Skin Integrity: Goal: Risk for impaired skin integrity will decrease Outcome: Progressing   

## 2020-05-24 NOTE — Plan of Care (Signed)
  Problem: Safety: Goal: Ability to remain free from injury will improve Outcome: Progressing   Problem: Skin Integrity: Goal: Risk for impaired skin integrity will decrease Outcome: Progressing   

## 2020-05-24 NOTE — Progress Notes (Signed)
Initial Nutrition Assessment  DOCUMENTATION CODES:   Morbid obesity  INTERVENTION:   -Renal MVI daily -30 ml Prosource Plus TID, each supplement provides 100 kcals and 15 grams protein -Continue Nepro Shake po BID, each supplement provides 425 kcal and 19 grams protein -Double protein portions with each meal  NUTRITION DIAGNOSIS:   Increased nutrient needs related to wound healing as evidenced by estimated needs.  GOAL:   Patient will meet greater than or equal to 90% of their needs  MONITOR:   PO intake,Supplement acceptance,Labs,Weight trends,Skin,I & O's  REASON FOR ASSESSMENT:   LOS    ASSESSMENT:   50 y/o male with  history of HTN, DM on insulin, ESRD, Anemia who presents from [facility] because the staff stated that he wasnt eating the way he usually does with recent diagnosis of COVID on 03/24/2020 admitted for COVID pneumonia.  Pt admitted with COVID pneumonia and stage IV pressure injury to sacrum.   12/11- hydrotherapy initiated 12/29- last hyrdotherapy treatment  Reviewed I/O's: +720 ml x 24 hours and -17.4 L since 05/10/20  Pt unavailable at time of attempted contact. Unable to obtain further nutriton-related history or complete nutrition-focused physical exam at this time.   Pt with good appetite. Noted meal completion 100%. Pt consuming Ensure Enlive and Prosource Plus supplements. Due to multiple wounds, pt with increased nutritional needs for wound healing and would greatly benefit from addition of oral nutrition supplements. Per MD notes, plan to re-consult orthopedics to re-evaluate wounds.   Reviewed wt hx; wt has been stable since admission. Per nephrology notes, EDW 143.5 kg.   Per chart review, plan to discharge to CIR once bed is available.   Medications reviewed and include aranesp, reglan, miralax, and senokot.   Lab Results  Component Value Date   HGBA1C 5.6 05/04/2020   PTA DM medications are 5-15 units insulin lispro 4 times daily.    Labs reviewed: Na: 134, Phos: 6.5, CBGS: 168-205 (inpatient orders for glycemic control are 0-5 units insulin aspart daily at bedtime, 0-9 units insulin aspart TID with meals, and 10 units insulin glargine daily at bedtime).   Diet Order:   Diet Order            Diet Carb Modified Fluid consistency: Thin; Room service appropriate? Yes; Fluid restriction: 1200 mL Fluid  Diet effective now                 EDUCATION NEEDS:   No education needs have been identified at this time  Skin:  Skin Assessment: Skin Integrity Issues: Skin Integrity Issues:: Stage IV,Unstageable Stage IV: buttocks Unstageable: lt lateral ankle, lt posterior foot, rt medial foot, rt heel  Last BM:  05/21/20  Height:   Ht Readings from Last 1 Encounters:  04/29/20 6\' 6"  (1.981 m)    Weight:   Wt Readings from Last 1 Encounters:  05/23/20 (!) 159.7 kg    Ideal Body Weight:  97.3 kg  BMI:  Body mass index is 40.68 kg/m.  Estimated Nutritional Needs:   Kcal:  3875-6433  Protein:  165-190 grams  Fluid:  1.2 L    Loistine Chance, RD, LDN, [place] Registered Dietitian II Certified Diabetes Care and Education Specialist Please refer to [facility] for RD and/or RD on-call/weekend/after hours pager

## 2020-05-24 NOTE — Progress Notes (Signed)
Inpatient Rehab Admissions Coordinator:   I have no beds available for this patient to admit to CIR today.  Will continue to follow for timing of potential admission pending bed availability.   Shann Medal, PT, DPT Admissions Coordinator [phone] 05/24/20  12:01 PM

## 2020-05-24 NOTE — Progress Notes (Signed)
HD#25 Subjective:  Overnight Events: No Events ON   Fred [name] was seen and evaluated at bedside this AM. Notes he is eating and drinking well. He endorses bowel movements every other day, normally he goes daily. He has no complaints today.   Objective:  Vital signs in last 24 hours: Vitals:   05/23/20 0808 05/23/20 1422 05/23/20 2015 05/24/20 0441  BP: 115/73 (!) 130/96 97/85 114/85  Pulse: 86 89 87 87  Resp: 16 17 18 16   Temp: (!) 97.5 F (36.4 C) 98.8 F (37.1 C) 99 F (37.2 C) 98.5 F (36.9 C)  TempSrc: Oral Oral Oral Oral  SpO2: 96% 99% 99% 98%  Weight:      Height:       Supplemental O2: Room Air SpO2: 98 % O2 Flow Rate (L/min): 1 L/min   Physical Exam:  Physical Exam Vitals and nursing note reviewed.  Constitutional:      General: He is not in acute distress.    Appearance: He is not ill-appearing, toxic-appearing or diaphoretic.  HENT:     Head: Normocephalic and atraumatic.  Cardiovascular:     Rate and Rhythm: Normal rate and regular rhythm.     Pulses: Normal pulses.     Heart sounds: Normal heart sounds. No murmur heard. No friction rub. No gallop.   Pulmonary:     Effort: Pulmonary effort is normal. No respiratory distress.     Breath sounds: No stridor. No wheezing, rhonchi or rales.  Abdominal:     General: Abdomen is flat. There is no distension.     Tenderness: There is no abdominal tenderness. There is no guarding or rebound.  Musculoskeletal:     Right lower leg: No edema.     Left lower leg: No edema.     Comments: Patient's left fourth toe nectrotic (see images attached) pressure sore to left lateral malleolus. Pressure sore of lateral plantar aspect of left foot  Skin:    General: Skin is warm and dry.     Comments: Bilateral feet wrapped with bandages. Clean dry and intact  Neurological:     General: No focal deficit present.     Mental Status: He is alert and oriented to person, place, and time. Mental status is at baseline.   Psychiatric:        Mood and Affect: Mood normal.        Behavior: Behavior normal.            Filed Weights   05/22/20 1023 05/22/20 1428 05/23/20 0500  Weight: (!) 160.5 kg (!) 156.8 kg (!) 159.7 kg     Intake/Output Summary (Last 24 hours) at 05/24/2020 0738 Last data filed at 05/23/2020 1700 Gross per 24 hour  Intake 720 ml  Output --  Net 720 ml   Net IO Since Admission: -20,023.45 mL [05/24/20 0738]  Pertinent Labs: CBC Latest Ref Rng & Units 05/24/2020 05/23/2020 05/22/2020  WBC 4.0 - 10.5 K/uL 9.0 8.5 8.5  Hemoglobin 13.0 - 17.0 g/dL 9.3(L) 9.1(L) 9.2(L)  Hematocrit 39.0 - 52.0 % 30.7(L) 30.7(L) 30.7(L)  Platelets 150 - 400 K/uL 293 238 285    CMP Latest Ref Rng & Units 05/24/2020 05/23/2020 05/22/2020  Glucose 70 - 99 mg/dL 186(H) 207(H) 99  BUN 6 - 20 mg/dL 30(H) 20 26(H)  Creatinine 0.61 - 1.24 mg/dL 4.61(H) 3.28(H) 4.57(H)  Sodium 135 - 145 mmol/L 134(L) 134(L) 136  Potassium 3.5 - 5.1 mmol/L 3.9 4.1 4.4  Chloride 98 -  111 mmol/L 97(L) 98 99  CO2 22 - 32 mmol/L 25 25 26   Calcium 8.9 - 10.3 mg/dL 9.3 8.9 8.8(L)  Total Protein 6.5 - 8.1 g/dL - - -  Total Bilirubin 0.3 - 1.2 mg/dL - - -  Alkaline Phos 38 - 126 U/L - - -  AST 15 - 41 U/L - - -  ALT 0 - 44 U/L - - -    Imaging: No results found.  Assessment/Plan:   Principal Problem:   Sacral osteomyelitis (HCC) Active Problems:   Insulin dependent type 2 diabetes mellitus (HCC)   HTN (hypertension)   History of anemia due to chronic kidney disease   Pressure injury of both heels, unstageable ([place])   Pneumonia due to COVID-19 virus   ESRD (end stage renal disease) (HCC)   Avascular necrosis of first metatarsal, left foot (HCC)   Osteomyelitis of dorsal first metatarsal, left foot ([place])   Decubitus ulcer of sacral region, unstageable ([place])   Morbid obesity ([place])   Patient Summary: Fred [name] is 50yo male with ESRD, hypertension, insulin-depending type II diabetes mellitus admitted 04/29/20 for sacral  decubitus ulcer and COVID-19 pneumonia, continuing to progress well on IV antibiotics, awaiting CIR bed placement.   #Unstageable sacral decubitus ulcer #Coccygeal osteomyelitis Remains on long term antibiotics. Currently awaiting bed placement per CIR, reached out to social work as well for an update. 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 Patient was evaluated by Dr. Sharol Given earlier on in patient's hospital admission and recommended he follow up in the office. However, due to the worsening condition, will consult orthopedics again -ortho consult placed - C/w antibiotics - C/w wound care, Prevalon boots  #ESRD on HD Will resume TTS scheduling for HD. Nephrology following. -Last HD session 05/22/20, next session tomorrow  #Hypertension contine home amlodipine 10 mg  #Deconditioning #Generalized weakness Up to chair yesterday. Continuing to work with PT/OT to increase mobility. Due to patient's lack of exercise, uncertain if he will need SNF facility vs CIR.  - continue feeding supplements for nutrition - Continue to move patient to chair twice daily - PT/OT - Pending CIR placement  #Constipation Patient states he is not having daily regular bowel movements.  - will give one dose miralax today and reassess tomorrow  Diet: Carb-Modified IVF: None,None VTE: Heparin Code: Full PT/OT recs: CIR  Dispo: Anticipated discharge to Skilled nursing facility in 1-2 days pending CIR bed availability.  Sanjuana Letters DO Internal Medicine  Resident PGY-1 Pager [phone] Please contact the on call pager after 5 pm and on weekends at [phone].

## 2020-05-24 NOTE — Progress Notes (Signed)
  [facility] Progress Note   Subjective:  Seen in room - no overnight issues. No CP or dyspnea.  Objective Vitals:   05/23/20 1422 05/23/20 2015 05/24/20 0441 05/24/20 0751  BP: (!) 130/96 97/85 114/85 131/89  Pulse: 89 87 87 86  Resp: 17 18 16 18   Temp: 98.8 F (37.1 C) 99 F (37.2 C) 98.5 F (36.9 C) 98 F (36.7 C)  TempSrc: Oral Oral Oral Oral  SpO2: 99% 99% 98% 100%  Weight:      Height:       Physical Exam General:Well developed man, NAD. Room air. Heart:RRR, no murmur Lungs:CTA bilaterally without wheezing, rhonchi or rales Abdomen:Soft, non-tender Extremities:No LE edema Dialysis Access:R IJ [facility]  Additional Objective Labs: Basic Metabolic Panel: Recent Labs  Lab 05/22/20 1144 05/23/20 0743 05/24/20 0339  NA 136 134* 134*  K 4.4 4.1 3.9  CL 99 98 97*  CO2 26 25 25   GLUCOSE 99 207* 186*  BUN 26* 20 30*  CREATININE 4.57* 3.28* 4.61*  CALCIUM 8.8* 8.9 9.3  PHOS 4.6 4.6 6.5*   Liver Function Tests: Recent Labs  Lab 05/22/20 1144 05/23/20 0743 05/24/20 0339  ALBUMIN 1.7* 1.7* 1.7*   CBC: Recent Labs  Lab 05/18/20 0859 05/21/20 0845 05/22/20 1144 05/23/20 0743 05/24/20 0339  WBC 7.6 9.9 8.5 8.5 9.0  HGB 8.1* 8.2* 9.2* 9.1* 9.3*  HCT 26.2* 27.6* 30.7* 30.7* 30.7*  MCV 86.8 88.2 88.0 88.7 87.5  PLT 223 247 285 238 293   Medications: . ceFEPime (MAXIPIME) IV 2 g (05/22/20 1725)  . vancomycin Stopped (05/22/20 1650)   . (feeding supplement) PROSource Plus  30 mL Oral BID BM  . amLODipine  10 mg Oral Daily  . Chlorhexidine Gluconate Cloth  6 each Topical Q0600  . Chlorhexidine Gluconate Cloth  6 each Topical Q0600  . darbepoetin (ARANESP) injection - DIALYSIS  200 mcg Intravenous Q Thu-HD  . famotidine  20 mg Oral QHS  . feeding supplement (NEPRO CARB STEADY)  237 mL Oral BID BM  . heparin injection (subcutaneous)  5,000 Units Subcutaneous Q8H  . insulin aspart  0-5 Units Subcutaneous QHS  . insulin aspart  0-9 Units  Subcutaneous TID WC  . insulin glargine  10 Units Subcutaneous QHS  . metoCLOPramide  5 mg Oral TID AC  . metoprolol tartrate  50 mg Oral BID  . multivitamin  1 tablet Oral QHS  . senna  1 tablet Oral BID  . sodium chloride flush  3 mL Intravenous Q12H    Dialysis Orders: NWTTS 4h 400/800 143.5kg 3K/2.5Ca TDCHeparin3000units IV TIW - Mircera124mcgIVq2wks - last 12/8 - Venofer 100mg IVqHD x10 - completed 6/10 doses  Assessment/Plan: 1. Sacral decub/coccygealosteo: On Vanc/Cefepime x 6 weeks (thru 06/10/20) - ID following.Continues hydrotherapy.CIR placement pending. 2. 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 3. COVID PNA- tested positive on 11/30, is off isolation now. 4. ESRD: Continue HD per TTS schedule - next tomorrow. 5. Hypertension/volume: No edema or SOB, BP stablebut weights up. Continue higher UF goal with HD. Discussed fluid/ice restrictions with patient. 6. Anemiaof CKD- Hgb9.3,getting Aranespq Thursday - dose increased to 273mcg. 7. Secondary Hyperparathyroidism: CorrCaand Phos high.Not on VDRA or binders.Using lowest available Ca bath with HD. Start Renvela 1/meals. 8. Nutrition: Alb very low, continue Nepro + pro-source supplements. 9. DM T2 - per admit   Veneta Penton, PA-C 05/24/2020, 9:37 AM  [facility]

## 2020-05-24 NOTE — Care Management Important Message (Signed)
Important Message  [name] MRN: [id number] Date of Birth: [birth year]   Medicare Important Message Given:  Yes     [name] [name] 05/24/2020, 2:58 PM

## 2020-05-25 DIAGNOSIS — I951 Orthostatic hypotension: Secondary | ICD-10-CM

## 2020-05-25 LAB — BASIC METABOLIC PANEL
Anion gap: 10 (ref 5–15)
BUN: 49 mg/dL — ABNORMAL HIGH (ref 6–20)
CO2: 25 mmol/L (ref 22–32)
Calcium: 8.9 mg/dL (ref 8.9–10.3)
Chloride: 98 mmol/L (ref 98–111)
Creatinine, Ser: 5.67 mg/dL — ABNORMAL HIGH (ref 0.61–1.24)
GFR, Estimated: 12 mL/min — ABNORMAL LOW (ref >60)
Glucose, Bld: 256 mg/dL — ABNORMAL HIGH (ref 70–99)
Potassium: 4 mmol/L (ref 3.5–5.1)
Sodium: 133 mmol/L — ABNORMAL LOW (ref 135–145)

## 2020-05-25 LAB — CBC
HCT: 27.3 % — ABNORMAL LOW (ref 39.0–52.0)
Hemoglobin: 8.6 g/dL — ABNORMAL LOW (ref 13.0–17.0)
MCH: 27.2 pg (ref 26.0–34.0)
MCHC: 31.5 g/dL (ref 30.0–36.0)
MCV: 86.4 fL (ref 80.0–100.0)
Platelets: 269 10*3/uL (ref 150–400)
RBC: 3.16 MIL/uL — ABNORMAL LOW (ref 4.22–5.81)
RDW: 18.1 % — ABNORMAL HIGH (ref 11.5–15.5)
WBC: 8.6 10*3/uL (ref 4.0–10.5)
nRBC: 0 % (ref 0.0–0.2)

## 2020-05-25 LAB — GLUCOSE, CAPILLARY
Glucose-Capillary: 210 mg/dL — ABNORMAL HIGH (ref 70–99)
Glucose-Capillary: 139 mg/dL — ABNORMAL HIGH (ref 70–99)
Glucose-Capillary: 144 mg/dL — ABNORMAL HIGH (ref 70–99)
Glucose-Capillary: 215 mg/dL — ABNORMAL HIGH (ref 70–99)

## 2020-05-25 MED ORDER — INSULIN GLARGINE 100 UNIT/ML ~~LOC~~ SOLN
12.0000 [IU] | Freq: Every day | SUBCUTANEOUS | Status: DC
  Administered 2020-05-25: 12 [IU] via SUBCUTANEOUS
  Filled 2020-05-25 (×2): qty 0.12

## 2020-05-25 MED ORDER — HEPARIN SODIUM (PORCINE) 1000 UNIT/ML DIALYSIS: 20.0000 [IU]/kg | INTRAMUSCULAR | Status: DC | PRN

## 2020-05-25 MED ORDER — HEPARIN SODIUM (PORCINE) 1000 UNIT/ML IJ SOLN
INTRAMUSCULAR | Status: AC
  Filled 2020-05-25: qty 4

## 2020-05-25 MED ORDER — VANCOMYCIN HCL IN DEXTROSE 1-5 GM/200ML-% IV SOLN
INTRAVENOUS | Status: AC
  Administered 2020-05-25: 1000 mg via INTRAVENOUS
  Filled 2020-05-25: qty 200

## 2020-05-25 NOTE — Progress Notes (Signed)
Inpatient Rehab Admissions Coordinator:   Discussed with rehab MD, Dr. Naaman Plummer, today.  Pt still not at a level to where Dr. Naaman Plummer feels he can tolerate CIR.  Will follow for improved mobility with therapy and bed availability.    Shann Medal, PT, DPT Admissions Coordinator [phone] 05/25/20  11:54 AM

## 2020-05-25 NOTE — Progress Notes (Signed)
HD#26 Subjective:  Overnight Events: No acute events overnight  Patient seen at bedside, receiving dialysis. He has no acute events. State he was evaluated by Dr. Sharol Given who recommended patient be seen on outpatient basis for his left 4th toe with gangrene.   Patient states he worked with PT yesterday but upon standing he felt dizzy. He was noted to be orthostatic per PT.   Objective:  Vital signs in last 24 hours: Vitals:   05/24/20 0751 05/24/20 1608 05/24/20 1944 05/25/20 0542  BP: 131/89 (!) 143/82 129/82 126/77  Pulse: 86 90 91 92  Resp: 18 17 15    Temp: 98 F (36.7 C) 97.9 F (36.6 C) 98.6 F (37 C)   TempSrc: Oral Oral Oral   SpO2: 100% 98% 98%   Weight:      Height:       Supplemental O2: Room Air SpO2: 98 % O2 Flow Rate (L/min): 25 L/min   Physical Exam:  Physical Exam Vitals and nursing note reviewed.  Constitutional:      General: He is not in acute distress.    Appearance: He is not ill-appearing, toxic-appearing or diaphoretic.  HENT:     Head: Normocephalic and atraumatic.  Cardiovascular:     Rate and Rhythm: Normal rate and regular rhythm.     Pulses: Normal pulses.     Heart sounds: Normal heart sounds. No murmur heard. No friction rub. No gallop.   Pulmonary:     Effort: Pulmonary effort is normal. No respiratory distress.     Breath sounds: No stridor. No wheezing, rhonchi or rales.  Abdominal:     General: Abdomen is flat. There is no distension.     Tenderness: There is no abdominal tenderness. There is no guarding or rebound.  Musculoskeletal:     Right lower leg: No edema.     Left lower leg: No edema.     Comments: Bilateral foot bandages in place.   Skin:    General: Skin is warm and dry.  Neurological:     General: No focal deficit present.     Mental Status: He is alert and oriented to person, place, and time. Mental status is at baseline.  Psychiatric:        Mood and Affect: Mood normal.        Behavior: Behavior normal.       Filed Weights   05/22/20 1023 05/22/20 1428 05/23/20 0500  Weight: (!) 160.5 kg (!) 156.8 kg (!) 159.7 kg     Intake/Output Summary (Last 24 hours) at 05/25/2020 0659 Last data filed at 05/25/2020 0500 Gross per 24 hour  Intake --  Output 200 ml  Net -200 ml   Net IO Since Admission: -20,223.45 mL [05/25/20 0659]  Pertinent Labs: CBC Latest Ref Rng & Units 05/25/2020 05/24/2020 05/23/2020  WBC 4.0 - 10.5 K/uL 8.6 9.0 8.5  Hemoglobin 13.0 - 17.0 g/dL 8.6(L) 9.3(L) 9.1(L)  Hematocrit 39.0 - 52.0 % 27.3(L) 30.7(L) 30.7(L)  Platelets 150 - 400 K/uL 269 293 238    CMP Latest Ref Rng & Units 05/25/2020 05/24/2020 05/23/2020  Glucose 70 - 99 mg/dL 256(H) 186(H) 207(H)  BUN 6 - 20 mg/dL 49(H) 30(H) 20  Creatinine 0.61 - 1.24 mg/dL 5.67(H) 4.61(H) 3.28(H)  Sodium 135 - 145 mmol/L 133(L) 134(L) 134(L)  Potassium 3.5 - 5.1 mmol/L 4.0 3.9 4.1  Chloride 98 - 111 mmol/L 98 97(L) 98  CO2 22 - 32 mmol/L 25 25 25   Calcium 8.9 -  10.3 mg/dL 8.9 9.3 8.9  Total Protein 6.5 - 8.1 g/dL - - -  Total Bilirubin 0.3 - 1.2 mg/dL - - -  Alkaline Phos 38 - 126 U/L - - -  AST 15 - 41 U/L - - -  ALT 0 - 44 U/L - - -    Imaging: No results found.  Assessment/Plan:   Principal Problem:   Sacral osteomyelitis (HCC) Active Problems:   Insulin dependent type 2 diabetes mellitus (HCC)   HTN (hypertension)   History of anemia due to chronic kidney disease   Pressure injury of both heels, unstageable ([place])   Pneumonia due to COVID-19 virus   ESRD (end stage renal disease) (HCC)   Avascular necrosis of first metatarsal, left foot (HCC)   Osteomyelitis of dorsal first metatarsal, left foot ([place])   Decubitus ulcer of sacral region, unstageable ([place])   Morbid obesity ([place])   Patient Summary: [name] is 50yo male with ESRD, hypertension, insulin-depending type II diabetes mellitus admitted 04/29/20 for sacral decubitus ulcer and COVID-19 pneumonia, continuing to progress well on IV antibiotics, awaiting  CIR bed placement.   #Unstageable sacral decubitus ulcer #Coccygeal osteomyelitis Remains on long term antibiotics. Currently awaiting bed placement per CIR. Pending Case management update.  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 Sharol Given for progression of dry gangrene of left 4th toe. Per patient, Dr. Sharol Given would like him to follow up on outpatient basis. - follow up with ortho outpatient - C/w antibiotics - C/w wound care, Prevalon boots  #ESRD on HD Will resume TTS scheduling for HD. Nephrology following. -currently receiving HD -daily BMP  #Hypertension Orthostatic when evaluated by PT yesterday. Discontinued home amlodipine for now.  - DC amlodipine. Restart once patient no longer hypotensive  #Deconditioning #Generalized weakness PT session yesterday, patient was found to be significantly orthostatic. Home amlodipine canceled. Will await further updates from case management and rehab coordinator.  - continue feeding supplements for nutrition - Continue to move patient to chair twice daily - PT/OT - Pending CIR placement  Diet: Carb-Modified IVF: None,None VTE: Heparin Code: Full PT/OT recs: CIR  Dispo: Anticipated discharge to Skilled nursing facility in 1-2 days pending CIR bed availability.  Sanjuana Letters DO Internal Medicine Resident PGY-1 Pager [phone] Please contact the on call pager after 5 pm and on weekends at [phone].

## 2020-05-25 NOTE — Plan of Care (Signed)
  Problem: Activity: Goal: Risk for activity intolerance will decrease Outcome: Progressing   Problem: Skin Integrity: Goal: Risk for impaired skin integrity will decrease Outcome: Progressing   

## 2020-05-25 NOTE — Procedures (Signed)
I was present at this dialysis session. I have reviewed the session itself and made appropriate changes.   3K UF goal 4L.  NO c/o.TDC>     Filed Weights   05/22/20 1428 05/23/20 0500 05/25/20 0710  Weight: (!) 156.8 kg (!) 159.7 kg (!) 154.2 kg    Recent Labs  Lab 05/24/20 0339 05/25/20 0309  NA 134* 133*  K 3.9 4.0  CL 97* 98  CO2 25 25  GLUCOSE 186* 256*  BUN 30* 49*  CREATININE 4.61* 5.67*  CALCIUM 9.3 8.9  PHOS 6.5*  --     Recent Labs  Lab 05/23/20 0743 05/24/20 0339 05/25/20 0309  WBC 8.5 9.0 8.6  HGB 9.1* 9.3* 8.6*  HCT 30.7* 30.7* 27.3*  MCV 88.7 87.5 86.4  PLT 238 293 269    Scheduled Meds: . (feeding supplement) PROSource Plus  30 mL Oral TID BM  . Chlorhexidine Gluconate Cloth  6 each Topical Q0600  . Chlorhexidine Gluconate Cloth  6 each Topical Q0600  . darbepoetin (ARANESP) injection - DIALYSIS  200 mcg Intravenous Q Thu-HD  . famotidine  20 mg Oral QHS  . feeding supplement (NEPRO CARB STEADY)  237 mL Oral BID BM  . heparin injection (subcutaneous)  5,000 Units Subcutaneous Q8H  . heparin sodium (porcine)      . insulin aspart  0-5 Units Subcutaneous QHS  . insulin aspart  0-9 Units Subcutaneous TID WC  . insulin glargine  10 Units Subcutaneous QHS  . metoCLOPramide  5 mg Oral TID AC  . metoprolol tartrate  50 mg Oral BID  . multivitamin  1 tablet Oral QHS  . polyethylene glycol  17 g Oral Daily  . senna  1 tablet Oral BID  . sevelamer carbonate  800 mg Oral TID WC  . sodium chloride flush  3 mL Intravenous Q12H   Continuous Infusions: . ceFEPime (MAXIPIME) IV 2 g (05/24/20 1704)  . vancomycin    . vancomycin Stopped (05/22/20 1650)   PRN Meds:.acetaminophen, heparin, heparin sodium (porcine), ondansetron **OR** ondansetron (ZOFRAN) IV, oxyCODONE-acetaminophen   Pearson Grippe  MD 05/25/2020, 8:33 AM

## 2020-05-25 NOTE — Progress Notes (Signed)
Occupational Therapy Treatment Patient Details Name: Fred [name] MRN: [id number] DOB: 12/18/1970 Today's Date: 05/25/2020    History of present illness Pt is a 50 y.o. male admitted from [facility] SN[facility] on 04/29/20 for evaluation of low oxygen saturations 82% on room air; concern for sepsis form Covid PNA, sacral wound and foot wounds. 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 Of note, pt recently admitted to [facility] due to gallbladder problems then d/c to [facility](14 days) for rehab then readmitted to [facility] then d/c to [facility] for rehab. PMH includes ESRD on HD (started 9/21), HTN, DM, C. diff.   OT comments  Pt progressing gradually towards OT goals, remains motivated to participate after dialysis today. Pt continues to be limited by orthostatic BP readings (see below). Planned to attempt standing trials today. However, pt sat EOB < 2 minutes before reporting lightheadedness too much with decreased responsiveness, so assisted in returning pt to supine. Once in bed, [name] normalized. Pt able to tolerate chair position in bed to improve tolerance to upright positioning. Pt able to return demo UE HEP using resistive band after minimal cues for proper body mechanics. Plan to further assess OOB activities tomorrow on non-dialysis day to assess pt response.   Orthostatic BP readings Lying  120/86 (97)  Sitting  113/75 (88)  Sitting >1 min  96/64 (76)  Return to supine  133/77 (88)     Follow Up Recommendations  CIR;Supervision/Assistance - 24 hour    Equipment Recommendations  3 in 1 bedside commode;Wheelchair (measurements OT);Wheelchair cushion (measurements OT);Hospital bed    Recommendations for Other Services Rehab consult    Precautions / Restrictions Precautions Precautions: Fall Precaution Comments: wounds on bilat feet and L sacrum, monitor orthostatic BPs Required Braces or Orthoses: Other Brace Other  Brace: B prevalon boots Restrictions Weight Bearing Restrictions: No       Mobility Bed Mobility Overal bed mobility: Needs Assistance Bed Mobility: Rolling;Sidelying to Sit;Sit to Sidelying Rolling: Modified independent (Device/Increase time) Sidelying to sit: Min assist     Sit to sidelying: Max assist General bed mobility comments: Min A for trunk and advancement of torso to R side using footboard to gain balance. Max A for return to supine due to dropping BP  Transfers                 General transfer comment: planned to attempt but deferred due to decreasing BP and decreased responsiveness    Balance Overall balance assessment: Needs assistance Sitting-balance support: Feet supported;Single extremity supported;Bilateral upper extremity supported Sitting balance-Leahy Scale: Fair                                     ADL either performed or assessed with clinical judgement   ADL Overall ADL's : Needs assistance/impaired                     Lower Body Dressing: Maximal assistance;Bed level Lower Body Dressing Details (indicate cue type and reason): Pt able to form figure four position in bed but could not bring feet close enough to don socks. decreased coordination/control of B LE               General ADL Comments: Continues to be limited by decreased strength, dropping BP with activities     Vision   Vision Assessment?: No apparent visual deficits Additional Comments:  Does report colors (green, yellow) becoming brighter sitting EOB   Perception     Praxis      Cognition Arousal/Alertness: Awake/alert Behavior During Therapy: WFL for tasks assessed/performed;Flat affect Overall Cognitive Status: Within Functional Limits for tasks assessed                                 General Comments: Pt pleasant and motivated to complete all tasks.        Exercises Exercises: General Upper Extremity General Exercises -  Upper Extremity Shoulder Flexion: Strengthening;20 reps;Theraband;Both Theraband Level (Shoulder Flexion): Level 2 (Red) Shoulder Horizontal ABduction: Strengthening;Both;20 reps;Theraband Theraband Level (Shoulder Horizontal Abduction): Level 2 (Red) Elbow Flexion: Strengthening;Both;20 reps;Theraband Theraband Level (Elbow Flexion): Level 2 (Red) Elbow Extension: Strengthening;Both;20 reps;Theraband Theraband Level (Elbow Extension): Level 2 (Red)   Shoulder Instructions       General Comments Pt continues to demo orthostatic BP readings during activities impacting safety in progression of tasks. Provided UE HEP and instructed pt in its use. Encouraged chair position throughout the day in bed to improve tolerance to upright positioning.    Pertinent Vitals/ Pain       Pain Assessment: No/denies pain  Home Living                                          Prior Functioning/Environment              Frequency  Min 3X/week        Progress Toward Goals  OT Goals(current goals can now be found in the care plan section)  Progress towards OT goals: Progressing toward goals  Acute Rehab OT Goals Patient Stated Goal: Heal wounds; be able to walk outside OT Goal Formulation: With patient Time For Goal Achievement: 06/08/20 Potential to Achieve Goals: Good ADL Goals Pt Will Perform Eating: with modified independence;sitting Pt Will Perform Grooming: with set-up;sitting Pt Will Perform Upper Body Bathing: with set-up;sitting Pt Will Perform Lower Body Bathing: with mod assist;bed level;sitting/lateral leans Pt Will Transfer to Toilet: squat pivot transfer;bedside commode;with mod assist Additional ADL Goal #1: Pt will tolerate sitting up in recliner for 4 hours a day to improve ADL activity tolerance  Plan Discharge plan remains appropriate    Co-evaluation                 AM-PAC OT "6 Clicks" Daily Activity     Outcome Measure   Help from  another person eating meals?: A Little Help from another person taking care of personal grooming?: A Little Help from another person toileting, which includes using toliet, bedpan, or urinal?: Total Help from another person bathing (including washing, rinsing, drying)?: A Lot Help from another person to put on and taking off regular upper body clothing?: A Little Help from another person to put on and taking off regular lower body clothing?: A Lot 6 Click Score: 14    End of Session    OT Visit Diagnosis: Other abnormalities of gait and mobility (R26.89);Muscle weakness (generalized) (M62.81);Other symptoms and signs involving cognitive function;Dizziness and giddiness (R42)   Activity Tolerance Treatment limited secondary to medical complications (Comment)   Patient Left in bed;with call bell/phone within reach   Nurse Communication Mobility status;Other (comment) (BP readings)        Time: 3382-5053 OT Time Calculation (min): 48 min  Charges: OT General Charges $OT Visit: 1 Visit OT Treatments $Self Care/Home Management : 8-22 mins $Therapeutic Activity: 8-22 mins $Therapeutic Exercise: 8-22 mins  [name], OTR/L   [name] 05/25/2020, 3:08 PM

## 2020-05-26 DIAGNOSIS — S91309A Unspecified open wound, unspecified foot, initial encounter: Secondary | ICD-10-CM | POA: Diagnosis not present

## 2020-05-26 LAB — CBC
HCT: 29 % — ABNORMAL LOW (ref 39.0–52.0)
Hemoglobin: 9 g/dL — ABNORMAL LOW (ref 13.0–17.0)
MCH: 27.2 pg (ref 26.0–34.0)
MCHC: 31 g/dL (ref 30.0–36.0)
MCV: 87.6 fL (ref 80.0–100.0)
Platelets: 257 10*3/uL (ref 150–400)
RBC: 3.31 MIL/uL — ABNORMAL LOW (ref 4.22–5.81)
RDW: 18.7 % — ABNORMAL HIGH (ref 11.5–15.5)
WBC: 9.4 10*3/uL (ref 4.0–10.5)
nRBC: 0 % (ref 0.0–0.2)

## 2020-05-26 LAB — BASIC METABOLIC PANEL
Anion gap: 13 (ref 5–15)
BUN: 27 mg/dL — ABNORMAL HIGH (ref 6–20)
CO2: 24 mmol/L (ref 22–32)
Calcium: 8.6 mg/dL — ABNORMAL LOW (ref 8.9–10.3)
Chloride: 98 mmol/L (ref 98–111)
Creatinine, Ser: 3.99 mg/dL — ABNORMAL HIGH (ref 0.61–1.24)
GFR, Estimated: 18 mL/min — ABNORMAL LOW (ref >60)
Glucose, Bld: 228 mg/dL — ABNORMAL HIGH (ref 70–99)
Potassium: 4.2 mmol/L (ref 3.5–5.1)
Sodium: 135 mmol/L (ref 135–145)

## 2020-05-26 LAB — GLUCOSE, CAPILLARY
Glucose-Capillary: 146 mg/dL — ABNORMAL HIGH (ref 70–99)
Glucose-Capillary: 216 mg/dL — ABNORMAL HIGH (ref 70–99)
Glucose-Capillary: 317 mg/dL — ABNORMAL HIGH (ref 70–99)
Glucose-Capillary: 234 mg/dL — ABNORMAL HIGH (ref 70–99)

## 2020-05-26 MED ORDER — INSULIN GLARGINE 100 UNIT/ML ~~LOC~~ SOLN
14.0000 [IU] | Freq: Every day | SUBCUTANEOUS | Status: DC
  Administered 2020-05-26 – 2020-06-05 (×10): 14 [IU] via SUBCUTANEOUS
  Filled 2020-05-26 (×12): qty 0.14

## 2020-05-26 NOTE — Progress Notes (Signed)
[facility] Progress Note   Subjective:  Seen in room - no complaints, did well with HD yesterday. Per notes, had orthostatic hypotension while working with PT, so amlodipine was discontinued.  Objective Vitals:   05/25/20 1112 05/25/20 1201 05/25/20 2016 05/26/20 0838  BP:  122/89 129/75 113/86  Pulse:  86 88 88  Resp: 18 17 15 18   Temp: 98.6 F (37 C) 97.8 F (36.6 C) 98.4 F (36.9 C) 97.7 F (36.5 C)  TempSrc: Oral Oral Oral Oral  SpO2: 98% 100% 98% 97%  Weight: (!) 150.6 kg     Height:       Physical Exam General:Well developed man, NAD. Room air. Lying on side. Heart:RRR, no murmur Lungs:CTA bilaterally without wheezing, rhonchi or rales Abdomen:Soft, non-tender Extremities:No LE edema Dialysis Access:R [facility]  Additional Objective Labs: Basic Metabolic Panel: Recent Labs  Lab 05/22/20 1144 05/23/20 0743 05/24/20 0339 05/25/20 0309 05/26/20 0149  NA 136 134* 134* 133* 135  K 4.4 4.1 3.9 4.0 4.2  CL 99 98 97* 98 98  CO2 26 25 25 25 24   GLUCOSE 99 207* 186* 256* 228*  BUN 26* 20 30* 49* 27*  CREATININE 4.57* 3.28* 4.61* 5.67* 3.99*  CALCIUM 8.8* 8.9 9.3 8.9 8.6*  PHOS 4.6 4.6 6.5*  --   --    Liver Function Tests: Recent Labs  Lab 05/22/20 1144 05/23/20 0743 05/24/20 0339  ALBUMIN 1.7* 1.7* 1.7*   CBC: Recent Labs  Lab 05/22/20 1144 05/23/20 0743 05/24/20 0339 05/25/20 0309 05/26/20 0149  WBC 8.5 8.5 9.0 8.6 9.4  HGB 9.2* 9.1* 9.3* 8.6* 9.0*  HCT 30.7* 30.7* 30.7* 27.3* 29.0*  MCV 88.0 88.7 87.5 86.4 87.6  PLT 285 238 293 269 257   Medications: . ceFEPime (MAXIPIME) IV 2 g (05/25/20 1828)  . vancomycin 1,000 mg (05/25/20 1009)   . (feeding supplement) PROSource Plus  30 mL Oral TID BM  . Chlorhexidine Gluconate Cloth  6 each Topical Q0600  . Chlorhexidine Gluconate Cloth  6 each Topical Q0600  . darbepoetin (ARANESP) injection - DIALYSIS  200 mcg Intravenous Q Thu-HD  . famotidine  20 mg Oral QHS  . feeding  supplement (NEPRO CARB STEADY)  237 mL Oral BID BM  . heparin injection (subcutaneous)  5,000 Units Subcutaneous Q8H  . insulin aspart  0-5 Units Subcutaneous QHS  . insulin aspart  0-9 Units Subcutaneous TID WC  . insulin glargine  14 Units Subcutaneous QHS  . metoCLOPramide  5 mg Oral TID AC  . metoprolol tartrate  50 mg Oral BID  . multivitamin  1 tablet Oral QHS  . polyethylene glycol  17 g Oral Daily  . senna  1 tablet Oral BID  . sevelamer carbonate  800 mg Oral TID WC  . sodium chloride flush  3 mL Intravenous Q12H    Dialysis Orders: NWTTS 4h 400/800 143.5kg 3K/2.5Ca TDCHeparin3000units IV TIW - Mircera134mcgIVq2wks - last 12/8 - Venofer 100mg IVqHD x10 - completed 6/10 doses  Assessment/Plan: 1. Sacral decub/coccygealosteo: On Vanc/Cefepime x 6 weeks (thru 06/10/20) - ID following.Continues hydrotherapy.Placement pending. 2. 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 3. COVID PNA- tested positive on 11/30, is off isolation now. 4. ESRD: Continue HD per TTS schedule - next 1/6. 5. BP/volume: No edemaor SOB, some hypotension yesterday and amlodipine d/c'd - follow. 6. Anemiaof CKD- Hgb9,getting Aranespq Thursday - dose increased to 248mcg. 7. Secondary Hyperparathyroidism: CorrCaand Phos high.Not on VDRA.Using lowest available Ca bath with  HD.Started Renvela 1/meals. 8. Nutrition: Alb very low, continue Nepro + pro-source supplements. 9. DM T2 - per admit   Veneta Penton, PA-C 05/26/2020, 10:34 AM  [facility]

## 2020-05-26 NOTE — Progress Notes (Incomplete)
Inpatient Diabetes Program Recommendations  AACE/ADA: New Consensus Statement on Inpatient Glycemic Control (2015)  Target Ranges:  Prepandial:   less than 140 mg/dL      Peak postprandial:   less than 180 mg/dL (1-2 hours)      Critically ill patients:  140 - 180 mg/dL   Lab Results  Component Value Date   GLUCAP 216 (H) 05/26/2020   HGBA1C 5.6 05/04/2020    Review of Glycemic Control    Inpatient Diabetes Program Recommendations:

## 2020-05-26 NOTE — Progress Notes (Signed)
Nutrition Follow-up  DOCUMENTATION CODES:   Morbid obesity  INTERVENTION:   -Continue renal MVI daily -Continue 30 ml Prosource Plus TID, each supplement provides 100 kcals and 15 grams protein -Continue Nepro Shake po BID, each supplement provides 425 kcal and 19 grams protein -Continue double protein portions with each meal  NUTRITION DIAGNOSIS:   Increased nutrient needs related to wound healing as evidenced by estimated needs.  Ongoing  GOAL:   Patient will meet greater than or equal to 90% of their needs  Progressing   MONITOR:   PO intake,Supplement acceptance,Labs,Weight trends,Skin,I & O's  REASON FOR ASSESSMENT:   LOS    ASSESSMENT:   50 y/o male with  history of HTN, DM on insulin, ESRD, Anemia who presents from [facility] because the staff stated that he wasnt eating the way he usually does with recent diagnosis of COVID on 03/24/2020 admitted for COVID pneumonia.  12/11- hydrotherapy initiated 12/29- last hyrdotherapy treatment  Reviewed I/O's: -3.5 L x 24 hours and -18.4 L since 05/12/20  UOP: 0 ml x 24 hours  Spoke with pt at bedside, who was pleasant and in good spirits today. He reports that he has had a good appetite and is consuming 100% of his meals. Observed lunch meal, which was 100% completed. Per pt, he sometimes skips breakfast due to sleeping late, but always eats all of his lunch and dinner. Per pt, he does not care for the taste of the Prosource and Nepro supplements, but he is willing to take them to help him get better and heal his wounds.   Pt reports a general decline in health over the past 3-4 months. Pt explains that he was hospitalized at a hospital in [place] for gallbladder issues and has been bedbound ever since. Per pt, he was transferred to SNF after hospitalization and contracted COVID at SNF. He was admitted to hospital about a month ago for COVID symptoms. Pt shares that he has decreased appetite during COVID diagnosis,  but this has improved.   Pt denies any weight loss.   Discussed with pt importance of good meal and supplement intake to promote wound healing. Pt expressed frustration of prolonged hospital stay, but is willing to do what is needed "to help me get better". Reviewed current nutrition interventions with pt and provided emotional support. Pt expressed appreciation for visit.   Medications reviewed and include aranesp, reglan, miralax, senokot, and renvela.   Labs reviewed: CBGS: 144-216 (inpatient orders for glycemic control are 0-9 units insulin aspart TID with meals, 0-5 units insulin aspart daily at bedtime, and 14 units insulin glargine daily at bedtime).   NUTRITION - FOCUSED PHYSICAL EXAM:  Flowsheet Row Most Recent Value  Orbital Region No depletion  Upper Arm Region Moderate depletion  Thoracic and Lumbar Region No depletion  Buccal Region No depletion  Temple Region No depletion  Clavicle Bone Region No depletion  Clavicle and Acromion Bone Region No depletion  Scapular Bone Region No depletion  Dorsal Hand Mild depletion  Patellar Region Moderate depletion  Anterior Thigh Region Moderate depletion  Posterior Calf Region Moderate depletion  Edema (RD Assessment) Mild  Hair Reviewed  Eyes Reviewed  Mouth Reviewed  Skin Reviewed  Nails Reviewed       Diet Order:   Diet Order            Diet Carb Modified Fluid consistency: Thin; Room service appropriate? Yes; Fluid restriction: 1200 mL Fluid  Diet effective now  EDUCATION NEEDS:   No education needs have been identified at this time  Skin:  Skin Assessment: Skin Integrity Issues: Skin Integrity Issues:: Stage IV,Unstageable Stage IV: buttocks Unstageable: lt lateral ankle, lt posterior foot, rt medial foot, rt heel  Last BM:  05/21/20  Height:   Ht Readings from Last 1 Encounters:  04/29/20 6\' 6"  (1.981 m)    Weight:   Wt Readings from Last 1 Encounters:  05/25/20 (!) 150.6 kg     Ideal Body Weight:  97.3 kg  BMI:  Body mass index is 38.37 kg/m.  Estimated Nutritional Needs:   Kcal:  2703-5009  Protein:  165-190 grams  Fluid:  1.2 L    Loistine Chance, RD, LDN, [place] Registered Dietitian II Certified Diabetes Care and Education Specialist Please refer to [place] for RD and/or RD on-call/weekend/after hours pager

## 2020-05-26 NOTE — Progress Notes (Signed)
Patient ID: Fred [name], male   DOB: 1971/03/27, 50 y.o.   MRN: [id number] Patient is seen in follow-up for both lower extremities.  There are ischemic ulcers on the left foot fourth toe and fifth metatarsal head and the right heel and fifth metatarsal head.  These are stable there is no ascending cellulitis patient is not interested in surgical intervention.  Continue the PRAFO boots to protect the heels I will follow-up as an outpatient.

## 2020-05-26 NOTE — Progress Notes (Signed)
Physical Therapy Treatment Patient Details Name: Fred [name] MRN: [id number] DOB: 11/18/1970 Today's Date: 05/26/2020    History of Present Illness Pt is a 50 y.o. male admitted from [facility] on 04/29/20 for evaluation of low oxygen saturations 82% on room air; concern for sepsis form Covid PNA, sacral wound and foot wounds. 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 Of note, pt recently admitted to [facility] due to gallbladder problems then d/c to [facility](14 days) for rehab then readmitted to [facility] then d/c to [facility] for rehab. PMH includes ESRD on HD (started 9/21), HTN, DM, C. diff.    PT Comments    Patient progressing towards physical therapy goals, however continues to be orthostatic. Patient performed sit to stand x 3 with last 2 trials successful and modA+2 and use of STEDY. Patient with symptomatic orthostasis  (see below). Patient is motivated to work with therapy to return to PLOF. Patient continues to be limited by orthostatics, impaired balance, decreased activity tolerance, generalized weakness, and impaired functional mobility. Continue to recommend comprehensive inpatient rehab (CIR) for post-acute therapy needs.   Orthostatic BPs  Supine 117/67  Sitting 114/82  Standing 88/55 with no symptoms until returning to sitting   Sitting  97/81 - complaints of dizziness and "feeling like I'm going to black out"  Sitting after 3 minutes 127/86  2nd stand trial - BP taken in sitting 92/73 with symptoms of dizziness and "back of head pounding", requests to sit down      Follow Up Recommendations  CIR;Supervision for mobility/OOB     Equipment Recommendations  Wheelchair (measurements PT);Wheelchair cushion (measurements PT);Hospital bed;Other (comment) (hoyer lift)    Recommendations for Other Services       Precautions / Restrictions Precautions Precautions: Fall Precaution Comments: wounds  on bilat feet and L sacrum, monitor orthostatic BPs Required Braces or Orthoses: Other Brace Other Brace: B prevalon boots Restrictions Weight Bearing Restrictions: No    Mobility  Bed Mobility Overal bed mobility: Needs Assistance Bed Mobility: Rolling;Sidelying to Sit;Sit to Sidelying Rolling: Modified independent (Device/Increase time) Sidelying to sit: Min guard     Sit to sidelying: Supervision    Transfers Overall transfer level: Needs assistance Equipment used: Ambulation equipment used Transfers: Sit to/from Stand Sit to Stand: Mod assist;+2 safety/equipment;+2 physical assistance         General transfer comment: see note for orthostatic vitals. Sit to stand x 3 with STEDY and modA+2  Ambulation/Gait                 Stairs             Wheelchair Mobility    Modified Rankin (Stroke Patients Only)       Balance Overall balance assessment: Needs assistance Sitting-balance support: Feet supported;Single extremity supported Sitting balance-Leahy Scale: Fair     Standing balance support: Bilateral upper extremity supported Standing balance-Leahy Scale: Poor Standing balance comment: reliant on UE support                            Cognition Arousal/Alertness: Awake/alert Behavior During Therapy: WFL for tasks assessed/performed;Flat affect Overall Cognitive Status: Within Functional Limits for tasks assessed                                        Exercises  General Comments        Pertinent Vitals/Pain Pain Assessment: Faces Faces Pain Scale: No hurt    Home Living                      Prior Function            PT Goals (current goals can now be found in the care plan section) Acute Rehab PT Goals Patient Stated Goal: be able to stand more and take steps PT Goal Formulation: With patient Time For Goal Achievement: 06/09/20 Potential to Achieve Goals: Fair Progress towards PT goals:  Progressing toward goals    Frequency    Min 3X/week      PT Plan Current plan remains appropriate    Co-evaluation              AM-PAC PT "6 Clicks" Mobility   Outcome Measure  Help needed turning from your back to your side while in a flat bed without using bedrails?: A Little Help needed moving from lying on your back to sitting on the side of a flat bed without using bedrails?: A Little Help needed moving to and from a bed to a chair (including a wheelchair)?: Total Help needed standing up from a chair using your arms (e.g., wheelchair or bedside chair)?: A Lot Help needed to walk in hospital room?: Total Help needed climbing 3-5 steps with a railing? : Total 6 Click Score: 11    End of Session Equipment Utilized During Treatment: Gait belt Activity Tolerance: Patient tolerated treatment well Patient left: in bed;with call bell/phone within reach;with bed alarm set (patient in chair position) Nurse Communication: Mobility status PT Visit Diagnosis: Muscle weakness (generalized) (M62.81);Difficulty in walking, not elsewhere classified (R26.2)     Time: 5003-7048 PT Time Calculation (min) (ACUTE ONLY): 28 min  Charges:  $Therapeutic Activity: 23-37 mins                     Linnaea Ahn A. Gilford Rile PT, DPT Acute Rehabilitation Services Pager [phone] Office [phone]    Alda Lea 05/26/2020, 3:01 PM

## 2020-05-26 NOTE — Progress Notes (Signed)
HD#27 Subjective:   No acute events overnight.  Patient seen at bedside. States he still felt slightly woozy when standing with physical therapy yesterday. Feels fine for the first minute but then becomes lightheaded. Discussed challenge of fluid balance given his kidney dysfunction. Discussed increasing his insulin. Also counseled regarding the importance of using the incentive spirometer.   Objective:  Vital signs in last 24 hours: Vitals:   05/25/20 1112 05/25/20 1201 05/25/20 2016 05/26/20 0838  BP:  122/89 129/75 113/86  Pulse:  86 88 88  Resp: 18 17 15 18   Temp: 98.6 F (37 C) 97.8 F (36.6 C) 98.4 F (36.9 C) 97.7 F (36.5 C)  TempSrc: Oral Oral Oral Oral  SpO2: 98% 100% 98% 97%  Weight: (!) 150.6 kg     Height:       Supplemental O2: Room Air SpO2: 97 % O2 Flow Rate (L/min): 25 L/min   Physical Exam:  Physical Exam Vitals and nursing note reviewed.  Constitutional:      General: He is not in acute distress.    Appearance: He is not ill-appearing, toxic-appearing or diaphoretic.  HENT:     Head: Normocephalic and atraumatic.  Cardiovascular:     Rate and Rhythm: Normal rate and regular rhythm.     Pulses: Normal pulses.     Heart sounds: Normal heart sounds. No murmur heard. No friction rub. No gallop.   Pulmonary:     Effort: Pulmonary effort is normal. No respiratory distress.     Breath sounds: No stridor. No wheezing, rhonchi or rales.  Abdominal:     General: Abdomen is flat. There is no distension.     Tenderness: There is no abdominal tenderness. There is no guarding or rebound.  Musculoskeletal:     Right lower leg: No edema.     Left lower leg: No edema.     Comments: Bilateral foot bandages in place.   Skin:    General: Skin is warm and dry.  Neurological:     General: No focal deficit present.     Mental Status: He is alert and oriented to person, place, and time. Mental status is at baseline.  Psychiatric:        Mood and Affect: Mood  normal.        Behavior: Behavior normal.    Filed Weights   05/23/20 0500 05/25/20 0710 05/25/20 1112  Weight: (!) 159.7 kg (!) 154.2 kg (!) 150.6 kg     Intake/Output Summary (Last 24 hours) at 05/26/2020 1006 Last data filed at 05/26/2020 0900 Gross per 24 hour  Intake 240 ml  Output 3500 ml  Net -3260 ml   Net IO Since Admission: -23,483.45 mL [05/26/20 1006]  Pertinent Labs: CBC Latest Ref Rng & Units 05/26/2020 05/25/2020 05/24/2020  WBC 4.0 - 10.5 K/uL 9.4 8.6 9.0  Hemoglobin 13.0 - 17.0 g/dL 9.0(L) 8.6(L) 9.3(L)  Hematocrit 39.0 - 52.0 % 29.0(L) 27.3(L) 30.7(L)  Platelets 150 - 400 K/uL 257 269 293    CMP Latest Ref Rng & Units 05/26/2020 05/25/2020 05/24/2020  Glucose 70 - 99 mg/dL 228(H) 256(H) 186(H)  BUN 6 - 20 mg/dL 27(H) 49(H) 30(H)  Creatinine 0.61 - 1.24 mg/dL 3.99(H) 5.67(H) 4.61(H)  Sodium 135 - 145 mmol/L 135 133(L) 134(L)  Potassium 3.5 - 5.1 mmol/L 4.2 4.0 3.9  Chloride 98 - 111 mmol/L 98 98 97(L)  CO2 22 - 32 mmol/L 24 25 25   Calcium 8.9 - 10.3 mg/dL 8.6(L) 8.9 9.3  Total Protein 6.5 - 8.1 g/dL - - -  Total Bilirubin 0.3 - 1.2 mg/dL - - -  Alkaline Phos 38 - 126 U/L - - -  AST 15 - 41 U/L - - -  ALT 0 - 44 U/L - - -    Imaging: No results found.  Assessment/Plan:   Principal Problem:   Sacral osteomyelitis (HCC) Active Problems:   Insulin dependent type 2 diabetes mellitus (HCC)   HTN (hypertension)   History of anemia due to chronic kidney disease   Pressure injury of both heels, unstageable ([place])   Pneumonia due to COVID-19 virus   ESRD (end stage renal disease) (HCC)   Avascular necrosis of first metatarsal, left foot (HCC)   Osteomyelitis of dorsal first metatarsal, left foot ([place])   Decubitus ulcer of sacral region, unstageable ([place])   Morbid obesity ([place])   Patient Summary: Fred [name] is 50yo male with ESRD, hypertension, insulin-depending type II diabetes mellitus admitted 04/29/20 for sacral decubitus ulcer and COVID-19 pneumonia,  continuing to progress well on IV antibiotics, awaiting CIR bed placement.   #Unstageable sacral decubitus ulcer #Coccygeal osteomyelitis Remains on long term antibiotics. Currently awaiting bed placement per CIR. Discussed patient's case with PT who suspect his hypotension is contributing and limiting his progress.  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 Sharol Given for progression of dry gangrene of left 4th toe. Per patient, Dr. Sharol Given would like him to follow up on outpatient basis. - follow up with ortho outpatient - C/w antibiotics - C/w wound care, Prevalon boots  #ESRD on HD Will resume TTS scheduling for HD. Nephrology following. Discussed with nephro APP concerning patient's hypotension. She noted will attempt to keep patient even or slightly positive in setting of his orthostatic hypotension.  -currently receiving HD -daily BMP  #Hx of Hypertension #Orthostatic Patient continues to be orthostatic when working with PT. Discontinued home amlodipine for now. Will DC metoprolol as well as it may be contributing to lack of cardiac response.  - DC amlodipine and metoprolol. Restart once patient no longer hypotensive   #Deconditioning #Generalized weakness PT session yesterday, patient was found to be significantly orthostatic. Home amlodipine canceled. Will await further updates from case management and rehab coordinator.  - continue feeding supplements for nutrition - Continue to move patient to chair twice daily - PT/OT - Pending CIR placement  Hx of Type II DM Patient has had elevated morning glucose readings  over 200. Increased lantus from 10 to 12 yesterday. Because still elevated, will increase lantus dose to 14 units nightly - SSI with meals - nightly lantus increased to 14 units  Diet: Carb-Modified IVF: None,None VTE: Heparin Code: Full PT/OT recs: CIR  Dispo: Anticipated discharge to Skilled nursing facility in 1-2 days pending CIR bed availability.  Sanjuana Letters DO Internal Medicine Resident PGY-1 Pager [phone] Please contact the on call pager after 5 pm and on weekends at [phone].

## 2020-05-27 DIAGNOSIS — E1165 Type 2 diabetes mellitus with hyperglycemia: Secondary | ICD-10-CM

## 2020-05-27 LAB — BASIC METABOLIC PANEL
Anion gap: 11 (ref 5–15)
BUN: 39 mg/dL — ABNORMAL HIGH (ref 6–20)
CO2: 25 mmol/L (ref 22–32)
Calcium: 9 mg/dL (ref 8.9–10.3)
Chloride: 98 mmol/L (ref 98–111)
Creatinine, Ser: 5.81 mg/dL — ABNORMAL HIGH (ref 0.61–1.24)
GFR, Estimated: 11 mL/min — ABNORMAL LOW (ref >60)
Glucose, Bld: 225 mg/dL — ABNORMAL HIGH (ref 70–99)
Potassium: 4.2 mmol/L (ref 3.5–5.1)
Sodium: 134 mmol/L — ABNORMAL LOW (ref 135–145)

## 2020-05-27 LAB — GLUCOSE, CAPILLARY
Glucose-Capillary: 149 mg/dL — ABNORMAL HIGH (ref 70–99)
Glucose-Capillary: 192 mg/dL — ABNORMAL HIGH (ref 70–99)

## 2020-05-27 LAB — CBC
HCT: 29.3 % — ABNORMAL LOW (ref 39.0–52.0)
Hemoglobin: 9.2 g/dL — ABNORMAL LOW (ref 13.0–17.0)
MCH: 27.2 pg (ref 26.0–34.0)
MCHC: 31.4 g/dL (ref 30.0–36.0)
MCV: 86.7 fL (ref 80.0–100.0)
Platelets: 256 10*3/uL (ref 150–400)
RBC: 3.38 MIL/uL — ABNORMAL LOW (ref 4.22–5.81)
RDW: 18.4 % — ABNORMAL HIGH (ref 11.5–15.5)
WBC: 10.1 10*3/uL (ref 4.0–10.5)
nRBC: 0 % (ref 0.0–0.2)

## 2020-05-27 MED ORDER — DARBEPOETIN ALFA 200 MCG/0.4ML IJ SOSY
PREFILLED_SYRINGE | INTRAMUSCULAR | Status: AC
  Administered 2020-05-27: 200 ug via INTRAVENOUS
  Filled 2020-05-27: qty 0.4

## 2020-05-27 MED ORDER — HEPARIN SODIUM (PORCINE) 1000 UNIT/ML IJ SOLN
INTRAMUSCULAR | Status: AC
  Filled 2020-05-27: qty 4

## 2020-05-27 MED ORDER — HEPARIN SODIUM (PORCINE) 1000 UNIT/ML IJ SOLN
INTRAMUSCULAR | Status: AC
  Filled 2020-05-27: qty 3

## 2020-05-27 NOTE — Progress Notes (Signed)
Inpatient Rehab Admissions Coordinator:   Pt continues to progress nicely with therapy, though still somewhat limited by orthostasis. Also note pt endorsing some ?drainage from sacral wound.  Will continue to follow.  Shann Medal, PT, DPT Admissions Coordinator [phone] 05/27/20  12:49 PM

## 2020-05-27 NOTE — H&P (Incomplete)
Physical Medicine and Rehabilitation Admission H&P    Chief Complaint  Patient presents with  . Shortness of Breath  : HPI: Fred [name] is a 50 year old right-handed male with history of hypertension, diabetes mellitus, obesity with BMI 38.37, end-stage renal disease with hemodialysis Tuesday Thursday Saturday at [facility] and reported medical noncompliance with hypertensive medications.  Patient with a long complicated history.  Prior to patient's initial hospital admission he lived with his 58 year old father in [name] independent prior to admission.  1 level home with ramped entrance.  Presented 04/29/2020 from [facility] skilled nursing facility where he had been for an extended time for events from COVID-19 testing + 04/20/2020 and issues related to his gallbladder and essentially was bedbound developing a sacral decubitus as well as bilateral heel wounds.  On admission patient with increasing shortness of breath as well as low-grade fever 100.9.  Sacral ulcer did have a foul-smelling odor.  Admission chemistries lactic acid 0.9, blood cultures no growth to date, BUN 11, creatinine 4.41, WBC 5800, hemoglobin 7.8, pro calcitonin 1.24.  Chest x-ray showed diffuse bilateral airspace disease correlated with COVID-19 status.  MRI of the left foot area of ulceration with nailbed irregularity at the first digit with findings suggestive of osteomyelitis involving the first digit distal tuft.  No loculated fluid collection or sinus tract and right foot MRI abnormal osseous edema head of the fifth metatarsal suspicious for osteomyelitis.  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  Surgery was consulted in regards to sacral and heel wounds and was receiving hydrotherapy.  Placed on vancomycin and cefepime to be ongoing through 06/10/2020.  Dr. Sharol Given was consulted in regards to unstageable pressure wounds of  left lateral foot lateral malleolus right lateral foot and heel dry gangrene of left fourth toe as well as left first metatarsal and right fifth meta tarsal advised Prevalon boots and would follow as outpatient.  Hemodialysis ongoing as per renal services monitoring closely for bouts of hypotension.  Subcutaneous heparin for DVT prophylaxis.  Due to patient's profound deconditioning multimedical issues he was admitted for a comprehensive rehabilitation program.  Review of Systems  Constitutional: Positive for fever and malaise/fatigue.  HENT: Negative for hearing loss.   Eyes: Negative for blurred vision and double vision.  Respiratory: Positive for cough, sputum production and shortness of breath.   Cardiovascular: Positive for leg swelling. Negative for chest pain and palpitations.  Gastrointestinal: Positive for constipation. Negative for heartburn, nausea and vomiting.  Genitourinary: Negative for dysuria, flank pain and hematuria.  Musculoskeletal: Positive for joint pain and myalgias.  Skin: Negative for rash.  All other systems reviewed and are negative.  Past Medical History:  Diagnosis Date  . Chronic kidney disease    HD MWF  . Diabetes mellitus without complication ([place])   . Hypertension    Past Surgical History:  Procedure Laterality Date  . CHOLECYSTECTOMY     History reviewed. No pertinent family history. Social History:  reports that he has never smoked. He has never used smokeless tobacco. He reports that he does not drink alcohol and does not use drugs. Allergies:  Allergies  Allergen Reactions  . Atorvastatin Hives    NOT on MAR   Medications Prior to Admission  Medication Sig Dispense Refill  . acetaminophen (TYLENOL) 325 MG tablet Take 650 mg by mouth every 6 (six) hours as needed for mild pain or fever (>101 F).    Marland Kitchen  amLODipine (NORVASC) 10 MG tablet Take 10 mg by mouth daily.    . benzonatate (TESSALON) 100 MG capsule Take 100 mg by mouth 3 (three) times  daily as needed for cough.    . Cholecalciferol 25 MCG (1000 UT) tablet Take 1,000 Units by mouth daily.    Marland Kitchen epoetin alfa-epbx (RETACRIT) 95638 UNIT/ML injection Inject 10,000 Units into the skin every Monday, Wednesday, and Friday.    . famotidine (PEPCID) 20 MG tablet Take 20 mg by mouth 2 (two) times daily.    . hydrALAZINE (APRESOLINE) 10 MG tablet Take 10 mg by mouth every 6 (six) hours as needed (SBP>180 OR DBP>100).    . insulin lispro (HUMALOG) 100 UNIT/ML injection Inject 5-15 Units into the skin 4 (four) times daily -  with meals and at bedtime. Per sliding scale 70-79= 0 units 180-200= 5 units 201-250= 8 units 251-300= 10 units 301-350= 12 units 351-400= 15 units Greater than 400 call MD    . iron polysaccharides (NIFEREX) 150 MG capsule Take 150 mg by mouth daily.    . Lactobacillus Rhamnosus, GG, (CULTURELLE PO) Take 1 capsule by mouth daily. 10 Billion cell capsule    . metoCLOPramide (REGLAN) 5 MG tablet Take 5 mg by mouth 3 (three) times daily before meals.    . metoprolol tartrate (LOPRESSOR) 50 MG tablet Take 50 mg by mouth 2 (two) times daily.    . multivitamin (RENA-VIT) TABS tablet Take 1 tablet by mouth daily.    Marland Kitchen oxyCODONE-acetaminophen (PERCOCET/ROXICET) 5-325 MG tablet Take 1 tablet by mouth every 6 (six) hours as needed for severe pain or moderate pain ((4-6) for up to 5 days).    . sodium chloride 1 g tablet Take 1 g by mouth in the morning and at bedtime.    . tamsulosin (FLOMAX) 0.4 MG CAPS capsule Take 0.4 mg by mouth daily.      Drug Regimen Review Drug regimen was reviewed and remains appropriate with no significant issues identified  Home: Home Living Family/patient expects to be discharged to:: Skilled nursing facility Living Arrangements: Group Home Additional Comments: [name]   Functional History: Prior Function Level of Independence: Needs assistance Gait / Transfers Assistance Needed: Pt reports he has not walked on his own in several  months but is working with PT to start walking in the parallel bars. ADL's / Homemaking Assistance Needed: Staff assisting with bathing, dressing. He is able to feed himself and brush his teeth per pt report Comments: from snf  Functional Status:  Mobility: Bed Mobility Overal bed mobility: Needs Assistance Bed Mobility: Rolling,Sidelying to Sit,Sit to Sidelying Rolling: Modified independent (Device/Increase time) Sidelying to sit: Min guard Supine to sit: Mod assist,+2 for physical assistance Sit to supine: Min assist Sit to sidelying: Supervision General bed mobility comments: Min A for trunk and advancement of torso to R side using footboard to gain balance. Max A for return to supine due to dropping BP Transfers Overall transfer level: Needs assistance Equipment used: Ambulation equipment used Transfer via Lift Equipment: Stedy Transfers: Sit to/from Stand Sit to Stand: Mod assist,+2 safety/equipment,+2 physical assistance  Lateral/Scoot Transfers: Total assist General transfer comment: see note for orthostatic vitals. Sit to stand x 3 with STEDY and modA+2 Ambulation/Gait Ambulation/Gait assistance:  (NT) General Gait Details: unable    ADL: ADL Overall ADL's : Needs assistance/impaired Eating/Feeding: Set up,Bed level Eating/Feeding Details (indicate cue type and reason): malfunction with bed settings, could not elevate bed for self feeding and unsafe to leave pt  sitting EOB to eat. Pt propped up on elbow for lunch. Grooming: Minimal assistance Upper Body Bathing: Moderate assistance,Bed level Lower Body Bathing: Maximal assistance,Bed level Upper Body Dressing : Minimal assistance,Sitting,Bed level Lower Body Dressing: Maximal assistance,Bed level Lower Body Dressing Details (indicate cue type and reason): Pt able to form figure four position in bed but could not bring feet close enough to don socks. decreased coordination/control of B LE Toilet Transfer: Maximal  assistance,+2 for physical assistance,+2 for safety/equipment Toilet Transfer Details (indicate cue type and reason): in stedy frame Toileting- Clothing Manipulation and Hygiene: Total assistance Toileting - Clothing Manipulation Details (indicate cue type and reason): Total assist for pericare in bed. Functional mobility during ADLs: Maximal assistance,+2 for physical assistance,+2 for safety/equipment (sit <> stand in stedy) General ADL Comments: Continues to be limited by decreased strength, dropping BP with activities  Cognition: Cognition Overall Cognitive Status: Within Functional Limits for tasks assessed Orientation Level: Oriented X4 Cognition Arousal/Alertness: Awake/alert Behavior During Therapy: WFL for tasks assessed/performed,Flat affect Overall Cognitive Status: Within Functional Limits for tasks assessed Area of Impairment: Awareness,Safety/judgement Orientation Level: Disoriented to,Place,Time,Situation Current Attention Level: Selective Memory: Decreased short-term memory Following Commands: Follows one step commands consistently,Follows multi-step commands inconsistently Safety/Judgement: Decreased awareness of safety,Decreased awareness of deficits Awareness: Emergent Problem Solving: Slow processing,Requires verbal cues General Comments: Pt pleasant and motivated to complete all tasks.  Physical Exam: Blood pressure 120/85, pulse 96, temperature 98.9 F (37.2 C), temperature source Oral, resp. rate 18, height 6\' 6"  (1.981 m), weight (!) 150.6 kg, SpO2 100 %. Physical Exam Skin:    Comments: Sacral decubitus as well as bilateral heel dressings are in place change routinely per wound care nurse  Neurological:     Comments: Patient is alert in no acute distress.  Complaints of feeling weak.  Oriented x3.  Follows simple commands.  [place] but really was not sure why he was in the nursing home in the first place     Results for orders placed or  performed during the hospital encounter of 04/29/20 (from the past 48 hour(s))  Glucose, capillary     Status: Abnormal   Collection Time: 05/25/20 12:00 PM  Result Value Ref Range   Glucose-Capillary 139 (H) 70 - 99 mg/dL    Comment: Glucose reference range applies only to samples taken after fasting for at least 8 hours.  Glucose, capillary     Status: Abnormal   Collection Time: 05/25/20  4:11 PM  Result Value Ref Range   Glucose-Capillary 144 (H) 70 - 99 mg/dL    Comment: Glucose reference range applies only to samples taken after fasting for at least 8 hours.  Glucose, capillary     Status: Abnormal   Collection Time: 05/25/20 10:09 PM  Result Value Ref Range   Glucose-Capillary 215 (H) 70 - 99 mg/dL    Comment: Glucose reference range applies only to samples taken after fasting for at least 8 hours.  CBC     Status: Abnormal   Collection Time: 05/26/20  1:49 AM  Result Value Ref Range   WBC 9.4 4.0 - 10.5 K/uL   RBC 3.31 (L) 4.22 - 5.81 MIL/uL   Hemoglobin 9.0 (L) 13.0 - 17.0 g/dL   HCT 29.0 (L) 39.0 - 52.0 %   MCV 87.6 80.0 - 100.0 fL   MCH 27.2 26.0 - 34.0 pg   MCHC 31.0 30.0 - 36.0 g/dL   RDW 18.7 (H) 11.5 - 15.5 %   Platelets 257 150 -  400 K/uL   nRBC 0.0 0.0 - 0.2 %    Comment: Performed at [facility], [place] [address], [place], [place] [address]  Basic metabolic panel     Status: Abnormal   Collection Time: 05/26/20  1:49 AM  Result Value Ref Range   Sodium 135 135 - 145 mmol/L   Potassium 4.2 3.5 - 5.1 mmol/L   Chloride 98 98 - 111 mmol/L   CO2 24 22 - 32 mmol/L   Glucose, Bld 228 (H) 70 - 99 mg/dL    Comment: Glucose reference range applies only to samples taken after fasting for at least 8 hours.   BUN 27 (H) 6 - 20 mg/dL   Creatinine, Ser 3.99 (H) 0.61 - 1.24 mg/dL   Calcium 8.6 (L) 8.9 - 10.3 mg/dL   GFR, Estimated 18 (L) >60 mL/min    Comment: (NOTE) Calculated using the CKD-EPI Creatinine Equation (2021)    Anion gap 13 5 - 15    Comment:  Performed at [facility] [address], [place], [place] [address]  Glucose, capillary     Status: Abnormal   Collection Time: 05/26/20  8:27 AM  Result Value Ref Range   Glucose-Capillary 146 (H) 70 - 99 mg/dL    Comment: Glucose reference range applies only to samples taken after fasting for at least 8 hours.  Glucose, capillary     Status: Abnormal   Collection Time: 05/26/20 11:36 AM  Result Value Ref Range   Glucose-Capillary 216 (H) 70 - 99 mg/dL    Comment: Glucose reference range applies only to samples taken after fasting for at least 8 hours.  Glucose, capillary     Status: Abnormal   Collection Time: 05/26/20  4:13 PM  Result Value Ref Range   Glucose-Capillary 317 (H) 70 - 99 mg/dL    Comment: Glucose reference range applies only to samples taken after fasting for at least 8 hours.  Glucose, capillary     Status: Abnormal   Collection Time: 05/26/20  9:37 PM  Result Value Ref Range   Glucose-Capillary 234 (H) 70 - 99 mg/dL    Comment: Glucose reference range applies only to samples taken after fasting for at least 8 hours.  Basic metabolic panel     Status: Abnormal   Collection Time: 05/27/20  1:52 AM  Result Value Ref Range   Sodium 134 (L) 135 - 145 mmol/L   Potassium 4.2 3.5 - 5.1 mmol/L   Chloride 98 98 - 111 mmol/L   CO2 25 22 - 32 mmol/L   Glucose, Bld 225 (H) 70 - 99 mg/dL    Comment: Glucose reference range applies only to samples taken after fasting for at least 8 hours.   BUN 39 (H) 6 - 20 mg/dL   Creatinine, Ser 5.81 (H) 0.61 - 1.24 mg/dL   Calcium 9.0 8.9 - 10.3 mg/dL   GFR, Estimated 11 (L) >60 mL/min    Comment: (NOTE) Calculated using the CKD-EPI Creatinine Equation (2021)    Anion gap 11 5 - 15    Comment: Performed at [facility] [address] [place], [place] [address]  CBC     Status: Abnormal   Collection Time: 05/27/20  1:52 AM  Result Value Ref Range   WBC 10.1 4.0 - 10.5 K/uL   RBC 3.38 (L) 4.22 - 5.81 MIL/uL    Hemoglobin 9.2 (L) 13.0 - 17.0 g/dL   HCT 29.3 (L) 39.0 - 52.0 %  MCV 86.7 80.0 - 100.0 fL   MCH 27.2 26.0 - 34.0 pg   MCHC 31.4 30.0 - 36.0 g/dL   RDW 18.4 (H) 11.5 - 15.5 %   Platelets 256 150 - 400 K/uL   nRBC 0.0 0.0 - 0.2 %    Comment: Performed at [facility] [address], [place], [place] [address]   No results found.     Medical Problem List and Plan: 1.  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  Antithrombotics: -DVT/anticoagulation: Subcutaneous heparin.  Check vascular study  -antiplatelet therapy: N/A 3. Pain Management: Oxycodone as needed 4. Mood: Provide emotional support  -antipsychotic agents: N/A 5. Neuropsych: This patient is capable of making decisions on his own behalf. 6. Skin/Wound Care: Routine skin checks 7. Fluids/Electrolytes/Nutrition: Routine in and outs with follow-up chemistries 8.  End-stage renal disease.  Hemodialysis per renal services 9.  Sacral decubitus ulcer as well as ischemic ulcers on the left foot fourth toe and fifth metatarsal head and right heel and fifth metatarsal head.  Wound care nurse follow-up.  Patient to see Dr. Sharol Given outpatient.  Continue bilateral PRAFO's. 10.  ID/ coccygeal osteomyelitis.  Continue vancomycin and cefepime through 06/10/2020 10.  Anemia of chronic disease.  Continue Aranesp 11.  Diabetes mellitus.  Hemoglobin A1c 5.6.  Lantus insulin 14 units nightly 12.  Morbid obesity.  BMI 38.37.  Dietary follow-up   Cathlyn Parsons, PA-C 06/06/2020

## 2020-05-27 NOTE — Progress Notes (Addendum)
Pharmacy Antibiotic Note- follow up   Randie Tallarico is a 50 y.o. male admitted on 04/29/2020 with coccygeal osteomyelitis.  Pharmacy consulted for vancomycin and cefepime dosing - ID planning to treat x 6 weeks through 06/10/20. Patient was previously on MWF HD and has been transitioned back to TTS.  Tolerating HD sessions.  Pre-HD vancomycin level was not drawn today; levels were previously therapeutic.  Afebrile, WBC WNL.  Plan: Vanc 1gm IV qHD TTS Cefepime 2 g IV qTTS Monitor HD tolerance, clinical progress, check pre-HD vanc level on Sat 1/8   Height: 6\' 6"  (198.1 cm) Weight: (!) (P) 157.9 kg (348 lb) IBW/kg (Calculated) : 91.4  Temp (24hrs), Avg:98.4 F (36.9 C), Min:98 F (36.7 C), Max:98.9 F (37.2 C)  Recent Labs  Lab 05/20/20 1329 05/21/20 0845 05/23/20 0743 05/24/20 0339 05/25/20 0309 05/26/20 0149 05/27/20 0152  WBC  --    < > 8.5 9.0 8.6 9.4 10.1  CREATININE  --    < > 3.28* 4.61* 5.67* 3.99* 5.81*  VANCORANDOM 23  --   --   --   --   --   --    < > = values in this interval not displayed.    Estimated Creatinine Clearance: 25 mL/min (A) (by C-G formula based on SCr of 5.81 mg/dL (H)).    Allergies  Allergen Reactions  . Atorvastatin Hives    NOT on MAR    Vanc 12/9 >> (1/20) Cefepime 12/9 >> (1/20)  Remdesivir 12/9 >>12/14  12/20 pre-HD VL: 18 mcg/mL on 1g qMWF >> no change 12/30 pre HD VL: 23 mcg/mL on 1g qTTS >> no change   12/9 BCx: negative  12/9 COVID: Positive  Renner Sebald D. Mina Marble, PharmD, BCPS, [place] 05/27/2020, 9:55 AM

## 2020-05-27 NOTE — Care Management Important Message (Signed)
Important Message  [name] MRN: [id number] Date of Birth: May 17, 1971   Medicare Important Message Given:  Yes     [name] P Almyra 05/27/2020, 11:38 AM

## 2020-05-27 NOTE — Progress Notes (Signed)
HD#28 Subjective:   No acute events overnight.  Patient evaluated during dialysis session. He has no new concerns at this time. Does endorse drainage from his sacral wound that he does not believe was present prior to the past few days. Denies fever chills.   Objective:  Vital signs in last 24 hours: Vitals:   05/25/20 2016 05/26/20 0838 05/26/20 1451 05/26/20 2026  BP: 129/75 113/86 129/90 120/85  Pulse: 88 88 88 96  Resp: 15 18 20 18   Temp: 98.4 F (36.9 C) 97.7 F (36.5 C) 98 F (36.7 C) 98.9 F (37.2 C)  TempSrc: Oral Oral Oral Oral  SpO2: 98% 97% 100% 100%  Weight:      Height:       Supplemental O2: Room Air SpO2: 100 % O2 Flow Rate (L/min): 25 L/min   Physical Exam:  Physical Exam Vitals and nursing note reviewed.  Constitutional:      General: He is not in acute distress.    Appearance: He is not ill-appearing, toxic-appearing or diaphoretic.  HENT:     Head: Normocephalic and atraumatic.  Cardiovascular:     Rate and Rhythm: Normal rate and regular rhythm.     Pulses: Normal pulses.     Heart sounds: Normal heart sounds. No murmur heard. No friction rub. No gallop.   Pulmonary:     Effort: Pulmonary effort is normal. No respiratory distress.     Breath sounds: No stridor. No wheezing, rhonchi or rales.  Abdominal:     General: Abdomen is flat. There is no distension.     Tenderness: There is no abdominal tenderness. There is no guarding or rebound.  Musculoskeletal:     Right lower leg: No edema.     Left lower leg: No edema.     Comments: Bilateral foot bandages in place.   Skin:    General: Skin is warm and dry.  Neurological:     General: No focal deficit present.     Mental Status: He is alert and oriented to person, place, and time. Mental status is at baseline.  Psychiatric:        Mood and Affect: Mood normal.        Behavior: Behavior normal.    Filed Weights   05/23/20 0500 05/25/20 0710 05/25/20 1112  Weight: (!) 159.7 kg (!)  154.2 kg (!) 150.6 kg     Intake/Output Summary (Last 24 hours) at 05/27/2020 0605 Last data filed at 05/26/2020 1900 Gross per 24 hour  Intake 720 ml  Output 200 ml  Net 520 ml   Net IO Since Admission: -23,203.45 mL [05/27/20 0605]  Pertinent Labs: CBC Latest Ref Rng & Units 05/27/2020 05/26/2020 05/25/2020  WBC 4.0 - 10.5 K/uL 10.1 9.4 8.6  Hemoglobin 13.0 - 17.0 g/dL 9.2(L) 9.0(L) 8.6(L)  Hematocrit 39.0 - 52.0 % 29.3(L) 29.0(L) 27.3(L)  Platelets 150 - 400 K/uL 256 257 269    CMP Latest Ref Rng & Units 05/27/2020 05/26/2020 05/25/2020  Glucose 70 - 99 mg/dL 225(H) 228(H) 256(H)  BUN 6 - 20 mg/dL 39(H) 27(H) 49(H)  Creatinine 0.61 - 1.24 mg/dL 5.81(H) 3.99(H) 5.67(H)  Sodium 135 - 145 mmol/L 134(L) 135 133(L)  Potassium 3.5 - 5.1 mmol/L 4.2 4.2 4.0  Chloride 98 - 111 mmol/L 98 98 98  CO2 22 - 32 mmol/L 25 24 25   Calcium 8.9 - 10.3 mg/dL 9.0 8.6(L) 8.9  Total Protein 6.5 - 8.1 g/dL - - -  Total Bilirubin 0.3 -  1.2 mg/dL - - -  Alkaline Phos 38 - 126 U/L - - -  AST 15 - 41 U/L - - -  ALT 0 - 44 U/L - - -    Imaging: No results found.  Assessment/Plan:   Principal Problem:   Sacral osteomyelitis (HCC) Active Problems:   Insulin dependent type 2 diabetes mellitus (HCC)   HTN (hypertension)   History of anemia due to chronic kidney disease   Pressure injury of both heels, unstageable ([place])   Pneumonia due to COVID-19 virus   ESRD (end stage renal disease) (HCC)   Avascular necrosis of first metatarsal, left foot (HCC)   Osteomyelitis of dorsal first metatarsal, left foot ([place])   Decubitus ulcer of sacral region, unstageable ([place])   Morbid obesity ([place])   Patient Summary: Mr. Helderman is 50yo male with ESRD, hypertension, insulin-depending type II diabetes mellitus admitted 04/29/20 for sacral decubitus ulcer and COVID-19 pneumonia, continuing to progress well on IV antibiotics, awaiting CIR bed placement.   #Unstageable sacral decubitus ulcer #Coccygeal  osteomyelitis Remains on long term antibiotics. Currently awaiting bed placement per CIR. Discussed patient's case with PT who suspect his hypotension is contributing and limiting his progress. Discussed with nephro, plan to pull less fluid today.  Patient endorses drainage from sacral ulcer, will reassess this afternoon once patient no longer in HD. If appears to be active infection, will plan to consult general surgery. Currently on abx coverage with vanc/cefepime, if new abscess present may need ESBL coverage.  - continue vanc/cefepime (end date 06/10/20) - Wound care per [facility] recs - pain management with tylenol q6h prn and percocet q4h prn - Air mattress, off-loading sacral wound - continue with PT/OT while waiting for CIR   #ESRD on HD Will resume TTS scheduling for HD. Nephrology following. Discussed with nephro APP concerning patient's hypotension. She noted will attempt to keep patient even or slightly positive in setting of his orthostatic hypotension.  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 Sharol Given for progression of dry gangrene of left 4th toe. Per patient, Dr. Sharol Given would like him to follow up on outpatient basis. - follow up with ortho outpatient - C/w antibiotics - C/w wound care, Prevalon boots  #Hx of Hypertension #Orthostatic Patient continues to be orthostatic when working with PT. Discontinued home amlodipine for now. Metoprolol DC'd yesterday.  - DC amlodipine and metoprolol. Restart once patient no longer hypotensive   #Deconditioning #Generalized weakness PT session yesterday, patient was found to be significantly orthostatic. Home amlodipine canceled. Will await further updates from case management and rehab coordinator.  - continue feeding supplements for nutrition - Continue to move patient  to chair twice daily - PT/OT - Pending CIR placement  Hx of Type II DM Patient has had elevated morning glucose readings over 200. Lantus increased to 14 u yesterday. Over the past few days the patient has elevated morning glucose readings. This does raise some concern for possible infeciton in setting of suspected new drainage from sacral wound.  - SSI with meals - nightly lantus increased to 14 units  Diet: Carb-Modified IVF: None,None VTE: Heparin Code: Full PT/OT recs: CIR  Dispo: Anticipated discharge to Skilled nursing facility in 1-2 days pending CIR bed availability.  Sanjuana Letters DO Internal Medicine Resident PGY-1 Pager [phone] Please contact the on call pager after 5 pm and  on weekends at [phone].

## 2020-05-27 NOTE — Plan of Care (Signed)
  Problem: Activity: Goal: Risk for activity intolerance will decrease Outcome: Progressing   Problem: Safety: Goal: Ability to remain free from injury will improve Outcome: Progressing   Problem: Skin Integrity: Goal: Risk for impaired skin integrity will decrease Outcome: Progressing   

## 2020-05-27 NOTE — Progress Notes (Signed)
Inpatient Diabetes Program Recommendations  AACE/ADA: New Consensus Statement on Inpatient Glycemic Control (2015)  Target Ranges:  Prepandial:   less than 140 mg/dL      Peak postprandial:   less than 180 mg/dL (1-2 hours)      Critically ill patients:  140 - 180 mg/dL   Lab Results  Component Value Date   GLUCAP 234 (H) 05/26/2020   HGBA1C 5.6 05/04/2020    Review of Glycemic Control Results for Fred [name], Fred [name] (MRN [id number]) as of 05/27/2020 13:23  Ref. Range 05/25/2020 22:09 05/26/2020 08:27 05/26/2020 11:36 05/26/2020 16:13 05/26/2020 21:37  Glucose-Capillary Latest Ref Range: 70 - 99 mg/dL 215 (H) 146 (H) 216 (H) 317 (H) 234 (H)   Inpatient Diabetes Program Recommendations:   -Consider Novolog 2 units tid meal coverage if eats 50% and adjust as needed.  Thank you, Nani Gasser. Jolanda Mccann, RN, MSN, CDE  Diabetes Coordinator Inpatient Glycemic Control Team Team Pager [phone] (8am-5pm) 05/27/2020 1:23 PM

## 2020-05-27 NOTE — Procedures (Signed)
I was present at this dialysis session. I have reviewed the session itself and made appropriate changes.   0.5L UF goal. TDC.  3K bath.    Filed Weights   05/23/20 0500 05/25/20 0710 05/25/20 1112  Weight: (!) 159.7 kg (!) 154.2 kg (!) 150.6 kg    Recent Labs  Lab 05/24/20 0339 05/25/20 0309 05/27/20 0152  NA 134*   < > 134*  K 3.9   < > 4.2  CL 97*   < > 98  CO2 25   < > 25  GLUCOSE 186*   < > 225*  BUN 30*   < > 39*  CREATININE 4.61*   < > 5.81*  CALCIUM 9.3   < > 9.0  PHOS 6.5*  --   --    < > = values in this interval not displayed.    Recent Labs  Lab 05/25/20 0309 05/26/20 0149 05/27/20 0152  WBC 8.6 9.4 10.1  HGB 8.6* 9.0* 9.2*  HCT 27.3* 29.0* 29.3*  MCV 86.4 87.6 86.7  PLT 269 257 256    Scheduled Meds: . (feeding supplement) PROSource Plus  30 mL Oral TID BM  . Chlorhexidine Gluconate Cloth  6 each Topical Q0600  . Chlorhexidine Gluconate Cloth  6 each Topical Q0600  . darbepoetin (ARANESP) injection - DIALYSIS  200 mcg Intravenous Q Thu-HD  . famotidine  20 mg Oral QHS  . feeding supplement (NEPRO CARB STEADY)  237 mL Oral BID BM  . heparin injection (subcutaneous)  5,000 Units Subcutaneous Q8H  . insulin aspart  0-5 Units Subcutaneous QHS  . insulin aspart  0-9 Units Subcutaneous TID WC  . insulin glargine  14 Units Subcutaneous QHS  . metoCLOPramide  5 mg Oral TID AC  . multivitamin  1 tablet Oral QHS  . polyethylene glycol  17 g Oral Daily  . senna  1 tablet Oral BID  . sevelamer carbonate  800 mg Oral TID WC  . sodium chloride flush  3 mL Intravenous Q12H   Continuous Infusions: . ceFEPime (MAXIPIME) IV 2 g (05/25/20 1828)  . vancomycin 1,000 mg (05/25/20 1009)   PRN Meds:.acetaminophen, ondansetron **OR** ondansetron (ZOFRAN) IV, oxyCODONE-acetaminophen   Pearson Grippe  MD 05/27/2020, 9:06 AM

## 2020-05-27 NOTE — Plan of Care (Signed)
  Problem: Education: Goal: Knowledge of risk factors and measures for prevention of condition will improve Outcome: Progressing   Problem: Coping: Goal: Psychosocial and spiritual needs will be supported Outcome: Progressing   Problem: Respiratory: Goal: Will maintain a patent airway Outcome: Progressing   

## 2020-05-28 LAB — GLUCOSE, CAPILLARY
Glucose-Capillary: 136 mg/dL — ABNORMAL HIGH (ref 70–99)
Glucose-Capillary: 109 mg/dL — ABNORMAL HIGH (ref 70–99)
Glucose-Capillary: 135 mg/dL — ABNORMAL HIGH (ref 70–99)
Glucose-Capillary: 170 mg/dL — ABNORMAL HIGH (ref 70–99)

## 2020-05-28 LAB — CBC
HCT: 30.3 % — ABNORMAL LOW (ref 39.0–52.0)
Hemoglobin: 9.5 g/dL — ABNORMAL LOW (ref 13.0–17.0)
MCH: 27.5 pg (ref 26.0–34.0)
MCHC: 31.4 g/dL (ref 30.0–36.0)
MCV: 87.8 fL (ref 80.0–100.0)
Platelets: 217 10*3/uL (ref 150–400)
RBC: 3.45 MIL/uL — ABNORMAL LOW (ref 4.22–5.81)
RDW: 18.6 % — ABNORMAL HIGH (ref 11.5–15.5)
WBC: 10 10*3/uL (ref 4.0–10.5)
nRBC: 0 % (ref 0.0–0.2)

## 2020-05-28 LAB — BASIC METABOLIC PANEL
Anion gap: 9 (ref 5–15)
BUN: 23 mg/dL — ABNORMAL HIGH (ref 6–20)
CO2: 26 mmol/L (ref 22–32)
Calcium: 9.1 mg/dL (ref 8.9–10.3)
Chloride: 100 mmol/L (ref 98–111)
Creatinine, Ser: 4.02 mg/dL — ABNORMAL HIGH (ref 0.61–1.24)
GFR, Estimated: 17 mL/min — ABNORMAL LOW (ref >60)
Glucose, Bld: 131 mg/dL — ABNORMAL HIGH (ref 70–99)
Potassium: 4.3 mmol/L (ref 3.5–5.1)
Sodium: 135 mmol/L (ref 135–145)

## 2020-05-28 NOTE — Progress Notes (Signed)
[facility] Progress Note   Subjective:  Seen in room. No complaints this am.  Completed dialysis yesterday with  minimal UF. Try to get up with PT today.    Objective Vitals:   05/27/20 1429 05/27/20 2100 05/28/20 0400 05/28/20 0812  BP: 121/84 (!) 146/90 (!) 135/96 (!) 134/97  Pulse: 90 94 92 91  Resp: 18 17 17 18   Temp: 97.9 F (36.6 C) 98.6 F (37 C) 99 F (37.2 C) 98.1 F (36.7 C)  TempSrc: Oral Oral Oral Oral  SpO2: 98% 100% 100% 98%  Weight:      Height:       Physical Exam General:Well developed man, NAD. Room air. Heart:RRR, no murmur Lungs:CTA bilaterally without wheezing, rhonchi or rales Abdomen:Soft, non-tender Extremities:No LE edema Dialysis Access:R IJ [facility]  Additional Objective Labs: Basic Metabolic Panel: Recent Labs  Lab 05/22/20 1144 05/23/20 0743 05/24/20 0339 05/25/20 0309 05/26/20 0149 05/27/20 0152 05/28/20 0546  NA 136 134* 134*   < > 135 134* 135  K 4.4 4.1 3.9   < > 4.2 4.2 4.3  CL 99 98 97*   < > 98 98 100  CO2 26 25 25    < > 24 25 26   GLUCOSE 99 207* 186*   < > 228* 225* 131*  BUN 26* 20 30*   < > 27* 39* 23*  CREATININE 4.57* 3.28* 4.61*   < > 3.99* 5.81* 4.02*  CALCIUM 8.8* 8.9 9.3   < > 8.6* 9.0 9.1  PHOS 4.6 4.6 6.5*  --   --   --   --    < > = values in this interval not displayed.   Liver Function Tests: Recent Labs  Lab 05/22/20 1144 05/23/20 0743 05/24/20 0339  ALBUMIN 1.7* 1.7* 1.7*   CBC: Recent Labs  Lab 05/24/20 0339 05/25/20 0309 05/26/20 0149 05/27/20 0152 05/28/20 0546  WBC 9.0 8.6 9.4 10.1 10.0  HGB 9.3* 8.6* 9.0* 9.2* 9.5*  HCT 30.7* 27.3* 29.0* 29.3* 30.3*  MCV 87.5 86.4 87.6 86.7 87.8  PLT 293 269 257 256 217   Medications: . ceFEPime (MAXIPIME) IV 2 g (05/27/20 1747)  . vancomycin 1,000 mg (05/27/20 1442)   . (feeding supplement) PROSource Plus  30 mL Oral TID BM  . Chlorhexidine Gluconate Cloth  6 each Topical Q0600  . Chlorhexidine Gluconate Cloth  6 each Topical  Q0600  . darbepoetin (ARANESP) injection - DIALYSIS  200 mcg Intravenous Q Thu-HD  . famotidine  20 mg Oral QHS  . feeding supplement (NEPRO CARB STEADY)  237 mL Oral BID BM  . heparin injection (subcutaneous)  5,000 Units Subcutaneous Q8H  . insulin aspart  0-5 Units Subcutaneous QHS  . insulin aspart  0-9 Units Subcutaneous TID WC  . insulin glargine  14 Units Subcutaneous QHS  . metoCLOPramide  5 mg Oral TID AC  . multivitamin  1 tablet Oral QHS  . polyethylene glycol  17 g Oral Daily  . senna  1 tablet Oral BID  . sevelamer carbonate  800 mg Oral TID WC  . sodium chloride flush  3 mL Intravenous Q12H    Dialysis Orders: NWTTS 4h 400/800 143.5kg 3K/2.5Ca TDCHeparin3000units IV TIW - Mircera157mcgIVq2wks - last 12/8 - Venofer 100mg IVqHD x10 - completed 6/10 doses  Assessment/Plan: 1. Sacral decub/coccygealosteo: On Vanc/Cefepime x 6 weeks (thru 06/10/20) - ID following.Continues hydrotherapy.Placement pending. 2. 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 3. COVID PNA- tested positive on 11/30, is  off isolation now. 4. ESRD: Continue HD per TTS schedule - next 1/8 5. BP/volume: No edemaor SOB, some issues with hypotension. Minimal UF on 1/6. Amlodipine and metoprolol d/c'd. BPs better.  Keep SBP > 100 on HD.  6. Anemiaof CKD- Hgb9.5,getting Aranespq Thursday - dose increased to 290mcg. 7. Secondary Hyperparathyroidism: CorrCaand Phos high.Not on VDRA.Using lowest available Ca bath with HD.Started Renvela 1/meals. 8. Nutrition: Alb very low, continue Nepro + pro-source supplements. 9. DM T2 - per admit   [facility] 05/28/2020,9:11 AM

## 2020-05-28 NOTE — Progress Notes (Signed)
Occupational Therapy Treatment Patient Details Name: Fred [name] MRN: [id number] DOB: 01-28-1971 Today's Date: 05/28/2020    History of present illness Pt is a 50 y.o. male admitted from [facility] on 04/29/20 for evaluation of low oxygen saturations 82% on room air; concern for sepsis form Covid PNA, sacral wound and foot wounds. 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 Of note, pt recently admitted to [facility] due to gallbladder problems then d/c to [facility](14 days) for rehab then readmitted to [facility] then d/c to [facility] for rehab. PMH includes ESRD on HD (started 9/21), HTN, DM, C. diff.   OT comments  This 50 yo male admitted with above presents to acute OT very willing to work, but his orthostasis is hindering progression. He will continue to benefit from acute OT with next session trying abdominal binder and/or SCD's to see if this will help.   Follow Up Recommendations  CIR;Supervision/Assistance - 24 hour    Equipment Recommendations  3 in 1 bedside commode;Wheelchair (measurements OT);Wheelchair cushion (measurements OT);Hospital bed       Precautions / Restrictions Precautions Precautions: Fall Precaution Comments: wounds on bilat feet and L sacrum, monitor orthostatic BPs Required Braces or Orthoses: Other Brace Other Brace: B prevalon boots Restrictions Weight Bearing Restrictions: No       Mobility Bed Mobility Overal bed mobility: Needs Assistance Bed Mobility: Rolling;Supine to Sit;Sit to Supine Rolling: Modified independent (Device/Increase time)   Supine to sit: Min guard Sit to supine: Min guard   General bed mobility comments: min guard for safety due to tendency to slide on mattress  Transfers Overall transfer level: Needs assistance Equipment used: Ambulation equipment used Transfers: Sit to/from Stand Sit to Stand: Mod assist;+2 safety/equipment;+2 physical assistance          General transfer comment: see note for orthostatic vitals. Sit to stand x 2 with STEDY and modA+2    Balance Overall balance assessment: Needs assistance Sitting-balance support: Feet supported;Single extremity supported Sitting balance-Leahy Scale: Fair Sitting balance - Comments: able to sit EOB with close supervision   Standing balance support: Bilateral upper extremity supported Standing balance-Leahy Scale: Poor Standing balance comment: reliant on UE support                           ADL either performed or assessed with clinical judgement   ADL Overall ADL's : Needs assistance/impaired     Grooming: Set up;Supervision/safety;Sitting;Wash/dry face;Oral care Grooming Details (indicate cue type and reason): EOB                                     Vision Patient Visual Report: No change from baseline            Cognition Arousal/Alertness: Awake/alert Behavior During Therapy: WFL for tasks assessed/performed;Flat affect Overall Cognitive Status: Within Functional Limits for tasks assessed                                          Exercises Exercises: Other exercises Other Exercises Other Exercises: seated marching and shoulder flexion to promote circulation and attempt to increase BP sitting EOB following standing attempt      General Comments At beginning of session, patient with exposed wound on L lateral malleolus, notified  RN. See note for orthostatic vitals. Patient symptomatic with standing, unable to obtain true standing BP due to need to sit because of severe dizziness and inability to keep eyes open in standing but upon sitting noted drop in >30 point drop in BP, encouraged conversation and eye opening in sitting and performing seated marching. Dizziness subsided in <2 minutes, but nausea remained. Attempted standing for second trial to perform pericare, patient became symptomatic and BP again not able to be  obtained in standing, however obtained upon sitting. Symptoms resolved following seated break, then returned to supine. Encouraged eating breakfast in chair position and sitting in chair position at times during the day to increase tolerance to upright.    Pertinent Vitals/ Pain       Pain Assessment: Faces Faces Pain Scale: No hurt         Frequency  Min 2X/week        Progress Toward Goals  OT Goals(current goals can now be found in the care plan section)  Progress towards OT goals: Not progressing toward goals - comment (due to orthostasis in standing)  Acute Rehab OT Goals Patient Stated Goal: to get better and not be dizzy OT Goal Formulation: With patient Time For Goal Achievement: 06/08/20 Potential to Achieve Goals: Good  Plan Discharge plan remains appropriate;Frequency needs to be updated    Co-evaluation    PT/OT/SLP Co-Evaluation/Treatment: Yes Reason for Co-Treatment: For patient/therapist safety;To address functional/ADL transfers PT goals addressed during session: Mobility/safety with mobility;Balance OT goals addressed during session: Strengthening/ROM;ADL's and self-care      AM-PAC OT "6 Clicks" Daily Activity     Outcome Measure   Help from another person eating meals?: None Help from another person taking care of personal grooming?: A Little (setup/S) Help from another person toileting, which includes using toliet, bedpan, or urinal?: Total Help from another person bathing (including washing, rinsing, drying)?: A Lot Help from another person to put on and taking off regular upper body clothing?: A Little Help from another person to put on and taking off regular lower body clothing?: A Lot 6 Click Score: 15    End of Session Equipment Utilized During Treatment: Gait belt;Other (comment) (sara stedy)  OT Visit Diagnosis: Other abnormalities of gait and mobility (R26.89);Muscle weakness (generalized) (M62.81);Other symptoms and signs involving  cognitive function;Dizziness and giddiness (R42)   Activity Tolerance Other (comment) (treatment limited when up on his feet due to orthostasis)   Patient Left in bed;with call bell/phone within reach   Nurse Communication Mobility status        Time: 1019-1101 OT Time Calculation (min): 42 min  Charges: OT Treatments $Self Care/Home Management : 8-22 mins  Fred [name], OTR/L Acute [employer] Pager [phone] Office [phone]     Fred [name] 05/28/2020, 2:57 PM

## 2020-05-28 NOTE — Plan of Care (Signed)
  Problem: Education: Goal: Knowledge of risk factors and measures for prevention of condition will improve Outcome: Progressing   Problem: Coping: Goal: Psychosocial and spiritual needs will be supported Outcome: Progressing   Problem: Respiratory: Goal: Will maintain a patent airway Outcome: Progressing   

## 2020-05-28 NOTE — Progress Notes (Addendum)
Physical Therapy Treatment Patient Details Name: Fred [name] MRN: [id number] DOB: 11-19-1970 Today's Date: 05/28/2020    History of Present Illness Pt is a 50 y.o. male admitted from [facility] on 04/29/20 for evaluation of low oxygen saturations 82% on room air; concern for sepsis form Covid PNA, sacral wound and foot wounds. 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 Of note, pt recently admitted to [facility] due to gallbladder problems then d/c to [facility](14 days) for rehab then readmitted to [facility] then d/c to [facility] for rehab. PMH includes ESRD on HD (started 9/21), HTN, DM, C. diff.    PT Comments    Patient continues to be limited by orthostasis (see below). Patient with increased symptoms this session with transitions, encouraged conversation and eye opening upon sitting. Instructed patient on seated marching to assist with increasing BP. Patient attempted standing x 2 with modA+2 and use of STEDY this session with symptoms of severe dizziness and "my jaw feels like blood is rushing" with standing < 30 seconds. Patient also presents with deficits in strength, endurance, and balance. Continue to recommend comprehensive inpatient rehab (CIR) for post-acute therapy needs.  Orthostatic BPs  Supine 153/94 HR 91  Sitting 151/121 HR 106  Sitting after 3 min 131/90 HR 97  Standing - unable to obtain true standing due to request to sit with complaints of severe dizziness 99/59 HR 90  Sitting after 1 min 111/77 HR 108  Sitting after 3 min 110/87 HR 109  Standing - unable to obtain true standing due to complaints of severe dizziness 91/62 HR 118  Sitting  95/78 HR 109     Follow Up Recommendations  CIR;Supervision for mobility/OOB     Equipment Recommendations  Wheelchair (measurements PT);Wheelchair cushion (measurements PT);Hospital bed;Other (comment) (hoyer lift)    Recommendations for Other Services        Precautions / Restrictions Precautions Precautions: Fall Precaution Comments: wounds on bilat feet and L sacrum, monitor orthostatic BPs Required Braces or Orthoses: Other Brace Other Brace: B prevalon boots Restrictions Weight Bearing Restrictions: No    Mobility  Bed Mobility Overal bed mobility: Needs Assistance Bed Mobility: Rolling;Supine to Sit;Sit to Supine Rolling: Modified independent (Device/Increase time)   Supine to sit: Min guard Sit to supine: Min guard   General bed mobility comments: min guard for safety due to tendency to slide on mattress  Transfers Overall transfer level: Needs assistance Equipment used: Ambulation equipment used Transfers: Sit to/from Stand Sit to Stand: Mod assist;+2 safety/equipment;+2 physical assistance         General transfer comment: see note for orthostatic vitals. Sit to stand x 2 with STEDY and modA+2  Ambulation/Gait                 Stairs             Wheelchair Mobility    Modified Rankin (Stroke Patients Only)       Balance Overall balance assessment: Needs assistance Sitting-balance support: Feet supported;Single extremity supported Sitting balance-Leahy Scale: Fair Sitting balance - Comments: able to sit EOB with close supervision   Standing balance support: Bilateral upper extremity supported Standing balance-Leahy Scale: Poor Standing balance comment: reliant on UE support                            Cognition Arousal/Alertness: Awake/alert Behavior During Therapy: WFL for tasks assessed/performed;Flat affect Overall Cognitive Status: Within Functional Limits  for tasks assessed                                        Exercises Other Exercises Other Exercises: seated marching and shoulder flexion to promote circulation and attempt to increase BP sitting EOB following standing attempt    General Comments General comments (skin integrity, edema, etc.): At  beginning of session, patient with exposed wound on L lateral malleolus, notified RN. See note for orthostatic vitals. Patient symptomatic with standing, unable to obtain true standing BP due to need to sit because of severe dizziness and inability to keep eyes open in standing but upon sitting noted drop in >30 point drop in BP, encouraged conversation and eye opening in sitting and performing seated marching. Dizziness subsided in <2 minutes, but nausea remained. Attempted standing for second trial to perform pericare, patient became symptomatic and BP again not able to be obtained in standing, however obtained upon sitting. Symptoms resolved following seated break, then returned to supine. Encouraged eating breakfast in chair position and sitting in chair position at times during the day to increase tolerance to upright.      Pertinent Vitals/Pain Pain Assessment: Faces Faces Pain Scale: No hurt    Home Living                      Prior Function            PT Goals (current goals can now be found in the care plan section) Acute Rehab PT Goals Patient Stated Goal: to get better and not be dizzy PT Goal Formulation: With patient Time For Goal Achievement: 06/09/20 Potential to Achieve Goals: Fair Progress towards PT goals: Progressing toward goals    Frequency    Min 3X/week      PT Plan Current plan remains appropriate    Co-evaluation PT/OT/SLP Co-Evaluation/Treatment: Yes Reason for Co-Treatment: For patient/therapist safety;To address functional/ADL transfers PT goals addressed during session: Mobility/safety with mobility;Balance        AM-PAC PT "6 Clicks" Mobility   Outcome Measure  Help needed turning from your back to your side while in a flat bed without using bedrails?: A Little Help needed moving from lying on your back to sitting on the side of a flat bed without using bedrails?: A Little Help needed moving to and from a bed to a chair (including a  wheelchair)?: Total Help needed standing up from a chair using your arms (e.g., wheelchair or bedside chair)?: A Lot Help needed to walk in hospital room?: Total Help needed climbing 3-5 steps with a railing? : Total 6 Click Score: 11    End of Session Equipment Utilized During Treatment: Gait belt Activity Tolerance: Patient tolerated treatment well Patient left: in bed;with call bell/phone within reach;with bed alarm set (patient in chair position) Nurse Communication: Mobility status PT Visit Diagnosis: Muscle weakness (generalized) (M62.81);Difficulty in walking, not elsewhere classified (R26.2)     Time: 4268-3419 PT Time Calculation (min) (ACUTE ONLY): 43 min  Charges:  $Therapeutic Activity: 23-37 mins                     Fred [name] A. Gilford Rile PT, DPT Acute Rehabilitation Services Pager [phone] Office [phone]   Alda Lea 05/28/2020, 11:26 AM

## 2020-05-28 NOTE — Progress Notes (Signed)
HD#29 Subjective:   No acute events overnight.  Evaluated at bedside during rounds. Patient states he feels less dizzy and lightheaded, has been able to last longer with exercises. Reports his sacral wound dressings were last changed yesterday morning. He is unsure if they are draining but reports some nurses have commented that there is ongoing drainage. States he is eating well.  Notes he speaks with his father and sister frequently to update them on his care and progress.   Objective:  Vital signs in last 24 hours: Vitals:   05/27/20 1429 05/27/20 2100 05/28/20 0400 05/28/20 0812  BP: 121/84 (!) 146/90 (!) 135/96 (!) 134/97  Pulse: 90 94 92 91  Resp: 18 17 17 18   Temp: 97.9 F (36.6 C) 98.6 F (37 C) 99 F (37.2 C) 98.1 F (36.7 C)  TempSrc: Oral Oral Oral Oral  SpO2: 98% 100% 100% 98%  Weight:      Height:       Supplemental O2: Room Air SpO2: 98 % O2 Flow Rate (L/min): 25 L/min   Physical Exam:  Physical Exam Vitals and nursing note reviewed.  Constitutional:      General: He is not in acute distress.    Appearance: He is not ill-appearing, toxic-appearing or diaphoretic.  HENT:     Head: Normocephalic and atraumatic.  Cardiovascular:     Rate and Rhythm: Normal rate and regular rhythm.     Pulses: Normal pulses.     Heart sounds: Normal heart sounds. No murmur heard. No friction rub. No gallop.   Pulmonary:     Effort: Pulmonary effort is normal. No respiratory distress.     Breath sounds: No stridor. No wheezing, rhonchi or rales.  Abdominal:     General: Abdomen is flat. There is no distension.     Tenderness: There is no abdominal tenderness. There is no guarding or rebound.  Musculoskeletal:     Right lower leg: No edema.     Left lower leg: No edema.     Comments: Bilateral foot bandages in place.   Skin:    General: Skin is warm and dry.     Comments: Sacral wound with granulation tissue, no active drainage present. Clean, Pink  Neurological:      General: No focal deficit present.     Mental Status: He is alert and oriented to person, place, and time. Mental status is at baseline.  Psychiatric:        Mood and Affect: Mood normal.        Behavior: Behavior normal.     Filed Weights   05/25/20 0710 05/25/20 1112 05/27/20 0858  Weight: (!) 154.2 kg (!) 150.6 kg (!) 157.9 kg     Intake/Output Summary (Last 24 hours) at 05/28/2020 1132 Last data filed at 05/27/2020 1900 Gross per 24 hour  Intake 240 ml  Output 500 ml  Net -260 ml   Net IO Since Admission: -23,463.45 mL [05/28/20 1132]  Pertinent Labs: CBC Latest Ref Rng & Units 05/28/2020 05/27/2020 05/26/2020  WBC 4.0 - 10.5 K/uL 10.0 10.1 9.4  Hemoglobin 13.0 - 17.0 g/dL 9.5(L) 9.2(L) 9.0(L)  Hematocrit 39.0 - 52.0 % 30.3(L) 29.3(L) 29.0(L)  Platelets 150 - 400 K/uL 217 256 257    CMP Latest Ref Rng & Units 05/28/2020 05/27/2020 05/26/2020  Glucose 70 - 99 mg/dL 131(H) 225(H) 228(H)  BUN 6 - 20 mg/dL 23(H) 39(H) 27(H)  Creatinine 0.61 - 1.24 mg/dL 4.02(H) 5.81(H) 3.99(H)  Sodium 135 -  145 mmol/L 135 134(L) 135  Potassium 3.5 - 5.1 mmol/L 4.3 4.2 4.2  Chloride 98 - 111 mmol/L 100 98 98  CO2 22 - 32 mmol/L 26 25 24   Calcium 8.9 - 10.3 mg/dL 9.1 9.0 8.6(L)  Total Protein 6.5 - 8.1 g/dL - - -  Total Bilirubin 0.3 - 1.2 mg/dL - - -  Alkaline Phos 38 - 126 U/L - - -  AST 15 - 41 U/L - - -  ALT 0 - 44 U/L - - -    Imaging: No results found.  Assessment/Plan:   Principal Problem:   Sacral osteomyelitis (HCC) Active Problems:   Insulin dependent type 2 diabetes mellitus (HCC)   HTN (hypertension)   History of anemia due to chronic kidney disease   Pressure injury of both heels, unstageable ([place])   Pneumonia due to COVID-19 virus   ESRD (end stage renal disease) (HCC)   Avascular necrosis of first metatarsal, left foot (HCC)   Osteomyelitis of dorsal first metatarsal, left foot ([place])   Decubitus ulcer of sacral region, unstageable ([place])   Morbid obesity  ([place])  Patient Summary: Mr. Pagliarulo is 50yo male with ESRD, hypertension, insulin-depending type II diabetes mellitus admitted 04/29/20 for sacral decubitus ulcer and COVID-19 pneumonia, continuing to progress well on IV antibiotics, awaiting CIR bed placement.   #Unstageable sacral decubitus ulcer #Coccygeal osteomyelitis Remains on long term antibiotics. Currently awaiting bed placement per CIR. Discussed patient's case with PT who suspect his hypotension is contributing and limiting his progress. Patient endorses drainage from sacral ulcer, no active drainage upon evaluation. Pink, moist tissue. Packing in place. Do not believe patient requires general surgery evaluation or additional abx at this time.  - continue vanc/cefepime (end date 06/10/20) - Wound care per [facility] recs - pain management with tylenol q6h prn and percocet q4h prn - Air mattress, off-loading sacral wound - continue with PT/OT while waiting for CIR   #ESRD on HD Will resume TTS scheduling for HD. Nephrology following. Discussed with nephro APP concerning patient's hypotension. She noted will attempt to keep patient even or slightly positive in setting of his orthostatic hypotension.  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 Sharol Given for progression of dry gangrene of left 4th toe. Per patient, Dr. Sharol Given would like him to follow up on outpatient basis. - follow up with ortho outpatient - C/w antibiotics - C/w wound care, Prevalon boots  #Hx of Hypertension #Orthostatic Patient continues to be orthostatic when working with PT. Discontinued home anti-hypertensives. Will add abdominal belt and SCD's for PT sessions to help with orthostasis.  - DC amlodipine and metoprolol. Restart once patient no longer hypotensive  -Abdominal belt, SCD during PT  sessions  #Deconditioning #Generalized weakness PT continuing to work with patient. Able to tolerate longer sessions of therapy. - continue feeding supplements for nutrition - Continue to move patient to chair twice daily - PT/OT - Pending CIR placement  Hx of Type II DM Glucose readings have improved over the last 24 hours. Currently receiving 14 U lantus in the evening.  - SSI with meals - nightly lantus 14 U  Diet: Carb-Modified IVF: None,None VTE: Heparin Code: Full PT/OT recs: CIR  Dispo: Anticipated discharge to Skilled nursing facility pending CIR bed availability.  [place] Internal Medicine Resident PGY-1 Pager [phone] Please contact the on  call pager after 5 pm and on weekends at [phone].

## 2020-05-29 LAB — CBC
HCT: 31.6 % — ABNORMAL LOW (ref 39.0–52.0)
Hemoglobin: 9.4 g/dL — ABNORMAL LOW (ref 13.0–17.0)
MCH: 26.3 pg (ref 26.0–34.0)
MCHC: 29.7 g/dL — ABNORMAL LOW (ref 30.0–36.0)
MCV: 88.5 fL (ref 80.0–100.0)
Platelets: 196 10*3/uL (ref 150–400)
RBC: 3.57 MIL/uL — ABNORMAL LOW (ref 4.22–5.81)
RDW: 18.1 % — ABNORMAL HIGH (ref 11.5–15.5)
WBC: 9.5 10*3/uL (ref 4.0–10.5)
nRBC: 0 % (ref 0.0–0.2)

## 2020-05-29 LAB — HEPATITIS B SURFACE ANTIBODY,QUALITATIVE: Hep B S Ab: NONREACTIVE

## 2020-05-29 LAB — BASIC METABOLIC PANEL
Anion gap: 11 (ref 5–15)
BUN: 38 mg/dL — ABNORMAL HIGH (ref 6–20)
CO2: 24 mmol/L (ref 22–32)
Calcium: 9.2 mg/dL (ref 8.9–10.3)
Chloride: 99 mmol/L (ref 98–111)
Creatinine, Ser: 5.4 mg/dL — ABNORMAL HIGH (ref 0.61–1.24)
GFR, Estimated: 12 mL/min — ABNORMAL LOW (ref >60)
Glucose, Bld: 190 mg/dL — ABNORMAL HIGH (ref 70–99)
Potassium: 4.6 mmol/L (ref 3.5–5.1)
Sodium: 134 mmol/L — ABNORMAL LOW (ref 135–145)

## 2020-05-29 LAB — GLUCOSE, CAPILLARY
Glucose-Capillary: 169 mg/dL — ABNORMAL HIGH (ref 70–99)
Glucose-Capillary: 196 mg/dL — ABNORMAL HIGH (ref 70–99)
Glucose-Capillary: 191 mg/dL — ABNORMAL HIGH (ref 70–99)

## 2020-05-29 LAB — HEPATITIS B SURFACE ANTIGEN: Hepatitis B Surface Ag: NONREACTIVE

## 2020-05-29 LAB — VANCOMYCIN, RANDOM: Vancomycin Rm: 31

## 2020-05-29 LAB — VANCOMYCIN, TROUGH: Vancomycin Tr: 31 ug/mL (ref 15–20)

## 2020-05-29 LAB — HEPATITIS B CORE ANTIBODY, TOTAL: Hep B Core Total Ab: NONREACTIVE

## 2020-05-29 MED ORDER — VANCOMYCIN HCL IN DEXTROSE 1-5 GM/200ML-% IV SOLN
INTRAVENOUS | Status: AC
  Administered 2020-05-29: 1000 mg
  Filled 2020-05-29: qty 200

## 2020-05-29 MED ORDER — HEPARIN SODIUM (PORCINE) 1000 UNIT/ML DIALYSIS
1000.0000 [IU] | INTRAMUSCULAR | Status: DC | PRN
  Administered 2020-06-01: 1000 [IU] via INTRAVENOUS_CENTRAL
  Filled 2020-05-29 (×2): qty 1

## 2020-05-29 MED ORDER — LIDOCAINE-PRILOCAINE 2.5-2.5 % EX CREA
1.0000 "application " | TOPICAL_CREAM | CUTANEOUS | Status: DC | PRN
  Filled 2020-05-29: qty 5

## 2020-05-29 MED ORDER — HEPARIN SODIUM (PORCINE) 1000 UNIT/ML IJ SOLN
INTRAMUSCULAR | Status: AC
  Filled 2020-05-29: qty 4

## 2020-05-29 MED ORDER — SODIUM CHLORIDE 0.9 % IV SOLN: 100.0000 mL | INTRAVENOUS | Status: DC | PRN

## 2020-05-29 MED ORDER — HEPARIN SODIUM (PORCINE) 1000 UNIT/ML DIALYSIS: 20.0000 [IU]/kg | INTRAMUSCULAR | Status: DC | PRN

## 2020-05-29 MED ORDER — ALTEPLASE 2 MG IJ SOLR: 2.0000 mg | Freq: Once | INTRAMUSCULAR | Status: DC | PRN

## 2020-05-29 MED ORDER — LIDOCAINE HCL (PF) 1 % IJ SOLN
5.0000 mL | INTRAMUSCULAR | Status: DC | PRN
  Filled 2020-05-29: qty 5

## 2020-05-29 MED ORDER — PENTAFLUOROPROP-TETRAFLUOROETH EX AERO: 1.0000 "application " | INHALATION_SPRAY | CUTANEOUS | Status: DC | PRN

## 2020-05-29 MED ORDER — HEPARIN SODIUM (PORCINE) 1000 UNIT/ML DIALYSIS
20.0000 [IU]/kg | INTRAMUSCULAR | Status: DC | PRN
  Administered 2020-05-29 – 2020-06-01 (×2): 3000 [IU] via INTRAVENOUS_CENTRAL
  Filled 2020-05-29 (×3): qty 3

## 2020-05-29 NOTE — Progress Notes (Signed)
HD#30 Subjective:   No acute events overnight.  Evaluated at bedside during rounds. Patient states that he is doing well today. He has not yet used the abdominal binder or SCDs yet as he has not worked with PT/OT yet, but is looking forward to his next section.   Objective:  Vital signs in last 24 hours: Vitals:   05/28/20 0812 05/28/20 1440 05/28/20 2016 05/29/20 0513  BP: (!) 134/97 (!) 147/97 128/84 (!) 141/90  Pulse: 91 97 92 84  Resp: 18 20 18 16   Temp: 98.1 F (36.7 C) 98 F (36.7 C) 98.7 F (37.1 C) 98.7 F (37.1 C)  TempSrc: Oral Oral Oral Oral  SpO2: 98% 98% 100% 100%  Weight:      Height:       Physical Exam Constitutional:      General: He is not in acute distress.    Appearance: He is not toxic-appearing.  Cardiovascular:     Rate and Rhythm: Normal rate and regular rhythm.     Heart sounds: No murmur heard. No gallop.   Pulmonary:     Effort: Pulmonary effort is normal. No accessory muscle usage or respiratory distress.     Breath sounds: Normal breath sounds.  Abdominal:     General: Bowel sounds are normal.     Palpations: Abdomen is soft. There is no mass.     Tenderness: There is no abdominal tenderness. There is no guarding.  Neurological:     Mental Status: He is alert and oriented to person, place, and time.     Supplemental O2: Room Air SpO2: 100 % O2 Flow Rate (L/min): 25 L/min   Physical Exam:    Filed Weights   05/25/20 0710 05/25/20 1112 05/27/20 0858  Weight: (!) 154.2 kg (!) 150.6 kg (!) 157.9 kg     Intake/Output Summary (Last 24 hours) at 05/29/2020 0709 Last data filed at 05/29/2020 0500 Gross per 24 hour  Intake 1440 ml  Output --  Net 1440 ml   Net IO Since Admission: -22,023.45 mL [05/29/20 0709]  Pertinent Labs: CBC Latest Ref Rng & Units 05/29/2020 05/28/2020 05/27/2020  WBC 4.0 - 10.5 K/uL 9.5 10.0 10.1  Hemoglobin 13.0 - 17.0 g/dL 9.4(L) 9.5(L) 9.2(L)  Hematocrit 39.0 - 52.0 % 31.6(L) 30.3(L) 29.3(L)  Platelets  150 - 400 K/uL 196 217 256    CMP Latest Ref Rng & Units 05/29/2020 05/28/2020 05/27/2020  Glucose 70 - 99 mg/dL 190(H) 131(H) 225(H)  BUN 6 - 20 mg/dL 38(H) 23(H) 39(H)  Creatinine 0.61 - 1.24 mg/dL 5.40(H) 4.02(H) 5.81(H)  Sodium 135 - 145 mmol/L 134(L) 135 134(L)  Potassium 3.5 - 5.1 mmol/L 4.6 4.3 4.2  Chloride 98 - 111 mmol/L 99 100 98  CO2 22 - 32 mmol/L 24 26 25   Calcium 8.9 - 10.3 mg/dL 9.2 9.1 9.0  Total Protein 6.5 - 8.1 g/dL - - -  Total Bilirubin 0.3 - 1.2 mg/dL - - -  Alkaline Phos 38 - 126 U/L - - -  AST 15 - 41 U/L - - -  ALT 0 - 44 U/L - - -    Imaging: No results found.  Assessment/Plan:   Principal Problem:   Sacral osteomyelitis (HCC) Active Problems:   Insulin dependent type 2 diabetes mellitus (HCC)   HTN (hypertension)   History of anemia due to chronic kidney disease   Pressure injury of both heels, unstageable ([place])   Pneumonia due to COVID-19 virus   ESRD (end  stage renal disease) ([place])   Avascular necrosis of first metatarsal, left foot (HCC)   Osteomyelitis of dorsal first metatarsal, left foot (HCC)   Decubitus ulcer of sacral region, unstageable ([place])   Morbid obesity ([place])  Patient Summary: Fred [name] is 50yo male with ESRD, hypertension, insulin-depending type II diabetes mellitus admitted 04/29/20 for sacral decubitus ulcer and COVID-19 pneumonia, continuing to progress well on IV antibiotics, awaiting CIR bed placement.   #Unstageable sacral decubitus ulcer #Coccygeal osteomyelitis Remains on long term antibiotics. Currently awaiting bed placement per CIR. Discussed patient's case with PT who suspect his hypotension is contributing and limiting his progress. Patient endorses drainage from sacral ulcer, no active drainage upon evaluation. Pink, moist tissue. Packing in place. Do not believe patient requires general surgery evaluation or additional abx at this time.  - continue vanc/cefepime (end date 06/10/20) - Wound care per [facility] recs - pain  management with tylenol q6h prn and percocet q4h prn - Air mattress, off-loading sacral wound - continue with PT/OT while waiting for CIR   #ESRD on HD Will resume TTS scheduling for HD. Nephrology following. Discussed with nephro APP concerning patient's hypotension. She noted will attempt to keep patient even or slightly positive in setting of his orthostatic hypotension.  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 Sharol Given for progression of dry gangrene of left 4th toe. Per patient, Dr. Sharol Given would like him to follow up on outpatient basis. - follow up with ortho outpatient - C/w antibiotics - C/w wound care, Prevalon boots  #Hx of Hypertension #Orthostatic Patient continues to be orthostatic when working with PT. Discontinued home anti-hypertensives.  - abdominal binder and scds with PT sessions - Continue to hold amlodipine and metoprolol. Restart once patient no longer hypotensive  -Abdominal belt, SCD during PT sessions  #Deconditioning #Generalized weakness PT continuing to work with patient. Able to tolerate longer sessions of therapy. - Continue feeding supplements for nutrition - Continue to move patient to chair twice daily - PT/OT - Pending CIR placement  Hx of Type II DM Glucose readings have improved over the last 24 hours. Currently receiving 14 U lantus in the evening.  - SSI with meals - nightly lantus 14 U  Diet: Carb-Modified IVF: None,None VTE: Heparin Code: Full PT/OT recs: CIR  Dispo: Anticipated discharge to Skilled nursing facility pending CIR bed availability.  Maudie Mercury, MD Internal Medicine Resident PGY-2 Pager [phone] Please contact the on call pager after 5 pm and on weekends at [phone].

## 2020-05-29 NOTE — Procedures (Signed)
I was present at this dialysis session. I have reviewed the session itself and made appropriate changes.   No interval events. 2K bath. Goal UF 1.5L.    Filed Weights   05/25/20 0710 05/25/20 1112 05/27/20 0858  Weight: (!) 154.2 kg (!) 150.6 kg (!) 157.9 kg    Recent Labs  Lab 05/24/20 0339 05/25/20 0309 05/29/20 0213  NA 134*   < > 134*  K 3.9   < > 4.6  CL 97*   < > 99  CO2 25   < > 24  GLUCOSE 186*   < > 190*  BUN 30*   < > 38*  CREATININE 4.61*   < > 5.40*  CALCIUM 9.3   < > 9.2  PHOS 6.5*  --   --    < > = values in this interval not displayed.    Recent Labs  Lab 05/27/20 0152 05/28/20 0546 05/29/20 0213  WBC 10.1 10.0 9.5  HGB 9.2* 9.5* 9.4*  HCT 29.3* 30.3* 31.6*  MCV 86.7 87.8 88.5  PLT 256 217 196    Scheduled Meds: . (feeding supplement) PROSource Plus  30 mL Oral TID BM  . Chlorhexidine Gluconate Cloth  6 each Topical Q0600  . Chlorhexidine Gluconate Cloth  6 each Topical Q0600  . darbepoetin (ARANESP) injection - DIALYSIS  200 mcg Intravenous Q Thu-HD  . famotidine  20 mg Oral QHS  . feeding supplement (NEPRO CARB STEADY)  237 mL Oral BID BM  . heparin injection (subcutaneous)  5,000 Units Subcutaneous Q8H  . insulin aspart  0-5 Units Subcutaneous QHS  . insulin aspart  0-9 Units Subcutaneous TID WC  . insulin glargine  14 Units Subcutaneous QHS  . metoCLOPramide  5 mg Oral TID AC  . multivitamin  1 tablet Oral QHS  . polyethylene glycol  17 g Oral Daily  . senna  1 tablet Oral BID  . sevelamer carbonate  800 mg Oral TID WC  . sodium chloride flush  3 mL Intravenous Q12H   Continuous Infusions: . [START ON 05/30/2020] sodium chloride    . [START ON 05/30/2020] sodium chloride    . ceFEPime (MAXIPIME) IV 2 g (05/27/20 1747)  . vancomycin 1,000 mg (05/27/20 1442)   PRN Meds:.[START ON 05/30/2020] sodium chloride, [START ON 05/30/2020] sodium chloride, acetaminophen, [START ON 05/30/2020] alteplase, [START ON 05/30/2020] heparin, heparin, [START ON  05/30/2020] lidocaine (PF), [START ON 05/30/2020] lidocaine-prilocaine, ondansetron **OR** ondansetron (ZOFRAN) IV, oxyCODONE-acetaminophen, [START ON 05/30/2020] pentafluoroprop-tetrafluoroeth   Fred Grippe  MD 05/29/2020, 9:35 AM

## 2020-05-29 NOTE — Plan of Care (Signed)
  Problem: Activity: Goal: Risk for activity intolerance will decrease Outcome: Progressing   Problem: Safety: Goal: Ability to remain free from injury will improve Outcome: Progressing   Problem: Skin Integrity: Goal: Risk for impaired skin integrity will decrease Outcome: Progressing   Problem: Coping: Goal: Level of anxiety will decrease Outcome: Progressing

## 2020-05-29 NOTE — Progress Notes (Signed)
Notified pharmacy vancomycin level.

## 2020-05-29 NOTE — Plan of Care (Signed)

## 2020-05-30 DIAGNOSIS — S91031A Puncture wound without foreign body, right ankle, initial encounter: Secondary | ICD-10-CM

## 2020-05-30 LAB — GLUCOSE, CAPILLARY
Glucose-Capillary: 106 mg/dL — ABNORMAL HIGH (ref 70–99)
Glucose-Capillary: 151 mg/dL — ABNORMAL HIGH (ref 70–99)
Glucose-Capillary: 122 mg/dL — ABNORMAL HIGH (ref 70–99)
Glucose-Capillary: 120 mg/dL — ABNORMAL HIGH (ref 70–99)

## 2020-05-30 LAB — BASIC METABOLIC PANEL
Anion gap: 11 (ref 5–15)
BUN: 22 mg/dL — ABNORMAL HIGH (ref 6–20)
CO2: 24 mmol/L (ref 22–32)
Calcium: 8.9 mg/dL (ref 8.9–10.3)
Chloride: 98 mmol/L (ref 98–111)
Creatinine, Ser: 3.85 mg/dL — ABNORMAL HIGH (ref 0.61–1.24)
GFR, Estimated: 18 mL/min — ABNORMAL LOW (ref >60)
Glucose, Bld: 122 mg/dL — ABNORMAL HIGH (ref 70–99)
Potassium: 4.2 mmol/L (ref 3.5–5.1)
Sodium: 133 mmol/L — ABNORMAL LOW (ref 135–145)

## 2020-05-30 LAB — CBC
HCT: 30.7 % — ABNORMAL LOW (ref 39.0–52.0)
Hemoglobin: 9.5 g/dL — ABNORMAL LOW (ref 13.0–17.0)
MCH: 27.1 pg (ref 26.0–34.0)
MCHC: 30.9 g/dL (ref 30.0–36.0)
MCV: 87.7 fL (ref 80.0–100.0)
Platelets: 207 10*3/uL (ref 150–400)
RBC: 3.5 MIL/uL — ABNORMAL LOW (ref 4.22–5.81)
RDW: 18.1 % — ABNORMAL HIGH (ref 11.5–15.5)
WBC: 10 10*3/uL (ref 4.0–10.5)
nRBC: 0 % (ref 0.0–0.2)

## 2020-05-30 NOTE — Plan of Care (Signed)
?  Problem: Safety: ?Goal: Ability to remain free from injury will improve ?Outcome: Progressing ?  ?Problem: Activity: ?Goal: Risk for activity intolerance will decrease ?Outcome: Progressing ?  ?Problem: Coping: ?Goal: Level of anxiety will decrease ?Outcome: Progressing ?  ?

## 2020-05-30 NOTE — Progress Notes (Addendum)
HD#31 Subjective:   No acute events overnight.  Evaluated at bedside during morning rounds. He states that he is doing well, and that his previous diarrhea is resolving. He is getting up and down in bed and moving to the chair to mobilize. He does endorse that if he stands too quickly he feels like the blood from his head "goes down" and he starts to get tunnel vision. We discussed his abdominal binder and SCDs, which he will work with PT with on Monday. All questions and concerns addressed at bedside.    Objective:  Vital signs in last 24 hours: Vitals:   05/29/20 1315 05/29/20 2000 05/30/20 0340 05/30/20 0822  BP: (!) 151/94 (!) 150/93 (!) 126/101 (!) 134/97  Pulse: 98 89 93 95  Resp: 18 18 17 18   Temp: 98.6 F (37 C) 98.8 F (37.1 C) 98.4 F (36.9 C) 98.9 F (37.2 C)  TempSrc: Oral Oral Oral   SpO2: 98% 98% 99% 99%  Weight: (!) 155.3 kg     Height:       Supplemental O2: Room Air SpO2: 99 % O2 Flow Rate (L/min): 25 L/min   Physical Exam:  Physical Exam Constitutional:      General: He is not in acute distress.    Appearance: He is well-developed. He is not ill-appearing or toxic-appearing.  Cardiovascular:     Rate and Rhythm: Normal rate and regular rhythm.     Heart sounds: No murmur heard. No friction rub. No gallop.   Pulmonary:     Effort: Pulmonary effort is normal.     Breath sounds: Normal breath sounds. No decreased breath sounds, wheezing or rhonchi.  Neurological:     Mental Status: He is alert and oriented to person, place, and time.    Filed Weights   05/27/20 0858 05/29/20 0910 05/29/20 1315  Weight: (!) 157.9 kg (!) 156.9 kg (!) 155.3 kg     Intake/Output Summary (Last 24 hours) at 05/30/2020 1140 Last data filed at 05/30/2020 0457 Gross per 24 hour  Intake 640 ml  Output 1600 ml  Net -960 ml   Net IO Since Admission: -22,983.45 mL [05/30/20 1140]  Pertinent Labs: CBC Latest Ref Rng & Units 05/30/2020 05/29/2020 05/28/2020  WBC 4.0 - 10.5  K/uL 10.0 9.5 10.0  Hemoglobin 13.0 - 17.0 g/dL 9.5(L) 9.4(L) 9.5(L)  Hematocrit 39.0 - 52.0 % 30.7(L) 31.6(L) 30.3(L)  Platelets 150 - 400 K/uL 207 196 217    CMP Latest Ref Rng & Units 05/30/2020 05/29/2020 05/28/2020  Glucose 70 - 99 mg/dL 122(H) 190(H) 131(H)  BUN 6 - 20 mg/dL 22(H) 38(H) 23(H)  Creatinine 0.61 - 1.24 mg/dL 3.85(H) 5.40(H) 4.02(H)  Sodium 135 - 145 mmol/L 133(L) 134(L) 135  Potassium 3.5 - 5.1 mmol/L 4.2 4.6 4.3  Chloride 98 - 111 mmol/L 98 99 100  CO2 22 - 32 mmol/L 24 24 26   Calcium 8.9 - 10.3 mg/dL 8.9 9.2 9.1  Total Protein 6.5 - 8.1 g/dL - - -  Total Bilirubin 0.3 - 1.2 mg/dL - - -  Alkaline Phos 38 - 126 U/L - - -  AST 15 - 41 U/L - - -  ALT 0 - 44 U/L - - -    Imaging: No results found.  Assessment/Plan:   Principal Problem:   Sacral osteomyelitis (HCC) Active Problems:   Insulin dependent type 2 diabetes mellitus (HCC)   HTN (hypertension)   History of anemia due to chronic kidney disease   Pressure  injury of both heels, unstageable ([place])   Pneumonia due to COVID-19 virus   ESRD (end stage renal disease) ([place])   Avascular necrosis of first metatarsal, left foot ([place])   Osteomyelitis of dorsal first metatarsal, left foot ([place])   Decubitus ulcer of sacral region, unstageable ([place])   Morbid obesity ([place])  Patient Summary: Fred [name] is 50yo male with ESRD, hypertension, insulin-depending type II diabetes mellitus admitted 04/29/20 for sacral decubitus ulcer and COVID-19 pneumonia, continuing to progress well on IV antibiotics, awaiting CIR bed placement.   #Unstageable sacral decubitus ulcer #Coccygeal osteomyelitis Remains on long term antibiotics. Currently awaiting CIR placement. Discussed patient's case with PT who suspect his hypotension is contributing and limiting his progress.  - continue vanc/cefepime (end date 06/10/20) - Wound care per [facility] recs - pain management with tylenol q6h prn and percocet q4h prn - Air mattress, off-loading  sacral wound - continue with PT/OT while waiting for CIR   #ESRD on HD Will resume TTS scheduling for HD. Nephrology following. Discussed with nephro APP concerning patient's hypotension. She noted will attempt to keep patient even or slightly positive in setting of his orthostatic hypotension.  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 Sharol Given for progression of dry gangrene of left 4th toe. Per patient, Dr. Sharol Given would like him to follow up on outpatient basis. - follow up with ortho outpatient - C/w antibiotics - C/w wound care, Prevalon boots  #Hx of Hypertension #Orthostatic Patient continues to be orthostatic when working with PT. Discontinued home anti-hypertensives.  - abdominal binder and scds with PT sessions - Continue to hold amlodipine and metoprolol. Restart once patient no longer hypotensive  -Abdominal belt, SCD during PT sessions  #Deconditioning #Generalized weakness PT continuing to work with patient. Able to tolerate longer sessions of therapy. - Continue feeding supplements for nutrition - Continue to move patient to chair twice daily - PT/OT - Pending CIR placement  Hx of Type II DM Glucose readings have improved over the last 24 hours. Currently receiving 14 U lantus in the evening.  - SSI with meals - nightly lantus 14 U  Diet: Carb-Modified IVF: None,None VTE: Heparin Code: Full PT/OT recs: CIR  Dispo: Anticipated discharge to Skilled nursing facility pending CIR bed availability.  Maudie Mercury, MD Internal Medicine Resident PGY-2 Pager [phone] Please contact the on call pager after 5 pm and on weekends at [phone].

## 2020-05-30 NOTE — Progress Notes (Signed)
Pharmacy Antibiotic Note- follow up   Fred [name] is a 50 y.o. male admitted on 04/29/2020 with coccygeal osteomyelitis.  Pharmacy consulted for vancomycin and cefepime dosing - ID planning to treat x 6 weeks through 06/10/20. Patient was previously on MWF HD and has been transitioned back to TTS.  Tolerating HD sessions.  Pre-HD vancomycin level yesterday was drawn after HD and after vancomycin infusion; vancomycin level 31 yesterday is not reliable and levels were previously therapeutic.  Afebrile, WBC WNL. Will reorder pre-HD vancomycin level on Tuesday.   Plan: Vanc 1gm IV qHD TTS Cefepime 2 g IV qTTS Monitor HD tolerance, clinical progress, check pre-HD vanc level on Tues 1/11   Height: 6\' 6"  (198.1 cm) Weight: (!) 155.3 kg (342 lb 6 oz) (155.3kg) IBW/kg (Calculated) : 91.4  Temp (24hrs), Avg:98.7 F (37.1 C), Min:98.4 F (36.9 C), Max:98.9 F (37.2 C)  Recent Labs  Lab 05/25/20 0309 05/26/20 0149 05/27/20 0152 05/28/20 0546 05/29/20 0213 05/29/20 1458  WBC 8.6 9.4 10.1 10.0 9.5  --   CREATININE 5.67* 3.99* 5.81* 4.02* 5.40*  --   VANCOTROUGH  --   --   --   --   --  31*  VANCORANDOM  --   --   --   --   --  31    Estimated Creatinine Clearance: 27.4 mL/min (A) (by C-G formula based on SCr of 5.4 mg/dL (H)).    Allergies  Allergen Reactions  . Atorvastatin Hives    NOT on MAR    Vanc 12/9 >> (1/20) Cefepime 12/9 >> (1/20)  Remdesivir 12/9 >>12/14  12/20 pre-HD VL: 18 mcg/mL on 1g qMWF >> no change 12/30 pre HD VL: 23 mcg/mL on 1g qTTS >> no change   12/9 BCx: negative  12/9 COVID: Positive  Alfonse Spruce, PharmD PGY2 ID Pharmacy Resident Phone between 7 am - 3:30 pm: [phone]  Please check AMION for all [place] phone numbers After 10:00 PM, call [place] [phone]  05/30/2020, 7:10 AM

## 2020-05-30 NOTE — Progress Notes (Addendum)
[facility] Progress Note   Subjective:   Seen in room. Diarrhea today. No cp, sob. Had dialysis yesterday net UF 1.6L. No issues on dialysis.   Objective Vitals:   05/29/20 1315 05/29/20 2000 05/30/20 0340 05/30/20 0822  BP: (!) 151/94 (!) 150/93 (!) 126/101 (!) 134/97  Pulse: 98 89 93 95  Resp: 18 18 17 18   Temp: 98.6 F (37 C) 98.8 F (37.1 C) 98.4 F (36.9 C) 98.9 F (37.2 C)  TempSrc: Oral Oral Oral   SpO2: 98% 98% 99% 99%  Weight: (!) 155.3 kg     Height:       Physical Exam General:Well developed man, NAD. Room air. Heart:RRR, no murmur Lungs:CTA bilaterally without wheezing, rhonchi or rales Abdomen:Soft, non-tender Extremities:No LE edema Dialysis Access:R IJ [facility]  Additional Objective Labs: Basic Metabolic Panel: Recent Labs  Lab 05/24/20 0339 05/25/20 0309 05/28/20 0546 05/29/20 0213 05/30/20 0649  NA 134*   < > 135 134* 133*  K 3.9   < > 4.3 4.6 4.2  CL 97*   < > 100 99 98  CO2 25   < > 26 24 24   GLUCOSE 186*   < > 131* 190* 122*  BUN 30*   < > 23* 38* 22*  CREATININE 4.61*   < > 4.02* 5.40* 3.85*  CALCIUM 9.3   < > 9.1 9.2 8.9  PHOS 6.5*  --   --   --   --    < > = values in this interval not displayed.   Liver Function Tests: Recent Labs  Lab 05/24/20 0339  ALBUMIN 1.7*   CBC: Recent Labs  Lab 05/26/20 0149 05/27/20 0152 05/28/20 0546 05/29/20 0213 05/30/20 0649  WBC 9.4 10.1 10.0 9.5 10.0  HGB 9.0* 9.2* 9.5* 9.4* 9.5*  HCT 29.0* 29.3* 30.3* 31.6* 30.7*  MCV 87.6 86.7 87.8 88.5 87.7  PLT 257 256 217 196 207   Medications: . sodium chloride    . sodium chloride    . ceFEPime (MAXIPIME) IV 2 g (05/29/20 1023)  . vancomycin 1,000 mg (05/27/20 1442)   . (feeding supplement) PROSource Plus  30 mL Oral TID BM  . Chlorhexidine Gluconate Cloth  6 each Topical Q0600  . Chlorhexidine Gluconate Cloth  6 each Topical Q0600  . darbepoetin (ARANESP) injection - DIALYSIS  200 mcg Intravenous Q Thu-HD  . famotidine   20 mg Oral QHS  . feeding supplement (NEPRO CARB STEADY)  237 mL Oral BID BM  . heparin injection (subcutaneous)  5,000 Units Subcutaneous Q8H  . insulin aspart  0-5 Units Subcutaneous QHS  . insulin aspart  0-9 Units Subcutaneous TID WC  . insulin glargine  14 Units Subcutaneous QHS  . metoCLOPramide  5 mg Oral TID AC  . multivitamin  1 tablet Oral QHS  . polyethylene glycol  17 g Oral Daily  . senna  1 tablet Oral BID  . sevelamer carbonate  800 mg Oral TID WC  . sodium chloride flush  3 mL Intravenous Q12H    Dialysis Orders: NWTTS 4h 400/800 143.5kg 3K/2.5Ca TDCHeparin3000units IV TIW - Mircera168mcgIVq2wks - last 12/8 - Venofer 100mg IVqHD x10 - completed 6/10 doses  Assessment/Plan: 1. Sacral decub/coccygealosteo: On Vanc/Cefepime x 6 weeks (thru 06/10/20) - ID following.Continues hydrotherapy.Placement pending. 2. 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 3. COVID PNA- tested positive on 11/30, is off isolation now. 4. ESRD: Continue HD per TTS schedule - next 1/11 5. BP/volume: No edemaor  SOB, some issues with hypotension. Amlodipine and metoprolol d/c'd. BPs better.  Keep SBP > 100 on HD.  6. Anemiaof CKD- Hgb9.5,getting Aranespq Thursday - dose increased to 237mcg. 7. Secondary Hyperparathyroidism: CorrCaand Phos high.Not on VDRA.Using lowest available Ca bath with HD.Started Renvela 1/meals. 8. Nutrition: Alb very low, continue Nepro + pro-source supplements. 9. DM T2 - per admit   Lynnda Child PA-C [facility] 05/30/2020,9:23 AM

## 2020-05-31 DIAGNOSIS — R197 Diarrhea, unspecified: Secondary | ICD-10-CM

## 2020-05-31 LAB — GLUCOSE, CAPILLARY
Glucose-Capillary: 100 mg/dL — ABNORMAL HIGH (ref 70–99)
Glucose-Capillary: 100 mg/dL — ABNORMAL HIGH (ref 70–99)
Glucose-Capillary: 168 mg/dL — ABNORMAL HIGH (ref 70–99)
Glucose-Capillary: 145 mg/dL — ABNORMAL HIGH (ref 70–99)

## 2020-05-31 LAB — BASIC METABOLIC PANEL
Anion gap: 10 (ref 5–15)
BUN: 31 mg/dL — ABNORMAL HIGH (ref 6–20)
CO2: 25 mmol/L (ref 22–32)
Calcium: 9.5 mg/dL (ref 8.9–10.3)
Chloride: 100 mmol/L (ref 98–111)
Creatinine, Ser: 5.42 mg/dL — ABNORMAL HIGH (ref 0.61–1.24)
GFR, Estimated: 12 mL/min — ABNORMAL LOW (ref >60)
Glucose, Bld: 93 mg/dL (ref 70–99)
Potassium: 4.5 mmol/L (ref 3.5–5.1)
Sodium: 135 mmol/L (ref 135–145)

## 2020-05-31 LAB — PHOSPHORUS: Phosphorus: 5.9 mg/dL — ABNORMAL HIGH (ref 2.5–4.6)

## 2020-05-31 MED ORDER — CHLORHEXIDINE GLUCONATE CLOTH 2 % EX PADS
6.0000 | MEDICATED_PAD | Freq: Every day | CUTANEOUS | Status: DC
  Administered 2020-06-01 – 2020-06-02 (×2): 6 via TOPICAL

## 2020-05-31 NOTE — Progress Notes (Signed)
HD#32 Subjective:   No acute events overnight.  Evaluated at bedside during morning rounds. He reports abdominal and butt pain that believes are from his multiple episodes of diarrhea. He notes nursing staff has been applying baby powder. Denies fevers or chills. He has no other complaints at this time.   Objective:  Vital signs in last 24 hours: Vitals:   05/30/20 1936 05/31/20 0252 05/31/20 0500 05/31/20 0816  BP: (!) 161/106 (!) 142/88  138/81  Pulse: 100 88  94  Resp: 18 15  16   Temp: 98.5 F (36.9 C) 98.1 F (36.7 C)  97.8 F (36.6 C)  TempSrc: Oral Oral  Oral  SpO2: 100% 97%  99%  Weight:   (!) 158.3 kg   Height:       Supplemental O2: Room Air SpO2: 99 % O2 Flow Rate (L/min): 25 L/min   Physical Exam:  Physical Exam Constitutional:      General: He is not in acute distress.    Appearance: He is well-developed. He is not ill-appearing or toxic-appearing.  Cardiovascular:     Rate and Rhythm: Normal rate and regular rhythm.     Heart sounds: No murmur heard. No friction rub. No gallop.   Pulmonary:     Effort: Pulmonary effort is normal.     Breath sounds: Normal breath sounds. No decreased breath sounds, wheezing or rhonchi.  Neurological:     Mental Status: He is alert and oriented to person, place, and time.    Filed Weights   05/29/20 0910 05/29/20 1315 05/31/20 0500  Weight: (!) 156.9 kg (!) 155.3 kg (!) 158.3 kg     Intake/Output Summary (Last 24 hours) at 05/31/2020 0827 Last data filed at 05/30/2020 2133 Gross per 24 hour  Intake 483 ml  Output --  Net 483 ml   Net IO Since Admission: -22,260.45 mL [05/31/20 0827]  Pertinent Labs: CBC Latest Ref Rng & Units 05/30/2020 05/29/2020 05/28/2020  WBC 4.0 - 10.5 K/uL 10.0 9.5 10.0  Hemoglobin 13.0 - 17.0 g/dL 9.5(L) 9.4(L) 9.5(L)  Hematocrit 39.0 - 52.0 % 30.7(L) 31.6(L) 30.3(L)  Platelets 150 - 400 K/uL 207 196 217    CMP Latest Ref Rng & Units 05/30/2020 05/29/2020 05/28/2020  Glucose 70 - 99 mg/dL  122(H) 190(H) 131(H)  BUN 6 - 20 mg/dL 22(H) 38(H) 23(H)  Creatinine 0.61 - 1.24 mg/dL 3.85(H) 5.40(H) 4.02(H)  Sodium 135 - 145 mmol/L 133(L) 134(L) 135  Potassium 3.5 - 5.1 mmol/L 4.2 4.6 4.3  Chloride 98 - 111 mmol/L 98 99 100  CO2 22 - 32 mmol/L 24 24 26   Calcium 8.9 - 10.3 mg/dL 8.9 9.2 9.1  Total Protein 6.5 - 8.1 g/dL - - -  Total Bilirubin 0.3 - 1.2 mg/dL - - -  Alkaline Phos 38 - 126 U/L - - -  AST 15 - 41 U/L - - -  ALT 0 - 44 U/L - - -    Imaging: No results found.  Assessment/Plan:   Principal Problem:   Sacral osteomyelitis (HCC) Active Problems:   Insulin dependent type 2 diabetes mellitus (HCC)   HTN (hypertension)   History of anemia due to chronic kidney disease   Pressure injury of both heels, unstageable ([place])   Pneumonia due to COVID-19 virus   ESRD (end stage renal disease) (HCC)   Avascular necrosis of first metatarsal, left foot (HCC)   Osteomyelitis of dorsal first metatarsal, left foot (HCC)   Decubitus ulcer of sacral region, unstageable (  [place])   Morbid obesity [facility])  Patient Summary: Fred [name] is 50yo male with ESRD, hypertension, insulin-depending type II diabetes mellitus admitted 04/29/20 for sacral decubitus ulcer and COVID-19 pneumonia, continuing to progress well on IV antibiotics, awaiting CIR bed placement.   #Unstageable sacral decubitus ulcer #Coccygeal osteomyelitis Remains on long term antibiotics. Currently awaiting CIR placement. Discussed patient's case with PT who suspect his hypotension is contributing and limiting his progress.  - continue vanc/cefepime (end date 06/10/20) - Wound care per [facility] recs - pain management with tylenol q6h prn and percocet q4h prn - Air mattress, off-loading sacral wound - continue with PT/OT while waiting for CIR   #ESRD on HD Will resume TTS scheduling for HD. Nephrology following. Dialysis tomorrow.  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 Sharol Given for progression of dry gangrene of left 4th toe. Per patient, Dr. Sharol Given would like him to follow up on outpatient basis. - follow up with ortho outpatient - C/w antibiotics - C/w wound care, Prevalon boots  #Hx of Hypertension #Orthostatic Patient continues to be orthostatic when working with PT. Discontinued home anti-hypertensives.  - abdominal binder and scds with PT sessions, were not in room today, so were unable to use. Will reach out to nursing staff.  - Continue to hold amlodipine and metoprolol. Restart once patient no longer hypotensive  -Abdominal belt, SCD during PT sessions  #Deconditioning #Generalized weakness PT continuing to work with patient. Able to tolerate longer sessions of therapy. - Continue feeding supplements for nutrition - Continue to move patient to chair twice daily - PT/OT - Pending CIR placement  Hx of Type II DM Glucose readings have improved over the last 24 hours. Currently receiving 14 U lantus in the evening.  - SSI with meals - nightly lantus 14 U  #Diarrhea Patient endorses multiple episodes of diarrhea. He is unsure if it is foul smelling or the color.  - will discontinue senna and miralax.  - if continues to have diarrhea, will consider stool panel/enteric precautions.   Diet: Carb-Modified IVF: None,None VTE: Heparin Code: Full PT/OT recs: CIR  Dispo: Anticipated discharge to Skilled nursing facility pending CIR bed availability.  Sanjuana Letters DO Internal Medicine Resident PGY-2 Pager [phone] Please contact the on call pager after 5 pm and on weekends at [phone].

## 2020-05-31 NOTE — Progress Notes (Signed)
Inpatient Rehab Admissions Coordinator:   Met with patient at bedside while he was working with therapy.  He continues to be extremely motivated, and was able to stand with +2 assist, however, he is still profoundly orthostatic upon standing.  BP dropped from 947S systolic to 962E systolic going from supine>sit>standing.  No abdominal binder or SCDs in room at this time.  Feel pt would greatly benefit from CIR, but continues to be limited by orthostatic hypotension.  Will continue to follow for progress.   Shann Medal, PT, DPT Admissions Coordinator [phone] 05/31/20  3:16 PM

## 2020-05-31 NOTE — Plan of Care (Signed)
  Problem: Education: Goal: Knowledge of risk factors and measures for prevention of condition will improve Outcome: Progressing   Problem: Coping: Goal: Psychosocial and spiritual needs will be supported Outcome: Progressing   Problem: Respiratory: Goal: Will maintain a patent airway Outcome: Progressing   

## 2020-05-31 NOTE — Care Management Important Message (Signed)
Important Message  [name] MRN: [id number] Date of Birth: 1970-11-16   Medicare Important Message Given:  Yes     Kitrina Maurin P Anselma Herbel 05/31/2020, 2:22 PM

## 2020-05-31 NOTE — Progress Notes (Signed)
SCDs applied to patient, abdominal binder ordered by charge nurse, will place in patient's room once sent to unit.

## 2020-05-31 NOTE — Plan of Care (Signed)
  Problem: Education: Goal: Knowledge of risk factors and measures for prevention of condition will improve Outcome: Progressing   Problem: Coping: Goal: Psychosocial and spiritual needs will be supported Outcome: Progressing   Problem: Education: Goal: Knowledge of General Education information will improve Description: Including pain rating scale, medication(s)/side effects and non-pharmacologic comfort measures Outcome: Progressing   Problem: Activity: Goal: Risk for activity intolerance will decrease Outcome: Progressing   Problem: Pain Managment: Goal: General experience of comfort will improve Outcome: Progressing

## 2020-05-31 NOTE — Progress Notes (Signed)
Physical Therapy Treatment Patient Details Name: Fred [name] MRN: [id number] DOB: 1970-10-04 Today's Date: 05/31/2020    History of Present Illness Pt is a 50 y.o. male admitted from [facility] on 04/29/20 for evaluation of low oxygen saturations 82% on room air; concern for sepsis form Covid PNA, sacral wound and foot wounds. 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 Of note, pt recently admitted to [facility] due to gallbladder problems then d/c to [facility](14 days) for rehab then readmitted to [facility] then d/c to [facility] for rehab. PMH includes ESRD on HD (started 9/21), HTN, DM, C. diff.    PT Comments    Pt eager to progress OOB today. Pt successfully stood x3 at EOB with bilateral HHA and mod +2 assist, assist for rise and steady as well as LE blocking to reach full standing. Pt with symptomatic orthostatic hypotension, despite natural BP-elevating strategies including sustained EOB sitting, UE and LE exercise. PT continuing to recommend abdominal binder and LE SCDs, none present in room and RN informed.   BP,HR supine: 158/109, 105 BP,HR sitting: 136/95, 116 BP, HR standing 1st attempt: 101/76, 146 BP, HR standing 2nd attempt: 119/88, 149   Follow Up Recommendations  CIR;Supervision for mobility/OOB     Equipment Recommendations  Wheelchair (measurements PT);Wheelchair cushion (measurements PT);Hospital bed;Other (comment) (hoyer lift)    Recommendations for Other Services       Precautions / Restrictions Precautions Precautions: Fall Precaution Comments: wounds on bilat feet and L sacrum, monitor orthostatic BPs Required Braces or Orthoses: Other Brace Other Brace: B prevalon boots Restrictions Weight Bearing Restrictions: No    Mobility  Bed Mobility Overal bed mobility: Needs Assistance Bed Mobility: Rolling;Supine to Sit;Sit to Supine Rolling: Modified independent (Device/Increase  time)   Supine to sit: Min assist Sit to supine: Min assist   General bed mobility comments: min assist for trunk elevation to come to EOB, min assist for return to supine for LE lifting into bed. Pt able to roll bilaterally for pericare  Transfers Overall transfer level: Needs assistance Equipment used: 2 person hand held assist Transfers: Sit to/from Stand Sit to Stand: Mod assist;+2 safety/equipment;+2 physical assistance         General transfer comment: orthostatic vitals taken. Sit to stand x4 attempts, able to complete stand x 3. Assist for initial power up, rise, LE blocking, and steadying. no abdominal binder or SCDs in room at this time, RN notified.  Ambulation/Gait             General Gait Details: unable   Stairs             Wheelchair Mobility    Modified Rankin (Stroke Patients Only)       Balance Overall balance assessment: Needs assistance Sitting-balance support: Feet supported;Single extremity supported Sitting balance-Leahy Scale: Fair Sitting balance - Comments: able to sit EOB with close supervision   Standing balance support: Bilateral upper extremity supported Standing balance-Leahy Scale: Poor Standing balance comment: reliant on UE support                            Cognition Arousal/Alertness: Awake/alert Behavior During Therapy: WFL for tasks assessed/performed;Flat affect Overall Cognitive Status: No family/caregiver present to determine baseline cognitive functioning Area of Impairment: Attention;Following commands                   Current Attention Level: Selective   Following Commands: Follows  one step commands consistently;Follows one step commands with increased time Safety/Judgement: Decreased awareness of safety;Decreased awareness of deficits Awareness: Emergent Problem Solving: Slow processing;Requires verbal cues General Comments: Pt pleasant and motivated to complete all tasks, increased  processing times and at times poor insight into functional deficits      Exercises General Exercises - Lower Extremity Long Arc Quad: AROM;Both;20 reps;Seated Shoulder Exercises Shoulder Flexion: AROM;Both;15 reps;Seated    General Comments        Pertinent Vitals/Pain Pain Assessment: Faces Faces Pain Scale: Hurts a little bit Pain Location: back, abdomen Pain Descriptors / Indicators: Sore Pain Intervention(s): Limited activity within patient's tolerance;Monitored during session;Repositioned    Home Living                      Prior Function            PT Goals (current goals can now be found in the care plan section) Acute Rehab PT Goals Patient Stated Goal: to get better and not be dizzy PT Goal Formulation: With patient Time For Goal Achievement: 06/09/20 Potential to Achieve Goals: Fair Progress towards PT goals: Progressing toward goals    Frequency    Min 3X/week      PT Plan Current plan remains appropriate    Co-evaluation PT/OT/SLP Co-Evaluation/Treatment: Yes Reason for Co-Treatment: For patient/therapist safety;To address functional/ADL transfers;Complexity of the patient's impairments (multi-system involvement) PT goals addressed during session: Mobility/safety with mobility;Balance OT goals addressed during session: ADL's and self-care;Proper use of Adaptive equipment and DME      AM-PAC PT "6 Clicks" Mobility   Outcome Measure  Help needed turning from your back to your side while in a flat bed without using bedrails?: A Little Help needed moving from lying on your back to sitting on the side of a flat bed without using bedrails?: A Little Help needed moving to and from a bed to a chair (including a wheelchair)?: A Lot Help needed standing up from a chair using your arms (e.g., wheelchair or bedside chair)?: A Lot Help needed to walk in hospital room?: Total Help needed climbing 3-5 steps with a railing? : Total 6 Click Score:  12    End of Session   Activity Tolerance: Patient tolerated treatment well Patient left: in bed;with call bell/phone within reach;Other (comment) (patient in chair position) Nurse Communication: Mobility status;Other (comment) (needs SCDs, abdominal binder) PT Visit Diagnosis: Muscle weakness (generalized) (M62.81);Difficulty in walking, not elsewhere classified (R26.2)     Time: 2952-8413 PT Time Calculation (min) (ACUTE ONLY): 29 min  Charges:  $Therapeutic Activity: 8-22 mins                     Stacie Glaze, PT Acute Rehabilitation Services Pager [phone]  Office [phone]   Louis Matte 05/31/2020, 4:48 PM

## 2020-05-31 NOTE — Progress Notes (Signed)
[facility] Progress Note   Subjective:   Patient seen and examined at bedside.  Complains of diarrhea x2 days described as soft loose stool.  Denies CP, SOB, abdominal pain and n/v.  Reports he has not been eating much last few days d/t diarrhea.   Objective Vitals:   05/30/20 1936 05/31/20 0252 05/31/20 0500 05/31/20 0816  BP: (!) 161/106 (!) 142/88  138/81  Pulse: 100 88  94  Resp: 18 15  16   Temp: 98.5 F (36.9 C) 98.1 F (36.7 C)  97.8 F (36.6 C)  TempSrc: Oral Oral  Oral  SpO2: 100% 97%  99%  Weight:   (!) 158.3 kg   Height:       Physical Exam General: chronically ill appearing male in NAD. Heart:RRR Lungs:CTAB Abdomen:soft, NTND Extremities:no LE edema Dialysis Access: R IJ [facility] c/d/i   Filed Weights   05/29/20 0910 05/29/20 1315 05/31/20 0500  Weight: (!) 156.9 kg (!) 155.3 kg (!) 158.3 kg    Intake/Output Summary (Last 24 hours) at 05/31/2020 1315 Last data filed at 05/31/2020 0900 Gross per 24 hour  Intake 363 ml  Output -  Net 363 ml    Additional Objective Labs: Basic Metabolic Panel: Recent Labs  Lab 05/29/20 0213 05/30/20 0649 05/31/20 0802  NA 134* 133* 135  K 4.6 4.2 4.5  CL 99 98 100  CO2 24 24 25   GLUCOSE 190* 122* 93  BUN 38* 22* 31*  CREATININE 5.40* 3.85* 5.42*  CALCIUM 9.2 8.9 9.5   CBC: Recent Labs  Lab 05/26/20 0149 05/27/20 0152 05/28/20 0546 05/29/20 0213 05/30/20 0649  WBC 9.4 10.1 10.0 9.5 10.0  HGB 9.0* 9.2* 9.5* 9.4* 9.5*  HCT 29.0* 29.3* 30.3* 31.6* 30.7*  MCV 87.6 86.7 87.8 88.5 87.7  PLT 257 256 217 196 207   CBG: Recent Labs  Lab 05/30/20 1226 05/30/20 1639 05/30/20 1937 05/31/20 0648 05/31/20 1124  GLUCAP 151* 122* 120* 100* 100*    Medications: . sodium chloride    . sodium chloride    . ceFEPime (MAXIPIME) IV 2 g (05/29/20 1023)  . vancomycin 1,000 mg (05/27/20 1442)   . (feeding supplement) PROSource Plus  30 mL Oral TID BM  . Chlorhexidine Gluconate Cloth  6 each Topical  Q0600  . Chlorhexidine Gluconate Cloth  6 each Topical Q0600  . darbepoetin (ARANESP) injection - DIALYSIS  200 mcg Intravenous Q Thu-HD  . famotidine  20 mg Oral QHS  . feeding supplement (NEPRO CARB STEADY)  237 mL Oral BID BM  . heparin injection (subcutaneous)  5,000 Units Subcutaneous Q8H  . insulin aspart  0-5 Units Subcutaneous QHS  . insulin aspart  0-9 Units Subcutaneous TID WC  . insulin glargine  14 Units Subcutaneous QHS  . metoCLOPramide  5 mg Oral TID AC  . multivitamin  1 tablet Oral QHS  . sevelamer carbonate  800 mg Oral TID WC  . sodium chloride flush  3 mL Intravenous Q12H    Dialysis Orders: NWTTS 4h 400/800 143.5kg 3K/2.5Ca TDCHeparin3000units IV TIW - Mircera183mcgIVq2wks - last 12/8 - Venofer 100mg IVqHD x10 - completed 6/10 doses  Assessment/Plan: 1. Sacral decub/coccygealosteo: On Vanc/Cefepime x 6 weeks (thru 06/10/20) - ID following.Continues hydrotherapy.Placement pending. 2. 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 3. COVID PNA- tested positive on 11/30, is off isolation now. 4. ESRD:on HD TTS.  NExt HD tomorrow per regular schedule. K 4.5.  5. BP/volume: Bp well controlled today.  Has had  issues with hypotension which improved once amlodipine and metoprolol d/c'd. Keep SBP > 100 on HD. Appears euvolemic on exam. UF as tolerated.  6. Anemiaof CKD- last Hgb9.5,cont Aranesp 264mcgq Thursday. 7. Secondary Hyperparathyroidism: CorrCaand Phos high.Not on VDRA.Using lowest available Ca bath with HD.Started Renvela 1/meals.  Recheck labs. 8. Nutrition: last Alb very low, continue Nepro + pro-source supplements. 9. DM T2 - per admit   Jen Mow, PA-C [place] 05/31/2020,1:15 PM  LOS: 32 days

## 2020-05-31 NOTE — Progress Notes (Signed)
Occupational Therapy Treatment Patient Details Name: Fred [name] MRN: [id number] DOB: 24-Nov-1970 Today's Date: 05/31/2020    History of present illness Pt is a 50 y.o. male admitted from [facility] on 04/29/20 for evaluation of low oxygen saturations 82% on room air; concern for sepsis form Covid PNA, sacral wound and foot wounds. 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 Of note, pt recently admitted to [facility] due to gallbladder problems then d/c to [facility](14 days) for rehab then readmitted to [facility] then d/c to [facility] for rehab. PMH includes ESRD on HD (started 9/21), HTN, DM, C. diff.   OT comments  Pt making good progress with functional goals, although limited by BP dropping with change in positions. Sup - sit to EOB min guard A. Sit to stand from EOB 2 person HHA mod A +2  X 3 attempts, able to complete stand x 2.  No abdominal binder or SCDs in room at this time. OT will continue to follow acutely to maximize level of function and safety Orthostatic vitals taken sup 158/109, sit 136/95, stand #1 101/76, stand #2 119/88   Follow Up Recommendations  CIR;Supervision/Assistance - 24 hour    Equipment Recommendations  3 in 1 bedside commode;Wheelchair (measurements OT);Wheelchair cushion (measurements OT);Hospital bed    Recommendations for Other Services      Precautions / Restrictions Precautions Precautions: Fall Precaution Comments: wounds on bilat feet and L sacrum, monitor orthostatic BPs Required Braces or Orthoses: Other Brace Other Brace: B prevalon boots       Mobility Bed Mobility Overal bed mobility: Needs Assistance Bed Mobility: Rolling;Supine to Sit;Sit to Supine Rolling: Modified independent (Device/Increase time)   Supine to sit: Min guard Sit to supine: Min guard   General bed mobility comments: min guard for safety due to tendency to slide on  mattress  Transfers Overall transfer level: Needs assistance Equipment used: 2 person hand held assist Transfers: Sit to/from Stand Sit to Stand: Mod assist;+2 safety/equipment;+2 physical assistance         General transfer comment: orthostatic vitals taken. Sit to stand x3 attempts, able to complete stand x 2 (sup 158/109, sit 136/95, stand #1 101/76, stand #2 119/88). no abdominal binder or SCDs in room at this time    Balance Overall balance assessment: Needs assistance Sitting-balance support: Feet supported;Single extremity supported Sitting balance-Leahy Scale: Fair Sitting balance - Comments: able to sit EOB with close supervision   Standing balance support: Bilateral upper extremity supported Standing balance-Leahy Scale: Poor                             ADL either performed or assessed with clinical judgement   ADL Overall ADL's : Needs assistance/impaired     Grooming: Set up;Supervision/safety;Sitting;Wash/dry face;Oral care;Wash/dry hands   Upper Body Bathing: Min guard;Sitting Upper Body Bathing Details (indicate cue type and reason): simulated seated EOB Lower Body Bathing: Moderate assistance;Sitting/lateral leans Lower Body Bathing Details (indicate cue type and reason): simulated seated EOB         Toilet Transfer: +2 for physical assistance;+2 for safety/equipment;Moderate assistance Toilet Transfer Details (indicate cue type and reason): sit - stand mod A +2 from EOB 2 person HHA Toileting- Clothing Manipulation and Hygiene: Total assistance Toileting - Clothing Manipulation Details (indicate cue type and reason): posterior perihygiene rolling in bed x 2 occasions due to diarrhea     Functional mobility during ADLs: Moderate assistance;+2 for  physical assistance General ADL Comments: Continues to be limited by decreased strength, dropping BP with activities     Vision Patient Visual Report: No change from baseline     Perception      Praxis      Cognition Arousal/Alertness: Awake/alert Behavior During Therapy: WFL for tasks assessed/performed;Flat affect Overall Cognitive Status: Within Functional Limits for tasks assessed                                 General Comments: Pt pleasant and motivated to complete all tasks.        Exercises     Shoulder Instructions       General Comments      Pertinent Vitals/ Pain       Pain Assessment: Faces Faces Pain Scale: Hurts a little bit Pain Location: LBP Pain Descriptors / Indicators: Sore Pain Intervention(s): Monitored during session;Repositioned  Home Living                                          Prior Functioning/Environment              Frequency  Min 2X/week        Progress Toward Goals  OT Goals(current goals can now be found in the care plan section)  Progress towards OT goals: Progressing toward goals  Acute Rehab OT Goals Patient Stated Goal: to get better and not be dizzy  Plan Discharge plan remains appropriate    Co-evaluation    PT/OT/SLP Co-Evaluation/Treatment: Yes Reason for Co-Treatment: Complexity of the patient's impairments (multi-system involvement);For patient/therapist safety;To address functional/ADL transfers   OT goals addressed during session: ADL's and self-care;Proper use of Adaptive equipment and DME      AM-PAC OT "6 Clicks" Daily Activity     Outcome Measure   Help from another person eating meals?: None Help from another person taking care of personal grooming?: A Little Help from another person toileting, which includes using toliet, bedpan, or urinal?: Total Help from another person bathing (including washing, rinsing, drying)?: A Lot Help from another person to put on and taking off regular upper body clothing?: A Little Help from another person to put on and taking off regular lower body clothing?: A Lot 6 Click Score: 15    End of Session    OT Visit  Diagnosis: Other abnormalities of gait and mobility (R26.89);Muscle weakness (generalized) (M62.81);Other symptoms and signs involving cognitive function;Dizziness and giddiness (R42) Pain - part of body:  (back)   Activity Tolerance Patient limited by fatigue;Other (comment) (BP dropping)   Patient Left in bed;with call bell/phone within reach   Nurse Communication          Time: 8032-1224 OT Time Calculation (min): 29 min  Charges: OT General Charges $OT Visit: 1 Visit OT Treatments $Self Care/Home Management : 8-22 mins     Britt Bottom 05/31/2020, 4:26 PM

## 2020-06-01 LAB — CBC
HCT: 30.3 % — ABNORMAL LOW (ref 39.0–52.0)
Hemoglobin: 9.4 g/dL — ABNORMAL LOW (ref 13.0–17.0)
MCH: 26.9 pg (ref 26.0–34.0)
MCHC: 31 g/dL (ref 30.0–36.0)
MCV: 86.8 fL (ref 80.0–100.0)
Platelets: 252 10*3/uL (ref 150–400)
RBC: 3.49 MIL/uL — ABNORMAL LOW (ref 4.22–5.81)
RDW: 17.6 % — ABNORMAL HIGH (ref 11.5–15.5)
WBC: 9.9 10*3/uL (ref 4.0–10.5)
nRBC: 0 % (ref 0.0–0.2)

## 2020-06-01 LAB — GLUCOSE, CAPILLARY
Glucose-Capillary: 90 mg/dL (ref 70–99)
Glucose-Capillary: 85 mg/dL (ref 70–99)
Glucose-Capillary: 151 mg/dL — ABNORMAL HIGH (ref 70–99)
Glucose-Capillary: 155 mg/dL — ABNORMAL HIGH (ref 70–99)

## 2020-06-01 LAB — RENAL FUNCTION PANEL
Albumin: 1.8 g/dL — ABNORMAL LOW (ref 3.5–5.0)
Anion gap: 12 (ref 5–15)
BUN: 43 mg/dL — ABNORMAL HIGH (ref 6–20)
CO2: 25 mmol/L (ref 22–32)
Calcium: 9.3 mg/dL (ref 8.9–10.3)
Chloride: 97 mmol/L — ABNORMAL LOW (ref 98–111)
Creatinine, Ser: 6.47 mg/dL — ABNORMAL HIGH (ref 0.61–1.24)
GFR, Estimated: 10 mL/min — ABNORMAL LOW (ref >60)
Glucose, Bld: 89 mg/dL (ref 70–99)
Phosphorus: 6.6 mg/dL — ABNORMAL HIGH (ref 2.5–4.6)
Potassium: 4.3 mmol/L (ref 3.5–5.1)
Sodium: 134 mmol/L — ABNORMAL LOW (ref 135–145)

## 2020-06-01 LAB — VANCOMYCIN, RANDOM: Vancomycin Rm: 24

## 2020-06-01 MED ORDER — ALTEPLASE 2 MG IJ SOLR
2.0000 mg | Freq: Once | INTRAMUSCULAR | Status: DC | PRN
Start: 2020-06-01 — End: 2020-06-01

## 2020-06-01 MED ORDER — SODIUM CHLORIDE 0.9 % IV SOLN: 100.0000 mL | INTRAVENOUS | Status: DC | PRN

## 2020-06-01 MED ORDER — LIDOCAINE-PRILOCAINE 2.5-2.5 % EX CREA
1.0000 | TOPICAL_CREAM | CUTANEOUS | Status: DC | PRN
Start: 2020-06-01 — End: 2020-06-01

## 2020-06-01 MED ORDER — LIDOCAINE HCL (PF) 1 % IJ SOLN: 5.0000 mL | INTRAMUSCULAR | Status: DC | PRN

## 2020-06-01 MED ORDER — VANCOMYCIN HCL IN DEXTROSE 1-5 GM/200ML-% IV SOLN
INTRAVENOUS | Status: AC
  Filled 2020-06-01: qty 200

## 2020-06-01 MED ORDER — HEPARIN SODIUM (PORCINE) 1000 UNIT/ML DIALYSIS
1000.0000 [IU] | INTRAMUSCULAR | Status: DC | PRN
Start: 2020-06-01 — End: 2020-06-01

## 2020-06-01 MED ORDER — PENTAFLUOROPROP-TETRAFLUOROETH EX AERO: 1.0000 "application " | INHALATION_SPRAY | CUTANEOUS | Status: DC | PRN

## 2020-06-01 MED ORDER — HEPARIN SODIUM (PORCINE) 1000 UNIT/ML IJ SOLN
INTRAMUSCULAR | Status: AC
  Filled 2020-06-01: qty 3

## 2020-06-01 MED ORDER — HEPARIN SODIUM (PORCINE) 1000 UNIT/ML DIALYSIS: 3000.0000 [IU] | INTRAMUSCULAR | Status: DC | PRN

## 2020-06-01 NOTE — Progress Notes (Signed)
Orthopedic Tech Progress Note Patient Details:  Jyair Kiraly Jan 12, 1971 [id number] Left at bedside for patient. Ortho Devices Type of Ortho Device: Abdominal binder Ortho Device/Splint Location: Bilateral Ortho Device/Splint Interventions: Ordered   Post Interventions Patient Tolerated: Well Instructions Provided: Care of device   Jaamal Farooqui A Kearstyn Avitia 06/01/2020, 6:24 PM

## 2020-06-01 NOTE — Progress Notes (Cosign Needed)
HD#33 Subjective:   No acute events overnight.  Evaluated during dialysis session. He states his diarrhea has improved, but when he has a bowel movement he is not able to know when it has passed. He states this has been a chronic problem for him. He notes that an abdominal binder nor SCD were used yesterday during his PT session.   Objective:  Vital signs in last 24 hours: Vitals:   06/01/20 1040 06/01/20 1055 06/01/20 1157 06/01/20 1550  BP: (!) 121/91  (!) 151/103 123/87  Pulse:  94 99 100  Resp:   18 18  Temp:  98.5 F (36.9 C) 97.7 F (36.5 C) 98.5 F (36.9 C)  TempSrc:  Oral Oral Oral  SpO2:  98% 100% 100%  Weight:  (!) 154.8 kg    Height:       Supplemental O2: Room Air SpO2: 100 % O2 Flow Rate (L/min): 25 L/min  Physical Exam Vitals and nursing note reviewed.  Constitutional:      General: He is not in acute distress.    Appearance: Normal appearance. He is not ill-appearing or toxic-appearing.  Cardiovascular:     Rate and Rhythm: Normal rate and regular rhythm.     Pulses: Normal pulses.     Heart sounds: Normal heart sounds. No murmur heard. No gallop.   Abdominal:     Palpations: Abdomen is soft.  Neurological:     General: No focal deficit present.     Mental Status: He is alert and oriented to person, place, and time. Mental status is at baseline.  Psychiatric:        Mood and Affect: Mood normal.        Behavior: Behavior normal.     Filed Weights   06/01/20 0500 06/01/20 0731 06/01/20 1055  Weight: (!) 161 kg (!) 156.8 kg (!) 154.8 kg     Intake/Output Summary (Last 24 hours) at 06/01/2020 1747 Last data filed at 06/01/2020 1300 Gross per 24 hour  Intake 240 ml  Output 2000 ml  Net -1760 ml   Net IO Since Admission: -23,770.45 mL [06/01/20 1747]  Pertinent Labs: CBC Latest Ref Rng & Units 06/01/2020 05/30/2020 05/29/2020  WBC 4.0 - 10.5 K/uL 9.9 10.0 9.5  Hemoglobin 13.0 - 17.0 g/dL 9.4(L) 9.5(L) 9.4(L)  Hematocrit 39.0 - 52.0 % 30.3(L)  30.7(L) 31.6(L)  Platelets 150 - 400 K/uL 252 207 196    CMP Latest Ref Rng & Units 06/01/2020 05/31/2020 05/30/2020  Glucose 70 - 99 mg/dL 89 93 122(H)  BUN 6 - 20 mg/dL 43(H) 31(H) 22(H)  Creatinine 0.61 - 1.24 mg/dL 6.47(H) 5.42(H) 3.85(H)  Sodium 135 - 145 mmol/L 134(L) 135 133(L)  Potassium 3.5 - 5.1 mmol/L 4.3 4.5 4.2  Chloride 98 - 111 mmol/L 97(L) 100 98  CO2 22 - 32 mmol/L 25 25 24   Calcium 8.9 - 10.3 mg/dL 9.3 9.5 8.9  Total Protein 6.5 - 8.1 g/dL - - -  Total Bilirubin 0.3 - 1.2 mg/dL - - -  Alkaline Phos 38 - 126 U/L - - -  AST 15 - 41 U/L - - -  ALT 0 - 44 U/L - - -    Imaging: No results found.  Assessment/Plan:   Principal Problem:   Sacral osteomyelitis (HCC) Active Problems:   Insulin dependent type 2 diabetes mellitus (HCC)   HTN (hypertension)   History of anemia due to chronic kidney disease   Pressure injury of both heels, unstageable ([place])  Pneumonia due to COVID-19 virus   ESRD (end stage renal disease) ([place])   Avascular necrosis of first metatarsal, left foot (HCC)   Osteomyelitis of dorsal first metatarsal, left foot ([place])   Decubitus ulcer of sacral region, unstageable ([place])   Morbid obesity ([place])  Patient Summary: Fred [name] is 50yo male with ESRD, hypertension, insulin-depending type II diabetes mellitus admitted 04/29/20 for sacral decubitus ulcer and COVID-19 pneumonia, continuing to progress well on IV antibiotics, awaiting CIR bed placement.   #Unstageable sacral decubitus ulcer #Coccygeal osteomyelitis Remains on long term antibiotics. Currently awaiting CIR placement. Discussed patient's case with PT who suspect his hypotension is contributing and limiting his progress.  - continue vanc/cefepime (end date 06/10/20) - Wound care per [facility] recs - pain management with tylenol q6h prn and percocet q4h prn - Air mattress, off-loading sacral wound - continue with PT/OT while waiting for CIR   #ESRD on HD Will resume TTS scheduling for HD.  Nephrology following. 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 Sharol Given for progression of dry gangrene of left 4th toe. Per patient, Dr. Sharol Given would like him to follow up on outpatient basis. - follow up with ortho outpatient - C/w antibiotics - C/w wound care, Prevalon boots  #Hx of Hypertension #Orthostatic Patient continues to be orthostatic when working with PT. Discontinued home anti-hypertensives.  - abdominal binder and scds with PT sessions, were not in room yesterday. Notified nursing staff who will work on getting SCD/abdominal binders in place. - Continue to hold amlodipine and metoprolol. Restart once patient no longer hypotensive  -Abdominal belt, SCD during PT sessions  #Deconditioning #Generalized weakness PT continuing to work with patient. Able to tolerate longer sessions of therapy. - Continue feeding supplements for nutrition - Continue to move patient to chair twice daily - PT/OT - Pending CIR placement  Hx of Type II DM Glucose readings improved.  - SSI with meals - nightly lantus 14 U  #Diarrhea Diarrhea episodes have improved. He does endorse not knowing when he is passing his bowels. Will perform rectal exam tomorrow.  - rectal exam tomorrow to assess rectal tone - if continues to have diarrhea, will consider stool panel/enteric precautions.   Diet: Carb-Modified IVF: None,None VTE: Heparin Code: Full PT/OT recs: CIR  Dispo: Anticipated discharge to Skilled nursing facility pending CIR bed availability.  Sanjuana Letters DO Internal Medicine Resident PGY-2 Pager [phone] Please contact the on call pager after 5 pm and on weekends at [phone].

## 2020-06-01 NOTE — Plan of Care (Signed)
  Problem: Education: Goal: Knowledge of risk factors and measures for prevention of condition will improve 06/01/2020 1624 by Cephus Shelling, RN Outcome: Progressing 06/01/2020 1622 by Cephus Shelling, RN Outcome: Progressing   Problem: Coping: Goal: Psychosocial and spiritual needs will be supported 06/01/2020 1624 by Cephus Shelling, RN Outcome: Progressing 06/01/2020 1622 by Cephus Shelling, RN Outcome: Progressing   Problem: Education: Goal: Knowledge of General Education information will improve Description: Including pain rating scale, medication(s)/side effects and non-pharmacologic comfort measures Outcome: Progressing   Problem: Activity: Goal: Risk for activity intolerance will decrease 06/01/2020 1624 by Cephus Shelling, RN Outcome: Progressing 06/01/2020 1622 by Cephus Shelling, RN Outcome: Progressing   Problem: Pain Managment: Goal: General experience of comfort will improve Outcome: Progressing   Problem: Safety: Goal: Ability to remain free from injury will improve Outcome: Progressing   Problem: Skin Integrity: Goal: Risk for impaired skin integrity will decrease Outcome: Progressing   Problem: Education: Goal: Knowledge of General Education information will improve Description: Including pain rating scale, medication(s)/side effects and non-pharmacologic comfort measures Outcome: Progressing

## 2020-06-01 NOTE — Progress Notes (Signed)
Pharmacy Antibiotic Note- follow up   Rowin Bayron is a 50 y.o. male admitted on 04/29/2020 with coccygeal osteomyelitis.  Pharmacy consulted for vancomycin and cefepime dosing - ID planning to treat x 6 weeks through 06/10/20. Patient was previously on MWF HD and has been transitioned back to TTS.  Tolerating HD sessions.  Pre-HD vancomycin level is therapeutic at 24 mcg/mL (goal 15-25 mcg/mL).  Afebrile, WBC WNL.  Plan: Vanc 1gm IV qHD TTS Cefepime 2 g IV qTTS Monitor HD tolerance, clinical progress, PRN vanc levels   Height: 6\' 6"  (198.1 cm) Weight: (!) 154.8 kg (341 lb 4.4 oz) IBW/kg (Calculated) : 91.4  Temp (24hrs), Avg:98 F (36.7 C), Min:97.7 F (36.5 C), Max:98.7 F (37.1 C)  Recent Labs  Lab 05/27/20 0152 05/28/20 0546 05/29/20 0213 05/29/20 1458 05/30/20 0649 05/31/20 0802 06/01/20 0757 06/01/20 0921  WBC 10.1 10.0 9.5  --  10.0  --  9.9  --   CREATININE 5.81* 4.02* 5.40*  --  3.85* 5.42* 6.47*  --   VANCOTROUGH  --   --   --  31*  --   --   --   --   VANCORANDOM  --   --   --  31  --   --   --  24    Estimated Creatinine Clearance: 22.8 mL/min (A) (by C-G formula based on SCr of 6.47 mg/dL (H)).    Allergies  Allergen Reactions  . Atorvastatin Hives    NOT on MAR    Vanc 12/9 >> (1/20) Cefepime 12/9 >> (1/20)  Remdesivir 12/9 >>12/14  12/20 pre-HD VL: 18 mcg/mL on 1g qMWF >> no change 12/30 pre HD VL: 23 mcg/mL on 1g qTTS >> no change  1/8 VR = 31 mcg/mL (drawn after HD and vanc dose >> inaccurate) 1/11 pre-HD VL = 24 mcg/mL >> no change  12/9 BCx: negative  12/9 COVID: Positive  Estee Yohe D. Mina Marble, PharmD, BCPS, [place] 06/01/2020, 12:19 PM

## 2020-06-01 NOTE — Progress Notes (Signed)
Abdominal Binder delivered to patient's room by ortho tech.

## 2020-06-01 NOTE — Progress Notes (Signed)
Inpatient Rehab Admissions Coordinator:    I have no beds available for this patient to admit to CIR today.  Will continue to follow for timing of potential admission pending bed availability.   Shann Medal, PT, DPT Admissions Coordinator [phone] 06/01/20  12:17 PM

## 2020-06-01 NOTE — Plan of Care (Signed)
  Problem: Respiratory: Goal: Will maintain a patent airway Outcome: Progressing   Problem: Clinical Measurements: Goal: Will remain free from infection Outcome: Progressing   Problem: Activity: Goal: Risk for activity intolerance will decrease Outcome: Progressing   Problem: Safety: Goal: Ability to remain free from injury will improve Outcome: Progressing

## 2020-06-01 NOTE — Progress Notes (Signed)
[facility] Progress Note   Subjective:   Patient seen and examined during dialysis.  Tolerating treatment well so far.  Reports improvement in diarrhea today.  Denies CP, SOB, abdominal pain and n/v.  Objective Vitals:   06/01/20 0525 06/01/20 0731 06/01/20 0735 06/01/20 0840  BP: (!) 115/96  (!) 155/101 (!) 131/93  Pulse: 92  91   Resp: 17  18   Temp: 97.8 F (36.6 C)  97.8 F (36.6 C)   TempSrc: Oral  Oral   SpO2: 99%  97%   Weight:  (!) 156.8 kg    Height:       Physical Exam General:chronically ill appearing male in NAD Heart:RRR, no mrg Lungs:CTAB anteriolaterally Abdomen:soft, NTND Extremities:no LE edema Dialysis Access: TDC in use   Filed Weights   05/31/20 0500 06/01/20 0500 06/01/20 0731  Weight: (!) 158.3 kg (!) 161 kg (!) 156.8 kg    Intake/Output Summary (Last 24 hours) at 06/01/2020 1016 Last data filed at 05/31/2020 1700 Gross per 24 hour  Intake 480 ml  Output 350 ml  Net 130 ml    Additional Objective Labs: Basic Metabolic Panel: Recent Labs  Lab 05/30/20 0649 05/31/20 0802 05/31/20 0814 06/01/20 0757  NA 133* 135  --  134*  K 4.2 4.5  --  4.3  CL 98 100  --  97*  CO2 24 25  --  25  GLUCOSE 122* 93  --  89  BUN 22* 31*  --  43*  CREATININE 3.85* 5.42*  --  6.47*  CALCIUM 8.9 9.5  --  9.3  PHOS  --   --  5.9* 6.6*   Liver Function Tests: Recent Labs  Lab 06/01/20 0757  ALBUMIN 1.8*   CBC: Recent Labs  Lab 05/27/20 0152 05/28/20 0546 05/29/20 0213 05/30/20 0649 06/01/20 0757  WBC 10.1 10.0 9.5 10.0 9.9  HGB 9.2* 9.5* 9.4* 9.5* 9.4*  HCT 29.3* 30.3* 31.6* 30.7* 30.3*  MCV 86.7 87.8 88.5 87.7 86.8  PLT 256 217 196 207 252   CBG: Recent Labs  Lab 05/31/20 0648 05/31/20 1124 05/31/20 1705 05/31/20 2019 06/01/20 0639  GLUCAP 100* 100* 168* 145* 90    Medications: . sodium chloride    . sodium chloride    . ceFEPime (MAXIPIME) IV 2 g (05/29/20 1023)  . vancomycin 1,000 mg (06/01/20 0945)   .  (feeding supplement) PROSource Plus  30 mL Oral TID BM  . Chlorhexidine Gluconate Cloth  6 each Topical Q0600  . Chlorhexidine Gluconate Cloth  6 each Topical Q0600  . Chlorhexidine Gluconate Cloth  6 each Topical Q0600  . darbepoetin (ARANESP) injection - DIALYSIS  200 mcg Intravenous Q Thu-HD  . famotidine  20 mg Oral QHS  . feeding supplement (NEPRO CARB STEADY)  237 mL Oral BID BM  . heparin injection (subcutaneous)  5,000 Units Subcutaneous Q8H  . heparin sodium (porcine)      . insulin aspart  0-5 Units Subcutaneous QHS  . insulin aspart  0-9 Units Subcutaneous TID WC  . insulin glargine  14 Units Subcutaneous QHS  . metoCLOPramide  5 mg Oral TID AC  . multivitamin  1 tablet Oral QHS  . sevelamer carbonate  800 mg Oral TID WC  . sodium chloride flush  3 mL Intravenous Q12H    Dialysis Orders: NWTTS 4h 400/800 143.5kg 3K/2.5Ca TDCHeparin3000units IV TIW - Mircera142mcgIVq2wks - last 12/8 - Venofer 100mg IVqHD x10 - completed 6/10 doses  Assessment/Plan: 1. Sacral decub/coccygealosteo: On Vanc/Cefepime  x 6 weeks (thru 06/10/20) - ID following.Continues hydrotherapy.Placement pending. Per inpatient rehab would benefit from CIR but limited by hypotension.  2. 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 3. COVID PNA- tested positive on 11/30, is off isolation now. 4. ESRD:on HD TTS. HD today per regular schedule. K 4.3.  5. BP/volume: BP mostly well controlled. Had issues with hypotension which improved once amlodipine and metoprolol d/c'd. Keep SBP >100 on HD. Continues to have orthostatic hypotension. Appears euvolemic on exam. UF as tolerated.  6. Anemiaof CKD- last Hgb9.4,cont Aranesp 266mcgq Thursday. 7. Secondary Hyperparathyroidism: CorrCaand Phos high.Not on VDRA.Using lowest available Ca bath with HD.Started Renvela 1/meals, if phos remains elevated will increase to 2AC TID.  8. Nutrition: last Alb very low, continue  Nepro + pro-source supplements. 9. DM T2 - per admit   Jen Mow, PA-C [place] 06/01/2020,10:16 AM  LOS: 33 days

## 2020-06-01 NOTE — Plan of Care (Signed)
  Problem: Coping: Goal: Psychosocial and spiritual needs will be supported Outcome: Progressing   Problem: Health Behavior/Discharge Planning: Goal: Ability to manage health-related needs will improve Outcome: Progressing   Problem: Clinical Measurements: Goal: Will remain free from infection Outcome: Progressing

## 2020-06-01 NOTE — Plan of Care (Signed)
  Problem: Education: Goal: Knowledge of risk factors and measures for prevention of condition will improve Outcome: Progressing   Problem: Coping: Goal: Psychosocial and spiritual needs will be supported Outcome: Progressing   Problem: Education: Goal: Knowledge of General Education information will improve Description: Including pain rating scale, medication(s)/side effects and non-pharmacologic comfort measures Outcome: Progressing   Problem: Activity: Goal: Risk for activity intolerance will decrease Outcome: Progressing   Problem: Safety: Goal: Ability to remain free from injury will improve Outcome: Progressing   Problem: Skin Integrity: Goal: Risk for impaired skin integrity will decrease Outcome: Progressing

## 2020-06-01 NOTE — Progress Notes (Signed)
01/11 0715 Pt taken to dialysis. Pt stable. Pt complained of no pain. Pt observed to be comfortable.

## 2020-06-02 ENCOUNTER — Inpatient Hospital Stay: Payer: Medicare Other | Admitting: Internal Medicine

## 2020-06-02 ENCOUNTER — Inpatient Hospital Stay (HOSPITAL_COMMUNITY): Payer: Medicare Other

## 2020-06-02 DIAGNOSIS — R159 Full incontinence of feces: Secondary | ICD-10-CM

## 2020-06-02 LAB — BASIC METABOLIC PANEL
Anion gap: 11 (ref 5–15)
BUN: 28 mg/dL — ABNORMAL HIGH (ref 6–20)
CO2: 28 mmol/L (ref 22–32)
Calcium: 8.5 mg/dL — ABNORMAL LOW (ref 8.9–10.3)
Chloride: 98 mmol/L (ref 98–111)
Creatinine, Ser: 4.83 mg/dL — ABNORMAL HIGH (ref 0.61–1.24)
GFR, Estimated: 14 mL/min — ABNORMAL LOW (ref >60)
Glucose, Bld: 154 mg/dL — ABNORMAL HIGH (ref 70–99)
Potassium: 4.2 mmol/L (ref 3.5–5.1)
Sodium: 137 mmol/L (ref 135–145)

## 2020-06-02 LAB — GLUCOSE, CAPILLARY
Glucose-Capillary: 110 mg/dL — ABNORMAL HIGH (ref 70–99)
Glucose-Capillary: 141 mg/dL — ABNORMAL HIGH (ref 70–99)
Glucose-Capillary: 99 mg/dL (ref 70–99)
Glucose-Capillary: 141 mg/dL — ABNORMAL HIGH (ref 70–99)

## 2020-06-02 LAB — PHOSPHORUS: Phosphorus: 5.4 mg/dL — ABNORMAL HIGH (ref 2.5–4.6)

## 2020-06-02 MED ORDER — HEPARIN SODIUM (PORCINE) 10000 UNIT/ML IJ SOLN
7500.0000 [IU] | Freq: Three times a day (TID) | INTRAMUSCULAR | Status: DC
  Administered 2020-06-02 – 2020-06-06 (×12): 7500 [IU] via SUBCUTANEOUS
  Filled 2020-06-02 (×15): qty 1

## 2020-06-02 MED ORDER — CHLORHEXIDINE GLUCONATE CLOTH 2 % EX PADS: 6.0000 | MEDICATED_PAD | Freq: Every day | CUTANEOUS | Status: DC

## 2020-06-02 NOTE — Progress Notes (Signed)
HD#34 Subjective:   No acute events overnight.  Sister on phone. No new complaints overnight. Continues to endorse being unable to know when or if he had a bowel movement until it has already passed. Reports he first noticed these episodes last September. He had been given numerous antibiotics in the past and does endorse a history of C.diff   No other complaints or concerns at the time of my examination   Objective:  Vital signs in last 24 hours: Vitals:   06/01/20 1157 06/01/20 1550 06/01/20 2004 06/02/20 0500  BP: (!) 151/103 123/87 (!) 124/91   Pulse: 99 100 (!) 101   Resp: 18 18 18    Temp: 97.7 F (36.5 C) 98.5 F (36.9 C) 98.6 F (37 C)   TempSrc: Oral Oral Oral   SpO2: 100% 100% 100%   Weight:    (!) 158.3 kg  Height:       Supplemental O2: Room Air SpO2: 100 % O2 Flow Rate (L/min): 25 L/min  Physical Exam Vitals and nursing note reviewed.  Constitutional:      General: He is not in acute distress.    Appearance: Normal appearance. He is not ill-appearing or toxic-appearing.  Cardiovascular:     Rate and Rhythm: Normal rate and regular rhythm.     Pulses: Normal pulses.     Heart sounds: Normal heart sounds. No murmur heard. No gallop.   Abdominal:     Palpations: Abdomen is soft.  Genitourinary:    Comments: Rectal tone absent.  Neurological:     Mental Status: He is alert and oriented to person, place, and time. Mental status is at baseline.     Comments: Patellar reflex 0-+1 bilaterally  Psychiatric:        Mood and Affect: Mood normal.        Behavior: Behavior normal.     Filed Weights   06/01/20 0731 06/01/20 1055 06/02/20 0500  Weight: (!) 156.8 kg (!) 154.8 kg (!) 158.3 kg     Intake/Output Summary (Last 24 hours) at 06/02/2020 0636 Last data filed at 06/01/2020 1900 Gross per 24 hour  Intake 480 ml  Output 2000 ml  Net -1520 ml   Net IO Since Admission: -23,530.45 mL [06/02/20 0636]  Pertinent Labs: CBC Latest Ref Rng & Units  06/01/2020 05/30/2020 05/29/2020  WBC 4.0 - 10.5 K/uL 9.9 10.0 9.5  Hemoglobin 13.0 - 17.0 g/dL 9.4(L) 9.5(L) 9.4(L)  Hematocrit 39.0 - 52.0 % 30.3(L) 30.7(L) 31.6(L)  Platelets 150 - 400 K/uL 252 207 196    CMP Latest Ref Rng & Units 06/02/2020 06/01/2020 05/31/2020  Glucose 70 - 99 mg/dL 154(H) 89 93  BUN 6 - 20 mg/dL 28(H) 43(H) 31(H)  Creatinine 0.61 - 1.24 mg/dL 4.83(H) 6.47(H) 5.42(H)  Sodium 135 - 145 mmol/L 137 134(L) 135  Potassium 3.5 - 5.1 mmol/L 4.2 4.3 4.5  Chloride 98 - 111 mmol/L 98 97(L) 100  CO2 22 - 32 mmol/L 28 25 25   Calcium 8.9 - 10.3 mg/dL 8.5(L) 9.3 9.5  Total Protein 6.5 - 8.1 g/dL - - -  Total Bilirubin 0.3 - 1.2 mg/dL - - -  Alkaline Phos 38 - 126 U/L - - -  AST 15 - 41 U/L - - -  ALT 0 - 44 U/L - - -    Imaging: No results found.  Assessment/Plan:   Principal Problem:   Sacral osteomyelitis (HCC) Active Problems:   Insulin dependent type 2 diabetes mellitus (HCC)   HTN (  hypertension)   History of anemia due to chronic kidney disease   Pressure injury of both heels, unstageable ([place])   Pneumonia due to COVID-19 virus   ESRD (end stage renal disease) ([place])   Avascular necrosis of first metatarsal, left foot (HCC)   Osteomyelitis of dorsal first metatarsal, left foot ([place])   Decubitus ulcer of sacral region, unstageable ([place])   Morbid obesity ([place])  Patient Summary: [name] is 50yo male with ESRD, hypertension, insulin-depending type II diabetes mellitus admitted 04/29/20 for sacral decubitus ulcer and COVID-19 pneumonia, continuing to progress well on IV antibiotics, awaiting CIR bed placement.   #Unstageable sacral decubitus ulcer #Coccygeal osteomyelitis Remains on long term antibiotics.Stable and awaiting CIR placement. 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 Sharol Given for progression of dry gangrene of left 4th toe. Per patient, Dr. Sharol Given would like him to follow up on outpatient basis. - follow up with ortho outpatient - C/w antibiotics - C/w wound care, Prevalon boots  #Diarrhea Diarrhea episodes have improved. He does endorse not knowing when he is passing his bowels. Rectal exam performed today, patient without rectal tone. Also assessed patellar reflexes which were 0-1+. Unclear etiology at this time, but believe imaging is warranted to rule out possible nerve damage. Patient had prior MRI pelvis performed which did not reveal an evidence of nerve compression. As such, reviewed prior MRI pelvis with radiologist who recommends MRI lumbar spine w/o contrast.  -MRI Lumbar w/o c ordered - if continues to have diarrhea, will consider stool panel/enteric precautions.   #Deconditioning #Generalized weakness PT continuing to work with patient. Patient with complaints on 06/02/19 - Continue feeding supplements for nutrition - Continue to move patient to chair twice daily - PT/OT - Pending CIR placement  #ESRD on HD TTS dialysis schedule.  - Nephrology following, appreciate their recommendations. - Daily BMP  #Hx of Hypertension #Orthostatic Patient continues to be orthostatic when working with PT. Discontinued home anti-hypertensives.  -Abdominal belt, SCD during PT sessions. Both items in patient's room - Continue to hold amlodipine and metoprolol. Restart once patient no longer hypotensive   Hx of Type II DM Glucose readings improved.  - SSI with meals - nightly lantus 14 U  Diet: Carb-Modified IVF: None,None VTE: Heparin Code: Full PT/OT recs: CIR  Dispo: Anticipated discharge to Skilled nursing facility pending CIR bed availability.  Sanjuana Letters DO Internal Medicine Resident PGY-2 Pager [phone] Please contact the on call pager after 5 pm and on weekends at [phone].

## 2020-06-02 NOTE — Progress Notes (Addendum)
[facility] Progress Note   Subjective:   Patient seen and examined at bedside.  No specific complaints.  Denies CP, SOB, n/v/d and abdominal pain.  Objective Vitals:   06/01/20 1550 06/01/20 2004 06/02/20 0500 06/02/20 0835  BP: 123/87 (!) 124/91  (!) 146/102  Pulse: 100 (!) 101  94  Resp: 18 18  16   Temp: 98.5 F (36.9 C) 98.6 F (37 C)  97.9 F (36.6 C)  TempSrc: Oral Oral  Oral  SpO2: 100% 100%  99%  Weight:   (!) 158.3 kg   Height:       Physical Exam General:chronically ill appearing male in NAD Heart:RRR, no mrg Lungs:CTAB Abdomen:soft, NTND Extremities:no LE edema, b/l heels wrapped Dialysis Access: TDC in use   Filed Weights   06/01/20 0731 06/01/20 1055 06/02/20 0500  Weight: (!) 156.8 kg (!) 154.8 kg (!) 158.3 kg    Intake/Output Summary (Last 24 hours) at 06/02/2020 1125 Last data filed at 06/01/2020 1900 Gross per 24 hour  Intake 480 ml  Output --  Net 480 ml    Additional Objective Labs: Basic Metabolic Panel: Recent Labs  Lab 05/31/20 0802 05/31/20 0814 06/01/20 0757 06/02/20 0200  NA 135  --  134* 137  K 4.5  --  4.3 4.2  CL 100  --  97* 98  CO2 25  --  25 28  GLUCOSE 93  --  89 154*  BUN 31*  --  43* 28*  CREATININE 5.42*  --  6.47* 4.83*  CALCIUM 9.5  --  9.3 8.5*  PHOS  --  5.9* 6.6*  --    Liver Function Tests: Recent Labs  Lab 06/01/20 0757  ALBUMIN 1.8*   CBC: Recent Labs  Lab 05/27/20 0152 05/28/20 0546 05/29/20 0213 05/30/20 0649 06/01/20 0757  WBC 10.1 10.0 9.5 10.0 9.9  HGB 9.2* 9.5* 9.4* 9.5* 9.4*  HCT 29.3* 30.3* 31.6* 30.7* 30.3*  MCV 86.7 87.8 88.5 87.7 86.8  PLT 256 217 196 207 252   CBG: Recent Labs  Lab 06/01/20 0639 06/01/20 1154 06/01/20 1633 06/01/20 2039 06/02/20 0650  GLUCAP 90 85 151* 155* 110*    Medications: . ceFEPime (MAXIPIME) IV 2 g (06/01/20 1805)  . vancomycin 1,000 mg (06/01/20 0945)   . (feeding supplement) PROSource Plus  30 mL Oral TID BM  . Chlorhexidine  Gluconate Cloth  6 each Topical Q0600  . Chlorhexidine Gluconate Cloth  6 each Topical Q0600  . Chlorhexidine Gluconate Cloth  6 each Topical Q0600  . darbepoetin (ARANESP) injection - DIALYSIS  200 mcg Intravenous Q Thu-HD  . famotidine  20 mg Oral QHS  . feeding supplement (NEPRO CARB STEADY)  237 mL Oral BID BM  . heparin injection (subcutaneous)  7,500 Units Subcutaneous Q8H  . insulin aspart  0-5 Units Subcutaneous QHS  . insulin aspart  0-9 Units Subcutaneous TID WC  . insulin glargine  14 Units Subcutaneous QHS  . metoCLOPramide  5 mg Oral TID AC  . multivitamin  1 tablet Oral QHS  . sevelamer carbonate  800 mg Oral TID WC  . sodium chloride flush  3 mL Intravenous Q12H    Dialysis Orders: NWTTS 4h 400/800 143.5kg 3K/2.5Ca TDCHeparin3000units IV TIW - Mircera139mcgIVq2wks - last 12/8 - Venofer 100mg IVqHD x10 - completed 6/10 doses  Assessment/Plan: 1. Sacral decub/coccygealosteo: On Vanc/Cefepime x 6 weeks (thru 06/10/20) - ID following.Continues hydrotherapy.Placement pending. To get admitted to CIR. 2. Osteomyelitis L foot/ pressure injury bilat heels: Wound care +  abx. 3. COVID PNA- tested positive on 11/30, is off isolation now. 4. ESRD: on HD TTS. HD today per regular schedule. K 4.2.  5. BP/volume:BP mostly well controlled. Had issues with hypotension which improved once amlodipine and metoprolol d/c'd. Keep SBP >100 on HD.Continues to have orthostatic hypotension. Appears euvolemic on exam. UF as tolerated.  6. Anemiaof CKD- last Hgb9.4,cont Aranesp 259mcgq Thursday. 7. Secondary Hyperparathyroidism: CorrCaand Phos high.Not on VDRA.Using lowest available Ca bath with HD.Started Renvela 1/meals, checking phos again today if remains elevated will increase to 2AC TID.  8. Nutrition: last Alb very low, continue Nepro + pro-source supplements.  On carb modified diet. Follow labs.  9. DM T2 - per admit   Jen Mow,  PA-C [facility] 06/02/2020,11:25 AM  LOS: 34 days   Pt seen, examined and agree w assess/plan as above with additions as indicated. PT is concerned about orthostatic symptoms. Will hold UF for now and check standing Bp's , consider giving him some fluid back if they are dropping.  [facility] 06/02/2020, 2:57 PM

## 2020-06-02 NOTE — Progress Notes (Signed)
Nutrition Follow-up  DOCUMENTATION CODES:   Morbid obesity  INTERVENTION:   -Continue renal MVI daily -Continue 30 ml Prosource Plus TID, each supplement provides 100 kcals and 15 grams protein -ContinueNepro Shake poBID,each supplement provides 425 kcal and 19 grams protein -Continue double protein portions with each meal   NUTRITION DIAGNOSIS:   Increased nutrient needs related to wound healing as evidenced by estimated needs.  Ongoing   GOAL:   Patient will meet greater than or equal to 90% of their needs  Ongoing   MONITOR:   PO intake,Supplement acceptance,Labs,Weight trends,Skin,I & O's  REASON FOR ASSESSMENT:   LOS    ASSESSMENT:   50 y/o male with  history of HTN, DM on insulin, ESRD, Anemia who presents from [facility] because the staff stated that he wasnt eating the way he usually does with recent diagnosis of COVID on 03/24/2020 admitted for COVID pneumonia.  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 Pt currently waiting on CIR placement.  Spoke with pt at bedside who reports his appetite has been varied. Pt reports he had experienced recent episodes of diarrhea over the last few days which cause him to not want to eat. Pt reports no diarrhea today.   Pt reports that when he is not experiencing diarrhea, he is able to eat 100% of meals. Per chart review, pt consuming 100% of documented meals. Pt reports drinking Nepro but not everyday. Per MAR review, pt is refusing one Nepro per day. Pt reports taking Prosource Plus daily. He stated that he has experienced some confusion about frequency of Prosource supplement. Pt reports he has been told to take it once per day and instructed at other times to take TID. Two Prosource supplements noted at bedside unopened. Explained the importance of protein in wound healing and reiterated the importance of taking oral supplements as ordered. Pt was ordering lunch at end of RD visit.   Weight has  been stable since admission. Per nephrology, EDW is 143.5 kg.   I/O's since admission: Net negative 8 L  Labs reviewed: CBG's x 24 hours: 110-155, Phosphorus 5.4  Medications reviewed and include: Aranesp, Pepcid, Novolog SSI, Rena-vit, Renvela    Diet Order:   Diet Order            Diet Carb Modified Fluid consistency: Thin; Room service appropriate? Yes; Fluid restriction: 1200 mL Fluid  Diet effective now                 EDUCATION NEEDS:   No education needs have been identified at this time  Skin:  Skin Assessment: Skin Integrity Issues: Skin Integrity Issues:: Stage IV,Unstageable Stage IV: buttocks Unstageable: lt lateral ankle, lt posterior foot, rt medial foot, rt heel  Last BM:  06/01/20-type 7  Height:   Ht Readings from Last 1 Encounters:  04/29/20 6\' 6"  (1.981 m)    Weight:   Wt Readings from Last 1 Encounters:  06/02/20 (!) 158.3 kg    Ideal Body Weight:  97.3 kg  BMI:  Body mass index is 40.33 kg/m.  Estimated Nutritional Needs:   Kcal:  2202-5427  Protein:  165-190 grams  Fluid:  1.2 L   Ronnald Nian, Dietetic Intern Pager: [phone] If unavailable: [phone]

## 2020-06-02 NOTE — Plan of Care (Signed)
  Problem: Safety: Goal: Ability to remain free from injury will improve Outcome: Progressing   Problem: Skin Integrity: Goal: Risk for impaired skin integrity will decrease Outcome: Progressing   

## 2020-06-02 NOTE — Progress Notes (Signed)
BMI 40 - ok to increase SQ heparin to 7500units SQ q8 per Dr, Beryle Beams.  Onnie Boer, PharmD, BCIDP, AAHIVP, CPP Infectious Disease Pharmacist 06/02/2020 9:14 AM

## 2020-06-02 NOTE — Progress Notes (Signed)
Physical Therapy Treatment Patient Details Name: Fred [name] MRN: [id number] DOB: April 18, 1971 Today's Date: 06/02/2020    History of Present Illness Pt is a 49 y.o. male admitted from [facility] on 04/29/20 for evaluation of low oxygen saturations 82% on room air; concern for sepsis form Covid PNA, sacral wound and foot wounds. 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 Of note, pt recently admitted to [facility] due to gallbladder problems then d/c to [facility](14 days) for rehab then readmitted to [facility] then d/c to [facility] for rehab. PMH includes ESRD on HD (started 9/21), HTN, DM, C. diff.    PT Comments    Pt supine on arrival, agreeable to therapy session and with good participation and tolerance for mobility. Pt limited due to continued orthostatic hypotension, even with BLE ace wrapped ankles up to upper thighs and abdominal binder donned prior to EOB transition. Pt able to tolerate standing ~1 minute prior to sitting on Stedy seat, however unable to tolerate upright in Stedy posture and needed to stand x2 more attempts for removal of Stedy seat flaps, then positioned quickly from sitting to supine due to symptoms. Pt performed seated BUE/BLE therapeutic exercises with good tolerance. Reviewed supine exercises for pt to perform TID as able.  Orthostatic BPs Supine 171/91 (109)  Sitting 153/118 (130)  Sitting after 3 min 164/108 (125)  Standing 102/58 (73)  Supine 144/94 (111)  Pt encouraged to place bed in chair position on/off throughout day for 30-45 mins at a time as well as use of SCDs. Pt may benefit from being placed in chair position in bed 30 mins prior to next PT/OT session for improved orthostatics. Pt continues to benefit from PT services to progress toward functional mobility goals. D/C recs below remain appropriate.    Follow Up Recommendations  CIR;Supervision for mobility/OOB     Equipment  Recommendations  Wheelchair (measurements PT);Wheelchair cushion (measurements PT);Hospital bed;Other (comment)    Recommendations for Other Services       Precautions / Restrictions Precautions Precautions: Fall Precaution Comments: wounds on bilat feet and L sacrum, monitor orthostatic BPs Required Braces or Orthoses: Other Brace Other Brace: Ace wrap in room to be donned to BLE and abdominal binder prior to EOB, B prevalon boots in room Restrictions Weight Bearing Restrictions: No    Mobility  Bed Mobility Overal bed mobility: Needs Assistance Bed Mobility: Rolling;Sidelying to Sit;Sit to Sidelying Rolling: Modified independent (Device/Increase time) Sidelying to sit: Supervision     Sit to sidelying: Min guard General bed mobility comments: pt able to perform log roll with supervision, min guard needed to return to supine safely due to sxs dizziness after standing  Transfers Overall transfer level: Needs assistance   Transfers: Sit to/from Stand Sit to Stand: Max assist;+2 physical assistance;From elevated surface         General transfer comment: STS x4 attempts (x2 attempts from EOB height to [place] and successful on second attempt and x2 from [facility] seat to standing)  Ambulation/Gait                 Stairs             Wheelchair Mobility    Modified Rankin (Stroke Patients Only)       Balance Overall balance assessment: Needs assistance Sitting-balance support: Feet supported;Single extremity supported Sitting balance-Leahy Scale: Fair Sitting balance - Comments: able to sit EOB with close supervision   Standing balance support: Bilateral upper extremity supported  Standing balance-Leahy Scale: Poor Standing balance comment: reliant on Stedy rail and +70maxA for upright posture, able to stand with U UE support and do LUE arm exercises with maxA for improved orthostatics                            Cognition Arousal/Alertness:  Awake/alert Behavior During Therapy: WFL for tasks assessed/performed;Flat affect Overall Cognitive Status: No family/caregiver present to determine baseline cognitive functioning Area of Impairment: Attention;Following commands                   Current Attention Level: Selective   Following Commands: Follows one step commands consistently;Follows one step commands with increased time Safety/Judgement: Decreased awareness of safety;Decreased awareness of deficits Awareness: Emergent Problem Solving: Slow processing;Requires verbal cues General Comments: Pt pleasant and motivated to complete all tasks, increased processing times and at times poor insight into functional deficits      Exercises Other Exercises Other Exercises: seated rowing exercise BUE with resistance provided by therapist for BUE strengthening and improved hemodynamics x30 seconds, seated BLE knee extension x10 reps Supine BLE AROM: ankle pumps, glute sets, quad sets 1x10 reps ea   General Comments General comments (skin integrity, edema, etc.): see orthostatic BP in assessment above; RN Tu and MD Charlton Haws notified of symptomatic orthostatic hypotension in standing. Encouraged bed in chair position for 1 hour at a time during day on/off and SCDs donned on/off throughout day      Pertinent Vitals/Pain Pain Assessment: No/denies pain Faces Pain Scale: No hurt Pain Intervention(s): Monitored during session;Repositioned    Home Living                      Prior Function            PT Goals (current goals can now be found in the care plan section) Acute Rehab PT Goals Patient Stated Goal: to get better and not be dizzy PT Goal Formulation: With patient Time For Goal Achievement: 06/09/20 Potential to Achieve Goals: Fair Progress towards PT goals: Progressing toward goals    Frequency    Min 3X/week      PT Plan Current plan remains appropriate    Co-evaluation PT/OT/SLP  Co-Evaluation/Treatment: Yes            AM-PAC PT "6 Clicks" Mobility   Outcome Measure  Help needed turning from your back to your side while in a flat bed without using bedrails?: None Help needed moving from lying on your back to sitting on the side of a flat bed without using bedrails?: A Little Help needed moving to and from a bed to a chair (including a wheelchair)?: A Lot Help needed standing up from a chair using your arms (e.g., wheelchair or bedside chair)?: A Lot Help needed to walk in hospital room?: Total Help needed climbing 3-5 steps with a railing? : Total 6 Click Score: 13    End of Session Equipment Utilized During Treatment: Gait belt (BLE ace wrapped ankles to upper thighs and abdominal binder donned prior to EOB/standing) Activity Tolerance: Patient tolerated treatment well Patient left: in bed;with call bell/phone within reach;Other (comment);with SCD's reapplied (bed in chair position) Nurse Communication: Mobility status;Other (comment) (orthostatic hypotension VS) PT Visit Diagnosis: Muscle weakness (generalized) (M62.81);Difficulty in walking, not elsewhere classified (R26.2)     Time: 5638-7564 PT Time Calculation (min) (ACUTE ONLY): 47 min  Charges:  $Therapeutic Exercise: 8-22 mins $Therapeutic Activity: 23-37  mins                     Houston Siren., PTA Acute Rehabilitation Services Pager: [phone] Office: [place] 06/02/2020, 5:13 PM

## 2020-06-03 LAB — CBC
HCT: 30.4 % — ABNORMAL LOW (ref 39.0–52.0)
Hemoglobin: 9.5 g/dL — ABNORMAL LOW (ref 13.0–17.0)
MCH: 26.9 pg (ref 26.0–34.0)
MCHC: 31.3 g/dL (ref 30.0–36.0)
MCV: 86.1 fL (ref 80.0–100.0)
Platelets: 269 10*3/uL (ref 150–400)
RBC: 3.53 MIL/uL — ABNORMAL LOW (ref 4.22–5.81)
RDW: 17.3 % — ABNORMAL HIGH (ref 11.5–15.5)
WBC: 10.4 10*3/uL (ref 4.0–10.5)
nRBC: 0 % (ref 0.0–0.2)

## 2020-06-03 LAB — BASIC METABOLIC PANEL
Anion gap: 13 (ref 5–15)
BUN: 39 mg/dL — ABNORMAL HIGH (ref 6–20)
CO2: 24 mmol/L (ref 22–32)
Calcium: 9 mg/dL (ref 8.9–10.3)
Chloride: 99 mmol/L (ref 98–111)
Creatinine, Ser: 6.06 mg/dL — ABNORMAL HIGH (ref 0.61–1.24)
GFR, Estimated: 11 mL/min — ABNORMAL LOW (ref >60)
Glucose, Bld: 95 mg/dL (ref 70–99)
Potassium: 4.2 mmol/L (ref 3.5–5.1)
Sodium: 136 mmol/L (ref 135–145)

## 2020-06-03 LAB — GLUCOSE, CAPILLARY
Glucose-Capillary: 93 mg/dL (ref 70–99)
Glucose-Capillary: 82 mg/dL (ref 70–99)
Glucose-Capillary: 75 mg/dL (ref 70–99)
Glucose-Capillary: 98 mg/dL (ref 70–99)

## 2020-06-03 MED ORDER — LANTHANUM CARBONATE 500 MG PO CHEW
500.0000 mg | CHEWABLE_TABLET | Freq: Three times a day (TID) | ORAL | Status: DC
  Administered 2020-06-03: 500 mg via ORAL
  Filled 2020-06-03 (×3): qty 1

## 2020-06-03 MED ORDER — LOPERAMIDE HCL 2 MG PO CAPS
2.0000 mg | ORAL_CAPSULE | ORAL | Status: DC | PRN
  Administered 2020-06-03 – 2020-06-04 (×2): 2 mg via ORAL
  Filled 2020-06-03 (×2): qty 1

## 2020-06-03 NOTE — Progress Notes (Signed)
OT Cancellation Note  Patient Details Name: Fred [name] MRN: [id number] DOB: Feb 28, 1971   Cancelled Treatment:    Reason Eval/Treat Not Completed: Fatigue/lethargy limiting ability to participate;Other (comment). Pt reports overall just not feeling well today with more diarrhea and that he is just "sick and tired of being sick and frustrated". OT will follow up next available time  Britt Bottom 06/03/2020, 3:04 PM

## 2020-06-03 NOTE — Plan of Care (Signed)
  Problem: Safety: Goal: Ability to remain free from injury will improve Outcome: Progressing   Problem: Skin Integrity: Goal: Risk for impaired skin integrity will decrease Outcome: Progressing   Problem: Coping: Goal: Level of anxiety will decrease Outcome: Progressing   

## 2020-06-03 NOTE — Progress Notes (Signed)
Inpatient Rehab Admissions Coordinator:   I have no beds available for this patient to admit to CIR today.  Will continue to follow for timing of potential admission pending bed availability.   Shann Medal, PT, DPT Admissions Coordinator [phone] 06/03/20  10:00 AM

## 2020-06-03 NOTE — Progress Notes (Signed)
HD#35 Subjective:   No acute events overnight.  Patient reports he is having diarrhea again, 4 episodes of watery diarrhea since waking up this morning. No fevers, chills, or leukocytosis. Discussed results of MRI and possibility of having less fluid pulled off during HD.   Objective:  Vital signs in last 24 hours: Vitals:   06/02/20 0835 06/02/20 1534 06/02/20 1945 06/03/20 0426  BP: (!) 146/102 (!) 159/86 (!) 141/98 137/84  Pulse: 94 96 94 98  Resp: 16 17 17 16   Temp: 97.9 F (36.6 C) 98 F (36.7 C) 99.1 F (37.3 C) 98.1 F (36.7 C)  TempSrc: Oral Oral Oral Oral  SpO2: 99% 100% 98% 99%  Weight:      Height:       Supplemental O2: Room Air SpO2: 99 % O2 Flow Rate (L/min): 25 L/min  Physical Exam Vitals and nursing note reviewed.  Constitutional:      General: He is not in acute distress.    Appearance: Normal appearance. He is not ill-appearing or toxic-appearing.  Cardiovascular:     Rate and Rhythm: Normal rate and regular rhythm.     Pulses: Normal pulses.     Heart sounds: Normal heart sounds. No murmur heard. No gallop.   Pulmonary:     Effort: Pulmonary effort is normal. No respiratory distress.     Breath sounds: Normal breath sounds. No stridor. No wheezing, rhonchi or rales.  Abdominal:     Palpations: Abdomen is soft.  Musculoskeletal:     Cervical back: Neck supple.  Skin:    General: Skin is warm and dry.  Neurological:     Mental Status: He is alert and oriented to person, place, and time. Mental status is at baseline.  Psychiatric:        Mood and Affect: Mood normal.        Behavior: Behavior normal.     Filed Weights   06/01/20 0731 06/01/20 1055 06/02/20 0500  Weight: (!) 156.8 kg (!) 154.8 kg (!) 158.3 kg     Intake/Output Summary (Last 24 hours) at 06/03/2020 0645 Last data filed at 06/02/2020 1700 Gross per 24 hour  Intake 240 ml  Output --  Net 240 ml   Net IO Since Admission: -23,290.45 mL [06/03/20 0645]  Pertinent  Labs: CBC Latest Ref Rng & Units 06/03/2020 06/01/2020 05/30/2020  WBC 4.0 - 10.5 K/uL 10.4 9.9 10.0  Hemoglobin 13.0 - 17.0 g/dL 9.5(L) 9.4(L) 9.5(L)  Hematocrit 39.0 - 52.0 % 30.4(L) 30.3(L) 30.7(L)  Platelets 150 - 400 K/uL 269 252 207    CMP Latest Ref Rng & Units 06/03/2020 06/02/2020 06/01/2020  Glucose 70 - 99 mg/dL 95 154(H) 89  BUN 6 - 20 mg/dL 39(H) 28(H) 43(H)  Creatinine 0.61 - 1.24 mg/dL 6.06(H) 4.83(H) 6.47(H)  Sodium 135 - 145 mmol/L 136 137 134(L)  Potassium 3.5 - 5.1 mmol/L 4.2 4.2 4.3  Chloride 98 - 111 mmol/L 99 98 97(L)  CO2 22 - 32 mmol/L 24 28 25   Calcium 8.9 - 10.3 mg/dL 9.0 8.5(L) 9.3  Total Protein 6.5 - 8.1 g/dL - - -  Total Bilirubin 0.3 - 1.2 mg/dL - - -  Alkaline Phos 38 - 126 U/L - - -  AST 15 - 41 U/L - - -  ALT 0 - 44 U/L - - -    Imaging: MR LUMBAR SPINE WO CONTRAST  Result Date: 06/02/2020 CLINICAL DATA:  Lower extremity weakness and poor rectal tone. EXAM: MRI LUMBAR SPINE  WITHOUT CONTRAST TECHNIQUE: Multiplanar, multisequence MR imaging of the lumbar spine was performed. No intravenous contrast was administered. COMPARISON:  None. FINDINGS: Segmentation: Transitional lumbosacral anatomy with rudimentary L5-S1 disc space and right sided L5-S1 assimilation joint. Alignment:  Normal Vertebrae:  No fracture, evidence of discitis, or bone lesion. Conus medullaris and cauda equina: Conus extends to the L1 level. Conus and cauda equina appear normal. Paraspinal and other soft tissues: Negative Disc levels: L1-L2: Normal disc space and facet joints. No spinal canal stenosis. No neural foraminal stenosis. L2-L3: Large left asymmetric disc bulge. Moderate-to-severe spinal canal stenosis. Stenosis is greatest in the left lateral recess. There is no neural foraminal stenosis. L3-L4: Moderate facet hypertrophy with small disc bulge. No spinal canal stenosis. No neural foraminal stenosis. L4-L5: Mild facet hypertrophy. Normal disc. No spinal canal stenosis. No neural  foraminal stenosis. L5-S1: Small disc space. No spinal canal stenosis. No neural foraminal stenosis. Visualized sacrum: Normal. IMPRESSION: 1. Large left asymmetric disc bulge at L2-L3 with moderate-to-severe spinal canal stenosis, greatest in the left lateral recess. 2. Moderate facet arthrosis at L3-4 and L4-5. 3. Transitional lumbosacral anatomy with rudimentary L5-S1 disc space and right sided L5-S1 assimilation joint. Electronically Signed   By: Ulyses Jarred M.D.   On: 06/02/2020 22:44    Assessment/Plan:   Principal Problem:   Sacral osteomyelitis (HCC) Active Problems:   Insulin dependent type 2 diabetes mellitus (HCC)   HTN (hypertension)   History of anemia due to chronic kidney disease   Pressure injury of both heels, unstageable ([place])   Pneumonia due to COVID-19 virus   ESRD (end stage renal disease) ([place])   Avascular necrosis of first metatarsal, left foot (HCC)   Osteomyelitis of dorsal first metatarsal, left foot ([place])   Decubitus ulcer of sacral region, unstageable ([name])   Morbid obesity ([place])  Patient Summary: Fred [name] is 50yo male with ESRD, hypertension, insulin-depending type II diabetes mellitus admitted 04/29/20 for sacral decubitus ulcer and COVID-19 pneumonia, continuing to progress well on IV antibiotics, awaiting CIR bed placement.   #Unstageable sacral decubitus ulcer #Coccygeal osteomyelitis Remains on long term antibiotics.Stable and awaiting CIR placement. 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 Sharol Given for progression of dry gangrene of left 4th toe. Per patient, Dr. Sharol Given would like him to  follow up on outpatient basis. - follow up with ortho outpatient - C/w antibiotics - C/w wound care, Prevalon boots  #Hx of Hypertension #Orthostatic Patient continues to be orthostatic when working with PT despite abdominal belt/SCD's. Suspect persistent orthostasis in setting of neurological dysfunction secondary to severe diabetic neuropathy. Nephro plan to keep patient even. Suspect moving forward, in order to keep patient's pressures stable while ambulating, he will need to be hypertensive at baseline. Discontinued home anti-hypertensives.  -nephro to keep patient net even tomorrow during dialysis -Abdominal belt, SCD during PT sessions. Both items in patient's room - Continue to hold amlodipine and metoprolol. Restart once patient no longer hypotensive   #Diarrhea Patient notes 4 episodes of diarrhea this am that were watery in consistency. We will start imodium. Reached out to nephro to discuss possibility of renvela contributing to diarrhea episodes.  -  start imodium -Pending nephro recs for renvela - if continues to have diarrhea, will consider stool panel/enteric precautions.   #Deconditioning #Generalized weakness PT continuing to work with patient. Patient with complaints on 06/02/19 - Continue feeding supplements for nutrition - Continue to move patient to chair twice daily - PT/OT - Pending CIR placement  #ESRD on HD TTS dialysis schedule.  - Nephrology following, appreciate their recommendations. - Daily BMP  Hx of Type II DM Glucose readings improved.  - SSI with meals - nightly lantus 14 U  Diet: Carb-Modified IVF: None,None VTE: Heparin Code: Full PT/OT recs: CIR  Dispo: Anticipated discharge to Skilled nursing facility pending CIR bed availability.  Sanjuana Letters DO Internal Medicine Resident PGY-2 Pager [phone] Please contact the on call pager after 5 pm and on weekends at [phone].

## 2020-06-03 NOTE — Progress Notes (Signed)
[facility] Progress Note   Subjective:   Patient seen and examined at bedside.  Reports worsened diarrhea today and overall feeling "bad".  States "Im sick of being sick". Denies CP, SOB and n/v.    Objective Vitals:   06/02/20 1534 06/02/20 1945 06/03/20 0426 06/03/20 0848  BP: (!) 159/86 (!) 141/98 137/84 (!) 166/111  Pulse: 96 94 98 90  Resp: 17 17 16 18   Temp: 98 F (36.7 C) 99.1 F (37.3 C) 98.1 F (36.7 C) 97.7 F (36.5 C)  TempSrc: Oral Oral Oral Oral  SpO2: 100% 98% 99% 100%  Weight:      Height:       Physical Exam General:chronically ill appearing male in NAD Heart:RRR, no mrg Lungs:CTAB, nml WOB Abdomen:soft, +tenderness LLQ, +BS Extremities:no LE edema b/l, b/l heels wrapped Dialysis Access: [facility]   Filed Weights   06/01/20 0731 06/01/20 1055 06/02/20 0500  Weight: (!) 156.8 kg (!) 154.8 kg (!) 158.3 kg    Intake/Output Summary (Last 24 hours) at 06/03/2020 1133 Last data filed at 06/03/2020 0900 Gross per 24 hour  Intake 600 ml  Output --  Net 600 ml    Additional Objective Labs: Basic Metabolic Panel: Recent Labs  Lab 05/31/20 0814 06/01/20 0757 06/02/20 0200 06/03/20 0236  NA  --  134* 137 136  K  --  4.3 4.2 4.2  CL  --  97* 98 99  CO2  --  25 28 24   GLUCOSE  --  89 154* 95  BUN  --  43* 28* 39*  CREATININE  --  6.47* 4.83* 6.06*  CALCIUM  --  9.3 8.5* 9.0  PHOS 5.9* 6.6* 5.4*  --    Liver Function Tests: Recent Labs  Lab 06/01/20 0757  ALBUMIN 1.8*   CBC: Recent Labs  Lab 05/28/20 0546 05/29/20 0213 05/30/20 0649 06/01/20 0757 06/03/20 0236  WBC 10.0 9.5 10.0 9.9 10.4  HGB 9.5* 9.4* 9.5* 9.4* 9.5*  HCT 30.3* 31.6* 30.7* 30.3* 30.4*  MCV 87.8 88.5 87.7 86.8 86.1  PLT 217 196 207 252 269   CBG: Recent Labs  Lab 06/02/20 0650 06/02/20 1147 06/02/20 1608 06/02/20 2007 06/03/20 0620  GLUCAP 110* 141* 99 141* 93   Iron Studies: No results for input(s): IRON, TIBC, TRANSFERRIN, FERRITIN in the last 72  hours. Lab Results  Component Value Date   INR 1.2 04/29/2020   Studies/Results: MR LUMBAR SPINE WO CONTRAST  Result Date: 06/02/2020 CLINICAL DATA:  Lower extremity weakness and poor rectal tone. EXAM: MRI LUMBAR SPINE WITHOUT CONTRAST TECHNIQUE: Multiplanar, multisequence MR imaging of the lumbar spine was performed. No intravenous contrast was administered. COMPARISON:  None. FINDINGS: Segmentation: Transitional lumbosacral anatomy with rudimentary L5-S1 disc space and right sided L5-S1 assimilation joint. Alignment:  Normal Vertebrae:  No fracture, evidence of discitis, or bone lesion. Conus medullaris and cauda equina: Conus extends to the L1 level. Conus and cauda equina appear normal. Paraspinal and other soft tissues: Negative Disc levels: L1-L2: Normal disc space and facet joints. No spinal canal stenosis. No neural foraminal stenosis. L2-L3: Large left asymmetric disc bulge. Moderate-to-severe spinal canal stenosis. Stenosis is greatest in the left lateral recess. There is no neural foraminal stenosis. L3-L4: Moderate facet hypertrophy with small disc bulge. No spinal canal stenosis. No neural foraminal stenosis. L4-L5: Mild facet hypertrophy. Normal disc. No spinal canal stenosis. No neural foraminal stenosis. L5-S1: Small disc space. No spinal canal stenosis. No neural foraminal stenosis. Visualized sacrum: Normal. IMPRESSION: 1. Large  left asymmetric disc bulge at L2-L3 with moderate-to-severe spinal canal stenosis, greatest in the left lateral recess. 2. Moderate facet arthrosis at L3-4 and L4-5. 3. Transitional lumbosacral anatomy with rudimentary L5-S1 disc space and right sided L5-S1 assimilation joint. Electronically Signed   By: Ulyses Jarred M.D.   On: 06/02/2020 22:44    Medications: . ceFEPime (MAXIPIME) IV 2 g (06/01/20 1805)  . vancomycin 1,000 mg (06/01/20 0945)   . (feeding supplement) PROSource Plus  30 mL Oral TID BM  . Chlorhexidine Gluconate Cloth  6 each Topical Q0600   . darbepoetin (ARANESP) injection - DIALYSIS  200 mcg Intravenous Q Thu-HD  . famotidine  20 mg Oral QHS  . feeding supplement (NEPRO CARB STEADY)  237 mL Oral BID BM  . heparin injection (subcutaneous)  7,500 Units Subcutaneous Q8H  . insulin aspart  0-5 Units Subcutaneous QHS  . insulin aspart  0-9 Units Subcutaneous TID WC  . insulin glargine  14 Units Subcutaneous QHS  . metoCLOPramide  5 mg Oral TID AC  . multivitamin  1 tablet Oral QHS  . sevelamer carbonate  800 mg Oral TID WC  . sodium chloride flush  3 mL Intravenous Q12H    Dialysis Orders: NWTTS 4h 400/800 143.5kg 3K/2.5Ca TDCHeparin3000units IV TIW - Mircera168mcgIVq2wks - last 12/8 - Venofer 100mg IVqHD x10 - completed 6/10 doses  Assessment/Plan: 1. Sacral decub/coccygealosteo: On Vanc/Cefepime x 6 weeks (thru 06/10/20) - ID following.Continues hydrotherapy.Placement pending.May be getting admitted to CIR - no current beds available. 2. 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 3. COVID PNA- tested positive on 11/30, is off isolation now. 4. ESRD: on HD TTS.HD today per regular schedule.K 4.2.  5. BP/volume:BP mostly well controlled. Had issues with hypotension which improved once amlodipine and metoprolol d/c'd. Keep SBP >100 on HD.Continues to have orthostatic hypotension.Appears euvolemic on exam. +diarrhea, will run even with HD today, let volume come up a little. 6. Anemiaof CKD- last Hgb9.5,cont Aranesp 258mcgq Thursday. 7. Secondary Hyperparathyroidism: [place]igh, phos in goal.Not on VDRA.Using lowest available Ca bath with HD.Continue Renvela 1/meals. 8. Nutrition: last Alb very low, continue Nepro + pro-source supplements.  On carb modified diet. Follow labs.  9. DM T2 - per admit   Jen Mow, PA-C [facility] 06/03/2020,11:33 AM  LOS: 35 days

## 2020-06-04 LAB — BASIC METABOLIC PANEL
Anion gap: 16 — ABNORMAL HIGH (ref 5–15)
BUN: 48 mg/dL — ABNORMAL HIGH (ref 6–20)
CO2: 23 mmol/L (ref 22–32)
Calcium: 9 mg/dL (ref 8.9–10.3)
Chloride: 96 mmol/L — ABNORMAL LOW (ref 98–111)
Creatinine, Ser: 7.64 mg/dL — ABNORMAL HIGH (ref 0.61–1.24)
GFR, Estimated: 8 mL/min — ABNORMAL LOW (ref >60)
Glucose, Bld: 69 mg/dL — ABNORMAL LOW (ref 70–99)
Potassium: 4.6 mmol/L (ref 3.5–5.1)
Sodium: 135 mmol/L (ref 135–145)

## 2020-06-04 LAB — GLUCOSE, CAPILLARY
Glucose-Capillary: 90 mg/dL (ref 70–99)
Glucose-Capillary: 79 mg/dL (ref 70–99)
Glucose-Capillary: 128 mg/dL — ABNORMAL HIGH (ref 70–99)
Glucose-Capillary: 111 mg/dL — ABNORMAL HIGH (ref 70–99)

## 2020-06-04 MED ORDER — HEPARIN SODIUM (PORCINE) 1000 UNIT/ML IJ SOLN
INTRAMUSCULAR | Status: AC
  Filled 2020-06-04: qty 1

## 2020-06-04 MED ORDER — VANCOMYCIN HCL IN DEXTROSE 1-5 GM/200ML-% IV SOLN
INTRAVENOUS | Status: AC
  Administered 2020-06-04: 1000 mg via INTRAVENOUS
  Filled 2020-06-04: qty 200

## 2020-06-04 MED ORDER — SODIUM CHLORIDE 0.9 % IV SOLN
2.0000 g | Freq: Once | INTRAVENOUS | Status: DC
  Filled 2020-06-04 (×2): qty 2

## 2020-06-04 MED ORDER — VANCOMYCIN HCL IN DEXTROSE 1-5 GM/200ML-% IV SOLN: 1000.0000 mg | INTRAVENOUS | Status: AC

## 2020-06-04 MED ORDER — LOPERAMIDE HCL 2 MG PO CAPS
2.0000 mg | ORAL_CAPSULE | ORAL | Status: DC
  Administered 2020-06-04 – 2020-06-06 (×13): 2 mg via ORAL
  Filled 2020-06-04 (×13): qty 1

## 2020-06-04 NOTE — Progress Notes (Signed)
Inpatient Rehab Admissions Coordinator:   I have no beds available for this patient to admit to CIR today.  Will continue to follow for timing of potential admission pending bed availability.   Shann Medal, PT, DPT Admissions Coordinator [phone] 06/04/20  11:50 AM

## 2020-06-04 NOTE — Plan of Care (Signed)
Patient stable. Discharge plan is CIR - still waiting for bed. Will continue to monitor and continue current POC.

## 2020-06-04 NOTE — Plan of Care (Signed)
No acute events since the previous night that I took care of him. Dialysis today - took off 3 L. Patient is in no apparent distress and no needs voiced. Will continue to monitor and continue current POC.

## 2020-06-04 NOTE — Progress Notes (Signed)
[facility] Progress Note   Subjective:   Patient seen and examined at bedside.  Continues to have diarrhea.  Denies CP, SOB, and n/v.  Objective Vitals:   06/04/20 0812 06/04/20 0830 06/04/20 0900 06/04/20 0930  BP: (!) 157/99 (!) (P) 152/98 (!) (P) 132/99 (!) 144/94  Pulse:      Resp: 12   13  Temp:      TempSrc:      SpO2:      Weight:      Height:       Physical Exam General:chronically ill appearing male in NAD Heart:RRR, no mrg Lungs:CTAB, nml WOB Abdomen:soft, +tenderness LLQ, NTND Extremities:no LE edema, b/l heels wrapped Dialysis Access: [facility]   Filed Weights   06/01/20 0731 06/01/20 1055 06/02/20 0500  Weight: (!) 156.8 kg (!) 154.8 kg (!) 158.3 kg    Intake/Output Summary (Last 24 hours) at 06/04/2020 1009 Last data filed at 06/03/2020 1900 Gross per 24 hour  Intake 120 ml  Output --  Net 120 ml    Additional Objective Labs: Basic Metabolic Panel: Recent Labs  Lab 05/31/20 0814 06/01/20 0757 06/02/20 0200 06/03/20 0236 06/04/20 0328  NA  --  134* 137 136 135  K  --  4.3 4.2 4.2 4.6  CL  --  97* 98 99 96*  CO2  --  25 28 24 23   GLUCOSE  --  89 154* 95 69*  BUN  --  43* 28* 39* 48*  CREATININE  --  6.47* 4.83* 6.06* 7.64*  CALCIUM  --  9.3 8.5* 9.0 9.0  PHOS 5.9* 6.6* 5.4*  --   --    Liver Function Tests: Recent Labs  Lab 06/01/20 0757  ALBUMIN 1.8*   CBC: Recent Labs  Lab 05/29/20 0213 05/30/20 0649 06/01/20 0757 06/03/20 0236  WBC 9.5 10.0 9.9 10.4  HGB 9.4* 9.5* 9.4* 9.5*  HCT 31.6* 30.7* 30.3* 30.4*  MCV 88.5 87.7 86.8 86.1  PLT 196 207 252 269   CBG: Recent Labs  Lab 06/03/20 0620 06/03/20 1207 06/03/20 1708 06/03/20 2123 06/04/20 0707  GLUCAP 93 82 75 98 90   Iron Studies: No results for input(s): IRON, TIBC, TRANSFERRIN, FERRITIN in the last 72 hours. Lab Results  Component Value Date   INR 1.2 04/29/2020   Studies/Results: MR LUMBAR SPINE WO CONTRAST  Result Date: 06/02/2020 CLINICAL DATA:   Lower extremity weakness and poor rectal tone. EXAM: MRI LUMBAR SPINE WITHOUT CONTRAST TECHNIQUE: Multiplanar, multisequence MR imaging of the lumbar spine was performed. No intravenous contrast was administered. COMPARISON:  None. FINDINGS: Segmentation: Transitional lumbosacral anatomy with rudimentary L5-S1 disc space and right sided L5-S1 assimilation joint. Alignment:  Normal Vertebrae:  No fracture, evidence of discitis, or bone lesion. Conus medullaris and cauda equina: Conus extends to the L1 level. Conus and cauda equina appear normal. Paraspinal and other soft tissues: Negative Disc levels: L1-L2: Normal disc space and facet joints. No spinal canal stenosis. No neural foraminal stenosis. L2-L3: Large left asymmetric disc bulge. Moderate-to-severe spinal canal stenosis. Stenosis is greatest in the left lateral recess. There is no neural foraminal stenosis. L3-L4: Moderate facet hypertrophy with small disc bulge. No spinal canal stenosis. No neural foraminal stenosis. L4-L5: Mild facet hypertrophy. Normal disc. No spinal canal stenosis. No neural foraminal stenosis. L5-S1: Small disc space. No spinal canal stenosis. No neural foraminal stenosis. Visualized sacrum: Normal. IMPRESSION: 1. Large left asymmetric disc bulge at L2-L3 with moderate-to-severe spinal canal stenosis, greatest in the left lateral  recess. 2. Moderate facet arthrosis at L3-4 and L4-5. 3. Transitional lumbosacral anatomy with rudimentary L5-S1 disc space and right sided L5-S1 assimilation joint. Electronically Signed   By: Ulyses Jarred M.D.   On: 06/02/2020 22:44    Medications: . ceFEPime (MAXIPIME) IV 2 g (06/01/20 1805)  . vancomycin 1,000 mg (06/01/20 0945)   . heparin sodium (porcine)      . (feeding supplement) PROSource Plus  30 mL Oral TID BM  . Chlorhexidine Gluconate Cloth  6 each Topical Q0600  . darbepoetin (ARANESP) injection - DIALYSIS  200 mcg Intravenous Q Thu-HD  . famotidine  20 mg Oral QHS  . feeding  supplement (NEPRO CARB STEADY)  237 mL Oral BID BM  . heparin injection (subcutaneous)  7,500 Units Subcutaneous Q8H  . insulin aspart  0-5 Units Subcutaneous QHS  . insulin aspart  0-9 Units Subcutaneous TID WC  . insulin glargine  14 Units Subcutaneous QHS  . metoCLOPramide  5 mg Oral TID AC  . multivitamin  1 tablet Oral QHS  . sodium chloride flush  3 mL Intravenous Q12H    Dialysis Orders: NWTTS 4h 400/800 143.5kg 3K/2.5Ca TDCHeparin3000units IV TIW - Mircera18mcgIVq2wks - last 12/8 - Venofer 100mg IVqHD x10 - completed 6/10 doses  Assessment/Plan: 1. Sacral decub/coccygealosteo: On Vanc/Cefepime x 6 weeks (thru 06/10/20) - ID following.Continues hydrotherapy.Placement pending.May be getting admitted to CIR - no current beds available. 2. 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 3. Diarrhea - unclear etiology.  Renvela doesn't typically cause diarrhea but it sometimes can, will hold and see if improves. 4. COVID PNA- tested positive on 11/30, is off isolation now. 5. ESRD: on HD TTS.HD today off schedule.  Roll over due to high patient census.  HD again tomorrow per regular schedule.K 4.6.  6. BP/volume:BP mostly well controlled. Had issues with hypotension which improved once amlodipine and metoprolol d/c'd. Keep SBP >100 on HD.Continues to have orthostatic hypotension.Appears euvolemic on exam. +diarrhea, plan run even with HD today, let volume come up a little. 7. Anemiaof CKD- last Hgb9.5,cont Aranesp 267mcgq Thursday. 8. Secondary Hyperparathyroidism: [place]igh, phos in goal.Not on VDRA.Using lowest available Ca bath with HD.Hold renvela to see if source of diarrhea.  If not improved over weekend will restart, if improved start different binder.  9. Nutrition: last Alb very low, continue Nepro + pro-source supplements.On carb modified diet. Follow labs. 10. DM T2 - per admit   Jen Mow,  PA-C [facility] 06/04/2020,10:09 AM  LOS: 36 days

## 2020-06-04 NOTE — Progress Notes (Addendum)
Physical Therapy Treatment Patient Details Name: Fred [name] MRN: [id number] DOB: 09-Feb-1971 Today's Date: 06/04/2020    History of Present Illness Pt is a 50 y.o. male admitted from [facility] on 04/29/20 for evaluation of low oxygen saturations 82% on room air; concern for sepsis form Covid PNA, sacral wound and foot wounds. 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 Of note, pt recently admitted to [facility] due to gallbladder problems then d/c to [facility](14 days) for rehab then readmitted to [facility] then d/c to [facility] for rehab. PMH includes ESRD on HD (started 9/21), HTN, DM, C. diff. Limited by persistent orthostatic hypotension during hospitalization.    PT Comments    Pt with HOB elevated in supine on arrival, agreeable to therapy session and with good participation and tolerance for mobility. Pt performed bed mobility with Supervision at most, static sitting with Supervision and modI for rolling, he was incontinent upon PTA arrival to room and needed to roll for cleanup. Pt abdominal binder donned and BLE ace wrapped ankles up to thighs for improved hemodynamic stability prior to EOB mobility and pt head of bed was elevated for ~40 mins prior to starting session. Pt denies dizziness in seated posture and was able to stand ~1 minute in [place] prior to needing to sit impulsively due to sxs dizziness/"feeling drunk", symptoms slightly improved from previous session's report. Pt performed sit<>stand with +55maxA and able to perform RUE activity while standing with +44min/modA support. Pt performed seated/supine BLE/BUE therapeutic exercises with good tolerance and was instructed extensively on importance of compliance with HEP, pressure offloading and positional changes (HOB up/down) throughout day for improved hemodynamics/strengthening to improve functional mobility and tolerance for therapy, pt receptive to instruction.  Pt could benefit from being hoyer lifted to recliner on non-therapy days for ~1hr to improve tolerance to upright position. Pt continues to benefit from PT services to progress toward functional mobility goals. D/C recs below remain appropriate. Orthostatic BPs Supine (HOB ~40 deg) 157/95 (114)  Sitting 152/108 (120)  Stand to sit (unable to remain upright long enough for reading prior to sitting) 125/84 (95)  Supine 131/96 (106)     Follow Up Recommendations  CIR;Supervision for mobility/OOB     Equipment Recommendations  Wheelchair (measurements PT);Wheelchair cushion (measurements PT);Hospital bed;Other (comment)    Recommendations for Other Services       Precautions / Restrictions Precautions Precautions: Fall Precaution Comments: wounds on bilat feet and L sacrum, monitor orthostatic BPs Required Braces or Orthoses: Other Brace Other Brace: Ace wrap in room to be donned to BLE and abdominal binder prior to EOB, B prevalon boots in room Restrictions Weight Bearing Restrictions: No    Mobility  Bed Mobility Overal bed mobility: Needs Assistance Bed Mobility: Rolling;Sidelying to Sit;Sit to Sidelying Rolling: Modified independent (Device/Increase time) Sidelying to sit: Supervision     Sit to sidelying: Supervision General bed mobility comments: pt able to perform log roll with supervision and pull up in bed in supine with supervision  Transfers Overall transfer level: Needs assistance   Transfers: Sit to/from Stand Sit to Stand: Max assist;+2 physical assistance;From elevated surface         General transfer comment: STS x1 attempt (dizzy)  Ambulation/Gait                 Stairs             Wheelchair Mobility    Modified Rankin (Stroke Patients Only)  Balance Overall balance assessment: Needs assistance Sitting-balance support: Feet supported;Single extremity supported Sitting balance-Leahy Scale: Fair Sitting balance - Comments:  able to sit EOB with close supervision   Standing balance support: Bilateral upper extremity supported Standing balance-Leahy Scale: Poor Standing balance comment: reliant on Stedy rail and +2 min/modA for upright posture, able to stand with U UE support and do LUE activity with modA; stood ~1 minute                            Cognition Arousal/Alertness: Awake/alert Behavior During Therapy: WFL for tasks assessed/performed;Flat affect Overall Cognitive Status: No family/caregiver present to determine baseline cognitive functioning Area of Impairment: Attention;Following commands                   Current Attention Level: Selective   Following Commands: Follows one step commands consistently;Follows one step commands with increased time Safety/Judgement: Decreased awareness of safety;Decreased awareness of deficits Awareness: Emergent Problem Solving: Slow processing;Requires verbal cues General Comments: Pt pleasant and motivated to complete all tasks, increased processing times and at times poor insight into functional deficits and what he needs to do to progress strength/endurance despite frequent instruction. Reinforced therex and HOB up/down during day every day.      Exercises General Exercises - Lower Extremity Ankle Circles/Pumps: AROM;Both;15 reps;Supine Heel Slides: AROM;Strengthening;Both;10 reps;Supine Hip ABduction/ADduction: AROM;Strengthening;Both;10 reps;Supine Straight Leg Raises: AAROM;Both;10 reps;Supine Hip Flexion/Marching: AROM;Strengthening;Both;Seated;10 reps Other Exercises Other Exercises: seated rowing exercise BUE with resistance provided by therapist for BUE strengthening and improved hemodynamics x30 seconds    General Comments        Pertinent Vitals/Pain Pain Assessment: No/denies pain Faces Pain Scale: No hurt Pain Intervention(s): Monitored during session;Repositioned    Home Living                      Prior  Function            PT Goals (current goals can now be found in the care plan section) Acute Rehab PT Goals Patient Stated Goal: to get better and not be dizzy PT Goal Formulation: With patient Time For Goal Achievement: 06/09/20 Potential to Achieve Goals: Fair Progress towards PT goals: Progressing toward goals    Frequency    Min 3X/week      PT Plan Current plan remains appropriate    Co-evaluation PT/OT/SLP Co-Evaluation/Treatment: Yes Reason for Co-Treatment: Complexity of the patient's impairments (multi-system involvement);For patient/therapist safety;To address functional/ADL transfers PT goals addressed during session: Mobility/safety with mobility;Balance;Proper use of DME;Strengthening/ROM        AM-PAC PT "6 Clicks" Mobility   Outcome Measure  Help needed turning from your back to your side while in a flat bed without using bedrails?: None Help needed moving from lying on your back to sitting on the side of a flat bed without using bedrails?: A Little Help needed moving to and from a bed to a chair (including a wheelchair)?: A Lot Help needed standing up from a chair using your arms (e.g., wheelchair or bedside chair)?: A Lot Help needed to walk in hospital room?: Total Help needed climbing 3-5 steps with a railing? : Total 6 Click Score: 13    End of Session Equipment Utilized During Treatment: Gait belt (BLE ace wrapped ankles to upper thighs and abdominal binder donned prior to EOB/standing) Activity Tolerance: Patient tolerated treatment well Patient left: in bed;with call bell/phone within reach;Other (comment) (B prevalon boots donned) Nurse  Communication: Mobility status;Other (comment) (orthostatic hypotension VS) PT Visit Diagnosis: Muscle weakness (generalized) (M62.81);Difficulty in walking, not elsewhere classified (R26.2)     Time: 8871-9597 PT Time Calculation (min) (ACUTE ONLY): 38 min  Charges:  $Therapeutic Exercise: 8-22  mins $Therapeutic Activity: 8-22 mins                     Deliana Avalos P., PTA Acute Rehabilitation Services Pager: [phone] Office: [place] 06/04/2020, 3:21 PM

## 2020-06-04 NOTE — Progress Notes (Signed)
OT Cancellation Note  Patient Details Name: Fred [name] MRN: [id number] DOB: 14-Dec-1970   Cancelled Treatment:    Reason Eval/Treat Not Completed: Patient at procedure or test/ unavailable. Pt at HD, will follow up next available time  Britt Bottom 06/04/2020, 8:52 AM

## 2020-06-04 NOTE — Progress Notes (Signed)
Report given to dialysis nurse.

## 2020-06-04 NOTE — Progress Notes (Signed)
PT Cancellation Note  Patient Details Name: Fred [name] MRN: [id number] DOB: 1970/09/26   Cancelled Treatment:    Reason Eval/Treat Not Completed: (P) Patient at procedure or test/unavailable Pt at HD dept, will continue efforts in afternoon per PT POC as schedule permits.   Zoha Spranger M Shauntay Brunelli 06/04/2020, 9:22 AM

## 2020-06-04 NOTE — Progress Notes (Signed)
HD#36 Subjective:   No acute events overnight.  Patient at dialysis this am. He continues to have episodes of diarrhea, he endorses receiving one dose of loperamide yesterday. He states he is quite frustrated at being able to walk but knows we are doing all we can. I informed him we will continue to work with nephrology to keep his blood pressure elevated in hopes of him being less orthostatic.    Objective:  Vital signs in last 24 hours: Vitals:   06/04/20 0900 06/04/20 0930 06/04/20 1000 06/04/20 1030  BP: (!) 132/99 (!) 144/94 (!) 149/102 (!) 161/88  Pulse:      Resp:  13 13 13   Temp:      TempSrc:      SpO2:      Weight:      Height:       Supplemental O2: Room Air SpO2: 99 % O2 Flow Rate (L/min): 25 L/min  Physical Exam Vitals and nursing note reviewed.  Constitutional:      General: He is not in acute distress.    Appearance: Normal appearance. He is not ill-appearing or toxic-appearing.  Cardiovascular:     Rate and Rhythm: Normal rate and regular rhythm.     Pulses: Normal pulses.     Heart sounds: Normal heart sounds. No murmur heard. No gallop.   Pulmonary:     Effort: Pulmonary effort is normal. No respiratory distress.     Breath sounds: Normal breath sounds. No stridor. No wheezing, rhonchi or rales.  Abdominal:     Palpations: Abdomen is soft.  Musculoskeletal:     Cervical back: Neck supple.  Skin:    General: Skin is warm and dry.  Neurological:     Mental Status: He is alert and oriented to person, place, and time. Mental status is at baseline.  Psychiatric:        Mood and Affect: Mood normal.        Behavior: Behavior normal.     Filed Weights   06/01/20 0731 06/01/20 1055 06/02/20 0500  Weight: (!) 156.8 kg (!) 154.8 kg (!) 158.3 kg     Intake/Output Summary (Last 24 hours) at 06/04/2020 1106 Last data filed at 06/04/2020 0900 Gross per 24 hour  Intake 120 ml  Output --  Net 120 ml   Net IO Since Admission: -22,810.45 mL  [06/04/20 1106]  Pertinent Labs: CBC Latest Ref Rng & Units 06/03/2020 06/01/2020 05/30/2020  WBC 4.0 - 10.5 K/uL 10.4 9.9 10.0  Hemoglobin 13.0 - 17.0 g/dL 9.5(L) 9.4(L) 9.5(L)  Hematocrit 39.0 - 52.0 % 30.4(L) 30.3(L) 30.7(L)  Platelets 150 - 400 K/uL 269 252 207    CMP Latest Ref Rng & Units 06/04/2020 06/03/2020 06/02/2020  Glucose 70 - 99 mg/dL 69(L) 95 154(H)  BUN 6 - 20 mg/dL 48(H) 39(H) 28(H)  Creatinine 0.61 - 1.24 mg/dL 7.64(H) 6.06(H) 4.83(H)  Sodium 135 - 145 mmol/L 135 136 137  Potassium 3.5 - 5.1 mmol/L 4.6 4.2 4.2  Chloride 98 - 111 mmol/L 96(L) 99 98  CO2 22 - 32 mmol/L 23 24 28   Calcium 8.9 - 10.3 mg/dL 9.0 9.0 8.5(L)  Total Protein 6.5 - 8.1 g/dL - - -  Total Bilirubin 0.3 - 1.2 mg/dL - - -  Alkaline Phos 38 - 126 U/L - - -  AST 15 - 41 U/L - - -  ALT 0 - 44 U/L - - -    Imaging: No results found.  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   #Unstageable sacral decubitus ulcer #Coccygeal osteomyelitis Remains on long term antibiotics.Stable and awaiting CIR placement. 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 Sharol Given for progression of dry gangrene of left 4th toe. Per patient, Dr. Sharol Given would like him to follow up on outpatient basis. - follow up with ortho outpatient - C/w antibiotics - C/w wound care, Prevalon boots  #Hx of Hypertension #Orthostatic Patient continues to be orthostatic, plan to keep even today per nephro. Goal will be for patient to be hypertensive in hopes of not being orthostatic. Will continue to work with nephrology concerning this. Discontinued home anti-hypertensives.  -nephro to keep patient net even today during dialysis -Abdominal belt, SCD during PT sessions. Both items in patient's room - Continue to hold amlodipine and metoprolol. Restart once patient no longer hypotensive   #Diarrhea Patient notes continued episodes of diarrhea, received one dose of immodium yesterday. Will schedule every 8 hours, recommended max daily dose of 16 mg. Also not thought to be dialyzed. Reached out to nephro to discuss possibility of renvela contributing to diarrhea episodes, they note not common but will hold and see if diarrhea improves. -start imodium -hold renvela - if continues to have diarrhea, will consider stool panel/enteric precautions.   #Deconditioning #Generalized weakness PT continuing to work with patient. Patient with complaints on 06/02/19 - Continue feeding supplements for nutrition - Continue to move patient to chair twice daily - PT/OT - Pending CIR placement  #ESRD on HD TTS dialysis schedule. Dialysis today as high in hospital dialysis schedule - Nephrology following, appreciate their recommendations. - Daily BMP  Hx of Type II DM Glucose readings improved.  - SSI with meals - nightly lantus 14 U  Diet:  Carb-Modified IVF: None,None VTE: Heparin Code: Full PT/OT recs: CIR  Dispo: Anticipated discharge to Skilled nursing facility pending CIR bed availability.  Sanjuana Letters DO Internal Medicine Resident PGY-2 Pager [phone] Please contact the on call pager after 5 pm and on weekends at [phone].

## 2020-06-04 NOTE — Plan of Care (Signed)
  Problem: Safety: Goal: Ability to remain free from injury will improve Outcome: Progressing   Problem: Skin Integrity: Goal: Risk for impaired skin integrity will decrease Outcome: Progressing   Problem: Activity: Goal: Risk for activity intolerance will decrease Outcome: Progressing

## 2020-06-04 NOTE — Progress Notes (Signed)
Occupational Therapy Treatment Patient Details Name: Fred [name] MRN: [id number] DOB: 28-Jun-1970 Today's Date: 06/04/2020    History of present illness Pt is a 50 y.o. male admitted from [facility] on 04/29/20 for evaluation of low oxygen saturations 82% on room air; concern for sepsis form Covid PNA, sacral wound and foot wounds. 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 Of note, pt recently admitted to [facility] due to gallbladder problems then d/c to [facility](14 days) for rehab then readmitted to [facility] then d/c to [facility] for rehab. PMH includes ESRD on HD (started 9/21), HTN, DM, C. diff. Limited by persistent orthostatic hypotension during hospitalization.   OT comments  Pt making progress with functional goals. Pt reports that he is feeling better today and diarrhea has improved. Pt sate EOB with sup and sat EOB with close sup. Sit - stand using Stedy max A +2. Pt stood in [place] to wash and dry hands and face. Pt unable to stand in [place] long enough to get BP reading and returned to sitting EOB due to dizziness. OT will continue to follow acutely to maximize level of function and safety. BP Sup 157/95 Sitting 152/108 Unable to stand long enough for reading   Follow Up Recommendations  CIR;Supervision/Assistance - 24 hour    Equipment Recommendations  3 in 1 bedside commode;Wheelchair (measurements OT);Wheelchair cushion (measurements OT);Hospital bed    Recommendations for Other Services      Precautions / Restrictions Precautions Precautions: Fall Precaution Comments: wounds on bilat feet and L sacrum, monitor orthostatic BPs Required Braces or Orthoses: Other Brace Other Brace: Ace wrap in room to be donned to BLE and abdominal binder prior to EOB, B prevalon boots in room Restrictions Weight Bearing Restrictions: No       Mobility Bed Mobility Overal bed mobility: Needs Assistance Bed  Mobility: Rolling;Sidelying to Sit;Sit to Sidelying Rolling: Modified independent (Device/Increase time) Sidelying to sit: Supervision     Sit to sidelying: Supervision General bed mobility comments: pt able to perform log roll with supervision and pull up in bed in supine with supervision  Transfers Overall transfer level: Needs assistance   Transfers: Sit to/from Stand Sit to Stand: Max assist;+2 physical assistance;From elevated surface         General transfer comment: STS x1 attempt (dizzy)    Balance Overall balance assessment: Needs assistance Sitting-balance support: Feet supported;Single extremity supported Sitting balance-Leahy Scale: Fair Sitting balance - Comments: able to sit EOB with close supervision   Standing balance support: Bilateral upper extremity supported;During functional activity Standing balance-Leahy Scale: Poor Standing balance comment: reliant on Stedy rail and +2 min/modA for upright posture, able to stand with U UE support and do LUE activity with modA; stood ~1 minute                           ADL either performed or assessed with clinical judgement   ADL                                               Vision Patient Visual Report: No change from baseline     Perception     Praxis      Cognition Arousal/Alertness: Awake/alert Behavior During Therapy: WFL for tasks assessed/performed;Flat affect Overall Cognitive Status: No family/caregiver present to determine  baseline cognitive functioning Area of Impairment: Attention;Following commands                   Current Attention Level: Selective   Following Commands: Follows one step commands consistently;Follows one step commands with increased time Safety/Judgement: Decreased awareness of safety;Decreased awareness of deficits Awareness: Emergent Problem Solving: Slow processing;Requires verbal cues General Comments: Pt pleasant and motivated to  complete all tasks, increased processing times and at times poor insight into functional deficits and what he needs to do to progress strength/endurance despite frequent instruction. Reinforced therex and HOB up/down during day every day.        Exercises Exercises: General Lower Extremity;Other exercises General Exercises - Lower Extremity Ankle Circles/Pumps: AROM;Both;15 reps;Supine Heel Slides: AROM;Strengthening;Both;10 reps;Supine Hip ABduction/ADduction: AROM;Strengthening;Both;10 reps;Supine Straight Leg Raises: AAROM;Both;10 reps;Supine Hip Flexion/Marching: AROM;Strengthening;Both;Seated;10 reps Other Exercises Other Exercises: seated rowing exercise BUE with resistance provided by therapist for BUE strengthening and improved hemodynamics x30 seconds   Shoulder Instructions       General Comments      Pertinent Vitals/ Pain       Pain Assessment: No/denies pain Pain Score: 0-No pain Faces Pain Scale: No hurt Pain Intervention(s): Monitored during session  Home Living                                          Prior Functioning/Environment              Frequency  Min 2X/week        Progress Toward Goals  OT Goals(current goals can now be found in the care plan section)  Progress towards OT goals: Progressing toward goals  Acute Rehab OT Goals Patient Stated Goal: to get better and not be dizzy  Plan Discharge plan remains appropriate    Co-evaluation    PT/OT/SLP Co-Evaluation/Treatment: Yes Reason for Co-Treatment: Complexity of the patient's impairments (multi-system involvement);For patient/therapist safety;To address functional/ADL transfers PT goals addressed during session: Mobility/safety with mobility;Balance;Proper use of DME;Strengthening/ROM OT goals addressed during session: ADL's and self-care;Proper use of Adaptive equipment and DME      AM-PAC OT "6 Clicks" Daily Activity     Outcome Measure   Help from another  person eating meals?: None Help from another person taking care of personal grooming?: A Little Help from another person toileting, which includes using toliet, bedpan, or urinal?: Total Help from another person bathing (including washing, rinsing, drying)?: A Lot Help from another person to put on and taking off regular upper body clothing?: A Little Help from another person to put on and taking off regular lower body clothing?: A Lot 6 Click Score: 15    End of Session Equipment Utilized During Treatment: Gait belt;Other (comment) Charlaine Dalton)      Activity Tolerance Patient limited by fatigue   Patient Left Other (comment) (with PT staff)   Nurse Communication          Time: 6168-3729 OT Time Calculation (min): 21 min  Charges: OT General Charges $OT Visit: 1 Visit OT Treatments $Therapeutic Activity: 8-22 mins     Britt Bottom 06/04/2020, 3:30 PM

## 2020-06-05 LAB — BASIC METABOLIC PANEL
Anion gap: 12 (ref 5–15)
BUN: 32 mg/dL — ABNORMAL HIGH (ref 6–20)
CO2: 25 mmol/L (ref 22–32)
Calcium: 8.6 mg/dL — ABNORMAL LOW (ref 8.9–10.3)
Chloride: 99 mmol/L (ref 98–111)
Creatinine, Ser: 5.9 mg/dL — ABNORMAL HIGH (ref 0.61–1.24)
GFR, Estimated: 11 mL/min — ABNORMAL LOW (ref >60)
Glucose, Bld: 115 mg/dL — ABNORMAL HIGH (ref 70–99)
Potassium: 4.4 mmol/L (ref 3.5–5.1)
Sodium: 136 mmol/L (ref 135–145)

## 2020-06-05 LAB — CBC
HCT: 31.5 % — ABNORMAL LOW (ref 39.0–52.0)
Hemoglobin: 9.5 g/dL — ABNORMAL LOW (ref 13.0–17.0)
MCH: 26.4 pg (ref 26.0–34.0)
MCHC: 30.2 g/dL (ref 30.0–36.0)
MCV: 87.5 fL (ref 80.0–100.0)
Platelets: 245 10*3/uL (ref 150–400)
RBC: 3.6 MIL/uL — ABNORMAL LOW (ref 4.22–5.81)
RDW: 16.9 % — ABNORMAL HIGH (ref 11.5–15.5)
WBC: 9 10*3/uL (ref 4.0–10.5)
nRBC: 0 % (ref 0.0–0.2)

## 2020-06-05 LAB — GLUCOSE, CAPILLARY
Glucose-Capillary: 116 mg/dL — ABNORMAL HIGH (ref 70–99)
Glucose-Capillary: 182 mg/dL — ABNORMAL HIGH (ref 70–99)
Glucose-Capillary: 108 mg/dL — ABNORMAL HIGH (ref 70–99)
Glucose-Capillary: 167 mg/dL — ABNORMAL HIGH (ref 70–99)

## 2020-06-05 MED ORDER — LIDOCAINE HCL (PF) 1 % IJ SOLN: 5.0000 mL | INTRAMUSCULAR | Status: DC | PRN

## 2020-06-05 MED ORDER — SODIUM CHLORIDE 0.9 % IV SOLN: 100.0000 mL | INTRAVENOUS | Status: DC | PRN

## 2020-06-05 MED ORDER — DARBEPOETIN ALFA 200 MCG/0.4ML IJ SOSY
200.0000 ug | PREFILLED_SYRINGE | INTRAMUSCULAR | Status: DC
  Filled 2020-06-05: qty 0.4

## 2020-06-05 MED ORDER — HEPARIN SODIUM (PORCINE) 1000 UNIT/ML DIALYSIS: 1000.0000 [IU] | INTRAMUSCULAR | Status: DC | PRN

## 2020-06-05 MED ORDER — ALTEPLASE 2 MG IJ SOLR: 2.0000 mg | Freq: Once | INTRAMUSCULAR | Status: DC | PRN

## 2020-06-05 MED ORDER — HEPARIN SODIUM (PORCINE) 1000 UNIT/ML IJ SOLN
INTRAMUSCULAR | Status: AC
  Filled 2020-06-05: qty 4

## 2020-06-05 MED ORDER — PENTAFLUOROPROP-TETRAFLUOROETH EX AERO: 1.0000 "application " | INHALATION_SPRAY | CUTANEOUS | Status: DC | PRN

## 2020-06-05 MED ORDER — LIDOCAINE-PRILOCAINE 2.5-2.5 % EX CREA: 1.0000 "application " | TOPICAL_CREAM | CUTANEOUS | Status: DC | PRN

## 2020-06-05 NOTE — Progress Notes (Signed)
Subjective:   For hd today , Diarrhea slowing   With loperamide   Objective Vital signs in last 24 hours: Vitals:   06/04/20 1413 06/04/20 1930 06/05/20 0300 06/05/20 0751  BP: (!) 157/95 (!) 156/97 (!) (P) 149/82 (!) 136/94  Pulse: (!) 101 (!) 108 (P) 94 97  Resp: 18 18 (P) 17 18  Temp: 98.4 F (36.9 C) 98.5 F (36.9 C) (P) 98.5 F (36.9 C) 98.5 F (36.9 C)  TempSrc: Oral Oral (P) Oral Oral  SpO2: 98% 99% (P) 99% 100%  Weight:      Height:       Weight change:    Physical Exam General:Alert ,chronically ill appearing male, NAD Heart:RRR, no mrg Lungs:CTAB, nml WOB Abdomen:soft BS +, NT, ND Extremities:no LE edema, b/l heels wrapped Dialysis Access:  R IJ TDC     Dialysis Orders: NWTTS 4h 400/800 143.5kg 3K/2.5Ca TDCHeparin3000units IV TIW - Mircera135mcgIVq2wks - last 12/8 - Venofer 100mg IVqHD x10 - completed 6/10 doses  Problem/Plan: 1. Sacral decub/coccygealosteo: On Vanc/Cefepime x 6 weeks (thru 06/10/20) - ID following.Continues hydrotherapy.Placement pending.May be gettingadmitted to CIR - no current beds available. 2. 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 3. ESRD: on HD TTS.HD was  off schedule.  Roll over due to high patient census.  HD today  per regular schedule.K 4.6. on 1/14  4. Diarrhea - ?antibio effect .- now slowing ,??  Renvela  will hold and see if improves. 5. BP/volume:BP mostly well controlled. Had issues with hypotension which improved once amlodipine and metoprolol d/c'd. Keep SBP >100 on HD.Continues to have orthostatic hypotension.Appears euvolemic on exam.+diarrhea, plan run even with HD  let volume come up a little. 6. COVID PNA- tested positive on 11/30, is off isolation now.  7. Anemiaof CKD- last Hgb9.5,cont Aranesp 215mcgq Thursday. 8. Secondary Hyperparathyroidism: [place], phos 5.4 Not on VDRA.Using lowest available Ca bath with HD.Hold renvela to see if source of  diarrhea.  If not improved over weekend will restart, if improved start different binder.  9. Nutrition: last Alb 1.8 , continue Nepro + pro-source supplements.On carb modified diet. Follow labs. 10. DM T2 - per admit   Ernest Haber, PA-C [facility] Beeper [phone] 06/05/2020,9:21 AM  LOS: 37 days   Labs: Basic Metabolic Panel: Recent Labs  Lab 05/31/20 0814 06/01/20 0757 06/02/20 0200 06/03/20 0236 06/04/20 0328  NA  --  134* 137 136 135  K  --  4.3 4.2 4.2 4.6  CL  --  97* 98 99 96*  CO2  --  25 28 24 23   GLUCOSE  --  89 154* 95 69*  BUN  --  43* 28* 39* 48*  CREATININE  --  6.47* 4.83* 6.06* 7.64*  CALCIUM  --  9.3 8.5* 9.0 9.0  PHOS 5.9* 6.6* 5.4*  --   --    Liver Function Tests: Recent Labs  Lab 06/01/20 0757  ALBUMIN 1.8*   No results for input(s): LIPASE, AMYLASE in the last 168 hours. No results for input(s): AMMONIA in the last 168 hours. CBC: Recent Labs  Lab 05/30/20 0649 06/01/20 0757 06/03/20 0236  WBC 10.0 9.9 10.4  HGB 9.5* 9.4* 9.5*  HCT 30.7* 30.3* 30.4*  MCV 87.7 86.8 86.1  PLT 207 252 269   Cardiac Enzymes: No results for input(s): CKTOTAL, CKMB, CKMBINDEX, TROPONINI in the last 168 hours. CBG: Recent Labs  Lab 06/04/20 0707 06/04/20 1232 06/04/20 1606 06/04/20 1928 06/05/20 0634  GLUCAP 90  79 128* 111* 116*    Studies/Results: No results found. Medications: . ceFEPime (MAXIPIME) IV 2 g (06/04/20 1627)  . ceFEPime (MAXIPIME) IV    . vancomycin 1,000 mg (06/04/20 1119)  . vancomycin     . (feeding supplement) PROSource Plus  30 mL Oral TID BM  . Chlorhexidine Gluconate Cloth  6 each Topical Q0600  . darbepoetin (ARANESP) injection - NON-DIALYSIS  200 mcg Subcutaneous Q Sat-1800  . famotidine  20 mg Oral QHS  . feeding supplement (NEPRO CARB STEADY)  237 mL Oral BID BM  . heparin injection (subcutaneous)  7,500 Units Subcutaneous Q8H  . insulin aspart  0-5 Units Subcutaneous QHS  . insulin aspart   0-9 Units Subcutaneous TID WC  . insulin glargine  14 Units Subcutaneous QHS  . loperamide  2 mg Oral Q4H  . metoCLOPramide  5 mg Oral TID AC  . multivitamin  1 tablet Oral QHS  . sodium chloride flush  3 mL Intravenous Q12H

## 2020-06-05 NOTE — Progress Notes (Signed)
Pharmacy Antibiotic Note- follow up   Fred [name] is a 50 y.o. male admitted on 04/29/2020 with coccygeal osteomyelitis.  Pharmacy consulted for vancomycin and cefepime dosing. ID planning to treat x 6 weeks through 06/10/20. Patient was previously on MWF HD and has been transitioned back to TTS.  Tolerating HD sessions.  Update: Missed 1/13 HD schedule due to high hospital census. Received off-schedule HD 1/14 with vancomycin 1 g IV ordered for 1 time dose but not given.  Patient to return to HD today to resume TTS schedule with scheduled vanc/cefepime. Will continue weekly pre-HD vanc levels until 06/10/2020. WBC slightly up to 10.4 and afebrile.  Plan: - Continue vancomycin 1 g IV QHD-TTS - Continue cefepime 1 g IV QHD-TTS - Monitor HD tolerance, clinical progress, PRN vanc levels   Height: 6\' 6"  (198.1 cm) Weight: (!) 158.3 kg (349 lb) IBW/kg (Calculated) : 91.4  Temp (24hrs), Avg:98.4 F (36.9 C), Min:98.1 F (36.7 C), Max:98.5 F (36.9 C)  Recent Labs  Lab 05/29/20 1458 05/30/20 0649 05/30/20 0649 05/31/20 0802 06/01/20 0757 06/01/20 0921 06/02/20 0200 06/03/20 0236 06/04/20 0328  WBC  --  10.0  --   --  9.9  --   --  10.4  --   CREATININE  --  3.85*   < > 5.42* 6.47*  --  4.83* 6.06* 7.64*  VANCOTROUGH 31*  --   --   --   --   --   --   --   --   VANCORANDOM 31  --   --   --   --  24  --   --   --    < > = values in this interval not displayed.    Estimated Creatinine Clearance: 19.6 mL/min (A) (by C-G formula based on SCr of 7.64 mg/dL (H)).    Allergies  Allergen Reactions  . Atorvastatin Hives    NOT on [facility]   12/19 Vanc >> (1/20) 12/9 Cefepime >> (1/20) 12/9 Remdesivir >> 12/14  12/20 pre-HD VL: 18 mcg/mL on 1g qMWF >> no change 12/30 pre HD VL: 23 mcg/mL on 1g qTTS >> no change  1/8 VR: 31 mcg/mL (drawn after HD and vanc dose >> inaccurate) 1/11 pre-HD VL: 24 mcg/mL >> no change  12/9 BCx: negativeF 12/9 COVID: Positive      Chandria Rookstool L. Devin Going, PharmD,  [place] PGY2 Pharmacy Resident Weekends 7:00 am - 3:00 pm, please call [phone] 06/05/20      9:35 AM  Please check AMION for all [place] phone numbers After 10:00 PM, call the [place] [phone]

## 2020-06-05 NOTE — Progress Notes (Signed)
HD#37 Subjective:   No acute events overnight.  Patient resting in bed comfortably with nursing at bedside. Patient denies any new complaints. Notes that he has had less frequent episodes of diarrhea, only twice daily. He also states he felt as though he was less dizzy yesterday during his dialysis session, but was still limited in mobility with standing.    Objective:  Vital signs in last 24 hours: Vitals:   06/04/20 1137 06/04/20 1413 06/04/20 1930 06/05/20 0300  BP: (!) 157/95 (!) 157/95 (!) 156/97 (!) (P) 149/82  Pulse: 98 (!) 101 (!) 108 (P) 94  Resp: 15 18 18  (P) 17  Temp: 98.1 F (36.7 C) 98.4 F (36.9 C) 98.5 F (36.9 C) (P) 98.5 F (36.9 C)  TempSrc: Oral Oral Oral (P) Oral  SpO2: 98% 98% 99% (P) 99%  Weight:      Height:       Supplemental O2: Room Air SpO2: (P) 99 % O2 Flow Rate (L/min): 25 L/min  Physical Exam Vitals and nursing note reviewed.  Constitutional:      General: He is not in acute distress.    Appearance: Normal appearance. He is not ill-appearing or toxic-appearing.  Cardiovascular:     Rate and Rhythm: Normal rate and regular rhythm.     Pulses: Normal pulses.     Heart sounds: Normal heart sounds. No murmur heard. No gallop.   Pulmonary:     Effort: Pulmonary effort is normal. No respiratory distress.     Breath sounds: Normal breath sounds. No stridor. No wheezing, rhonchi or rales.  Abdominal:     Palpations: Abdomen is soft.  Musculoskeletal:     Cervical back: Neck supple.  Skin:    General: Skin is warm and dry.  Neurological:     Mental Status: He is alert and oriented to person, place, and time. Mental status is at baseline.  Psychiatric:        Mood and Affect: Mood normal.        Behavior: Behavior normal.     Filed Weights   06/01/20 0731 06/01/20 1055 06/02/20 0500  Weight: (!) 156.8 kg (!) 154.8 kg (!) 158.3 kg     Intake/Output Summary (Last 24 hours) at 06/05/2020 0642 Last data filed at 06/04/2020 1300 Gross  per 24 hour  Intake 240 ml  Output 0 ml  Net 240 ml   Net IO Since Admission: -22,570.45 mL [06/05/20 0642]  Pertinent Labs: CBC Latest Ref Rng & Units 06/03/2020 06/01/2020 05/30/2020  WBC 4.0 - 10.5 K/uL 10.4 9.9 10.0  Hemoglobin 13.0 - 17.0 g/dL 9.5(L) 9.4(L) 9.5(L)  Hematocrit 39.0 - 52.0 % 30.4(L) 30.3(L) 30.7(L)  Platelets 150 - 400 K/uL 269 252 207    CMP Latest Ref Rng & Units 06/04/2020 06/03/2020 06/02/2020  Glucose 70 - 99 mg/dL 69(L) 95 154(H)  BUN 6 - 20 mg/dL 48(H) 39(H) 28(H)  Creatinine 0.61 - 1.24 mg/dL 7.64(H) 6.06(H) 4.83(H)  Sodium 135 - 145 mmol/L 135 136 137  Potassium 3.5 - 5.1 mmol/L 4.6 4.2 4.2  Chloride 98 - 111 mmol/L 96(L) 99 98  CO2 22 - 32 mmol/L 23 24 28   Calcium 8.9 - 10.3 mg/dL 9.0 9.0 8.5(L)  Total Protein 6.5 - 8.1 g/dL - - -  Total Bilirubin 0.3 - 1.2 mg/dL - - -  Alkaline Phos 38 - 126 U/L - - -  AST 15 - 41 U/L - - -  ALT 0 - 44 U/L - - -  Imaging: No results found.  Assessment/Plan:   Principal Problem:   Sacral osteomyelitis (HCC) Active Problems:   Insulin dependent type 2 diabetes mellitus (HCC)   HTN (hypertension)   History of anemia due to chronic kidney disease   Pressure injury of both heels, unstageable ([place])   Pneumonia due to COVID-19 virus   ESRD (end stage renal disease) (HCC)   Avascular necrosis of first metatarsal, left foot (HCC)   Osteomyelitis of dorsal first metatarsal, left foot ([place])   Decubitus ulcer of sacral region, unstageable ([place])   Morbid obesity ([place])  Patient Summary: Mr. Stumph is 50yo male with ESRD, hypertension, insulin-depending type II diabetes mellitus admitted 04/29/20 for sacral decubitus ulcer and COVID-19 pneumonia, continuing to progress well on IV antibiotics, awaiting CIR bed placement.   #Unstageable sacral decubitus ulcer #Coccygeal osteomyelitis Remains on long term antibiotics.Stable and awaiting CIR placement. 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 Sharol Given for progression of dry gangrene of left 4th toe. Per patient, Dr. Sharol Given would like him to follow up on outpatient basis. - follow up with ortho outpatient - C/w antibiotics - C/w wound care, Prevalon boots  #Hx of Hypertension #Orthostatic Improvement in orthostatics when working with physical therapy. Believe removing less volume during dialysis yesterday and improvement of diarrhea helped with this.  Goal will be for patient to be hypertensive in hopes of not being orthostatic. Will continue to work with nephrology concerning this. Discontinued home anti-hypertensives.  -continue dialysis per nephro -abdominal belt, SCD during PT sessions. Both items in patient's room -continue to hold amlodipine and metoprolol. Restart once patient no longer hypotensive   #Diarrhea Improvement of diarrhea with scheduled immodium. Max daily dose of 16 mg. Uncertain if renvela was contributing will leave to nephro if they would like to restart for his phosphate levels. -continue scheduled imodium -hold renvela -if continues to have diarrhea, will consider stool panel/enteric precautions.   #Deconditioning #Generalized weakness PT continuing to work with patient on deconditioning and weakness - Continue feeding supplements for nutrition - Continue to move patient to chair twice daily - PT/OT - Pending CIR placement  #ESRD on HD TTS dialysis schedule. Dialysis yesterday, off schedule due to high volume of dialysis patient's in the hospital. - Nephrology following, appreciate their recommendations. - Daily BMP  Hx of Type II DM Glucose have improved  overall - SSI with meals - nightly lantus 14 U  Diet: Carb-Modified IVF: None,None VTE: Heparin Code: Full PT/OT recs: CIR  Dispo: Anticipated discharge to Skilled nursing facility pending CIR bed availability.  Sanjuana Letters DO Internal Medicine Resident PGY-2 Pager [phone] Please contact the on call pager after 5 pm and on weekends at [phone].

## 2020-06-06 ENCOUNTER — Other Ambulatory Visit: Payer: Self-pay

## 2020-06-06 ENCOUNTER — Encounter (HOSPITAL_COMMUNITY): Payer: Self-pay | Admitting: Physical Medicine and Rehabilitation

## 2020-06-06 ENCOUNTER — Inpatient Hospital Stay (HOSPITAL_COMMUNITY)
Admission: RE | Admit: 2020-06-06 | Discharge: 2020-07-16 | DRG: 091 | Disposition: A | Discharge status: Skilled Nursing Facility | Payer: Medicare Other | Source: Intra-hospital | Attending: Physical Medicine and Rehabilitation | Admitting: Physical Medicine and Rehabilitation

## 2020-06-06 DIAGNOSIS — I951 Orthostatic hypotension: Secondary | ICD-10-CM | POA: Diagnosis not present

## 2020-06-06 DIAGNOSIS — R0600 Dyspnea, unspecified: Secondary | ICD-10-CM

## 2020-06-06 DIAGNOSIS — E1152 Type 2 diabetes mellitus with diabetic peripheral angiopathy with gangrene: Secondary | ICD-10-CM | POA: Diagnosis present

## 2020-06-06 DIAGNOSIS — A0472 Enterocolitis due to Clostridium difficile, not specified as recurrent: Secondary | ICD-10-CM | POA: Diagnosis not present

## 2020-06-06 DIAGNOSIS — E1122 Type 2 diabetes mellitus with diabetic chronic kidney disease: Secondary | ICD-10-CM | POA: Diagnosis present

## 2020-06-06 DIAGNOSIS — R7309 Other abnormal glucose: Secondary | ICD-10-CM

## 2020-06-06 DIAGNOSIS — I120 Hypertensive chronic kidney disease with stage 5 chronic kidney disease or end stage renal disease: Secondary | ICD-10-CM | POA: Diagnosis present

## 2020-06-06 DIAGNOSIS — L89150 Pressure ulcer of sacral region, unstageable: Secondary | ICD-10-CM | POA: Diagnosis present

## 2020-06-06 DIAGNOSIS — Z9119 Patient's noncompliance with other medical treatment and regimen: Secondary | ICD-10-CM | POA: Diagnosis not present

## 2020-06-06 DIAGNOSIS — K3184 Gastroparesis: Secondary | ICD-10-CM | POA: Diagnosis present

## 2020-06-06 DIAGNOSIS — Z8619 Personal history of other infectious and parasitic diseases: Secondary | ICD-10-CM

## 2020-06-06 DIAGNOSIS — T827XXA Infection and inflammatory reaction due to other cardiac and vascular devices, implants and grafts, initial encounter: Secondary | ICD-10-CM | POA: Diagnosis not present

## 2020-06-06 DIAGNOSIS — E1143 Type 2 diabetes mellitus with diabetic autonomic (poly)neuropathy: Secondary | ICD-10-CM | POA: Diagnosis present

## 2020-06-06 DIAGNOSIS — K567 Ileus, unspecified: Secondary | ICD-10-CM | POA: Diagnosis not present

## 2020-06-06 DIAGNOSIS — N2581 Secondary hyperparathyroidism of renal origin: Secondary | ICD-10-CM | POA: Diagnosis present

## 2020-06-06 DIAGNOSIS — E46 Unspecified protein-calorie malnutrition: Secondary | ICD-10-CM | POA: Diagnosis not present

## 2020-06-06 DIAGNOSIS — D638 Anemia in other chronic diseases classified elsewhere: Secondary | ICD-10-CM

## 2020-06-06 DIAGNOSIS — Z794 Long term (current) use of insulin: Secondary | ICD-10-CM

## 2020-06-06 DIAGNOSIS — L97529 Non-pressure chronic ulcer of other part of left foot with unspecified severity: Secondary | ICD-10-CM | POA: Diagnosis present

## 2020-06-06 DIAGNOSIS — D631 Anemia in chronic kidney disease: Secondary | ICD-10-CM | POA: Diagnosis present

## 2020-06-06 DIAGNOSIS — Y828 Other medical devices associated with adverse incidents: Secondary | ICD-10-CM | POA: Diagnosis not present

## 2020-06-06 DIAGNOSIS — A419 Sepsis, unspecified organism: Secondary | ICD-10-CM | POA: Diagnosis not present

## 2020-06-06 DIAGNOSIS — E1142 Type 2 diabetes mellitus with diabetic polyneuropathy: Secondary | ICD-10-CM

## 2020-06-06 DIAGNOSIS — K5900 Constipation, unspecified: Secondary | ICD-10-CM

## 2020-06-06 DIAGNOSIS — I10 Essential (primary) hypertension: Secondary | ICD-10-CM

## 2020-06-06 DIAGNOSIS — L89890 Pressure ulcer of other site, unstageable: Secondary | ICD-10-CM | POA: Diagnosis present

## 2020-06-06 DIAGNOSIS — U099 Post covid-19 condition, unspecified: Secondary | ICD-10-CM | POA: Diagnosis present

## 2020-06-06 DIAGNOSIS — E1169 Type 2 diabetes mellitus with other specified complication: Secondary | ICD-10-CM | POA: Diagnosis present

## 2020-06-06 DIAGNOSIS — E11621 Type 2 diabetes mellitus with foot ulcer: Secondary | ICD-10-CM | POA: Diagnosis present

## 2020-06-06 DIAGNOSIS — R509 Fever, unspecified: Secondary | ICD-10-CM

## 2020-06-06 DIAGNOSIS — R058 Other specified cough: Secondary | ICD-10-CM

## 2020-06-06 DIAGNOSIS — R197 Diarrhea, unspecified: Secondary | ICD-10-CM

## 2020-06-06 DIAGNOSIS — Z20822 Contact with and (suspected) exposure to covid-19: Secondary | ICD-10-CM | POA: Diagnosis present

## 2020-06-06 DIAGNOSIS — Z992 Dependence on renal dialysis: Secondary | ICD-10-CM | POA: Diagnosis not present

## 2020-06-06 DIAGNOSIS — R112 Nausea with vomiting, unspecified: Secondary | ICD-10-CM

## 2020-06-06 DIAGNOSIS — L89620 Pressure ulcer of left heel, unstageable: Secondary | ICD-10-CM | POA: Diagnosis present

## 2020-06-06 DIAGNOSIS — E669 Obesity, unspecified: Secondary | ICD-10-CM | POA: Diagnosis present

## 2020-06-06 DIAGNOSIS — G7281 Critical illness myopathy: Principal | ICD-10-CM

## 2020-06-06 DIAGNOSIS — N186 End stage renal disease: Secondary | ICD-10-CM | POA: Diagnosis present

## 2020-06-06 DIAGNOSIS — R159 Full incontinence of feces: Secondary | ICD-10-CM | POA: Diagnosis not present

## 2020-06-06 DIAGNOSIS — F32A Depression, unspecified: Secondary | ICD-10-CM | POA: Diagnosis present

## 2020-06-06 DIAGNOSIS — Z6838 Body mass index (BMI) 38.0-38.9, adult: Secondary | ICD-10-CM

## 2020-06-06 DIAGNOSIS — R34 Anuria and oliguria: Secondary | ICD-10-CM | POA: Diagnosis not present

## 2020-06-06 DIAGNOSIS — M4628 Osteomyelitis of vertebra, sacral and sacrococcygeal region: Secondary | ICD-10-CM | POA: Diagnosis present

## 2020-06-06 DIAGNOSIS — L89610 Pressure ulcer of right heel, unstageable: Secondary | ICD-10-CM | POA: Diagnosis present

## 2020-06-06 DIAGNOSIS — R195 Other fecal abnormalities: Secondary | ICD-10-CM | POA: Diagnosis not present

## 2020-06-06 DIAGNOSIS — J9811 Atelectasis: Secondary | ICD-10-CM | POA: Diagnosis not present

## 2020-06-06 DIAGNOSIS — M898X9 Other specified disorders of bone, unspecified site: Secondary | ICD-10-CM | POA: Diagnosis present

## 2020-06-06 DIAGNOSIS — Z8701 Personal history of pneumonia (recurrent): Secondary | ICD-10-CM

## 2020-06-06 LAB — RENAL FUNCTION PANEL
Albumin: 1.9 g/dL — ABNORMAL LOW (ref 3.5–5.0)
Anion gap: 12 (ref 5–15)
BUN: 33 mg/dL — ABNORMAL HIGH (ref 6–20)
CO2: 26 mmol/L (ref 22–32)
Calcium: 8.6 mg/dL — ABNORMAL LOW (ref 8.9–10.3)
Chloride: 97 mmol/L — ABNORMAL LOW (ref 98–111)
Creatinine, Ser: 4.91 mg/dL — ABNORMAL HIGH (ref 0.61–1.24)
GFR, Estimated: 14 mL/min — ABNORMAL LOW (ref >60)
Glucose, Bld: 129 mg/dL — ABNORMAL HIGH (ref 70–99)
Phosphorus: 5.6 mg/dL — ABNORMAL HIGH (ref 2.5–4.6)
Potassium: 4.4 mmol/L (ref 3.5–5.1)
Sodium: 135 mmol/L (ref 135–145)

## 2020-06-06 LAB — GLUCOSE, CAPILLARY
Glucose-Capillary: 134 mg/dL — ABNORMAL HIGH (ref 70–99)
Glucose-Capillary: 160 mg/dL — ABNORMAL HIGH (ref 70–99)
Glucose-Capillary: 122 mg/dL — ABNORMAL HIGH (ref 70–99)
Glucose-Capillary: 116 mg/dL — ABNORMAL HIGH (ref 70–99)

## 2020-06-06 LAB — CBC
HCT: 29.5 % — ABNORMAL LOW (ref 39.0–52.0)
Hemoglobin: 8.8 g/dL — ABNORMAL LOW (ref 13.0–17.0)
MCH: 26.3 pg (ref 26.0–34.0)
MCHC: 29.8 g/dL — ABNORMAL LOW (ref 30.0–36.0)
MCV: 88.3 fL (ref 80.0–100.0)
Platelets: 240 10*3/uL (ref 150–400)
RBC: 3.34 MIL/uL — ABNORMAL LOW (ref 4.22–5.81)
RDW: 17 % — ABNORMAL HIGH (ref 11.5–15.5)
WBC: 8.1 10*3/uL (ref 4.0–10.5)
nRBC: 0 % (ref 0.0–0.2)

## 2020-06-06 LAB — BASIC METABOLIC PANEL
Anion gap: 9 (ref 5–15)
BUN: 20 mg/dL (ref 6–20)
CO2: 29 mmol/L (ref 22–32)
Calcium: 8.1 mg/dL — ABNORMAL LOW (ref 8.9–10.3)
Chloride: 98 mmol/L (ref 98–111)
Creatinine, Ser: 4.05 mg/dL — ABNORMAL HIGH (ref 0.61–1.24)
GFR, Estimated: 17 mL/min — ABNORMAL LOW (ref >60)
Glucose, Bld: 143 mg/dL — ABNORMAL HIGH (ref 70–99)
Potassium: 3.7 mmol/L (ref 3.5–5.1)
Sodium: 136 mmol/L (ref 135–145)

## 2020-06-06 MED ORDER — ONDANSETRON HCL 4 MG PO TABS: 4.0000 mg | ORAL_TABLET | Freq: Four times a day (QID) | ORAL | 0 refills | Status: DC | PRN

## 2020-06-06 MED ORDER — ONDANSETRON HCL 4 MG/2ML IJ SOLN
4.0000 mg | Freq: Four times a day (QID) | INTRAMUSCULAR | Status: DC | PRN
  Administered 2020-06-10 – 2020-07-11 (×5): 4 mg via INTRAVENOUS
  Filled 2020-06-06 (×7): qty 2

## 2020-06-06 MED ORDER — SODIUM CHLORIDE 0.9 % IV SOLN
2.0000 g | INTRAVENOUS | Status: DC
  Filled 2020-06-06: qty 2

## 2020-06-06 MED ORDER — ACETAMINOPHEN 325 MG PO TABS
650.0000 mg | ORAL_TABLET | Freq: Four times a day (QID) | ORAL | Status: DC | PRN
  Administered 2020-06-07 – 2020-07-11 (×5): 650 mg via ORAL
  Filled 2020-06-06 (×6): qty 2

## 2020-06-06 MED ORDER — INSULIN ASPART 100 UNIT/ML ~~LOC~~ SOLN: 0.0000 [IU] | Freq: Three times a day (TID) | SUBCUTANEOUS | 11 refills | Status: DC

## 2020-06-06 MED ORDER — INSULIN ASPART 100 UNIT/ML ~~LOC~~ SOLN
0.0000 [IU] | Freq: Three times a day (TID) | SUBCUTANEOUS | Status: DC
  Administered 2020-06-06 – 2020-06-07 (×3): 1 [IU] via SUBCUTANEOUS
  Administered 2020-06-13: 2 [IU] via SUBCUTANEOUS
  Administered 2020-06-14 (×2): 1 [IU] via SUBCUTANEOUS
  Administered 2020-06-15: 2 [IU] via SUBCUTANEOUS
  Administered 2020-06-16 (×2): 1 [IU] via SUBCUTANEOUS
  Administered 2020-06-16: 3 [IU] via SUBCUTANEOUS
  Administered 2020-06-17: 1 [IU] via SUBCUTANEOUS
  Administered 2020-06-17: 3 [IU] via SUBCUTANEOUS
  Administered 2020-06-18: 2 [IU] via SUBCUTANEOUS
  Administered 2020-06-18: 1 [IU] via SUBCUTANEOUS
  Administered 2020-06-19 – 2020-06-20 (×2): 2 [IU] via SUBCUTANEOUS
  Administered 2020-06-20 – 2020-06-21 (×2): 1 [IU] via SUBCUTANEOUS
  Administered 2020-06-21: 3 [IU] via SUBCUTANEOUS
  Administered 2020-06-22 – 2020-06-25 (×4): 2 [IU] via SUBCUTANEOUS
  Administered 2020-06-26: 1 [IU] via SUBCUTANEOUS
  Administered 2020-06-26: 2 [IU] via SUBCUTANEOUS
  Administered 2020-06-27 – 2020-06-28 (×4): 1 [IU] via SUBCUTANEOUS
  Administered 2020-06-30: 2 [IU] via SUBCUTANEOUS
  Administered 2020-07-01 – 2020-07-02 (×2): 1 [IU] via SUBCUTANEOUS
  Administered 2020-07-02 – 2020-07-04 (×3): 2 [IU] via SUBCUTANEOUS
  Administered 2020-07-05: 1 [IU] via SUBCUTANEOUS
  Administered 2020-07-05: 2 [IU] via SUBCUTANEOUS
  Administered 2020-07-11 – 2020-07-15 (×4): 1 [IU] via SUBCUTANEOUS

## 2020-06-06 MED ORDER — INSULIN GLARGINE 100 UNIT/ML ~~LOC~~ SOLN: 14.0000 [IU] | Freq: Every day | SUBCUTANEOUS | 11 refills | Status: DC

## 2020-06-06 MED ORDER — NEPRO/CARBSTEADY PO LIQD: 237.0000 mL | Freq: Two times a day (BID) | ORAL | 30 refills | Status: DC

## 2020-06-06 MED ORDER — VANCOMYCIN HCL IN DEXTROSE 1-5 GM/200ML-% IV SOLN
1000.0000 mg | INTRAVENOUS | Status: AC
  Administered 2020-06-10: 1000 mg via INTRAVENOUS
  Filled 2020-06-06 (×2): qty 200

## 2020-06-06 MED ORDER — INSULIN ASPART 100 UNIT/ML ~~LOC~~ SOLN
0.0000 [IU] | Freq: Every day | SUBCUTANEOUS | 11 refills | Status: DC
Start: 2020-06-06 — End: 2020-07-16

## 2020-06-06 MED ORDER — ONDANSETRON HCL 4 MG PO TABS
4.0000 mg | ORAL_TABLET | Freq: Four times a day (QID) | ORAL | Status: DC | PRN
  Administered 2020-06-09 – 2020-07-12 (×7): 4 mg via ORAL
  Filled 2020-06-06 (×10): qty 1

## 2020-06-06 MED ORDER — NEPRO/CARBSTEADY PO LIQD
237.0000 mL | Freq: Two times a day (BID) | ORAL | Status: DC
  Administered 2020-06-07 – 2020-06-30 (×27): 237 mL via ORAL

## 2020-06-06 MED ORDER — DARBEPOETIN ALFA 200 MCG/0.4ML IJ SOSY
200.0000 ug | PREFILLED_SYRINGE | INTRAMUSCULAR | Status: DC
  Filled 2020-06-06: qty 0.4

## 2020-06-06 MED ORDER — PROSOURCE PLUS PO LIQD
30.0000 mL | Freq: Three times a day (TID) | ORAL | Status: DC
  Administered 2020-06-06 – 2020-07-05 (×62): 30 mL via ORAL
  Filled 2020-06-06 (×72): qty 30

## 2020-06-06 MED ORDER — DARBEPOETIN ALFA 200 MCG/0.4ML IJ SOSY
200.0000 ug | PREFILLED_SYRINGE | INTRAMUSCULAR | Status: DC
Start: 2020-06-06 — End: 2020-06-07
  Administered 2020-06-06: 200 ug via SUBCUTANEOUS
  Filled 2020-06-06: qty 0.4

## 2020-06-06 MED ORDER — METOCLOPRAMIDE HCL 5 MG PO TABS
5.0000 mg | ORAL_TABLET | Freq: Three times a day (TID) | ORAL | Status: DC
  Administered 2020-06-11 – 2020-06-27 (×46): 5 mg via ORAL
  Filled 2020-06-06 (×54): qty 1

## 2020-06-06 MED ORDER — RENA-VITE PO TABS
1.0000 | ORAL_TABLET | Freq: Every day | ORAL | Status: DC
  Administered 2020-06-06 – 2020-07-14 (×38): 1 via ORAL
  Filled 2020-06-06 (×40): qty 1

## 2020-06-06 MED ORDER — LOPERAMIDE HCL 2 MG PO CAPS
2.0000 mg | ORAL_CAPSULE | ORAL | Status: DC
  Administered 2020-06-06 – 2020-06-08 (×8): 2 mg via ORAL
  Filled 2020-06-06 (×9): qty 1

## 2020-06-06 MED ORDER — HEPARIN SODIUM (PORCINE) 10000 UNIT/ML IJ SOLN
7500.0000 [IU] | Freq: Three times a day (TID) | INTRAMUSCULAR | Status: DC
  Administered 2020-06-06 – 2020-07-16 (×111): 7500 [IU] via SUBCUTANEOUS
  Filled 2020-06-06 (×124): qty 1

## 2020-06-06 MED ORDER — SODIUM CHLORIDE 0.9 % IV SOLN: 2.0000 g | INTRAVENOUS | 0 refills | Status: DC

## 2020-06-06 MED ORDER — AMLODIPINE BESYLATE 2.5 MG PO TABS: 2.5000 mg | ORAL_TABLET | Freq: Every day | ORAL | Status: DC

## 2020-06-06 MED ORDER — FAMOTIDINE 20 MG PO TABS
20.0000 mg | ORAL_TABLET | Freq: Every day | ORAL | Status: DC
  Administered 2020-06-06 – 2020-07-04 (×29): 20 mg via ORAL
  Filled 2020-06-06 (×30): qty 1

## 2020-06-06 MED ORDER — INSULIN GLARGINE 100 UNIT/ML ~~LOC~~ SOLN
14.0000 [IU] | Freq: Every day | SUBCUTANEOUS | Status: DC
  Administered 2020-06-06 – 2020-06-07 (×2): 14 [IU] via SUBCUTANEOUS
  Filled 2020-06-06 (×3): qty 0.14

## 2020-06-06 MED ORDER — LOPERAMIDE HCL 2 MG PO CAPS: 2.0000 mg | ORAL_CAPSULE | ORAL | 0 refills | Status: DC

## 2020-06-06 MED ORDER — VANCOMYCIN HCL IN DEXTROSE 1-5 GM/200ML-% IV SOLN: 1000.0000 mg | INTRAVENOUS | 0 refills | Status: DC

## 2020-06-06 NOTE — Progress Notes (Signed)
HD#38 Subjective:   No acute events overnight.  Patient seen and evaluated at bedside. Patient states he is doing well. He did not have any diarrhea ON. He has no complaints this AM.    Objective:  Vital signs in last 24 hours: Vitals:   06/05/20 1828 06/05/20 1831 06/05/20 2004 06/06/20 0451  BP: (!) 150/113 (!) 161/96 126/90 (!) 160/92  Pulse: (!) 101 97 98 92  Resp: 18  17 17   Temp: 98.4 F (36.9 C)  98.2 F (36.8 C) 98.2 F (36.8 C)  TempSrc: Oral     SpO2: 100%  99% 97%  Weight:      Height:       Physical Exam Constitutional:      General: He is not in acute distress.    Appearance: He is well-developed.  Cardiovascular:     Rate and Rhythm: Normal rate and regular rhythm.     Heart sounds: No murmur heard. No friction rub. No gallop.   Pulmonary:     Effort: Pulmonary effort is normal.     Breath sounds: Normal breath sounds. No decreased breath sounds, wheezing, rhonchi or rales.  Abdominal:     General: Bowel sounds are normal.     Palpations: Abdomen is soft.  Neurological:     Mental Status: He is alert.     Supplemental O2: Room Air SpO2: 97 % O2 Flow Rate (L/min): 25 L/min   Filed Weights   06/02/20 0500 06/05/20 1429 06/05/20 1740  Weight: (!) 158.3 kg (!) 161.9 kg (!) 161.9 kg     Intake/Output Summary (Last 24 hours) at 06/06/2020 0623 Last data filed at 06/05/2020 1740 Gross per 24 hour  Intake 240 ml  Output 1 ml  Net 239 ml   Net IO Since Admission: -22,331.45 mL [06/06/20 0623]  Pertinent Labs: CBC Latest Ref Rng & Units 06/06/2020 06/05/2020 06/03/2020  WBC 4.0 - 10.5 K/uL 8.1 9.0 10.4  Hemoglobin 13.0 - 17.0 g/dL 8.8(L) 9.5(L) 9.5(L)  Hematocrit 39.0 - 52.0 % 29.5(L) 31.5(L) 30.4(L)  Platelets 150 - 400 K/uL 240 245 269    CMP Latest Ref Rng & Units 06/06/2020 06/05/2020 06/04/2020  Glucose 70 - 99 mg/dL 143(H) 115(H) 69(L)  BUN 6 - 20 mg/dL 20 32(H) 48(H)  Creatinine 0.61 - 1.24 mg/dL 4.05(H) 5.90(H) 7.64(H)  Sodium 135 -  145 mmol/L 136 136 135  Potassium 3.5 - 5.1 mmol/L 3.7 4.4 4.6  Chloride 98 - 111 mmol/L 98 99 96(L)  CO2 22 - 32 mmol/L 29 25 23   Calcium 8.9 - 10.3 mg/dL 8.1(L) 8.6(L) 9.0  Total Protein 6.5 - 8.1 g/dL - - -  Total Bilirubin 0.3 - 1.2 mg/dL - - -  Alkaline Phos 38 - 126 U/L - - -  AST 15 - 41 U/L - - -  ALT 0 - 44 U/L - - -    Imaging: No results found.  Assessment/Plan:   Principal Problem:   Sacral osteomyelitis (HCC) Active Problems:   Insulin dependent type 2 diabetes mellitus (HCC)   HTN (hypertension)   History of anemia due to chronic kidney disease   Pressure injury of both heels, unstageable ([place])   Pneumonia due to COVID-19 virus   ESRD (end stage renal disease) (HCC)   Avascular necrosis of first metatarsal, left foot (HCC)   Osteomyelitis of dorsal first metatarsal, left foot ([place])   Decubitus ulcer of sacral region, unstageable ([place])   Morbid obesity ([place])  Patient Summary: Mr.  Cisar is 50yo male with ESRD, hypertension, insulin-depending type II diabetes mellitus admitted 04/29/20 for sacral decubitus ulcer and COVID-19 pneumonia, continuing to progress well on IV antibiotics, awaiting CIR bed placement.   #Unstageable sacral decubitus ulcer #Coccygeal osteomyelitis Remains on long term antibiotics.Stable and awaiting CIR placement. 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 Sharol Given for progression of dry gangrene of left 4th toe. Per patient, Dr. Sharol Given would like him to follow up on outpatient basis. - follow up with ortho outpatient - C/w antibiotics - C/w wound care, Prevalon boots  #Hx of  Hypertension #Orthostatic Improvement in orthostatics when working with physical therapy. Believe removing less volume during dialysis yesterday and improvement of diarrhea helped with this.  Goal will be for patient to be hypertensive in hopes of not being orthostatic. Will continue to work with nephrology concerning this. Discontinued home anti-hypertensives.  -continue dialysis per nephro -abdominal belt, SCD during PT sessions. Both items in patient's room -continue to hold amlodipine and metoprolol. Restart once patient no longer hypotensive   #Diarrhea No episodes ON.  -continue scheduled imodium -holding renvela  #Deconditioning #Generalized weakness PT continuing to work with patient on deconditioning and weakness - Continue feeding supplements for nutrition - Continue to move patient to chair twice daily - PT/OT - Pending CIR placement  #ESRD on HD TTS dialysis schedule. Dialysis yesterday, off schedule due to high volume of dialysis patient's in the hospital. - Nephrology following, appreciate their recommendations. - Daily BMP  Hx of Type II DM Glucose have improved overall - SSI with meals - nightly lantus 14 U  Diet: Carb-Modified IVF: None,None VTE: Heparin Code: Full PT/OT recs: CIR  Dispo: Anticipated discharge to Skilled nursing facility pending CIR bed availability.  Maudie Mercury, MD Internal Medicine Resident PGY-2 Pager [phone] Please contact the on call pager after 5 pm and on weekends at [phone].

## 2020-06-06 NOTE — Discharge Instructions (Addendum)
To [name],   It was a pleasure taking care of you during your hospital stay. Please continue to take your medications as directed for your wounds.

## 2020-06-06 NOTE — Progress Notes (Signed)
Inpatient Rehab Admissions Coordinator:   I have a CIR bed for this Pt. Today. Will plan to move Pt. To CIR today. RN may call report to [phone].  Clemens Catholic, [place], [place] Admissions Coordinator  [phone] ([place]) [phone] (office)

## 2020-06-06 NOTE — H&P (Addendum)
Physical Medicine and Rehabilitation Admission H&P  CC: Critical illness myopathy  HPI: Fred [name] is a 50 year old right-handed male with history of hypertension, diabetes mellitus, obesity with BMI 38.37, end-stage renal disease with hemodialysis Tuesday Thursday Saturday at [facility] and reported medical noncompliance with hypertensive medications.  Patient with a long complicated history.  Prior to patient's initial hospital admission he lived with his 48 year old father in [place] independent prior to admission.  1 level home with ramped entrance.  Presented 04/29/2020 from [facility] skilled nursing facility where he had been for an extended time for events from COVID-19 testing + 04/20/2020 and issues related to his gallbladder and essentially was bedbound developing a sacral decubitus as well as bilateral heel wounds.  On admission patient with increasing shortness of breath as well as low-grade fever 100.9.  Sacral ulcer did have a foul-smelling odor.  Admission chemistries lactic acid 0.9, blood cultures no growth to date, BUN 11, creatinine 4.41, WBC 5800, hemoglobin 7.8, pro calcitonin 1.24.  Chest x-ray showed diffuse bilateral airspace disease correlated with COVID-19 status.  MRI of the left foot area of ulceration with nailbed irregularity at the first digit with findings suggestive of osteomyelitis involving the first digit distal tuft.  No loculated fluid collection or sinus tract and right foot MRI abnormal osseous edema head of the fifth metatarsal suspicious for osteomyelitis.  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  Surgery was consulted in regards to sacral and heel wounds and was receiving hydrotherapy.  Placed on vancomycin and cefepime to be ongoing through 06/10/2020.  Dr. Sharol Given was consulted in regards to unstageable pressure wounds of left lateral foot lateral malleolus  right lateral foot and heel dry gangrene of left fourth toe as well as left first metatarsal and right fifth meta tarsal advised Prevalon boots and would follow as outpatient.  Hemodialysis ongoing as per renal services monitoring closely for bouts of hypotension.  Subcutaneous heparin for DVT prophylaxis.  Due to patient's profound deconditioning multimedical issues he was admitted for a comprehensive rehabilitation program. Currently denies pain. AOx3.   Review of Systems  Constitutional: Positive for fever and malaise/fatigue.  HENT: Negative for hearing loss.   Eyes: Negative for blurred vision and double vision.  Respiratory: Positive for cough, sputum production and shortness of breath.   Cardiovascular: Positive for leg swelling. Negative for chest pain and palpitations.  Gastrointestinal: Positive for constipation. Negative for heartburn, nausea and vomiting.  Genitourinary: Negative for dysuria, flank pain and hematuria.  Musculoskeletal: Positive for joint pain and myalgias.  Skin: Negative for rash.  All other systems reviewed and are negative.  Past Medical History:  Diagnosis Date  . Chronic kidney disease    HD MWF  . Diabetes mellitus without complication ([place])   . Hypertension    Past Surgical History:  Procedure Laterality Date  . CHOLECYSTECTOMY     History reviewed. No pertinent family history. Social History:  reports that he has never smoked. He has never used smokeless tobacco. He reports that he does not drink alcohol and does not use drugs. Allergies:  Allergies  Allergen Reactions  . Atorvastatin Hives    NOT on MAR   Medications Prior to Admission  Medication Sig Dispense Refill  . acetaminophen (TYLENOL) 325 MG tablet Take 650 mg by mouth every 6 (six) hours as needed for mild pain or fever (>101 F).    Derrill Memo ON 06/08/2020]  ceFEPIme 2 g in sodium chloride 0.9 % 100 mL Inject 2 g into the vein every Tuesday, Thursday, and Saturday at 6 PM for 2 days. 2  g 0  . epoetin alfa-epbx (RETACRIT) 62952 UNIT/ML injection Inject 10,000 Units into the skin every Monday, Wednesday, and Friday.    . famotidine (PEPCID) 20 MG tablet Take 20 mg by mouth 2 (two) times daily.    . insulin aspart (NOVOLOG) 100 UNIT/ML injection Inject 0-5 Units into the skin at bedtime. 10 mL 11  . insulin aspart (NOVOLOG) 100 UNIT/ML injection Inject 0-9 Units into the skin 3 (three) times daily with meals. 10 mL 11  . insulin glargine (LANTUS) 100 UNIT/ML injection Inject 0.14 mLs (14 Units total) into the skin at bedtime. 10 mL 11  . iron polysaccharides (NIFEREX) 150 MG capsule Take 150 mg by mouth daily.    Marland Kitchen loperamide (IMODIUM) 2 MG capsule Take 1 capsule (2 mg total) by mouth every 4 (four) hours. 30 capsule 0  . metoCLOPramide (REGLAN) 5 MG tablet Take 5 mg by mouth 3 (three) times daily before meals.    . multivitamin (RENA-VIT) TABS tablet Take 1 tablet by mouth daily.    . Nutritional Supplements (FEEDING SUPPLEMENT, NEPRO CARB STEADY,) LIQD Take 237 mLs by mouth 2 (two) times daily between meals. 237 mL 30  . ondansetron (ZOFRAN) 4 MG tablet Take 1 tablet (4 mg total) by mouth every 6 (six) hours as needed for nausea. 20 tablet 0  . oxyCODONE-acetaminophen (PERCOCET/ROXICET) 5-325 MG tablet Take 1 tablet by mouth every 6 (six) hours as needed for severe pain or moderate pain ((4-6) for up to 5 days).    Derrill Memo ON 06/08/2020] vancomycin (VANCOCIN) 1-5 GM/200ML-% SOLN Inject 200 mLs (1,000 mg total) into the vein Every Tuesday,Thursday,and Saturday with dialysis for 2 days. 200 mL 0    Drug Regimen Review Drug regimen was reviewed and remains appropriate with no significant issues identified  Home: Home Living Family/patient expects to be discharged to:: Skilled nursing facility Living Arrangements: Group Home Additional Comments: [name]   Functional History: Prior Function Level of Independence: Needs assistance Gait / Transfers Assistance Needed: Pt  reports he has not walked on his own in several months but is working with PT to start walking in the parallel bars. ADL's / Homemaking Assistance Needed: Staff assisting with bathing, dressing. He is able to feed himself and brush his teeth per pt report Comments: from snf  Functional Status:  Mobility: Bed Mobility Overal bed mobility: Needs Assistance Bed Mobility: Rolling,Sidelying to Sit,Sit to Sidelying Rolling: Modified independent (Device/Increase time) Sidelying to sit: Min guard Supine to sit: Mod assist,+2 for physical assistance Sit to supine: Min assist Sit to sidelying: Supervision General bed mobility comments: Min A for trunk and advancement of torso to R side using footboard to gain balance. Max A for return to supine due to dropping BP Transfers Overall transfer level: Needs assistance Equipment used: Ambulation equipment used Transfer via Lift Equipment: Stedy Transfers: Sit to/from Stand Sit to Stand: Mod assist,+2 safety/equipment,+2 physical assistance  Lateral/Scoot Transfers: Total assist General transfer comment: see note for orthostatic vitals. Sit to stand x 3 with STEDY and modA+2 Ambulation/Gait Ambulation/Gait assistance:  (NT) General Gait Details: unable  ADL: ADL Overall ADL's : Needs assistance/impaired Eating/Feeding: Set up,Bed level Eating/Feeding Details (indicate cue type and reason): malfunction with bed settings, could not elevate bed for self feeding and unsafe to leave pt sitting EOB to eat. Pt propped up  on elbow for lunch. Grooming: Minimal assistance Upper Body Bathing: Moderate assistance,Bed level Lower Body Bathing: Maximal assistance,Bed level Upper Body Dressing : Minimal assistance,Sitting,Bed level Lower Body Dressing: Maximal assistance,Bed level Lower Body Dressing Details (indicate cue type and reason): Pt able to form figure four position in bed but could not bring feet close enough to don socks. decreased  coordination/control of B LE Toilet Transfer: Maximal assistance,+2 for physical assistance,+2 for safety/equipment Toilet Transfer Details (indicate cue type and reason): in stedy frame Toileting- Clothing Manipulation and Hygiene: Total assistance Toileting - Clothing Manipulation Details (indicate cue type and reason): Total assist for pericare in bed. Functional mobility during ADLs: Maximal assistance,+2 for physical assistance,+2 for safety/equipment (sit <> stand in stedy) General ADL Comments: Continues to be limited by decreased strength, dropping BP with activities  Cognition: Cognition Overall Cognitive Status: Within Functional Limits for tasks assessed Orientation Level: Oriented X4 Cognition Arousal/Alertness: Awake/alert Behavior During Therapy: WFL for tasks assessed/performed,Flat affect Overall Cognitive Status: Within Functional Limits for tasks assessed Area of Impairment: Awareness,Safety/judgement Orientation Level: Disoriented to,Place,Time,Situation Current Attention Level: Selective Memory: Decreased short-term memory Following Commands: Follows one step commands consistently,Follows multi-step commands inconsistently Safety/Judgement: Decreased awareness of safety,Decreased awareness of deficits Awareness: Emergent Problem Solving: Slow processing,Requires verbal cues General Comments: Pt pleasant and motivated to complete all tasks.  Physical Exam: Blood pressure (!) 182/66, pulse 96, temperature 98.4 F (36.9 C), temperature source Oral, resp. rate 16, height 6\' 6"  (1.981 m), weight (!) 158.8 kg. Physical Exam Gen: no distress, normal appearing HEENT: oral mucosa pink and moist, NCAT Cardio: Reg rate Chest: normal effort, normal rate of breathing Abd: soft, non-distended Ext: no edema Psych: pleasant, normal affect Skin:    Comments: Sacral decubitus as well as bilateral heel dressings are in place change routinely per wound care nurse   Neurological:     Comments: Patient is alert in no acute distress.  Complaints of feeling weak. 5/5 strength throughout. Oriented x3.  Follows simple commands.  [place] but really was not sure why he was in the nursing home in the first place     Results for orders placed or performed during the hospital encounter of 06/06/20 (from the past 48 hour(s))  Renal function panel     Status: Abnormal   Collection Time: 06/06/20  2:53 PM  Result Value Ref Range   Sodium 135 135 - 145 mmol/L   Potassium 4.4 3.5 - 5.1 mmol/L   Chloride 97 (L) 98 - 111 mmol/L   CO2 26 22 - 32 mmol/L   Glucose, Bld 129 (H) 70 - 99 mg/dL    Comment: Glucose reference range applies only to samples taken after fasting for at least 8 hours.   BUN 33 (H) 6 - 20 mg/dL   Creatinine, Ser 4.91 (H) 0.61 - 1.24 mg/dL   Calcium 8.6 (L) 8.9 - 10.3 mg/dL   Phosphorus 5.6 (H) 2.5 - 4.6 mg/dL   Albumin 1.9 (L) 3.5 - 5.0 g/dL   GFR, Estimated 14 (L) >60 mL/min    Comment: (NOTE) Calculated using the CKD-EPI Creatinine Equation (2021)    Anion gap 12 5 - 15    Comment: Performed at [facility] [address] [place], [place] [address]   No results found.     Medical Problem List and Plan: 1.  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   -ELOS/Goals: MinA 2-3 weeks. 2.  Antithrombotics: -DVT/anticoagulation: Subcutaneous heparin.  Check vascular study  -antiplatelet therapy: N/A 3. Pain Management: Tylenol as needed 4. Mood: Provide emotional support  -antipsychotic agents: N/A 5. Neuropsych: This patient is capable of making decisions on his own behalf. 6. Skin/Wound Care: Routine skin checks 7. Fluids/Electrolytes/Nutrition: Routine in and outs with follow-up chemistries 8.  End-stage renal disease.  Hemodialysis per renal services 9.  Sacral decubitus ulcer as well as ischemic  ulcers on the left foot fourth toe and fifth metatarsal head and right heel and fifth metatarsal head.  Wound care nurse follow-up.  Patient to see Dr. Sharol Given outpatient.  Continue bilateral PRAFO's. 10.  ID/ coccygeal osteomyelitis.  Continue vancomycin and cefepime through 06/10/2020 10.  Anemia of chronic disease.  Continue Aranesp. Hgb 8.8 on 1/16, repeat with HD 11.  Diabetes mellitus.  Hemoglobin A1c 5.6.  Lantus insulin 14 units nightly  1/16: CBGs 108-167: continue current regimen.  12.  Morbid obesity.  BMI 38.37.  Dietary follow-up   Izora Ribas, MD 06/06/2020

## 2020-06-06 NOTE — Discharge Summary (Addendum)
Name: Fred [name] MRN: [id number] DOB: 04/17/1971 50 y.o. PCP: Pcp, No  Date of Admission: 04/29/2020 11:03 AM Date of Discharge: 06/06/20 Attending Physician: Dr. Angelia Mould  Discharge Diagnosis: Principal Problem:   Sacral osteomyelitis ([place]) Active Problems:   Insulin dependent type 2 diabetes mellitus ([place])   HTN (hypertension)   History of anemia due to chronic kidney disease   Pressure injury of both heels, unstageable ([place])   Pneumonia due to COVID-19 virus   ESRD (end stage renal disease) ([place])   Avascular necrosis of first metatarsal, left foot ([place])   Osteomyelitis of dorsal first metatarsal, left foot ([place])   Decubitus ulcer of sacral region, unstageable ([place])   Morbid obesity ([place])    Discharge Medications: Allergies as of 06/06/2020       Reactions   Atorvastatin Hives   NOT on MAR        Medication List     STOP taking these medications    amLODipine 10 MG tablet Commonly known as: NORVASC   benzonatate 100 MG capsule Commonly known as: TESSALON   Cholecalciferol 25 MCG (1000 UT) tablet   CULTURELLE PO   hydrALAZINE 10 MG tablet Commonly known as: APRESOLINE   insulin lispro 100 UNIT/ML injection Commonly known as: HUMALOG   metoprolol tartrate 50 MG tablet Commonly known as: LOPRESSOR   sodium chloride 1 g tablet   tamsulosin 0.4 MG Caps capsule Commonly known as: FLOMAX       TAKE these medications    acetaminophen 325 MG tablet Commonly known as: TYLENOL Take 650 mg by mouth every 6 (six) hours as needed for mild pain or fever (>101 F).   ceFEPIme 2 g in sodium chloride 0.9 % 100 mL Inject 2 g into the vein every Tuesday, Thursday, and Saturday at 6 PM for 2 days. Start taking on: June 08, 2020   famotidine 20 MG tablet Commonly known as: PEPCID Take 20 mg by mouth 2 (two) times daily.   feeding supplement (NEPRO CARB STEADY) Liqd Take 237 mLs by mouth 2 (two) times daily between meals.   insulin aspart 100  UNIT/ML injection Commonly known as: novoLOG Inject 0-5 Units into the skin at bedtime.   insulin aspart 100 UNIT/ML injection Commonly known as: novoLOG Inject 0-9 Units into the skin 3 (three) times daily with meals.   insulin glargine 100 UNIT/ML injection Commonly known as: LANTUS Inject 0.14 mLs (14 Units total) into the skin at bedtime.   iron polysaccharides 150 MG capsule Commonly known as: NIFEREX Take 150 mg by mouth daily.   loperamide 2 MG capsule Commonly known as: IMODIUM Take 1 capsule (2 mg total) by mouth every 4 (four) hours.   metoCLOPramide 5 MG tablet Commonly known as: REGLAN Take 5 mg by mouth 3 (three) times daily before meals.   multivitamin Tabs tablet Take 1 tablet by mouth daily.   ondansetron 4 MG tablet Commonly known as: ZOFRAN Take 1 tablet (4 mg total) by mouth every 6 (six) hours as needed for nausea.   oxyCODONE-acetaminophen 5-325 MG tablet Commonly known as: PERCOCET/ROXICET Take 1 tablet by mouth every 6 (six) hours as needed for severe pain or moderate pain ((4-6) for up to 5 days).   Retacrit 10000 UNIT/ML injection Generic drug: epoetin alfa-epbx Inject 10,000 Units into the skin every Monday, Wednesday, and Friday.   vancomycin 1-5 GM/200ML-% Soln Commonly known as: VANCOCIN Inject 200 mLs (1,000 mg total) into the vein Every Tuesday,Thursday,and Saturday with dialysis for 2  days. Start taking on: June 08, 2020               Discharge Care Instructions  (From admission, onward)           Start     Ordered   06/06/20 0000  Leave dressing on - Keep it clean, dry, and intact until clinic visit        06/06/20 0847            Disposition and follow-up:   Fred [name] was discharged from [facility] in [place] condition.  At the hospital follow up visit please address:  1.  Follow-up:  A. Sacral Ulcer/Bilateral Foot Wounds    B. ESRD   C. Orthostatic Hypotension   D.  Weakness/Deconditioning  2.  Labs / imaging needed at time of follow-up: BMP's with dialysis  3.  Pending labs/ test needing follow-up: none  4.  Medication Changes  Started: Imodium  Stopped: Amlodipine, Metoprolol  Abx - Vancomycin/Cefepime  End Date: 06/10/20  Follow-up Appointments:  Follow-up Information     Newt Minion, MD Follow up.   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 On admission concern for sepsis due to osteomyelitis, due to fever and tachycardia, patient was started on broad-spectrum antibiotics. Consulted general surgery for sacral wound and orthopedics for his extremity osteomyelitis/gangrene. No indication for surgery. Sacral wound was treated with hydrotherapy and chemical debridement. Wound care for wounds on feet and offloading with air mattresses PRAFO boots. Orthopedics was re-consulted during his hospitalization due to worsening of his left fourth toe ischemic ulcer. Ortho recommended continue PRAFO boots and f/u on outpatient basis  Will need continued wound care after discharge and he has follow up with Dr. Sharol Given of orthopedics.  COVID Pneumonia Patient tested positive on 04/20/2020 for COVID-19 pneumonia. Patient noted to by hypoxic to 80s in ED which improved with 3L [place] with CXR revaling diffuse bilater airspace disease with elevated d-dimer, ferritin, and CRP. Patient remained  fairly asymptomatic through admission, however initially requiring O2 supplementation. Treated with remdesivir and decadron. O2 requirements resolved, and discontinued on decadron. He is past his quarantine period at discharge.   Weakness Deconditioning Patient with Hx of bedrest after multiple hospitalizations and an episode of bedrest in September after he fell while hospitalized at a different institution for sepsis/renal failure. He then developed the ulcers as described above. Since then, patient has had worsening deconditioning and weakness. While hospitalized he worked with PT/OT, their recommendations was a SNF facility.   Orthostatic Hypotension Patient with persistent symptomatic orthostasis that prevented him from progressing with PT/OT during his hospitalization. His anti-hypertensive were held and adjustments were made to his dry weight for his dialysis sessions that ultimately led him to be hypertensive, but with his, he improved. During PT sessions SCD's and abdominal binders were used. He had improvement with these changes and was progressing well at time of discharge. It  is thought that his persistent orthostasis is secondary to his lack of automatic response in the context of his severe diabetic neuropathy.   Diarrhea Patient with initial concern for constipation, so senna was added as well as miralax after his constipation did not improve. After these additions, the patient developed diarrhea episodes. Both senna and miralax were discontinued, he had some improvement so imodium was then added. He was also on Renvela, a phosphate binder, that was discontinued. It does have diarrhea as a possible side effect listed. The imodium was continued at discharge and patient without diarrhea episodes the evening before discharge.   ESRD TTS Schedule Patient continued dialysis sessions and was followed by nephrology while hospitalized. No complications occurred  Type II DM  Patient with Hx of  T2DM requiring insulin. During his hospitalization patient required increasing amounts of nightly and meal time insulin, initially on steroids for his COVID 19 infection and remained needing higher doses once his appetite improved. He will be discharged home on these new doses.   Discharge Vitals:   BP (!) 158/93 (BP Location: Right Arm)   Pulse 97   Temp 98 F (36.7 C)   Resp 16   Ht 6\' 6"  (1.981 m)   Wt (!) 161.9 kg   SpO2 99%   BMI 41.25 kg/m   Pertinent Labs, Studies, and Procedures:  CBC Latest Ref Rng & Units 06/06/2020 06/05/2020 06/03/2020  WBC 4.0 - 10.5 K/uL 8.1 9.0 10.4  Hemoglobin 13.0 - 17.0 g/dL 8.8(L) 9.5(L) 9.5(L)  Hematocrit 39.0 - 52.0 % 29.5(L) 31.5(L) 30.4(L)  Platelets 150 - 400 K/uL 240 245 269    CMP Latest Ref Rng & Units 06/06/2020 06/05/2020 06/04/2020  Glucose 70 - 99 mg/dL 143(H) 115(H) 69(L)  BUN 6 - 20 mg/dL 20 32(H) 48(H)  Creatinine 0.61 - 1.24 mg/dL 4.05(H) 5.90(H) 7.64(H)  Sodium 135 - 145 mmol/L 136 136 135  Potassium 3.5 - 5.1 mmol/L 3.7 4.4 4.6  Chloride 98 - 111 mmol/L 98 99 96(L)  CO2 22 - 32 mmol/L 29 25 23   Calcium 8.9 - 10.3 mg/dL 8.1(L) 8.6(L) 9.0  Total Protein 6.5 - 8.1 g/dL - - -  Total Bilirubin 0.3 - 1.2 mg/dL - - -  Alkaline Phos 38 - 126 U/L - - -  AST 15 - 41 U/L - - -  ALT 0 - 44 U/L - - -    DG Ankle 2 Views Right  Result Date: 04/29/2020 CLINICAL DATA:  Bilateral foot wounds EXAM: RIGHT ANKLE - 2 VIEW COMPARISON:  Right foot series today FINDINGS: Diffuse dense vascular calcifications. No acute bony abnormality. Specifically, no fracture, subluxation, or dislocation. No bone destruction. Soft tissues are intact. Plantar calcaneal spur. IMPRESSION: No acute bony abnormality. Electronically Signed   By: Rolm Baptise M.D.   On: 04/29/2020 13:40   MR PELVIS WO CONTRAST  Result Date: 04/29/2020 CLINICAL DATA:  Osteomyelitis suspected, pelvis, no prior imaging EXAM: MRI PELVIS WITHOUT CONTRAST TECHNIQUE: Multiplanar multisequence  MR imaging of the pelvis was performed. No intravenous contrast was administered. COMPARISON:  None. FINDINGS: Urinary Tract:  No abnormality visualized. Bowel:  Unremarkable visualized pelvic bowel loops. Vascular/Lymphatic: No pathologically enlarged lymph nodes. No significant vascular abnormality seen. Reproductive:  No mass or other significant abnormality Other: Subcutaneous soft tissue hyperintensity of the gluteal subcutaneus soft tissues within associated skin defect chest left of midline at the level of the distal sacrum and coccyx (10:38). Slight hyperintensity of bilateral gluteus maximus musculature. Underlying the ulceration along  the left gluteal soft tissues, there are a couple foci of gas within the musculature and soft tissues. No organized fluid collection. No definite deep fascial edema. Musculoskeletal: Hyperintense first coccyx vertebra on T2 fat saturation (11:35) with vague decreased intensity on T1 image (9:10). IMPRESSION: Findings suspicious for coccygeal osteomyelitis in the presence of overlying soft tissue sacral ulceration and cellulitis. Electronically Signed   By: Iven Finn M.D.   On: 04/29/2020 21:36   DG Pelvis Portable  Result Date: 04/29/2020 CLINICAL DATA:  Sacral wounds EXAM: PORTABLE PELVIS 1-2 VIEWS COMPARISON:  None. FINDINGS: No acute bony abnormality. Specifically, no fracture, subluxation, or dislocation. No bone destruction. Diffuse vascular calcifications. IMPRESSION: No acute bony abnormality. Electronically Signed   By: Rolm Baptise M.D.   On: 04/29/2020 13:43   MR FOOT RIGHT WO CONTRAST  Result Date: 04/30/2020 CLINICAL DATA:  Right foot pain and limping EXAM: MRI OF THE RIGHT FOOT WITHOUT CONTRAST TECHNIQUE: Multiplanar, multisequence MR imaging of the right foot was performed. No intravenous contrast was administered. Osteomyelitis protocol MRI of the foot was obtained, to include the entire foot and ankle. This protocol uses a large field of view to  cover the entire foot and ankle, and is suitable for assessing bony structures for osteomyelitis. Due to the large field of view and imaging plane choice, this protocol is less sensitive for assessing small structures such as ligamentous structures of the foot and ankle, compared to a dedicated forefoot or dedicated hindfoot exam. COMPARISON:  04/29/2020 radiographs FINDINGS: Bones/Joint/Cartilage Abnormal osseous edema in the head of the fifth metatarsal with underlying cutaneous and subcutaneous ulceration, the appearance is highly suspicious for osteomyelitis. There is abnormal edema distally in the proximal phalanx of the small toe. On image 13 of series 7 this appears to probably be due to a transverse fracture of the distal head of the proximal phalanx. Given the proximity to the ulceration and the presume fifth metatarsal osteomyelitis, I cannot completely exclude osteomyelitis involving the proximal phalanx as an alternative cause for the edema and irregularity. Edema distally in the calcaneus adjacent to the calcaneocuboid articulation is thought to be due to arthropathy rather than infection. Degenerative findings at the first MTP joint. Suspected red marrow centrally in the first metatarsal. Ligaments INSERT non Muscles and Tendons Diffuse edema along the plantar musculature of the foot could be from myositis or neurogenic edema. Thickened plantar fascia suggest plantar fasciitis. Soft tissues Focal cutaneous and subcutaneous ulceration along the plantar-lateral margin of the fifth MTP joint. Subcutaneous edema and possible ulceration plantar to the first MTP joint. Dorsal subcutaneous edema in the foot, cellulitis is not excluded. There is also some low-grade plantar subcutaneous edema distally in the forefoot. I do not observe a drainable abscess. IMPRESSION: 1. Abnormal osseous edema in the head of the fifth metatarsal with underlying cutaneous and subcutaneous ulceration, highly suspicious for  osteomyelitis. 2. There is also abnormal edema distally in the proximal phalanx of the small toe, probably from a transverse fracture of the distal head of the proximal phalanx. I cannot completely exclude osteomyelitis involving the proximal phalanx of the small toe given the proximity to the ulceration and the presume cause for the edema and irregularity. 3. Plantar fasciitis. 4. Degenerative findings at the first MTP joint. 5. Diffuse edema along the plantar musculature of the foot could be from myositis or neurogenic edema. 6. Dorsal subcutaneous edema in the foot, cellulitis is not excluded. No drainable abscess. Electronically Signed   By: Cindra Eves.D.  On: 04/30/2020 16:56   MR FOOT LEFT WO CONTRAST  Result Date: 04/29/2020 CLINICAL DATA:  Foot pain, question of infection EXAM: MRI OF THE LEFT FOOT WITHOUT CONTRAST TECHNIQUE: Multiplanar, multisequence MR imaging of the left was performed. No intravenous contrast was administered. COMPARISON:  None. FINDINGS: Bones/Joint/Cartilage There is diffuse periosteal thickening with ankylosis seen at the base of the second through fifth digits. Joint space loss seen at the fourth and fifth metatarsal cuboid joint with subchondral cystic changes. There is a T2 bright/T1 dark serpiginous area seen within the first metatarsal shaft which could represent a vascular necrosis. No area cortical destruction or periosteal reaction is noted. There is also a T2 bright/T1 dark area of signal change seen at the distal tuft of the first digit. Muscles and Tendons Increased signal with fatty atrophy of the muscles is seen. The flexor and extensor tendons are intact. The plantar fascia is intact. Soft tissues Area of superficial ulceration seen the dorsum of the first digit with nailbed irregularity. No loculated fluid collection or sinus tract. IMPRESSION: Area of ulceration with nailbed irregularity at the first digit with findings that are suggestive of  osteomyelitis involving the first digit distal tuft. No loculated fluid collections or sinus tract. Area of signal abnormality in the first metatarsal shaft which could be due to avascular necrosis/reactive marrow. Electronically Signed   By: Prudencio Pair M.D.   On: 04/29/2020 21:50   DG Chest Port 1 View  Result Date: 04/29/2020 CLINICAL DATA:  Question sepsis. EXAM: PORTABLE CHEST 1 VIEW COMPARISON:  None. FINDINGS: Heart size upper normal. Diffuse bilateral airspace disease with patchy airspace disease right greater than left. No effusion. Right jugular dual lumen catheter tip in the SVC at the cavoatrial junction. No pneumothorax IMPRESSION: Diffuse bilateral airspace disease most likely pneumonia. Correlate with COVID-19 status. Electronically Signed   By: Franchot Gallo M.D.   On: 04/29/2020 12:09   DG Foot 2 Views Left  Result Date: 04/29/2020 CLINICAL DATA:  Bilateral foot wounds EXAM: LEFT FOOT - 2 VIEW COMPARISON:  None. FINDINGS: Deformity of the 3rd through 5th metatarsals, likely related to old injury. There appears to be fusion across the 2nd through 5th tarsal metatarsal joints. No acute fracture, subluxation or dislocation. No bone destruction. Diffuse vascular calcifications. Soft tissues intact. IMPRESSION: Deformity of the 2nd through 5th metatarsals with fusion across the 2nd through 5th tarsal metatarsal joints, possibly related to old trauma. No acute bony abnormality. Electronically Signed   By: Rolm Baptise M.D.   On: 04/29/2020 13:42   DG Foot 2 Views Right  Result Date: 04/29/2020 CLINICAL DATA:  Bilateral feet wounds EXAM: RIGHT FOOT - 2 VIEW COMPARISON:  None. FINDINGS: Diffuse dense vascular calcifications. No acute bony abnormality. Specifically, no fracture, subluxation, or dislocation. No bone destruction. Soft tissues are intact. Plantar calcaneal spur. IMPRESSION: No acute bony abnormality. Electronically Signed   By: Rolm Baptise M.D.   On: 04/29/2020 13:40   VAS [place]  ABI WITH/WO TBI  Result Date: 05/02/2020 LOWER EXTREMITY DOPPLER STUDY Indications: Rest pain, and gangrene. High Risk Factors: Hypertension, Diabetes.  Comparison Study: No previous exam Performing Technologist: Vonzell Schlatter RVT  Examination Guidelines: A complete evaluation includes at minimum, Doppler waveform signals and systolic blood pressure reading at the level of bilateral brachial, anterior tibial, and posterior tibial arteries, when vessel segments are accessible. Bilateral testing is considered an integral part of a complete examination. Photoelectric Plethysmograph (PPG) waveforms and toe systolic pressure readings are included as required  and additional duplex testing as needed. Limited examinations for reoccurring indications may be performed as noted.  ABI Findings: +--------+------------------+-----+---------+--------+ Right   Rt Pressure (mmHg)IndexWaveform Comment  +--------+------------------+-----+---------+--------+ [id number]                                      +--------+------------------+-----+---------+--------+ PTA     0                 0.00 absent            +--------+------------------+-----+---------+--------+ DP      253               1.10 triphasic         +--------+------------------+-----+---------+--------+ +--------+------------------+-----+--------+-------+ Left    Lt Pressure (mmHg)IndexWaveformComment +--------+------------------+-----+--------+-------+ [id number]                                    +--------+------------------+-----+--------+-------+ PTA     0                 0.00 absent          +--------+------------------+-----+--------+-------+ DP      183               0.80 biphasic        +--------+------------------+-----+--------+-------+  Summary: Right: Resting right ankle-brachial index is within normal range. No evidence of significant right lower extremity arterial disease. Unable to obtain doppler or waveform  of PTA. Left: Resting left ankle-brachial index indicates mild left lower extremity arterial disease. Unable to obtain doppler or waveform of PTA.  *See table(s) above for measurements and observations.  Electronically signed by Jamelle Haring on 05/02/2020 at 1:09:05 PM.   Final      Discharge Instructions: Discharge Instructions     Call MD for:  persistant nausea and vomiting   Complete by: As directed    Call MD for:  redness, tenderness, or signs of infection (pain, swelling, redness, odor or green/yellow discharge around incision site)   Complete by: As directed    Call MD for:  severe uncontrolled pain   Complete by: As directed    Call MD for:  temperature >100.4   Complete by: As directed    Diet - low sodium heart healthy   Complete by: As directed    Increase activity slowly   Complete by: As directed    Leave dressing on - Keep it clean, dry, and intact until clinic visit   Complete by: As directed        Signed: Riesa Pope, MD 06/06/2020, 11:15 AM   Pager: [phone]

## 2020-06-06 NOTE — Progress Notes (Signed)
Inpatient Rehabilitation Medication Review by a Pharmacist  A complete drug regimen review was completed for this patient to identify any potential clinically significant medication issues.  Clinically significant medication issues were identified:  yes   Type of Medication Issue Identified Description of Issue Urgent (address now) Non-Urgent (address on AM team rounds) Plan Plan Accepted by Provider? (Yes / No / Pending AM Rounds)  Drug Interaction(s) (clinically significant)       Duplicate Therapy       Allergy       No Medication Administration End Date       Incorrect Dose       Additional Drug Therapy Needed  Aranesp due 1/16 but not ordered: - Patient did not receive 1/13 Aranesp dose, per Dr. Jonnie Finner switched to SQ every Saturday due to unstable HD schedule with high hospital census.  - Did not receive 1/15 SQ dose; reordered for SQ every Sunday due to unstable HD schedule - next dose due 1/16 Urgent - dose is due today Secure chat to MD Yes - also notified RN of plan  Other  Per discharge summary: resume home iron polysaccharides (held since hospital admission) Non-urgent Secure chat to PA to suggest resuming when appropriate NA    Name of provider notified for urgent issues identified: Leeroy Cha MD  Provider Method of Notification: Secure chat   For non-urgent medication issues to be resolved on team rounds tomorrow morning a [facility] Secure Chat Handoff was sent to: NA   Pharmacist comments: NA  Time spent performing this drug regimen review (minutes):  [place], PharmD PGY-1 Pharmacy Resident 06/06/2020 2:58 PM Please see AMION for all pharmacy numbers

## 2020-06-06 NOTE — Progress Notes (Addendum)
Subjective: Dialysis yesterday on schedule, no complaints this a.m. noted for transfer to CIR today .  Diarrhea resolved after holding treatments Renvela  Objective Vital signs in last 24 hours: Vitals:   06/05/20 1831 06/05/20 2004 06/06/20 0451 06/06/20 0826  BP: (!) 161/96 126/90 (!) 160/92 (!) 158/93  Pulse: 97 98 92 97  Resp:  17 17 16   Temp:  98.2 F (36.8 C) 98.2 F (36.8 C) 98 F (36.7 C)  TempSrc:      SpO2:  99% 97% 99%  Weight:      Height:       Weight change:    Physical Exam General:Alert , obese chronically ill appearing male, NAD Heart:RRR, no mrg Lungs:CTAB, nml WOB Abdomen:soft BS +, NT, ND Extremities:no LE edema, b/l heels wrapped Dialysis Access: R IJ TDC    Dialysis Orders: NWTTS 4h 400/800 143.5kg 3K/2.5Ca TDCHeparin3000units IV TIW - Mircera144mcgIVq2wks - last 12/8 - Venofer 100mg IVqHD x10 - completed 6/10 doses  Problem/Plan: 1. Sacral decub/coccygealosteo: On Vanc/Cefepime x 6 weeks (thru 06/10/20) - ID following.Continues hydrotherapy.  Today to be transferred to CIR -  2. 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 3. ESRD: on HD TTS.HD was off schedule. Roll over due to high patient census. HD today  per regular schedule.K 4.6. on 1/14  4. Diarrhea - ?antibio effect .- now slowing ,?? Renvela  will hold and see if improves. 5. BP/volume:BP starting to rise up some systolic and diastolic.  had issues with hypotension which improved once amlodipine and metoprolol d/c'd. Keep SBP >100 on HD. Has had problems with orthostatic hypotension not helping rehab,appears euvolemic on exam.+diarrhea,planrun even with HD  let volume come up.  Follow-up trend= restart low-dose amlodipine 2.5 mg at bedtime 6. COVID PNA- tested positive on 11/30, is off isolation now.  7. Anemiaof CKD- last Hgb8.8,cont Aranesp 223mcgq Thursday. 8. Secondary Hyperparathyroidism: CorrCa9.9, phos 5.4  on 1/12  not on VDRA.Hold renvela diarrhea follow-up next phosphorus before starting different binder  ?Fosrenol possibly 9. Nutrition: last Alb 1.8 on 1/11 , continue Nepro + pro-source supplements.On carb modified diet.  With potassium, phosphorus controlled volume stable currently, follow labs. 10. DM T2 - per admit  Ernest Haber, PA-C [facility] Beeper [phone] 06/06/2020,11:01 AM  LOS: 38 days   Labs: Basic Metabolic Panel: Recent Labs  Lab 05/31/20 0814 06/01/20 0757 06/02/20 0200 06/03/20 0236 06/04/20 0328 06/05/20 0901 06/06/20 0159  NA  --  134* 137   < > 135 136 136  K  --  4.3 4.2   < > 4.6 4.4 3.7  CL  --  97* 98   < > 96* 99 98  CO2  --  25 28   < > 23 25 29   GLUCOSE  --  89 154*   < > 69* 115* 143*  BUN  --  43* 28*   < > 48* 32* 20  CREATININE  --  6.47* 4.83*   < > 7.64* 5.90* 4.05*  CALCIUM  --  9.3 8.5*   < > 9.0 8.6* 8.1*  PHOS 5.9* 6.6* 5.4*  --   --   --   --    < > = values in this interval not displayed.   Liver Function Tests: Recent Labs  Lab 06/01/20 0757  ALBUMIN 1.8*   No results for input(s): LIPASE, AMYLASE in the last 168 hours. No results for input(s): AMMONIA in the last 168 hours. CBC: Recent Labs  Lab 06/01/20 0757 06/03/20 0236 06/05/20 1839 06/06/20 0159  WBC 9.9 10.4 9.0 8.1  HGB 9.4* 9.5* 9.5* 8.8*  HCT 30.3* 30.4* 31.5* 29.5*  MCV 86.8 86.1 87.5 88.3  PLT 252 269 245 240   Cardiac Enzymes: No results for input(s): CKTOTAL, CKMB, CKMBINDEX, TROPONINI in the last 168 hours. CBG: Recent Labs  Lab 06/05/20 0634 06/05/20 1140 06/05/20 1825 06/05/20 2003 06/06/20 0647  GLUCAP 116* 182* 108* 167* 134*    Studies/Results: No results found. Medications: . ceFEPime (MAXIPIME) IV 2 g (06/05/20 1844)  . ceFEPime (MAXIPIME) IV    . vancomycin 1,000 mg (06/04/20 1119)   . (feeding supplement) PROSource Plus  30 mL Oral TID BM  . Chlorhexidine Gluconate Cloth  6 each Topical Q0600  . darbepoetin  (ARANESP) injection - NON-DIALYSIS  200 mcg Subcutaneous Q Sat-1800  . famotidine  20 mg Oral QHS  . feeding supplement (NEPRO CARB STEADY)  237 mL Oral BID BM  . heparin injection (subcutaneous)  7,500 Units Subcutaneous Q8H  . insulin aspart  0-5 Units Subcutaneous QHS  . insulin aspart  0-9 Units Subcutaneous TID WC  . insulin glargine  14 Units Subcutaneous QHS  . loperamide  2 mg Oral Q4H  . metoCLOPramide  5 mg Oral TID AC  . multivitamin  1 tablet Oral QHS  . sodium chloride flush  3 mL Intravenous Q12H

## 2020-06-06 NOTE — Progress Notes (Signed)
Patient ID: Fred [name], male   DOB: [birth year], 50 y.o.   MRN: [id number]  Patient arrived and was admitted to inpatient rehab department at 1512 hours patient in good spirits, VSS and no c/o pain able to make needs known. Patient oriented x4 and expressed that he was happy to be off acute. Patient skin assessed by Ronita Hipps LPN and [facility] RN wounds measured and documented on. Patient oriented to the call light and how to call for help if needed and fall risk contract signed by patient and nurse

## 2020-06-06 NOTE — Plan of Care (Signed)
No acute events since the previous night that I took care of him. No diarrhea episodes today per day shift nurse. Still giving Immodium q4h as scheduled. Will continue to monitor and continue current POC.

## 2020-06-06 NOTE — Progress Notes (Signed)
Show:Clear all _0 Manual_1 Template_2 Copied  Added by: _3 Izora Ribas, MD_4 Genella Mech, CCC-SLP_5 Michel Santee, PT   _6 Hover for details  PMR Admission Coordinator Pre-Admission Assessment   Patient: Fred [name] is an 50 y.o., male MRN: [id number] DOB: [birth year] Height: _7  (198.1 cm) Weight: (!) 161.9 kg   Insurance Information HMO:     PPO:      PCP:      IPA:      80/20:      OTHER:  PRIMARY: Medicare A and B      Policy#: [id number]      Subscriber: pt CM Name:       Phone#:      Fax#:  Pre-Cert#: verified online      Employer:  Benefits:  Phone #:      Name:  Eff. Date: 07/20/13 A and B     Deduct: $1484      Out of Pocket Max: n/a      Life Max: n/a CIR: 100%      SNF: 20 full days Outpatient: 80%     Co-Pay: 20% Home Health: 100%      Co-Pay:  DME: 80%     Co-Pay: 20% Providers:  SECONDARY:       Policy#:      Phone#:    Development worker, community:       Phone#:    The Therapist, art Information Summary" for patients in Inpatient Rehabilitation Facilities with attached "Privacy Act [place] Records" was provided and verbally reviewed with: Patient and Family   Emergency Contact Information         Contact Information     Name Relation Home Work Mobile    [place] Sister     [phone]         Current Medical History  Patient Admitting Diagnosis: debility    History of Present Illness: Fred [name] is a 50 year old right-handed male with history of hypertension, diabetes mellitus, obesity with BMI 38.37, end-stage renal disease with hemodialysis Tuesday Thursday Saturday at [facility], and reported medical noncompliance with hypertensive medications.  Patient with a long complicated history.  Prior to patient's initial hospital admission he lived with his 74 year old father in [place], independent prior to admission. Has had complicated medical course since 01/2020 with multiple hospitalizations with d/c to SNF  rehab.  Presented 04/29/2020 from [facility] skilled nursing facility for sepsis related to multiple wounds to bilateral heels/feet and sacrum, and recent COVID-19 infection (+ 04/20/2020). On admission patient with increasing shortness of breath as well as low-grade fever 100.9.  Sacral ulcer did have a foul-smelling odor.  Admission chemistries lactic acid 0.9, blood cultures no growth to date, BUN 11, creatinine 4.41, WBC 5800, hemoglobin 7.8, pro calcitonin 1.24.  Chest x-ray showed diffuse bilateral airspace disease correlated with COVID-19 status.  MRI of the left foot area of ulceration with nailbed irregularity at the first digit with findings suggestive of osteomyelitis involving the first digit distal tuft.  No loculated fluid collection or sinus tract and right foot MRI abnormal osseous edema head of the fifth metatarsal suspicious for osteomyelitis.  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  Surgery was consulted in regards to sacral and heel wounds and was receiving hydrotherapy, now completed.  Placed on vancomycin and cefepime to be ongoing through 06/10/2020.  Dr. Sharol Given was consulted in regards to unstageable pressure wounds of  left lateral foot, lateral malleolus right, lateral foot and heel right, and dry gangrene of left fourth toe, as well as left first metatarsal and right fifth metatarsal.  Duda recommended Prevalon boots and would follow as outpatient.  Hemodialysis ongoing as per renal services, monitoring closely for bouts of hypotension.  Subcutaneous heparin for DVT prophylaxis.  Due to patient's profound deconditioning and multiple medical issues he was recommended for a comprehensive rehabilitation program.   Patient's medical record from [facility] has been reviewed by the rehabilitation admission coordinator and physician.   Past Medical History      Past Medical History:   Diagnosis Date  . Chronic kidney disease      HD MWF  . Diabetes mellitus without complication ([place])    . Hypertension        Family History   family history is not on file.   Prior Rehab/Hospitalizations Has the patient had prior rehab or hospitalizations prior to admission? Yes   Has the patient had major surgery during 100 days prior to admission? No               Current Medications   Current Facility-Administered Medications:  .  (feeding supplement) PROSource Plus liquid 30 mL, 30 mL, Oral, TID BM, Hoffman, Erik C, DO, 30 mL at 06/05/20 1401 .  acetaminophen (TYLENOL) tablet 650 mg, 650 mg, Oral, Q6H PRN, Marianna Payment, MD, 650 mg at 05/28/20 1226 .  ceFEPIme (MAXIPIME) 2 g in sodium chloride 0.9 % 100 mL IVPB, 2 g, Intravenous, Q T,Th,Sat-1800, Pham, Minh Q, RPH-CPP, Last Rate: 200 mL/hr at 06/05/20 1844, 2 g at 06/05/20 1844 .  ceFEPIme (MAXIPIME) 2 g in sodium chloride 0.9 % 100 mL IVPB, 2 g, Intravenous, Once, Hoffman, Erik C, DO .  Chlorhexidine Gluconate Cloth 2 % PADS 6 each, 6 each, Topical, Q0600, Penninger, Ria Comment, [place] .  Darbepoetin Alfa (ARANESP) injection 200 mcg, 200 mcg, Subcutaneous, Q Sat-1800, Schertz, Robert, MD .  famotidine (PEPCID) tablet 20 mg, 20 mg, Oral, QHS, Angelica Pou, MD, 20 mg at 06/05/20 2144 .  feeding supplement (NEPRO CARB STEADY) liquid 237 mL, 237 mL, Oral, BID BM, Zeyfang, David, PA-C, 237 mL at 06/05/20 1401 .  heparin injection 7,500 Units, 7,500 Units, Subcutaneous, Q8H, Pham, Minh Q, RPH-CPP, 7,500 Units at 06/06/20 0606 .  insulin aspart (novoLOG) injection 0-5 Units, 0-5 Units, Subcutaneous, QHS, Iona Beard, MD, 2 Units at 05/26/20 2137 .  insulin aspart (novoLOG) injection 0-9 Units, 0-9 Units, Subcutaneous, TID WC, Iona Beard, MD, 2 Units at 06/05/20 1159 .  insulin glargine (LANTUS) injection 14 Units, 14 Units, Subcutaneous, QHS, Katsadouros, Vasilios, MD, 14 Units at 06/05/20 2145 .  loperamide (IMODIUM) capsule 2  mg, 2 mg, Oral, Q4H, Katsadouros, Vasilios, MD, 2 mg at 06/06/20 0606 .  metoCLOPramide (REGLAN) tablet 5 mg, 5 mg, Oral, TID Moise Boring, MD, 5 mg at 06/05/20 0901 .  multivitamin (RENA-VIT) tablet 1 tablet, 1 tablet, Oral, QHS, Ernest Haber, PA-C, 1 tablet at 06/05/20 2144 .  ondansetron (ZOFRAN) tablet 4 mg, 4 mg, Oral, Q6H PRN **OR** ondansetron (ZOFRAN) injection 4 mg, 4 mg, Intravenous, Q6H PRN, Marianna Payment, MD .  oxyCODONE-acetaminophen (PERCOCET/ROXICET) 5-325 MG per tablet 1 tablet, 1 tablet, Oral, Q4H PRN, Marianna Payment, MD .  sodium chloride flush (NS) 0.9 % injection 3 mL, 3 mL, Intravenous, Q12H, Marianna Payment, MD, 3 mL at 06/05/20 2149 .  vancomycin (VANCOCIN) IVPB 1000 mg/200 mL premix, 1,000 mg, Intravenous, Q  T,Th,Sa-HD, Devin Going, Minh Q, RPH-CPP, Last Rate: 200 mL/hr at 06/04/20 1119, 1,000 mg at 06/04/20 1119   Patients Current Diet:     Diet Order                      Diet Carb Modified Fluid consistency: Thin; Room service appropriate? Yes; Fluid restriction: 1200 mL Fluid  Diet effective now                      Precautions / Restrictions Precautions Precautions: Fall Precaution Comments: wounds on bilat feet and L sacrum, monitor orthostatic BPs Other Brace: Ace wrap in room to be donned to BLE and abdominal binder prior to EOB, B prevalon boots in room Restrictions Weight Bearing Restrictions: No    Has the patient had 2 or more falls or a fall with injury in the past year? Yes   Prior Activity Level Limited Community (1-2x/wk): in the last 3 months has had extended hospitalization and was in rehab at [facility], where he was admitted from; was beginning gait training when he was admitted to [facility] for sepsis Community (5-7x/wk): typical baseline is independent, no DME, and driving   Prior Functional Level Self Care: Did the patient need help bathing, dressing, using the toilet or eating? Prior to September, independent.  Since hospitalization in Sept  requiring assist for all ADLs   Indoor Mobility: Did the patient need assistance with walking from room to room (with or without device)? Prior to September, independent.  Since hospitalization in September, had just begun to do pre-gait training with PT at [facility].    Stairs: Did the patient need assistance with internal or external stairs (with or without device)? Prior to September, independent.  Since hospitalization in September, unable.     Functional Cognition: Did the patient need help planning regular tasks such as shopping or remembering to take medications? Needed some help.    Home Assistive Devices / Equipment Home Assistive Devices/Equipment: Wheelchair   Prior Device Use: Indicate devices/aids used by the patient prior to current illness, exacerbation or injury? No DME at September baseline   Current Functional Level Cognition   Overall Cognitive Status: No family/caregiver present to determine baseline cognitive functioning Current Attention Level: Selective Orientation Level: Oriented X4 Following Commands: Follows one step commands consistently,Follows one step commands with increased time Safety/Judgement: Decreased awareness of safety,Decreased awareness of deficits General Comments: Pt pleasant and motivated to complete all tasks, increased processing times and at times poor insight into functional deficits and what he needs to do to progress strength/endurance despite frequent instruction. Reinforced therex and HOB up/down during day every day.    Extremity Assessment (includes Sensation/Coordination)   Upper Extremity Assessment: Generalized weakness,RUE deficits/detail,LUE deficits/detail RUE Deficits / Details: impaired shoulder flexion RUE Sensation: decreased light touch RUE Coordination: decreased fine motor,decreased gross motor LUE Deficits / Details: impaired shoulder strength/ROM LUE Sensation: decreased light touch LUE Coordination: decreased fine  motor,decreased gross motor  Lower Extremity Assessment: Defer to PT evaluation RLE Deficits / Details: several black spots over R and L feet - ortho following. RLE Sensation: decreased light touch,history of peripheral neuropathy LLE Sensation: decreased light touch,history of peripheral neuropathy     ADLs   Overall ADL's : Needs assistance/impaired Eating/Feeding: Set up,Bed level Eating/Feeding Details (indicate cue type and reason): malfunction with bed settings, could not elevate bed for self feeding and unsafe to leave pt sitting EOB to eat. Pt propped up on elbow for lunch.  Grooming: Set up,Supervision/safety,Sitting,Wash/dry face,Oral care,Wash/dry hands Grooming Details (indicate cue type and reason): EOB Upper Body Bathing: Min guard,Sitting Upper Body Bathing Details (indicate cue type and reason): simulated seated EOB Lower Body Bathing: Moderate assistance,Sitting/lateral leans Lower Body Bathing Details (indicate cue type and reason): simulated seated EOB Upper Body Dressing : Minimal assistance,Sitting,Bed level Lower Body Dressing: Maximal assistance,Bed level Lower Body Dressing Details (indicate cue type and reason): Pt able to form figure four position in bed but could not bring feet close enough to don socks. decreased coordination/control of B LE Toilet Transfer: +2 for physical assistance,+2 for safety/equipment,Moderate assistance Toilet Transfer Details (indicate cue type and reason): sit - stand mod A +2 from EOB 2 person HHA Toileting- Clothing Manipulation and Hygiene: Total assistance Toileting - Clothing Manipulation Details (indicate cue type and reason): posterior perihygiene rolling in bed x 2 occasions due to diarrhea Functional mobility during ADLs: Moderate assistance,+2 for physical assistance General ADL Comments: Continues to be limited by decreased strength, dropping BP with activities     Mobility   Overal bed mobility: Needs Assistance Bed  Mobility: Rolling,Sidelying to Sit,Sit to Sidelying Rolling: Modified independent (Device/Increase time) Sidelying to sit: Supervision Supine to sit: Min assist Sit to supine: Min assist Sit to sidelying: Supervision General bed mobility comments: pt able to perform log roll with supervision and pull up in bed in supine with supervision     Transfers   Overall transfer level: Needs assistance Equipment used: 2 person hand held assist Transfer via Lift Equipment: Stedy Transfers: Sit to/from Stand Sit to Stand: Max assist,+2 physical assistance,From elevated surface  Lateral/Scoot Transfers: Total assist General transfer comment: STS x1 attempt (dizzy)     Ambulation / Gait / Stairs / Wheelchair Mobility   Ambulation/Gait Ambulation/Gait assistance:  (NT) General Gait Details: unable     Posture / Balance Dynamic Sitting Balance Sitting balance - Comments: able to sit EOB with close supervision Balance Overall balance assessment: Needs assistance Sitting-balance support: Feet supported,Single extremity supported Sitting balance-Leahy Scale: Fair Sitting balance - Comments: able to sit EOB with close supervision Postural control: Right lateral lean Standing balance support: Bilateral upper extremity supported,During functional activity Standing balance-Leahy Scale: Poor Standing balance comment: reliant on Stedy rail and +2 min/modA for upright posture, able to stand with U UE support and do LUE activity with modA; stood ~1 minute     Special needs/care consideration Dialysis: Hemodialysis Tuesday, Thursday and Saturday, Special Bed air mattress overlay, Skin multiple wounds, see flowsheet for details, Diabetic management yes and Special service needs hydrotherapy    Previous Home Environment (from acute therapy documentation) Living Arrangements: Group Home Home Care Services: No Additional Comments: [name]   Discharge Living Setting Plans for Discharge Living Setting:  Lives with (comment) (dad) Type of Home at Discharge:  (house) Discharge Home Layout: One level Discharge Home Access: Level entry Discharge Bathroom Shower/Tub: Tub/shower unit Discharge Bathroom Toilet: Standard Discharge Bathroom Accessibility: No Does the patient have any problems obtaining your medications?: Yes (Describe)   Social/Family/Support Systems Anticipated Caregiver: sister, [name] is main contact Anticipated Caregiver's Contact Information: [phone] Ability/Limitations of Caregiver: supervision Caregiver Availability: 24/7 Discharge Plan Discussed with Primary Caregiver: Yes Is Caregiver In Agreement with Plan?: Yes Does Caregiver/Family have Issues with Lodging/Transportation while Pt is in Rehab?: No   Goals Patient/Family Goal for Rehab: PT/OT supervision min guard w/c level, SLP n/a Expected length of stay: 24-28 days Pt/Family Agrees to Admission and willing to participate: Yes Program Orientation Provided & Reviewed with  Pt/Caregiver Including Roles  & Responsibilities: Yes  Barriers to Discharge: Decreased caregiver support,Inaccessible home environment   Decrease burden of Care through IP rehab admission: Otherhydrotherapy   Possible need for SNF placement upon discharge: potentially    Patient Condition: I have reviewed medical records from [facility], spoken with CM, and patient and family member. I met with patient at the bedside for inpatient rehabilitation assessment.  Patient will benefit from ongoing PT and OT, can actively participate in 3 hours of therapy a day 5 days of the week, and can make measurable gains during the admission.  Patient will also benefit from the coordinated team approach during an Inpatient Acute Rehabilitation admission.  The patient will receive intensive therapy as well as Rehabilitation physician, nursing, social worker, and care management interventions.  Due to bladder management, safety, skin/wound care, disease  management, medication administration, pain management and patient education the patient requires 24 hour a day rehabilitation nursing.  The patient is currently mod+2 with mobility and basic ADLs, and limited by orthostatic hypotension.  Discharge setting and therapy post discharge at a lower level of care is anticipated.  Patient has agreed to participate in the Acute Inpatient Rehabilitation Program and will admit 06/06/2020.   Preadmission Screen Completed By: Shann Medal, PT, DPT with updates completed by  Genella Mech, 06/06/2020 8:09 AM with updates by Clemens Catholic ______________________________________________________________________   Discussed status with Dr. Ranell Patrick  on 06/06/20 at 8:09 AM  and received approval for admission 06/06/2020.   Admission Coordinator: Shann Medal, PT, DPT and  Genella Mech, CCC-SLP, time 8:09 AM Sudie Grumbling 06/06/2020   Assessment/Plan: Diagnosis: Sacral osteomyelitis 1. Does the need for close, 24 hr/day Medical supervision in concert with the patient's rehab needs make it unreasonable for this patient to be served in a less intensive setting? Yes 2. Co-Morbidities requiring supervision/potential complications: ESRD, HTN, insulin dependent type 2 DM, COVID-19 pneumonia, gangrene and osteomyelitis of L foot digits 3. Due to bladder management, bowel management, safety, skin/wound care, disease management, medication administration, pain management and patient education, does the patient require 24 hr/day rehab nursing? Yes 4. Does the patient require coordinated care of a physician, rehab nurse, PT, OT to address physical and functional deficits in the context of the above medical diagnosis(es)? Yes Addressing deficits in the following areas: balance, endurance, locomotion, strength, transferring, bowel/bladder control, bathing, dressing, feeding, grooming, toileting and psychosocial support 5. Can the patient actively participate in an intensive therapy program  of at least 3 hrs of therapy 5 days a week? Yes 6. The potential for patient to make measurable gains while on inpatient rehab is excellent 7. Anticipated functional outcomes upon discharge from inpatient rehab: min assist PT, min assist OT, independent SLP 8. Estimated rehab length of stay to reach the above functional goals is: 2-3 weeks 9. Anticipated discharge destination: Home 10. Overall Rehab/Functional Prognosis: excellent     MD Signature: Leeroy Cha, MD          Revision History                                  Note Details  Author Izora Ribas, MD File Time 06/06/2020  8:16 AM  Author Type Physician Status Signed  Last Editor Izora Ribas, MD Service Physical Medicine and [place] # [id number] Admit Date 06/06/2020

## 2020-06-06 NOTE — Plan of Care (Signed)
  Problem: Health Behavior/Discharge Planning: Goal: Ability to manage health-related needs will improve Outcome: Progressing   Problem: Activity: Goal: Risk for activity intolerance will decrease Outcome: Progressing   

## 2020-06-07 ENCOUNTER — Inpatient Hospital Stay (HOSPITAL_COMMUNITY): Payer: Medicare Other | Admitting: Occupational Therapy

## 2020-06-07 ENCOUNTER — Inpatient Hospital Stay (HOSPITAL_COMMUNITY): Payer: Medicare Other

## 2020-06-07 LAB — GLUCOSE, CAPILLARY
Glucose-Capillary: 102 mg/dL — ABNORMAL HIGH (ref 70–99)
Glucose-Capillary: 131 mg/dL — ABNORMAL HIGH (ref 70–99)
Glucose-Capillary: 146 mg/dL — ABNORMAL HIGH (ref 70–99)
Glucose-Capillary: 90 mg/dL (ref 70–99)

## 2020-06-07 MED ORDER — DARBEPOETIN ALFA 200 MCG/0.4ML IJ SOSY
200.0000 ug | PREFILLED_SYRINGE | INTRAMUSCULAR | Status: DC
  Administered 2020-06-22 – 2020-06-29 (×2): 200 ug via INTRAVENOUS
  Filled 2020-06-07 (×5): qty 0.4

## 2020-06-07 MED ORDER — DARBEPOETIN ALFA 200 MCG/0.4ML IJ SOSY: 200.0000 ug | PREFILLED_SYRINGE | INTRAMUSCULAR | Status: DC

## 2020-06-07 MED ORDER — CHLORHEXIDINE GLUCONATE CLOTH 2 % EX PADS
6.0000 | MEDICATED_PAD | Freq: Every day | CUTANEOUS | Status: DC
  Administered 2020-06-07 – 2020-06-13 (×7): 6 via TOPICAL

## 2020-06-07 NOTE — Progress Notes (Addendum)
Subjective: Rehab pending today, denies diarrhea, next HD tomorrow Tuesday on schedule  Objective Vital signs in last 24 hours: Vitals:   06/06/20 1256 06/06/20 1436 06/06/20 1911 06/07/20 0411  BP: (!) 182/66  (!) 164/94 (!) 149/97  Pulse: 96  96 89  Resp: '16  20 17  '$ Temp: 98.4 F (36.9 C)  98.5 F (36.9 C) 98.9 F (37.2 C)  TempSrc: Oral  Oral Oral  SpO2:   99% 99%  Weight: (!) 158.8 kg (!) 158.8 kg  (!) 160.1 kg  Height: '6\' 6"'$  (1.981 m)      Weight change:   Physical Exam General:Alert , obese chronically ill appearing male,NAD Heart:RRR, no mrg Lungs:CTAB, nml WOB Abdomen:softBS +,NT,ND Extremities:no LE edema, b/l heels wrapped Dialysis Access:R IJTDC   Dialysis Orders: NWTTS 4h 400/800 143.5kg 3K/2.5Ca TDCHeparin3000units IV TIW - Mircera120mgIVq2wks - last 12/8 - Venofer '100mg'$ IVqHD x10 - completed 6/10 doses  Problem/Plan: 1. Sacral decub/coccygealosteo: On Vanc/Cefepime x 6 weeks (thru 06/10/20) - ID following.Continues hydrotherapy.    Now in CIR 2. 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 3. ESRD: on HD TTS.HD has beenoff schedule sec.Roll over due to high patient census.Tomor HD  on schedule Tues, recent labs okay 4. Diarrhea -?antibio effect/??Renvela effect-resolved since off Renvela 5. BP/volume:BP starting to rise up some systolic and diastolic.  had issues with hypotension which improved once amlodipine and metoprolol d/c'd. Keep SBP >100 on HD. Has had problems with orthostatic hypotension not helping rehab,appears euvolemic on exam.+diarrhea,planrun even with HD let volume come up.  Follow-up trend= restarted  low-dose amlodipine 2.5 mg at bedtime 6. COVID PNA- tested positive on 11/30, is off isolation now.  7. Anemiaof CKD- Hgb8.8,= 1/16cont Aranesp 2023mq Thursday.  Was given Aranesp subcu on Sunday 1/16(evidently he missed Thursday's  rollover schedule) will give next on  Saturday 8. Secondary Hyperparathyroidism: CorrCa9.9, phos5.6 on 1/16 not on VDRA. Stopped renvela 2/2 diarrhea follow-up next phosphorus before starting different binder  ?Fosrenol possibly 9. Nutrition: last Alb1.9 on 1/11, continue Nepro + pro-source supplements.On carb modified diet.  With potassium, phosphorus stable / volume stable currently, follow labs. 10. DM T2 - per admit  DaErnest HaberPA-C [facility]per [id number]/17/2022,12:21 PM  LOS: 1 day   Labs: Basic Metabolic Panel: Recent Labs  Lab 06/01/20 0757 06/02/20 0200 06/03/20 0236 06/05/20 0901 06/06/20 0159 06/06/20 1453  NA 134* 137   < > 136 136 135  K 4.3 4.2   < > 4.4 3.7 4.4  CL 97* 98   < > 99 98 97*  CO2 25 28   < > '25 29 26  '$ GLUCOSE 89 154*   < > 115* 143* 129*  BUN 43* 28*   < > 32* 20 33*  CREATININE 6.47* 4.83*   < > 5.90* 4.05* 4.91*  CALCIUM 9.3 8.5*   < > 8.6* 8.1* 8.6*  PHOS 6.6* 5.4*  --   --   --  5.6*   < > = values in this interval not displayed.   Liver Function Tests: Recent Labs  Lab 06/01/20 0757 06/06/20 1453  ALBUMIN 1.8* 1.9*   No results for input(s): LIPASE, AMYLASE in the last 168 hours. No results for input(s): AMMONIA in the last 168 hours. CBC: Recent Labs  Lab 06/01/20 0757 06/03/20 0236 06/05/20 1839 06/06/20 0159  WBC 9.9 10.4 9.0 8.1  HGB 9.4* 9.5* 9.5* 8.8*  HCT 30.3* 30.4* 31.5* 29.5*  MCV 86.8 86.1 87.5  88.3  PLT 252 269 245 240   Cardiac Enzymes: No results for input(s): CKTOTAL, CKMB, CKMBINDEX, TROPONINI in the last 168 hours. CBG: Recent Labs  Lab 06/06/20 1137 06/06/20 1631 06/06/20 2128 06/07/20 0619 06/07/20 1156  GLUCAP 160* 122* 116* 102* 131*    Studies/Results: No results found. Medications: . [START ON 06/08/2020] ceFEPime (MAXIPIME) IV    . [START ON 06/08/2020] vancomycin     . (feeding supplement) PROSource Plus  30 mL Oral TID BM  . Chlorhexidine Gluconate Cloth  6 each Topical Daily  .  darbepoetin (ARANESP) injection - NON-DIALYSIS  200 mcg Subcutaneous Q Sun-1800  . famotidine  20 mg Oral QHS  . feeding supplement (NEPRO CARB STEADY)  237 mL Oral BID BM  . heparin injection (subcutaneous)  7,500 Units Subcutaneous Q8H  . insulin aspart  0-9 Units Subcutaneous TID WC  . insulin glargine  14 Units Subcutaneous QHS  . loperamide  2 mg Oral Q4H  . metoCLOPramide  5 mg Oral TID AC  . multivitamin  1 tablet Oral QHS

## 2020-06-07 NOTE — Progress Notes (Addendum)
Initial Nutrition Assessment  DOCUMENTATION CODES:   Morbid obesity  INTERVENTION:   -Renal MVI daily -30 ml Prosource Plus TID, each supplement provides 100 kcals and 15 grams protein  -Nepro Shake po BID, each supplement provides 425 kcal and 19 grams protein -Double protein portions at each meal  NUTRITION DIAGNOSIS:   Increased nutrient needs related to wound healing as evidenced by estimated needs.  GOAL:   Patient will meet greater than or equal to 90% of their needs  MONITOR:   PO intake,Supplement acceptance,Labs,Weight trends,Skin,I & O's  REASON FOR ASSESSMENT:   Malnutrition Screening Tool    ASSESSMENT:   Fred [name] is a 50 year old right-handed male with history of hypertension, diabetes mellitus, obesity with BMI 38.37, end-stage renal disease with hemodialysis Tuesday Thursday Saturday at [facility] and reported medical noncompliance with hypertensive medications.  Pt admitted with critical illness/mypoathy/ debilitation secondary to COVID-19.   Pt unavailable at time of attempted contact.   Pt familiar to this RD from prior admission as an inpatient.   Pt with variable appetite. Per pt, intake often varies with diarrhea, which has been improving. Noted meal completion 0-75%.   Reviewed wt; wt has been stable. Per nephrology notes, EDW 143.5 kg.   Pt with increased nutrient needs related to wound healing. Pt has been compliant with Prosource and Nepro supplements in the past and will continue these to help meet nutritional goals. Pt has shared that he does not particularly care for the taste of these supplements, however, continues to agree to take them as he knows that they are important to help with wound healing.   Medications reviewed and include aranesp and reglan.  Labs reviewed: CBGS: 102-122 (inpatient orders for glycemic control are 0-9 units insulin aspart TID with meals and 14 units insulin glargine daily at bedtime).  Diet Order:   Diet  Order            Diet Carb Modified Fluid consistency: Thin; Room service appropriate? Yes; Fluid restriction: 1200 mL Fluid  Diet effective now                 EDUCATION NEEDS:   No education needs have been identified at this time  Skin:  Skin Assessment: Skin Integrity Issues: Skin Integrity Issues:: Stage IV,Unstageable Stage IV: lt buttock Unstageable: lt lateral ankle, lt posterior foot, rt medial foot, rt heel  Last BM:  06/05/20  Height:   Ht Readings from Last 1 Encounters:  06/06/20 6\' 6"  (1.981 m)    Weight:   Wt Readings from Last 1 Encounters:  06/07/20 (!) 160.1 kg    Ideal Body Weight:  97.3 kg  BMI:  Body mass index is 40.79 kg/m.  Estimated Nutritional Needs:   Kcal:  0712-1975  Protein:  165-195 grams  Fluid:  1.2 L    Loistine Chance, RD, LDN, [place] Registered Dietitian II Certified Diabetes Care and Education Specialist Please refer to [facility] for RD and/or RD on-call/weekend/after hours pager

## 2020-06-07 NOTE — Progress Notes (Signed)
[place] PHYSICAL MEDICINE & REHABILITATION PROGRESS NOTE   Subjective/Complaints:  Pt reports pain controlled- had diarrhea that was bad for 2 weeks, so isn't concerned hasn't had a BM in 2 days-  Urinates 1x/week usually due to ESRD on HD.   Also, per therapy, pt was up with therapy- started feeling light headed, and vomited- BP was 146/100 when was laying down.  Wasn't able to [facility] orthostatic hypotension, however per OT, Sx's were shown clinically.      ROS:  Pt denies SOB, abd pain, CP, N/V/C/D, and vision changes   Objective:   No results found. Recent Labs    06/05/20 1839 06/06/20 0159  WBC 9.0 8.1  HGB 9.5* 8.8*  HCT 31.5* 29.5*  PLT 245 240   Recent Labs    06/06/20 0159 06/06/20 1453  NA 136 135  K 3.7 4.4  CL 98 97*  CO2 29 26  GLUCOSE 143* 129*  BUN 20 33*  CREATININE 4.05* 4.91*  CALCIUM 8.1* 8.6*    Intake/Output Summary (Last 24 hours) at 06/07/2020 1050 Last data filed at 06/06/2020 2124 Gross per 24 hour  Intake 360 ml  Output -  Net 360 ml     Pressure Injury 05/01/20 Buttocks Left Stage 4 - Full thickness tissue loss with exposed bone, tendon or muscle. packed with santyl gauze, dressed by WOCN, (Active)  05/01/20 0230  Location: Buttocks  Location Orientation: Left  Staging: Stage 4 - Full thickness tissue loss with exposed bone, tendon or muscle.  Wound Description (Comments): packed with santyl gauze, dressed by WOCN,  Present on Admission: Yes     Pressure Injury 05/01/20 Ankle Left;Lateral Unstageable - Full thickness tissue loss in which the base of the injury is covered by slough (yellow, tan, gray, green or brown) and/or eschar (tan, brown or black) in the wound bed. smal circular wound on the (Active)  05/01/20 0230  Location: Ankle  Location Orientation: Left;Lateral  Staging: Unstageable - Full thickness tissue loss in which the base of the injury is covered by slough (yellow, tan, gray, green or brown) and/or eschar  (tan, brown or black) in the wound bed.  Wound Description (Comments): smal circular wound on the bone  Present on Admission: Yes     Pressure Injury 05/01/20 Foot Left;Posterior Unstageable - Full thickness tissue loss in which the base of the injury is covered by slough (yellow, tan, gray, green or brown) and/or eschar (tan, brown or black) in the wound bed. 4th and 5th toes appear n (Active)  05/01/20 0230  Location: Foot (4th and 5th toes and lateral/anterior side of the sole)  Location Orientation: Left;Posterior  Staging: Unstageable - Full thickness tissue loss in which the base of the injury is covered by slough (yellow, tan, gray, green or brown) and/or eschar (tan, brown or black) in the wound bed.  Wound Description (Comments): 4th and 5th toes appear necrotic circular wound on sole  Present on Admission: Yes     Pressure Injury 05/01/20 Foot Right;Medial;Posterior Unstageable - Full thickness tissue loss in which the base of the injury is covered by slough (yellow, tan, gray, green or brown) and/or eschar (tan, brown or black) in the wound bed. large circular wo (Active)  05/01/20 0230  Location: Foot  Location Orientation: Right;Medial;Posterior  Staging: Unstageable - Full thickness tissue loss in which the base of the injury is covered by slough (yellow, tan, gray, green or brown) and/or eschar (tan, brown or black) in the wound bed.  Wound Description (Comments): large circular wound on sole  and heel  Present on Admission: Yes     Pressure Injury 05/08/20 Heel Right Unstageable - Full thickness tissue loss in which the base of the injury is covered by slough (yellow, tan, gray, green or brown) and/or eschar (tan, brown or black) in the wound bed. (Active)  05/08/20 1718  Location: Heel  Location Orientation: Right  Staging: Unstageable - Full thickness tissue loss in which the base of the injury is covered by slough (yellow, tan, gray, green or brown) and/or eschar (tan,  brown or black) in the wound bed.  Wound Description (Comments):   Present on Admission: Yes    Physical Exam: Vital Signs Blood pressure (!) 149/97, pulse 89, temperature 98.9 F (37.2 C), temperature source Oral, resp. rate 17, height '6\' 6"'$  (1.981 m), weight (!) 160.1 kg, SpO2 99 %.   Physical Exam Gen: laying in bed- appears to not know why he's here in CIR, but amiable, NAD HEENT: oral mucosa pink and moist, NCAT Cardio: RRR Chest: CTA B/L- no W/R/R- good air movement Abd: soft, NT, ND, hyperactive bs Ext: no edema Psych: amiable, but slightly confused? Skin:    Comments: Sacral decubitus as well as bilateral heel dressings are in place change routinely per wound care nurse  Neurological: Ox2    Comments: Patient is alert in no acute distress.  Complaints of feeling weak. 5/5 strength throughout.   Follows simple commands.      Assessment/Plan: 1. Functional deficits which require 3+ hours per day of interdisciplinary therapy in a comprehensive inpatient rehab setting.  Physiatrist is providing close team supervision and 24 hour management of active medical problems listed below.  Physiatrist and rehab team continue to assess barriers to discharge/monitor patient progress toward functional and medical goals  Care Tool:  Bathing              Bathing assist       Upper Body Dressing/Undressing Upper body dressing        Upper body assist      Lower Body Dressing/Undressing Lower body dressing            Lower body assist       Toileting Toileting    Toileting assist       Transfers Chair/bed transfer  Transfers assist           Locomotion Ambulation   Ambulation assist              Walk 10 feet activity   Assist           Walk 50 feet activity   Assist           Walk 150 feet activity   Assist           Walk 10 feet on uneven surface  activity   Assist           Wheelchair     Assist                Wheelchair 50 feet with 2 turns activity    Assist            Wheelchair 150 feet activity     Assist          Blood pressure (!) 149/97, pulse 89, temperature 98.9 F (37.2 C), temperature source Oral, resp. rate 17, height '6\' 6"'$  (1.981 m), weight (!) 160.1 kg, SpO2 99 %.  Medical Problem List and Plan: 1.  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              -ELOS/Goals: MinA 2-3 weeks. 2.  Antithrombotics: -DVT/anticoagulation: Subcutaneous heparin.  Check vascular study             -antiplatelet therapy: N/A 3. Pain Management: Tylenol as needed 4. Mood: Provide emotional support             -antipsychotic agents: N/A 5. Neuropsych: This patient is? capable of making decisions on his own behalf.  1/17- pt appeared slightly confused but amiable this AM- will monitor 6. Skin/Wound Care: Routine skin checks 7. Fluids/Electrolytes/Nutrition: Routine in and outs with follow-up chemistries 8.  End-stage renal disease.  Hemodialysis per renal services 9.  Sacral decubitus ulcer as well as ischemic ulcers on the left foot fourth toe and fifth metatarsal head and right heel and fifth metatarsal head.  Wound care nurse follow-up.  Patient to see Dr. Sharol Given outpatient.  Continue bilateral PRAFO's. 10.  ID/ coccygeal osteomyelitis.  Continue vancomycin and cefepime through 06/10/2020  1/17- WBC 8.1 10.  Anemia of chronic disease.  Continue Aranesp. Hgb 8.8 on 1/16, repeat with HD  1/17- no CBC today, only BMP- will try and check with HD on Tuesday.  11.  Diabetes mellitus.  Hemoglobin A1c 5.6.  Lantus insulin 14 units nightly             1/16: CBGs 108-167: continue current regimen.   1/17- BGs 102-160- con't regimen 12.  Morbid obesity.  BMI 38.37.  Dietary follow-up  13. Orthostatic hypotension  1/17- Sx's so significant, vomited this AM when  got up- will try abd binder and ACE wraps for LEs- since due to osteomyelitis, don't think can use TEDs- if need be, can add Midodrine, but due to ESRD, cannot start Florinef.   LOS: 1 days A FACE TO FACE EVALUATION WAS PERFORMED  Offie Pickron 06/07/2020, 10:50 AM

## 2020-06-07 NOTE — Evaluation (Signed)
Occupational Therapy Assessment and Plan  [name] MRN: [id number] Date of Birth: [birth year]  OT Diagnosis: abnormal posture and muscle weakness (generalized) Rehab Potential:   ELOS: 3 weeks   Today's Date: 06/07/2020 OT Individual Time: 3300-7622 OT Individual Time Calculation (min): 72 min     Hospital Problem: Principal Problem:   Critical illness myopathy   Past Medical History:  Past Medical History:  Diagnosis Date  . Chronic kidney disease    HD MWF  . Diabetes mellitus without complication ([place])   . Hypertension    Past Surgical History:  Past Surgical History:  Procedure Laterality Date  . CHOLECYSTECTOMY      Assessment & Plan Clinical Impression: Patient is a 50 y.o. year old male with history of hypertension, diabetes mellitus, obesity with BMI 38.37, end-stage renal disease with hemodialysis Tuesday Thursday Saturday at [facility] and reported medical noncompliance with hypertensive medications.  Patient with a long complicated history.  Prior to patient's initial hospital admission he lived with his 77 year old father in [address] independent prior to admission.  1 level home with ramped entrance.  Presented 04/29/2020 from [facility] skilled nursing facility where he had been for an extended time for events from COVID-19 testing + 04/20/2020 and issues related to his gallbladder and essentially was bedbound developing a sacral decubitus as well as bilateral heel wounds.  On admission patient with increasing shortness of breath as well as low-grade fever 100.9.  Sacral ulcer did have a foul-smelling odor.  Admission chemistries lactic acid 0.9, blood cultures no growth to date, BUN 11, creatinine 4.41, WBC 5800, hemoglobin 7.8, pro calcitonin 1.24.  Chest x-ray showed diffuse bilateral airspace disease correlated with COVID-19 status.  MRI of the left foot area of ulceration with nailbed irregularity at the first digit with findings suggestive  of osteomyelitis involving the first digit distal tuft.  No loculated fluid collection or sinus tract and right foot MRI abnormal osseous edema head of the fifth metatarsal suspicious for osteomyelitis.  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  Surgery was consulted in regards to sacral and heel wounds and was receiving hydrotherapy.  Placed on vancomycin and cefepime to be ongoing through 06/10/2020.  Dr. Sharol Given was consulted in regards to unstageable pressure wounds of left lateral foot lateral malleolus right lateral foot and heel dry gangrene of left fourth toe as well as left first metatarsal and right fifth meta tarsal advised Prevalon boots and would follow as outpatient.  Hemodialysis ongoing as per renal services monitoring closely for bouts of hypotension.  Subcutaneous heparin for DVT prophylaxis.  Due to patient's profound deconditioning multimedical issues he was admitted for a comprehensive rehabilitation program. Currently denies pain. AOx3.  Patient transferred to CIR on 06/06/2020 .    Patient currently requires mod with basic self-care skills secondary to muscle weakness, decreased cardiorespiratoy endurance and decreased sitting balance, decreased standing balance, decreased postural control and decreased balance strategies.  Prior to hospitalization, patient could complete ADL with modified independent .  Patient will benefit from skilled intervention to decrease level of assist with basic self-care skills, increase independence with basic self-care skills and increase level of independence with iADL prior to discharge home with care partner.  Anticipate patient will require intermittent supervision and minimal physical assistance and follow up home health.  OT - End of Session Activity Tolerance: Tolerates 10 - 20 min activity with multiple rests Endurance  Deficit: Yes Endurance Deficit  Description: pt reported fatigue throughout session - tolerated OOB for 20 minutes OT Assessment OT Barriers to Discharge: Wound Care OT Patient demonstrates impairments in the following area(s): Balance;Sensory;Skin Integrity;Endurance;Motor;Pain OT Basic ADL's Functional Problem(s): Grooming;Bathing;Dressing;Toileting OT Advanced ADL's Functional Problem(s): Simple Meal Preparation;Light Housekeeping OT Transfers Functional Problem(s): Toilet OT Plan OT Intensity: Minimum of 1-2 x/day, 45 to 90 minutes OT Frequency: 5 out of 7 days OT Duration/Estimated Length of Stay: 3 weeks OT Treatment/Interventions: Balance/vestibular training;Self Care/advanced ADL retraining;Therapeutic Exercise;Wheelchair propulsion/positioning;DME/adaptive equipment instruction;Pain management;Skin care/wound managment;UE/LE Strength taining/ROM;Community reintegration;Patient/family education;UE/LE Coordination activities;Discharge planning;Functional mobility training;Therapeutic Activities OT Self Feeding Anticipated Outcome(s): independent OT Basic Self-Care Anticipated Outcome(s): mod I OT Toileting Anticipated Outcome(s): supervision OT Bathroom Transfers Anticipated Outcome(s): supervision OT Recommendation Patient destination: Home Follow Up Recommendations: Home health OT Equipment Recommended: To be determined   OT Evaluation Precautions/Restrictions  Precautions Precautions: Fall Precaution Comments: wounds on bilat feet and L sacrum, monitor orthostatic BPs Required Braces or Orthoses: Other Brace Other Brace: abdominal binder Restrictions Weight Bearing Restrictions: No General   Vital Signs Therapy Vitals Temp: 98.5 F (36.9 C) Temp Source: Oral Pulse Rate: 92 Resp: 18 BP: (!) 179/90 Patient Position (if appropriate): Lying Oxygen Therapy SpO2: 99 % O2 Device: Room Air Pain Pain Assessment Pain Scale: 0-10 Pain Score: 2  Pain Location: Buttocks Pain Orientation: Right Pain  Onset: On-going Patients Stated Pain Goal: 1 Pain Intervention(s): Repositioned Home Living/Prior Functioning Home Living Available Help at Discharge: Family Type of Home: House Home Access: Ramped entrance Home Layout: One level Bathroom Shower/Tub: Multimedia programmer: Handicapped height Bathroom Accessibility: Yes Additional Comments: information taken with assist of sister via phone  Lives With: Family Prior Function Level of Independence: Needs assistance with ADLs,Needs assistance with tranfers,Needs assistance with homemaking,Needs assistance with gait  Able to Take Stairs?: Yes Driving: Yes Vocation: On disability Comments: Pt was at SNF prior to admission but his goal is to return home with his dad Vision Baseline Vision/History: No visual deficits Patient Visual Report: No change from baseline Vision Assessment?: No apparent visual deficits Additional Comments: patient notes some acuity changes in past months - no acute changes Perception  Perception: Within Functional Limits Praxis Praxis: Intact Cognition Overall Cognitive Status: Within Functional Limits for tasks assessed Arousal/Alertness: Awake/alert Orientation Level: Person;Situation;Place Person: Oriented Place: Oriented Situation: Oriented Year: 2022 Month: January Day of Week: Correct Memory: Impaired Immediate Memory Recall: Sock;Blue;Bed Memory Recall Sock: Without Cue Memory Recall Blue: Without Cue Memory Recall Bed: Without Cue Awareness: Appears intact Problem Solving: Appears intact Safety/Judgment: Appears intact Sensation Sensation Additional Comments: pt's reports history of neuropathy. Absent sensation along bilateral LE's from slightly above ankle and below.   arms/hands grossly intact Coordination Fine Motor Movements are Fluid and Coordinated: Yes Coordination and Movement Description: grossly uncoordinated due to generalized weakness, fatigue, lightheadedness, and  decreased balance/postural control Finger Nose Finger Test: [facility] but slow Motor  Motor Motor: Abnormal postural alignment and control Motor - Skilled Clinical Observations: grossly uncoordinated due to generalized weakness, fatigue, lightheadedness, and decreased balance/postural control  Trunk/Postural Assessment  Cervical Assessment Cervical Assessment: Within Functional Limits Thoracic Assessment Thoracic Assessment: Within Functional Limits Lumbar Assessment Lumbar Assessment: Exceptions to [facility] (posterior pelvic tilt in sitting) Postural Control Postural Control: Deficits on evaluation  Balance Balance Balance Assessed: Yes Static Sitting Balance Static Sitting - Balance Support: Feet supported;Bilateral upper extremity supported Static Sitting - Level of Assistance: 5: Stand by assistance Dynamic Sitting Balance Dynamic Sitting - Balance Support:  Bilateral upper extremity supported;Feet supported Dynamic Sitting - Level of Assistance: 5: Stand by assistance Static Standing Balance Static Standing - Balance Support: Bilateral upper extremity supported (parallel bars) Static Standing - Level of Assistance: 3: Mod assist Extremity/Trunk Assessment RUE Assessment Active Range of Motion (AROM) Comments: WFL General Strength Comments: proximal shoulder and elbow extensor weakness 4-/5, distal 4+/5 LUE Assessment Active Range of Motion (AROM) Comments: WFL General Strength Comments: proximal shoulder and elbow extensor weakness 4-/5, distal 4+/5  Care Tool Care Tool Self Care Eating   Eating Assist Level: Supervision/Verbal cueing    Oral Care    Oral Care Assist Level: Supervision/Verbal cueing    Bathing   Body parts bathed by patient: Right arm;Left arm;Chest;Abdomen;Front perineal area;Buttocks;Right upper leg;Left upper leg;Face Body parts bathed by helper: Right lower leg;Left lower leg;Buttocks   Assist Level: Moderate Assistance - Patient 50 - 74%    Upper Body  Dressing(including orthotics)   What is the patient wearing?: Pull over shirt   Assist Level: Supervision/Verbal cueing    Lower Body Dressing (excluding footwear)   What is the patient wearing?: Pants;Incontinence brief Assist for lower body dressing: Maximal Assistance - Patient 25 - 49%    Putting on/Taking off footwear   What is the patient wearing?: Non-skid slipper socks Assist for footwear: Dependent - Patient 0%       Care Tool Toileting Toileting activity   Assist for toileting: Maximal Assistance - Patient 25 - 49%     Care Tool Bed Mobility Roll left and right activity   Roll left and right assist level: Supervision/Verbal cueing    Sit to lying activity   Sit to lying assist level: Supervision/Verbal cueing    Lying to sitting edge of bed activity   Lying to sitting edge of bed assist level: Moderate Assistance - Patient 50 - 74%     Care Tool Transfers Sit to stand transfer   Sit to stand assist level: Maximal Assistance - Patient 25 - 49% (parallel bars)    Chair/bed transfer   Chair/bed transfer assist level: Moderate Assistance - Patient 50 - 74% (slideboard)     Toilet transfer         Care Tool Cognition Expression of Ideas and Wants Expression of Ideas and Wants: Without difficulty (complex and basic) - expresses complex messages without difficulty and with speech that is clear and easy to understand   Understanding Verbal and Non-Verbal Content Understanding Verbal and Non-Verbal Content: Understands (complex and basic) - clear comprehension without cues or repetitions   Memory/Recall Ability *first 3 days only      Refer to Care Plan for Long Term Goals  SHORT TERM GOAL WEEK 1 OT Short Term Goal 1 (Week 1): patient will completed bed mobility with CS OT Short Term Goal 2 (Week 1): patient will complete sit pivot transfers with min A OT Short Term Goal 3 (Week 1): patient will complete lower body bathing and dressing with min A and use of  assistive devices as needed OT Short Term Goal 4 (Week 1): patient will tolerate sitting out of bed for 3-4 hours  Recommendations for other services: None    Skilled Therapeutic Intervention  Patient in bed, alert and ready for therapy session.  Reviewed role of OT and plan of care.  Evaluation completed as documented above.  Patient presents with wounds, limited trunk control, generalized weakness limiting all aspects of self care and functional mobility.  Complicated/prolonged medical history & current wounds may be  a barrier.  He is motivated to improve and is an excellent candidate for IP rehab to maximize strength and mobility to facilitate a safe return to home with family.  Provided bariatric drop arm commode, transfer board and w/c with ROHO cushion.   Patient eager to participate in adl tasks this session - he completed lower body bathing and dressing bed level as documented below.  He requires mod A to move from supine to sitting, min/mod A for SB transfer bed to w/c.  He completed grooming and upper body self care tasks w/c level.  Due to feeling dizzy/light headed and nausea/vomiting returned to bed - BP upon return to bed was 146/100.  He notes that he felt better upon lying down (transfer back to bed with mod A, sit to supine mod A).    He remained in bed at close of session, nursing aware and note sent to MD/PT.  Call bell and tray table in reach.       ADL ADL Eating: Set up;Supervision/safety Where Assessed-Eating: Bed level Grooming: Supervision/safety;Setup Where Assessed-Grooming: Sitting at sink;Wheelchair Upper Body Bathing: Minimal assistance Where Assessed-Upper Body Bathing: Wheelchair;Sitting at sink Lower Body Bathing: Moderate assistance Where Assessed-Lower Body Bathing: Bed level Upper Body Dressing: Setup;Supervision/safety Where Assessed-Upper Body Dressing: Sitting at sink;Wheelchair Lower Body Dressing: Maximal assistance Where Assessed-Lower Body Dressing:  Bed level Mobility  Bed Mobility Bed Mobility: Rolling Right;Rolling Left;Sit to Supine;Supine to Sit Rolling Right: Supervision/verbal cueing Rolling Left: Supervision/Verbal cueing Supine to Sit: Moderate Assistance - Patient 50-74% Sit to Supine: Moderate Assistance - Patient 50-74% Transfers Sit to Stand: Maximal Assistance - Patient 25-49% (parallel bars)   Discharge Criteria: Patient will be discharged from OT if patient refuses treatment 3 consecutive times without medical reason, if treatment goals not met, if there is a change in medical status, if patient makes no progress towards goals or if patient is discharged from hospital.  The above assessment, treatment plan, treatment alternatives and goals were discussed and mutually agreed upon: by patient  [name] 06/07/2020, 3:50 PM

## 2020-06-07 NOTE — Progress Notes (Signed)
Patient information reviewed and entered into [facility] by Becky Kei Mcelhiney, PPS coordinator. Information including medical coding, function ability, and quality indicators will be reviewed and updated through discharge.   

## 2020-06-07 NOTE — Evaluation (Signed)
Physical Therapy Assessment and Plan  [name] MRN: [id number] Date of Birth: [birth year]  PT Diagnosis: Abnormal posture, Cognitive deficits, Difficulty walking, Dizziness and giddiness, Edema, Impaired cognition, Impaired sensation and Muscle weakness Rehab Potential: Good ELOS: 3 weeks   Today's Date: 06/07/2020 PT Individual Time: 3559-7416 PT Individual Time Calculation (min): 83 min    Hospital Problem: Principal Problem:   Critical illness myopathy   Past Medical History:  Past Medical History:  Diagnosis Date  . Chronic kidney disease    HD MWF  . Diabetes mellitus without complication ([place])   . Hypertension    Past Surgical History:  Past Surgical History:  Procedure Laterality Date  . CHOLECYSTECTOMY      Assessment & Plan Clinical Impression: Patient is a 50 y.o. year old male with history of hypertension, diabetes mellitus, obesity with BMI 38.37, end-stage renal disease with hemodialysis Tuesday Thursday Saturday at [facility] and reported medical noncompliance with hypertensive medications.  Patient with a long complicated history.  Prior to patient's initial hospital admission he lived with his 50 year old father in [place] independent prior to admission.  1 level home with ramped entrance.  Presented 04/29/2020 from [place] skilled nursing facility where he had been for an extended time for events from COVID-19 testing + 04/20/2020 and issues related to his gallbladder and essentially was bedbound developing a sacral decubitus as well as bilateral heel wounds.  On admission patient with increasing shortness of breath as well as low-grade fever 100.9.  Sacral ulcer did have a foul-smelling odor.  Admission chemistries lactic acid 0.9, blood cultures no growth to date, BUN 11, creatinine 4.41, WBC 5800, hemoglobin 7.8, pro calcitonin 1.24.  Chest x-ray showed diffuse bilateral airspace disease correlated with COVID-19 status.  MRI of the  left foot area of ulceration with nailbed irregularity at the first digit with findings suggestive of osteomyelitis involving the first digit distal tuft.  No loculated fluid collection or sinus tract and right foot MRI abnormal osseous edema head of the fifth metatarsal suspicious for osteomyelitis.  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  Surgery was consulted in regards to sacral and heel wounds and was receiving hydrotherapy.  Placed on vancomycin and cefepime to be ongoing through 06/10/2020.  Dr. Sharol Given was consulted in regards to unstageable pressure wounds of left lateral foot lateral malleolus right lateral foot and heel dry gangrene of left fourth toe as well as left first metatarsal and right fifth meta tarsal advised Prevalon boots and would follow as outpatient.  Hemodialysis ongoing as per renal services monitoring closely for bouts of hypotension.  Subcutaneous heparin for DVT prophylaxis.  Due to patient's profound deconditioning multimedical issues he was admitted for a comprehensive rehabilitation program. Currently denies pain. AOx3.   Patient currently requires mod with mobility secondary to muscle weakness, decreased cardiorespiratoy endurance, decreased memory and decreased sitting balance, decreased standing balance, decreased postural control and decreased balance strategies.  Prior to hospitalization, patient was independent  with mobility and lived with Family (lives with father) in a House home.  Home access is  Ramped entrance.  Patient will benefit from skilled PT intervention to maximize safe functional mobility, minimize fall risk and decrease caregiver burden for planned discharge home with elderly father who would not be able to assist.  Anticipate patient will benefit from follow up [facility] at discharge.  PT - End of Session Activity Tolerance: Tolerates  30+ min activity with  multiple rests Endurance Deficit: Yes Endurance Deficit Description: pt reported fatigue throughout session PT Assessment Rehab Potential (ACUTE/IP ONLY): Good PT Barriers to Discharge: Decreased caregiver support;Home environment access/layout;Wound Care;Lack of/limited family support;Hemodialysis PT Barriers to Discharge Comments: lives with 50 yr/old father, would need to be independent, multiple wounds PT Patient demonstrates impairments in the following area(s): Balance;Edema;Endurance;Motor;Sensory;Skin Integrity PT Transfers Functional Problem(s): Bed Mobility;Bed to Chair;Car;Furniture PT Locomotion Functional Problem(s): Ambulation;Wheelchair Mobility;Stairs PT Plan PT Intensity: Minimum of 1-2 x/day ,45 to 90 minutes PT Frequency: 5 out of 7 days PT Duration Estimated Length of Stay: 3 weeks PT Treatment/Interventions: Ambulation/gait training;Discharge planning;Functional mobility training;Psychosocial support;Therapeutic Activities;Balance/vestibular training;Disease management/prevention;Neuromuscular re-education;Skin care/wound management;Therapeutic Exercise;Wheelchair propulsion/positioning;Cognitive remediation/compensation;DME/adaptive equipment instruction;Pain management;Splinting/orthotics;UE/LE Strength taining/ROM;Community reintegration;Functional electrical stimulation;Patient/family education;Stair training;UE/LE Coordination activities PT Transfers Anticipated Outcome(s): Mod I PT Locomotion Anticipated Outcome(s): CGA with LRAD PT Recommendation Follow Up Recommendations: Home health PT Patient destination: Home Equipment Recommended: To be determined   PT Evaluation Precautions/Restrictions Precautions Precautions: Fall Precaution Comments: wounds on bilat feet and L sacrum, monitor orthostatic BPs Required Braces or Orthoses: Other Brace Other Brace: abdominal binder Restrictions Weight Bearing Restrictions: No Home Living/Prior Functioning Home  Living Available Help at Discharge: Family (pt lives with 97 yr/old father but would need to be independent upon D/C to go home) Type of Home: House Home Access: Ramped entrance Home Layout: One level Bathroom Shower/Tub: Multimedia programmer: Handicapped height Bathroom Accessibility: Yes Additional Comments: information taken with assist of sister via phone  Lives With: Family (lives with father) Prior Function Level of Independence: Independent with basic ADLs;Independent with transfers;Independent with homemaking with ambulation;Independent with gait  Able to Take Stairs?: Yes Driving: Yes Vocation: On disability Comments: Pt was at SNF prior to admission but his goal is to return home with his dad Cognition Overall Cognitive Status: Impaired/Different from baseline Arousal/Alertness: Awake/alert Orientation Level: Oriented X4 Memory: Impaired Awareness: Appears intact Problem Solving: Appears intact Safety/Judgment: Appears intact Sensation Sensation Light Touch: Impaired by gross assessment Proprioception: Impaired by gross assessment Additional Comments: pt's reports history of neuropathy. Absent sensation along bilateral LE's from slightly above ankle and below. Coordination Gross Motor Movements are Fluid and Coordinated: No Fine Motor Movements are Fluid and Coordinated: Yes Coordination and Movement Description: grossly uncoordinated due to generalized weakness, fatigue, lightheadedness, and decreased balance/postural control Finger Nose Finger Test: [facility] but slow Heel Shin Test: WFL but slow Motor  Motor Motor: Abnormal postural alignment and control Motor - Skilled Clinical Observations: grossly uncoordinated due to generalized weakness, fatigue, lightheadedness, and decreased balance/postural control  Trunk/Postural Assessment  Cervical Assessment Cervical Assessment: Within Functional Limits Thoracic Assessment Thoracic Assessment: Within Functional  Limits Lumbar Assessment Lumbar Assessment: Exceptions to [facility] (posterior pelvic tilt in sitting) Postural Control Postural Control: Deficits on evaluation  Balance Balance Balance Assessed: Yes Static Sitting Balance Static Sitting - Balance Support: Feet supported;Bilateral upper extremity supported Static Sitting - Level of Assistance: 5: Stand by assistance (supervision) Dynamic Sitting Balance Dynamic Sitting - Balance Support: Bilateral upper extremity supported;Feet supported Dynamic Sitting - Level of Assistance: 5: Stand by assistance (supervision) Static Standing Balance Static Standing - Balance Support: Bilateral upper extremity supported (parallel bars) Static Standing - Level of Assistance: 3: Mod assist Extremity Assessment  RLE Assessment RLE Assessment: Exceptions to [facility] RLE Strength Right Hip Flexion: 4/5 Right Hip ADduction: 4-/5 Right Knee Extension: 4-/5 Right Ankle Dorsiflexion: 3+/5 Right Ankle Plantar Flexion: 3+/5 LLE Assessment LLE Assessment: Exceptions to [facility] LLE Strength Left Hip  Flexion: 4-/5 Left Hip ADduction: 4-/5 Left Knee Extension: 3+/5 Left Ankle Dorsiflexion: 3+/5 Left Ankle Plantar Flexion: 3+/5  Care Tool Care Tool Bed Mobility Roll left and right activity   Roll left and right assist level: Supervision/Verbal cueing    Sit to lying activity   Sit to lying assist level: Supervision/Verbal cueing    Lying to sitting edge of bed activity   Lying to sitting edge of bed assist level: Moderate Assistance - Patient 50 - 74%     Care Tool Transfers Sit to stand transfer   Sit to stand assist level: Maximal Assistance - Patient 25 - 49% (parallel bars)    Chair/bed transfer   Chair/bed transfer assist level: Moderate Assistance - Patient 50 - 74% (slideboard)     Psychologist, counselling transfer activity did not occur: Safety/medical concerns (fatigue, lightheadedness, generalized weakness)        Care Tool  Locomotion Ambulation Ambulation activity did not occur: Safety/medical concerns (fatigue, lightheadedness, generalized weakness)        Walk 10 feet activity Walk 10 feet activity did not occur: Safety/medical concerns (fatigue, lightheadedness, generalized weakness)       Walk 50 feet with 2 turns activity Walk 50 feet with 2 turns activity did not occur: Safety/medical concerns (fatigue, lightheadedness, generalized weakness)      Walk 150 feet activity Walk 150 feet activity did not occur: Safety/medical concerns (fatigue, lightheadedness, generalized weakness)      Walk 10 feet on uneven surfaces activity Walk 10 feet on uneven surfaces activity did not occur: Safety/medical concerns (fatigue, lightheadedness, generalized weakness)      Stairs Stair activity did not occur: Safety/medical concerns (fatigue, lightheadedness, generalized weakness)        Walk up/down 1 step activity Walk up/down 1 step or curb (drop down) activity did not occur: Safety/medical concerns (fatigue, lightheadedness, generalized weakness)     Walk up/down 4 steps activity did not occuR: Safety/medical concerns (fatigue, lightheadedness, generalized weakness)  Walk up/down 4 steps activity      Walk up/down 12 steps activity Walk up/down 12 steps activity did not occur: Safety/medical concerns (fatigue, lightheadedness, generalized weakness)      Pick up small objects from floor Pick up small object from the floor (from standing position) activity did not occur: Safety/medical concerns (fatigue, lightheadedness, generalized weakness)      Wheelchair Will patient use wheelchair at discharge?: Yes Type of Wheelchair: Manual   Wheelchair assist level: Supervision/Verbal cueing Max wheelchair distance: 135f  Wheel 50 feet with 2 turns activity   Assist Level: Supervision/Verbal cueing  Wheel 150 feet activity   Assist Level: Minimal Assistance - Patient > 75%    Refer to Care Plan for Long Term  Goals  SHORT TERM GOAL WEEK 1 PT Short Term Goal 1 (Week 1): pt will perform all bed mobility with CGA PT Short Term Goal 2 (Week 1): Pt will transfer sit<>stand with LRAD and mod A PT Short Term Goal 3 (Week 1): Pt will transfer bed<>chair with LRAD and min A  Recommendations for other services: None   Skilled Therapeutic Intervention Evaluation completed (see details above and below) with education on PT POC and goals and individual treatment initiated with focus on functional mobility/transfers, generalized strengthening, improved activity tolerance. Received pt supine in bed, pt educated on PT evaluation, CIR policies, and therapy schedule and agreeable. Pt denied any pain during session. BP in supine 168/97; pt asymptomatic. Pt  rolled L and R with supervision and use of bedrails and required max A to don abdominal binder. Pt transferred supine<>sitting EOB with mod A due to air mattress. BP sitting EOB with abdominal binder donned 138/97; pt reported lightheadedness. Returned to supine with supervision and donned bilateral ace wraps with total A. Supine<>sit with min A and BP sitting with abdominal binder and ace wraps donned: 147/107; pt reported feeling lightheaded but not as bad as when previously sitting up. Pt transferred bed<>WC via slideboard with mod A and total A to place board. Pt required min cues for technique as this was his primary method of transferring at the SNF. BP: 131/96 sitting in WC pt still c/o lightheadedness. Pt performed WC mobility 129f using BUE and supervision to therapy gym but stopped due to UE fatigue. Pt required 2 attempts to stand in // bars with max A and cues for anterior weight shifting and scooting to edge of chair prior to standing. [facility] Plan to sit up in recliner but RN requested to change dressings and requested pt be in bed. Pt transferred WC<>bed via slideboard with mod A and sit<>supine with supervision. [facility]. Pt performed the following exercises supine in bed with supervision and verbal cues for technique: -heel slides x10 bilaterally -ankle circles x20 clockwise bilaterally (decreased ROM on LLE) -SLR x10 bilaterally  -hip adduction towel squeezes 2x10 -hip adduction x10 bilaterally  -glute squeezes x10  Therapist encouraged pt to put bed in chair position for improved upright tolerance. Pt able to get bed into chair position without assist. Concluded session with pt sitting in bed, needs within reach, and safety plan updated.   Mobility Bed Mobility Bed Mobility: Rolling Right;Rolling Left;Sit to Supine;Supine to Sit Rolling Right: Supervision/verbal cueing Rolling Left: Supervision/Verbal cueing Supine to Sit: Moderate Assistance - Patient 50-74% Sit to Supine: Supervision/Verbal cueing Transfers Transfers: Sit to Stand;Lateral/Scoot Transfers Sit to Stand: Maximal Assistance - Patient 25-49% (parallel bars) Lateral/Scoot Transfers: Moderate Assistance - Patient 50-74% (slideboard) Transfer (Assistive device): Other (Comment) (slideboard) Locomotion  Gait Ambulation: No Gait Gait: No Stairs / Additional Locomotion Stairs: No WArchitect Yes Wheelchair Assistance: SChartered loss adjuster Both upper extremities Wheelchair Parts Management: Needs assistance Distance: 122f  Discharge Criteria: Patient will be discharged from PT if patient refuses treatment 3 consecutive times without medical reason, if treatment goals not met, if there is a change in medical status, if patient makes no progress towards goals or if patient is discharged from hospital.  The above assessment, treatment plan, treatment alternatives and goals were discussed and mutually agreed upon: by patient  AnAlfonse AlpersT, DPT  06/07/2020, 12:12 PM

## 2020-06-08 ENCOUNTER — Inpatient Hospital Stay (HOSPITAL_COMMUNITY): Payer: Medicare Other

## 2020-06-08 LAB — RENAL FUNCTION PANEL
Albumin: 2 g/dL — ABNORMAL LOW (ref 3.5–5.0)
Anion gap: 10 (ref 5–15)
BUN: 55 mg/dL — ABNORMAL HIGH (ref 6–20)
CO2: 25 mmol/L (ref 22–32)
Calcium: 8.8 mg/dL — ABNORMAL LOW (ref 8.9–10.3)
Chloride: 95 mmol/L — ABNORMAL LOW (ref 98–111)
Creatinine, Ser: 7.02 mg/dL — ABNORMAL HIGH (ref 0.61–1.24)
GFR, Estimated: 9 mL/min — ABNORMAL LOW (ref >60)
Glucose, Bld: 100 mg/dL — ABNORMAL HIGH (ref 70–99)
Phosphorus: 7 mg/dL — ABNORMAL HIGH (ref 2.5–4.6)
Potassium: 5.2 mmol/L — ABNORMAL HIGH (ref 3.5–5.1)
Sodium: 130 mmol/L — ABNORMAL LOW (ref 135–145)

## 2020-06-08 LAB — CBC
HCT: 31.1 % — ABNORMAL LOW (ref 39.0–52.0)
Hemoglobin: 9.4 g/dL — ABNORMAL LOW (ref 13.0–17.0)
MCH: 26.2 pg (ref 26.0–34.0)
MCHC: 30.2 g/dL (ref 30.0–36.0)
MCV: 86.6 fL (ref 80.0–100.0)
Platelets: 257 10*3/uL (ref 150–400)
RBC: 3.59 MIL/uL — ABNORMAL LOW (ref 4.22–5.81)
RDW: 16.6 % — ABNORMAL HIGH (ref 11.5–15.5)
WBC: 8.7 10*3/uL (ref 4.0–10.5)
nRBC: 0 % (ref 0.0–0.2)

## 2020-06-08 LAB — GLUCOSE, CAPILLARY
Glucose-Capillary: 85 mg/dL (ref 70–99)
Glucose-Capillary: 112 mg/dL — ABNORMAL HIGH (ref 70–99)
Glucose-Capillary: 103 mg/dL — ABNORMAL HIGH (ref 70–99)
Glucose-Capillary: 127 mg/dL — ABNORMAL HIGH (ref 70–99)

## 2020-06-08 MED ORDER — INSULIN GLARGINE 100 UNIT/ML ~~LOC~~ SOLN
11.0000 [IU] | Freq: Every day | SUBCUTANEOUS | Status: DC
  Administered 2020-06-08 – 2020-07-15 (×38): 11 [IU] via SUBCUTANEOUS
  Filled 2020-06-08 (×40): qty 0.11

## 2020-06-08 MED ORDER — VANCOMYCIN HCL IN DEXTROSE 1-5 GM/200ML-% IV SOLN
INTRAVENOUS | Status: AC
  Administered 2020-06-08: 1000 mg via INTRAVENOUS
  Filled 2020-06-08: qty 200

## 2020-06-08 MED ORDER — AMLODIPINE BESYLATE 2.5 MG PO TABS
2.5000 mg | ORAL_TABLET | Freq: Every day | ORAL | Status: DC
  Administered 2020-06-08 – 2020-06-09 (×2): 2.5 mg via ORAL
  Filled 2020-06-08 (×2): qty 1

## 2020-06-08 MED ORDER — HEPARIN SODIUM (PORCINE) 1000 UNIT/ML IJ SOLN
INTRAMUSCULAR | Status: AC
  Filled 2020-06-08: qty 1

## 2020-06-08 MED ORDER — LOPERAMIDE HCL 2 MG PO CAPS
2.0000 mg | ORAL_CAPSULE | Freq: Three times a day (TID) | ORAL | Status: DC
  Administered 2020-06-08 – 2020-06-09 (×4): 2 mg via ORAL
  Filled 2020-06-08 (×7): qty 1

## 2020-06-08 NOTE — Progress Notes (Addendum)
[facility] PHYSICAL MEDICINE & REHABILITATION PROGRESS NOTE   Subjective/Complaints:  Pt reports feels better than yesterday. Said only had 1.5 hours of therapy yesterday- for some reason.   NO BM in 3 days! But pt wary of decreasing/stopping imodium due to liquid stools had before.   TO get labs in HD today?   ROS:  Pt denies SOB, abd pain, CP, N/V/C/D, and vision changes   Objective:   No results found. Recent Labs    06/05/20 1839 06/06/20 0159  WBC 9.0 8.1  HGB 9.5* 8.8*  HCT 31.5* 29.5*  PLT 245 240   Recent Labs    06/06/20 0159 06/06/20 1453  NA 136 135  K 3.7 4.4  CL 98 97*  CO2 29 26  GLUCOSE 143* 129*  BUN 20 33*  CREATININE 4.05* 4.91*  CALCIUM 8.1* 8.6*    Intake/Output Summary (Last 24 hours) at 06/08/2020 [id number] Last data filed at 06/08/2020 0730 Gross per 24 hour  Intake 220 ml  Output --  Net 220 ml     Pressure Injury 05/01/20 Buttocks Left Stage 4 - Full thickness tissue loss with exposed bone, tendon or muscle. packed with santyl gauze, dressed by WOCN, (Active)  05/01/20 0230  Location: Buttocks  Location Orientation: Left  Staging: Stage 4 - Full thickness tissue loss with exposed bone, tendon or muscle.  Wound Description (Comments): packed with santyl gauze, dressed by WOCN,  Present on Admission: Yes     Pressure Injury 05/01/20 Ankle Left;Lateral Unstageable - Full thickness tissue loss in which the base of the injury is covered by slough (yellow, tan, gray, green or brown) and/or eschar (tan, brown or black) in the wound bed. smal circular wound on the (Active)  05/01/20 0230  Location: Ankle  Location Orientation: Left;Lateral  Staging: Unstageable - Full thickness tissue loss in which the base of the injury is covered by slough (yellow, tan, gray, green or brown) and/or eschar (tan, brown or black) in the wound bed.  Wound Description (Comments): smal circular wound on the bone  Present on Admission: Yes     Pressure Injury  05/01/20 Foot Left;Posterior Unstageable - Full thickness tissue loss in which the base of the injury is covered by slough (yellow, tan, gray, green or brown) and/or eschar (tan, brown or black) in the wound bed. 4th and 5th toes appear n (Active)  05/01/20 0230  Location: Foot (4th and 5th toes and lateral/anterior side of the sole)  Location Orientation: Left;Posterior  Staging: Unstageable - Full thickness tissue loss in which the base of the injury is covered by slough (yellow, tan, gray, green or brown) and/or eschar (tan, brown or black) in the wound bed.  Wound Description (Comments): 4th and 5th toes appear necrotic circular wound on sole  Present on Admission: Yes     Pressure Injury 05/01/20 Foot Right;Medial;Posterior Unstageable - Full thickness tissue loss in which the base of the injury is covered by slough (yellow, tan, gray, green or brown) and/or eschar (tan, brown or black) in the wound bed. large circular wo (Active)  05/01/20 0230  Location: Foot  Location Orientation: Right;Medial;Posterior  Staging: Unstageable - Full thickness tissue loss in which the base of the injury is covered by slough (yellow, tan, gray, green or brown) and/or eschar (tan, brown or black) in the wound bed.  Wound Description (Comments): large circular wound on sole  and heel  Present on Admission: Yes     Pressure Injury 05/08/20 Heel Right Unstageable -  Full thickness tissue loss in which the base of the injury is covered by slough (yellow, tan, gray, green or brown) and/or eschar (tan, brown or black) in the wound bed. (Active)  05/08/20 1718  Location: Heel  Location Orientation: Right  Staging: Unstageable - Full thickness tissue loss in which the base of the injury is covered by slough (yellow, tan, gray, green or brown) and/or eschar (tan, brown or black) in the wound bed.  Wound Description (Comments):   Present on Admission: Yes    Physical Exam: Vital Signs Blood pressure (!) 164/101,  pulse 86, temperature 98 F (36.7 C), temperature source Oral, resp. rate 20, height '6\' 6"'$  (1.981 m), weight (!) 160.1 kg, SpO2 100 %.   Physical Exam Gen: laying supine in bed;  Appropriate, still sleepy, NAD- c/o less nausea HEENT: oral mucosa pink and moist, NCAT Cardio: RRR Chest: CTA B/L- no W/R/R- good air movement Abd: Soft, NT, ND, (+)BS  Ext: no edema Psych: sleepy Skin:    Comments: Sacral decubitus as well as bilateral heel dressings are in place change routinely per wound care nurse  Neurological: Ox2-3 this AM- but sleepy    Comments: Patient is alert in no acute distress. 5/5 strength throughout.   Follows simple commands.      Assessment/Plan: 1. Functional deficits which require 3+ hours per day of interdisciplinary therapy in a comprehensive inpatient rehab setting.  Physiatrist is providing close team supervision and 24 hour management of active medical problems listed below.  Physiatrist and rehab team continue to assess barriers to discharge/monitor patient progress toward functional and medical goals  Care Tool:  Bathing    Body parts bathed by patient: Right arm,Left arm,Chest,Abdomen,Front perineal area,Buttocks,Right upper leg,Left upper leg,Face   Body parts bathed by helper: Right lower leg,Left lower leg,Buttocks     Bathing assist Assist Level: Moderate Assistance - Patient 50 - 74%     Upper Body Dressing/Undressing Upper body dressing   What is the patient wearing?: Pull over shirt    Upper body assist Assist Level: Supervision/Verbal cueing    Lower Body Dressing/Undressing Lower body dressing      What is the patient wearing?: Pants,Incontinence brief     Lower body assist Assist for lower body dressing: Maximal Assistance - Patient 25 - 49%     Toileting Toileting    Toileting assist Assist for toileting: Maximal Assistance - Patient 25 - 49% Assistive Device Comment:  (urinal)   Transfers Chair/bed transfer  Transfers  assist     Chair/bed transfer assist level: Moderate Assistance - Patient 50 - 74% (slideboard)     Locomotion Ambulation   Ambulation assist   Ambulation activity did not occur: Safety/medical concerns (fatigue, lightheadedness, generalized weakness)          Walk 10 feet activity   Assist  Walk 10 feet activity did not occur: Safety/medical concerns (fatigue, lightheadedness, generalized weakness)        Walk 50 feet activity   Assist Walk 50 feet with 2 turns activity did not occur: Safety/medical concerns (fatigue, lightheadedness, generalized weakness)         Walk 150 feet activity   Assist Walk 150 feet activity did not occur: Safety/medical concerns (fatigue, lightheadedness, generalized weakness)         Walk 10 feet on uneven surface  activity   Assist Walk 10 feet on uneven surfaces activity did not occur: Safety/medical concerns (fatigue, lightheadedness, generalized weakness)  Wheelchair     Assist Will patient use wheelchair at discharge?: Yes Type of Wheelchair: Manual    Wheelchair assist level: Supervision/Verbal cueing Max wheelchair distance: 150f    Wheelchair 50 feet with 2 turns activity    Assist        Assist Level: Supervision/Verbal cueing   Wheelchair 150 feet activity     Assist      Assist Level: Minimal Assistance - Patient > 75%   Blood pressure (!) 164/101, pulse 86, temperature 98 F (36.7 C), temperature source Oral, resp. rate 20, height '6\' 6"'$  (1.981 m), weight (!) 160.1 kg, SpO2 100 %.  Medical Problem List and Plan: 1.  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              -ELOS/Goals: MinA 2-3 weeks. 2.  Antithrombotics: -DVT/anticoagulation: Subcutaneous heparin.  Check vascular study             -antiplatelet therapy: N/A 3. Pain Management: Tylenol as  needed 4. Mood: Provide emotional support             -antipsychotic agents: N/A 5. Neuropsych: This patient is? capable of making decisions on his own behalf.  1/17- pt appeared slightly confused but amiable this AM- will monitor 6. Skin/Wound Care: Routine skin checks 7. Fluids/Electrolytes/Nutrition: Routine in and outs with follow-up chemistries 8.  End-stage renal disease.  Hemodialysis per renal services 9.  Sacral decubitus ulcer as well as ischemic ulcers on the left foot fourth toe and fifth metatarsal head and right heel and fifth metatarsal head.  Wound care nurse follow-up.  Patient to see Dr. DSharol Givenoutpatient.  Continue bilateral PRAFO's. 10.  ID/ coccygeal osteomyelitis.  Continue vancomycin and cefepime through 06/10/2020  1/17- WBC 8.1  1/18- will schedule turning q2 hours to get off wounds- also will m,ake sure on specialty bed 10.  Anemia of chronic disease.  Continue Aranesp. Hgb 8.8 on 1/16, repeat with HD  1/17- no CBC today, only BMP- will try and check with HD on Tuesday.  11.  Diabetes mellitus.  Hemoglobin A1c 5.6.  Lantus insulin 14 units nightly             1/16: CBGs 108-167: continue current regimen.   1/17- BGs 102-160- con't regimen  1/18- per pharmacy, want to decrease his insulin due to fasting BG of 85- will decrease to 11 units, and wasn't on insulin at home. Based on ESRD, will NEED insulin at home.  12.  Morbid obesity.  BMI 38.37.  Dietary follow-up  13. Orthostatic hypotension  1/17- Sx's so significant, vomited this AM when got up- will try abd binder and ACE wraps for LEs- since due to osteomyelitis, don't think can use TEDs- if need be, can add Midodrine, but due to ESRD, cannot start Florinef.  1/18- BP laying is 164/101 this AM- might not do well with midodrine? 14. Diarrhea with new constipation  1/18- LBM 3 days ago- on imodium 2 mg q4 hours around the clock- will change to q8 hours to see if can get him to have BM.    LOS: 2 days A FACE TO FACE  EVALUATION WAS PERFORMED  Fred [name] 06/08/2020, 9:09 AM

## 2020-06-08 NOTE — Progress Notes (Signed)
Pharmacy Antibiotic Note- follow up   Fred [name] is a 50 y.o. male admitted on 06/06/2020 with coccygeal osteomyelitis.  Pharmacy consulted for vancomycin and cefepime dosing. ID planning to treat x 6 weeks through 06/10/20. Patient was previously on MWF HD and has been transitioned back to TTS.  Tolerating HD sessions.  Patient was dialyzed 1/14 and 1/15 to get back on schedule. Vancomycin not given after 1/15 HD. Next HD 1/18, antibiotics ordered accordingly. End date 1/20  Plan: Continue vancomycin 1 g IV QHD-TTS Continue cefepime 1 g IV QHD-TTS Monitor HD tolerance, clinical progress, PRN vanc levels Last day 06/10/20   Height: '6\' 6"'$  (198.1 cm) Weight: (!) 160.1 kg (353 lb) IBW/kg (Calculated) : 91.4  Temp (24hrs), Avg:98.3 F (36.8 C), Min:98 F (36.7 C), Max:98.5 F (36.9 C)  Recent Labs  Lab 06/01/20 0921 06/02/20 0200 06/03/20 0236 06/04/20 0328 06/05/20 0901 06/05/20 1839 06/06/20 0159 06/06/20 1453  WBC  --   --  10.4  --   --  9.0 8.1  --   CREATININE  --    < > 6.06* 7.64* 5.90*  --  4.05* 4.91*  VANCORANDOM 24  --   --   --   --   --   --   --    < > = values in this interval not displayed.    Estimated Creatinine Clearance: 30.6 mL/min (A) (by C-G formula based on SCr of 4.91 mg/dL (H)).    Allergies  Allergen Reactions  . Atorvastatin Hives    NOT on [facility]   12/19 Vanc >> (1/20) 12/9 Cefepime >> (1/20) 12/9 Remdesivir >> 12/14  12/20 pre-HD VL: 18 mcg/mL on 1g qMWF >> no change 12/30 pre HD VL: 23 mcg/mL on 1g qTTS >> no change  1/8 VR: 31 mcg/mL (drawn after HD and vanc dose >> inaccurate) 1/11 pre-HD VL: 24 mcg/mL >> no change  12/9 BCx: negativeF 12/9 COVID: Positive   Benetta Spar, PharmD, BCPS, BCCP Clinical Pharmacist  Please check AMION for all [facility] phone numbers After 10:00 PM, call [place] [phone]

## 2020-06-08 NOTE — Progress Notes (Signed)
Physical Therapy Session Note  [name] MRN: [id number] Date of Birth: Dec 11, 1970  Today's Date: 06/08/2020 PT Individual Time: [id number] and 1046-1130 PT Individual Time Calculation (min): 39 min and 44 min PT Missed Time: 31 minutes due to nausea, dizziness, and lightheadedness  Short Term Goals: Week 1:  PT Short Term Goal 1 (Week 1): pt will perform all bed mobility with CGA PT Short Term Goal 2 (Week 1): Pt will transfer sit<>stand with LRAD and mod A PT Short Term Goal 3 (Week 1): Pt will transfer bed<>chair with LRAD and min A  Skilled Therapeutic Interventions/Progress Updates:   Treatment Session 1: [id number] 39 min Received pt supine in bed, pt agreeable to therapy, and denied any pain during session but reported not sleeping well last night and feeling fatigued. Session with emphasis on functional mobility/transfers, generalized strengthening, upright tolerance, and improved endurance with activity. BP in supine at rest: 200/90 taken at lower L forearm per pt request. Moved BP cuff to brachial artery and BP: 179/110 and pt asymptomatic. Notifed RN and RN notified MD and PA, PA cleared pt to continue with therapy. Pt rolled L and R with supervision and use of bedrails and donned abdomnial binder and ace wraps in supine with max/total A. BP: 198/112 in supine with abdominal binder and ace wraps on. Pt transferred supine<>sitting EOB with min A. BP in sitting: 141/105 and pt reported his "jaws were tingling" but otherwise asymptomatic. Pt transferred bed<>recliner via slideboard with min A and total A to place board. Concluded session with pt sitting in recliner, needs within reach, and seatbelt alarm on. NT present at bedside.    Treatment Session 2: 1046-1130 44 min Received pt sitting in recliner talking with sister on phone, pt agreeable to therapy, and denied any pain during session. Session with emphasis on functional mobility/transfers, generalized  strengthening, dynamic standing balance/coordination, and improved endurance with activity. Abdominal binder and bilateral ace wraps donned during session. BP sitting in recliner: 122/94; pt reported mild symptoms along occiput. Pt transferred recliner<>WC via slideboard with min/mod A with total A to place board and performed WC mobility 14f using BUE and supervision to therapy gym with 2 rest breaks due to UE fatigue. In parallel bars, pt required 3 attempts to rise into standing with heavy max A and cues for anterior weight shifting. Pt able to remain standing ~30 seconds prior to requesting to sit. Pt reported 8/10 fatigue but motivated to continue session. [facility] and performed BLE strengthening on Kinetron at 20 cm/sec for 1 minute x 1 trial with emphasis on glute/quad strengthening. Pt currently c/o of increased dizziness and stated "it feels like all the blood is rushing to the back of my head" and pt began to slump forward. BP 108/84. Pt then reported feeling nauseous and therapist provided emesis bag. Pt with no episodes of emesis but spitting and burping heavily. [facility] and [facility] WC<>bed via slideboard with mod A and sit<>supine with min A for LLE management due to not feeling well. [facility] and use of bed features. Concluded session with pt supine in bed, needs within reach, and NT present checking blood glucose. RN and PA made aware. 31 minutes missed of skilled physical therapy due to nausea, dizziness, and fatigue.   Therapy Documentation Precautions:  Precautions Precautions: Fall Precaution Comments: wounds on bilat feet and L sacrum, monitor orthostatic BPs  Required Braces or Orthoses: Other Brace Other Brace: abdominal binder Restrictions Weight Bearing Restrictions: No  Therapy/Group: Individual Therapy Alfonse Alpers PT, DPT   06/08/2020, 7:06 AM

## 2020-06-08 NOTE — Progress Notes (Signed)
Inpatient Rehabilitation Care Coordinator Assessment and Plan [name] MRN: [id number] Date of Birth: 12/10/1970  Today's Date: 06/08/2020  Hospital Problems: Principal Problem:   Critical illness myopathy  Past Medical History:  Past Medical History:  Diagnosis Date  . Chronic kidney disease    HD MWF  . Diabetes mellitus without complication ([place])   . Hypertension    Past Surgical History:  Past Surgical History:  Procedure Laterality Date  . CHOLECYSTECTOMY     Social History:  reports that he has never smoked. He has never used smokeless tobacco. He reports that he does not drink alcohol and does not use drugs.  Family / Support Systems Marital Status: Single Patient Roles: Other (Comment) (sibling and son) Other Supports: [name] nance-sister [phone]-cell  [place]'s Anticipated Caregiver: Sister and Dad Ability/Limitations of Caregiver: Dad is 29 yo and has his own health issues can not provide assist. Sister works and owns a Engineer, maintenance (IT): 24/7 Family Dynamics: Close with sister, dad and his nieces all will do what they can but really need him to be on his feet and not in a wheelchair. He just wants to go home from here  Social History Preferred language: English Religion:  Cultural Background: No issues Education: HS Read: Yes Write: Yes Employment Status: Disabled Public relations account executive Issues: No issues Guardian/Conservator: none-according to MD pt is capable of making his own decisions while here. Wants sister included in any decision   Abuse/Neglect Abuse/Neglect Assessment Can Be Completed: Yes Physical Abuse: Denies Verbal Abuse: Denies Sexual Abuse: Denies Exploitation of patient/patient's resources: Denies Self-Neglect: Denies  Emotional Status Pt's affect, behavior and adjustment status: Pt is motivated to do well and get back on his feet. It has been a long road since 01/2020 when all of this  started. He will do his part and his family will help with what they can do for him. Recent Psychosocial Issues: other health issues Psychiatric History: NO history deferred depression screen due to pt coping as well as expected with his long hospitalization. He would benefit from seeing neuro-psych while here, since such long hospitalization and not having been home since 01/2020 Substance Abuse History: NO issues  Patient / Family Perceptions, Expectations & Goals Pt/Family understanding of illness & functional limitations: Pt and sister can explain his health issues, ESRD, wounds and COVID and have spoken with the MD and feel they have a good understanding of his condition and plans to move forward. Premorbid pt/family roles/activities: Brother, uncle, son, dialysis pt, etc Anticipated changes in roles/activities/participation: resume Pt/family expectations/goals: Pt states: " I want to get back home with my Dad."  Sister states: " We need him to be back on his feet I don not think a wheelchair will fit in my Dad's home."  [employer]: Other (Comment) ([facility] and resident of [name] prior to admission) Premorbid Home Care/DME Agencies: Other (Comment) Transportation available at discharge: family Resource referrals recommended: Neuropsychology  Discharge Planning Living Arrangements: Other (Comment) (SNF-[name]) Support Systems: Parent,Other relatives,Friends/neighbors Type of Residence: [place] Name: [place]: Medicare,Medicaid (specify county) Financial Resources: [employer] Support Financial Screen Referred: No Living Expenses: Other (Comment) (Going to SNF) Money Management: Family Does the patient have any problems obtaining your medications?: No Home Management: Self and Dad when was at home Patient/Family Preliminary Plans: Pt wants to return home with Dad  but needs to be supervision and ambulatory for this to happen.  Sister plans to wait and see how he progresses in therapies before determining if he will need to return to the SNF before going back home with Dad. Dad does havre a ramp at home. Care Coordinator Barriers to Discharge: Decreased caregiver support,Wound Care,Weight Care Coordinator Barriers to Discharge Comments: Pt does not have care at discharge except supervision, he is a big man and the wheelchair would be large and heavy. He has multiple wounds Care Coordinator Anticipated Follow Up Needs: SNF,HH/OP  Clinical Impression Pleasant motivated gentleman who is willing to work in therapies to finally get back home with his Dad. His dad and sister are involved, but Dad can not offer assist due to own health issues and he is 12 yo. Sister works and can do some assist. They really need pt ambulating due to he is 6'6 and 345 lbs, so home would not be wheelchair accessible and the wheelchair would be too heavy to lift. Pt has been in a SNF for the past 45 days and back and forth to the hospital since 01/2020. He will need adequate time to heal and recover from his illnesses.  Elease Hashimoto 06/08/2020, 3:06 PM

## 2020-06-08 NOTE — Progress Notes (Signed)
[facility] Progress Note   Subjective:   Patient seen and examined at bedside.  Reports PT going well.  Denies CP, SOB, edema, orthopnea, weakness, dizziness and fatigue.   Objective Vitals:   06/07/20 0411 06/07/20 1433 06/07/20 1945 06/08/20 0309  BP: (!) 149/97 (!) 179/90 (!) 186/79 (!) 164/101  Pulse: 89 92 92 86  Resp: '17 18 15 20  '$ Temp: 98.9 F (37.2 C) 98.5 F (36.9 C) 98.4 F (36.9 C) 98 F (36.7 C)  TempSrc: Oral Oral Oral Oral  SpO2: 99% 99% 96% 100%  Weight: (!) 160.1 kg     Height:       Physical Exam General:WDWN male in NAD Heart:RRR, no mrg Lungs:CTAB, nml WOB Abdomen:soft, NTND Extremities:no LE edema Dialysis Access: [facility]   Filed Weights   06/06/20 1256 06/06/20 1436 06/07/20 0411  Weight: (!) 158.8 kg (!) 158.8 kg (!) 160.1 kg    Intake/Output Summary (Last 24 hours) at 06/08/2020 1323 Last data filed at 06/08/2020 0730 Gross per 24 hour  Intake 120 ml  Output --  Net 120 ml    Additional Objective Labs: Basic Metabolic Panel: Recent Labs  Lab 06/02/20 0200 06/03/20 0236 06/05/20 0901 06/06/20 0159 06/06/20 1453  NA 137   < > 136 136 135  K 4.2   < > 4.4 3.7 4.4  CL 98   < > 99 98 97*  CO2 28   < > '25 29 26  '$ GLUCOSE 154*   < > 115* 143* 129*  BUN 28*   < > 32* 20 33*  CREATININE 4.83*   < > 5.90* 4.05* 4.91*  CALCIUM 8.5*   < > 8.6* 8.1* 8.6*  PHOS 5.4*  --   --   --  5.6*   < > = values in this interval not displayed.   Liver Function Tests: Recent Labs  Lab 06/06/20 1453  ALBUMIN 1.9*   CBC: Recent Labs  Lab 06/03/20 0236 06/05/20 1839 06/06/20 0159  WBC 10.4 9.0 8.1  HGB 9.5* 9.5* 8.8*  HCT 30.4* 31.5* 29.5*  MCV 86.1 87.5 88.3  PLT 269 245 240   CBG: Recent Labs  Lab 06/07/20 1156 06/07/20 1612 06/07/20 2055 06/08/20 0606 06/08/20 1128  GLUCAP 131* 146* 90 85 112*    Lab Results  Component Value Date   INR 1.2 04/29/2020   Studies/Results: No results found.  Medications: . ceFEPime  (MAXIPIME) IV    . vancomycin     . (feeding supplement) PROSource Plus  30 mL Oral TID BM  . Chlorhexidine Gluconate Cloth  6 each Topical Daily  . [START ON 06/15/2020] darbepoetin (ARANESP) injection - DIALYSIS  200 mcg Intravenous Q Tue-HD  . famotidine  20 mg Oral QHS  . feeding supplement (NEPRO CARB STEADY)  237 mL Oral BID BM  . heparin injection (subcutaneous)  7,500 Units Subcutaneous Q8H  . insulin aspart  0-9 Units Subcutaneous TID WC  . insulin glargine  11 Units Subcutaneous QHS  . loperamide  2 mg Oral Q8H  . metoCLOPramide  5 mg Oral TID AC  . multivitamin  1 tablet Oral QHS    Dialysis Orders: NWTTS 4h 400/800 143.5kg 3K/2.5Ca TDCHeparin3000units IV TIW - Mircera136mgIVq2wks - last 12/8 - Venofer '100mg'$ IVqHD x10 - completed 6/10 doses  Problem/Plan: 1. Sacral decub/coccygealosteo: On Vanc/Cefepime x 6 weeks (thru 06/10/20) - ID following.Continues hydrotherapy.  Now in CIR 2. 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 3. ESRD: on  HD TTS.HD off schedule last week due to high patient volume/staffing issues.  Plan for HD today per regular schedule. Truncated HD due to high patient volume/staffing issues.  4. Diarrhea -Resolved after stopping renvela.  5. BP/volume:BPstarting to rise up some systolic and diastolic. had issues with hypotension which improved once amlodipine and metoprolol d/c'd. Keep SBP >100 on HD.Has had problems withorthostatic hypotension not helping rehab,appears euvolemic on exam.+diarrhea,planrun even with HD let volume come up.Follow-up trend.  Restarted on low dose amlodipine at bedtime. 6. COVID PNA- tested positive on 11/30, is off isolation now.  7. Anemiaof CKD- last Hgb8.8,cont Aranesp 279mgqSat 8. Secondary Hyperparathyroidism: CorrCa9.9, phos5.6on 1/16 not on VDRA. Stopped renvela 2/2diarrhea follow-up next phosphorus before starting different  binder?Fosrenolpossibly.  9. Nutrition: last Alb1.9on 1/16, continue Nepro + pro-source supplements.On carb modified diet.With potassium, phosphorus stable / volume stable currently, follow labs. 10. DM T2 - per admit   LJen Mow PA-C [facility] 06/08/2020,1:23 PM  LOS: 2 days

## 2020-06-08 NOTE — Progress Notes (Signed)
Inpatient Rehabilitation Center Individual Statement of Services  Patient Name:  Fred [name]  Date:  06/08/2020  Welcome to the [facility].  Our goal is to provide you with an individualized program based on your diagnosis and situation, designed to meet your specific needs.  With this comprehensive rehabilitation program, you will be expected to participate in at least 3 hours of rehabilitation therapies Monday-Friday, with modified therapy programming on the weekends.  Your rehabilitation program will include the following services:  Physical Therapy (PT), Occupational Therapy (OT), 24 hour per day rehabilitation nursing, Neuropsychology, Care Coordinator, Rehabilitation Medicine, Nutrition Services and Pharmacy Services  Weekly team conferences will be held on Tuesday to discuss your progress.  Your Inpatient Rehabilitation Care Coordinator will talk with you frequently to get your input and to update you on team discussions.  Team conferences with you and your family in attendance may also be held.  Expected length of stay: 3 weeks  Overall anticipated outcome: supervision-CGA level  Depending on your progress and recovery, your program may change. Your Inpatient Rehabilitation Care Coordinator will coordinate services and will keep you informed of any changes. Your Inpatient Rehabilitation Care Coordinator's name and contact numbers are listed  below.  The following services may also be recommended but are not provided by the [facility] will be made to provide these services after discharge if needed.  Arrangements include referral to agencies that provide these services.  Your insurance has been verified to be:   Your primary doctor is:  None  Pertinent information will be shared with your doctor and your insurance  company.  Inpatient Rehabilitation Care Coordinator:  Ovidio Kin, [place] or Emilia Beck  Information discussed with and copy given to patient by: Elease Hashimoto, 06/08/2020, 11:15 AM

## 2020-06-08 NOTE — Progress Notes (Signed)
Physical Therapy Note  [name] MRN: [id number] Date of Birth: June 04, 1970 Today's Date: 06/08/2020    Patient missed 60 minutes skilled PT due to off the floor for HD.   Reginia Naas 06/08/2020, 5:05 PM

## 2020-06-08 NOTE — Progress Notes (Addendum)
Inpatient Diabetes Program Recommendations  AACE/ADA: New Consensus Statement on Inpatient Glycemic Control (2015)  Target Ranges:  Prepandial:   less than 140 mg/dL      Peak postprandial:   less than 180 mg/dL (1-2 hours)      Critically ill patients:  140 - 180 mg/dL   Results for JONAVIN, SKURA (MRN [id number]) as of 06/08/2020 12:19  Ref. Range 06/07/2020 06:19 06/07/2020 11:56 06/07/2020 16:12 06/07/2020 20:55  Glucose-Capillary Latest Ref Range: 70 - 99 mg/dL 102 (H) 131 (H)  1 unit NOVOLOG  146 (H)  1 unit NOVOLOG  90    14 units LANTUS   Results for SHIGEKI, FURNO (MRN [id number]) as of 06/08/2020 12:19  Ref. Range 06/08/2020 06:06 06/08/2020 11:28  Glucose-Capillary Latest Ref Range: 70 - 99 mg/dL 85 112 (H)   Results for KHOLTON, BARTOLOTTA (MRN [id number]) as of 06/08/2020 12:19  Ref. Range 05/04/2020 05:46  Hemoglobin A1C Latest Ref Range: 4.8 - 5.6 % 5.6    Admit 12/09 with COVID/ Sacral Decubitus  History: DM, ESRD  SNF DM Meds: Novolog 0-9 units TID      Lantus 14 units QHS  Current Orders: Lantus 11 units QHS      Novolog Sensitive Correction Scale/ SSI (0-9 units) TID AC     Transferred to Rehab 01/16  MD- Note that Lantus reduced to 11 units QHS for tonight--Agree since CBG was only 85 this AM after getting 14 units Lantus last PM.  Requiring very little Novolog SSI as well.  Perhaps we could try stopping the Lantus completely but continue the Novolog SSI for now as a back up.  Given his A1c was only 5.6% on admission in December, maybe he could try oral diabetes med?  Could try adding Tradjenta 5 mg Daily (DPP-4 inhibitor medication).  Tradjenta does not have to be renally dosed and has lower risk of Hypoglycemia for pts with ESRD (versus sulfonylurea).   Addendum 2pm--Attempted to speak with pt this afternoon however transport team was taking pt to dialysis when I arrived to his room.  Will re-attempt visit with pt tomorrow.     --Will follow patient during  hospitalization--  Wyn Quaker RN, MSN, CDE Diabetes Coordinator Inpatient Glycemic Control Team Team Pager: [phone] (8a-5p)

## 2020-06-09 ENCOUNTER — Inpatient Hospital Stay (HOSPITAL_COMMUNITY): Payer: Medicare Other | Admitting: Occupational Therapy

## 2020-06-09 ENCOUNTER — Inpatient Hospital Stay (HOSPITAL_COMMUNITY): Payer: Medicare Other

## 2020-06-09 LAB — GLUCOSE, CAPILLARY
Glucose-Capillary: 88 mg/dL (ref 70–99)
Glucose-Capillary: 116 mg/dL — ABNORMAL HIGH (ref 70–99)
Glucose-Capillary: 111 mg/dL — ABNORMAL HIGH (ref 70–99)
Glucose-Capillary: 127 mg/dL — ABNORMAL HIGH (ref 70–99)

## 2020-06-09 MED ORDER — LANTHANUM CARBONATE 500 MG PO CHEW
500.0000 mg | CHEWABLE_TABLET | Freq: Three times a day (TID) | ORAL | Status: DC
  Administered 2020-06-09 – 2020-07-15 (×71): 500 mg via ORAL
  Filled 2020-06-09 (×115): qty 1

## 2020-06-09 MED ORDER — SODIUM CHLORIDE 0.9 % IV SOLN
2.0000 g | Freq: Once | INTRAVENOUS | Status: DC
  Filled 2020-06-09: qty 2

## 2020-06-09 MED ORDER — SODIUM CHLORIDE 0.9 % IV SOLN
2.0000 g | INTRAVENOUS | Status: AC
  Administered 2020-06-10: 2 g via INTRAVENOUS
  Filled 2020-06-09: qty 2

## 2020-06-09 MED ORDER — SORBITOL 70 % SOLN
60.0000 mL | Freq: Once | Status: AC
  Administered 2020-06-09: 60 mL via ORAL
  Filled 2020-06-09: qty 60

## 2020-06-09 NOTE — Progress Notes (Signed)
Occupational Therapy Session Note  [name] MRN: [id number] Date of Birth: Jan 07, 1971  Today's Date: 06/09/2020 OT Individual Time: 1130-1155 OT Individual Time Calculation (min): 25 min    Short Term Goals: Week 1:  OT Short Term Goal 1 (Week 1): patient will completed bed mobility with CS OT Short Term Goal 2 (Week 1): patient will complete sit pivot transfers with min A OT Short Term Goal 3 (Week 1): patient will complete lower body bathing and dressing with min A and use of assistive devices as needed OT Short Term Goal 4 (Week 1): patient will tolerate sitting out of bed for 3-4 hours   Skilled Therapeutic Interventions/Progress Updates:    Pt greeted at time of session supine in bed resting talking on phone with sister, few minutes at beginning of session wrapping up phone call. No pain. Supine to sit EOB Supervision with bed features and BP 134/103 EOB. Initially going to attempt transfer to wheelchair but decided against for time constraints, focus on standing instead this session. Stedy retrieved and cleaned, pt set up for sit to stand and NT entered to take BS. After, sit to stand at [facility] of 1, able to static stand for 30 seconds before return to sitting. Mild nausea reported, RN present providing nausea meds at end of session. Sit to supine Supervision, call bell in reach all needs met.   Therapy Documentation Precautions:  Precautions Precautions: Fall Precaution Comments: wounds on bilat feet and L sacrum, monitor orthostatic BPs Required Braces or Orthoses: Other Brace Other Brace: abdominal binder Restrictions Weight Bearing Restrictions: No     Therapy/Group: Individual Therapy  [name] 06/09/2020, 12:10 PM

## 2020-06-09 NOTE — Progress Notes (Signed)
Patient ID: Fred [name], male   DOB: 01-11-1971, 50 y.o.   MRN: [id number] Spoke with sister via telephone who reports pt has Medicaid also-long term care. She will email the card so can get to admissions.

## 2020-06-09 NOTE — Progress Notes (Signed)
Occupational Therapy Session Note  [name] MRN: [id number] Date of Birth: 1970/12/26  Today's Date: 06/09/2020 OT Individual Time: 2248-2500 OT Individual Time Calculation (min): 45 min    Short Term Goals: Week 1:  OT Short Term Goal 1 (Week 1): patient will completed bed mobility with CS OT Short Term Goal 2 (Week 1): patient will complete sit pivot transfers with min A OT Short Term Goal 3 (Week 1): patient will complete lower body bathing and dressing with min A and use of assistive devices as needed OT Short Term Goal 4 (Week 1): patient will tolerate sitting out of bed for 3-4 hours  Skilled Therapeutic Interventions/Progress Updates:    Pt received supine with no c/o pain, agreeable to OT session. He transferred  To EOB with min A. He completed oral care and grooming, including hair care with set up assist. Pt completed slideboard transfer to the recliner with ROHO cushion on recliner with mod A overall. Special care taken to maintain forward flexion of the trunk to ensure pt was not placing any shearing forces through his posterior wound. After session, discussed with PT who shared that MD verbally recommended against future SB transfers and use of beasy board instead. Shared this with team via sticky note. Pt required increased time for transfer and a block was placed under his feet to increase ease with transfer. BP assessed and listed below. Recliner reclined back significantly and feet raised for BP support, as well as abdominal binder donned. Pt was left sitting up with all needs met.   Seated: 104/79 5 min in recliner, semi reclined: 116/88  Therapy Documentation Precautions:  Precautions Precautions: Fall Precaution Comments: wounds on bilat feet and L sacrum, monitor orthostatic BPs Required Braces or Orthoses: Other Brace Other Brace: abdominal binder Restrictions Weight Bearing Restrictions: No   Therapy/Group: Individual  Therapy  Curtis Sites 06/09/2020, 6:41 AM

## 2020-06-09 NOTE — Progress Notes (Signed)
[facility] Progress Note   Subjective:   Patient seen and examined in room.  No new complaints.   Objective Vitals:   06/08/20 1717 06/08/20 1816 06/08/20 1955 06/09/20 0558  BP: (!) 181/103 (!) 175/111 (!) 151/101 (!) 146/93  Pulse: 93 95 93 90  Resp: '16 16 16 18  '$ Temp: 98.4 F (36.9 C) (!) 97.4 F (36.3 C) 98.7 F (37.1 C) 98.2 F (36.8 C)  TempSrc: Oral Oral Oral Oral  SpO2: 100% 100% 99% 99%  Weight:      Height:       Physical Exam General:WDWN male in NAD Heart:RRR, no mrg Lungs:CTAB, nml WOB Abdomen:soft,NTND Extremities:no LE edema, b/l heels wrapped Dialysis Access: [facility]   Filed Weights   06/06/20 1256 06/06/20 1436 06/07/20 0411  Weight: (!) 158.8 kg (!) 158.8 kg (!) 160.1 kg    Intake/Output Summary (Last 24 hours) at 06/09/2020 0957 Last data filed at 06/09/2020 0811 Gross per 24 hour  Intake 720 ml  Output 1550 ml  Net -830 ml    Additional Objective Labs: Basic Metabolic Panel: Recent Labs  Lab 06/06/20 0159 06/06/20 1453 06/08/20 1330  NA 136 135 130*  K 3.7 4.4 5.2*  CL 98 97* 95*  CO2 '29 26 25  '$ GLUCOSE 143* 129* 100*  BUN 20 33* 55*  CREATININE 4.05* 4.91* 7.02*  CALCIUM 8.1* 8.6* 8.8*  PHOS  --  5.6* 7.0*   Liver Function Tests: Recent Labs  Lab 06/06/20 1453 06/08/20 1330  ALBUMIN 1.9* 2.0*   CBC: Recent Labs  Lab 06/03/20 0236 06/05/20 1839 06/06/20 0159 06/08/20 1330  WBC 10.4 9.0 8.1 8.7  HGB 9.5* 9.5* 8.8* 9.4*  HCT 30.4* 31.5* 29.5* 31.1*  MCV 86.1 87.5 88.3 86.6  PLT 269 245 240 257   Blood Culture    Component Value Date/Time   SDES BLOOD SITE NOT SPECIFIED 04/29/2020 1210   SPECREQUEST  04/29/2020 1210    BOTTLES DRAWN AEROBIC AND ANAEROBIC Blood Culture adequate volume   CULT  04/29/2020 1210    NO GROWTH 5 DAYS Performed at [facility], [address], [place], [place] [address]    REPTSTATUS 05/04/2020 FINAL 04/29/2020 1210   CBG: Recent Labs  Lab 06/08/20 0606  06/08/20 1128 06/08/20 1733 06/08/20 2104 06/09/20 0623  GLUCAP 85 112* 103* 127* 88    Medications: . [START ON 06/10/2020] ceFEPime (MAXIPIME) IV    . vancomycin 1,000 mg (06/08/20 1537)   . (feeding supplement) PROSource Plus  30 mL Oral TID BM  . amLODipine  2.5 mg Oral QHS  . Chlorhexidine Gluconate Cloth  6 each Topical Daily  . [START ON 06/15/2020] darbepoetin (ARANESP) injection - DIALYSIS  200 mcg Intravenous Q Tue-HD  . famotidine  20 mg Oral QHS  . feeding supplement (NEPRO CARB STEADY)  237 mL Oral BID BM  . heparin injection (subcutaneous)  7,500 Units Subcutaneous Q8H  . insulin aspart  0-9 Units Subcutaneous TID WC  . insulin glargine  11 Units Subcutaneous QHS  . loperamide  2 mg Oral Q8H  . metoCLOPramide  5 mg Oral TID AC  . multivitamin  1 tablet Oral QHS  . sorbitol  60 mL Oral Once    Dialysis Orders: NWTTS 4h 400/800 143.5kg 3K/2.5Ca TDCHeparin3000units IV TIW - Mircera158mgIVq2wks - last 12/8 - Venofer '100mg'$ IVqHD x10 - completed 6/10 doses  Problem/Plan: 1. Sacral decub/coccygealosteo: On Vanc/Cefepime x 6 weeks (thru 06/10/20) - ID following.Continues hydrotherapy.Now in CIR 2. Osteomyelitis L  foot/ pressure injury bilat heels: Wound care + abx. 3. ESRD: on HD TTS.HDoff schedule last week due to high patient volume/staffing issues.  HD yesterday per regular schedule. Truncated HD due to high patient volume/staffing issues.  4. Diarrhea -Resolved after stopping renvela.  5. BP/volume:BPimproved today with addition of low dose amlodipine at bedtime. Had issues with hypotension earlier in admission which improved once amlodipine and metoprolol d/c'd. Keep SBP >100 on HD.Has had problems withorthostatic hypotension not helping rehab,appears euvolemic on exam.+diarrhea,planrun even with HD let volume come up.Follow-up trend.   6. COVID PNA- tested positive on 11/30, is off isolation now.  7. Anemiaof CKD- last  Hgb9.4,cont Aranesp 226mgqSat 8. Secondary Hyperparathyroidism: CorrCa10.4, phos7.Not on VDRA.Stoppedrenvela 2/2diarrhea, phos now elevated again, will start Fosrenol.  9. Nutrition: last Alb 2. continue Nepro + pro-source supplements.On carb modified diet.With potassium, phosphorusstable/volume stable currently, follow labs. 10. DM T2 - per admit   LJen Mow PA-C [facility] 06/09/2020,9:57 AM  LOS: 3 days

## 2020-06-09 NOTE — Progress Notes (Signed)
[place] PHYSICAL MEDICINE & REHABILITATION PROGRESS NOTE   Subjective/Complaints:  Pt reports no BM yet- has been 4 days- he's not concerned- normally goes 1-2x/week at most- scared of having loose stools again/diarrhea.   No pain.  Missed therapy yesterday due to HD. 60 minutes.   K+ 5.2- however was during HD, so should be treated by Renal.     ROS:  Pt denies SOB, abd pain, CP, N/V/C/D, and vision changes  Objective:   No results found. Recent Labs    06/08/20 1330  WBC 8.7  HGB 9.4*  HCT 31.1*  PLT 257   Recent Labs    06/06/20 1453 06/08/20 1330  NA 135 130*  K 4.4 5.2*  CL 97* 95*  CO2 26 25  GLUCOSE 129* 100*  BUN 33* 55*  CREATININE 4.91* 7.02*  CALCIUM 8.6* 8.8*    Intake/Output Summary (Last 24 hours) at 06/09/2020 0757 Last data filed at 06/09/2020 0158 Gross per 24 hour  Intake 600 ml  Output 1550 ml  Net -950 ml     Pressure Injury 05/01/20 Buttocks Left Stage 4 - Full thickness tissue loss with exposed bone, tendon or muscle. packed with santyl gauze, dressed by WOCN, (Active)  05/01/20 0230  Location: Buttocks  Location Orientation: Left  Staging: Stage 4 - Full thickness tissue loss with exposed bone, tendon or muscle.  Wound Description (Comments): packed with santyl gauze, dressed by WOCN,  Present on Admission: Yes     Pressure Injury 05/01/20 Ankle Left;Lateral Unstageable - Full thickness tissue loss in which the base of the injury is covered by slough (yellow, tan, gray, green or brown) and/or eschar (tan, brown or black) in the wound bed. smal circular wound on the (Active)  05/01/20 0230  Location: Ankle  Location Orientation: Left;Lateral  Staging: Unstageable - Full thickness tissue loss in which the base of the injury is covered by slough (yellow, tan, gray, green or brown) and/or eschar (tan, brown or black) in the wound bed.  Wound Description (Comments): smal circular wound on the bone  Present on Admission: Yes      Pressure Injury 05/01/20 Foot Left;Posterior Unstageable - Full thickness tissue loss in which the base of the injury is covered by slough (yellow, tan, gray, green or brown) and/or eschar (tan, brown or black) in the wound bed. 4th and 5th toes appear n (Active)  05/01/20 0230  Location: Foot (4th and 5th toes and lateral/anterior side of the sole)  Location Orientation: Left;Posterior  Staging: Unstageable - Full thickness tissue loss in which the base of the injury is covered by slough (yellow, tan, gray, green or brown) and/or eschar (tan, brown or black) in the wound bed.  Wound Description (Comments): 4th and 5th toes appear necrotic circular wound on sole  Present on Admission: Yes     Pressure Injury 05/01/20 Foot Right;Medial;Posterior Unstageable - Full thickness tissue loss in which the base of the injury is covered by slough (yellow, tan, gray, green or brown) and/or eschar (tan, brown or black) in the wound bed. large circular wo (Active)  05/01/20 0230  Location: Foot  Location Orientation: Right;Medial;Posterior  Staging: Unstageable - Full thickness tissue loss in which the base of the injury is covered by slough (yellow, tan, gray, green or brown) and/or eschar (tan, brown or black) in the wound bed.  Wound Description (Comments): large circular wound on sole  and heel  Present on Admission: Yes     Pressure Injury 05/08/20 Heel Right  Unstageable - Full thickness tissue loss in which the base of the injury is covered by slough (yellow, tan, gray, green or brown) and/or eschar (tan, brown or black) in the wound bed. (Active)  05/08/20 1718  Location: Heel  Location Orientation: Right  Staging: Unstageable - Full thickness tissue loss in which the base of the injury is covered by slough (yellow, tan, gray, green or brown) and/or eschar (tan, brown or black) in the wound bed.  Wound Description (Comments):   Present on Admission: Yes    Physical Exam: Vital Signs Blood  pressure (!) 146/93, pulse 90, temperature 98.2 F (36.8 C), temperature source Oral, resp. rate 18, height '6\' 6"'$  (1.981 m), weight (!) 160.1 kg, SpO2 99 %.   Physical Exam Gen: awake, alert, appropriate, denies pain, NAD HEENT: oral mucosa pink and moist, NCAT Cardio: RRR Chest: CTA B/L- no W/R/R- good air movement Abd: Soft, NT, ND, (+)BS -hypoactive- doesn't appear distended, but BMI 40.79 Ext: no edema Psych: more awake Skin:    Comments: Sacral decubitus as well as bilateral heel dressings are in place change routinely per wound care nurse  Neurological: Ox2-3 this AM- but sleepy    Comments: Patient is alert in no acute distress. 5/5 strength throughout.     Assessment/Plan: 1. Functional deficits which require 3+ hours per day of interdisciplinary therapy in a comprehensive inpatient rehab setting.  Physiatrist is providing close team supervision and 24 hour management of active medical problems listed below.  Physiatrist and rehab team continue to assess barriers to discharge/monitor patient progress toward functional and medical goals  Care Tool:  Bathing    Body parts bathed by patient: Right arm,Left arm,Chest,Abdomen,Front perineal area,Buttocks,Right upper leg,Left upper leg,Face   Body parts bathed by helper: Right lower leg,Left lower leg,Buttocks     Bathing assist Assist Level: Moderate Assistance - Patient 50 - 74%     Upper Body Dressing/Undressing Upper body dressing   What is the patient wearing?: Pull over shirt    Upper body assist Assist Level: Moderate Assistance - Patient 50 - 74%    Lower Body Dressing/Undressing Lower body dressing      What is the patient wearing?: Pants,Incontinence brief     Lower body assist Assist for lower body dressing: Maximal Assistance - Patient 25 - 49%     Toileting Toileting    Toileting assist Assist for toileting: Maximal Assistance - Patient 25 - 49% Assistive Device Comment:  (urinal)    Transfers Chair/bed transfer  Transfers assist  Chair/bed transfer activity did not occur: Safety/medical concerns (Per nursing report)  Chair/bed transfer assist level: Minimal Assistance - Patient > 75% (slideboard)     Locomotion Ambulation   Ambulation assist   Ambulation activity did not occur: Safety/medical concerns (fatigue, lightheadedness, generalized weakness)          Walk 10 feet activity   Assist  Walk 10 feet activity did not occur: Safety/medical concerns (fatigue, lightheadedness, generalized weakness)        Walk 50 feet activity   Assist Walk 50 feet with 2 turns activity did not occur: Safety/medical concerns (fatigue, lightheadedness, generalized weakness)         Walk 150 feet activity   Assist Walk 150 feet activity did not occur: Safety/medical concerns (fatigue, lightheadedness, generalized weakness)         Walk 10 feet on uneven surface  activity   Assist Walk 10 feet on uneven surfaces activity did not occur: Safety/medical concerns (fatigue, lightheadedness,  generalized weakness)         Wheelchair     Assist Will patient use wheelchair at discharge?: Yes Type of Wheelchair: Manual    Wheelchair assist level: Supervision/Verbal cueing Max wheelchair distance: [id number]    Wheelchair 50 feet with 2 turns activity    Assist        Assist Level: Supervision/Verbal cueing   Wheelchair 150 feet activity     Assist      Assist Level: Minimal Assistance - Patient > 75%   Blood pressure (!) 146/93, pulse 90, temperature 98.2 F (36.8 C), temperature source Oral, resp. rate 18, height '6\' 6"'$  (1.981 m), weight (!) 160.1 kg, SpO2 99 %.  Medical Problem List and Plan: 1.  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              -ELOS/Goals: MinA 2-3 weeks. 2.   Antithrombotics: -DVT/anticoagulation: Subcutaneous heparin.  Check vascular study             -antiplatelet therapy: N/A 3. Pain Management: Tylenol as needed 4. Mood: Provide emotional support             -antipsychotic agents: N/A 5. Neuropsych: This patient is? capable of making decisions on his own behalf.  1/17- pt appeared slightly confused but amiable this AM- will monitor 6. Skin/Wound Care: Routine skin checks 7. Fluids/Electrolytes/Nutrition: Routine in and outs with follow-up chemistries 8.  End-stage renal disease.  Hemodialysis per renal services 9.  Sacral decubitus ulcer as well as ischemic ulcers on the left foot fourth toe and fifth metatarsal head and right heel and fifth metatarsal head.  Wound care nurse follow-up.  Patient to see Dr. DSharol Givenoutpatient.  Continue bilateral PRAFO's. 10.  ID/ coccygeal osteomyelitis.  Continue vancomycin and cefepime through 06/10/2020  1/17- WBC 8.1  1/18- will schedule turning q2 hours to get off wounds- also will make sure on specialty bed 10.  Anemia of chronic disease.  Continue Aranesp. Hgb 8.8 on 1/16, repeat with HD  1/17- no CBC today, only BMP- will try and check with HD on Tuesday.   1/19- doing better base don labs yesterday 11.  Diabetes mellitus.  Hemoglobin A1c 5.6.  Lantus insulin 14 units nightly             1/16: CBGs 108-167: continue current regimen.   1/17- BGs 102-160- con't regimen  1/18- per pharmacy, want to decrease his insulin due to fasting BG of 85- will decrease to 11 units, and wasn't on insulin at home. Based on ESRD, will NEED insulin at home.   1/19- will let nursing know to teach pt- placed Diabetes coordinator consult yesterday- waiting for them to see pt.  12.  Morbid obesity.  BMI 38.37.  Dietary follow-up  13. Orthostatic hypotension  1/17- Sx's so significant, vomited this AM when got up- will try abd binder and ACE wraps for LEs- since due to osteomyelitis, don't think can use TEDs- if need be, can add  Midodrine, but due to ESRD, cannot start Florinef.  1/18- BP laying is 164/101 this AM- might not do well with midodrine?  1/19- since not clear if actually orthostatic, per renal, will order daily orthostatics x7 days.  14. Diarrhea with new constipation  1/18- LBM 3 days ago- on imodium 2 mg q4 hours around the clock- will change to q8 hours to  see if can get him to have BM.  1/19- wil give Sorbitol 60 ml today since no BM- maintain lower dose of imodium per pt request.     LOS: 3 days A FACE TO FACE EVALUATION WAS PERFORMED  Alexias Margerum 06/09/2020, 7:57 AM

## 2020-06-09 NOTE — Progress Notes (Signed)
Physical Therapy Session Note  [name] MRN: [id number] Date of Birth: Feb 12, 1971  Today's Date: 06/09/2020 PT Individual Time: 1500-1600 PT Individual Time Calculation (min): 60 min   Short Term Goals: Week 1:  PT Short Term Goal 1 (Week 1): pt will perform all bed mobility with CGA PT Short Term Goal 2 (Week 1): Pt will transfer sit<>stand with LRAD and mod A PT Short Term Goal 3 (Week 1): Pt will transfer bed<>chair with LRAD and min A  Skilled Therapeutic Interventions/Progress Updates:    Patient in supine and reports feeling some better than earlier.  Reports BP has been high, but agrees to application of ace wraps to lower legs (already wearing abdominal binder.)  Supine to sit with rail and min A increased time.  Cues for scooting.  Discussed using regular slide board despite MD wanting him to use [facility] board as PT who worked with him earlier reported was difficult.  Patient in agreement reporting the [facility] board pinched him.  Discussed staying forward to avoid pressure on sacral wound.  Patient performed SBT to w/c from inflated air bed with mod A for board placement and scooting hips.  Patient in w/c able to propel about 76' with S till fatigued.  Patient in gym seated BP 168/109, HR 104.  Performed sit to stand in parallel bars with max A.  Able to stand about 10 seconds with lateral weight shifts prior to needing to sit with max A due to symptoms.  Elevated feet and pt leaned back couple minutes then BP130/110, HR 106.  Performed sit to stand second time with marching in place about 10 seconds then seated and BP 102/78, HR 111 and pt symptomatic.  Assisted in w/c to dayroom.  Patient performed seated LE on Kinetron at 45 cm/sec 2 x 2 minute bouts with UE therex during rest with 5# bar bicep curls x 15, shoulder flexion to shoulder level x 10 and scapular squeezes no resist x 10.  Patient assisted in w/c to room.  Slide board with mod A back to bed.  Patient  needing to return to supine and there developed nausea and vomiting x 1.  Pt able to pull up to [facility] with head board.  Doffed binder and ace wraps and left pt with needs in reach, NT in the room.   Therapy Documentation Precautions:  Precautions Precautions: Fall Precaution Comments: wounds on bilat feet and L sacrum, monitor orthostatic BPs Required Braces or Orthoses: Other Brace Other Brace: abdominal binder Restrictions Weight Bearing Restrictions: No Pain: Pain Assessment Pain Score: 0-No pain   Therapy/Group: Individual Therapy  Reginia Naas  Magda Kiel, PT 06/09/2020, 5:38 PM

## 2020-06-09 NOTE — Patient Care Conference (Signed)
Inpatient RehabilitationTeam Conference and Plan of Care Update Date: 06/08/2020   Time: 11:51 AM    Patient Name: Fred [name]      Medical Record Number: [id number]  Date of Birth: Mar 02, 1971 Sex: Male         Room/Bed: 4M05C/4M05C-01 Payor Info: Payor: MEDICARE / Plan: MEDICARE PART A AND B / Product Type: *No Product type* /    Admit Date/Time:  06/06/2020  1:05 PM  Primary Diagnosis:  Critical illness myopathy  Hospital Problems: Principal Problem:   Critical illness myopathy    Expected Discharge Date: Expected Discharge Date: 06/28/20  Team Members Present: Physician leading conference: Dr. Courtney Heys Care Coodinator Present: Dorthula Nettles, RN, BSN, CRRN;Becky Dupree, LCSW Nurse Present: Rayne Du, LPN PT Present: Stacy Gardner, PT OT Present: Lillia Corporal, OT PPS Coordinator present : Ileana Ladd, Burna Mortimer, SLP     Current Status/Progress Goal Weekly Team Focus  Bowel/Bladder   pt cont of b and b he is oliguric, having some diharrea, lbm- 06/04/20  remain cont and reduce diharrea  pt taking immodium for dihareea, assess q shift and prn   Swallow/Nutrition/ Hydration             ADL's   LB adl mod A bed level, UB adl set up/CS w/c level, bed mobility min A, SB transfer min A, proximal weakness, general deconditioning  CS/Mod I w/c level  adl training, increase tolerance for OOB, sitting balance, UB conditioning, transfer training   Mobility   rolling supervision, supine<>sit min A, sit<>supine supervision, SB transfers min A, sit<>stand max A in // bars.  Mod I WC level, CGA for dynamic standing/gait  functional mobility/transfers, generalized strengthening, dynamic standing balance/coordination, orthostatics management, improved endurance with activity.   Communication             Safety/Cognition/ Behavioral Observations            Pain   pt has no current c/o pain  remain free of pain  Assess q shift and prn   Skin   pt has stage 4 to  saccrum,  pressure ulcer to both heels, left pinky toe, wound on lateral ball of foot  decrease the amount of infection on skin  wet packing to dry with foam, feet have betadine and abd pads with kirlex     Discharge Planning:      Team Discussion: Patient needs a specialty bed for pressure relief. BP is running high, possible ortho static. Has BP issues with therapy. Continent B/B. Has been in 2 SNF's but plans to go home with dad. Not sure of W/C accessibility.  Patient on target to meet rehab goals: Mod assist for lower body ADL's at bed level. Upper body ADL's requires set up and contact supervision. Goal is contact supervision/Mod I at W/C level. He is min assist with slide board transfers, suggest Orrum board due to wounds at the sacrum. Goals are mod I at W/C level, contact guard assist for dynamic standing and gait.  *See Care Plan and progress notes for long and short-term goals.   Revisions to Treatment Plan:  MD to continue to monitor BP for orthostatic hypotension.  Teaching Needs: Family education, wound/skin care, gait training, weight bearing precautions, transfer training.  Current Barriers to Discharge: Inaccessible home environment, Decreased caregiver support, Home enviroment access/layout, IV antibiotics, Wound care, Lack of/limited family support, Weight bearing restrictions and Behavior  Possible Resolutions to Barriers: Continue current medications, provide medication management education, provide emotional support to  patient and family.     Medical Summary Current Status: no BM in 3 days- was having diarrhea; BP issues- high laying down; and LOW sitting up  Barriers to Discharge: Decreased family/caregiver support;Home enviroment access/layout;Hemodialysis;Medical stability;IV antibiotics;Weight bearing restrictions;Weight;Wound care  Barriers to Discharge Comments: goal d/c with father- LB ADLs mod A; UB doing OK; transfer boards- suggest beazy board- goals w/c  level Supervision/mod I Possible Resolutions to [employer] Focus: max A sit-stand parallel bars- clinically orthostatic hypotension? needs to be able to do own pressure relief; important to do q15 minutes- 2/7 as d/c date   Continued Need for Acute Rehabilitation Level of Care: The patient requires daily medical management by a physician with specialized training in physical medicine and rehabilitation for the following reasons: Direction of a multidisciplinary physical rehabilitation program to maximize functional independence : Yes Medical management of patient stability for increased activity during participation in an intensive rehabilitation regime.: Yes Analysis of laboratory values and/or radiology reports with any subsequent need for medication adjustment and/or medical intervention. : Yes   I attest that I was present, lead the team conference, and concur with the assessment and plan of the team.   Cristi Loron 06/09/2020, 2:08 PM

## 2020-06-09 NOTE — IPOC Note (Signed)
Overall Plan of Care [facility]) Patient Details Name: Fred [name] MRN: [id number] DOB: [birth year]  Admitting Diagnosis: Critical illness myopathy  Hospital Problems: Principal Problem:   Critical illness myopathy     Functional Problem List: Nursing Bladder,Bowel,Endurance,Medication Management,Motor,Pain,Safety,Skin Integrity,Sensory  PT Balance,Edema,Endurance,Motor,Sensory,Skin Integrity  OT Balance,Sensory,Skin Integrity,Endurance,Motor,Pain  SLP    TR         Basic ADL's: OT Grooming,Bathing,Dressing,Toileting     Advanced  ADL's: OT Simple Meal Preparation,Light Housekeeping     Transfers: PT Bed Mobility,Bed to [employer]  OT Toilet     Locomotion: PT Ambulation,Wheelchair Mobility,Stairs     Additional Impairments: OT    SLP        TR      Anticipated Outcomes Item Anticipated Outcome  Self Feeding independent  Swallowing      Basic self-care  mod I  Toileting  supervision   Bathroom Transfers supervision  Bowel/Bladder  To be continent of bowel/bladder with mod I  Transfers  Mod I  Locomotion  CGA with LRAD  Communication     Cognition     Pain  <2  Safety/Judgment  able to call for help and express needs   Therapy Plan: PT Intensity: Minimum of 1-2 x/day ,45 to 90 minutes PT Frequency: 5 out of 7 days PT Duration Estimated Length of Stay: 3 weeks OT Intensity: Minimum of 1-2 x/day, 45 to 90 minutes OT Frequency: 5 out of 7 days OT Duration/Estimated Length of Stay: 3 weeks     Due to the current state of emergency, patients may not be receiving their 3-hours of Medicare-mandated therapy.   Team Interventions: Nursing Interventions Patient/Family Education,Pain Management,Bladder Management,Medication Management,Bowel Management,Skin Care/Wound Management,Disease Management/Prevention,Cognitive Remediation/Compensation,Discharge Planning  PT interventions Ambulation/gait training,Discharge planning,Functional mobility  training,Psychosocial support,Therapeutic Activities,Balance/vestibular training,Disease management/prevention,Neuromuscular re-education,Skin care/wound [employer] manager propulsion/positioning,Cognitive remediation/compensation,DME/adaptive equipment instruction,Pain management,Splinting/orthotics,UE/LE Strength taining/ROM,Community reintegration,Functional electrical stimulation,Patient/family education,Stair training,UE/LE Coordination activities  OT Interventions Balance/vestibular training,Self Care/advanced ADL retraining,Therapeutic Exercise,Wheelchair propulsion/positioning,DME/adaptive equipment instruction,Pain management,Skin care/wound managment,UE/LE Strength taining/ROM,Community reintegration,Patient/family education,UE/LE Coordination activities,Discharge planning,Functional mobility training,Therapeutic Activities  SLP Interventions    TR Interventions    SW/CM Interventions Discharge Planning,Psychosocial Support,Patient/Family Education   Barriers to Discharge MD  Medical stability, Home enviroment access/loayout, IV antibiotics, Wound care, Lack of/limited family support, Weight, Weight bearing restrictions and ESRD  Nursing      PT Decreased caregiver support,Home environment access/layout,Wound Care,Lack of/limited family support,Hemodialysis lives with 81 yr/old father, would need to be independent, multiple wounds  OT Wound Care    SLP      SW Decreased caregiver support,Wound Care,Weight Pt does not have care at discharge except supervision, he is a big man and the wheelchair would be large and heavy. He has multiple wounds   Team Discharge Planning: Destination: PT-Home ,OT- Home , SLP-  Projected Follow-up: PT-Home health PT, OT-  Home health OT, SLP-  Projected Equipment Needs: PT-To be determined, OT- To be determined, SLP-  Equipment Details: PT- , OT-  Patient/family involved in discharge planning: PT- Patient,  OT-Patient, SLP-   MD  ELOS: ~ 3 weeks Medical Rehab Prognosis:  Fair Assessment: Pt is a 50 yr old male with ICU myopathy due to COVID pneumonia and multiple osteomyelitis sites- on IV ABX., also has ESRD, c/o dizziness/lightheadedness- looking for signs of orthostasis; diarrhea with new constipation- LBM 4 days ago-   Goals- CGA- to supervision.    See Team Conference Notes for weekly updates to the plan of care

## 2020-06-09 NOTE — Progress Notes (Addendum)
Inpatient Diabetes Program Recommendations  AACE/ADA: New Consensus Statement on Inpatient Glycemic Control (2015)  Target Ranges:  Prepandial:   less than 140 mg/dL      Peak postprandial:   less than 180 mg/dL (1-2 hours)      Critically ill patients:  140 - 180 mg/dL   Results for ARMON, ORVIS (MRN [id number]) as of 06/09/2020 07:55  Ref. Range 06/08/2020 06:06 06/08/2020 11:28 06/08/2020 17:33 06/08/2020 21:04  Glucose-Capillary Latest Ref Range: 70 - 99 mg/dL 85 112 (H) 103 (H) 127 (H)  11 units LANTUS   Results for SHASHANK, KWASNIK (MRN [id number]) as of 06/09/2020 07:55  Ref. Range 06/09/2020 06:23  Glucose-Capillary Latest Ref Range: 70 - 99 mg/dL 88   Results for NEHAN, FLAUM (MRN [id number]) as of 06/08/2020 12:19  Ref. Range 05/04/2020 05:46  Hemoglobin A1C Latest Ref Range: 4.8 - 5.6 % 5.6    Admit 12/09 with COVID/ Sacral Decubitus  History: DM, ESRD  SNF DM Meds: Novolog 0-9 units TID                            Lantus 14 units QHS  Current Orders: Lantus 11 units QHS                            Novolog Sensitive Correction Scale/ SSI (0-9 units) TID AC     Transferred to Rehab 01/16  MD- Note that Lantus reduced to 11 units QHS last PM--Agree since CBG was only 85 yesterday AM after getting 14 units Lantus the night prior.  Requiring very little Novolog SSI.  Perhaps we could try stopping the Lantus completely but continue the Novolog SSI for now as a back up.  His CBG was only 88 this AM. Given his A1c was only 5.6% on admission in December, maybe he could try oral diabetes med?  Could try adding Tradjenta 5 mg Daily (DPP-4 inhibitor medication).  Tradjenta does not have to be renally dosed and has lower risk of Hypoglycemia for pts with ESRD (versus sulfonylurea).  If you decide not to stop the Lantus, would reduce to 8 units QHS for tonight   Addendum 10:50am--Met w/ pt at bedside today (his sister [name] was on the phone listening and assisting with  answering my questions as well).  Pt has History of diabetes.  Was taking Metformin and another oral medicine (pt and sister could not remember the name of the med) prior to 1st hospitalization at another facility in October 2021.  Pt told me he went to a SNF for rehab and got sick again and needed to go back to the hospital.  After the last hospital stay, he was d/c'd to [facility].  Pt contracted COVID and was admitted to [facility] hospital 04/29/2020.  Transferred to Rehab 06/06/2020.  Pt told me he has CBG meter at home and knows how to check.  We reviewed CBG goals for home.  Discussed with pt and sister that b/c pt has ESRD many of the oral diabetes meds are not safe to use with pts with renal disease.  Discussed with pt and sister that there is 1 med we could try that is safe for ESRD (Tradjenta) but did explain to pt and sister that MD in the hospital will make the final decision if pt to go home on oral med or insulin.  Discussed with pt and sister that as of right now,  the MD is leaning towards insulin for home.  Pt and sister OK with this.  Discussed with them what Lantus insulin is and how it works.  Also reviewed signs and symptoms of Hypoglycemia and how to treat HYPO at home.  We also reviewed how to use insulin pens for home.  Sister was able to listen on the phone while I demo'ed to pt and I also emailed sister an insulin pen instructional video as well. Educated pt on how to use insulin pen at home.  Reviewed all steps of insulin pen including attachment of needle, 2-unit air shot, dialing up dose, giving injection, rotation of injection sites, removing needle, disposal of sharps, storage of unused insulin, disposal of insulin etc.  Patient able to provide successful return demonstration.  Reviewed troubleshooting with insulin pen.   Have asked RNs caring for patient to please allow patient to give all injections here in hospital as much as possible for practice.  MD to give patient Rxs for  insulin pens and insulin pen needles.       --Will follow patient during hospitalization--  Wyn Quaker RN, MSN, CDE Diabetes Coordinator Inpatient Glycemic Control Team Team Pager: [phone] (8a-5p)

## 2020-06-09 NOTE — Progress Notes (Signed)
Physical Therapy Session Note  [name] MRN: [id number] Date of Birth: [birth year]  Today's Date: 06/09/2020 PT Individual Time: [id number] PT Individual Time Calculation (min): 68 min   Short Term Goals: Week 1:  PT Short Term Goal 1 (Week 1): pt will perform all bed mobility with CGA PT Short Term Goal 2 (Week 1): Pt will transfer sit<>stand with LRAD and mod A PT Short Term Goal 3 (Week 1): Pt will transfer bed<>chair with LRAD and min A  Skilled Therapeutic Interventions/Progress Updates:   Received pt reclined in recliner, pt agreeable to therapy, and denied any pain during session. Session with emphasis on functional mobility/transfers, generalized strengthening, dynamic standing balance/coordination, and improved activity tolerance. BP reclined in recliner with abdominal binder on: 119/89. Pt reported feeling numb around occiput and mild lightheadedness. When therapist brought recliner back to sitting position, pt reported increased symptoms and required rest break prior to transferring back to bed. Per MD, pt to use [facility] board instead of slideboard for transfers due to sacral wounds. Attempted to transfer recliner<>bed<>WC as pt currently sitting on WC cushion. Pt transferred recliner<>bed using Beezy board with mod/max A with increased difficulty as Beezy board not sliding and causing pt to tip posteriorly. Pt reported liking the slideboard better and felt like it was easier to use. Pt transferred sit<>supine with supervision. BP in supine with abdominal binder: 146/97; and pt reported same symptoms as when sitting up. Re-inflated air mattress, as pt reported transfers are easier with it inflated and pt transferred supine<>sitting EOB with supervision and attempted transfer back to [facility], however pt reported feeling too weak and air mattress not being inflated enough causing him to sit in a hole. Returned to supine for ~4 minute rest break. Pt then transferred  supine<>sitting EOB with CGA and use of bedrails and required 3 attempts and max A +2 to stand in [place]. Attempted to assess BP in standing, however pt unable to remain standing long enough and returned to sitting. BP: 96/71 upon sitting and pt with continued c/o dizziness. Pt reported 8/10 fatigue after standing and therapist suggested lying back down as pt looked ill and reporting discomfort along occiput. Pt performed the following exercises supine in bed with supervision and verbal cues for technique:  -marching x10 bilaterally  -hip abduction x10 bilaterally  -hip adduction towel squeezes 2x10 -ankle circles x15 clockwise/counterclockwise bilaterally  -SLR x10 bilaterally -bridges x10 -hip abd/add in hooklying x10 -sidelying clamshells 2x10 bilaterally  -reverse clamshells 2x10 bilaterally  Educated pt on pressure relief strategies every 15 minutes in supine and when sitting in WC/recliner (including rolling, lateral/anterior leans, and boosting up using UEs); pt verbalized understanding. Concluded session with pt supine in bed with all needs within reach.   Therapy Documentation Precautions:  Precautions Precautions: Fall Precaution Comments: wounds on bilat feet and L sacrum, monitor orthostatic BPs Required Braces or Orthoses: Other Brace Other Brace: abdominal binder Restrictions Weight Bearing Restrictions: No  Therapy/Group: Individual Therapy Alfonse Alpers PT, DPT   06/09/2020, 7:13 AM

## 2020-06-09 NOTE — Progress Notes (Signed)
Patient given sorbitol, per order, and threw up times two. Patient states emesis was from rolling in the bed too quickly. PRN med offered, patient stated "this is just what happens to me sometimes".

## 2020-06-10 ENCOUNTER — Inpatient Hospital Stay (HOSPITAL_COMMUNITY): Payer: Medicare Other | Admitting: Occupational Therapy

## 2020-06-10 ENCOUNTER — Inpatient Hospital Stay (HOSPITAL_COMMUNITY): Payer: Medicare Other

## 2020-06-10 ENCOUNTER — Inpatient Hospital Stay (HOSPITAL_COMMUNITY): Payer: Medicare Other | Admitting: Physical Therapy

## 2020-06-10 LAB — CBC
HCT: 31.7 % — ABNORMAL LOW (ref 39.0–52.0)
Hemoglobin: 9.6 g/dL — ABNORMAL LOW (ref 13.0–17.0)
MCH: 26.1 pg (ref 26.0–34.0)
MCHC: 30.3 g/dL (ref 30.0–36.0)
MCV: 86.1 fL (ref 80.0–100.0)
Platelets: 276 10*3/uL (ref 150–400)
RBC: 3.68 MIL/uL — ABNORMAL LOW (ref 4.22–5.81)
RDW: 16.7 % — ABNORMAL HIGH (ref 11.5–15.5)
WBC: 9.6 10*3/uL (ref 4.0–10.5)
nRBC: 0 % (ref 0.0–0.2)

## 2020-06-10 LAB — RENAL FUNCTION PANEL
Albumin: 2 g/dL — ABNORMAL LOW (ref 3.5–5.0)
Anion gap: 12 (ref 5–15)
BUN: 40 mg/dL — ABNORMAL HIGH (ref 6–20)
CO2: 27 mmol/L (ref 22–32)
Calcium: 8.8 mg/dL — ABNORMAL LOW (ref 8.9–10.3)
Chloride: 96 mmol/L — ABNORMAL LOW (ref 98–111)
Creatinine, Ser: 6.8 mg/dL — ABNORMAL HIGH (ref 0.61–1.24)
GFR, Estimated: 9 mL/min — ABNORMAL LOW (ref >60)
Glucose, Bld: 112 mg/dL — ABNORMAL HIGH (ref 70–99)
Phosphorus: 6.7 mg/dL — ABNORMAL HIGH (ref 2.5–4.6)
Potassium: 4.6 mmol/L (ref 3.5–5.1)
Sodium: 135 mmol/L (ref 135–145)

## 2020-06-10 LAB — GLUCOSE, CAPILLARY
Glucose-Capillary: 141 mg/dL — ABNORMAL HIGH (ref 70–99)
Glucose-Capillary: 119 mg/dL — ABNORMAL HIGH (ref 70–99)
Glucose-Capillary: 97 mg/dL (ref 70–99)
Glucose-Capillary: 119 mg/dL — ABNORMAL HIGH (ref 70–99)

## 2020-06-10 MED ORDER — AMLODIPINE BESYLATE 5 MG PO TABS
5.0000 mg | ORAL_TABLET | Freq: Every day | ORAL | Status: DC
Start: 2020-06-10 — End: 2020-06-18
  Administered 2020-06-10 – 2020-06-17 (×8): 5 mg via ORAL
  Filled 2020-06-10 (×8): qty 1

## 2020-06-10 MED ORDER — LOPERAMIDE HCL 2 MG PO CAPS
2.0000 mg | ORAL_CAPSULE | Freq: Three times a day (TID) | ORAL | Status: DC | PRN
  Administered 2020-07-04 – 2020-07-05 (×3): 2 mg via ORAL
  Filled 2020-06-10 (×3): qty 1

## 2020-06-10 MED ORDER — BISACODYL 10 MG RE SUPP
10.0000 mg | Freq: Every day | RECTAL | Status: DC | PRN
  Administered 2020-06-10 – 2020-06-13 (×2): 10 mg via RECTAL
  Filled 2020-06-10 (×2): qty 1

## 2020-06-10 MED ORDER — METOPROLOL TARTRATE 12.5 MG HALF TABLET
12.5000 mg | ORAL_TABLET | Freq: Once | ORAL | Status: AC
  Administered 2020-06-10: 12.5 mg via ORAL
  Filled 2020-06-10: qty 1

## 2020-06-10 MED ORDER — PROCHLORPERAZINE MALEATE 5 MG PO TABS
10.0000 mg | ORAL_TABLET | Freq: Four times a day (QID) | ORAL | Status: DC | PRN
  Administered 2020-06-10 – 2020-07-14 (×5): 10 mg via ORAL
  Filled 2020-06-10 (×5): qty 2

## 2020-06-10 MED ORDER — VANCOMYCIN HCL IN DEXTROSE 1-5 GM/200ML-% IV SOLN
INTRAVENOUS | Status: AC
  Filled 2020-06-10: qty 200

## 2020-06-10 NOTE — Progress Notes (Addendum)
[facility] Progress Note   Subjective:   Patient seen and examined at bedside.  Reports n/v this AM since taking medication for constipation.  Denies fever, +chills.  Also feeling dizzy, like the room is spinning.  Denies CP, SOB and edema.   Objective Vitals:   06/08/20 1955 06/09/20 0558 06/09/20 1326 06/09/20 1925  BP: (!) 151/101 (!) 146/93 (!) 196/112 (!) 177/108  Pulse: 93 90 92 83  Resp: '16 18 18 20  '$ Temp: 98.7 F (37.1 C) 98.2 F (36.8 C) 99.3 F (37.4 C) 100.2 F (37.9 C)  TempSrc: Oral Oral Oral Oral  SpO2: 99% 99% 100% 99%  Weight:      Height:       Physical Exam General:WDWN male in NAD, febrile Heart:RRR, no mrg Lungs:CTAB, nml WOB Abdomen:soft, NTND Extremities:no LE edema, b/l heels wrapped Dialysis Access: [facility]   Filed Weights   06/07/20 0411  Weight: (!) 160.1 kg    Intake/Output Summary (Last 24 hours) at 06/10/2020 0951 Last data filed at 06/10/2020 0836 Gross per 24 hour  Intake 120 ml  Output 100 ml  Net 20 ml    Additional Objective Labs: Basic Metabolic Panel: Recent Labs  Lab 06/06/20 0159 06/06/20 1453 06/08/20 1330  NA 136 135 130*  K 3.7 4.4 5.2*  CL 98 97* 95*  CO2 '29 26 25  '$ GLUCOSE 143* 129* 100*  BUN 20 33* 55*  CREATININE 4.05* 4.91* 7.02*  CALCIUM 8.1* 8.6* 8.8*  PHOS  --  5.6* 7.0*   Liver Function Tests: Recent Labs  Lab 06/06/20 1453 06/08/20 1330  ALBUMIN 1.9* 2.0*   CBC: Recent Labs  Lab 06/05/20 1839 06/06/20 0159 06/08/20 1330  WBC 9.0 8.1 8.7  HGB 9.5* 8.8* 9.4*  HCT 31.5* 29.5* 31.1*  MCV 87.5 88.3 86.6  PLT 245 240 257    CBG: Recent Labs  Lab 06/09/20 0623 06/09/20 1143 06/09/20 1608 06/09/20 2108 06/10/20 0559  GLUCAP 88 116* 111* 127* 141*   Studies/Results: DG Abd 1 View  Result Date: 06/10/2020 CLINICAL DATA:  Abdominal pain with nausea vomiting. EXAM: ABDOMEN - 1 VIEW COMPARISON:  No comparison studies available. FINDINGS: Diffuse gaseous distention of small  bowel and colon noted without overtly obstructive pattern. Air in stool is seen in the sigmoid colon and rectum. Visualized bony anatomy unremarkable. IMPRESSION: Diffuse gaseous distention of small bowel and colon. Ileus would be a consideration. Pattern is not frankly obstructive. Electronically Signed   By: Misty Stanley M.D.   On: 06/10/2020 08:51    Medications: . ceFEPime (MAXIPIME) IV    . vancomycin 1,000 mg (06/08/20 1537)   . (feeding supplement) PROSource Plus  30 mL Oral TID BM  . amLODipine  2.5 mg Oral QHS  . Chlorhexidine Gluconate Cloth  6 each Topical Daily  . [START ON 06/15/2020] darbepoetin (ARANESP) injection - DIALYSIS  200 mcg Intravenous Q Tue-HD  . famotidine  20 mg Oral QHS  . feeding supplement (NEPRO CARB STEADY)  237 mL Oral BID BM  . heparin injection (subcutaneous)  7,500 Units Subcutaneous Q8H  . insulin aspart  0-9 Units Subcutaneous TID WC  . insulin glargine  11 Units Subcutaneous QHS  . lanthanum  500 mg Oral TID WC  . loperamide  2 mg Oral Q8H  . metoCLOPramide  5 mg Oral TID AC  . multivitamin  1 tablet Oral QHS    Dialysis Orders: NWTTS 4h 400/800 143.5kg 3K/2.5Ca TDCHeparin3000units IV TIW - Mircera176mgIVq2wks - last 12/8 -  Venofer '100mg'$ IVqHD x10 - completed 6/10 doses  Problem/Plan: 1. Sacral decub/coccygealosteo: On Vanc/Cefepime x 6 weeks (thru 06/10/20) - ID following.Continues hydrotherapy.Now in CIR 2. 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 3. ESRD: on HD TTS.Back on schedule this week. Truncated HD due to high patient volume/staffing issues. Plan for HD today per regular schedule.  4. Diarrhea -Resolved after stopping renvela.  5. BP/volume:BPstarting to rise up some systolic and diastolic. Previously having issues with hypotension which improved once amlodipine and metoprolol d/c'd. Keep SBP >100 on HD.Has had problems withorthostatic hypotension.  BP elevated today, increase  amlodipine to '5mg'$  qd.  Appears euvolemic on exam.+diarrhea,planrun even with HD let volume come up.Follow-up trend.   6. COVID PNA- tested positive on 11/30, is off isolation now.  7. Anemiaof CKD- last Hgb9.4 ,cont Aranesp 23mgqSat 8. Secondary Hyperparathyroidism: CorrCa10.4, phos7.Not on VDRA.Stoppedrenvela 2/2diarrhea, phos now elevated again, will start Fosrenol.  9. Nutrition: last Alb1.9on 1/16, continue Nepro + pro-source supplements.On carb modified diet.With potassium, phosphorusstable/volume stable currently, follow labs. 10. DMT2 - per admit 11. N/v - following medication. Per primary 12. Febrile, n/v - abd xray shows possible ileus, per admit   LJen Mow PA-C CMonessen1/20/2022,9:51 AM  LOS: 4 days

## 2020-06-10 NOTE — Progress Notes (Signed)
Physical Therapy Session Note  [name] MRN: [id number] Date of Birth: 08-09-1970  Today's Date: 06/10/2020 PT Missed Time: 75 Minutes Missed Time Reason: Patient ill (Comment) (N/V)  Short Term Goals: Week 1:  PT Short Term Goal 1 (Week 1): pt will perform all bed mobility with CGA PT Short Term Goal 2 (Week 1): Pt will transfer sit<>stand with LRAD and mod A PT Short Term Goal 3 (Week 1): Pt will transfer bed<>chair with LRAD and min A  Skilled Therapeutic Interventions/Progress Updates:    Patient supine in bed, throwing up. Requesting to not participate in therapy at this time. Per RN, small obstruction likely contributing to emesis. Patient remaining in bed, bed alarm on, call light within reach, emesis bag in hand.   Therapy Documentation Precautions:  Precautions Precautions: Fall Precaution Comments: wounds on bilat feet and L sacrum, monitor orthostatic BPs Required Braces or Orthoses: Other Brace Other Brace: abdominal binder Restrictions Weight Bearing Restrictions: No    Therapy/Group: Individual Therapy  Karoline Caldwell, PT, DPT, CBIS  06/10/2020, 7:34 AM

## 2020-06-10 NOTE — Progress Notes (Signed)
Occupational Therapy Session Note  Patient Details  Name: Codee Tomkiewicz MRN: [id number] Date of Birth: 21-May-1971  Today's Date: 06/10/2020 OT Missed Time: 51 Minutes Missed Time Reason: Patient ill (comment) (intense nausea)   Short Term Goals: Week 1:  OT Short Term Goal 1 (Week 1): patient will completed bed mobility with CS OT Short Term Goal 2 (Week 1): patient will complete sit pivot transfers with min A OT Short Term Goal 3 (Week 1): patient will complete lower body bathing and dressing with min A and use of assistive devices as needed OT Short Term Goal 4 (Week 1): patient will tolerate sitting out of bed for 3-4 hours   Skilled Therapeutic Interventions/Progress Updates:    Pt greeted at time of session supine in bed resting with nausea bag by his face, sleeping but woken with cutting lights on and verbal stimuli. Requested more nausea bags which were provided. Note pt only opened eyes once briefly during conversation. Politely declined/refused OT session this morning saying "I would if I could but I just cant right now." Encouraged to participate in OT/PT sessions later in day. Missed 45 minutes of OT d/t nausea/feeling ill.   Therapy Documentation Precautions:  Precautions Precautions: Fall Precaution Comments: wounds on bilat feet and L sacrum, monitor orthostatic BPs Required Braces or Orthoses: Other Brace Other Brace: abdominal binder Restrictions Weight Bearing Restrictions: No    Therapy/Group: Individual Therapy  [name] 06/10/2020, 6:57 AM

## 2020-06-10 NOTE — Progress Notes (Signed)
Physical Therapy Session Note  [name] MRN: [id number] Date of Birth: [birth year]  Today's Date: 06/10/2020 PT Individual Time: [id number]  Missed PT time: 60 mins.    PT attempted to see pt for scheduled PT session however upon entering room pt found with eyes closed and emesis bags present. Pt reported he was extremely nauseous and had been actively frequently vomiting since early this AM and had been given medication which had not helped at that time, and medication for now reported constipation discomfort. Pt politely declined any therapy at this time, very apologetic and declined position changes. Pt left at as found and call light in reach.      [name] 06/10/2020, 12:08 PM

## 2020-06-10 NOTE — Progress Notes (Signed)
[facility] PHYSICAL MEDICINE & REHABILITATION PROGRESS NOTE   Subjective/Complaints:  Pt reports no BM yet- has now been 5 days or so-  Now vomiting "all night"-  KUB shows likely ileus.  Hasn't eaten breakfast yet this AM- will change to clear liquid diet due to likely ileus- also since vomiting, will speak to pt again about NG tube.   Already received IV Zofran early this AM- will also give him Compazine prn as well.   ROS:  Pt denies SOB, abd pain, CP, and vision changes  Objective:   DG Abd 1 View  Result Date: 06/10/2020 CLINICAL DATA:  Abdominal pain with nausea vomiting. EXAM: ABDOMEN - 1 VIEW COMPARISON:  No comparison studies available. FINDINGS: Diffuse gaseous distention of small bowel and colon noted without overtly obstructive pattern. Air in stool is seen in the sigmoid colon and rectum. Visualized bony anatomy unremarkable. IMPRESSION: Diffuse gaseous distention of small bowel and colon. Ileus would be a consideration. Pattern is not frankly obstructive. Electronically Signed   By: Misty Stanley M.D.   On: 06/10/2020 08:51   Recent Labs    06/08/20 1330  WBC 8.7  HGB 9.4*  HCT 31.1*  PLT 257   Recent Labs    06/08/20 1330  NA 130*  K 5.2*  CL 95*  CO2 25  GLUCOSE 100*  BUN 55*  CREATININE 7.02*  CALCIUM 8.8*    Intake/Output Summary (Last 24 hours) at 06/10/2020 0955 Last data filed at 06/10/2020 0836 Gross per 24 hour  Intake 120 ml  Output 100 ml  Net 20 ml     Pressure Injury 05/01/20 Buttocks Left Stage 4 - Full thickness tissue loss with exposed bone, tendon or muscle. packed with santyl gauze, dressed by WOCN, (Active)  05/01/20 0230  Location: Buttocks  Location Orientation: Left  Staging: Stage 4 - Full thickness tissue loss with exposed bone, tendon or muscle.  Wound Description (Comments): packed with santyl gauze, dressed by WOCN,  Present on Admission: Yes     Pressure Injury 05/01/20 Ankle Left;Lateral Unstageable - Full thickness  tissue loss in which the base of the injury is covered by slough (yellow, tan, gray, green or brown) and/or eschar (tan, brown or black) in the wound bed. smal circular wound on the (Active)  05/01/20 0230  Location: Ankle  Location Orientation: Left;Lateral  Staging: Unstageable - Full thickness tissue loss in which the base of the injury is covered by slough (yellow, tan, gray, green or brown) and/or eschar (tan, brown or black) in the wound bed.  Wound Description (Comments): smal circular wound on the bone  Present on Admission: Yes     Pressure Injury 05/01/20 Foot Left;Posterior Unstageable - Full thickness tissue loss in which the base of the injury is covered by slough (yellow, tan, gray, green or brown) and/or eschar (tan, brown or black) in the wound bed. 4th and 5th toes appear n (Active)  05/01/20 0230  Location: Foot (4th and 5th toes and lateral/anterior side of the sole)  Location Orientation: Left;Posterior  Staging: Unstageable - Full thickness tissue loss in which the base of the injury is covered by slough (yellow, tan, gray, green or brown) and/or eschar (tan, brown or black) in the wound bed.  Wound Description (Comments): 4th and 5th toes appear necrotic circular wound on sole  Present on Admission: Yes     Pressure Injury 05/01/20 Foot Right;Medial;Posterior Unstageable - Full thickness tissue loss in which the base of the injury is covered by  slough (yellow, tan, gray, green or brown) and/or eschar (tan, brown or black) in the wound bed. large circular wo (Active)  05/01/20 0230  Location: Foot  Location Orientation: Right;Medial;Posterior  Staging: Unstageable - Full thickness tissue loss in which the base of the injury is covered by slough (yellow, tan, gray, green or brown) and/or eschar (tan, brown or black) in the wound bed.  Wound Description (Comments): large circular wound on sole  and heel  Present on Admission: Yes     Pressure Injury 05/08/20 Heel Right  Unstageable - Full thickness tissue loss in which the base of the injury is covered by slough (yellow, tan, gray, green or brown) and/or eschar (tan, brown or black) in the wound bed. (Active)  05/08/20 1718  Location: Heel  Location Orientation: Right  Staging: Unstageable - Full thickness tissue loss in which the base of the injury is covered by slough (yellow, tan, gray, green or brown) and/or eschar (tan, brown or black) in the wound bed.  Wound Description (Comments):   Present on Admission: Yes    Physical Exam: Vital Signs Blood pressure (!) 177/108, pulse 83, temperature 100.2 F (37.9 C), temperature source Oral, resp. rate 20, height '6\' 6"'$  (1.981 m), weight (!) 160.1 kg, SpO2 99 %.   Physical Exam Gen: awake, alert, laying supine in bed- trying to vomit in bag, no acute distress HEENT: oral mucosa pink and moist, NCAT Cardio: RRR Chest: CTA B/L- no W/R/R- good air movement Abd: soft, diffuse mild TTP- no rebound; more normoactive Ext: no edema Psych: feels ill Skin:    Comments: Sacral decubitus as well as bilateral heel dressings are in place change routinely per wound care nurse  Neurological: more Oriented    Comments: Patient is alert in no acute distress. 5/5 strength throughout.     Assessment/Plan: 1. Functional deficits which require 3+ hours per day of interdisciplinary therapy in a comprehensive inpatient rehab setting.  Physiatrist is providing close team supervision and 24 hour management of active medical problems listed below.  Physiatrist and rehab team continue to assess barriers to discharge/monitor patient progress toward functional and medical goals  Care Tool:  Bathing    Body parts bathed by patient: Right arm,Left arm,Chest,Abdomen,Front perineal area,Buttocks,Right upper leg,Left upper leg,Face   Body parts bathed by helper: Right lower leg,Left lower leg,Buttocks     Bathing assist Assist Level: Moderate Assistance - Patient 50 - 74%      Upper Body Dressing/Undressing Upper body dressing   What is the patient wearing?: Pull over shirt    Upper body assist Assist Level: Moderate Assistance - Patient 50 - 74%    Lower Body Dressing/Undressing Lower body dressing      What is the patient wearing?: Pants,Incontinence brief     Lower body assist Assist for lower body dressing: Maximal Assistance - Patient 25 - 49%     Toileting Toileting    Toileting assist Assist for toileting: Maximal Assistance - Patient 25 - 49% Assistive Device Comment:  (urinal)   Transfers Chair/bed transfer  Transfers assist  Chair/bed transfer activity did not occur: Safety/medical concerns (Per nursing report)  Chair/bed transfer assist level: Moderate Assistance - Patient 50 - 74% (slide board)     Locomotion Ambulation   Ambulation assist   Ambulation activity did not occur: Safety/medical concerns (fatigue, lightheadedness, generalized weakness)          Walk 10 feet activity   Assist  Walk 10 feet activity did not occur: Safety/medical  concerns (fatigue, lightheadedness, generalized weakness)        Walk 50 feet activity   Assist Walk 50 feet with 2 turns activity did not occur: Safety/medical concerns (fatigue, lightheadedness, generalized weakness)         Walk 150 feet activity   Assist Walk 150 feet activity did not occur: Safety/medical concerns (fatigue, lightheadedness, generalized weakness)         Walk 10 feet on uneven surface  activity   Assist Walk 10 feet on uneven surfaces activity did not occur: Safety/medical concerns (fatigue, lightheadedness, generalized weakness)         Wheelchair     Assist Will patient use wheelchair at discharge?: Yes Type of Wheelchair: Manual    Wheelchair assist level: Supervision/Verbal cueing Max wheelchair distance: 15'    Wheelchair 50 feet with 2 turns activity    Assist        Assist Level: Supervision/Verbal cueing    Wheelchair 150 feet activity     Assist      Assist Level: Minimal Assistance - Patient > 75%   Blood pressure (!) 177/108, pulse 83, temperature 100.2 F (37.9 C), temperature source Oral, resp. rate 20, height '6\' 6"'$  (1.981 m), weight (!) 160.1 kg, SpO2 99 %.  Medical Problem List and Plan: 1.  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   1/20- will likely miss a lot of therapy today due to nausea/vomiting             -ELOS/Goals: MinA 2-3 weeks. 2.  Antithrombotics: -DVT/anticoagulation: Subcutaneous heparin.  Check vascular study             -antiplatelet therapy: N/A 3. Pain Management: Tylenol as needed 4. Mood: Provide emotional support             -antipsychotic agents: N/A 5. Neuropsych: This patient is? capable of making decisions on his own behalf.  1/17- pt appeared slightly confused but amiable this AM- will monitor  1/20- more with it this AM 6. Skin/Wound Care: Routine skin checks 7. Fluids/Electrolytes/Nutrition: Routine in and outs with follow-up chemistries 8.  End-stage renal disease.  Hemodialysis per renal services 9.  Sacral decubitus ulcer as well as ischemic ulcers on the left foot fourth toe and fifth metatarsal head and right heel and fifth metatarsal head.  Wound care nurse follow-up.  Patient to see Dr. Sharol Given outpatient.  Continue bilateral PRAFO's. 10.  ID/ coccygeal osteomyelitis.  Continue vancomycin and cefepime through 06/10/2020  1/17- WBC 8.1  1/18- will schedule turning q2 hours to get off wounds- also will make sure on specialty bed  1/20- is on specialty bed 10.  Anemia of chronic disease.  Continue Aranesp. Hgb 8.8 on 1/16, repeat with HD  1/17- no CBC today, only BMP- will try and check with HD on Tuesday.   1/19- doing better based on labs yesterday 11.  Diabetes mellitus.  Hemoglobin A1c 5.6.  Lantus insulin 14 units nightly              1/16: CBGs 108-167: continue current regimen.   1/17- BGs 102-160- con't regimen  1/18- per pharmacy, want to decrease his insulin due to fasting BG of 85- will decrease to 11 units, and wasn't on insulin at home. Based on ESRD, will NEED insulin at home.   1/19- will let nursing know to teach pt- placed Diabetes coordinator  consult yesterday- waiting for them to see pt.  12.  Morbid obesity.  BMI 38.37.  Dietary follow-up  13. Orthostatic hypotension with HTN  1/17- Sx's so significant, vomited this AM when got up- will try abd binder and ACE wraps for LEs- since due to osteomyelitis, don't think can use TEDs- if need be, can add Midodrine, but due to ESRD, cannot start Florinef.  1/18- BP laying is 164/101 this AM- might not do well with midodrine?  1/19- since not clear if actually orthostatic, per renal, will order daily orthostatics x7 days.   1/20- BP 177/108 this AM- used to be on Metoprolol and higher dose of  Norvasc- will give 1x time Metoprolol 12.5 mg since BP so high AND pulse is in 80s.  14. Diarrhea with new constipation  1/18- LBM 3 days ago- on imodium 2 mg q4 hours around the clock- will change to q8 hours to see if can get him to have BM.  1/19- wil give Sorbitol 60 ml today since no BM- maintain lower dose of imodium per pt request.  1/20- thinks sorbitol made him sick- it's actually the ileus that made him sick- refusing NG tube- will give Suppository per pt request (KUB shows likely ileus) and changed diet to clear liquid diet for now.    LOS: 4 days A FACE TO FACE EVALUATION WAS PERFORMED  Astella Desir 06/10/2020, 9:55 AM

## 2020-06-10 NOTE — Progress Notes (Signed)
Pt stated he was nauseous this morning, IV Zofran given. Pt stated he could not take oral medication at this time or eat his Breakfast due to nausea. No other concerns to report.

## 2020-06-11 ENCOUNTER — Encounter (HOSPITAL_COMMUNITY): Payer: Medicare Other | Admitting: Psychology

## 2020-06-11 ENCOUNTER — Inpatient Hospital Stay (HOSPITAL_COMMUNITY): Payer: Medicare Other

## 2020-06-11 ENCOUNTER — Inpatient Hospital Stay (HOSPITAL_COMMUNITY): Payer: Medicare Other | Admitting: Occupational Therapy

## 2020-06-11 ENCOUNTER — Inpatient Hospital Stay (HOSPITAL_COMMUNITY): Payer: Medicare Other | Admitting: Physical Therapy

## 2020-06-11 LAB — GLUCOSE, CAPILLARY
Glucose-Capillary: 94 mg/dL (ref 70–99)
Glucose-Capillary: 86 mg/dL (ref 70–99)
Glucose-Capillary: 106 mg/dL — ABNORMAL HIGH (ref 70–99)
Glucose-Capillary: 119 mg/dL — ABNORMAL HIGH (ref 70–99)

## 2020-06-11 MED ORDER — CHLORHEXIDINE GLUCONATE CLOTH 2 % EX PADS
6.0000 | MEDICATED_PAD | Freq: Every day | CUTANEOUS | Status: DC
  Administered 2020-06-12 – 2020-06-13 (×2): 6 via TOPICAL

## 2020-06-11 MED ORDER — SORBITOL 70 % SOLN
30.0000 mL | Freq: Once | Status: AC
  Administered 2020-06-11: 30 mL via ORAL
  Filled 2020-06-11: qty 30

## 2020-06-11 NOTE — Progress Notes (Signed)
Physical Therapy Session Note  [name] MRN: [id number] Date of Birth: December 02, 1970  Today's Date: 06/11/2020 PT Missed Time: 41 Minutes Missed Time Reason: Patient ill (Comment) (frequent N/V + constipation discomfort)  Short Term Goals: Week 1:  PT Short Term Goal 1 (Week 1): pt will perform all bed mobility with CGA PT Short Term Goal 2 (Week 1): Pt will transfer sit<>stand with LRAD and mod A PT Short Term Goal 3 (Week 1): Pt will transfer bed<>chair with LRAD and min A  Skilled Therapeutic Interventions/Progress Updates:    Patient received supine in bed, emesis bag in hand, but asleep. Patient reporting continued intense nausea with occasional vomiting + constipation discomfort. Requesting to not participate in therapy this AM. Bed alarm on, call light within reach, RN aware.   Therapy Documentation Precautions:  Precautions Precautions: Fall Precaution Comments: wounds on bilat feet and L sacrum, monitor orthostatic BPs Required Braces or Orthoses: Other Brace Other Brace: abdominal binder Restrictions Weight Bearing Restrictions: No    Therapy/Group: Individual Therapy  Karoline Caldwell, PT, DPT, CBIS  06/11/2020, 7:38 AM

## 2020-06-11 NOTE — Plan of Care (Signed)
Fred [name] [id number] 09-Jan-1971  Chart reviewed.  Labs pre HD yesterday stable. Oxygenating 100% on RA.  Plan for HD tomorrow per regular schedule.  Truncated treatment due to high patient census and staffing issues.    Jen Mow, PA-C [facility] Pager: [phone]

## 2020-06-11 NOTE — Progress Notes (Signed)
Nutrition Follow-up  RD working remotely.  DOCUMENTATION CODES:   Morbid obesity  INTERVENTION:   - Continue renal MVI daily  - Continue ProSource Plus 30 ml po TID, each supplement provides 100 kcals and 15 grams protein  - Continue Nepro Shake po BID, each supplement provides 425 kcal and 19 grams protein  - Continue double protein portions at each meal  NUTRITION DIAGNOSIS:   Increased nutrient needs related to wound healing as evidenced by estimated needs.  Ongoing  GOAL:   Patient will meet greater than or equal to 90% of their needs  Unmet at this time  MONITOR:   PO intake,Supplement acceptance,Labs,Weight trends,Skin,I & O's  REASON FOR ASSESSMENT:   Malnutrition Screening Tool    ASSESSMENT:   50 year old right-handed male with PMH of HTN, DM, ESRD on HD. Pt admitted with critical illness/mypoathy/debilitation secondary to COVID-19.  1/20 - KUB showing possible ileus, diet changed to clear liquids 1/21 - diet advanced back to carb modified  Per notes, pt continues to struggle with N/V related to ileus. Pt remains constipated. Per Nephrology note, last HD was yesterday per pt's regular schedule.  Unable to reach pt via phone call to room. Will continue with current oral nutrition supplement regimen now that diet has been advanced back to Carb Modified. Will monitor PO intake and symptoms related to ileus. If ileus does not improve, recommend considering TPN for nutrition support given pt's elevated kcal and protein needs related to wound healing.  Meal Completion: 0-90% x last 8 documented meals  Medications reviewed and include: ProSource Plus TID, aranesp, pepcid, Nepro BID, SSI, lantus 11 units daily, fosrenol TID, reglan 5 mg TID, rena-vit, sorbitol once  Labs reviewed: phosphorus 6.7, hemoglobin 9.6 CBG's: 86-119 x 24 hours  NUTRITION - FOCUSED PHYSICAL EXAM:  Unable to complete at this time. RD working remotely.  Diet Order:   Diet Order             Diet Carb Modified Fluid consistency: Thin; Room service appropriate? Yes  Diet effective now                 EDUCATION NEEDS:   No education needs have been identified at this time  Skin:  Skin Assessment: Skin Integrity Issues: Stage IV: left buttock Unstageable: left lateral ankle, left posterior foot, right medial foot, right heel  Last BM:  06/10/20 type 7  Height:   Ht Readings from Last 1 Encounters:  06/06/20 '6\' 6"'$  (1.981 m)    Weight:   Wt Readings from Last 1 Encounters:  06/10/20 (!) 158.3 kg    Ideal Body Weight:  97.3 kg  BMI:  Body mass index is 40.33 kg/m.  Estimated Nutritional Needs:   Kcal:  [id number]  Protein:  165-195 grams  Fluid:  1.2 L    Gustavus Bryant, MS, RD, LDN Inpatient Clinical Dietitian Please see AMiON for contact information.

## 2020-06-11 NOTE — Consult Note (Signed)
Neuropsychological Consultation   Patient:   Fred [name]   DOB:   [birth year]  MR Number:  [id number]  Location:  [facility] [address] CENTER [address] [place] [id number] [place] [id number] Dept: [place]: [phone]           Date of Service:   06/11/2020  Start Time:   10 AM End Time:   10:30 AM  Provider/Observer:  Ilean Skill, Psy.D.       Clinical Neuropsychologist       Billing Code/Service: [phone]  Chief Complaint:    Fred [name] is a 50 year old male with history of hypertension, diabetes, obesity, end-stage renal disease with hemodialysis.  There is reported medical noncompliance with hypertensive medication.  Patient has a very complicated medical history with significant illness.  Patient has been living with his 17 year old father independent prior to initial admission to outside hospital after having her gallbladder removed and had a subsequent fall while in the hospital.  Patient remained in bed for extended period of time and was eventually transferred to skilled nursing..  Patient presented on 04/29/2020 from skilled nursing facility where he has been for an extended time for events prior and tested positive for COVID-19 on 04/20/2020.  Patient had had issues related to his gallbladder and essentially was bedbound developing multiple significant wound sites.  Patient had noted bilateral airspace disease correlated with COVID-19 status.  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  Patient referred for CIR due to profound deconditioning and multi medical issues.  Reason for Service:  Patient was referred for neuropsychological consultation due to significant coping and adjustment issues and severe deconditioning over an extended period of time.  Below is the HPI for the current admission.  HPI: Fred [name] is a 50 year old right-handed male with history of  hypertension, diabetes mellitus, obesity with BMI 38.37, end-stage renal disease with hemodialysis Tuesday Thursday Saturday at [facility] and reported medical noncompliance with hypertensive medications.  Patient with a long complicated history.  Prior to patient's initial hospital admission he lived with his 15 year old father in [place] independent prior to admission.  1 level home with ramped entrance.  Presented 04/29/2020 from [place] skilled nursing facility where he had been for an extended time for events from COVID-19 testing + 04/20/2020 and issues related to his gallbladder and essentially was bedbound developing a sacral decubitus as well as bilateral heel wounds.  On admission patient with increasing shortness of breath as well as low-grade fever 100.9.  Sacral ulcer did have a foul-smelling odor.  Admission chemistries lactic acid 0.9, blood cultures no growth to date, BUN 11, creatinine 4.41, WBC 5800, hemoglobin 7.8, pro calcitonin 1.24.  Chest x-ray showed diffuse bilateral airspace disease correlated with COVID-19 status.  MRI of the left foot area of ulceration with nailbed irregularity at the first digit with findings suggestive of osteomyelitis involving the first digit distal tuft.  No loculated fluid collection or sinus tract and right foot MRI abnormal osseous edema head of the fifth metatarsal suspicious for osteomyelitis.  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  Surgery was consulted in regards to sacral and heel wounds and was receiving hydrotherapy.  Placed on vancomycin and cefepime to be ongoing through 06/10/2020.  Dr. Sharol Given was consulted in regards to unstageable pressure wounds of left lateral foot lateral malleolus right lateral foot and heel  dry gangrene of left fourth toe as well as left first metatarsal and right fifth meta tarsal advised Prevalon boots and would follow as  outpatient.  Hemodialysis ongoing as per renal services monitoring closely for bouts of hypotension.  Subcutaneous heparin for DVT prophylaxis.  Due to patient's profound deconditioning multimedical issues he was admitted for a comprehensive rehabilitation program. Currently denies pain. AOx3.   Current Status:  First presented to the patient's room for 8:00 appointment but the patient was asleep and still very groggy when he awoken.  Patient requested to see later in the morning and that he did not feel well and was very tired.  Return to the patient's room at 10 AM and patient was again in the room with the lights off asleep.  Apparently patient had turned down therapy session at 9 AM.    Patient remained rather drowsy but awake for visit.  Patient talked of his difficulty with GI issues but reported that he was motivated to get functional gains and that he had not been able to walk independently for quite some time.  He described events around his gallbladder removal and subsequent fall at outside hospital and then being told while he was in the hospital that he was not allowed to walk anymore.  Patient then went to skilled nursing facility with little rehab efforts and also contracted COVID-19 with respiratory issues.  Patient is deconditioned and significantly weak.  Patient denied severe depression but did acknowledge melancholy and frustration around his limited functional abilities and functional decline.  Behavioral Observation: [name]  presents as a 50 y.o.-year-old Right Caucasian Male who appeared his stated age. his dress was Appropriate and he was Well Groomed and his manners were Appropriate, inappropriate to the situation.  his participation was indicative of Drowsy and Inattentive behaviors.  There were physical disabilities noted.  he displayed an inappropriate level of cooperation and motivation.     Interactions:    Minimal Drowsy and Inattentive  Attention:   abnormal and  attention span appeared shorter than expected for age  Memory:   within normal limits; recent and remote memory intact  Visuo-spatial:  not examined  Speech (Volume):  low  Speech:   normal; slowed responses  Thought Process:  Coherent and Relevant  Though Content:  WNL; not suicidal and not homicidal  Orientation:   person, place, time/date and situation  Judgment:   Fair  Planning:   Poor  Affect:    Blunted, Depressed, Flat and Lethargic  Mood:    Dysphoric  Insight:   Fair  Intelligence:   normal  Medical History:   Past Medical History:  Diagnosis Date   Chronic kidney disease    HD MWF   Diabetes mellitus without complication ([place])    Hypertension          Patient Active Problem List   Diagnosis Date Noted   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   Avascular necrosis of first metatarsal, left foot ([place]) 04/30/2020   Osteomyelitis of dorsal first metatarsal, left foot ([place]) 04/30/2020   Decubitus ulcer of sacral region, unstageable ([place]) 04/30/2020   Morbid obesity ([place]) 04/30/2020   Multiple open wounds of foot    Insulin dependent type 2 diabetes mellitus ([place]) 04/29/2020   HTN (hypertension) 04/29/2020   History of anemia due to chronic kidney disease 04/29/2020   Sacral osteomyelitis ([place]) 04/29/2020   Pressure injury of both heels, unstageable ([place]) 04/29/2020   Pneumonia due to COVID-19 virus 04/29/2020   ESRD (  end stage renal disease) ([place]) 04/29/2020    Psychiatric History:  No prior psychiatric history noted.  Family Med/Psych History: History reviewed. No pertinent family history.  Impression/DX:  Devon Cozza is a 50 year old male with history of hypertension, diabetes, obesity, end-stage renal disease with hemodialysis.  There is reported medical noncompliance with hypertensive medication.  Patient has a very complicated medical history with significant illness.  Patient has been living with his 28 year old father independent  prior to initial admission to outside hospital after having her gallbladder removed and had a subsequent fall while in the hospital.  Patient remained in bed for extended period of time and was eventually transferred to skilled nursing..  Patient presented on 04/29/2020 from skilled nursing facility where he has been for an extended time for events prior and tested positive for COVID-19 on 04/20/2020.  Patient had had issues related to his gallbladder and essentially was bedbound developing multiple significant wound sites.  Patient had noted bilateral airspace disease correlated with COVID-19 status.  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  Patient referred for CIR due to profound deconditioning and multi medical issues.  First presented to the patient's room for 8:00 appointment but the patient was asleep and still very groggy when he awoken.  Patient requested to see later in the morning and that he did not feel well and was very tired.  Return to the patient's room at 10 AM and patient was again in the room with the lights off asleep.  Apparently patient had turned down therapy session at 9 AM.    Patient remained rather drowsy but awake for visit.  Patient talked of his difficulty with GI issues but reported that he was motivated to get functional gains and that he had not been able to walk independently for quite some time.  He described events around his gallbladder removal and subsequent fall at outside hospital and then being told while he was in the hospital that he was not allowed to walk anymore.  Patient then went to skilled nursing facility with little rehab efforts and also contracted COVID-19 with respiratory issues.  Patient is deconditioned and significantly weak.  Patient denied severe depression but did acknowledge melancholy and frustration around his limited functional abilities and functional decline.  Disposition/Plan:  Today there was an attempt  to work on coping and adjustment with extended hospital stay and significant medical difficulties including deep pressure wounds and severe deconditioning/debility.  It was hard to ascertain how much medical illness and acute GI issues versus motivation and willingness are playing a role in his status.  Diagnosis:    Critical Illness myopathy      Electronically Signed   _______________________ Ilean Skill, Psy.D. Clinical Neuropsychologist

## 2020-06-11 NOTE — Progress Notes (Signed)
Occupational Therapy Session Note  [name] MRN: [id number] Date of Birth: 1971/02/09  Today's Date: 06/11/2020 OT Individual Time: 1100-1153 OT Individual Time Calculation (min): 53 min    Short Term Goals: Week 1:  OT Short Term Goal 1 (Week 1): patient will completed bed mobility with CS OT Short Term Goal 2 (Week 1): patient will complete sit pivot transfers with min A OT Short Term Goal 3 (Week 1): patient will complete lower body bathing and dressing with min A and use of assistive devices as needed OT Short Term Goal 4 (Week 1): patient will tolerate sitting out of bed for 3-4 hours   Skilled Therapeutic Interventions/Progress Updates:    Pt greeted at time of session supine in bed sleeping with lights off, woken with calling name and minimal verbal cues. Pt not feeling well, aware he missed PT session this am d/t N/V but agreeable to OT session with mod encouragement and agreeable to EOB ADL. Pt noted to have smear in brief, dependent for brief change and hygiene to clean smear. Supine to sit EOB Min A pausing partially through trunk elevation d/t dizziness before coming all the way up to sitting EOB. Donned binder seated and wore while performing LB bathing with Mod A, removed for UB bathing and dressing which was Min A overall. 1 posterior LOB noted but able to correct by holding onto rail. UB dress Mod A d/t dizziness with pull over shirt and LB dress bed level with Min A to thread with bringing feet up to himself, able to roll L/R Supervision to don pants over hips CGA. Brought reacher into room but did not need for LB dress, pt says he will see if he needs it over weekend for easier access to his environment. Supine in bed resting with sister on phone, call bell in reach. Note pt did have dizziness in sitting EOB with some activity, improved with static sitting. No N/V today during session.   Therapy Documentation Precautions:  Precautions Precautions:  Fall Precaution Comments: wounds on bilat feet and L sacrum, monitor orthostatic BPs Required Braces or Orthoses: Other Brace Other Brace: abdominal binder Restrictions Weight Bearing Restrictions: No     Therapy/Group: Individual Therapy  [name] 06/11/2020, 12:13 PM

## 2020-06-11 NOTE — Progress Notes (Signed)
Physical Therapy Session Note  [name] MRN: [id number] Date of Birth: 04-30-1971  Today's Date: 06/11/2020 PT Individual Time: 1430-1500 PT Individual Time Calculation (min): 30 min   Short Term Goals: Week 1:  PT Short Term Goal 1 (Week 1): pt will perform all bed mobility with CGA PT Short Term Goal 2 (Week 1): Pt will transfer sit<>stand with LRAD and mod A PT Short Term Goal 3 (Week 1): Pt will transfer bed<>chair with LRAD and min A  Skilled Therapeutic Interventions/Progress Updates:    pt received in bed and agreeable to therapy, nursing exiting room. Pt reported nausea in supine and declined to get out of bed but agreeable to sitting EOB if able. Pt directed in supine>sit mod A and reported continued dizziness and nausea. Pt sat EOB for 20 mins at CGA-SBA with VC for trunk extension, initial sitting position and hand placement. Pt declined seated BLE strengthening exercises and reported symptoms worsening sitting. Pt directed in sit>supine CGA and began dry heaving. Pt given emesis bag. Pt declinded further needs and requested to end therapy at this time. Pt left supine in bed, All needs in reach and in good condition. Call light in hand.    Therapy Documentation Precautions:  Precautions Precautions: Fall Precaution Comments: wounds on bilat feet and L sacrum, monitor orthostatic BPs Required Braces or Orthoses: Other Brace Other Brace: abdominal binder Restrictions Weight Bearing Restrictions: No General: PT Missed Treatment Reason: Patient ill (Comment) Vital Signs: Therapy Vitals Temp: 98.1 F (36.7 C) Temp Source: Oral Pulse Rate: 88 Resp: 16 BP: (!) 157/87 Patient Position (if appropriate): Lying Oxygen Therapy SpO2: 100 % O2 Device: Room Air Pain:   Mobility:   Locomotion :    Trunk/Postural Assessment :    Balance:   Exercises:   Other Treatments:      Therapy/Group: Individual Therapy  [name] 06/11/2020,  3:42 PM

## 2020-06-12 ENCOUNTER — Inpatient Hospital Stay (HOSPITAL_COMMUNITY): Payer: Medicare Other | Admitting: Physical Therapy

## 2020-06-12 ENCOUNTER — Inpatient Hospital Stay (HOSPITAL_COMMUNITY): Payer: Medicare Other | Admitting: Occupational Therapy

## 2020-06-12 LAB — RENAL FUNCTION PANEL
Albumin: 1.9 g/dL — ABNORMAL LOW (ref 3.5–5.0)
Anion gap: 9 (ref 5–15)
BUN: 24 mg/dL — ABNORMAL HIGH (ref 6–20)
CO2: 28 mmol/L (ref 22–32)
Calcium: 8.5 mg/dL — ABNORMAL LOW (ref 8.9–10.3)
Chloride: 99 mmol/L (ref 98–111)
Creatinine, Ser: 5.19 mg/dL — ABNORMAL HIGH (ref 0.61–1.24)
GFR, Estimated: 13 mL/min — ABNORMAL LOW (ref >60)
Glucose, Bld: 113 mg/dL — ABNORMAL HIGH (ref 70–99)
Phosphorus: 4.7 mg/dL — ABNORMAL HIGH (ref 2.5–4.6)
Potassium: 3.9 mmol/L (ref 3.5–5.1)
Sodium: 136 mmol/L (ref 135–145)

## 2020-06-12 LAB — CBC
HCT: 31.1 % — ABNORMAL LOW (ref 39.0–52.0)
Hemoglobin: 9.7 g/dL — ABNORMAL LOW (ref 13.0–17.0)
MCH: 26.6 pg (ref 26.0–34.0)
MCHC: 31.2 g/dL (ref 30.0–36.0)
MCV: 85.2 fL (ref 80.0–100.0)
Platelets: 221 10*3/uL (ref 150–400)
RBC: 3.65 MIL/uL — ABNORMAL LOW (ref 4.22–5.81)
RDW: 16.7 % — ABNORMAL HIGH (ref 11.5–15.5)
WBC: 5.9 10*3/uL (ref 4.0–10.5)
nRBC: 0 % (ref 0.0–0.2)

## 2020-06-12 LAB — GLUCOSE, CAPILLARY
Glucose-Capillary: 110 mg/dL — ABNORMAL HIGH (ref 70–99)
Glucose-Capillary: 108 mg/dL — ABNORMAL HIGH (ref 70–99)
Glucose-Capillary: 112 mg/dL — ABNORMAL HIGH (ref 70–99)
Glucose-Capillary: 151 mg/dL — ABNORMAL HIGH (ref 70–99)

## 2020-06-12 MED ORDER — SORBITOL 70 % SOLN
60.0000 mL | Freq: Once | Status: AC
  Administered 2020-06-12: 60 mL via ORAL
  Filled 2020-06-12: qty 60

## 2020-06-12 NOTE — Progress Notes (Signed)
Patient c/o of feeling nausea. PRN zofran given via IV access. Awaiting results. No further complications noted at this time. Audie Clear, LPN

## 2020-06-12 NOTE — Progress Notes (Signed)
Patient just arrived to unit from dialysis, no complications noted at this time. Audie Clear, LPN

## 2020-06-12 NOTE — Progress Notes (Signed)
  [facility] Progress Note   Subjective:   Seen on HD - 2L UFG and tolerating. C/o occ dizziness with standing for the past few days. No CP or dyspnea.  Objective Vitals:   06/11/20 0513 06/11/20 1321 06/11/20 1939 06/12/20 0611  BP: (!) 170/109 (!) 157/87 (!) 145/94 (!) 174/95  Pulse: 93 88 92 87  Resp: '17 16 17 18  '$ Temp: 99.5 F (37.5 C) 98.1 F (36.7 C) 98.7 F (37.1 C) 98.6 F (37 C)  TempSrc: Oral Oral  Oral  SpO2: 100% 100% 98% 99%  Weight:      Height:       Physical Exam General: Well appearing man, NAD. Room air. Heart: RRR; no murmur Lungs: CTA anteriorly Abdomen: soft, non-tender Extremities: No LE edema Dialysis Access: [facility]  Additional Objective Labs: Basic Metabolic Panel: Recent Labs  Lab 06/06/20 1453 06/08/20 1330 06/10/20 1440  NA 135 130* 135  K 4.4 5.2* 4.6  CL 97* 95* 96*  CO2 '26 25 27  '$ GLUCOSE 129* 100* 112*  BUN 33* 55* 40*  CREATININE 4.91* 7.02* 6.80*  CALCIUM 8.6* 8.8* 8.8*  PHOS 5.6* 7.0* 6.7*   Liver Function Tests: Recent Labs  Lab 06/06/20 1453 06/08/20 1330 06/10/20 1440  ALBUMIN 1.9* 2.0* 2.0*   CBC: Recent Labs  Lab 06/05/20 1839 06/06/20 0159 06/08/20 1330 06/10/20 1440 06/12/20 1400  WBC 9.0 8.1 8.7 9.6 5.9  HGB 9.5* 8.8* 9.4* 9.6* 9.7*  HCT 31.5* 29.5* 31.1* 31.7* 31.1*  MCV 87.5 88.3 86.6 86.1 85.2  PLT 245 240 257 276 221   Medications:  . (feeding supplement) PROSource Plus  30 mL Oral TID BM  . amLODipine  5 mg Oral QHS  . Chlorhexidine Gluconate Cloth  6 each Topical Daily  . Chlorhexidine Gluconate Cloth  6 each Topical Q0600  . [START ON 06/15/2020] darbepoetin (ARANESP) injection - DIALYSIS  200 mcg Intravenous Q Tue-HD  . famotidine  20 mg Oral QHS  . feeding supplement (NEPRO CARB STEADY)  237 mL Oral BID BM  . heparin injection (subcutaneous)  7,500 Units Subcutaneous Q8H  . insulin aspart  0-9 Units Subcutaneous TID WC  . insulin glargine  11 Units Subcutaneous QHS  .  lanthanum  500 mg Oral TID WC  . metoCLOPramide  5 mg Oral TID AC  . multivitamin  1 tablet Oral QHS    Dialysis Orders: NWTTS 4h 400/800 143.5kg 3K/2.5Ca TDCHeparin3000units IV TIW - Mircera133mgIVq2wks - last 12/8 - Venofer '100mg'$ IVqHD x10 - completed 6/10 doses  Problem/Plan: 1. Sacral decub/coccygealosteo: S/p 6 weeks of Vanc/Cefepime (finished 06/10/20) - ID following.Continues hydrotherapy. In CIR. 2. 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 3. ESRD: Back on usual TTS schedule - HD today. Truncated HD due to high patient volume/staffing issues.  4. Diarrhea -Recurrent issue, thought to be d/t Renvela? Follow. 5. BP/volume:BPimproving.Previously having issues with hypotension which improved once amlodipine and metoprolol d/c'd. Keep SBP >100 on HD.Has had problems withorthostatic hypotension. 6.  COVID PNA- tested positive on 11/30, is off isolation now.  7. Anemiaof CKD: Hgb 9.7, cont Aranesp 2021mqSat 8. Secondary Hyperparathyroidism: CorrCa ok, Phos high.Not on VDRA.Stoppedrenvela 2/2diarrhea, phos now elevated again, trying Fosrenol instead. 9. Nutrition: Alb remains low, continue Nepro. 10. DMT2 - per admit   KaVeneta PentonPA-C 06/12/2020, 2:28 PM  [facility]

## 2020-06-12 NOTE — Progress Notes (Signed)
Patient transferred to dialysis at this time. No complications noted. No c/o of nausea at this time. Audie Clear, LPN

## 2020-06-12 NOTE — Progress Notes (Signed)
Physical Therapy Session Note  [name] MRN: [id number] Date of Birth: [birth year]  Today's Date: 06/12/2020 PT Individual Time: 0730-0827 PT Individual Time Calculation (min): 57 min   Short Term Goals: Week 1:  PT Short Term Goal 1 (Week 1): pt will perform all bed mobility with CGA PT Short Term Goal 2 (Week 1): Pt will transfer sit<>stand with LRAD and mod A PT Short Term Goal 3 (Week 1): Pt will transfer bed<>chair with LRAD and min A     Skilled Therapeutic Interventions/Progress Updates:    pt received in bed and agreeable to therapy but reported continued nausea and dizziness but wanted to try. Pt directed in supine>sit mod A for trunk support into sitting EOB.  BP taken to be 121/84 in sitting. Pt agreeable to attempt slide board transfer to [facility], mod A for trunk stability for board placement. Pt directed in slide board transfer to pt's Rt mod A however pt unable to complete and reported at halfway point on transfer he was unable to get to [facility] and was too nauseous to sit up. Pt mod A to return to bedside fully and then pt requested to rest at bedside to attempt to relieve some of his nausea. Pt sat EOB for 3-4 mins with BLE and BUE supported and reported some relieve, pt agreeable to EOB activity. MD then present for rounds, reported she ordered a vestibular eval, PT agreed to complete. Pt then directed in sit>supine min A and CGA for upright scooting in bed for improved positioning. PT educated pt on vestibular test and mechanics of this, pt agreed and PT completed testing on Rt and Lt side both (-) pt denied additional dizziness and demonstrated no nystagmus either side. Pt directed in supine>sit mod A, sat EOB for 25 additional mins at CGA with VC for midline, trunk extension, head carriage. Pt directed in seated BLE and BUE strengthening with 2# weights of LAQ, marching 2x10 and chest press, overhead press, bicep curls. Pt required rest breaks post each set 2/2  fatigue and nausea. Pt then returned to supine CGA. Pt requested to be left in bed at end of session, All needs in reach and in good condition. Call light in hand.    Therapy Documentation Precautions:  Precautions Precautions: Fall Precaution Comments: wounds on bilat feet and L sacrum, monitor orthostatic BPs Required Braces or Orthoses: Other Brace Other Brace: abdominal binder Restrictions Weight Bearing Restrictions: No General:   Vital Signs:  Pain:   Mobility:   Locomotion :    Trunk/Postural Assessment :    Balance:   Exercises:   Other Treatments:      Therapy/Group: Individual Therapy  [name] 06/12/2020, 12:39 PM

## 2020-06-12 NOTE — Progress Notes (Signed)
[facility] PHYSICAL MEDICINE & REHABILITATION PROGRESS NOTE   Subjective/Complaints:  Pt reports has a very small BM yesterday- after meds Dizzy when raises up too fast, or sits up-  Nausea a little better, but what has, is related to dizziness/vertigo Sx's.   Spoke with PT- will get him evaluated for vertigo.   ROS:  Pt denies SOB, abd pain, CP, N/V/C/D, and vision changes   Objective:   No results found. Recent Labs    06/10/20 1440  WBC 9.6  HGB 9.6*  HCT 31.7*  PLT 276   Recent Labs    06/10/20 1440  NA 135  K 4.6  CL 96*  CO2 27  GLUCOSE 112*  BUN 40*  CREATININE 6.80*  CALCIUM 8.8*    Intake/Output Summary (Last 24 hours) at 06/12/2020 1224 Last data filed at 06/12/2020 0435 Gross per 24 hour  Intake 200 ml  Output 300 ml  Net -100 ml     Pressure Injury 05/01/20 Buttocks Left Stage 4 - Full thickness tissue loss with exposed bone, tendon or muscle. packed with santyl gauze, dressed by WOCN, (Active)  05/01/20 0230  Location: Buttocks  Location Orientation: Left  Staging: Stage 4 - Full thickness tissue loss with exposed bone, tendon or muscle.  Wound Description (Comments): packed with santyl gauze, dressed by WOCN,  Present on Admission: Yes     Pressure Injury 05/01/20 Ankle Left;Lateral Unstageable - Full thickness tissue loss in which the base of the injury is covered by slough (yellow, tan, gray, green or brown) and/or eschar (tan, brown or black) in the wound bed. smal circular wound on the (Active)  05/01/20 0230  Location: Ankle  Location Orientation: Left;Lateral  Staging: Unstageable - Full thickness tissue loss in which the base of the injury is covered by slough (yellow, tan, gray, green or brown) and/or eschar (tan, brown or black) in the wound bed.  Wound Description (Comments): smal circular wound on the bone  Present on Admission: Yes     Pressure Injury 05/01/20 Foot Left;Posterior Unstageable - Full thickness tissue loss in which  the base of the injury is covered by slough (yellow, tan, gray, green or brown) and/or eschar (tan, brown or black) in the wound bed. 4th and 5th toes appear n (Active)  05/01/20 0230  Location: Foot (4th and 5th toes and lateral/anterior side of the sole)  Location Orientation: Left;Posterior  Staging: Unstageable - Full thickness tissue loss in which the base of the injury is covered by slough (yellow, tan, gray, green or brown) and/or eschar (tan, brown or black) in the wound bed.  Wound Description (Comments): 4th and 5th toes appear necrotic circular wound on sole  Present on Admission: Yes     Pressure Injury 05/01/20 Foot Right;Medial;Posterior Unstageable - Full thickness tissue loss in which the base of the injury is covered by slough (yellow, tan, gray, green or brown) and/or eschar (tan, brown or black) in the wound bed. large circular wo (Active)  05/01/20 0230  Location: Foot  Location Orientation: Right;Medial;Posterior  Staging: Unstageable - Full thickness tissue loss in which the base of the injury is covered by slough (yellow, tan, gray, green or brown) and/or eschar (tan, brown or black) in the wound bed.  Wound Description (Comments): large circular wound on sole  and heel  Present on Admission: Yes     Pressure Injury 05/08/20 Heel Right Unstageable - Full thickness tissue loss in which the base of the injury is covered by slough (yellow,  tan, gray, green or brown) and/or eschar (tan, brown or black) in the wound bed. (Active)  05/08/20 1718  Location: Heel  Location Orientation: Right  Staging: Unstageable - Full thickness tissue loss in which the base of the injury is covered by slough (yellow, tan, gray, green or brown) and/or eschar (tan, brown or black) in the wound bed.  Wound Description (Comments):   Present on Admission: Yes    Physical Exam: Vital Signs Blood pressure (!) 174/95, pulse 87, temperature 98.6 F (37 C), temperature source Oral, resp. rate 18,  height '6\' 6"'$  (1.981 m), weight (!) 158.3 kg, SpO2 99 %.   Physical Exam Gen: pt awake, but kept eyes closed; moaning until woke up fully, PT in room, NAD, laying supine in bed HEENT: oral mucosa pink and moist, NCAT Cardio: RRR Chest: CTA B/L- no W/R/R- good air movement [id number], NT, somewhat distended vs protuberant, hypoactive BS Ext: no edema Psych: cordial , but very flat Skin:    Comments: Sacral decubitus as well as bilateral heel dressings are in place change routinely per wound care nurse  Neurological: more Oriented    Comments: Patient is alert in no acute distress. 5/5 strength throughout.     Assessment/Plan: 1. Functional deficits which require 3+ hours per day of interdisciplinary therapy in a comprehensive inpatient rehab setting.  Physiatrist is providing close team supervision and 24 hour management of active medical problems listed below.  Physiatrist and rehab team continue to assess barriers to discharge/monitor patient progress toward functional and medical goals  Care Tool:  Bathing    Body parts bathed by patient: Right arm,Left arm,Chest,Abdomen,Front perineal area,Right upper leg,Left upper leg,Face   Body parts bathed by helper: Right lower leg,Left lower leg,Buttocks     Bathing assist Assist Level: Moderate Assistance - Patient 50 - 74%     Upper Body Dressing/Undressing Upper body dressing   What is the patient wearing?: Pull over shirt    Upper body assist Assist Level: Moderate Assistance - Patient 50 - 74%    Lower Body Dressing/Undressing Lower body dressing      What is the patient wearing?: Pants,Incontinence brief     Lower body assist Assist for lower body dressing: Maximal Assistance - Patient 25 - 49%     Toileting Toileting    Toileting assist Assist for toileting: Maximal Assistance - Patient 25 - 49% Assistive Device Comment:  (urinal)   Transfers Chair/bed transfer  Transfers assist  Chair/bed transfer  activity did not occur: Safety/medical concerns (Per nursing report)  Chair/bed transfer assist level: Moderate Assistance - Patient 50 - 74% (slide board)     Locomotion Ambulation   Ambulation assist   Ambulation activity did not occur: Safety/medical concerns (fatigue, lightheadedness, generalized weakness)          Walk 10 feet activity   Assist  Walk 10 feet activity did not occur: Safety/medical concerns (fatigue, lightheadedness, generalized weakness)        Walk 50 feet activity   Assist Walk 50 feet with 2 turns activity did not occur: Safety/medical concerns (fatigue, lightheadedness, generalized weakness)         Walk 150 feet activity   Assist Walk 150 feet activity did not occur: Safety/medical concerns (fatigue, lightheadedness, generalized weakness)         Walk 10 feet on uneven surface  activity   Assist Walk 10 feet on uneven surfaces activity did not occur: Safety/medical concerns (fatigue, lightheadedness, generalized weakness)  Wheelchair     Assist Will patient use wheelchair at discharge?: Yes Type of Wheelchair: Manual    Wheelchair assist level: Supervision/Verbal cueing Max wheelchair distance: 52'    Wheelchair 50 feet with 2 turns activity    Assist        Assist Level: Supervision/Verbal cueing   Wheelchair 150 feet activity     Assist      Assist Level: Minimal Assistance - Patient > 75%   Blood pressure (!) 174/95, pulse 87, temperature 98.6 F (37 C), temperature source Oral, resp. rate 18, height '6\' 6"'$  (1.981 m), weight (!) 158.3 kg, SpO2 99 %.  Medical Problem List and Plan: 1.  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   1/20- will likely miss a lot of therapy today due to nausea/vomiting  1/22- getting PT eval for vertigo Sx's. Pending results             -ELOS/Goals:  MinA 2-3 weeks. 2.  Antithrombotics: -DVT/anticoagulation: Subcutaneous heparin.  Check vascular study             -antiplatelet therapy: N/A 3. Pain Management: Tylenol as needed  1/22- pt reports pain controlled- con't regimen 4. Mood: Provide emotional support             -antipsychotic agents: N/A 5. Neuropsych: This patient is? capable of making decisions on his own behalf.  1/17- pt appeared slightly confused but amiable this AM- will monitor  1/20- more with it this AM 6. Skin/Wound Care: Routine skin checks 7. Fluids/Electrolytes/Nutrition: Routine in and outs with follow-up chemistries 8.  End-stage renal disease.  Hemodialysis per renal services 9.  Sacral decubitus ulcer as well as ischemic ulcers on the left foot fourth toe and fifth metatarsal head and right heel and fifth metatarsal head.  Wound care nurse follow-up.  Patient to see Dr. Sharol Given outpatient.  Continue bilateral PRAFO's. 10.  ID/ coccygeal osteomyelitis.  Continue vancomycin and cefepime through 06/10/2020  1/17- WBC 8.1  1/18- will schedule turning q2 hours to get off wounds- also will make sure on specialty bed  1/20- is on specialty bed 10.  Anemia of chronic disease.  Continue Aranesp. Hgb 8.8 on 1/16, repeat with HD  1/17- no CBC today, only BMP- will try and check with HD on Tuesday.   1/19- doing better based on labs yesterday 11.  Diabetes mellitus.  Hemoglobin A1c 5.6.  Lantus insulin 14 units nightly             1/16: CBGs 108-167: continue current regimen.   1/17- BGs 102-160- con't regimen  1/18- per pharmacy, want to decrease his insulin due to fasting BG of 85- will decrease to 11 units, and wasn't on insulin at home. Based on ESRD, will NEED insulin at home.   1/19- will let nursing know to teach pt- placed Diabetes coordinator consult yesterday- waiting for them to see pt.   1/22- seen by Diabetes coordinator they want me to decrease Lantus to 8 units, however Bgs running 86-120- which is ideal- will  wait to decrease esp with all his wounds-  12.  Morbid obesity.  BMI 38.37.  Dietary follow-up  13. Orthostatic hypotension with HTN  1/17- Sx's so significant, vomited this AM when got up- will try abd binder and ACE wraps for LEs- since due to osteomyelitis, don't think can use TEDs- if need  be, can add Midodrine, but due to ESRD, cannot start Florinef.  1/18- BP laying is 164/101 this AM- might not do well with midodrine?  1/19- since not clear if actually orthostatic, per renal, will order daily orthostatics x7 days.   1/20- BP 177/108 this AM- used to be on Metoprolol and higher dose of  Norvasc- will give 1x time Metoprolol 12.5 mg since BP so high AND pulse is in 80s.  14. Diarrhea with new constipation  1/18- LBM 3 days ago- on imodium 2 mg q4 hours around the clock- will change to q8 hours to see if can get him to have BM.  1/19- wil give Sorbitol 60 ml today since no BM- maintain lower dose of imodium per pt request.  1/20- thinks sorbitol made him sick- it's actually the ileus that made him sick- refusing NG tube- will give Suppository per pt request (KUB shows likely ileus) and changed diet to clear liquid diet for now.   1/22- will try Sorbitol again- since cannot do Mg citrate and last BM 2 days ago- was small.    LOS: 6 days A FACE TO FACE EVALUATION WAS PERFORMED  Tametha Banning 06/12/2020, 12:24 PM

## 2020-06-13 ENCOUNTER — Inpatient Hospital Stay (HOSPITAL_COMMUNITY): Payer: Medicare Other | Admitting: Occupational Therapy

## 2020-06-13 LAB — GLUCOSE, CAPILLARY
Glucose-Capillary: 82 mg/dL (ref 70–99)
Glucose-Capillary: 115 mg/dL — ABNORMAL HIGH (ref 70–99)
Glucose-Capillary: 188 mg/dL — ABNORMAL HIGH (ref 70–99)
Glucose-Capillary: 117 mg/dL — ABNORMAL HIGH (ref 70–99)

## 2020-06-13 MED ORDER — CHLORHEXIDINE GLUCONATE CLOTH 2 % EX PADS
6.0000 | MEDICATED_PAD | Freq: Two times a day (BID) | CUTANEOUS | Status: DC
  Administered 2020-06-13 – 2020-06-21 (×13): 6 via TOPICAL

## 2020-06-13 MED ORDER — POLYETHYLENE GLYCOL 3350 17 G PO PACK
17.0000 g | PACK | Freq: Every day | ORAL | Status: DC
  Administered 2020-06-13 – 2020-06-14 (×2): 17 g via ORAL
  Filled 2020-06-13 (×2): qty 1

## 2020-06-13 NOTE — Progress Notes (Signed)
[facility] [facility] PHYSICAL MEDICINE & REHABILITATION PROGRESS NOTE   Subjective/Complaints:   Per therapy note, did vertigo testing- no nystagmus- might be that he's so used to very high Bps, that a normal BP is too "low" for him.   Said dizziness slightly better with therapy yesterday- still having Dizziness/nausea though when gets up.   Gassy, but hasn't pooped-  Pt willing to try soap suds enema to see if can get him to get cleaned out.    ROS:  Pt denies SOB, abd pain, CP, and vision changes   Objective:   No results found. Recent Labs    06/10/20 1440 06/12/20 1400  WBC 9.6 5.9  HGB 9.6* 9.7*  HCT 31.7* 31.1*  PLT 276 221   Recent Labs    06/10/20 1440 06/12/20 1400  NA 135 136  K 4.6 3.9  CL 96* 99  CO2 27 28  GLUCOSE 112* 113*  BUN 40* 24*  CREATININE 6.80* 5.19*  CALCIUM 8.8* 8.5*    Intake/Output Summary (Last 24 hours) at 06/13/2020 1255 Last data filed at 06/13/2020 0740 Gross per 24 hour  Intake 590 ml  Output 2012 ml  Net -1422 ml     Pressure Injury 05/01/20 Buttocks Left Stage 4 - Full thickness tissue loss with exposed bone, tendon or muscle. packed with santyl gauze, dressed by WOCN, (Active)  05/01/20 0230  Location: Buttocks  Location Orientation: Left  Staging: Stage 4 - Full thickness tissue loss with exposed bone, tendon or muscle.  Wound Description (Comments): packed with santyl gauze, dressed by WOCN,  Present on Admission: Yes     Pressure Injury 05/01/20 Ankle Left;Lateral Unstageable - Full thickness tissue loss in which the base of the injury is covered by slough (yellow, tan, gray, green or brown) and/or eschar (tan, brown or black) in the wound bed. smal circular wound on the (Active)  05/01/20 0230  Location: Ankle  Location Orientation: Left;Lateral  Staging: Unstageable - Full thickness tissue loss in which the base of the injury is covered by slough (yellow, tan, gray, green or brown) and/or eschar (tan, brown or black) in the  wound bed.  Wound Description (Comments): smal circular wound on the bone  Present on Admission: Yes     Pressure Injury 05/01/20 Foot Left;Posterior Unstageable - Full thickness tissue loss in which the base of the injury is covered by slough (yellow, tan, gray, green or brown) and/or eschar (tan, brown or black) in the wound bed. 4th and 5th toes appear n (Active)  05/01/20 0230  Location: Foot (4th and 5th toes and lateral/anterior side of the sole)  Location Orientation: Left;Posterior  Staging: Unstageable - Full thickness tissue loss in which the base of the injury is covered by slough (yellow, tan, gray, green or brown) and/or eschar (tan, brown or black) in the wound bed.  Wound Description (Comments): 4th and 5th toes appear necrotic circular wound on sole  Present on Admission: Yes     Pressure Injury 05/01/20 Foot Right;Medial;Posterior Unstageable - Full thickness tissue loss in which the base of the injury is covered by slough (yellow, tan, gray, green or brown) and/or eschar (tan, brown or black) in the wound bed. large circular wo (Active)  05/01/20 0230  Location: Foot  Location Orientation: Right;Medial;Posterior  Staging: Unstageable - Full thickness tissue loss in which the base of the injury is covered by slough (yellow, tan, gray, green or brown) and/or eschar (tan, brown or black) in the wound bed.  Wound Description (  Comments): large circular wound on sole  and heel  Present on Admission: Yes     Pressure Injury 05/08/20 Heel Right Unstageable - Full thickness tissue loss in which the base of the injury is covered by slough (yellow, tan, gray, green or brown) and/or eschar (tan, brown or black) in the wound bed. (Active)  05/08/20 1718  Location: Heel  Location Orientation: Right  Staging: Unstageable - Full thickness tissue loss in which the base of the injury is covered by slough (yellow, tan, gray, green or brown) and/or eschar (tan, brown or black) in the wound bed.   Wound Description (Comments):   Present on Admission: Yes    Physical Exam: Vital Signs Blood pressure 126/73, pulse 91, temperature 98.2 F (36.8 C), resp. rate 18, height '6\' 6"'$  (1.981 m), weight (!) 155.6 kg, SpO2 99 %.   Physical Exam Gen: pt awake, kept eyes closed, irritable, NAD HEENT: oral mucosa pink and moist, NCAT Cardio: RRR Chest: CTA B/L- no W/R/R- good air movement Abd: soft, protuberant vs distended; hyperactive- gassy-  Ext: no edema Psych: irritable, but cordial Skin:    Comments: Sacral decubitus as well as bilateral heel dressings are in place change routinely per wound care nurse  Neurological: more Oriented    Comments: Patient is alert in no acute distress. 5/5 strength throughout.     Assessment/Plan: 1. Functional deficits which require 3+ hours per day of interdisciplinary therapy in a comprehensive inpatient rehab setting.  Physiatrist is providing close team supervision and 24 hour management of active medical problems listed below.  Physiatrist and rehab team continue to assess barriers to discharge/monitor patient progress toward functional and medical goals  Care Tool:  Bathing    Body parts bathed by patient: Right arm,Left arm,Chest,Abdomen,Front perineal area,Right upper leg,Left upper leg,Face   Body parts bathed by helper: Right lower leg,Left lower leg,Buttocks     Bathing assist Assist Level: Moderate Assistance - Patient 50 - 74%     Upper Body Dressing/Undressing Upper body dressing   What is the patient wearing?: Pull over shirt    Upper body assist Assist Level: Moderate Assistance - Patient 50 - 74%    Lower Body Dressing/Undressing Lower body dressing      What is the patient wearing?: Pants,Incontinence brief     Lower body assist Assist for lower body dressing: Maximal Assistance - Patient 25 - 49%     Toileting Toileting    Toileting assist Assist for toileting: Maximal Assistance - Patient 25 -  49% Assistive Device Comment:  (urinal)   Transfers Chair/bed transfer  Transfers assist  Chair/bed transfer activity did not occur: Safety/medical concerns (Per nursing report)  Chair/bed transfer assist level: Moderate Assistance - Patient 50 - 74% (slide board)     Locomotion Ambulation   Ambulation assist   Ambulation activity did not occur: Safety/medical concerns (fatigue, lightheadedness, generalized weakness)          Walk 10 feet activity   Assist  Walk 10 feet activity did not occur: Safety/medical concerns (fatigue, lightheadedness, generalized weakness)        Walk 50 feet activity   Assist Walk 50 feet with 2 turns activity did not occur: Safety/medical concerns (fatigue, lightheadedness, generalized weakness)         Walk 150 feet activity   Assist Walk 150 feet activity did not occur: Safety/medical concerns (fatigue, lightheadedness, generalized weakness)         Walk 10 feet on uneven surface  activity  Assist Walk 10 feet on uneven surfaces activity did not occur: Safety/medical concerns (fatigue, lightheadedness, generalized weakness)         Wheelchair     Assist Will patient use wheelchair at discharge?: Yes Type of Wheelchair: Manual    Wheelchair assist level: Supervision/Verbal cueing Max wheelchair distance: 55'    Wheelchair 50 feet with 2 turns activity    Assist        Assist Level: Supervision/Verbal cueing   Wheelchair 150 feet activity     Assist      Assist Level: Minimal Assistance - Patient > 75%   Blood pressure 126/73, pulse 91, temperature 98.2 F (36.8 C), resp. rate 18, height '6\' 6"'$  (1.981 m), weight (!) 155.6 kg, SpO2 99 %.  Medical Problem List and Plan: 1.  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   1/20- will likely miss a lot of therapy today due to  nausea/vomiting  1/22- getting PT eval for vertigo Sx's. Pending results  1/23- no nystagmus with vertigo eval             -ELOS/Goals: MinA 2-3 weeks. 2.  Antithrombotics: -DVT/anticoagulation: Subcutaneous heparin.  Check vascular study             -antiplatelet therapy: N/A 3. Pain Management: Tylenol as needed  1/22- pt reports pain controlled- con't regimen 4. Mood: Provide emotional support             -antipsychotic agents: N/A 5. Neuropsych: This patient is? capable of making decisions on his own behalf.  1/17- pt appeared slightly confused but amiable this AM- will monitor  1/20- more with it this AM 6. Skin/Wound Care: Routine skin checks 7. Fluids/Electrolytes/Nutrition: Routine in and outs with follow-up chemistries 8.  End-stage renal disease.  Hemodialysis per renal services 9.  Sacral decubitus ulcer as well as ischemic ulcers on the left foot fourth toe and fifth metatarsal head and right heel and fifth metatarsal head.  Wound care nurse follow-up.  Patient to see Dr. Sharol Given outpatient.  Continue bilateral PRAFO's. 10.  ID/ coccygeal osteomyelitis.  Continue vancomycin and cefepime through 06/10/2020  1/17- WBC 8.1  1/18- will schedule turning q2 hours to get off wounds- also will make sure on specialty bed  1/20- is on specialty bed 10.  Anemia of chronic disease.  Continue Aranesp. Hgb 8.8 on 1/16, repeat with HD  1/17- no CBC today, only BMP- will try and check with HD on Tuesday.   1/19- doing better based on labs yesterday 11.  Diabetes mellitus.  Hemoglobin A1c 5.6.  Lantus insulin 14 units nightly             1/16: CBGs 108-167: continue current regimen.   1/17- BGs 102-160- con't regimen  1/18- per pharmacy, want to decrease his insulin due to fasting BG of 85- will decrease to 11 units, and wasn't on insulin at home. Based on ESRD, will NEED insulin at home.   1/19- will let nursing know to teach pt- placed Diabetes coordinator consult yesterday- waiting for them to  see pt.   1/22- seen by Diabetes coordinator they want me to decrease Lantus to 8 units, however Bgs running 86-120- which is ideal- will wait to decrease esp with all his wounds-   1/23- BGs 82=151- con't regimen- well controlled 12.  Morbid obesity.  BMI 38.37.  Dietary follow-up  13. Orthostatic hypotension with HTN  1/17- Sx's so significant, vomited this AM when got up- will try abd binder and ACE wraps for LEs- since due to osteomyelitis, don't think can use TEDs- if need be, can add Midodrine, but due to ESRD, cannot start Florinef.  1/18- BP laying is 164/101 this AM- might not do well with midodrine?  1/19- since not clear if actually orthostatic, per renal, will order daily orthostatics x7 days.   1/20- BP 177/108 this AM- used to be on Metoprolol and higher dose of  Norvasc- will give 1x time Metoprolol 12.5 mg since BP so high AND pulse is in 80s  1/23- stating to think he's so used to really high BP, so when comes down to more normal level, he's hypotensive. .  14. Diarrhea with new constipation  1/18- LBM 3 days ago- on imodium 2 mg q4 hours around the clock- will change to q8 hours to see if can get him to have BM.  1/19- wil give Sorbitol 60 ml today since no BM- maintain lower dose of imodium per pt request.  1/20- thinks sorbitol made him sick- it's actually the ileus that made him sick- refusing NG tube- will give Suppository per pt request (KUB shows likely ileus) and changed diet to clear liquid diet for now.   1/22- will try Sorbitol again- since cannot do Mg citrate and last BM 2 days ago- was small.   1/23- still no BM_ will try soap suds enema. And add Miralax back.     LOS: 7 days A FACE TO FACE EVALUATION WAS PERFORMED  Megan Lovorn 06/13/2020, 12:55 PM

## 2020-06-13 NOTE — Progress Notes (Signed)
Pt refused soap suds enema at this time.  Sheela Stack, LPN

## 2020-06-13 NOTE — Progress Notes (Signed)
  [facility] Progress Note   Subjective:  Seen in room. Says didn't sleep well last night, tossed and turned. No CP or dyspnea. S/p HD yesterday - did fine, net UF 2L.  Objective Vitals:   06/12/20 1732 06/12/20 1910 06/13/20 0419 06/13/20 0440  BP: (!) 169/99 (!) 161/98  126/73  Pulse: 92 89  91  Resp: '16 18  18  '$ Temp: 98.4 F (36.9 C) 98.9 F (37.2 C)  98.2 F (36.8 C)  TempSrc:      SpO2: 100% 99%  99%  Weight:   (!) 155.6 kg   Height:       Physical Exam General: Well appearing man, NAD. Room air. Heart: RRR; no murmur Lungs: CTA anteriorly Abdomen: soft, non-tender Extremities: No LE edema Dialysis Access: [facility]  Additional Objective Labs: Basic Metabolic Panel: Recent Labs  Lab 06/08/20 1330 06/10/20 1440 06/12/20 1400  NA 130* 135 136  K 5.2* 4.6 3.9  CL 95* 96* 99  CO2 '25 27 28  '$ GLUCOSE 100* 112* 113*  BUN 55* 40* 24*  CREATININE 7.02* 6.80* 5.19*  CALCIUM 8.8* 8.8* 8.5*  PHOS 7.0* 6.7* 4.7*   Liver Function Tests: Recent Labs  Lab 06/08/20 1330 06/10/20 1440 06/12/20 1400  ALBUMIN 2.0* 2.0* 1.9*   CBC: Recent Labs  Lab 06/08/20 1330 06/10/20 1440 06/12/20 1400  WBC 8.7 9.6 5.9  HGB 9.4* 9.6* 9.7*  HCT 31.1* 31.7* 31.1*  MCV 86.6 86.1 85.2  PLT 257 276 221   Medications:  . (feeding supplement) PROSource Plus  30 mL Oral TID BM  . amLODipine  5 mg Oral QHS  . Chlorhexidine Gluconate Cloth  6 each Topical BID  . [START ON 06/15/2020] darbepoetin (ARANESP) injection - DIALYSIS  200 mcg Intravenous Q Tue-HD  . famotidine  20 mg Oral QHS  . feeding supplement (NEPRO CARB STEADY)  237 mL Oral BID BM  . heparin injection (subcutaneous)  7,500 Units Subcutaneous Q8H  . insulin aspart  0-9 Units Subcutaneous TID WC  . insulin glargine  11 Units Subcutaneous QHS  . lanthanum  500 mg Oral TID WC  . metoCLOPramide  5 mg Oral TID AC  . multivitamin  1 tablet Oral QHS    Dialysis Orders: NWTTS 4h 400/800 143.5kg 3K/2.5Ca  TDCHeparin3000units IV TIW - Mircera178mgIVq2wks - last 12/8 - Venofer '100mg'$ IVqHD x10 - completed 6/10 doses  Problem/Plan: 1. Sacral decub/coccygealosteo: S/p 6 weeks of Vanc/Cefepime (finished 06/10/20) - ID following.Continues hydrotherapy. In CIR. 2. 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 3. ESRD: Back on usual TTS schedule - next HD 1/25. 4. Diarrhea -Recurrent issue, thought to be d/t Renvela which has now been stopped. Follow. 5. BP/volume:BPimproving.Previously havingissues with hypotension which improved once amlodipine and metoprolol d/c'd. Keep SBP >100 on HD.Has had problems withorthostatic hypotension.  6.  COVID PNA- tested positive on 11/30, is off isolation now.  7. Anemiaof CKD: Hgb 9.7, cont Aranesp 2052mqSat 8. Secondary Hyperparathyroidism:CorrCa ok, Phos high.Not on VDRA.Stoppedrenvela 2/2diarrhea, phos now elevated again, trying Fosrenol instead. 9. Nutrition: Alb remains low, continue Nepro. 10. DMT2 - per admit   KaVeneta PentonPA-C 06/13/2020, 11:21 AM  [facility]

## 2020-06-13 NOTE — Progress Notes (Signed)
No BM in 3 days. Rectal suppository given.  Sheela Stack, LPN

## 2020-06-13 NOTE — Progress Notes (Signed)
Occupational Therapy Session Note  [name] MRN: [id number] Date of Birth: 13-Feb-1971  Today's Date: 06/13/2020 OT Individual Time: 1300-1340 OT Individual Time Calculation (min): 40 min    Short Term Goals: Week 1:  OT Short Term Goal 1 (Week 1): patient will completed bed mobility with CS OT Short Term Goal 2 (Week 1): patient will complete sit pivot transfers with min A OT Short Term Goal 3 (Week 1): patient will complete lower body bathing and dressing with min A and use of assistive devices as needed OT Short Term Goal 4 (Week 1): patient will tolerate sitting out of bed for 3-4 hours     Skilled Therapeutic Interventions/Progress Updates:    Pt greeted in bed with no c/o pain, just "head swimming" at rest and when sitting up. Throughout session pt visibly minimizing head movement and often keeping his eyes closed. OT first wrapped B LEs with ACE wraps, once he transitioned EOB with setup assist abdominal binder applied. Guided pt through gentle UB stretches including forward/backward shoulder rolls, shoulder shrugs, scapular pinches, shoulder retraction with combined elbow extension 10 reps 1 set. He reports muscular tightness in his neck. Discussed importance of gentle stretching in bed to improve this. Pt then completed UE exercises using the 5# bar x10-15 reps 1 set each exercise with instruction on technique. Pt returned to bed and compression garments were removed as pt reports being hypertensive at rest. Left him in care of RN.   Therapy Documentation Precautions:  Precautions Precautions: Fall Precaution Comments: wounds on bilat feet and L sacrum, monitor orthostatic BPs Required Braces or Orthoses: Other Brace Other Brace: abdominal binder Restrictions Weight Bearing Restrictions: No Vital Signs: Therapy Vitals Temp: 98.3 F (36.8 C) Pulse Rate: 95 Resp: 17 BP: 139/83 Patient Position (if appropriate): Lying Oxygen Therapy SpO2: 100 % O2  Device: Room Air ADL: ADL Eating: Set up,Supervision/safety Where Assessed-Eating: Bed level Grooming: Supervision/safety,Setup Where Assessed-Grooming: Sitting at sink,Wheelchair Upper Body Bathing: Minimal assistance Where Assessed-Upper Body Bathing: Wheelchair,Sitting at sink Lower Body Bathing: Moderate assistance Where Assessed-Lower Body Bathing: Bed level Upper Body Dressing: Setup,Supervision/safety Where Assessed-Upper Body Dressing: Sitting at sink,Wheelchair Lower Body Dressing: Maximal assistance Where Assessed-Lower Body Dressing: Bed level     Therapy/Group: Individual Therapy  Ellyanna Holton A Loxley Cibrian 06/13/2020, 3:46 PM

## 2020-06-14 ENCOUNTER — Inpatient Hospital Stay (HOSPITAL_COMMUNITY): Payer: Medicare Other | Admitting: Occupational Therapy

## 2020-06-14 ENCOUNTER — Inpatient Hospital Stay (HOSPITAL_COMMUNITY): Payer: Medicare Other | Admitting: Physical Therapy

## 2020-06-14 LAB — GLUCOSE, CAPILLARY
Glucose-Capillary: 85 mg/dL (ref 70–99)
Glucose-Capillary: 143 mg/dL — ABNORMAL HIGH (ref 70–99)
Glucose-Capillary: 110 mg/dL — ABNORMAL HIGH (ref 70–99)
Glucose-Capillary: 170 mg/dL — ABNORMAL HIGH (ref 70–99)

## 2020-06-14 MED ORDER — SENNOSIDES-DOCUSATE SODIUM 8.6-50 MG PO TABS
2.0000 | ORAL_TABLET | Freq: Two times a day (BID) | ORAL | Status: DC
  Administered 2020-06-14 – 2020-06-18 (×7): 2 via ORAL
  Filled 2020-06-14 (×15): qty 2

## 2020-06-14 MED ORDER — CHLORHEXIDINE GLUCONATE CLOTH 2 % EX PADS: 6.0000 | MEDICATED_PAD | Freq: Every day | CUTANEOUS | Status: DC

## 2020-06-14 NOTE — Progress Notes (Addendum)
Pt c/o dizziness with therapy, assessment, vitals, and CBG obtained. No changes from baseline. See Vitals. Pt vitals indicate HTN followed by normal pressures. Pt still c/o dizziness. Pt transferred back to bed. Meds given. No complications noted.   Sheela Stack, LPN

## 2020-06-14 NOTE — Progress Notes (Signed)
[facility] PHYSICAL MEDICINE & REHABILITATION PROGRESS NOTE   Subjective/Complaints: Had small BM last noc but still feels constipated, no abd pain  ROS:  Pt denies SOB, abd pain, CP, and vision changes   Objective:   No results found. Recent Labs    06/12/20 1400  WBC 5.9  HGB 9.7*  HCT 31.1*  PLT 221   Recent Labs    06/12/20 1400  NA 136  K 3.9  CL 99  CO2 28  GLUCOSE 113*  BUN 24*  CREATININE 5.19*  CALCIUM 8.5*    Intake/Output Summary (Last 24 hours) at 06/14/2020 0825 Last data filed at 06/14/2020 0744 Gross per 24 hour  Intake 797 ml  Output -  Net 797 ml     Pressure Injury 05/01/20 Buttocks Left Stage 4 - Full thickness tissue loss with exposed bone, tendon or muscle. packed with santyl gauze, dressed by WOCN, (Active)  05/01/20 0230  Location: Buttocks  Location Orientation: Left  Staging: Stage 4 - Full thickness tissue loss with exposed bone, tendon or muscle.  Wound Description (Comments): packed with santyl gauze, dressed by WOCN,  Present on Admission: Yes     Pressure Injury 05/01/20 Ankle Left;Lateral Unstageable - Full thickness tissue loss in which the base of the injury is covered by slough (yellow, tan, gray, green or brown) and/or eschar (tan, brown or black) in the wound bed. smal circular wound on the (Active)  05/01/20 0230  Location: Ankle  Location Orientation: Left;Lateral  Staging: Unstageable - Full thickness tissue loss in which the base of the injury is covered by slough (yellow, tan, gray, green or brown) and/or eschar (tan, brown or black) in the wound bed.  Wound Description (Comments): smal circular wound on the bone  Present on Admission: Yes     Pressure Injury 05/01/20 Foot Left;Posterior Unstageable - Full thickness tissue loss in which the base of the injury is covered by slough (yellow, tan, gray, green or brown) and/or eschar (tan, brown or black) in the wound bed. 4th and 5th toes appear n (Active)  05/01/20 0230   Location: Foot (4th and 5th toes and lateral/anterior side of the sole)  Location Orientation: Left;Posterior  Staging: Unstageable - Full thickness tissue loss in which the base of the injury is covered by slough (yellow, tan, gray, green or brown) and/or eschar (tan, brown or black) in the wound bed.  Wound Description (Comments): 4th and 5th toes appear necrotic circular wound on sole  Present on Admission: Yes     Pressure Injury 05/01/20 Foot Right;Medial;Posterior Unstageable - Full thickness tissue loss in which the base of the injury is covered by slough (yellow, tan, gray, green or brown) and/or eschar (tan, brown or black) in the wound bed. large circular wo (Active)  05/01/20 0230  Location: Foot  Location Orientation: Right;Medial;Posterior  Staging: Unstageable - Full thickness tissue loss in which the base of the injury is covered by slough (yellow, tan, gray, green or brown) and/or eschar (tan, brown or black) in the wound bed.  Wound Description (Comments): large circular wound on sole  and heel  Present on Admission: Yes     Pressure Injury 05/08/20 Heel Right Unstageable - Full thickness tissue loss in which the base of the injury is covered by slough (yellow, tan, gray, green or brown) and/or eschar (tan, brown or black) in the wound bed. (Active)  05/08/20 1718  Location: Heel  Location Orientation: Right  Staging: Unstageable - Full thickness tissue loss in  which the base of the injury is covered by slough (yellow, tan, gray, green or brown) and/or eschar (tan, brown or black) in the wound bed.  Wound Description (Comments):   Present on Admission: Yes    Physical Exam: Vital Signs Blood pressure (!) 146/92, pulse 91, temperature 98.4 F (36.9 C), resp. rate 18, height '6\' 6"'$  (1.981 m), weight (!) 155.3 kg, SpO2 99 %.   Physical Exam  General: No acute distress Mood and affect are appropriate Heart: Regular rate and rhythm no rubs murmurs or extra sounds Lungs:  Clear to auscultation, breathing unlabored, no rales or wheezes Abdomen: Positive bowel sounds, soft nontender to palpation, nondistended Extremities: No clubbing, cyanosis, or edema Skin: No evidence of breakdown, no evidence of rash   Skin:    Comments: Sacral decubitus as well as bilateral heel dressings are in place change routinely per wound care nurse  Neurological: more Oriented    Comments: Patient is alert in no acute distress. 5/5 strength throughout.     Assessment/Plan: 1. Functional deficits which require 3+ hours per day of interdisciplinary therapy in a comprehensive inpatient rehab setting.  Physiatrist is providing close team supervision and 24 hour management of active medical problems listed below.  Physiatrist and rehab team continue to assess barriers to discharge/monitor patient progress toward functional and medical goals  Care Tool:  Bathing    Body parts bathed by patient: Right arm,Left arm,Chest,Abdomen,Front perineal area,Right upper leg,Left upper leg,Face   Body parts bathed by helper: Right lower leg,Left lower leg,Buttocks     Bathing assist Assist Level: Moderate Assistance - Patient 50 - 74%     Upper Body Dressing/Undressing Upper body dressing   What is the patient wearing?: Pull over shirt    Upper body assist Assist Level: Moderate Assistance - Patient 50 - 74%    Lower Body Dressing/Undressing Lower body dressing      What is the patient wearing?: Pants,Incontinence brief     Lower body assist Assist for lower body dressing: Maximal Assistance - Patient 25 - 49%     Toileting Toileting    Toileting assist Assist for toileting: Maximal Assistance - Patient 25 - 49% Assistive Device Comment:  (urinal)   Transfers Chair/bed transfer  Transfers assist  Chair/bed transfer activity did not occur: Safety/medical concerns (Per nursing report)  Chair/bed transfer assist level: Moderate Assistance - Patient 50 - 74% (slide  board)     Locomotion Ambulation   Ambulation assist   Ambulation activity did not occur: Safety/medical concerns (fatigue, lightheadedness, generalized weakness)          Walk 10 feet activity   Assist  Walk 10 feet activity did not occur: Safety/medical concerns (fatigue, lightheadedness, generalized weakness)        Walk 50 feet activity   Assist Walk 50 feet with 2 turns activity did not occur: Safety/medical concerns (fatigue, lightheadedness, generalized weakness)         Walk 150 feet activity   Assist Walk 150 feet activity did not occur: Safety/medical concerns (fatigue, lightheadedness, generalized weakness)         Walk 10 feet on uneven surface  activity   Assist Walk 10 feet on uneven surfaces activity did not occur: Safety/medical concerns (fatigue, lightheadedness, generalized weakness)         Wheelchair     Assist Will patient use wheelchair at discharge?: Yes Type of Wheelchair: Manual    Wheelchair assist level: Supervision/Verbal cueing Max wheelchair distance: 74'  Wheelchair 50 feet with 2 turns activity    Assist        Assist Level: Supervision/Verbal cueing   Wheelchair 150 feet activity     Assist      Assist Level: Minimal Assistance - Patient > 75%   Blood pressure (!) 146/92, pulse 91, temperature 98.4 F (36.9 C), resp. rate 18, height '6\' 6"'$  (1.981 m), weight (!) 155.3 kg, SpO2 99 %.  Medical Problem List and Plan: 1.  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 2.  Antithrombotics: -DVT/anticoagulation: Subcutaneous heparin.  Check vascular study             -antiplatelet therapy: N/A 3. Pain Management: Tylenol as needed  1/22- pt reports pain controlled- con't regimen 4. Mood: Provide emotional support             -antipsychotic agents: N/A 5. Neuropsych: This patient is? capable  of making decisions on his own behalf.  1/17- pt appeared slightly confused but amiable this AM- will monitor  1/20- more with it this AM 6. Skin/Wound Care: Routine skin checks 7. Fluids/Electrolytes/Nutrition: Routine in and outs with follow-up chemistries 8.  End-stage renal disease.  Hemodialysis per renal services 9.  Sacral decubitus ulcer as well as ischemic ulcers on the left foot fourth toe and fifth metatarsal head and right heel and fifth metatarsal head.  Wound care nurse follow-up.  Patient to see Dr. Sharol Given outpatient.  Continue bilateral PRAFO's. 10.  ID/ coccygeal osteomyelitis.  Continue vancomycin and cefepime through 06/10/2020  1/17- WBC 8.1  1/18- will schedule turning q2 hours to get off wounds- also will make sure on specialty bed  1/20- is on specialty bed 10.  Anemia of chronic disease.  Continue Aranesp. Hgb 8.8 on 1/16, repeat with HD  1/17- no CBC today, only BMP- will try and check with HD on Tuesday.   1/19- doing better based on labs yesterday 11.  Diabetes mellitus.  Hemoglobin A1c 5.6.  Lantus insulin 14 units nightly             1/16: CBGs 108-167: continue current regimen.   1/17- BGs 102-160- con't regimen  1/18- per pharmacy, want to decrease his insulin due to fasting BG of 85- will decrease to 11 units, and wasn't on insulin at home. Based on ESRD, will NEED insulin at home.   1/19- will let nursing know to teach pt- placed Diabetes coordinator consult yesterday- waiting for them to see pt.   1/22- seen by Diabetes coordinator they want me to decrease Lantus to 8 units, however Bgs running 86-120- which is ideal- will wait to decrease esp with all his wounds-  CBG (last 3)  Recent Labs    06/13/20 1645 06/13/20 2058 06/14/20 0557  GLUCAP 188* 117* 85  controlled 1/24  12.  Morbid obesity.  BMI 38.37.  Dietary follow-up  13. Orthostatic hypotension with HTN  1/17- Sx's so significant, vomited this AM when got up- will try abd binder and ACE wraps for  LEs- since due to osteomyelitis, don't think can use TEDs- if need be, can add Midodrine, but due to ESRD, cannot start Florinef.  1/18- BP laying is 164/101 this AM- might not do well with midodrine?  1/19- since not clear if actually orthostatic, per renal, will order daily orthostatics x7 days.   1/20- BP 177/108  this AM- used to be on Metoprolol and higher dose of  Norvasc- will give 1x time Metoprolol 12.5 mg since BP so high AND pulse is in 80s  1/23- stating to think he's so used to really high BP, so when comes down to more normal level, he's hypotensive. .  14. Diarrhea with new constipation  1/18- LBM 3 days ago- on imodium 2 mg q4 hours around the clock- will change to q8 hours to see if can get him to have BM.  1/19- wil give Sorbitol 60 ml today since no BM- maintain lower dose of imodium per pt request.  1/20- thinks sorbitol made him sick- it's actually the ileus that made him sick- refusing NG tube- will give Suppository per pt request (KUB shows likely ileus) and changed diet to clear liquid diet for now.   1/22- will try Sorbitol again- since cannot do Mg citrate and last BM 2 days ago- was small.   1/23- still no BM_ordered soap suds enema Pt refused . And add Miralax back.    Pt states he was on 3 stool softeners on acute care which worked for him  Will start senna S 2 po BID   LOS: 8 days A FACE TO FACE EVALUATION WAS PERFORMED  Charlett Blake 06/14/2020, 8:25 AM

## 2020-06-14 NOTE — Progress Notes (Signed)
[facility] Progress Note   Subjective:   Seen in room, no complaints today. Denies SOB, CP, dizziness, abdominal pain, nausea and vomiting.   Objective Vitals:   06/13/20 1346 06/13/20 1347 06/13/20 1952 06/14/20 0426  BP: (!) 149/99 139/83 (!) 165/108 (!) 146/92  Pulse: 96 95 (!) 102 91  Resp: '17  17 18  '$ Temp: 98.3 F (36.8 C) 98.3 F (36.8 C) 98.5 F (36.9 C) 98.4 F (36.9 C)  TempSrc:      SpO2: 100%  99% 99%  Weight:    (!) 155.3 kg  Height:       Physical Exam General: Well developed male, alert and in NAD Heart: RRR, no murmurs, rubs or gallops Lungs: CTA bilaterally, no wheezing, rhonchi or rales Abdomen: Soft, non-distended, +BS Extremities: no edema bilateral upper and lower extremities Dialysis Access: [facility]   Additional Objective Labs: Basic Metabolic Panel: Recent Labs  Lab 06/08/20 1330 06/10/20 1440 06/12/20 1400  NA 130* 135 136  K 5.2* 4.6 3.9  CL 95* 96* 99  CO2 '25 27 28  '$ GLUCOSE 100* 112* 113*  BUN 55* 40* 24*  CREATININE 7.02* 6.80* 5.19*  CALCIUM 8.8* 8.8* 8.5*  PHOS 7.0* 6.7* 4.7*   Liver Function Tests: Recent Labs  Lab 06/08/20 1330 06/10/20 1440 06/12/20 1400  ALBUMIN 2.0* 2.0* 1.9*   CBC: Recent Labs  Lab 06/08/20 1330 06/10/20 1440 06/12/20 1400  WBC 8.7 9.6 5.9  HGB 9.4* 9.6* 9.7*  HCT 31.1* 31.7* 31.1*  MCV 86.6 86.1 85.2  PLT 257 276 221   Blood Culture    Component Value Date/Time   SDES BLOOD SITE NOT SPECIFIED 04/29/2020 1210   SPECREQUEST  04/29/2020 1210    BOTTLES DRAWN AEROBIC AND ANAEROBIC Blood Culture adequate volume   CULT  04/29/2020 1210    NO GROWTH 5 DAYS Performed at [facility], [address], [place], [place] [address]    REPTSTATUS 05/04/2020 FINAL 04/29/2020 1210   CBG: Recent Labs  Lab 06/13/20 1145 06/13/20 1645 06/13/20 2058 06/14/20 0557 06/14/20 0915  GLUCAP 115* 188* 117* 85 143*   Medications:  . (feeding supplement) PROSource Plus  30 mL Oral  TID BM  . amLODipine  5 mg Oral QHS  . Chlorhexidine Gluconate Cloth  6 each Topical BID  . [START ON 06/15/2020] darbepoetin (ARANESP) injection - DIALYSIS  200 mcg Intravenous Q Tue-HD  . famotidine  20 mg Oral QHS  . feeding supplement (NEPRO CARB STEADY)  237 mL Oral BID BM  . heparin injection (subcutaneous)  7,500 Units Subcutaneous Q8H  . insulin aspart  0-9 Units Subcutaneous TID WC  . insulin glargine  11 Units Subcutaneous QHS  . lanthanum  500 mg Oral TID WC  . metoCLOPramide  5 mg Oral TID AC  . multivitamin  1 tablet Oral QHS  . polyethylene glycol  17 g Oral Daily  . senna-docusate  2 tablet Oral BID    Dialysis Orders:  NWTTS 4h 400/800 143.5kg 3K/2.5Ca TDCHeparin3000units IV TIW - Mircera147mgIVq2wks - last 12/8 - Venofer '100mg'$ IVqHD x10 - completed 6/10 doses   Assessment/Plan: 1. Sacral decub/coccygealosteo:S/p 6 weeks ofVanc/Cefepime (finished1/20/22) - ID following.Continues hydrotherapy. In CIR. 2. 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 3. ESRD:Back on usual TTS schedule - next HD 1/25. 4. Diarrhea -Recurrent issue, thought to be d/t Renvela which has now been stopped. Appears to be resolved now.  5. BP/volume:BPimproving.Previously havingissues with hypotension which improved once  amlodipine and metoprolol d/c'd. Keep SBP >100 on HD.Has had problems withorthostatic hypotension and dizziness. 6. COVID PNA- tested positive on 11/30, is off isolation now.  7. Anemiaof CKD: Hgb 9.7,cont Aranesp 243mgqSat 8. Secondary Hyperparathyroidism:CorrCa10.2, use low Ca bath.Not on VDRA.Stoppedrenvela 2/2diarrhea, tryingFosrenol instead and phos is at goal. 9. Nutrition:Alb remains low, continue Nepro. 10. DMT2 - per admit   SAnice Paganini PA-C 06/14/2020, 11:04 AM  [facility] Pager: [phone]

## 2020-06-14 NOTE — Progress Notes (Signed)
Physical Therapy Weekly Progress Note  Patient Details  Name: Shawne Eskelson MRN: [id number] Date of Birth: 1971/05/14  Beginning of progress report period: June 08, 2019 End of progress report period: June 15, 2019  Today's Date: 06/14/2020 PT Individual Time: 1101-1155 PT Individual Time Calculation (min): 54 min   Patient has met  0 of 3 short term goals.  Pt has shown slow progress toward goals being profoundly limited with nausea, vomiting, dizziness and blood pressure inconsistencies. Pt is very motivated to participate and verbalizes wanting to do more with therapy to progress functionally however medically limited at this time. Pt requires min A for bed mobility intermittently with hospital bed functions, pt has been limiting in completing Sit to stands to improve with these 2/2 frequently unable to leave room with nausea and vomiting and dizziness. Bed<>chair transfers with slide board transfers are not consistently min A and has frequently mod A.  Patient continues to demonstrate the following deficits muscle weakness, decreased cardiorespiratoy endurance and decreased sitting balance, decreased standing balance, decreased postural control and decreased balance strategies and therefore will continue to benefit from skilled PT intervention to increase functional independence with mobility.  Patient progressing toward long term goals..  Continue plan of care.  PT Short Term Goals Week 1:  PT Short Term Goal 1 (Week 1): pt will perform all bed mobility with CGA PT Short Term Goal 1 - Progress (Week 1): Not met PT Short Term Goal 2 (Week 1): Pt will transfer sit<>stand with LRAD and mod A PT Short Term Goal 2 - Progress (Week 1): Not met PT Short Term Goal 3 (Week 1): Pt will transfer bed<>chair with LRAD and min A PT Short Term Goal 3 - Progress (Week 1): Not met Week 2:  PT Short Term Goal 1 (Week 2): Pt will transfer bed<>chair with LRAD and min A PT Short Term Goal 2 (Week 2):  Pt will transfer sit<>stand with LRAD and mod A PT Short Term Goal 3 (Week 2): pt will perform all bed mobility with CGA PT Short Term Goal 4 (Week 2): pt will recall pressure relief schedule and safe techniques with pressure relief PT Short Term Goal 5 (Week 2): pt will tolerate sitting OOB for 45 mins without adverse effects      Skilled Therapeutic Interventions/Progress Updates:    pt received in bed and agreeable to therapy bur reports continued nausea and slight dizziness but reports this is much better than previous days. Pt directed in supine>sit min A for trunk support and sat EOB min A initially but improved to CGA. Pt reported feeling much better and wanted to sit in [facility], pt directed in SB transfer to [facility] to pt's Lt with gait belt in place and emphasis put on "bumping" technique for skin integrity mod A to complete with extra time overall. Pt then required prolonged rest break 2/2 to fatigue and slight dizziness, requested to remain in room due to nausea for remainder of session. BP taken to be 147-103 and pul;se of 106. Pt directed in seated BLE strengthening exercises of marching, LAQ, hip abduction and adduction 2x10 with 2#. Pt educated on pressure relief schedule and techniques in chair for every 15 mins per MD with lateral leans and anterior leans with bed for support with this for safety and pt demonstrated good safety awareness and technique with lateral leans and VC for safety with anterior leans to have bed in front for improved safety. Pt agreed. Pt unable to recall schedule and  reeducated on this. Pt directed in [facility] mobility for 20' min A and fatigued, returned to room total A for time and energy and requested to go to gym to attempt to stand in //. PT requested nursing opinion and pt then reported he felt worse with dizziness and fatigue, BP taken again to be 111/92, abdominal binder donned and pt directed in slide board transfer to return to bed. Slide board transfer to Lt was still mod  A however pt demonstrated greatly improved technique and bumping to achieve overall transfer, mod A sit>supine and total A for doffing abdominal binder. Pt left in supine, All needs in reach and in good condition. Call light in hand.  Reporting feeling better and nursing present for pt assessment.   Therapy Documentation Precautions:  Precautions Precautions: Fall Precaution Comments: wounds on bilat feet and L sacrum, monitor orthostatic BPs Required Braces or Orthoses: Other Brace Other Brace: abdominal binder Restrictions Weight Bearing Restrictions: No General:   Vital Signs: Therapy Vitals Temp: 98.6 F (37 C) Pulse Rate: (!) 109 Resp: 18 BP: (!) 111/92 Patient Position (if appropriate): Sitting Oxygen Therapy SpO2: 100 % O2 Device: Room Air Pain: Pain Assessment Pain Scale: 0-10 Pain Score: 0-No pain Vision/Perception     Mobility:   Locomotion :    Trunk/Postural Assessment :    Balance:   Exercises:   Other Treatments:     Therapy/Group: Individual Therapy  [name] 06/14/2020, 12:20 PM

## 2020-06-14 NOTE — Progress Notes (Signed)
Occupational Therapy Session Note  [name] MRN: [id number] Date of Birth: [birth year]  Today's Date: 06/14/2020 OT Individual Time: 1975-8832 and 5498-2641 OT Individual Time Calculation (min): 70 min and 60 min   Short Term Goals: Week 1:  OT Short Term Goal 1 (Week 1): patient will completed bed mobility with CS OT Short Term Goal 2 (Week 1): patient will complete sit pivot transfers with min A OT Short Term Goal 3 (Week 1): patient will complete lower body bathing and dressing with min A and use of assistive devices as needed OT Short Term Goal 4 (Week 1): patient will tolerate sitting out of bed for 3-4 hours   Skilled Therapeutic Interventions/Progress Updates:    Session 1: Pt greeted at time of session supine in bed resting agreeable to OT session, no pain and reports less dizziness and N/V than last week. Discussed set up for self feeding, pt says he lays in bed semireclined but recommended to sit upright in bed or in chair for best posture and promote safety. Agreeable to ADL, therapist placed ace wraps prior to sitting up and supine > sit Min A. Mild dizziness at 3/10 which improved sitting EOB. Performed UB/LB bathing declining to wash feet and periarea/buttocks today but discussed how pt could do this at bed level and eventually standing. UB dress pull over shirt CGA today, less trunk sway compared to previous sessions. Returned to supine and donned pants Min A at bed level, able to bring feet up to self to thread and roll L/R Supervision to don over hips. Sat EOB and performed BUE strengthening with 5# for 2x15-20 reps for bicep curls, chest press, overhead press with cues for form and posture. Discussion throughout session as well for DC planning home with dad for support, home layout, etc.  Session 2: Pt greeted at time of session semireclined in bed resting agreeable to OT session, no pain. Ace wraps applied to pt in semireclined position. Supine to sit  EOB Min A with bed features. Donned binder with Max A. BP checked sitting EOB and 168/116 and pt with mild dizziness. Sit to stand at [facility] from bed with Max A of 1 and stedy transfer to w/c. BP 141/106 and mild dizziness, no significant change. Assisted self propelling part of way to gym, assist when fatigued. Performed rebounder 2x20 with small blue ball approx 3# with overhead reach and toss. Attempted sit to stand at [facility] from wheelchair but unable d/t low height. Slide board back to bed Mod A overall (with cues for anterior weight shift and preventing any shearing force on sacral wound) after board placement. Call bell in reach all needs met.   Therapy Documentation Precautions:  Precautions Precautions: Fall Precaution Comments: wounds on bilat feet and L sacrum, monitor orthostatic BPs Required Braces or Orthoses: Other Brace Other Brace: abdominal binder Restrictions Weight Bearing Restrictions: No     Therapy/Group: Individual Therapy  [name] 06/14/2020, 7:06 AM

## 2020-06-15 ENCOUNTER — Inpatient Hospital Stay (HOSPITAL_COMMUNITY): Payer: Medicare Other | Admitting: Occupational Therapy

## 2020-06-15 ENCOUNTER — Inpatient Hospital Stay (HOSPITAL_COMMUNITY): Payer: Medicare Other

## 2020-06-15 LAB — CBC
HCT: 32.3 % — ABNORMAL LOW (ref 39.0–52.0)
Hemoglobin: 9.8 g/dL — ABNORMAL LOW (ref 13.0–17.0)
MCH: 26.2 pg (ref 26.0–34.0)
MCHC: 30.3 g/dL (ref 30.0–36.0)
MCV: 86.4 fL (ref 80.0–100.0)
Platelets: 278 10*3/uL (ref 150–400)
RBC: 3.74 MIL/uL — ABNORMAL LOW (ref 4.22–5.81)
RDW: 16.8 % — ABNORMAL HIGH (ref 11.5–15.5)
WBC: 11.9 10*3/uL — ABNORMAL HIGH (ref 4.0–10.5)
nRBC: 0 % (ref 0.0–0.2)

## 2020-06-15 LAB — RENAL FUNCTION PANEL
Albumin: 2.1 g/dL — ABNORMAL LOW (ref 3.5–5.0)
Anion gap: 13 (ref 5–15)
BUN: 60 mg/dL — ABNORMAL HIGH (ref 6–20)
CO2: 25 mmol/L (ref 22–32)
Calcium: 8.9 mg/dL (ref 8.9–10.3)
Chloride: 97 mmol/L — ABNORMAL LOW (ref 98–111)
Creatinine, Ser: 7.38 mg/dL — ABNORMAL HIGH (ref 0.61–1.24)
GFR, Estimated: 8 mL/min — ABNORMAL LOW (ref >60)
Glucose, Bld: 137 mg/dL — ABNORMAL HIGH (ref 70–99)
Phosphorus: 5.6 mg/dL — ABNORMAL HIGH (ref 2.5–4.6)
Potassium: 4.7 mmol/L (ref 3.5–5.1)
Sodium: 135 mmol/L (ref 135–145)

## 2020-06-15 LAB — GLUCOSE, CAPILLARY
Glucose-Capillary: 142 mg/dL — ABNORMAL HIGH (ref 70–99)
Glucose-Capillary: 154 mg/dL — ABNORMAL HIGH (ref 70–99)
Glucose-Capillary: 163 mg/dL — ABNORMAL HIGH (ref 70–99)
Glucose-Capillary: 114 mg/dL — ABNORMAL HIGH (ref 70–99)
Glucose-Capillary: 193 mg/dL — ABNORMAL HIGH (ref 70–99)

## 2020-06-15 MED ORDER — POLYETHYLENE GLYCOL 3350 17 G PO PACK
17.0000 g | PACK | Freq: Every day | ORAL | Status: DC
  Administered 2020-06-17: 17 g via ORAL
  Filled 2020-06-15 (×8): qty 1

## 2020-06-15 MED ORDER — DARBEPOETIN ALFA 200 MCG/0.4ML IJ SOSY
PREFILLED_SYRINGE | INTRAMUSCULAR | Status: AC
  Administered 2020-06-15: 200 ug via INTRAVENOUS
  Filled 2020-06-15: qty 0.4

## 2020-06-15 MED ORDER — HEPARIN SODIUM (PORCINE) 1000 UNIT/ML IJ SOLN
INTRAMUSCULAR | Status: AC
  Filled 2020-06-15: qty 4

## 2020-06-15 NOTE — Progress Notes (Signed)
Bilateral PRAFOS in place.  Sheela Stack, LPN

## 2020-06-15 NOTE — Progress Notes (Signed)
PHYSICAL MEDICINE & REHABILITATION PROGRESS NOTE   Subjective/Complaints:   Had small BM again, but still very constipated.  Didn't really respond to Sorbitol- miralax started daily- pt scared of loose stools.   Still dizzy/nauseated, but rates as 6/10 compared to it being 10-12/10 a few days ago.   Didn't sleep well overnight- but did admit to taking nap yesterday.   Per OT, tried beazy board last week- didn't work- but does bump with cues on transfer board.    ROS:   Pt denies SOB, abd pain, CP, N/V/C/D, and vision changes  Objective:   No results found. Recent Labs    06/12/20 1400  WBC 5.9  HGB 9.7*  HCT 31.1*  PLT 221   Recent Labs    06/12/20 1400  NA 136  K 3.9  CL 99  CO2 28  GLUCOSE 113*  BUN 24*  CREATININE 5.19*  CALCIUM 8.5*    Intake/Output Summary (Last 24 hours) at 06/15/2020 0912 Last data filed at 06/14/2020 1331 Gross per 24 hour  Intake 240 ml  Output 600 ml  Net -360 ml     Pressure Injury 05/01/20 Buttocks Left Stage 4 - Full thickness tissue loss with exposed bone, tendon or muscle. packed with santyl gauze, dressed by WOCN, (Active)  05/01/20 0230  Location: Buttocks  Location Orientation: Left  Staging: Stage 4 - Full thickness tissue loss with exposed bone, tendon or muscle.  Wound Description (Comments): packed with santyl gauze, dressed by WOCN,  Present on Admission: Yes     Pressure Injury 05/01/20 Ankle Left;Lateral Unstageable - Full thickness tissue loss in which the base of the injury is covered by slough (yellow, tan, gray, green or brown) and/or eschar (tan, brown or black) in the wound bed. smal circular wound on the (Active)  05/01/20 0230  Location: Ankle  Location Orientation: Left;Lateral  Staging: Unstageable - Full thickness tissue loss in which the base of the injury is covered by slough (yellow, tan, gray, green or brown) and/or eschar (tan, brown or black) in the wound bed.  Wound Description  (Comments): smal circular wound on the bone  Present on Admission: Yes     Pressure Injury 05/01/20 Foot Left;Posterior Unstageable - Full thickness tissue loss in which the base of the injury is covered by slough (yellow, tan, gray, green or brown) and/or eschar (tan, brown or black) in the wound bed. 4th and 5th toes appear n (Active)  05/01/20 0230  Location: Foot (4th and 5th toes and lateral/anterior side of the sole)  Location Orientation: Left;Posterior  Staging: Unstageable - Full thickness tissue loss in which the base of the injury is covered by slough (yellow, tan, gray, green or brown) and/or eschar (tan, brown or black) in the wound bed.  Wound Description (Comments): 4th and 5th toes appear necrotic circular wound on sole  Present on Admission: Yes     Pressure Injury 05/01/20 Foot Right;Medial;Posterior Unstageable - Full thickness tissue loss in which the base of the injury is covered by slough (yellow, tan, gray, green or brown) and/or eschar (tan, brown or black) in the wound bed. large circular wo (Active)  05/01/20 0230  Location: Foot  Location Orientation: Right;Medial;Posterior  Staging: Unstageable - Full thickness tissue loss in which the base of the injury is covered by slough (yellow, tan, gray, green or brown) and/or eschar (tan, brown or black) in the wound bed.  Wound Description (Comments): large circular wound on sole  and heel  Present  on Admission: Yes     Pressure Injury 05/08/20 Heel Right Unstageable - Full thickness tissue loss in which the base of the injury is covered by slough (yellow, tan, gray, green or brown) and/or eschar (tan, brown or black) in the wound bed. (Active)  05/08/20 1718  Location: Heel  Location Orientation: Right  Staging: Unstageable - Full thickness tissue loss in which the base of the injury is covered by slough (yellow, tan, gray, green or brown) and/or eschar (tan, brown or black) in the wound bed.  Wound Description  (Comments):   Present on Admission: Yes    Physical Exam: Vital Signs Blood pressure (!) 154/94, pulse 96, temperature 97.9 F (36.6 C), temperature source Oral, resp. rate 18, height '6\' 6"'$  (1.981 m), weight (!) 155.3 kg, SpO2 99 %.   Physical Exam  General: laying supine in bed- eating cereal, NAD Mood and affect are flat, c/o nausea Heart: RRR_ no JVD Lungs: CTA B/L- no W/R/R- good air movement Abdomen: Soft, slightly TTP, ND, (+)BS  Extremities: No clubbing, cyanosis, or edema  Skin:    Comments: Sacral decubitus as well as bilateral heel dressings are in place change routinely per wound care nurse  Neurological: more oriented    Comments: Patient is alert in no acute distress. 5/5 strength throughout.     Assessment/Plan: 1. Functional deficits which require 3+ hours per day of interdisciplinary therapy in a comprehensive inpatient rehab setting.  Physiatrist is providing close team supervision and 24 hour management of active medical problems listed below.  Physiatrist and rehab team continue to assess barriers to discharge/monitor patient progress toward functional and medical goals  Care Tool:  Bathing    Body parts bathed by patient: Right arm,Left arm,Chest,Abdomen,Right upper leg,Left upper leg,Face   Body parts bathed by helper: Right lower leg,Left lower leg,Buttocks     Bathing assist Assist Level: Minimal Assistance - Patient > 75% (declined LB today, EOB level)     Upper Body Dressing/Undressing Upper body dressing   What is the patient wearing?: Pull over shirt    Upper body assist Assist Level: Contact Guard/Touching assist    Lower Body Dressing/Undressing Lower body dressing      What is the patient wearing?: Pants     Lower body assist Assist for lower body dressing: Minimal Assistance - Patient > 75% (bed level)     Toileting Toileting    Toileting assist Assist for toileting: Maximal Assistance - Patient 25 - 49% Assistive Device  Comment:  (urinal)   Transfers Chair/bed transfer  Transfers assist  Chair/bed transfer activity did not occur: Safety/medical concerns (Per nursing report)  Chair/bed transfer assist level: Moderate Assistance - Patient 50 - 74% (slideboard)     Locomotion Ambulation   Ambulation assist   Ambulation activity did not occur: Safety/medical concerns (fatigue, lightheadedness, generalized weakness)          Walk 10 feet activity   Assist  Walk 10 feet activity did not occur: Safety/medical concerns (fatigue, lightheadedness, generalized weakness)        Walk 50 feet activity   Assist Walk 50 feet with 2 turns activity did not occur: Safety/medical concerns (fatigue, lightheadedness, generalized weakness)         Walk 150 feet activity   Assist Walk 150 feet activity did not occur: Safety/medical concerns (fatigue, lightheadedness, generalized weakness)         Walk 10 feet on uneven surface  activity   Assist Walk 10 feet on uneven surfaces  activity did not occur: Safety/medical concerns (fatigue, lightheadedness, generalized weakness)         Wheelchair     Assist Will patient use wheelchair at discharge?: Yes Type of Wheelchair: Manual    Wheelchair assist level: Supervision/Verbal cueing Max wheelchair distance: 15'    Wheelchair 50 feet with 2 turns activity    Assist        Assist Level: Supervision/Verbal cueing   Wheelchair 150 feet activity     Assist      Assist Level: Minimal Assistance - Patient > 75%   Blood pressure (!) 154/94, pulse 96, temperature 97.9 F (36.6 C), temperature source Oral, resp. rate 18, height '6\' 6"'$  (1.981 m), weight (!) 155.3 kg, SpO2 99 %.  Medical Problem List and Plan: 1.  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 2.  Antithrombotics: -DVT/anticoagulation: Subcutaneous heparin.   Check vascular study             -antiplatelet therapy: N/A 3. Pain Management: Tylenol as needed  1/22- pt reports pain controlled- con't regimen 4. Mood: Provide emotional support             -antipsychotic agents: N/A 5. Neuropsych: This patient is? capable of making decisions on his own behalf.  1/17- pt appeared slightly confused but amiable this AM- will monitor  1/20- more with it this AM 6. Skin/Wound Care: Routine skin checks 7. Fluids/Electrolytes/Nutrition: Routine in and outs with follow-up chemistries 8.  End-stage renal disease.  Hemodialysis per renal services 9.  Sacral decubitus ulcer as well as ischemic ulcers on the left foot fourth toe and fifth metatarsal head and right heel and fifth metatarsal head.  Wound care nurse follow-up.  Patient to see Dr. Sharol Given outpatient.  Continue bilateral PRAFO's. 10.  ID/ coccygeal osteomyelitis.  Continue vancomycin and cefepime through 06/10/2020  1/17- WBC 8.1  1/18- will schedule turning q2 hours to get off wounds- also will make sure on specialty bed  1/20- is on specialty bed 10.  Anemia of chronic disease.  Continue Aranesp. Hgb 8.8 on 1/16, repeat with HD  1/17- no CBC today, only BMP- will try and check with HD on Tuesday.   1/19- doing better based on labs yesterday 11.  Diabetes mellitus.  Hemoglobin A1c 5.6.  Lantus insulin 14 units nightly             1/16: CBGs 108-167: continue current regimen.   1/17- BGs 102-160- con't regimen  1/18- per pharmacy, want to decrease his insulin due to fasting BG of 85- will decrease to 11 units, and wasn't on insulin at home. Based on ESRD, will NEED insulin at home.   1/19- will let nursing know to teach pt- placed Diabetes coordinator consult yesterday- waiting for them to see pt.   1/22- seen by Diabetes coordinator they want me to decrease Lantus to 8 units, however Bgs running 86-120- which is ideal- will wait to decrease esp with all his wounds-  CBG (last 3)  Recent Labs     06/14/20 1646 06/14/20 2107 06/15/20 0859  GLUCAP 142* 170* 154*  1/25- BGs overall controlled- con't regimen 12.  Morbid obesity.  BMI 38.37.  Dietary follow-up  13. Dizziness/nausea with HTN  1/17- Sx's so significant, vomited this AM when got up- will try abd binder and ACE wraps for LEs- since due  to osteomyelitis, don't think can use TEDs- if need be, can add Midodrine, but due to ESRD, cannot start Florinef.  1/18- BP laying is 164/101 this AM- might not do well with midodrine?  1/19- since not clear if actually orthostatic, per renal, will order daily orthostatics x7 days.   1/20- BP 177/108 this AM- used to be on Metoprolol and higher dose of  Norvasc- will give 1x time Metoprolol 12.5 mg since BP so high AND pulse is in 80s  1/23- stating to think he's so used to really high BP, so when comes down to more normal level, he's hypotensive.   1/25- BP slightly elevated- when c/o dizziness, BP not lower- but Sx's are improving- con't to monitor  14. Diarrhea with new constipation  1/18- LBM 3 days ago- on imodium 2 mg q4 hours around the clock- will change to q8 hours to see if can get him to have BM.  1/19- wil give Sorbitol 60 ml today since no BM- maintain lower dose of imodium per pt request.  1/20- thinks sorbitol made him sick- it's actually the ileus that made him sick- refusing NG tube- will give Suppository per pt request (KUB shows likely ileus) and changed diet to clear liquid diet for now.   1/22- will try Sorbitol again- since cannot do Mg citrate and last BM 2 days ago- was small.   1/23- still no BM_ordered soap suds enema Pt refused . And add Miralax back.   Pt states he was on 3 stool softeners on acute care which worked for him  Will start senna S 2 po BID   1/25- restarted miralax daily- pt scared of loose stools, but said will "try it"".    LOS: 9 days A FACE TO FACE EVALUATION WAS PERFORMED  Fred [name] 06/15/2020, 9:12 AM

## 2020-06-15 NOTE — Progress Notes (Signed)
Physical Therapy Session Note  Patient Details  Name: Kiren Havron MRN: [id number] Date of Birth: [birth year]  Today's Date: 06/15/2020 PT Individual Time: [id number]; 03-1154 PT Individual Time Calculation (min): 47 min and 24mns Missed 17 mins  Short Term Goals: Week 2:  PT Short Term Goal 1 (Week 2): Pt will transfer bed<>chair with LRAD and min A PT Short Term Goal 2 (Week 2): Pt will transfer sit<>stand with LRAD and mod A PT Short Term Goal 3 (Week 2): pt will perform all bed mobility with CGA PT Short Term Goal 4 (Week 2): pt will recall pressure relief schedule and safe techniques with pressure relief PT Short Term Goal 5 (Week 2): pt will tolerate sitting OOB for 45 mins without adverse effects  Skilled Therapeutic Interventions/Progress Updates:    Session 1: Patient received sidelying in bed, agreeable to "as much as (he) can tolerate." He denies pain when asked. BP in L sidelying: 119/73 (86) 93BPM. He was able to come sit edge of bed with supervision and use of bedrails. BP in sitting: 105/71 (82) 109BPM. He was able to remain sitting edge of bed with U UE support and close supervision. He reports "moderate" dizziness, but no increase in nausea. BP after sitting up 2 mins: 117/84 (94) 101BPM. Patient agreeable to seated therex: 3x8 LAQ, 3x10 marches, 3x12 UE punches. Patient requiring extended rest break between tasks due to fatigue. Patient requesting to lay back down due to increasing dizziness. BP assessed before laying down: 126/85 (95) 108BPM. Patient returning supine with supervision. Bed alarm on, call light within reach.   Session 2: Patient received supine in bed, agreeable to PT. He denies pain. Supine BP 172/95 (117) 88 BPM. He was able to come sit edge of bed with supervision and use of bed rails. BP sitting: 136/96 (109) 98BPM with reports of dizziness. BP after sitting 2 mins: 148/100 (113) 96BPM. He was able to transfer to wc via slideboard with MinA. He was able to  propel himself in wc ~1028fbefore fatiguing. PT propelling patient in wc remainder of way to therapy gym. Kinetron at 30cm/s 4x45s. Patient able to stand x2 in parallel bars with MinA/ModA to stand and B UE support. He was able to remain standing ~2 mins with CGA and reports of mild dizziness. Patient returning to room in wc, transferring back to bed via slideboard and MinA. Bed alarm on, call light within reach.   Therapy Documentation Precautions:  Precautions Precautions: Fall Precaution Comments: wounds on bilat feet and L sacrum, monitor orthostatic BPs Required Braces or Orthoses: Other Brace Other Brace: abdominal binder Restrictions Weight Bearing Restrictions: No    Therapy/Group: Individual Therapy  JeKaroline CaldwellPT, DPT, CBIS  06/15/2020, 7:40 AM

## 2020-06-15 NOTE — Progress Notes (Signed)
Patient ID: Fred [name], male   DOB: July 07, 1970, 50 y.o.   MRN: [id number]  Met with pt and spoke with sister on the phone while in pt's room-speaker to discuss team conference progression in therapies. Limited by BP and nausea. Have extended discharge to 2/14. Pt needs to be mobile when going home and not in a wheelchair due to Dad's house is not wheelchair accessible nor can Dad provide assistance too. Pt pleased stood in parallel bars yesterday. Sister voiced he could fit in the front door or she can get a ramp but the care and inside the house will be a problem. Pt is pushing himself to do well in therapies with his nausea and now constipation issues. Will keep working on best discharge plan for pt and Dad.

## 2020-06-15 NOTE — Progress Notes (Signed)
[facility] Progress Note   Subjective:   Pt seen in room. Reports he feels dizzy after sitting up for BP check. No hypotensive readings recorded, pt tends to have persistent hypertension. Denies SOB, CP, palpitations, abdominal pain, nausea and vomiting.   Objective Vitals:   06/14/20 1147 06/14/20 1954 06/15/20 0502 06/15/20 1307  BP: (!) 111/92 (!) 152/52 (!) 154/94 (!) 150/84  Pulse: (!) 109 93 96 93  Resp: '18 18 18 14  '$ Temp: 98.6 F (37 C) 98.5 F (36.9 C) 97.9 F (36.6 C) 97.9 F (36.6 C)  TempSrc:  Oral Oral   SpO2: 100% 99% 99% 99%  Weight:      Height:       Physical Exam  General: Well developed male, alert and in NAD Heart: RRR, no murmurs, rubs or gallops Lungs: CTA bilaterally, no wheezing, rhonchi or rales Abdomen: Soft, non-distended, +BS Extremities: no edema bilateral upper and lower extremities Dialysis Access: R [facility]    Additional Objective Labs: Basic Metabolic Panel: Recent Labs  Lab 06/10/20 1440 06/12/20 1400  NA 135 136  K 4.6 3.9  CL 96* 99  CO2 27 28  GLUCOSE 112* 113*  BUN 40* 24*  CREATININE 6.80* 5.19*  CALCIUM 8.8* 8.5*  PHOS 6.7* 4.7*   Liver Function Tests: Recent Labs  Lab 06/10/20 1440 06/12/20 1400  ALBUMIN 2.0* 1.9*   No results for input(s): LIPASE, AMYLASE in the last 168 hours. CBC: Recent Labs  Lab 06/10/20 1440 06/12/20 1400  WBC 9.6 5.9  HGB 9.6* 9.7*  HCT 31.7* 31.1*  MCV 86.1 85.2  PLT 276 221   Blood Culture    Component Value Date/Time   SDES BLOOD SITE NOT SPECIFIED 04/29/2020 1210   SPECREQUEST  04/29/2020 1210    BOTTLES DRAWN AEROBIC AND ANAEROBIC Blood Culture adequate volume   CULT  04/29/2020 1210    NO GROWTH 5 DAYS Performed at [facility], [place] [address], [place], [place] [address]    REPTSTATUS 05/04/2020 FINAL 04/29/2020 1210   CBG: Recent Labs  Lab 06/14/20 1147 06/14/20 1646 06/14/20 2107 06/15/20 0859 06/15/20 1204  GLUCAP 110* 142* 170* 154* 163*    Medications:  . heparin sodium (porcine)      . (feeding supplement) PROSource Plus  30 mL Oral TID BM  . amLODipine  5 mg Oral QHS  . Chlorhexidine Gluconate Cloth  6 each Topical BID  . darbepoetin (ARANESP) injection - DIALYSIS  200 mcg Intravenous Q Tue-HD  . famotidine  20 mg Oral QHS  . feeding supplement (NEPRO CARB STEADY)  237 mL Oral BID BM  . heparin injection (subcutaneous)  7,500 Units Subcutaneous Q8H  . insulin aspart  0-9 Units Subcutaneous TID WC  . insulin glargine  11 Units Subcutaneous QHS  . lanthanum  500 mg Oral TID WC  . metoCLOPramide  5 mg Oral TID AC  . multivitamin  1 tablet Oral QHS  . [START ON 06/16/2020] polyethylene glycol  17 g Oral Daily  . senna-docusate  2 tablet Oral BID    Dialysis Orders: NWTTS 4h 400/800 143.5kg 3K/2.5Ca TDCHeparin3000units IV TIW - Mircera180mgIVq2wks - last 12/8 - Venofer '100mg'$ IVqHD x10 - completed 6/10 doses   Assessment/Plan: 1. Sacral decub/coccygealosteo:S/p 6 weeks ofVanc/Cefepime (finished1/20/22) - ID following.Continues hydrotherapy. In CIR. 2. 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 3. ESRD:Back on usual TTS schedule, for HD today. UF goal limited by relative hypotension/dizziness.  4. Diarrhea -Recurrent issue, thought  to be d/t Renvelawhich has now been stopped.Appears to be resolved now- reporting constipation 5. BP/volume:BPimproving.Previously havingissues with hypotension which improved once amlodipine and metoprolol d/c'd. Keep SBP >100 on HD.Pt continues to complain of dizziness despite BP staying moderately elevated. ? If this is due to patient being used to high BP.  6. COVID PNA- tested positive on 11/30, is off isolation now.  7. Anemiaof CKD: Hgb 9.7,cont Aranesp 224mgqSat. Rechecking with HD today.  8. Secondary Hyperparathyroidism:CorrCa10.2, use low Ca bath.Not on VDRA.Stoppedrenvela 2/2diarrhea, tryingFosrenol instead  and phos is at goal. 9. Nutrition:Alb remains low, continue Nepro. 10. DMT2 - per admit  SAnice Paganini PA-C 06/15/2020, 3:11 PM  [facility] Pager: [phone]

## 2020-06-15 NOTE — Progress Notes (Signed)
Occupational Therapy Weekly Progress Note  [name] MRN: [id number] Date of Birth: 1971/05/09  Beginning of progress report period: June 07, 2020 End of progress report period: June 15, 2020  Today's Date: 06/15/2020 OT Individual Time: 1607-3710  OT Individual Time Calculation (min): 78 min    Patient has met 0 of 4 short term goals.  Pt is overall Min-Mod with LB bathing and dressing at bed level with use of compensatory strategies but continues to need assistance with washing buttocks but is progressing and needing less assist. Slide board transfers with Mod A consistently but at some times Min A, limited d/t fatigue and N/V. Pt has been severely limited during OT sessions d/t nausea and BP drop. However pt is motivated to participate.   Patient continues to demonstrate the following deficits: muscle weakness, decreased cardiorespiratoy endurance, impaired timing and sequencing, unbalanced muscle activation and decreased motor planning, decreased initiation, decreased safety awareness and delayed processing,   and decreased sitting balance, decreased standing balance, decreased postural control and decreased balance strategies and therefore will continue to benefit from skilled OT intervention to enhance overall performance with BADL and Reduce care partner burden.  Patient progressing toward long term goals..  Continue plan of care.  OT Short Term Goals Week 1:  OT Short Term Goal 1 (Week 1): patient will completed bed mobility with CS OT Short Term Goal 1 - Progress (Week 1): Progressing toward goal OT Short Term Goal 2 (Week 1): patient will complete sit pivot transfers with min A OT Short Term Goal 2 - Progress (Week 1): Not met OT Short Term Goal 3 (Week 1): patient will complete lower body bathing and dressing with min A and use of assistive devices as needed OT Short Term Goal 3 - Progress (Week 1): Progressing toward goal OT Short Term Goal 4  (Week 1): patient will tolerate sitting out of bed for 3-4 hours OT Short Term Goal 4 - Progress (Week 1): Not met Week 2:  OT Short Term Goal 1 (Week 2): Pt will tolerate sitting OOB for 2-3 hours OT Short Term Goal 2 (Week 2): Pt will complete LB bathing and dressing with Min A w/ AE PRN OT Short Term Goal 3 (Week 2): Pt will complete transfer to [facility] with LRAD with Min A OT Short Term Goal 4 (Week 2): Pt will perform sit to stand at [facility] with Max A of 1 consistently  Skilled Therapeutic Interventions/Progress Updates:    Session 1: Pt greeted at time of session reclined in bed sleeping but easily woken and agreeable to OT session, no pain. Had not eaten breakfast but food items present in room, given a few minutes to eat breakfast prior to mobility with Mod I. Encouraged to sit up in chair or EOB to eat, pt did not want to do so but did allow therapist to raise HOB to eat. Donned ace wraps supine and binder sitting EOB. Supine to sit CGA/Min A with bed rails, BP 149/96, slide board transfer to chair Min/Mod A after placement with cues to anterior weight shift and bump along board. BP 133/100 up in chair. Self propel > sink for oral hygiene and washing face. Self propel room > apartment for practice propelling on carpet and around kitchen/home environment. Discussed IADL modifications to kitchen for easier access from w/c level. Performed BUE there ex and core strengthening with 6# ball with torso twists and overhead raises for 2x15. Once back in room, set up in  chair but had episode of BP drop, with 9/10 dizziness BP 101/65. Slide board back to bed Mod A. Call bell in reach all needs met, RN aware of episode.     Therapy Documentation Precautions:  Precautions Precautions: Fall Precaution Comments: wounds on bilat feet and L sacrum, monitor orthostatic BPs Required Braces or Orthoses: Other Brace Other Brace: abdominal binder Restrictions Weight Bearing Restrictions: No    Therapy/Group:  Individual Therapy  [name] 06/15/2020, 11:26 AM

## 2020-06-15 NOTE — Patient Care Conference (Signed)
Inpatient RehabilitationTeam Conference and Plan of Care Update Date: 06/15/2020   Time: 11:35 AM    Patient Name: Fred [name]      Medical Record Number: [id number]  Date of Birth: 1970-12-22 Sex: Male         Room/Bed: 4M05C/4M05C-01 Payor Info: Payor: MEDICARE / Plan: MEDICARE PART A AND B / Product Type: *No Product type* /    Admit Date/Time:  06/06/2020  1:05 PM  Primary Diagnosis:  Critical illness myopathy  Hospital Problems: Principal Problem:   Critical illness myopathy    Expected Discharge Date: Expected Discharge Date: 07/05/20  Team Members Present: Physician leading conference: Dr. Courtney Heys Care Coodinator Present: Dorthula Nettles, RN, BSN, CRRN;Becky Dupree, LCSW Nurse Present: Annita Brod, LPN PT Present: Stacy Gardner, PT OT Present: Lillia Corporal, OT PPS Coordinator present : Ileana Ladd, Burna Mortimer, SLP     Current Status/Progress Goal Weekly Team Focus  Bowel/Bladder   Pt is oliguric. Pt voids very little at times but is continent. Pt incont/cont of bowel. LBM 06/13/20  Remain continent and prevent constipation/diarrhea  Pt PRN's available for constipation and diarrhea. Assess need for interventions qshift and PRN   Swallow/Nutrition/ Hydration             ADL's   LB bathe Mod, LB dress Min A at bed level. UB ADL Supervision/CGA, very weak and deconditioned, last SB transfer was Mod A but has declined getting OOB recently d/t nausea and dizziness  CS/Mod I w/c level  ADL retraining, sit to stands, standing balance, OOB tolerance, UB strengthening, sitting balance/core strength   Mobility   supervision with bedrails rolling, min A sit<>supervision, min A supine>sit, SM transfers min A-mod A  Mod I WC level, CGA for dynamic standing/gait  tolerance to activity, strengthening, balance in standing, othostatic management, transfers   Communication             Safety/Cognition/ Behavioral Observations            Pain   Pt denies pain.   Remain free of pain.  Assess pain level qshift and PRN.   Skin   Stage 4 pressure injury to sacrum. Ulcers to bilateral feet.  Prevent further skin breakdown and skin infection  Stage 4 (Sacrum)- W/D covered with ABD or foam. Bilateral foot ulcers- Betadine and gauze. Reposition q2H. Utilize prafo boots while in bed. Continue pressure reducing mattress.     Discharge Planning:  Hopefully to return home with Dad will need to see  if home wheelchiar accessilble and id feasible for going home. Had been in a NH since 01/2020   Team Discussion: CBG's doing well, still on a specialty bed, patient reports being constipated but is scared of having loose stools. MD can't order Mag citrate because he is a HD patient. Sorbitol did not work well. He is continent B/B. Stage IV to bottom, pressure injuries to both feet, using prafo's but does refuse at times. Question if he can go home with his 71 yr. father. Doesn't think the W/C will fit in hallway. Patient on target to meet rehab goals: Bed level mod assist for ADL's with using bed features. Beezy board did not go well. Dizziness is limiting progress. Orthostatic vital signs are up and down, with no real reason. Min to mod assist with slideboard transfers. Therapy asking is goal is to get him up and out of bed do we use the ted hose and abdominal binder? MD said yes if out of bed, but if  doing bed level therapy then no.   *See Care Plan and progress notes for long and short-term goals.   Revisions to Treatment Plan:  Renal declines use of Midodrine due to very high BP when lying down.  Teaching Needs: Family education, medication management, diabetes management, fluid management, transfer training, wound/skin care management, weight bearing precautions  Current Barriers to Discharge: Inaccessible home environment, Decreased caregiver support, Home enviroment access/layout, Wound care, Lack of/limited family support, Insurance for SNF coverage, Weight,  Hemodialysis, Weight bearing restrictions, Medication compliance and Behavior  Possible Resolutions to Barriers: Continue current medications, provide wound/skin care, provide emotional support to patient and family.     Medical Summary Current Status: ESRD- mod to severe constipation- LBM 1/23 very small-dizzy/nausea- renal declines Midodrine due to very high lying BP- do ACE wraps/abd binder when really up  Barriers to Discharge: Decreased family/caregiver support;Home enviroment access/layout;Weight;Other (comments);Wound care;Hemodialysis;Medical stability;Incontinence;Insurance for SNF coverage;Weight bearing restrictions;Medication compliance  Barriers to Discharge Comments: BMI 39;  42f 6inches- 155 kg; ulcers on B/L feet and backside- using PRAFOs- refuses sometimes; w/c won't fit down hallway- d/c 2/14 Possible Resolutions to BCelanese CorporationFocus: mod A bed level (specialty bed); beazy board didn't work- using b[employer] dizziness limits therapy; BP labile- very high to orthostatic;   Continued Need for Acute Rehabilitation Level of Care: The patient requires daily medical management by a physician with specialized training in physical medicine and rehabilitation for the following reasons: Direction of a multidisciplinary physical rehabilitation program to maximize functional independence : Yes Medical management of patient stability for increased activity during participation in an intensive rehabilitation regime.: Yes Analysis of laboratory values and/or radiology reports with any subsequent need for medication adjustment and/or medical intervention. : Yes   I attest that I was present, lead the team conference, and concur with the assessment and plan of the team.   JCristi Loron1/25/2022, 12:52 PM

## 2020-06-16 ENCOUNTER — Inpatient Hospital Stay (HOSPITAL_COMMUNITY): Payer: Medicare Other

## 2020-06-16 ENCOUNTER — Inpatient Hospital Stay (HOSPITAL_COMMUNITY): Payer: Medicare Other | Admitting: Occupational Therapy

## 2020-06-16 LAB — GLUCOSE, CAPILLARY
Glucose-Capillary: 136 mg/dL — ABNORMAL HIGH (ref 70–99)
Glucose-Capillary: 215 mg/dL — ABNORMAL HIGH (ref 70–99)
Glucose-Capillary: 134 mg/dL — ABNORMAL HIGH (ref 70–99)
Glucose-Capillary: 127 mg/dL — ABNORMAL HIGH (ref 70–99)
Glucose-Capillary: 181 mg/dL — ABNORMAL HIGH (ref 70–99)

## 2020-06-16 MED ORDER — CHLORHEXIDINE GLUCONATE CLOTH 2 % EX PADS
6.0000 | MEDICATED_PAD | Freq: Every day | CUTANEOUS | Status: DC
  Administered 2020-06-17 – 2020-06-20 (×4): 6 via TOPICAL

## 2020-06-16 NOTE — Progress Notes (Signed)
Occupational Therapy Session Note  [name] MRN: [id number] Date of Birth: [birth year]  Today's Date: 06/16/2020 OT Individual Time: 7121-9758 and 8325-4982 OT Individual Time Calculation (min): 58 min and 56 min   Short Term Goals: Week 2:  OT Short Term Goal 1 (Week 2): Pt will tolerate sitting OOB for 2-3 hours OT Short Term Goal 2 (Week 2): Pt will complete LB bathing and dressing with Min A w/ AE PRN OT Short Term Goal 3 (Week 2): Pt will complete transfer to [facility] with LRAD with Min A OT Short Term Goal 4 (Week 2): Pt will perform sit to stand at [facility] with Max A of 1 consistently   Skilled Therapeutic Interventions/Progress Updates:    Session 1: Pt greeted at time of session supine in bed on bed pan trying to have BM, offered to adjust position to elevate HOB for comfort and positioning but pt declined. Given a few minutes at beginning of session for BM but unable. Rolling L/R to remove bed pan and dependent hygiene/cleaning. Donned pants with Min A and gown sitting EOB with CGA as plan to do laundry. Supine > sit Supervision with bed features. Slide board > w/c Min/Mod A and BP 112/84 and no complaints. Self propel partially to laundry room, assisted with putting in clothes and tide pod with min A for core strength and forward reaching, also performing familiar tasks from wheelchair level. 2x20 at rebounder seated for UB and core strengthening. Pt feeling need to have BM with stomach cramps, returned to room and slide board to bed Mod A, pt did have BM and rolling L/R Supervision for therapist to dependently change brief. No dizziness reported today just stomach cramps. Pt left on bed pan per request to see if more BM. Call bell in reach all needs met.    Session 2: Pt reclined in bed at time of session stating he was soiled and needed brief change, pt did have BM in brief. Rolled L/R with supervision and bed rails for therapist to perform dependent brief change  and hygiene. Just before putting on brief, RN entered and performed wound care and dressing change as pt was already in a good position. After dressing change, donned pants bed level with CGA with rolling L/R to don over hips. At this time pt stated BP had been too high earlier and PT had to end session, BP checked in supine and 153/98 which tends to be his normal. Supine to sit CGA with bed rail, slide board Min/Mod A to wheelchair, no dizziness reported. Self propelled part of the way to laundry room, assist with remaining distance d.t time and energy. Pt able to remove clothing from dryer with CGA to imrpove core strength and sitting balance. Once back in room, pt wanting to sit up in chair 1 hour until next PT session. Mild to no dizziness reported. Up in chair with alarm on, call bell in reach. RN aware pt up in chair.    Therapy Documentation Precautions:  Precautions Precautions: Fall Precaution Comments: wounds on bilat feet and L sacrum, monitor orthostatic BPs Required Braces or Orthoses: Other Brace Other Brace: abdominal binder Restrictions Weight Bearing Restrictions: No     Therapy/Group: Individual Therapy  [name] 06/16/2020, 7:06 AM

## 2020-06-16 NOTE — Progress Notes (Signed)
[facility] PHYSICAL MEDICINE & REHABILITATION PROGRESS NOTE   Subjective/Complaints:   Pt reports had 3 BMs yesterday- 2 were medium ot large- 1 was tiny. Had 2-3 tiny balls this AM, so not really a BM.  Drinking prune juice as well as taking Miralax.  Dizziness is ok right now.    ROS:  Pt denies SOB, abd pain, CP, N/V/C/D, and vision changes   Objective:   No results found. Recent Labs    06/15/20 1511  WBC 11.9*  HGB 9.8*  HCT 32.3*  PLT 278   Recent Labs    06/15/20 1512  NA 135  K 4.7  CL 97*  CO2 25  GLUCOSE 137*  BUN 60*  CREATININE 7.38*  CALCIUM 8.9    Intake/Output Summary (Last 24 hours) at 06/16/2020 0830 Last data filed at 06/15/2020 1806 Gross per 24 hour  Intake 680 ml  Output 1500 ml  Net -820 ml     Pressure Injury 05/01/20 Buttocks Left Stage 4 - Full thickness tissue loss with exposed bone, tendon or muscle. packed with santyl gauze, dressed by WOCN, (Active)  05/01/20 0230  Location: Buttocks  Location Orientation: Left  Staging: Stage 4 - Full thickness tissue loss with exposed bone, tendon or muscle.  Wound Description (Comments): packed with santyl gauze, dressed by WOCN,  Present on Admission: Yes     Pressure Injury 05/01/20 Ankle Left;Lateral Unstageable - Full thickness tissue loss in which the base of the injury is covered by slough (yellow, tan, gray, green or brown) and/or eschar (tan, brown or black) in the wound bed. smal circular wound on the (Active)  05/01/20 0230  Location: Ankle  Location Orientation: Left;Lateral  Staging: Unstageable - Full thickness tissue loss in which the base of the injury is covered by slough (yellow, tan, gray, green or brown) and/or eschar (tan, brown or black) in the wound bed.  Wound Description (Comments): smal circular wound on the bone  Present on Admission: Yes     Pressure Injury 05/01/20 Foot Left;Posterior Unstageable - Full thickness tissue loss in which the base of the injury is  covered by slough (yellow, tan, gray, green or brown) and/or eschar (tan, brown or black) in the wound bed. 4th and 5th toes appear n (Active)  05/01/20 0230  Location: Foot (4th and 5th toes and lateral/anterior side of the sole)  Location Orientation: Left;Posterior  Staging: Unstageable - Full thickness tissue loss in which the base of the injury is covered by slough (yellow, tan, gray, green or brown) and/or eschar (tan, brown or black) in the wound bed.  Wound Description (Comments): 4th and 5th toes appear necrotic circular wound on sole  Present on Admission: Yes     Pressure Injury 05/01/20 Foot Right;Medial;Posterior Unstageable - Full thickness tissue loss in which the base of the injury is covered by slough (yellow, tan, gray, green or brown) and/or eschar (tan, brown or black) in the wound bed. large circular wo (Active)  05/01/20 0230  Location: Foot  Location Orientation: Right;Medial;Posterior  Staging: Unstageable - Full thickness tissue loss in which the base of the injury is covered by slough (yellow, tan, gray, green or brown) and/or eschar (tan, brown or black) in the wound bed.  Wound Description (Comments): large circular wound on sole  and heel  Present on Admission: Yes     Pressure Injury 05/08/20 Heel Right Unstageable - Full thickness tissue loss in which the base of the injury is covered by slough (yellow, tan,  gray, green or brown) and/or eschar (tan, brown or black) in the wound bed. (Active)  05/08/20 1718  Location: Heel  Location Orientation: Right  Staging: Unstageable - Full thickness tissue loss in which the base of the injury is covered by slough (yellow, tan, gray, green or brown) and/or eschar (tan, brown or black) in the wound bed.  Wound Description (Comments):   Present on Admission: Yes    Physical Exam: Vital Signs Blood pressure 137/82, pulse 95, temperature 97.8 F (36.6 C), resp. rate 18, height '6\' 6"'$  (1.981 m), weight (!) 155.3 kg, SpO2 97  %.   Physical Exam  General: laying supine in bed- OT in room, NAD Mood and affect are flat, c/o nausea Heart:RRR- no JVD Lungs: somewhat coarse breath sounds- decreased at bases slightly more B/L Abdomen: soft, protuberant, NT, somewhat distended, (+) BS Extremities: No clubbing, cyanosis, or edema  Skin:    Comments: Sacral decubitus as well as bilateral heel dressings are in place change routinely per wound care nurse  Neurological: more oriented    Comments: Patient is alert in no acute distress. 5/5 strength throughout.     Assessment/Plan: 1. Functional deficits which require 3+ hours per day of interdisciplinary therapy in a comprehensive inpatient rehab setting.  Physiatrist is providing close team supervision and 24 hour management of active medical problems listed below.  Physiatrist and rehab team continue to assess barriers to discharge/monitor patient progress toward functional and medical goals  Care Tool:  Bathing    Body parts bathed by patient: Right arm,Left arm,Chest,Abdomen,Right upper leg,Left upper leg,Face   Body parts bathed by helper: Right lower leg,Left lower leg,Buttocks     Bathing assist Assist Level: Minimal Assistance - Patient > 75% (declined LB today, EOB level)     Upper Body Dressing/Undressing Upper body dressing   What is the patient wearing?: Pull over shirt    Upper body assist Assist Level: Contact Guard/Touching assist    Lower Body Dressing/Undressing Lower body dressing      What is the patient wearing?: Pants     Lower body assist Assist for lower body dressing: Minimal Assistance - Patient > 75% (bed level)     Toileting Toileting    Toileting assist Assist for toileting: Maximal Assistance - Patient 25 - 49% Assistive Device Comment:  (urinal)   Transfers Chair/bed transfer  Transfers assist  Chair/bed transfer activity did not occur: Safety/medical concerns (Per nursing report)  Chair/bed transfer  assist level: Minimal Assistance - Patient > 75% Chair/bed transfer assistive device: Sliding board   Locomotion Ambulation   Ambulation assist   Ambulation activity did not occur: Safety/medical concerns (fatigue, lightheadedness, generalized weakness)          Walk 10 feet activity   Assist  Walk 10 feet activity did not occur: Safety/medical concerns (fatigue, lightheadedness, generalized weakness)        Walk 50 feet activity   Assist Walk 50 feet with 2 turns activity did not occur: Safety/medical concerns (fatigue, lightheadedness, generalized weakness)         Walk 150 feet activity   Assist Walk 150 feet activity did not occur: Safety/medical concerns (fatigue, lightheadedness, generalized weakness)         Walk 10 feet on uneven surface  activity   Assist Walk 10 feet on uneven surfaces activity did not occur: Safety/medical concerns (fatigue, lightheadedness, generalized weakness)         Wheelchair     Assist Will patient use wheelchair at  discharge?: Yes Type of Wheelchair: Manual    Wheelchair assist level: Supervision/Verbal cueing Max wheelchair distance: 100    Wheelchair 50 feet with 2 turns activity    Assist        Assist Level: Supervision/Verbal cueing   Wheelchair 150 feet activity     Assist      Assist Level: Minimal Assistance - Patient > 75%   Blood pressure 137/82, pulse 95, temperature 97.8 F (36.6 C), resp. rate 18, height '6\' 6"'$  (1.981 m), weight (!) 155.3 kg, SpO2 97 %.  Medical Problem List and Plan: 1.  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 2.  Antithrombotics: -DVT/anticoagulation: Subcutaneous heparin.  Check vascular study             -antiplatelet therapy: N/A 3. Pain Management: Tylenol as needed  1/26- pain controlled- con't regimen 4. Mood: Provide emotional support              -antipsychotic agents: N/A 5. Neuropsych: This patient is? capable of making decisions on his own behalf.  1/17- pt appeared slightly confused but amiable this AM- will monitor  1/20- more with it this AM 6. Skin/Wound Care: Routine skin checks 7. Fluids/Electrolytes/Nutrition: Routine in and outs with follow-up chemistries 8.  End-stage renal disease.  Hemodialysis per renal services 9.  Sacral decubitus ulcer as well as ischemic ulcers on the left foot fourth toe and fifth metatarsal head and right heel and fifth metatarsal head.  Wound care nurse follow-up.  Patient to see Dr. Sharol Given outpatient.  Continue bilateral PRAFO's. 10.  ID/ coccygeal osteomyelitis.  Continue vancomycin and cefepime through 06/10/2020  1/17- WBC 8.1  1/18- will schedule turning q2 hours to get off wounds- also will make sure on specialty bed  1/20- is on specialty bed 10.  Anemia of chronic disease.  Continue Aranesp. Hgb 8.8 on 1/16, repeat with HD  1/17- no CBC today, only BMP- will try and check with HD on Tuesday.   1/19- doing better based on labs yesterday  1/26- Hb 9.8- con't regimen 11.  Diabetes mellitus.  Hemoglobin A1c 5.6.  Lantus insulin 14 units nightly             1/16: CBGs 108-167: continue current regimen.   1/17- BGs 102-160- con't regimen  1/18- per pharmacy, want to decrease his insulin due to fasting BG of 85- will decrease to 11 units, and wasn't on insulin at home. Based on ESRD, will NEED insulin at home.   1/19- will let nursing know to teach pt- placed Diabetes coordinator consult yesterday- waiting for them to see pt.   1/22- seen by Diabetes coordinator they want me to decrease Lantus to 8 units, however Bgs running 86-120- which is ideal- will wait to decrease esp with all his wounds-  CBG (last 3)  Recent Labs    06/15/20 1734 06/15/20 2058 06/16/20 0603  GLUCAP 114* 193* 136*  1/25- BGs overall controlled- con't regimen 1/26- BGs 114-193- labile- con't regimen- since was  doing great til today 12.  Morbid obesity.  BMI 38.37.  Dietary follow-up  13. Dizziness/nausea with HTN  1/17- Sx's so significant, vomited this AM when got up- will try abd binder and ACE wraps for LEs- since due to osteomyelitis, don't think can use TEDs- if need be, can add Midodrine, but due to ESRD,  cannot start Florinef.  1/18- BP laying is 164/101 this AM- might not do well with midodrine?  1/19- since not clear if actually orthostatic, per renal, will order daily orthostatics x7 days.   1/20- BP 177/108 this AM- used to be on Metoprolol and higher dose of  Norvasc- will give 1x time Metoprolol 12.5 mg since BP so high AND pulse is in 80s  1/23- stating to think he's so used to really high BP, so when comes down to more normal level, he's hypotensive.   1/25- BP slightly elevated- when c/o dizziness, BP not lower- but Sx's are improving- con't to monitor  14. Diarrhea with new constipation  1/18- LBM 3 days ago- on imodium 2 mg q4 hours around the clock- will change to q8 hours to see if can get him to have BM.  1/19- wil give Sorbitol 60 ml today since no BM- maintain lower dose of imodium per pt request.  1/20- thinks sorbitol made him sick- it's actually the ileus that made him sick- refusing NG tube- will give Suppository per pt request (KUB shows likely ileus) and changed diet to clear liquid diet for now.   1/22- will try Sorbitol again- since cannot do Mg citrate and last BM 2 days ago- was small.   1/23- still no BM_ordered soap suds enema Pt refused . And add Miralax back.   Pt states he was on 3 stool softeners on acute care which worked for him  Will start senna S 2 po BID   1/25- restarted miralax daily- pt scared of loose stools, but said will "try it"".  1/26- had 2 large Bms and 1 tiny BM- finally working- con't regimen-don't stop miralax/sorbitol right now.  15. Leukocytosis  1/26- will check CXR since WBC up to 11.9- Tm 99.1- not elevated.     LOS: 10 days A FACE TO  FACE EVALUATION WAS PERFORMED  Alijah Hyde 06/16/2020, 8:30 AM

## 2020-06-16 NOTE — Progress Notes (Signed)
[facility] Progress Note   Subjective:   Reports dizziness is better today and he was not dizzy during/after dialysis yesterday. C/o crampy abdominal pain and constipation but was able to have several BMs today. Denies SOB, CP, palpitations.   Objective Vitals:   06/15/20 1630 06/15/20 1703 06/15/20 1929 06/16/20 0601  BP: (!) 142/78 (!) 157/92 (!) 138/94 137/82  Pulse: 96 97 99 95  Resp:  '16 18 18  '$ Temp:  98.3 F (36.8 C) 99.1 F (37.3 C) 97.8 F (36.6 C)  TempSrc:  Oral Oral   SpO2:  98% 98% 97%  Weight:      Height:       Physical Exam General:Well developed male, alert and in NAD Heart:RRR, no murmurs, rubs or gallops Lungs:CTA bilaterally, no wheezing, rhonchi or rales Abdomen:Soft, non-distended, +BS Extremities:no edema bilateral upper and lower extremities Dialysis Access:[facility]  Additional Objective Labs: Basic Metabolic Panel: Recent Labs  Lab 06/10/20 1440 06/12/20 1400 06/15/20 1512  NA 135 136 135  K 4.6 3.9 4.7  CL 96* 99 97*  CO2 '27 28 25  '$ GLUCOSE 112* 113* 137*  BUN 40* 24* 60*  CREATININE 6.80* 5.19* 7.38*  CALCIUM 8.8* 8.5* 8.9  PHOS 6.7* 4.7* 5.6*   Liver Function Tests: Recent Labs  Lab 06/10/20 1440 06/12/20 1400 06/15/20 1512  ALBUMIN 2.0* 1.9* 2.1*   CBC: Recent Labs  Lab 06/10/20 1440 06/12/20 1400 06/15/20 1511  WBC 9.6 5.9 11.9*  HGB 9.6* 9.7* 9.8*  HCT 31.7* 31.1* 32.3*  MCV 86.1 85.2 86.4  PLT 276 221 278   Blood Culture    Component Value Date/Time   SDES BLOOD SITE NOT SPECIFIED 04/29/2020 1210   SPECREQUEST  04/29/2020 1210    BOTTLES DRAWN AEROBIC AND ANAEROBIC Blood Culture adequate volume   CULT  04/29/2020 1210    NO GROWTH 5 DAYS Performed at [facility], [address], [place], [place] [address]    REPTSTATUS 05/04/2020 FINAL 04/29/2020 1210   CBG: Recent Labs  Lab 06/15/20 1734 06/15/20 2058 06/16/20 0603 06/16/20 0857 06/16/20 1146  GLUCAP 114* 193* 136* 215*  134*    Studies/Results: DG CHEST PORT 1 VIEW  Result Date: 06/16/2020 CLINICAL DATA:  Dyspnea on exertion. EXAM: PORTABLE CHEST 1 VIEW COMPARISON:  04/29/2020 and prior. FINDINGS: Right IJ CVC tip overlies the superior cavoatrial junction. No pneumothorax or pleural effusion. Mild hypoinflation. Minimal patchy left basilar opacities. Stable cardiomediastinal silhouette. No acute osseous abnormality. IMPRESSION: Minimal left basilar opacities, atelectasis versus infiltrate. Electronically Signed   By: Primitivo Gauze M.D.   On: 06/16/2020 09:01   Medications:  . (feeding supplement) PROSource Plus  30 mL Oral TID BM  . amLODipine  5 mg Oral QHS  . Chlorhexidine Gluconate Cloth  6 each Topical BID  . darbepoetin (ARANESP) injection - DIALYSIS  200 mcg Intravenous Q Tue-HD  . famotidine  20 mg Oral QHS  . feeding supplement (NEPRO CARB STEADY)  237 mL Oral BID BM  . heparin injection (subcutaneous)  7,500 Units Subcutaneous Q8H  . insulin aspart  0-9 Units Subcutaneous TID WC  . insulin glargine  11 Units Subcutaneous QHS  . lanthanum  500 mg Oral TID WC  . metoCLOPramide  5 mg Oral TID AC  . multivitamin  1 tablet Oral QHS  . polyethylene glycol  17 g Oral Daily  . senna-docusate  2 tablet Oral BID    Dialysis Orders: NWTTS 4h 400/800 143.5kg 3K/2.5Ca TDCHeparin3000units IV TIW -  Mircera164mgIVq2wks - last 12/8 - Venofer '100mg'$ IVqHD x10 - completed 6/10 doses  Assessment/Plan: 1. Sacral decub/coccygealosteo:S/p 6 weeks ofVanc/Cefepime (finished1/20/22) - ID following. In CIR. 2. Osteomyelitis L foot/ pressure injury bilat heels: Wound care per primary team 3. ESRD:Back on usual TTS schedule, for HD tomorrow.  4. Diarrhea -Recurrent issue, thought to be d/t Renvelawhich has now been stopped.Appears to be resolved now- reporting constipation 5. BP/volume:BPimproving.Previously havingissues with hypotension which improved once amlodipine and metoprolol  d/c'd. Keep SBP >100 on HD.Pt continues to complain of dizziness despite BP staying moderately elevated but symptoms continue to improve. Weights trending back down.  6. COVID PNA- tested positive on 11/30, is off isolation now.  7. Anemiaof CKD: Hgb 9.8,cont Aranesp 2058mqSat.  8. Secondary Hyperparathyroidism:CorrCa10.2,use low Ca bath.Not on VDRA.Stoppedrenvela 2/2diarrhea, tryingFosrenol insteadand phos is close to goal.  9. Nutrition:Alb remains low, continue Nepro. 10. DMT2 - per admit   SaAnice PaganiniPA-C 06/16/2020, 12:21 PM  [facility] Pager: [phone]

## 2020-06-16 NOTE — Progress Notes (Signed)
Physical Therapy Session Note  [name] MRN: [id number] Date of Birth: 08-30-1970  Today's Date: 06/16/2020 PT Individual Time: 1000-1030 PT Individual Time Calculation (min): 30 min  and Today's Date: 06/16/2020 PT Missed Time: 30 Minutes Missed Time Reason: Other (Comment) (BP 193/121, RN requesting patient not participate in therapy)  Short Term Goals: Week 2:  PT Short Term Goal 1 (Week 2): Pt will transfer bed<>chair with LRAD and min A PT Short Term Goal 2 (Week 2): Pt will transfer sit<>stand with LRAD and mod A PT Short Term Goal 3 (Week 2): pt will perform all bed mobility with CGA PT Short Term Goal 4 (Week 2): pt will recall pressure relief schedule and safe techniques with pressure relief PT Short Term Goal 5 (Week 2): pt will tolerate sitting OOB for 45 mins without adverse effects  Skilled Therapeutic Interventions/Progress Updates:    Patient received supine in bed, agreeable to PT. He denies pain when asked, but expressed frustration at the number of bowel movements he has had so far. Patient able to roll B with supervision using bed rails to don brief. MinA to don pants at bedlevel. BP then assessed with patient supine. BP L UE: 193/121 (143) 99BPM. BP assessed R UE: 164/114 (126) 101BPM. Patient denies HA/dizziness. RN alerted to patients BP readings requesting that PT discontinue session. Patient remains in bed, bed alarm on, call light within reach.   Therapy Documentation Precautions:  Precautions Precautions: Fall Precaution Comments: wounds on bilat feet and L sacrum, monitor orthostatic BPs Required Braces or Orthoses: Other Brace Other Brace: abdominal binder Restrictions Weight Bearing Restrictions: No    Therapy/Group: Individual Therapy  Karoline Caldwell, PT, DPT, CBIS  06/16/2020, 7:53 AM

## 2020-06-16 NOTE — Progress Notes (Signed)
Physical Therapy Session Note  [name] MRN: [id number] Date of Birth: 27-Sep-1970  Today's Date: 06/16/2020 PT Individual Time: 1600-1630 PT Individual Time Calculation (min): 30 min   Short Term Goals: Week 2:  PT Short Term Goal 1 (Week 2): Pt will transfer bed<>chair with LRAD and min A PT Short Term Goal 2 (Week 2): Pt will transfer sit<>stand with LRAD and mod A PT Short Term Goal 3 (Week 2): pt will perform all bed mobility with CGA PT Short Term Goal 4 (Week 2): pt will recall pressure relief schedule and safe techniques with pressure relief PT Short Term Goal 5 (Week 2): pt will tolerate sitting OOB for 45 mins without adverse effects  Skilled Therapeutic Interventions/Progress Updates:    Patient in supine and given 1 hour rest from previously scheduled session due to up in chair 30 min after OT and became dizzy and nauseated.  Patient supine to sit with S using rails, increased time and effort.  Seated EOB with feet on stool for core strengthening with blue weighted ball moving ball shoulder to shoulder, hip to hip, in/out, up/down x 10 each.  Then performed seated hip flexion, LAQ, hip abduction with yellow band around knees x 10 each.  Passive stretch to hamstrings and heel cords 3 x 20 sec each.  Then performed resisted shoulder flexion and horizontal abduction x 10 each with yellow t-band.  Sit to supine with S and pt scooted to [facility] using headboard.  Left in supine with call bell and needs in reach.   Therapy Documentation Precautions:  Precautions Precautions: Fall Precaution Comments: wounds on bilat feet and L sacrum, monitor orthostatic BPs Required Braces or Orthoses: Other Brace Other Brace: abdominal binder Restrictions Weight Bearing Restrictions: No Pain: Pain Assessment Pain Score: 0-No pain    Therapy/Group: Individual Therapy  Reginia Naas  Magda Kiel, PT 06/16/2020, 12:54 PM

## 2020-06-17 ENCOUNTER — Inpatient Hospital Stay (HOSPITAL_COMMUNITY): Payer: Medicare Other | Admitting: Physical Therapy

## 2020-06-17 ENCOUNTER — Inpatient Hospital Stay (HOSPITAL_COMMUNITY): Payer: Medicare Other

## 2020-06-17 LAB — GLUCOSE, CAPILLARY
Glucose-Capillary: 100 mg/dL — ABNORMAL HIGH (ref 70–99)
Glucose-Capillary: 209 mg/dL — ABNORMAL HIGH (ref 70–99)
Glucose-Capillary: 122 mg/dL — ABNORMAL HIGH (ref 70–99)
Glucose-Capillary: 130 mg/dL — ABNORMAL HIGH (ref 70–99)

## 2020-06-17 MED ORDER — HEPARIN SODIUM (PORCINE) 1000 UNIT/ML IJ SOLN
INTRAMUSCULAR | Status: AC
  Filled 2020-06-17: qty 4

## 2020-06-17 MED ORDER — PNEUMOCOCCAL VAC POLYVALENT 25 MCG/0.5ML IJ INJ
0.5000 mL | INJECTION | INTRAMUSCULAR | Status: DC
  Filled 2020-06-17: qty 0.5

## 2020-06-17 MED ORDER — HYDRALAZINE HCL 25 MG PO TABS
25.0000 mg | ORAL_TABLET | Freq: Four times a day (QID) | ORAL | Status: DC | PRN
Start: 2020-06-17 — End: 2020-06-17
  Filled 2020-06-17: qty 1

## 2020-06-17 MED ORDER — HYDRALAZINE HCL 25 MG PO TABS
25.0000 mg | ORAL_TABLET | Freq: Four times a day (QID) | ORAL | Status: DC | PRN
  Administered 2020-06-18: 25 mg via ORAL

## 2020-06-17 NOTE — Progress Notes (Signed)
Occupational Therapy Session Note  [name] MRN: [id number] Date of Birth: 1970/12/02  Today's Date: 06/17/2020 OT Individual Time: 0700-0755 OT Individual Time Calculation (min): 55 min    Short Term Goals: Week 1:  OT Short Term Goal 1 (Week 1): patient will completed bed mobility with CS OT Short Term Goal 1 - Progress (Week 1): Progressing toward goal OT Short Term Goal 2 (Week 1): patient will complete sit pivot transfers with min A OT Short Term Goal 2 - Progress (Week 1): Not met OT Short Term Goal 3 (Week 1): patient will complete lower body bathing and dressing with min A and use of assistive devices as needed OT Short Term Goal 3 - Progress (Week 1): Progressing toward goal OT Short Term Goal 4 (Week 1): patient will tolerate sitting out of bed for 3-4 hours OT Short Term Goal 4 - Progress (Week 1): Not met  Skilled Therapeutic Interventions/Progress Updates:     Pt received in bed agreeable to OT. Pt requires increased time to arouse and participate. Pt completes supine>sitting EOB with MIN A for trunk elevation. Pt reporting no pain at beginning of session. Vitals assessed at EOB after self feeding breakfast on RUE. 172/107 with large cuff. Remeasured BP with small cuff 170/110. Pt returns to supine and OT demo sock aide while pt resting in supine to check BP 184/105. MD present during supine BP and provided order for PRN BP med. RN alerted.   ADL: Pt self feeds seated EOB with S and increased time to management packages and containers Pt completes bathing with S for sitting balance at EOB. Pt leans laterally and OT washes buttocks initiated education on LHSS to was feet as needed but did not issue sponge at this time. Board under feet placed for improved sitting balance Pt completes UB dressing donning gown in supine Pt completes LB dressing with S in supine crossing into semi figure 4 to thread BLE into pants and rolling in B directions to advance  pants past hips. Pt completes footwear with doffs/dons socks with sock aide and reacher seated EOB  Pt missed 20 min skilled OT d/t increased BP. RN and MD aware. Pt left at end of session in bed with exit alarm on, call light in reach and all needs met   Therapy Documentation Precautions:  Precautions Precautions: Fall Precaution Comments: wounds on bilat feet and L sacrum, monitor orthostatic BPs Required Braces or Orthoses: Other Brace Other Brace: abdominal binder Restrictions Weight Bearing Restrictions: No General:   Vital Signs: Therapy Vitals Temp: 98.3 F (36.8 C) Temp Source: Oral Pulse Rate: 89 Resp: 18 BP: (!) 170/92 Patient Position (if appropriate): Lying Oxygen Therapy SpO2: 100 % O2 Device: Room Air Pain:   ADL: ADL Eating: Set up,Supervision/safety Where Assessed-Eating: Bed level Grooming: Supervision/safety,Setup Where Assessed-Grooming: Sitting at sink,Wheelchair Upper Body Bathing: Minimal assistance Where Assessed-Upper Body Bathing: Wheelchair,Sitting at sink Lower Body Bathing: Moderate assistance Where Assessed-Lower Body Bathing: Bed level Upper Body Dressing: Setup,Supervision/safety Where Assessed-Upper Body Dressing: Sitting at sink,Wheelchair Lower Body Dressing: Maximal assistance Where Assessed-Lower Body Dressing: Bed level Vision   Perception    Praxis   Exercises:   Other Treatments:     Therapy/Group: Individual Therapy  Tonny Branch 06/17/2020, 6:59 AM

## 2020-06-17 NOTE — Progress Notes (Signed)
Physical Therapy Session Note  [name] MRN: [id number] Date of Birth: 07-25-1970  Today's Date: 06/17/2020 PT Individual Time: 3500-9381 and 1100-1158 PT Individual Time Calculation (min): 55 min and 58 mins  Short Term Goals: Week 1:  PT Short Term Goal 1 (Week 1): pt will perform all bed mobility with CGA PT Short Term Goal 1 - Progress (Week 1): Not met PT Short Term Goal 2 (Week 1): Pt will transfer sit<>stand with LRAD and mod A PT Short Term Goal 2 - Progress (Week 1): Not met PT Short Term Goal 3 (Week 1): Pt will transfer bed<>chair with LRAD and min A PT Short Term Goal 3 - Progress (Week 1): Not met Week 2:  PT Short Term Goal 1 (Week 2): Pt will transfer bed<>chair with LRAD and min A PT Short Term Goal 2 (Week 2): Pt will transfer sit<>stand with LRAD and mod A PT Short Term Goal 3 (Week 2): pt will perform all bed mobility with CGA PT Short Term Goal 4 (Week 2): pt will recall pressure relief schedule and safe techniques with pressure relief PT Short Term Goal 5 (Week 2): pt will tolerate sitting OOB for 45 mins without adverse effects      Skilled Therapeutic Interventions/Progress Updates:    pt received in bed and agreeable to therapy. Nursing reported pt's BP was not at levels for medication when he monitored it and to monitor during session. [name] taken in supine at 138/78 and heart rate 83. Pt directed in supine>sit min A for trunk support and once in sitting for 4 mins pt's BP taken to be 132/109 heart rate 96. Pt denied dizziness and pt donned B ace wraps on BLE and abdominal binder total A and B socks. Pt then directed in total of x6 Sit to stand from EOB with stedy use with pt able to achieve full upright standing one time and 25-50% upright rest of reps with mod -max A from PT. Pt required prolonged rest breaks post each rep 2/2 fatigue and recovery. Pt's BP taken to be 139/96 heart rate 96  After first rep of standing and 117/88 heart rate  102 after 4 reps of 25-50% standing upright and 136/97 heart rate 101 after full stand with pt able to maintain standing with BUE support at stedy for 45s. Pt denied dizziness post each rep and reported slight increase in lightheadedness after final rep of standing. Nursing present for medication passing and updated on Bps. Pt directed in sitting EOB for recovery at SBA static and CGA dynamic with functional tasks at EOB. Pt then directed in sit>supine CGA with PT doffing binder and ace wraps. Pt left in bed, All needs in reach and in good condition. Call light in hand.  Nursing aware.  Session 2: pt received in bed and agreeable to therapy. BP taken in supine to be 173/96 heart rate 90. Nursing made aware and agreeable to pt transferring into sitting and reassess, pt very motivated to attempt. Pt directed in supine>sit min A for trunk support. Post 3 mins in sitting BP take to be 145/95 heart rate 101 pt denied dizziness. BP retested with 3 mins additionally for safety to be 132/92 heart rate 97. PT donned abdominal binder and BLE ace wraps due to increased time in upright position. Pt directed in x5 Sit to stand from EOB with stedy mod A-max A to fully stand upright however pt achieved fully upright stand each time and tolerated standing  30-45s each. Pt's BP monitored post standing to be 124/99. Pt directed in seated unsupported BLE and BUE strengthening exercises of 2x15 marching, LAQ, hip abd/adduction, ankle pumps, and overhead press and bicep curls. Pt directed in sit>supine CGA and CGA with bed mobility for positioning, pt left in bed, All needs in reach and in good condition. Call light in hand.     Therapy Documentation Precautions:  Precautions Precautions: Fall Precaution Comments: wounds on bilat feet and L sacrum, monitor orthostatic BPs Required Braces or Orthoses: Other Brace Other Brace: abdominal binder Restrictions Weight Bearing Restrictions: No General:   Vital Signs: Therapy  Vitals BP: (!) 170/92 Patient Position (if appropriate): Lying Pain:   Mobility:   Locomotion :    Trunk/Postural Assessment :    Balance:   Exercises:   Other Treatments:      Therapy/Group: Individual Therapy  [name] 06/17/2020, 9:55 AM

## 2020-06-17 NOTE — Progress Notes (Signed)
[place] PHYSICAL MEDICINE & REHABILITATION PROGRESS NOTE   Subjective/Complaints:   Pt reports had 3 more BMs yesterday- feels like getting cleaned out- 2 extra large BMs and 1 extra small again. Denies any dizziness with laying OR sitting this AM, however BP 185/105- lowest has been 170/90s- I think his dizziness is when BP lower than he's used to, which is still high/normal, so it's then causing "orthostasis" due to his normal BP being so high.  Will order Hydralazine prn for elevated SBP >180 or DBP >110.   ROS:  Pt denies SOB, abd pain, CP, N/V/C/D, and vision changes   Objective:   DG CHEST PORT 1 VIEW  Result Date: 06/16/2020 CLINICAL DATA:  Dyspnea on exertion. EXAM: PORTABLE CHEST 1 VIEW COMPARISON:  04/29/2020 and prior. FINDINGS: Right IJ CVC tip overlies the superior cavoatrial junction. No pneumothorax or pleural effusion. Mild hypoinflation. Minimal patchy left basilar opacities. Stable cardiomediastinal silhouette. No acute osseous abnormality. IMPRESSION: Minimal left basilar opacities, atelectasis versus infiltrate. Electronically Signed   By: Primitivo Gauze M.D.   On: 06/16/2020 09:01   Recent Labs    06/15/20 1511  WBC 11.9*  HGB 9.8*  HCT 32.3*  PLT 278   Recent Labs    06/15/20 1512  NA 135  K 4.7  CL 97*  CO2 25  GLUCOSE 137*  BUN 60*  CREATININE 7.38*  CALCIUM 8.9    Intake/Output Summary (Last 24 hours) at 06/17/2020 0845 Last data filed at 06/17/2020 0541 Gross per 24 hour  Intake 680 ml  Output 125 ml  Net 555 ml     Pressure Injury 05/01/20 Buttocks Left Stage 4 - Full thickness tissue loss with exposed bone, tendon or muscle. packed with santyl gauze, dressed by WOCN, (Active)  05/01/20 0230  Location: Buttocks  Location Orientation: Left  Staging: Stage 4 - Full thickness tissue loss with exposed bone, tendon or muscle.  Wound Description (Comments): packed with santyl gauze, dressed by WOCN,  Present on Admission: Yes      Pressure Injury 05/01/20 Ankle Left;Lateral Unstageable - Full thickness tissue loss in which the base of the injury is covered by slough (yellow, tan, gray, green or brown) and/or eschar (tan, brown or black) in the wound bed. smal circular wound on the (Active)  05/01/20 0230  Location: Ankle  Location Orientation: Left;Lateral  Staging: Unstageable - Full thickness tissue loss in which the base of the injury is covered by slough (yellow, tan, gray, green or brown) and/or eschar (tan, brown or black) in the wound bed.  Wound Description (Comments): smal circular wound on the bone  Present on Admission: Yes     Pressure Injury 05/01/20 Foot Left;Posterior Unstageable - Full thickness tissue loss in which the base of the injury is covered by slough (yellow, tan, gray, green or brown) and/or eschar (tan, brown or black) in the wound bed. 4th and 5th toes appear n (Active)  05/01/20 0230  Location: Foot (4th and 5th toes and lateral/anterior side of the sole)  Location Orientation: Left;Posterior  Staging: Unstageable - Full thickness tissue loss in which the base of the injury is covered by slough (yellow, tan, gray, green or brown) and/or eschar (tan, brown or black) in the wound bed.  Wound Description (Comments): 4th and 5th toes appear necrotic circular wound on sole  Present on Admission: Yes     Pressure Injury 05/01/20 Foot Right;Medial;Posterior Unstageable - Full thickness tissue loss in which the base of the injury is covered  by slough (yellow, tan, gray, green or brown) and/or eschar (tan, brown or black) in the wound bed. large circular wo (Active)  05/01/20 0230  Location: Foot  Location Orientation: Right;Medial;Posterior  Staging: Unstageable - Full thickness tissue loss in which the base of the injury is covered by slough (yellow, tan, gray, green or brown) and/or eschar (tan, brown or black) in the wound bed.  Wound Description (Comments): large circular wound on sole  and heel   Present on Admission: Yes     Pressure Injury 05/08/20 Heel Right Unstageable - Full thickness tissue loss in which the base of the injury is covered by slough (yellow, tan, gray, green or brown) and/or eschar (tan, brown or black) in the wound bed. (Active)  05/08/20 1718  Location: Heel  Location Orientation: Right  Staging: Unstageable - Full thickness tissue loss in which the base of the injury is covered by slough (yellow, tan, gray, green or brown) and/or eschar (tan, brown or black) in the wound bed.  Wound Description (Comments):   Present on Admission: Yes    Physical Exam: Vital Signs Blood pressure (!) 170/92, pulse 89, temperature 98.3 F (36.8 C), temperature source Oral, resp. rate 18, height 6' 6"  (1.981 m), weight (!) 155.3 kg, SpO2 100 %.   Physical Exam  General: laying supine in bed- working with OT to get shirt on- sat EOB as well, NAD Mood and affect are flat- no intonation change Heart: RRR- no JVD seen Lungs: CTA B/L- no W/R/R- good air movement Abdomen: soft, NT, less distended- almost flat for him, hypoactive BS Extremities: No clubbing, cyanosis, or edema  Skin:    Comments: Sacral decubitus as well as bilateral heel dressings are in place - cannot assess due to positioning.  Neurological: Ox3, but flat    Comments: Patient is alert in no acute distress. 5/5 strength throughout.     Assessment/Plan: 1. Functional deficits which require 3+ hours per day of interdisciplinary therapy in a comprehensive inpatient rehab setting.  Physiatrist is providing close team supervision and 24 hour management of active medical problems listed below.  Physiatrist and rehab team continue to assess barriers to discharge/monitor patient progress toward functional and medical goals  Care Tool:  Bathing    Body parts bathed by patient: Right arm,Left arm,Chest,Abdomen,Right upper leg,Left upper leg,Face   Body parts bathed by helper: Right lower leg,Left lower  leg,Buttocks     Bathing assist Assist Level: Minimal Assistance - Patient > 75% (declined LB today, EOB level)     Upper Body Dressing/Undressing Upper body dressing   What is the patient wearing?: Pull over shirt    Upper body assist Assist Level: Contact Guard/Touching assist    Lower Body Dressing/Undressing Lower body dressing      What is the patient wearing?: Pants     Lower body assist Assist for lower body dressing: Minimal Assistance - Patient > 75% (bed level)     Toileting Toileting    Toileting assist Assist for toileting: Maximal Assistance - Patient 25 - 49% Assistive Device Comment:  (urinal)   Transfers Chair/bed transfer  Transfers assist  Chair/bed transfer activity did not occur: Safety/medical concerns (Per nursing report)  Chair/bed transfer assist level: Moderate Assistance - Patient 50 - 74% Chair/bed transfer assistive device: Sliding board   Locomotion Ambulation   Ambulation assist   Ambulation activity did not occur: Safety/medical concerns (fatigue, lightheadedness, generalized weakness)          Walk 10 feet activity  Assist  Walk 10 feet activity did not occur: Safety/medical concerns (fatigue, lightheadedness, generalized weakness)        Walk 50 feet activity   Assist Walk 50 feet with 2 turns activity did not occur: Safety/medical concerns (fatigue, lightheadedness, generalized weakness)         Walk 150 feet activity   Assist Walk 150 feet activity did not occur: Safety/medical concerns (fatigue, lightheadedness, generalized weakness)         Walk 10 feet on uneven surface  activity   Assist Walk 10 feet on uneven surfaces activity did not occur: Safety/medical concerns (fatigue, lightheadedness, generalized weakness)         Wheelchair     Assist Will patient use wheelchair at discharge?: Yes Type of Wheelchair: Manual    Wheelchair assist level: Supervision/Verbal cueing Max  wheelchair distance: 100    Wheelchair 50 feet with 2 turns activity    Assist        Assist Level: Supervision/Verbal cueing   Wheelchair 150 feet activity     Assist      Assist Level: Minimal Assistance - Patient > 75%   Blood pressure (!) 170/92, pulse 89, temperature 98.3 F (36.8 C), temperature source Oral, resp. rate 18, height 6' 6"  (1.981 m), weight (!) 155.3 kg, SpO2 100 %.  Medical Problem List and Plan: 1.  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 2.  Antithrombotics: -DVT/anticoagulation: Subcutaneous heparin.  Check vascular study             -antiplatelet therapy: N/A 3. Pain Management: Tylenol as needed  1/26- pain controlled- con't regimen 4. Mood: Provide emotional support             -antipsychotic agents: N/A 5. Neuropsych: This patient is? capable of making decisions on his own behalf.  1/17- pt appeared slightly confused but amiable this AM- will monitor  1/20- more with it this AM  1/27- appears Ox3 this AM 6. Skin/Wound Care: Routine skin checks 7. Fluids/Electrolytes/Nutrition: Routine in and outs with follow-up chemistries 8.  End-stage renal disease.  Hemodialysis per renal services 9.  Sacral decubitus ulcer as well as ischemic ulcers on the left foot fourth toe and fifth metatarsal head and right heel and fifth metatarsal head.  Wound care nurse follow-up.  Patient to see Dr. Sharol Given outpatient.  Continue bilateral PRAFO's. 10.  ID/ coccygeal osteomyelitis.  Continue vancomycin and cefepime through 06/10/2020  1/17- WBC 8.1  1/18- will schedule turning q2 hours to get off wounds- also will make sure on specialty bed  1/20- is on specialty bed  1/27- off IV ABX for osteomyletis- WBC slightly elevated at 11.9- will check CRP and ESR for baseline? 10.  Anemia of chronic disease.  Continue Aranesp. Hgb 8.8 on 1/16, repeat with HD  1/17-  no CBC today, only BMP- will try and check with HD on Tuesday.   1/19- doing better based on labs yesterday  1/26- Hb 9.8- con't regimen 11.  Diabetes mellitus.  Hemoglobin A1c 5.6.  Lantus insulin 14 units nightly             1/16: CBGs 108-167: continue current regimen.   1/17- BGs 102-160- con't regimen  1/18- per pharmacy, want to decrease his insulin due to fasting BG of 85- will decrease to 11 units, and wasn't on insulin at  home. Based on ESRD, will NEED insulin at home.   1/19- will let nursing know to teach pt- placed Diabetes coordinator consult yesterday- waiting for them to see pt.   1/22- seen by Diabetes coordinator they want me to decrease Lantus to 8 units, however Bgs running 86-120- which is ideal- will wait to decrease esp with all his wounds-  CBG (last 3)  Recent Labs    06/16/20 1633 06/16/20 2121 06/17/20 0553  GLUCAP 127* 181* 100*  1/25- BGs overall controlled- con't regimen 1/26- BGs 114-193- labile- con't regimen- since was doing great til today  1/27- BGs 100-181- doing better- con't regimen 12.  Morbid obesity.  BMI 38.37.  Dietary follow-up  13. Dizziness/nausea with HTN  1/17- Sx's so significant, vomited this AM when got up- will try abd binder and ACE wraps for LEs- since due to osteomyelitis, don't think can use TEDs- if need be, can add Midodrine, but due to ESRD, cannot start Florinef.  1/18- BP laying is 164/101 this AM- might not do well with midodrine?  1/19- since not clear if actually orthostatic, per renal, will order daily orthostatics x7 days.   1/20- BP 177/108 this AM- used to be on Metoprolol and higher dose of  Norvasc- will give 1x time Metoprolol 12.5 mg since BP so high AND pulse is in 80s  1/23- stating to think he's so used to really high BP, so when comes down to more normal level, he's hypotensive.   1/25- BP slightly elevated- when c/o dizziness, BP not lower- but Sx's are improving- con't to monitor  1/27- will order hydralazine 25  mg PO q6 hours prn for SBP >180 or DBP >110- really think dizziness is "functional orthostasis" since his body is used to such high BPs  14. Diarrhea with new constipation  1/18- LBM 3 days ago- on imodium 2 mg q4 hours around the clock- will change to q8 hours to see if can get him to have BM.  1/19- wil give Sorbitol 60 ml today since no BM- maintain lower dose of imodium per pt request.  1/20- thinks sorbitol made him sick- it's actually the ileus that made him sick- refusing NG tube- will give Suppository per pt request (KUB shows likely ileus) and changed diet to clear liquid diet for now. .   Pt states he was on 3 stool softeners on acute care which worked for him  Will start senna S 2 po BID   1/25- restarted miralax daily- pt scared of loose stools, but said will "try it"".  1/26- had 2 large Bms and 1 tiny BM- finally working- con't regimen-don't stop miralax/sorbitol right now.  1.27- 2-3 more BMs yesterday- con't regimen- looking better  15. Leukocytosis  1/26- will check CXR since WBC up to 11.9- Tm 99.1- not elevated.   1/27- CXR showed minimal atalectasis- will monitor- will also recheck labs in AM with ESR/CRP due to wounds.     LOS: 11 days A FACE TO FACE EVALUATION WAS PERFORMED  Fred [name] 06/17/2020, 8:45 AM

## 2020-06-17 NOTE — Progress Notes (Signed)
Nutrition Follow-up  RD working remotely.  DOCUMENTATION CODES:   Morbid obesity  INTERVENTION:   - Continue renal MVI daily  - Continue ProSource Plus 30 ml po TID, each supplement provides 100 kcals and 15 grams protein  - Continue Nepro Shake poBID, each supplement provides 425 kcal and 19 grams protein  - Continue double protein portions at each meal  NUTRITION DIAGNOSIS:   Increased nutrient needs related to wound healing as evidenced by estimated needs.  Progressing  GOAL:   Patient will meet greater than or equal to 90% of their needs  Progressing  MONITOR:   PO intake,Supplement acceptance,Labs,Weight trends,Skin,I & O's  REASON FOR ASSESSMENT:   Malnutrition Screening Tool    ASSESSMENT:   50 year old right-handed male with PMH of HTN, DM, ESRD on HD. Pt admitted with critical illness/mypoathy/debilitation secondary to COVID-19.  1/20 - KUB showing possible ileus, diet changed to clear liquids 1/21 - diet advanced back to carb modified  Last HD on 1/25 with 1500 ml net UF. Next HD planned for today.  Per notes, pt experiencing constipation but seems to be improving on current bowel regimen.  Unable to reach pt via phone call to room. PO intake excellent with 7 out of last 8 meals documented as 100% completion. Per [facility] documentation, pt accepting ~50% of ProSource Plus and Nepro supplements. Will continue with current supplement regimen.  Meal Completion: 75-100% x last 8 documented meals (averaging 97%)  Medications reviewed and include: ProSource Plus TID, aranesp, pepcid, Nepro BID, SSI, lantus 11 units daily, fosrenol TID with meals, reglan TID before meals, rena-vit, miralax, senna  Labs reviewed: phosphorus 5.6, hemoglobin 9.8 CBG's: 100-209 x 24 hours  UOP: 125 ml x 24 hours  Diet Order:   Diet Order            Diet Carb Modified Fluid consistency: Thin; Room service appropriate? Yes  Diet effective now                  EDUCATION NEEDS:   No education needs have been identified at this time  Skin:  Skin Assessment: Skin Integrity Issues: Stage IV: left buttock Unstageable: left lateral ankle, left posterior foot, right medial foot, right heel  Last BM:  06/16/20  Height:   Ht Readings from Last 1 Encounters:  06/06/20 '6\' 6"'$  (1.981 m)    Weight:   Wt Readings from Last 1 Encounters:  06/05/20 (!) 161.9 kg    Ideal Body Weight:  97.3 kg  BMI:  Body mass index is 39.57 kg/m.  Estimated Nutritional Needs:   Kcal:  [id number]  Protein:  165-195 grams  Fluid:  1.2 L    Gustavus Bryant, MS, RD, LDN Inpatient Clinical Dietitian Please see [facility] for contact information.

## 2020-06-17 NOTE — Progress Notes (Signed)
[facility] Progress Note   Subjective:   Seen on HD. Feeling well, no new concerns. BP elevated this AM. No SOB, CP, dizziness, abdominal pain, nausea or vomiting.   Objective Vitals:   06/16/20 2014 06/17/20 0543 06/17/20 0608 06/17/20 1313  BP: (!) 158/96 (!) 180/92 (!) 170/92 (!) 177/99  Pulse: 90 89  92  Resp: '18 18  18  '$ Temp: 98.5 F (36.9 C) 98.3 F (36.8 C)  98.2 F (36.8 C)  TempSrc: Oral Oral    SpO2: 98% 100%  99%  Weight:      Height:       Physical Exam General:Well developed male, alert and in NAD Heart:RRR, no murmurs, rubs or gallops Lungs:CTA bilaterally, no wheezing, rhonchi or rales Abdomen:Soft, non-distended, +BS Extremities:no edema bilateral upper and lower extremities Dialysis Access:[facility]  Additional Objective Labs: Basic Metabolic Panel: Recent Labs  Lab 06/10/20 1440 06/12/20 1400 06/15/20 1512  NA 135 136 135  K 4.6 3.9 4.7  CL 96* 99 97*  CO2 '27 28 25  '$ GLUCOSE 112* 113* 137*  BUN 40* 24* 60*  CREATININE 6.80* 5.19* 7.38*  CALCIUM 8.8* 8.5* 8.9  PHOS 6.7* 4.7* 5.6*   Liver Function Tests: Recent Labs  Lab 06/10/20 1440 06/12/20 1400 06/15/20 1512  ALBUMIN 2.0* 1.9* 2.1*   No results for input(s): LIPASE, AMYLASE in the last 168 hours. CBC: Recent Labs  Lab 06/10/20 1440 06/12/20 1400 06/15/20 1511  WBC 9.6 5.9 11.9*  HGB 9.6* 9.7* 9.8*  HCT 31.7* 31.1* 32.3*  MCV 86.1 85.2 86.4  PLT 276 221 278   Blood Culture    Component Value Date/Time   SDES BLOOD SITE NOT SPECIFIED 04/29/2020 1210   SPECREQUEST  04/29/2020 1210    BOTTLES DRAWN AEROBIC AND ANAEROBIC Blood Culture adequate volume   CULT  04/29/2020 1210    NO GROWTH 5 DAYS Performed at [facility], [place] [address], [place], [place] [address]    REPTSTATUS 05/04/2020 FINAL 04/29/2020 1210   CBG: Recent Labs  Lab 06/16/20 1146 06/16/20 1633 06/16/20 2121 06/17/20 0553 06/17/20 1204  GLUCAP 134* 127* 181* 100* 209*     Studies/Results: DG CHEST PORT 1 VIEW  Result Date: 06/16/2020 CLINICAL DATA:  Dyspnea on exertion. EXAM: PORTABLE CHEST 1 VIEW COMPARISON:  04/29/2020 and prior. FINDINGS: Right IJ CVC tip overlies the superior cavoatrial junction. No pneumothorax or pleural effusion. Mild hypoinflation. Minimal patchy left basilar opacities. Stable cardiomediastinal silhouette. No acute osseous abnormality. IMPRESSION: Minimal left basilar opacities, atelectasis versus infiltrate. Electronically Signed   By: Primitivo Gauze M.D.   On: 06/16/2020 09:01   Medications:  . (feeding supplement) PROSource Plus  30 mL Oral TID BM  . amLODipine  5 mg Oral QHS  . Chlorhexidine Gluconate Cloth  6 each Topical BID  . Chlorhexidine Gluconate Cloth  6 each Topical Q0600  . darbepoetin (ARANESP) injection - DIALYSIS  200 mcg Intravenous Q Tue-HD  . famotidine  20 mg Oral QHS  . feeding supplement (NEPRO CARB STEADY)  237 mL Oral BID BM  . heparin injection (subcutaneous)  7,500 Units Subcutaneous Q8H  . insulin aspart  0-9 Units Subcutaneous TID WC  . insulin glargine  11 Units Subcutaneous QHS  . lanthanum  500 mg Oral TID WC  . metoCLOPramide  5 mg Oral TID AC  . multivitamin  1 tablet Oral QHS  . [START ON 06/18/2020] pneumococcal 23 valent vaccine  0.5 mL Intramuscular Tomorrow-1000  . polyethylene glycol  17 g Oral Daily  . senna-docusate  2 tablet Oral BID    Dialysis Orders: NWTTS 4h 400/800 143.5kg 3K/2.5Ca TDCHeparin3000units IV TIW - Mircera1105mgIVq2wks - last 12/8 - Venofer '100mg'$ IVqHD x10 - completed 6/10 doses  Assessment/Plan: 1. Sacral decub/coccygealosteo:S/p 6 weeks ofVanc/Cefepime (finished1/20/22) - ID following. In CIR. 2. Osteomyelitis L foot/ pressure injury bilat heels: Wound care per primary team 3. ESRD:Back on usual TTS schedule, HD today.  4. Diarrhea -Recurrent issue, thought to be d/t Renvelawhich has now been stopped.Appears to be resolved  now. 5. BP/volume:Previously havingissues with hypotension which improved once amlodipine and metoprolol d/c'd. Keep SBP >100 on HD.Pt continues to complain of dizziness despite BP staying moderately elevated but symptoms continue to improve. BP up today, monitor for improvement after volume removal with dialysis.   6. COVID PNA- tested positive on 11/30, is off isolation now.  7. Anemiaof CKD: Hgb 9.8,cont Aranesp [id number].  8. Secondary Hyperparathyroidism:CorrCa10.2,use low Ca bath.Not on VDRA.Stoppedrenvela 2/2diarrhea, tryingFosrenol insteadand phos is close to goal.  9. Nutrition:Alb remains low, continue Nepro. 10. DMT2 - per admit   SaAnice PaganiniPA-C 06/17/2020, 1:37 PM  [facility] Pager: [phone]

## 2020-06-18 LAB — CBC WITH DIFFERENTIAL/PLATELET
Abs Immature Granulocytes: 0.09 10*3/uL — ABNORMAL HIGH (ref 0.00–0.07)
Basophils Absolute: 0.1 10*3/uL (ref 0.0–0.1)
Basophils Relative: 1 %
Eosinophils Absolute: 0.3 10*3/uL (ref 0.0–0.5)
Eosinophils Relative: 3 %
HCT: 32.2 % — ABNORMAL LOW (ref 39.0–52.0)
Hemoglobin: 9.5 g/dL — ABNORMAL LOW (ref 13.0–17.0)
Immature Granulocytes: 1 %
Lymphocytes Relative: 18 %
Lymphs Abs: 1.7 10*3/uL (ref 0.7–4.0)
MCH: 26.2 pg (ref 26.0–34.0)
MCHC: 29.5 g/dL — ABNORMAL LOW (ref 30.0–36.0)
MCV: 88.7 fL (ref 80.0–100.0)
Monocytes Absolute: 0.8 10*3/uL (ref 0.1–1.0)
Monocytes Relative: 9 %
Neutro Abs: 6.5 10*3/uL (ref 1.7–7.7)
Neutrophils Relative %: 68 %
Platelets: 252 10*3/uL (ref 150–400)
RBC: 3.63 MIL/uL — ABNORMAL LOW (ref 4.22–5.81)
RDW: 16.7 % — ABNORMAL HIGH (ref 11.5–15.5)
WBC: 9.4 10*3/uL (ref 4.0–10.5)
nRBC: 0 % (ref 0.0–0.2)

## 2020-06-18 LAB — RENAL FUNCTION PANEL
Albumin: 2.1 g/dL — ABNORMAL LOW (ref 3.5–5.0)
Anion gap: 15 (ref 5–15)
BUN: 54 mg/dL — ABNORMAL HIGH (ref 6–20)
CO2: 25 mmol/L (ref 22–32)
Calcium: 8.8 mg/dL — ABNORMAL LOW (ref 8.9–10.3)
Chloride: 95 mmol/L — ABNORMAL LOW (ref 98–111)
Creatinine, Ser: 6.89 mg/dL — ABNORMAL HIGH (ref 0.61–1.24)
GFR, Estimated: 9 mL/min — ABNORMAL LOW (ref >60)
Glucose, Bld: 124 mg/dL — ABNORMAL HIGH (ref 70–99)
Phosphorus: 3.9 mg/dL (ref 2.5–4.6)
Potassium: 4.5 mmol/L (ref 3.5–5.1)
Sodium: 135 mmol/L (ref 135–145)

## 2020-06-18 LAB — SEDIMENTATION RATE: Sed Rate: 30 mm/hr — ABNORMAL HIGH (ref 0–16)

## 2020-06-18 LAB — GLUCOSE, CAPILLARY
Glucose-Capillary: 118 mg/dL — ABNORMAL HIGH (ref 70–99)
Glucose-Capillary: 137 mg/dL — ABNORMAL HIGH (ref 70–99)
Glucose-Capillary: 171 mg/dL — ABNORMAL HIGH (ref 70–99)
Glucose-Capillary: 161 mg/dL — ABNORMAL HIGH (ref 70–99)

## 2020-06-18 MED ORDER — AMLODIPINE BESYLATE 10 MG PO TABS
10.0000 mg | ORAL_TABLET | Freq: Every day | ORAL | Status: DC
  Administered 2020-06-18 – 2020-06-23 (×6): 10 mg via ORAL
  Filled 2020-06-18 (×6): qty 1

## 2020-06-18 NOTE — Progress Notes (Signed)
[facility] PHYSICAL MEDICINE & REHABILITATION PROGRESS NOTE   Subjective/Complaints:   Pt reports touch of dizziness when turns head fast, but almost resolved.  Small BM this AM, but feeling much less constipated.   Per nursing, wounds beefy red- no tunneling- 3cm deep at deepest.    ROS: Pt denies SOB, abd pain, CP, N/V/C/D, and vision changes   Objective:   DG CHEST PORT 1 VIEW  Result Date: 06/16/2020 CLINICAL DATA:  Dyspnea on exertion. EXAM: PORTABLE CHEST 1 VIEW COMPARISON:  04/29/2020 and prior. FINDINGS: Right IJ CVC tip overlies the superior cavoatrial junction. No pneumothorax or pleural effusion. Mild hypoinflation. Minimal patchy left basilar opacities. Stable cardiomediastinal silhouette. No acute osseous abnormality. IMPRESSION: Minimal left basilar opacities, atelectasis versus infiltrate. Electronically Signed   By: Primitivo Gauze M.D.   On: 06/16/2020 09:01   Recent Labs    06/15/20 1511 06/18/20 0020  WBC 11.9* 9.4  HGB 9.8* 9.5*  HCT 32.3* 32.2*  PLT 278 252   Recent Labs    06/15/20 1512 06/18/20 0020  NA 135 135  K 4.7 4.5  CL 97* 95*  CO2 25 25  GLUCOSE 137* 124*  BUN 60* 54*  CREATININE 7.38* 6.89*  CALCIUM 8.9 8.8*    Intake/Output Summary (Last 24 hours) at 06/18/2020 0841 Last data filed at 06/17/2020 2315 Gross per 24 hour  Intake 460 ml  Output 2625 ml  Net -2165 ml     Pressure Injury 05/01/20 Buttocks Left Stage 4 - Full thickness tissue loss with exposed bone, tendon or muscle. packed with santyl gauze, dressed by WOCN, (Active)  05/01/20 0230  Location: Buttocks  Location Orientation: Left  Staging: Stage 4 - Full thickness tissue loss with exposed bone, tendon or muscle.  Wound Description (Comments): packed with santyl gauze, dressed by WOCN,  Present on Admission: Yes     Pressure Injury 05/01/20 Ankle Left;Lateral Unstageable - Full thickness tissue loss in which the base of the injury is covered by slough (yellow,  tan, gray, green or brown) and/or eschar (tan, brown or black) in the wound bed. smal circular wound on the (Active)  05/01/20 0230  Location: Ankle  Location Orientation: Left;Lateral  Staging: Unstageable - Full thickness tissue loss in which the base of the injury is covered by slough (yellow, tan, gray, green or brown) and/or eschar (tan, brown or black) in the wound bed.  Wound Description (Comments): smal circular wound on the bone  Present on Admission: Yes     Pressure Injury 05/01/20 Foot Left;Posterior Unstageable - Full thickness tissue loss in which the base of the injury is covered by slough (yellow, tan, gray, green or brown) and/or eschar (tan, brown or black) in the wound bed. 4th and 5th toes appear n (Active)  05/01/20 0230  Location: Foot (4th and 5th toes and lateral/anterior side of the sole)  Location Orientation: Left;Posterior  Staging: Unstageable - Full thickness tissue loss in which the base of the injury is covered by slough (yellow, tan, gray, green or brown) and/or eschar (tan, brown or black) in the wound bed.  Wound Description (Comments): 4th and 5th toes appear necrotic circular wound on sole  Present on Admission: Yes     Pressure Injury 05/01/20 Foot Right;Medial;Posterior Unstageable - Full thickness tissue loss in which the base of the injury is covered by slough (yellow, tan, gray, green or brown) and/or eschar (tan, brown or black) in the wound bed. large circular wo (Active)  05/01/20 0230  Location:  Foot  Location Orientation: Right;Medial;Posterior  Staging: Unstageable - Full thickness tissue loss in which the base of the injury is covered by slough (yellow, tan, gray, green or brown) and/or eschar (tan, brown or black) in the wound bed.  Wound Description (Comments): large circular wound on sole  and heel  Present on Admission: Yes     Pressure Injury 05/08/20 Heel Right Unstageable - Full thickness tissue loss in which the base of the injury is  covered by slough (yellow, tan, gray, green or brown) and/or eschar (tan, brown or black) in the wound bed. (Active)  05/08/20 1718  Location: Heel  Location Orientation: Right  Staging: Unstageable - Full thickness tissue loss in which the base of the injury is covered by slough (yellow, tan, gray, green or brown) and/or eschar (tan, brown or black) in the wound bed.  Wound Description (Comments):   Present on Admission: Yes    Physical Exam: Vital Signs Blood pressure 119/85, pulse 94, temperature 98.2 F (36.8 C), temperature source Oral, resp. rate 19, height 6' 6"  (1.981 m), weight (!) 155.3 kg, SpO2 98 %.   Physical Exam  General: laying supine in bed; looks tired- denied this, NAD Mood and affect are more flat, no intonation changes at all Heart: RRR Lungs: CTA B/L- no W/R/R- good air movement Abdomen: Soft, NT, ND, (+)BS  Extremities: No clubbing, cyanosis, or edema  Skin:    Comments: Sacral decubitus as well as bilateral heel dressings are in place - beefy red- 1 is 3cm deep  Neurological: Ox3    Comments: Patient is alert in no acute distress. 5/5 strength throughout.     Assessment/Plan: 1. Functional deficits which require 3+ hours per day of interdisciplinary therapy in a comprehensive inpatient rehab setting.  Physiatrist is providing close team supervision and 24 hour management of active medical problems listed below.  Physiatrist and rehab team continue to assess barriers to discharge/monitor patient progress toward functional and medical goals  Care Tool:  Bathing    Body parts bathed by patient: Right arm,Left arm,Chest,Abdomen,Right upper leg,Left upper leg,Face   Body parts bathed by helper: Right lower leg,Left lower leg,Buttocks     Bathing assist Assist Level: Minimal Assistance - Patient > 75% (declined LB today, EOB level)     Upper Body Dressing/Undressing Upper body dressing   What is the patient wearing?: Pull over shirt    Upper  body assist Assist Level: Contact Guard/Touching assist    Lower Body Dressing/Undressing Lower body dressing      What is the patient wearing?: Pants     Lower body assist Assist for lower body dressing: Minimal Assistance - Patient > 75% (bed level)     Toileting Toileting    Toileting assist Assist for toileting: Maximal Assistance - Patient 25 - 49% Assistive Device Comment:  (urinal)   Transfers Chair/bed transfer  Transfers assist  Chair/bed transfer activity did not occur: Safety/medical concerns (Per nursing report)  Chair/bed transfer assist level: Moderate Assistance - Patient 50 - 74% Chair/bed transfer assistive device: Sliding board   Locomotion Ambulation   Ambulation assist   Ambulation activity did not occur: Safety/medical concerns (fatigue, lightheadedness, generalized weakness)          Walk 10 feet activity   Assist  Walk 10 feet activity did not occur: Safety/medical concerns (fatigue, lightheadedness, generalized weakness)        Walk 50 feet activity   Assist Walk 50 feet with 2 turns activity did not occur:  Safety/medical concerns (fatigue, lightheadedness, generalized weakness)         Walk 150 feet activity   Assist Walk 150 feet activity did not occur: Safety/medical concerns (fatigue, lightheadedness, generalized weakness)         Walk 10 feet on uneven surface  activity   Assist Walk 10 feet on uneven surfaces activity did not occur: Safety/medical concerns (fatigue, lightheadedness, generalized weakness)         Wheelchair     Assist Will patient use wheelchair at discharge?: Yes Type of Wheelchair: Manual    Wheelchair assist level: Supervision/Verbal cueing Max wheelchair distance: 100    Wheelchair 50 feet with 2 turns activity    Assist        Assist Level: Supervision/Verbal cueing   Wheelchair 150 feet activity     Assist      Assist Level: Minimal Assistance - Patient >  75%   Blood pressure 119/85, pulse 94, temperature 98.2 F (36.8 C), temperature source Oral, resp. rate 19, height 6' 6"  (1.981 m), weight (!) 155.3 kg, SpO2 98 %.  Medical Problem List and Plan: 1.  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 2.  Antithrombotics: -DVT/anticoagulation: Subcutaneous heparin.  Check vascular study             -antiplatelet therapy: N/A 3. Pain Management: Tylenol as needed  1/26- pain controlled- con't regimen 4. Mood: Provide emotional support             -antipsychotic agents: N/A 5. Neuropsych: This patient is? capable of making decisions on his own behalf.  1/17- pt appeared slightly confused but amiable this AM- will monitor  1/20- more with it this AM  1/27- appears Ox3 this AM 6. Skin/Wound Care: Routine skin checks 7. Fluids/Electrolytes/Nutrition: Routine in and outs with follow-up chemistries 8.  End-stage renal disease.  Hemodialysis per renal services 9.  Sacral decubitus ulcer as well as ischemic ulcers on the left foot fourth toe and fifth metatarsal head and right heel and fifth metatarsal head.  Wound care nurse follow-up.  Patient to see Dr. Sharol Given outpatient.  Continue bilateral PRAFO's. 10.  ID/ coccygeal osteomyelitis.  Continue vancomycin and cefepime through 06/10/2020  1/17- WBC 8.1  1/18- will schedule turning q2 hours to get off wounds- also will make sure on specialty bed  1/20- is on specialty bed  1/27- off IV ABX for osteomyletis- WBC slightly elevated at 11.9- will check CRP and ESR for baseline?  1/28- WBC down to 9.5- CRP pending, but ESR is 30- which is great considering wounds 10.  Anemia of chronic disease.  Continue Aranesp. Hgb 8.8 on 1/16, repeat with HD  1/17- no CBC today, only BMP- will try and check with HD on Tuesday.   1/19- doing better based on labs yesterday  1/26- Hb 9.8- con't regimen 11.  Diabetes  mellitus.  Hemoglobin A1c 5.6.  Lantus insulin 14 units nightly  1/22- seen by Diabetes coordinator they want me to decrease Lantus to 8 units, however Bgs running 86-120- which is ideal- will wait to decrease esp with all his wounds-  CBG (last 3)  Recent Labs    06/17/20 1707 06/17/20 2110 06/18/20 0539  GLUCAP 122* 130* 118*  1/25- BGs overall controlled- con't regimen 1/26- BGs 114-193- labile- con't regimen- since was doing great til today  1/27-  BGs 100-181- doing better- con't regimen  1/27- BGs very well controlled in last 24 hours- con't regimen 12.  Morbid obesity.  BMI 38.37.  Dietary follow-up  13. Dizziness/nausea with HTN  1/17- Sx's so significant, vomited this AM when got up- will try abd binder and ACE wraps for LEs- since due to osteomyelitis, don't think can use TEDs- if need be, can add Midodrine, but due to ESRD, cannot start Florinef.  1/18- BP laying is 164/101 this AM- might not do well with midodrine?  1/19- since not clear if actually orthostatic, per renal, will order daily orthostatics x7 days.   1/20- BP 177/108 this AM- used to be on Metoprolol and higher dose of  Norvasc- will give 1x time Metoprolol 12.5 mg since BP so high AND pulse is in 80s  1/23- stating to think he's so used to really high BP, so when comes down to more normal level, he's hypotensive.   1/25- BP slightly elevated- when c/o dizziness, BP not lower- but Sx's are improving- con't to monitor  1/27- will order hydralazine 25 mg PO q6 hours prn for SBP >180 or DBP >110- really think dizziness is "functional orthostasis" since his body is used to such high BPs   1/28- BP better this AM- 119/85- con't current regimen 14. Diarrhea with new constipation  1/18- LBM 3 days ago- on imodium 2 mg q4 hours around the clock- will change to q8 hours to see if can get him to have BM.  1/19- wil give Sorbitol 60 ml today since no BM- maintain lower dose of imodium per pt request.  1/20- thinks sorbitol made  him sick- it's actually the ileus that made him sick- refusing NG tube- will give Suppository per pt request (KUB shows likely ileus) and changed diet to clear liquid diet for now. .   Pt states he was on 3 stool softeners on acute care which worked for him  Will start senna S 2 po BID   1/25- restarted miralax daily- pt scared of loose stools, but said will "try it"".  1/26- had 2 large Bms and 1 tiny BM- finally working- con't regimen-don't stop miralax/sorbitol right now.  1.27- 2-3 more BMs yesterday- con't regimen- looking better  15. Leukocytosis  1/26- will check CXR since WBC up to 11.9- Tm 99.1- not elevated.   1/27- CXR showed minimal atalectasis- will monitor- will also recheck labs in AM with ESR/CRP due to wounds.   1/28- as above   LOS: 12 days A FACE TO FACE EVALUATION WAS PERFORMED  Clista Rainford 06/18/2020, 8:41 AM

## 2020-06-18 NOTE — Progress Notes (Signed)
Occupational Therapy Session Note  [name] MRN: [id number] Date of Birth: Oct 23, 1970  Today's Date: 06/18/2020 OT Individual Time: 0160-1093 OT Individual Time Calculation (min): 30 min   Short Term Goals: Week 2:  OT Short Term Goal 1 (Week 2): Pt will tolerate sitting OOB for 2-3 hours OT Short Term Goal 2 (Week 2): Pt will complete LB bathing and dressing with Min A w/ AE PRN OT Short Term Goal 3 (Week 2): Pt will complete transfer to [facility] with LRAD with Min A OT Short Term Goal 4 (Week 2): Pt will perform sit to stand at [facility] with Max A of 1 consistently  Skilled Therapeutic Interventions/Progress Updates:    Pt greeted sidelying in bed and agreeable to OT treatment session focused on UB there-ex. Pt given 5 lb dowel rod and completed 3 sets of 15 chest press, straight arm raise, bicep curl, and triceps press. OT discussed pt progress, PLOF, and therapy goals with pt. Nursing to come back and do wound care. Pt left in sidelying with needs met.   Therapy Documentation Precautions:  Precautions Precautions: Fall Precaution Comments: wounds on bilat feet and L sacrum, monitor orthostatic BPs Required Braces or Orthoses: Other Brace Other Brace: abdominal binder Restrictions Weight Bearing Restrictions: No Pain:  denies pain  Therapy/Group: Individual Therapy  Valma Cava 06/18/2020, 3:18 PM

## 2020-06-18 NOTE — Progress Notes (Signed)
Occupational Therapy Session Note  [name] MRN: [id number] Date of Birth: 08-Mar-1971  Today's Date: 06/18/2020 OT Individual Time: 1100-1200 OT Individual Time Calculation (min): 60 min    Skilled Therapeutic Interventions/Progress Updates:    Pt greeted in bed, reporting full body soreness due to being in bed however not wanting anything medicinally to address. BP: 162/104 and notified RN who recommended EOB tx as pt had a hypertensive event earlier this AM, RN advising taking it easy. Pt agreeable to begin session by shaving EOB, pt completing supine<sit with setup assist. He reported "moderate" lightheadedness EOB while engaging in shaving, gentle UB stretches and UB exercises using 5# dumbbell/blue tband x10 reps 1 set, mod facilitation for the Rt shoulder at times due to ROM limitations. Tried to work on increasing tolerance of positional head changes during gentle back stretches, including during dynamic cat/cow poses. Pt reported this just made his dizziness worse. Note that throughout tx, pt limited head movements and often kept eyes closed to manage his symptoms. Pt then transitioned to supine, completed bed mobility using the headboard/bedrail as needed. We discussed importance of independently stretching in the bed to minimize muscle soreness/tightness. He remained in bed at close of session, all needs within reach. Tx focus placed on sitting up tolerance, activity tolerance, UB strengthening, and pt education.  Therapy Documentation Precautions:  Precautions Precautions: Fall Precaution Comments: wounds on bilat feet and L sacrum, monitor orthostatic BPs Required Braces or Orthoses: Other Brace Other Brace: abdominal binder Restrictions Weight Bearing Restrictions: No Vital Signs: Therapy Vitals BP: (!) 181/127 ADL: ADL Eating: Set up,Supervision/safety Where Assessed-Eating: Bed level Grooming: Supervision/safety,Setup Where Assessed-Grooming:  Sitting at sink,Wheelchair Upper Body Bathing: Minimal assistance Where Assessed-Upper Body Bathing: Wheelchair,Sitting at sink Lower Body Bathing: Moderate assistance Where Assessed-Lower Body Bathing: Bed level Upper Body Dressing: Setup,Supervision/safety Where Assessed-Upper Body Dressing: Sitting at sink,Wheelchair Lower Body Dressing: Maximal assistance Where Assessed-Lower Body Dressing: Bed level      Therapy/Group: Individual Therapy  Felix Pratt A Brette Cast 06/18/2020, 12:16 PM

## 2020-06-18 NOTE — Progress Notes (Signed)
Physical Therapy Session Note  [name] MRN: [id number] Date of Birth: 1970/09/27  Today's Date: 06/18/2020 PT Individual Time: 0915-1000 and 1415-1500 PT Individual Time Calculation (min): 45 min and 45 mins  Short Term Goals: Week 1:  PT Short Term Goal 1 (Week 1): pt will perform all bed mobility with CGA PT Short Term Goal 1 - Progress (Week 1): Not met PT Short Term Goal 2 (Week 1): Pt will transfer sit<>stand with LRAD and mod A PT Short Term Goal 2 - Progress (Week 1): Not met PT Short Term Goal 3 (Week 1): Pt will transfer bed<>chair with LRAD and min A PT Short Term Goal 3 - Progress (Week 1): Not met Week 2:  PT Short Term Goal 1 (Week 2): Pt will transfer bed<>chair with LRAD and min A PT Short Term Goal 2 (Week 2): Pt will transfer sit<>stand with LRAD and mod A PT Short Term Goal 3 (Week 2): pt will perform all bed mobility with CGA PT Short Term Goal 4 (Week 2): pt will recall pressure relief schedule and safe techniques with pressure relief PT Short Term Goal 5 (Week 2): pt will tolerate sitting OOB for 45 mins without adverse effects Week 3:     Skilled Therapeutic Interventions/Progress Updates:   session 1: pt received in bed on bed pan and requested to finish BM before starting therapy. Pt missed 15 mins of therapy 2/2 to this. Pt received In supine post BM at bed pan and reporting feeling "pretty good" and wanting to get to [facility]. PT took pt's BP and during this pt reported feeling hot, nauseous and required emesis bag to actively vomit. BP taken to be 181/127 and heart rate 109. Nursing brought to bedside. Medicated pt. Pt reqested to rest a short time in supine before moving 2/2 nausea, then directed in sitting EOB to reassess BP. Pt sat EOB SBA without sway denied dizziness but constant nausea, BP taken to be 151/111. Pt sat 5 more mins and BP retaken to be 142/86 and pt reported decreased nausea. Pt requested a couple mins to rest before  activity and BP retaken after this time to be 134/84 and abdominal binder donned total A to limit orthostatsis. Pt directed in seated BLE strengthening exercises marching, LAQ 2x20, BUE strengthening exercises 2x15 of overhead press, and chest press. Pt required extra time for rest breaks after each set 2/2 fatigue and nausea. Pt returned to supine at end of session, reported continued nausea but better than start of session. Pt left in supine, All needs in reach and in good condition. Call light in hand.  And nursing aware. Pt missed 15 mins of PT  Session 2: pt received in bed and agreeable to therapy. Pt reported feeling better than AM session, BP taken to be 142/90. Pt directed in supine>sit min A for trunk support and sat EOB for 3 mins, BP taken to be 136/86 with abdominal binder and B ace wraps donned to BLE. Pt directed in x3 Sit to stand at stedy max A to come to standing with gait belt and VC for glute activation, trunk extension, breathing technique with good effect. Pt directed in returning to bed at end of session reporting great fatigue and prolonged rest breaks post each rep and transferred to supine CGA at end of session. Pt had questions regarding wound care, nursing made aware. Pt left in bed, All needs in reach and in good condition. Call light in hand.  Therapy Documentation Precautions:  Precautions Precautions: Fall Precaution Comments: wounds on bilat feet and L sacrum, monitor orthostatic BPs Required Braces or Orthoses: Other Brace Other Brace: abdominal binder Restrictions Weight Bearing Restrictions: No General: PT Amount of Missed Time (min): 15 Minutes PT Missed Treatment Reason: Toileting (pt on bed pan and requested to finish prior to starting therapy.) Vital Signs: Therapy Vitals BP: (!) 181/127 Pain:   Mobility:   Locomotion :    Trunk/Postural Assessment :    Balance:   Exercises:   Other Treatments:      Therapy/Group: Individual  Therapy  [name] 06/18/2020, 10:48 AM

## 2020-06-18 NOTE — Progress Notes (Addendum)
[facility] Progress Note   Subjective: Seen in room. Says his blood pressure has been high but falls when he stands up. Although not labeled, staff doing orthostatic BPs. Required PRN hydralazine this AM for HTN, but now sitting on side of bed with BP 110/72 (checked per self).   Objective Vitals:   06/17/20 1710 06/17/20 2025 06/18/20 0531 06/18/20 0927  BP: (!) 143/89 (!) 153/91 119/85 (!) 181/127  Pulse: 95 98 94   Resp: '16 17 19   '$ Temp: 97.9 F (36.6 C) 98.7 F (37.1 C) 98.2 F (36.8 C)   TempSrc: Oral Oral Oral   SpO2: 100% 99% 98%   Weight:      Height:       Physical Exam General: WN, WD male in NAD Heart: S1,S2, No M/R/G Lungs: CTAB A/P Abdomen: active BS, ND, NT Extremities: No LE edema Dialysis Access: RIJ TDC Drsg intact. Exit site unremarkable.     Additional Objective Labs: Basic Metabolic Panel: Recent Labs  Lab 06/12/20 1400 06/15/20 1512 06/18/20 0020  NA 136 135 135  K 3.9 4.7 4.5  CL 99 97* 95*  CO2 '28 25 25  '$ GLUCOSE 113* 137* 124*  BUN 24* 60* 54*  CREATININE 5.19* 7.38* 6.89*  CALCIUM 8.5* 8.9 8.8*  PHOS 4.7* 5.6* 3.9   Liver Function Tests: Recent Labs  Lab 06/12/20 1400 06/15/20 1512 06/18/20 0020  ALBUMIN 1.9* 2.1* 2.1*   No results for input(s): LIPASE, AMYLASE in the last 168 hours. CBC: Recent Labs  Lab 06/12/20 1400 06/15/20 1511 06/18/20 0020  WBC 5.9 11.9* 9.4  NEUTROABS  --   --  6.5  HGB 9.7* 9.8* 9.5*  HCT 31.1* 32.3* 32.2*  MCV 85.2 86.4 88.7  PLT 221 278 252   Blood Culture    Component Value Date/Time   SDES BLOOD SITE NOT SPECIFIED 04/29/2020 1210   SPECREQUEST  04/29/2020 1210    BOTTLES DRAWN AEROBIC AND ANAEROBIC Blood Culture adequate volume   CULT  04/29/2020 1210    NO GROWTH 5 DAYS Performed at [facility], [place] [address], [place], [place] [address]    REPTSTATUS 05/04/2020 FINAL 04/29/2020 1210    Cardiac Enzymes: No results for input(s): CKTOTAL, CKMB, CKMBINDEX,  TROPONINI in the last 168 hours. CBG: Recent Labs  Lab 06/17/20 0553 06/17/20 1204 06/17/20 1707 06/17/20 2110 06/18/20 0539  GLUCAP 100* 209* 122* 130* 118*   Iron Studies: No results for input(s): IRON, TIBC, TRANSFERRIN, FERRITIN in the last 72 hours. '@lablastinr3'$ @ Studies/Results: No results found. Medications:  . (feeding supplement) PROSource Plus  30 mL Oral TID BM  . amLODipine  5 mg Oral QHS  . Chlorhexidine Gluconate Cloth  6 each Topical BID  . Chlorhexidine Gluconate Cloth  6 each Topical Q0600  . darbepoetin (ARANESP) injection - DIALYSIS  200 mcg Intravenous Q Tue-HD  . famotidine  20 mg Oral QHS  . feeding supplement (NEPRO CARB STEADY)  237 mL Oral BID BM  . heparin injection (subcutaneous)  7,500 Units Subcutaneous Q8H  . insulin aspart  0-9 Units Subcutaneous TID WC  . insulin glargine  11 Units Subcutaneous QHS  . lanthanum  500 mg Oral TID WC  . metoCLOPramide  5 mg Oral TID AC  . multivitamin  1 tablet Oral QHS  . pneumococcal 23 valent vaccine  0.5 mL Intramuscular Tomorrow-1000  . polyethylene glycol  17 g Oral Daily  . senna-docusate  2 tablet Oral BID    Dialysis Orders:  NWTTS 4h 400/800 143.5kg 3K/2.5Ca TDCHeparin3000units IV TIW - Mircera169mgIVq2wks - last 12/8 - Venofer '100mg'$ IVqHD x10 - completed 6/10 doses  Assessment/Plan: 1. Sacral decub/coccygealosteo:S/p 6 weeks ofVanc/Cefepime (finished1/20/22) - ID following. In CIR. 2. Osteomyelitis L foot/ pressure injury bilat heels: Wound care per primary team 3. ESRD:Back on usual TTS schedule, for HD tomorrow.  4. Diarrhea -Recurrent issue, thought to be d/t Renvelawhich has now been stopped but still having issues with diarrhea on Fosrenol. Decrease dose.  5. BP/volume:Patient having issues with systolic hypertension/orthostatic hypotension. Best option is to attempt to better control HTN. Still on amlodipine. Increase dose but change schedule to evening after rehab  activities and see if this helps. DC PRN hydralazine as he is not on BB.  Volume is stable but weights are trending down. Not eating well/issues with diarrhea.  UF as tolerated.  6. COVID PNA- tested positive on 11/30, is off isolation now.  7. Anemiaof CKD: Hgb 9.5,cont Aranesp 201mqSat.  8. Secondary Hyperparathyroidism:CorrCa10.2,use low Ca bath.Not on VDRA.Stoppedrenvela 2/2diarrhea, tryingFosrenol insteadPO4 at goal but still with diarrhea. Decrease dose to BID with largest meals.  9. Nutrition:Alb remains low, continue Nepro. 10. DMT2 - per admit   Yovany Clock H. Syrai Gladwin NP-C 06/18/2020, 11:12 AM  [phone]

## 2020-06-19 LAB — GLUCOSE, CAPILLARY
Glucose-Capillary: 104 mg/dL — ABNORMAL HIGH (ref 70–99)
Glucose-Capillary: 177 mg/dL — ABNORMAL HIGH (ref 70–99)
Glucose-Capillary: 116 mg/dL — ABNORMAL HIGH (ref 70–99)
Glucose-Capillary: 163 mg/dL — ABNORMAL HIGH (ref 70–99)

## 2020-06-19 LAB — RENAL FUNCTION PANEL
Albumin: 2.2 g/dL — ABNORMAL LOW (ref 3.5–5.0)
Anion gap: 11 (ref 5–15)
BUN: 50 mg/dL — ABNORMAL HIGH (ref 6–20)
CO2: 25 mmol/L (ref 22–32)
Calcium: 8.9 mg/dL (ref 8.9–10.3)
Chloride: 96 mmol/L — ABNORMAL LOW (ref 98–111)
Creatinine, Ser: 6.69 mg/dL — ABNORMAL HIGH (ref 0.61–1.24)
GFR, Estimated: 9 mL/min — ABNORMAL LOW (ref >60)
Glucose, Bld: 194 mg/dL — ABNORMAL HIGH (ref 70–99)
Phosphorus: 4.4 mg/dL (ref 2.5–4.6)
Potassium: 4.6 mmol/L (ref 3.5–5.1)
Sodium: 132 mmol/L — ABNORMAL LOW (ref 135–145)

## 2020-06-19 LAB — CBC
HCT: 33.5 % — ABNORMAL LOW (ref 39.0–52.0)
Hemoglobin: 9.9 g/dL — ABNORMAL LOW (ref 13.0–17.0)
MCH: 25.8 pg — ABNORMAL LOW (ref 26.0–34.0)
MCHC: 29.6 g/dL — ABNORMAL LOW (ref 30.0–36.0)
MCV: 87.5 fL (ref 80.0–100.0)
Platelets: 269 10*3/uL (ref 150–400)
RBC: 3.83 MIL/uL — ABNORMAL LOW (ref 4.22–5.81)
RDW: 17.1 % — ABNORMAL HIGH (ref 11.5–15.5)
WBC: 10.8 10*3/uL — ABNORMAL HIGH (ref 4.0–10.5)
nRBC: 0 % (ref 0.0–0.2)

## 2020-06-19 MED ORDER — PENTAFLUOROPROP-TETRAFLUOROETH EX AERO: 1.0000 "application " | INHALATION_SPRAY | CUTANEOUS | Status: DC | PRN

## 2020-06-19 MED ORDER — SODIUM CHLORIDE 0.9 % IV SOLN: 100.0000 mL | INTRAVENOUS | Status: DC | PRN

## 2020-06-19 MED ORDER — HEPARIN SODIUM (PORCINE) 1000 UNIT/ML DIALYSIS: 1000.0000 [IU] | INTRAMUSCULAR | Status: DC | PRN

## 2020-06-19 MED ORDER — LIDOCAINE-PRILOCAINE 2.5-2.5 % EX CREA: 1.0000 "application " | TOPICAL_CREAM | CUTANEOUS | Status: DC | PRN

## 2020-06-19 MED ORDER — LIDOCAINE HCL (PF) 1 % IJ SOLN: 5.0000 mL | INTRAMUSCULAR | Status: DC | PRN

## 2020-06-19 MED ORDER — ALTEPLASE 2 MG IJ SOLR: 2.0000 mg | Freq: Once | INTRAMUSCULAR | Status: DC | PRN

## 2020-06-19 MED ORDER — HEPARIN SODIUM (PORCINE) 1000 UNIT/ML IJ SOLN
INTRAMUSCULAR | Status: AC
  Filled 2020-06-19: qty 4

## 2020-06-19 MED ORDER — HEPARIN SODIUM (PORCINE) 1000 UNIT/ML DIALYSIS: 3000.0000 [IU] | INTRAMUSCULAR | Status: DC | PRN

## 2020-06-19 NOTE — Progress Notes (Signed)
Physical Therapy Session Note  [name] MRN: [id number] Date of Birth: 21-Aug-1970  Today's Date: 06/19/2020 PT Individual Time: 0902-1000 PT Individual Time Calculation (min): 58 min   Short Term Goals: Week 2:  PT Short Term Goal 1 (Week 2): Pt will transfer bed<>chair with LRAD and min A PT Short Term Goal 2 (Week 2): Pt will transfer sit<>stand with LRAD and mod A PT Short Term Goal 3 (Week 2): pt will perform all bed mobility with CGA PT Short Term Goal 4 (Week 2): pt will recall pressure relief schedule and safe techniques with pressure relief PT Short Term Goal 5 (Week 2): pt will tolerate sitting OOB for 45 mins without adverse effects Week 3:     Skilled Therapeutic Interventions/Progress Updates:    Patient sidelying to L upon PT arrival. Patient alert and agreeable to PT session. Patient denied pain during session. No complaint of nausea, light complaints of dizziness during session.  Therapeutic Activity: Bed Mobility: Prior to bed mobility, BP in supine at 128/ 77. Patient performed supine to/from sit with SBA and maintains sitting EOB with SBA. VC provided for technique. BP in seated position at 130/ 87 with light c/o dizziness that resolves quickly.  Transfers: Patient attempted STS x4  From bed to [facility]. Requires Max A for all attempts. Unable to reach full upright stand and completing 25% lift from seat with first 2 trials. Elevated bed height with 25-50% lift in next 2 trials. Final attempt with bed surface elevated higher, required Max A, extra time, max encouragement and max effort Upright standing maintained for ~30sec and pt moves to sit stating dizziness. Attempted to take BP reading in standing with pt unable to maintain long enough. Final BP reading just after sitting taken at 109/ 65. Dizziness resolved quickly following move to seated position.  Therapeutic Exercise: Patient performed the following exercises with verbal and tactile cues  for proper technique: Supine knee extension from hooklying position. 1x5 bilaterally with light PT resistance, 2x5 AROM. Bridging 2x5 AROM. Glute and quad sets x5 each. Pt instructed to perform at least x10 knee extension and x10 quad sets with 10sec holds with one set between breakfast and lunch, and on set between lunch and dinner each day to assist with BLE strengthening.   Patient supine in bed at end of session with brakes locked and all needs within reach. Pt able to position self to side and reminded to turn to other side with alarm on air mattress set to sound in 2 hrs.   Therapy Documentation Precautions:  Precautions Precautions: Fall Precaution Comments: wounds on bilat feet and L sacrum, monitor orthostatic BPs Required Braces or Orthoses: Other Brace Other Brace: abdominal binder Restrictions Weight Bearing Restrictions: No  Therapy/Group: Individual Therapy  Alger Simons PT, DPT 06/19/2020, 1:49 PM

## 2020-06-19 NOTE — Progress Notes (Signed)
Occupational Therapy Session Note  [name] MRN: [id number] Date of Birth: [birth year]  Today's Date: 06/19/2020 OT Individual Time: 1015-1056 OT Individual Time Calculation (min): 41 min   Short Term Goals: Week 2:  OT Short Term Goal 1 (Week 2): Pt will tolerate sitting OOB for 2-3 hours OT Short Term Goal 2 (Week 2): Pt will complete LB bathing and dressing with Min A w/ AE PRN OT Short Term Goal 3 (Week 2): Pt will complete transfer to [facility] with LRAD with Min A OT Short Term Goal 4 (Week 2): Pt will perform sit to stand at [facility] with Max A of 1 consistently     Skilled Therapeutic Interventions/Progress Updates:    Pt greeted in bed with no c/o pain, motivated to try to stand up. Supine BP: 137/85, so OT first donned B LE ACE wraps and then when he sat up abdominal binder donned for BP mgt. Sit<stand in bariatric Stedy completed with Max A, bed height significantly elevated (seated BP beforehand 125/82 with "mild" dizziness). Note that OT lowered Stedy paddles and when pt returned to sitting semi perched on [place] he had 1 posterior LOB that appeared very controlled during descent back to the bed using bedrail. He stood for ~9 seconds, needed to maintain fixed gaze on 1 point on the floor to self manage dizziness. Per pt, PT does not lower Stedy paddles during session. OT explained to pt that to progress to transferring/OOB toileting using Stedy, he must be able to tolerate semi perched position on [place]. Post stand BP 170/97 with "mod-max" dizziness, dropping to 154/97 after 4 minutes with pt reporting "min-mod" dizziness. Tried to stand x2 again but pt unable to lift buttocks off of the mattress with Max A of 1. He returned to bed where compression garments were doffed due to hypertension at rest. Mild or "low" dizziness reported at this time. He was left in care of RN for dressing changes on foot wound.   Therapy Documentation Precautions:  Precautions Precautions:  Fall Precaution Comments: wounds on bilat feet and L sacrum, monitor orthostatic BPs Required Braces or Orthoses: Other Brace Other Brace: abdominal binder Restrictions Weight Bearing Restrictions: No ADL: ADL Eating: Set up,Supervision/safety Where Assessed-Eating: Bed level Grooming: Supervision/safety,Setup Where Assessed-Grooming: Sitting at sink,Wheelchair Upper Body Bathing: Minimal assistance Where Assessed-Upper Body Bathing: Wheelchair,Sitting at sink Lower Body Bathing: Moderate assistance Where Assessed-Lower Body Bathing: Bed level Upper Body Dressing: Setup,Supervision/safety Where Assessed-Upper Body Dressing: Sitting at sink,Wheelchair Lower Body Dressing: Maximal assistance Where Assessed-Lower Body Dressing: Bed level :     Therapy/Group: Individual Therapy  Pam Vanalstine A Chancellor Vanderloop 06/19/2020, 12:52 PM

## 2020-06-19 NOTE — Progress Notes (Signed)
[facility] Progress Note   Subjective: BP better controlled today. Did better with therapy after medication changes. For HD later today. No C/Os.     Objective Vitals:   06/18/20 1334 06/18/20 1855 06/18/20 2055 06/19/20 0543  BP: 129/87 130/78 (!) 154/95 (!) 159/94  Pulse: 95  92 88  Resp: '16  16 18  '$ Temp: 99 F (37.2 C)  98.3 F (36.8 C) 98.1 F (36.7 C)  TempSrc: Oral     SpO2: 100%  98% 100%  Weight:      Height:       Physical Exam General: WN, WD male in NAD Heart: S1,S2, No M/R/G Lungs: CTAB A/P Abdomen: active BS, ND, NT Extremities: No LE edema Dialysis Access: RIJ TDC Drsg intact. Exit site unremarkable.    Additional Objective Labs: Basic Metabolic Panel: Recent Labs  Lab 06/12/20 1400 06/15/20 1512 06/18/20 0020  NA 136 135 135  K 3.9 4.7 4.5  CL 99 97* 95*  CO2 '28 25 25  '$ GLUCOSE 113* 137* 124*  BUN 24* 60* 54*  CREATININE 5.19* 7.38* 6.89*  CALCIUM 8.5* 8.9 8.8*  PHOS 4.7* 5.6* 3.9   Liver Function Tests: Recent Labs  Lab 06/12/20 1400 06/15/20 1512 06/18/20 0020  ALBUMIN 1.9* 2.1* 2.1*   No results for input(s): LIPASE, AMYLASE in the last 168 hours. CBC: Recent Labs  Lab 06/12/20 1400 06/15/20 1511 06/18/20 0020  WBC 5.9 11.9* 9.4  NEUTROABS  --   --  6.5  HGB 9.7* 9.8* 9.5*  HCT 31.1* 32.3* 32.2*  MCV 85.2 86.4 88.7  PLT 221 278 252   Blood Culture    Component Value Date/Time   SDES BLOOD SITE NOT SPECIFIED 04/29/2020 1210   SPECREQUEST  04/29/2020 1210    BOTTLES DRAWN AEROBIC AND ANAEROBIC Blood Culture adequate volume   CULT  04/29/2020 1210    NO GROWTH 5 DAYS Performed at [facility], [place] [address], [place], [place] [address]    REPTSTATUS 05/04/2020 FINAL 04/29/2020 1210    Cardiac Enzymes: No results for input(s): CKTOTAL, CKMB, CKMBINDEX, TROPONINI in the last 168 hours. CBG: Recent Labs  Lab 06/18/20 1218 06/18/20 1639 06/18/20 2104 06/19/20 0717 06/19/20 1127  GLUCAP 137*  171* 161* 104* 177*   Iron Studies: No results for input(s): IRON, TIBC, TRANSFERRIN, FERRITIN in the last 72 hours. '@lablastinr3'$ @ Studies/Results: No results found. Medications:  . (feeding supplement) PROSource Plus  30 mL Oral TID BM  . amLODipine  10 mg Oral QHS  . Chlorhexidine Gluconate Cloth  6 each Topical BID  . Chlorhexidine Gluconate Cloth  6 each Topical Q0600  . darbepoetin (ARANESP) injection - DIALYSIS  200 mcg Intravenous Q Tue-HD  . famotidine  20 mg Oral QHS  . feeding supplement (NEPRO CARB STEADY)  237 mL Oral BID BM  . heparin injection (subcutaneous)  7,500 Units Subcutaneous Q8H  . insulin aspart  0-9 Units Subcutaneous TID WC  . insulin glargine  11 Units Subcutaneous QHS  . lanthanum  500 mg Oral TID WC  . metoCLOPramide  5 mg Oral TID AC  . multivitamin  1 tablet Oral QHS  . pneumococcal 23 valent vaccine  0.5 mL Intramuscular Tomorrow-1000  . polyethylene glycol  17 g Oral Daily  . senna-docusate  2 tablet Oral BID     Dialysis Orders: NWTTS 4h 400/800 143.5kg 3K/2.5Ca TDCHeparin3000units IV TIW - Mircera166mgIVq2wks - last 12/8 - Venofer '100mg'$ IVqHD x10 - completed 6/10 doses  Assessment/Plan: 1. Sacral  decub/coccygealosteo:S/p 6 weeks ofVanc/Cefepime (finished1/20/22) - ID following. In CIR. 2. 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 ESRD:Back on usual TTS schedule, for HD 06/19/20. 4. Diarrhea -Recurrent issue, thought to be d/t Renvelawhich has now been stopped but still having issues with diarrhea on Fosrenol. Decrease dose.  5. BP/volume:Patient having issues with systolic hypertension/orthostatic hypotension. Best option is to attempt to better control HTN. Still on amlodipine. Increased dose but changed schedule to evening after rehab activities which seems to have helped. DC PRN hydralazine as he is not on BB.  Volume is stable but weights are trending down. Not eating  well/issues with diarrhea.  UF as tolerated. Lower EDW appropriately.  6. COVID PNA- tested positive on 11/30, is off isolation now.  7. Anemiaof CKD: Hgb 9.5,cont Aranesp 247mgqSat.  8. Secondary Hyperparathyroidism:CorrCa10.2,use low Ca bath.Not on VDRA.Stoppedrenvela 2/2diarrhea, tryingFosrenol insteadPO4 at goal but still with diarrhea. Decrease dose to BID with largest meals. PO4 at goal.  9. Nutrition:Alb remains low, continue Nepro. 10. DMT2 - per admit   Byron Peacock H. Zayne Marovich NP-C 06/19/2020, 12:55 PM  [phone]

## 2020-06-19 NOTE — Progress Notes (Signed)
PHYSICAL MEDICINE & REHABILITATION PROGRESS NOTE   Subjective/Complaints:   Ready for breakfast , no problems overnight , therapy went well yesterday   ROS: Pt denies SOB, abd pain, CP, N/V/C/D, and vision changes, chronic numbness of feet    Objective:   No results found. Recent Labs    06/18/20 0020  WBC 9.4  HGB 9.5*  HCT 32.2*  PLT 252   Recent Labs    06/18/20 0020  NA 135  K 4.5  CL 95*  CO2 25  GLUCOSE 124*  BUN 54*  CREATININE 6.89*  CALCIUM 8.8*    Intake/Output Summary (Last 24 hours) at 06/19/2020 8315 Last data filed at 06/18/2020 2055 Gross per 24 hour  Intake 780 ml  Output -  Net 780 ml     Pressure Injury 05/01/20 Buttocks Left Stage 4 - Full thickness tissue loss with exposed bone, tendon or muscle. packed with santyl gauze, dressed by WOCN, (Active)  05/01/20 0230  Location: Buttocks  Location Orientation: Left  Staging: Stage 4 - Full thickness tissue loss with exposed bone, tendon or muscle.  Wound Description (Comments): packed with santyl gauze, dressed by WOCN,  Present on Admission: Yes     Pressure Injury 05/01/20 Ankle Left;Lateral Unstageable - Full thickness tissue loss in which the base of the injury is covered by slough (yellow, tan, gray, green or brown) and/or eschar (tan, brown or black) in the wound bed. smal circular wound on the (Active)  05/01/20 0230  Location: Ankle  Location Orientation: Left;Lateral  Staging: Unstageable - Full thickness tissue loss in which the base of the injury is covered by slough (yellow, tan, gray, green or brown) and/or eschar (tan, brown or black) in the wound bed.  Wound Description (Comments): smal circular wound on the bone  Present on Admission: Yes     Pressure Injury 05/01/20 Foot Left;Posterior Unstageable - Full thickness tissue loss in which the base of the injury is covered by slough (yellow, tan, gray, green or brown) and/or eschar (tan, brown or black) in the wound bed.  4th and 5th toes appear n (Active)  05/01/20 0230  Location: Foot (4th and 5th toes and lateral/anterior side of the sole)  Location Orientation: Left;Posterior  Staging: Unstageable - Full thickness tissue loss in which the base of the injury is covered by slough (yellow, tan, gray, green or brown) and/or eschar (tan, brown or black) in the wound bed.  Wound Description (Comments): 4th and 5th toes appear necrotic circular wound on sole  Present on Admission: Yes     Pressure Injury 05/01/20 Foot Right;Medial;Posterior Unstageable - Full thickness tissue loss in which the base of the injury is covered by slough (yellow, tan, gray, green or brown) and/or eschar (tan, brown or black) in the wound bed. large circular wo (Active)  05/01/20 0230  Location: Foot  Location Orientation: Right;Medial;Posterior  Staging: Unstageable - Full thickness tissue loss in which the base of the injury is covered by slough (yellow, tan, gray, green or brown) and/or eschar (tan, brown or black) in the wound bed.  Wound Description (Comments): large circular wound on sole  and heel  Present on Admission: Yes     Pressure Injury 05/08/20 Heel Right Unstageable - Full thickness tissue loss in which the base of the injury is covered by slough (yellow, tan, gray, green or brown) and/or eschar (tan, brown or black) in the wound bed. (Active)  05/08/20 1718  Location: Heel  Location Orientation: Right  Staging: Unstageable - Full thickness tissue loss in which the base of the injury is covered by slough (yellow, tan, gray, green or brown) and/or eschar (tan, brown or black) in the wound bed.  Wound Description (Comments):   Present on Admission: Yes    Physical Exam: Vital Signs Blood pressure (!) 159/94, pulse 88, temperature 98.1 F (36.7 C), resp. rate 18, height 6' 6"  (1.981 m), weight (!) 155.3 kg, SpO2 100 %.   Physical Exam   General: No acute distress Mood and affect are appropriate Heart: Regular  rate and rhythm no rubs murmurs or extra sounds Lungs: Clear to auscultation, breathing unlabored, no rales or wheezes Abdomen: Positive bowel sounds, soft nontender to palpation, nondistended Extremities: No clubbing, cyanosis, or edema Skin: No evidence of breakdown, no evidence of rash     Neurological: Ox3    Comments: Patient is alert in no acute distress. 5/5 strength throughout.     Assessment/Plan: 1. Functional deficits which require 3+ hours per day of interdisciplinary therapy in a comprehensive inpatient rehab setting.  Physiatrist is providing close team supervision and 24 hour management of active medical problems listed below.  Physiatrist and rehab team continue to assess barriers to discharge/monitor patient progress toward functional and medical goals  Care Tool:  Bathing    Body parts bathed by patient: Right arm,Left arm,Chest,Abdomen,Right upper leg,Left upper leg,Face   Body parts bathed by helper: Right lower leg,Left lower leg,Buttocks     Bathing assist Assist Level: Minimal Assistance - Patient > 75% (declined LB today, EOB level)     Upper Body Dressing/Undressing Upper body dressing   What is the patient wearing?: Pull over shirt    Upper body assist Assist Level: Contact Guard/Touching assist    Lower Body Dressing/Undressing Lower body dressing      What is the patient wearing?: Pants     Lower body assist Assist for lower body dressing: Minimal Assistance - Patient > 75% (bed level)     Toileting Toileting    Toileting assist Assist for toileting: Maximal Assistance - Patient 25 - 49% Assistive Device Comment:  (urinal)   Transfers Chair/bed transfer  Transfers assist  Chair/bed transfer activity did not occur: Safety/medical concerns (Per nursing report)  Chair/bed transfer assist level: Moderate Assistance - Patient 50 - 74% Chair/bed transfer assistive device: Sliding board   Locomotion Ambulation   Ambulation  assist   Ambulation activity did not occur: Safety/medical concerns (fatigue, lightheadedness, generalized weakness)          Walk 10 feet activity   Assist  Walk 10 feet activity did not occur: Safety/medical concerns (fatigue, lightheadedness, generalized weakness)        Walk 50 feet activity   Assist Walk 50 feet with 2 turns activity did not occur: Safety/medical concerns (fatigue, lightheadedness, generalized weakness)         Walk 150 feet activity   Assist Walk 150 feet activity did not occur: Safety/medical concerns (fatigue, lightheadedness, generalized weakness)         Walk 10 feet on uneven surface  activity   Assist Walk 10 feet on uneven surfaces activity did not occur: Safety/medical concerns (fatigue, lightheadedness, generalized weakness)         Wheelchair     Assist Will patient use wheelchair at discharge?: Yes Type of Wheelchair: Manual    Wheelchair assist level: Supervision/Verbal cueing Max wheelchair distance: 100    Wheelchair 50 feet with 2 turns activity    Assist  Assist Level: Supervision/Verbal cueing   Wheelchair 150 feet activity     Assist      Assist Level: Minimal Assistance - Patient > 75%   Blood pressure (!) 159/94, pulse 88, temperature 98.1 F (36.7 C), resp. rate 18, height 6' 6"  (1.981 m), weight (!) 155.3 kg, SpO2 100 %.  Medical Problem List and Plan: 1.  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 2.  Antithrombotics: -DVT/anticoagulation: Subcutaneous heparin.  Check vascular study             -antiplatelet therapy: N/A 3. Pain Management: Tylenol as needed  1/26- pain controlled- con't regimen 4. Mood: Provide emotional support             -antipsychotic agents: N/A 5. Neuropsych: This patient is? capable of making decisions on his own behalf.  1/17- pt appeared slightly  confused but amiable this AM- will monitor  1/20- more with it this AM  1/27- appears Ox3 this AM 6. Skin/Wound Care: Routine skin checks 7. Fluids/Electrolytes/Nutrition: Routine in and outs with follow-up chemistries 8.  End-stage renal disease.  Hemodialysis per renal services 9.  Sacral decubitus ulcer as well as ischemic ulcers on the left foot fourth toe and fifth metatarsal head and right heel and fifth metatarsal head.  Wound care nurse follow-up.  Patient to see Dr. Sharol Given outpatient.  Continue bilateral PRAFO's. 10.  ID/ coccygeal osteomyelitis.  Continue vancomycin and cefepime through 06/10/2020  1/17- WBC 8.1  1/18- will schedule turning q2 hours to get off wounds- also will make sure on specialty bed  1/20- is on specialty bed  1/27- off IV ABX for osteomyletis- WBC slightly elevated at 11.9- will check CRP and ESR for baseline?  1/28- WBC down to 9.5- CRP pending, but ESR is 30- which is great considering wounds 10.  Anemia of chronic disease.  Continue Aranesp. Hgb 8.8 on 1/16, repeat with HD  1/17- no CBC today, only BMP- will try and check with HD on Tuesday.   1/19- doing better based on labs yesterday  1/26- Hb 9.8- con't regimen 11.  Diabetes mellitus with peripheral neuropathy .  Hemoglobin A1c 5.6.  Lantus insulin 14 units nightly  1/22- seen by Diabetes coordinator they want me to decrease Lantus to 8 units, however Bgs running 86-120- which is ideal- will wait to decrease esp with all his wounds-  CBG (last 3)  Recent Labs    06/18/20 1218 06/18/20 1639 06/18/20 2104  GLUCAP 137* 171* 161*  1/29- BGs overall controlled- con't regimen  12.  Morbid obesity.  BMI 38.37.  Dietary follow-up  13. Dizziness/nausea with HTN  1/17- Sx's so significant, vomited this AM when got up- will try abd binder and ACE wraps for LEs- since due to osteomyelitis, don't think can use TEDs- if need be, can add Midodrine, but due to ESRD, cannot start Florinef.  1/18- BP laying is 164/101  this AM- might not do well with midodrine?  1/19- since not clear if actually orthostatic, per renal, will order daily orthostatics x7 days.   1/20- BP 177/108 this AM- used to be on Metoprolol and higher dose of  Norvasc- will give 1x time Metoprolol 12.5 mg since BP so high AND pulse is in 80s  1/23- stating to think he's so used to really high BP, so when comes down to more normal level, he's  hypotensive.   1/25- BP slightly elevated- when c/o dizziness, BP not lower- but Sx's are improving- con't to monitor  1/27- will order hydralazine 25 mg PO q6 hours prn for SBP >180 or DBP >110- really think dizziness is "functional orthostasis" since his body is used to such high BPs    Vitals:   06/18/20 2055 06/19/20 0543  BP: (!) 154/95 (!) 159/94  Pulse: 92 88  Resp: 16 18  Temp: 98.3 F (36.8 C) 98.1 F (36.7 C)  SpO2: 98% 100%  some lability although no need for prn hydralazine over the last 24h 14. Diarrhea with new constipation  1/18- LBM 3 days ago- on imodium 2 mg q4 hours around the clock- will change to q8 hours to see if can get him to have BM.  1/19- wil give Sorbitol 60 ml today since no BM- maintain lower dose of imodium per pt request.  1/20- thinks sorbitol made him sick- it's actually the ileus that made him sick- refusing NG tube- will give Suppository per pt request (KUB shows likely ileus) and changed diet to clear liquid diet for now. .   Pt states he was on 3 stool softeners on acute care which worked for him  Will start senna S 2 po BID   1/25- restarted miralax daily- pt scared of loose stools, but said will "try it"".  1/26- had 2 large Bms and 1 tiny BM- finally working- con't regimen-don't stop miralax/sorbitol right now.  1.27- 2-3 more BMs yesterday- con't regimen- looking better  15. Leukocytosis  1/26- will check CXR since WBC up to 11.9- Tm 99.1- not elevated.   1/27- CXR showed minimal atalectasis- will monitor- will also recheck labs in AM with ESR/CRP due to  wounds.   sedrate mild elevation but unchanged vs 1 mo ago    LOS: 13 days A FACE TO FACE EVALUATION WAS PERFORMED  Charlett Blake 06/19/2020, 6:29 AM

## 2020-06-20 LAB — GLUCOSE, CAPILLARY
Glucose-Capillary: 112 mg/dL — ABNORMAL HIGH (ref 70–99)
Glucose-Capillary: 161 mg/dL — ABNORMAL HIGH (ref 70–99)
Glucose-Capillary: 150 mg/dL — ABNORMAL HIGH (ref 70–99)
Glucose-Capillary: 214 mg/dL — ABNORMAL HIGH (ref 70–99)

## 2020-06-20 NOTE — Progress Notes (Signed)
[facility] Progress Note   Subjective: Seen in room. No issues with HD 01/29. Net UF 2 L. Better control of HTN. No C/Os.   Objective Vitals:   06/19/20 1716 06/19/20 1832 06/19/20 2012 06/20/20 0424  BP: (!) 138/98 (!) 136/99 127/83 127/89  Pulse: 90 95 97 98  Resp: '17 16 18 18  '$ Temp: 98.2 F (36.8 C) 98 F (36.7 C) 98.5 F (36.9 C) 98.7 F (37.1 C)  TempSrc: Oral  Oral   SpO2: 99% 99% 99% 99%  Weight:    (!) 153.8 kg  Height:       Physical Exam General:WN, WD male in NAD Heart:S1,S2, No M/R/G Lungs:CTAB A/P Abdomen:active BS, ND, NT Extremities:No LE edema Dialysis Access:RIJ TDC Drsg intact. Exit site unremarkable.   Additional Objective Labs: Basic Metabolic Panel: Recent Labs  Lab 06/15/20 1512 06/18/20 0020 06/19/20 1541  NA 135 135 132*  K 4.7 4.5 4.6  CL 97* 95* 96*  CO2 '25 25 25  '$ GLUCOSE 137* 124* 194*  BUN 60* 54* 50*  CREATININE 7.38* 6.89* 6.69*  CALCIUM 8.9 8.8* 8.9  PHOS 5.6* 3.9 4.4   Liver Function Tests: Recent Labs  Lab 06/15/20 1512 06/18/20 0020 06/19/20 1541  ALBUMIN 2.1* 2.1* 2.2*   No results for input(s): LIPASE, AMYLASE in the last 168 hours. CBC: Recent Labs  Lab 06/15/20 1511 06/18/20 0020 06/19/20 1541  WBC 11.9* 9.4 10.8*  NEUTROABS  --  6.5  --   HGB 9.8* 9.5* 9.9*  HCT 32.3* 32.2* 33.5*  MCV 86.4 88.7 87.5  PLT 278 252 269   Blood Culture    Component Value Date/Time   SDES BLOOD SITE NOT SPECIFIED 04/29/2020 1210   SPECREQUEST  04/29/2020 1210    BOTTLES DRAWN AEROBIC AND ANAEROBIC Blood Culture adequate volume   CULT  04/29/2020 1210    NO GROWTH 5 DAYS Performed at [facility], [place] [address] [place], [place] [address]    REPTSTATUS 05/04/2020 FINAL 04/29/2020 1210    Cardiac Enzymes: No results for input(s): CKTOTAL, CKMB, CKMBINDEX, TROPONINI in the last 168 hours. CBG: Recent Labs  Lab 06/19/20 1127 06/19/20 1825 06/19/20 2053 06/20/20 0624 06/20/20 1130   GLUCAP 177* 116* 163* 112* 161*   Iron Studies: No results for input(s): IRON, TIBC, TRANSFERRIN, FERRITIN in the last 72 hours. '@lablastinr3'$ @ Studies/Results: No results found. Medications:  . (feeding supplement) PROSource Plus  30 mL Oral TID BM  . amLODipine  10 mg Oral QHS  . Chlorhexidine Gluconate Cloth  6 each Topical BID  . Chlorhexidine Gluconate Cloth  6 each Topical Q0600  . darbepoetin (ARANESP) injection - DIALYSIS  200 mcg Intravenous Q Tue-HD  . famotidine  20 mg Oral QHS  . feeding supplement (NEPRO CARB STEADY)  237 mL Oral BID BM  . heparin injection (subcutaneous)  7,500 Units Subcutaneous Q8H  . insulin aspart  0-9 Units Subcutaneous TID WC  . insulin glargine  11 Units Subcutaneous QHS  . lanthanum  500 mg Oral TID WC  . metoCLOPramide  5 mg Oral TID AC  . multivitamin  1 tablet Oral QHS  . pneumococcal 23 valent vaccine  0.5 mL Intramuscular Tomorrow-1000  . polyethylene glycol  17 g Oral Daily  . senna-docusate  2 tablet Oral BID     Dialysis Orders: NWTTS 4h 400/800 143.5kg 3K/2.5Ca TDCHeparin3000units IV TIW - Mircera159mgIVq2wks - last 12/8 - Venofer '100mg'$ IVqHD x10 - completed 6/10 doses  Assessment/Plan: 1. Sacral decub/coccygealosteo:S/p 6 weeks  ofVanc/Cefepime (finished1/20/22) - ID following. In CIR. 2. 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 ESRD:Back on usual TTS schedule, next  HD 06/22/20. 4. Diarrhea -Recurrent issue, thought to be d/t Renvelawhich has now been stoppedbut still having issues with diarrhea on Fosrenol. Decrease dose to BID. 5. BP/volume:Patient having issues with systolic hypertension/orthostatic hypotension. Best option is to attempt to better control HTN. Still on amlodipine. Increased dose to 10 mg PO q day  but changed schedule to evening after rehab activities which seems to have helped. DC PRN hydralazine as he is not on BB. Volume is stable but  weights are trending down. Not eating well/issues with diarrhea. UF as tolerated. Lower EDW appropriately.  6. COVID PNA- tested positive on 11/30, is off isolation now.  7. Anemiaof CKD: Hgb 9.5,cont Aranesp 276mgqSat.  8. Secondary Hyperparathyroidism:CorrCa10.2,use low Ca bath.Not on VDRA.Stoppedrenvela 2/2diarrhea, tryingFosrenol insteadPO4 at goal but still with diarrhea. Decrease dose to BID with largest meals.PO4 at goal.  9. Nutrition:Alb remains low, continue Nepro. 10. DMT2 - per admit  Dalyah Pla H. Khang Hannum NP-C 06/20/2020, 11:49 AM  [phone]

## 2020-06-21 LAB — GLUCOSE, CAPILLARY
Glucose-Capillary: 136 mg/dL — ABNORMAL HIGH (ref 70–99)
Glucose-Capillary: 211 mg/dL — ABNORMAL HIGH (ref 70–99)
Glucose-Capillary: 161 mg/dL — ABNORMAL HIGH (ref 70–99)
Glucose-Capillary: 226 mg/dL — ABNORMAL HIGH (ref 70–99)

## 2020-06-21 MED ORDER — CHLORHEXIDINE GLUCONATE CLOTH 2 % EX PADS
6.0000 | MEDICATED_PAD | Freq: Every day | CUTANEOUS | Status: DC
  Administered 2020-06-22 – 2020-06-27 (×4): 6 via TOPICAL

## 2020-06-21 MED ORDER — SENNOSIDES-DOCUSATE SODIUM 8.6-50 MG PO TABS: 1.0000 | ORAL_TABLET | Freq: Two times a day (BID) | ORAL | Status: DC

## 2020-06-21 NOTE — Progress Notes (Signed)
Occupational Therapy Session Note  [name] MRN: [id number] Date of Birth: 07-30-1970  Today's Date: 06/21/2020 OT Individual Time: [id number] and [id number] OT Individual Time Calculation (min): 56 min and 58 min   Skilled Therapeutic Interventions/Progress Updates:    Pt greeted in bed with no c/o pain, declining bathing and already dressed. He wanted to start session by brushing his teeth. Supine BP: 135/81. OT first ACE wrapped his LEs and then applied abdominal binder sitting EOB. Setup for oral care and then BP reassessed, reading 102/77. He wanted to work on his standing. Pt completed 5 sit<stands using bari Stedy given Max A (boost directly from the back). Longest standing window without rest 42 sec, standing window decreasing to 12 sec on 5th stand. BP assessed post 3rd stand (112/90) and also post 5th stand (121/91). Pt reporting mild-moderate dizziness post stands, mostly limited by "burning" sensations and fatigue in LEs. He then transitioned to supine where compression garments were doffed due to hypertension at rest. Taught him how to use a strap to work on LE ROM and strength bedlevel. Pt with a good understanding of where to position the strap to prevent pressure on multiple foot wounds. Pt with absent sensation in areas of feet due to neuropathy from DM. Pt remained in bed at close of session, all needs within reach, feeling proud that he broke his standing record today.   2nd Session 1:1 tx (58 min) Pt greeted in the bed after finishing lunch. BP: 145/84. OT first donned B LE ACE wraps and once pt transitioned to EOB, donned abdominal binder with Mod A. For beginning of session, worked on [employer] and core strengthening exercises using weighted ball, focused on pt keeping eyes open and tolerating positional changes with dynamic movement. He did well, reporting mild-moderate dizziness, took several rest breaks. BP after stretching 144/101 so abdominal  binder was removed. Pt reported feeling fine, wanting to continue with exercises. BP 122/94 after completing exercises with the weighted ball 10 reps 2 sets. Sit<stand in [place] completed with Max A and in about 10 seconds into stand he reported being incontinent of bowels. Pt returned to bed where ACE bandages were doffed, pt doffed gripper socks with supervision using reclined figure 4 given Min balance assistance. Mod A to don both socks and then he lowered his pants in prep for hygiene tasks. Pt assisted with frontal hygiene and then rolled towards the Rt with Min A. Pt with loose liquid BM that soiled his sacral wound dressings. Hygiene completed with Total A. Pt left in care of RN for dressing change.    Therapy Documentation Precautions:  Precautions Precautions: Fall Precaution Comments: wounds on bilat feet and L sacrum, monitor orthostatic BPs Required Braces or Orthoses: Other Brace Other Brace: abdominal binder Restrictions Weight Bearing Restrictions: No ADL: ADL Eating: Set up,Supervision/safety Where Assessed-Eating: Bed level Grooming: Supervision/safety,Setup Where Assessed-Grooming: Sitting at sink,Wheelchair Upper Body Bathing: Minimal assistance Where Assessed-Upper Body Bathing: Wheelchair,Sitting at sink Lower Body Bathing: Moderate assistance Where Assessed-Lower Body Bathing: Bed level Upper Body Dressing: Setup,Supervision/safety Where Assessed-Upper Body Dressing: Sitting at sink,Wheelchair Lower Body Dressing: Maximal assistance Where Assessed-Lower Body Dressing: Bed level      Therapy/Group: Individual Therapy  Fred [name] 06/21/2020, 12:26 PM

## 2020-06-21 NOTE — Progress Notes (Signed)
Unable to obtain weight on specialty bed. Scale would not register.

## 2020-06-21 NOTE — Progress Notes (Signed)
Physical Therapy Weekly Progress Note  [name] MRN: [id number] Date of Birth: [birth year]  Beginning of progress report period: June 15, 2019 End of progress report period: June 22, 2019  Today's Date: 06/21/2020 PT Individual Time: 9833-8250 PT Individual Time Calculation (min): 57 min   Patient has met 3 of 5 short term goals.  Pt continues to be limited by orthostatic hypotension however has progressed with this, nausea and vomiting has also limited pt's functional progress. Pt continues to be very motivated and wants to participate in all therapeutic tasks. Pt's overall ability to transfer Sit to stand from EOB with stedy use has greatly improved and is able to stand more reps, increased static standing tolerance, decreased level of assist, pt is also tolerating increased time in upright sitting position.   Patient continues to demonstrate the following deficits muscle weakness, decreased cardiorespiratoy endurance and decreased sitting balance, decreased standing balance, decreased postural control and decreased balance strategies and therefore will continue to benefit from skilled PT intervention to increase functional independence with mobility.  Patient progressing toward long term goals..  Continue plan of care.  PT Short Term Goals Week 1:  PT Short Term Goal 1 (Week 1): pt will perform all bed mobility with CGA PT Short Term Goal 1 - Progress (Week 1): Not met PT Short Term Goal 2 (Week 1): Pt will transfer sit<>stand with LRAD and mod A PT Short Term Goal 2 - Progress (Week 1): Not met PT Short Term Goal 3 (Week 1): Pt will transfer bed<>chair with LRAD and min A PT Short Term Goal 3 - Progress (Week 1): Not met Week 2:  PT Short Term Goal 1 (Week 2): Pt will transfer bed<>chair with LRAD and min A PT Short Term Goal 1 - Progress (Week 2): Progressing toward goal PT Short Term Goal 2 (Week 2): Pt will transfer sit<>stand with LRAD and mod A PT  Short Term Goal 2 - Progress (Week 2): Met PT Short Term Goal 3 (Week 2): pt will perform all bed mobility with CGA PT Short Term Goal 3 - Progress (Week 2): Progressing toward goal PT Short Term Goal 4 (Week 2): pt will recall pressure relief schedule and safe techniques with pressure relief PT Short Term Goal 4 - Progress (Week 2): Met PT Short Term Goal 5 (Week 2): pt will tolerate sitting OOB for 45 mins without adverse effects PT Short Term Goal 5 - Progress (Week 2): Met Week 3:  PT Short Term Goal 1 (Week 3): Pt will transfer bed<>chair with LRAD and min A PT Short Term Goal 2 (Week 3): Pt will transfer sit<>stand with LRAD and mod A from WC PT Short Term Goal 3 (Week 3): pt will perform all bed mobility with CGA  Skilled Therapeutic Interventions/Progress Updates:    pt received in bed and agreeable to therapy. Pt's BP taken in supine to be 133/86, pt directed in supine>sit CGA with extra time to complete, abdominal binder and B ace wraps on BLE donned max A. Pt directed in total x6 reps of Sit to stand from EOB with stedy initially mod A improving to min A on final three reps. Pt able to tolerate standing at least 45s each time and x2 reps of one min each. Pt denied dizziness throughout standing and prolonged sitting EOB. Pt directed in 2x10 marching, LAQ with 5s holds, and x10 lateral leans and anterior leans at EOB. Pt then directed in sit>supine CGA and agreeable to  bed in chair position. Denied being left with abdominal binder and ace wraps in place and requested them off. Pt educated to call nursing and lower bed to flatter position if dizziness or lightheadedness felt, he agreed and nursing made aware and agreeable. Pt left in chair position, All needs in reach and in good condition. Call light in hand.    Therapy Documentation Precautions:  Precautions Precautions: Fall Precaution Comments: wounds on bilat feet and L sacrum, monitor orthostatic BPs Required Braces or Orthoses:  Other Brace Other Brace: abdominal binder Restrictions Weight Bearing Restrictions: No General:   Vital Signs: Therapy Vitals Temp: 97.9 F (36.6 C) Pulse Rate: 99 Resp: 18 BP: (!) 145/84 Patient Position (if appropriate): Lying Oxygen Therapy SpO2: 99 % O2 Device: Room Air Pain:   Vision/Perception     Mobility:   Locomotion :    Trunk/Postural Assessment :    Balance:   Exercises:   Other Treatments:     Therapy/Group: Individual Therapy  [name] 06/21/2020, 3:50 PM

## 2020-06-21 NOTE — Progress Notes (Addendum)
[facility] Progress Note   Subjective:   Seen in room. Reports he gets some dizziness only with prolonged standing now. Denies CP, palpitations, SOB, and nausea. Does report diarrhea today.   Objective Vitals:   06/20/20 0424 06/20/20 1627 06/20/20 1946 06/21/20 0347  BP: 127/89 (!) 144/93 (!) 143/91 117/71  Pulse: 98 98 (!) 102 96  Resp: '18 17 18 18  '$ Temp: 98.7 F (37.1 C) 98.5 F (36.9 C) 99.4 F (37.4 C) 98.4 F (36.9 C)  TempSrc:   Oral   SpO2: 99% 100% 98% 99%  Weight: (!) 153.8 kg     Height:       Physical Exam General: WDWN male, alert and in NAD Heart: RRR, no murmurs, rubs or gallops Lungs: CTA bilaterally without wheezing, rhonchi or rales Abdomen: Soft, non-tender, non-distended, +BS Extremities: No edema b/l lower extremities Dialysis Access:  R IJ [facility]  Additional Objective Labs: Basic Metabolic Panel: Recent Labs  Lab 06/15/20 1512 06/18/20 0020 06/19/20 1541  NA 135 135 132*  K 4.7 4.5 4.6  CL 97* 95* 96*  CO2 '25 25 25  '$ GLUCOSE 137* 124* 194*  BUN 60* 54* 50*  CREATININE 7.38* 6.89* 6.69*  CALCIUM 8.9 8.8* 8.9  PHOS 5.6* 3.9 4.4   Liver Function Tests: Recent Labs  Lab 06/15/20 1512 06/18/20 0020 06/19/20 1541  ALBUMIN 2.1* 2.1* 2.2*   CBC: Recent Labs  Lab 06/15/20 1511 06/18/20 0020 06/19/20 1541  WBC 11.9* 9.4 10.8*  NEUTROABS  --  6.5  --   HGB 9.8* 9.5* 9.9*  HCT 32.3* 32.2* 33.5*  MCV 86.4 88.7 87.5  PLT 278 252 269   Blood Culture    Component Value Date/Time   SDES BLOOD SITE NOT SPECIFIED 04/29/2020 1210   SPECREQUEST  04/29/2020 1210    BOTTLES DRAWN AEROBIC AND ANAEROBIC Blood Culture adequate volume   CULT  04/29/2020 1210    NO GROWTH 5 DAYS Performed at [facility], [place] [address], [place], [place] [address]    REPTSTATUS 05/04/2020 FINAL 04/29/2020 1210   CBG: Recent Labs  Lab 06/20/20 0624 06/20/20 1130 06/20/20 1621 06/20/20 2106 06/21/20 0559  GLUCAP 112* 161* 150* 214*  136*   Medications:  . (feeding supplement) PROSource Plus  30 mL Oral TID BM  . amLODipine  10 mg Oral QHS  . Chlorhexidine Gluconate Cloth  6 each Topical BID  . Chlorhexidine Gluconate Cloth  6 each Topical Q0600  . darbepoetin (ARANESP) injection - DIALYSIS  200 mcg Intravenous Q Tue-HD  . famotidine  20 mg Oral QHS  . feeding supplement (NEPRO CARB STEADY)  237 mL Oral BID BM  . heparin injection (subcutaneous)  7,500 Units Subcutaneous Q8H  . insulin aspart  0-9 Units Subcutaneous TID WC  . insulin glargine  11 Units Subcutaneous QHS  . lanthanum  500 mg Oral TID WC  . metoCLOPramide  5 mg Oral TID AC  . multivitamin  1 tablet Oral QHS  . pneumococcal 23 valent vaccine  0.5 mL Intramuscular Tomorrow-1000  . polyethylene glycol  17 g Oral Daily  . senna-docusate  1 tablet Oral BID    Dialysis Orders: NWTTS 4h 400/800 143.5kg 3K/2.5Ca TDCHeparin3000units IV TIW - Mircera123mgIVq2wks - last 12/8 - Venofer '100mg'$ IVqHD x10 - completed 6/10 doses  Assessment/Plan: 1. Sacral decub/coccygealosteo:S/p 6 weeks ofVanc/Cefepime (finished1/20/22) - ID following. In CIR. 2. 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 ESRD:Back on usual TTS schedule, next  HD02/01/22. 4. Diarrhea -Recurrent issue, thought to be d/t Renvelawhich has now been stoppedbut still having issues with diarrhea on Fosrenol. Decrease dose to BID.Also d/c senna.  5. BP/volume:Patient having issues with systolic hypertension/orthostatic hypotension. Best option is to attempt to better control HTN. Still on amlodipine. Increaseddose to 10 mg PO q day  but changedschedule to evening after rehab activities which seems to have helped.Volume is stable but weights are trending down. Not eating well/issues with diarrhea. UF as tolerated. 6. COVID PNA- tested positive on 11/30, is off isolation now.  7. Anemiaof CKD: Hgb 9.5,cont Aranesp 238mgqSat.   8. Secondary Hyperparathyroidism:CorrCa10.2,use low Ca bath.Not on VDRA.Stoppedrenvela 2/2diarrhea, tryingFosrenol insteadPO4 at goal but still with diarrhea. Decrease dose to BID with largest meals.PO4 at goal. 9. Nutrition:Alb remains low, continue Nepro. 10. DMT2 - per admit   SAnice Paganini PA-C 06/21/2020, 11:37 AM  [facility] Pager: [phone] I have seen and examined this patient and agree with plan and assessment in the above note with renal recommendations/intervention highlighted.  Back on his outpatient schedule tomorrow.  JGovernor RooksColadonato,MD 06/21/2020 12:54 PM

## 2020-06-21 NOTE — Progress Notes (Signed)
[facility] PHYSICAL MEDICINE & REHABILITATION PROGRESS NOTE   Subjective/Complaints:  Pt reports had bowel accidents x2 overnight- says doesn't always have control of bowels since hospitalization.   Passes gas, and finds out is stool. Slept poorly as a result.    ROS:  Pt denies SOB, abd pain, CP, N/V/C/D, and vision changes  Objective:   No results found. Recent Labs    06/19/20 1541  WBC 10.8*  HGB 9.9*  HCT 33.5*  PLT 269   Recent Labs    06/19/20 1541  NA 132*  K 4.6  CL 96*  CO2 25  GLUCOSE 194*  BUN 50*  CREATININE 6.69*  CALCIUM 8.9    Intake/Output Summary (Last 24 hours) at 06/21/2020 9798 Last data filed at 06/20/2020 2359 Gross per 24 hour  Intake 780 ml  Output 200 ml  Net 580 ml     Pressure Injury 05/01/20 Buttocks Left Stage 4 - Full thickness tissue loss with exposed bone, tendon or muscle. packed with santyl gauze, dressed by WOCN, (Active)  05/01/20 0230  Location: Buttocks  Location Orientation: Left  Staging: Stage 4 - Full thickness tissue loss with exposed bone, tendon or muscle.  Wound Description (Comments): packed with santyl gauze, dressed by WOCN,  Present on Admission: Yes     Pressure Injury 05/01/20 Ankle Left;Lateral Unstageable - Full thickness tissue loss in which the base of the injury is covered by slough (yellow, tan, gray, green or brown) and/or eschar (tan, brown or black) in the wound bed. smal circular wound on the (Active)  05/01/20 0230  Location: Ankle  Location Orientation: Left;Lateral  Staging: Unstageable - Full thickness tissue loss in which the base of the injury is covered by slough (yellow, tan, gray, green or brown) and/or eschar (tan, brown or black) in the wound bed.  Wound Description (Comments): smal circular wound on the bone  Present on Admission: Yes     Pressure Injury 05/01/20 Foot Left;Posterior Unstageable - Full thickness tissue loss in which the base of the injury is covered by slough  (yellow, tan, gray, green or brown) and/or eschar (tan, brown or black) in the wound bed. 4th and 5th toes appear n (Active)  05/01/20 0230  Location: Foot (4th and 5th toes and lateral/anterior side of the sole)  Location Orientation: Left;Posterior  Staging: Unstageable - Full thickness tissue loss in which the base of the injury is covered by slough (yellow, tan, gray, green or brown) and/or eschar (tan, brown or black) in the wound bed.  Wound Description (Comments): 4th and 5th toes appear necrotic circular wound on sole  Present on Admission: Yes     Pressure Injury 05/01/20 Foot Right;Medial;Posterior Unstageable - Full thickness tissue loss in which the base of the injury is covered by slough (yellow, tan, gray, green or brown) and/or eschar (tan, brown or black) in the wound bed. large circular wo (Active)  05/01/20 0230  Location: Foot  Location Orientation: Right;Medial;Posterior  Staging: Unstageable - Full thickness tissue loss in which the base of the injury is covered by slough (yellow, tan, gray, green or brown) and/or eschar (tan, brown or black) in the wound bed.  Wound Description (Comments): large circular wound on sole  and heel  Present on Admission: Yes     Pressure Injury 05/08/20 Heel Right Unstageable - Full thickness tissue loss in which the base of the injury is covered by slough (yellow, tan, gray, green or brown) and/or eschar (tan, brown or black) in the  wound bed. (Active)  05/08/20 1718  Location: Heel  Location Orientation: Right  Staging: Unstageable - Full thickness tissue loss in which the base of the injury is covered by slough (yellow, tan, gray, green or brown) and/or eschar (tan, brown or black) in the wound bed.  Wound Description (Comments):   Present on Admission: Yes    Physical Exam: Vital Signs Blood pressure 117/71, pulse 96, temperature 98.4 F (36.9 C), resp. rate 18, height 6\' 6"  (1.981 m), weight (!) 153.8 kg, SpO2 99 %.   Physical  Exam   General: asleep woke to stimuli, NAD Mood and affect are flat Heart: RRR- no JVD Lungs: CTA B/L- no W/R/R- good air movement Abdomen: Soft, NT, ND, (+)BS -flatter than last week Extremities: No clubbing, cyanosis, or edema Skin: has stable pressure ulcers on feet and sacrum Neurological: Ox3    Comments: Patient is alert in no acute distress. 5/5 strength throughout.     Assessment/Plan: 1. Functional deficits which require 3+ hours per day of interdisciplinary therapy in a comprehensive inpatient rehab setting.  Physiatrist is providing close team supervision and 24 hour management of active medical problems listed below.  Physiatrist and rehab team continue to assess barriers to discharge/monitor patient progress toward functional and medical goals  Care Tool:  Bathing    Body parts bathed by patient: Right arm,Left arm,Chest,Abdomen,Right upper leg,Left upper leg,Face   Body parts bathed by helper: Right lower leg,Left lower leg,Buttocks     Bathing assist Assist Level: Minimal Assistance - Patient > 75% (declined LB today, EOB level)     Upper Body Dressing/Undressing Upper body dressing   What is the patient wearing?: Pull over shirt    Upper body assist Assist Level: Contact Guard/Touching assist    Lower Body Dressing/Undressing Lower body dressing      What is the patient wearing?: Pants     Lower body assist Assist for lower body dressing: Minimal Assistance - Patient > 75% (bed level)     Toileting Toileting    Toileting assist Assist for toileting: Maximal Assistance - Patient 25 - 49% Assistive Device Comment:  (urinal)   Transfers Chair/bed transfer  Transfers assist  Chair/bed transfer activity did not occur: Safety/medical concerns (Per nursing report)  Chair/bed transfer assist level: Moderate Assistance - Patient 50 - 74% Chair/bed transfer assistive device: Sliding board   Locomotion Ambulation   Ambulation assist    Ambulation activity did not occur: Safety/medical concerns (fatigue, lightheadedness, generalized weakness)          Walk 10 feet activity   Assist  Walk 10 feet activity did not occur: Safety/medical concerns (fatigue, lightheadedness, generalized weakness)        Walk 50 feet activity   Assist Walk 50 feet with 2 turns activity did not occur: Safety/medical concerns (fatigue, lightheadedness, generalized weakness)         Walk 150 feet activity   Assist Walk 150 feet activity did not occur: Safety/medical concerns (fatigue, lightheadedness, generalized weakness)         Walk 10 feet on uneven surface  activity   Assist Walk 10 feet on uneven surfaces activity did not occur: Safety/medical concerns (fatigue, lightheadedness, generalized weakness)         Wheelchair     Assist Will patient use wheelchair at discharge?: Yes Type of Wheelchair: Manual    Wheelchair assist level: Supervision/Verbal cueing Max wheelchair distance: 100    Wheelchair 50 feet with 2 turns activity    Assist  Assist Level: Supervision/Verbal cueing   Wheelchair 150 feet activity     Assist      Assist Level: Minimal Assistance - Patient > 75%   Blood pressure 117/71, pulse 96, temperature 98.4 F (36.9 C), resp. rate 18, height _0  (1.981 m), weight (!) 153.8 kg, SpO2 99 %.  Medical Problem List and Plan: 1.  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 2.  Antithrombotics: -DVT/anticoagulation: Subcutaneous heparin.  Check vascular study             -antiplatelet therapy: N/A 3. Pain Management: Tylenol as needed  1/31- pain controlled- con't regimen 4. Mood: Provide emotional support             -antipsychotic agents: N/A 5. Neuropsych: This patient is? capable of making decisions on his own behalf.  1/17- pt appeared slightly confused but amiable  this AM- will monitor  1/20- more with it this AM  1/27- appears Ox3 this AM 6. Skin/Wound Care: Routine skin checks 7. Fluids/Electrolytes/Nutrition: Routine in and outs with follow-up chemistries 8.  End-stage renal disease.  Hemodialysis per renal services 9.  Sacral decubitus ulcer as well as ischemic ulcers on the left foot fourth toe and fifth metatarsal head and right heel and fifth metatarsal head.  Wound care nurse follow-up.  Patient to see Dr. Sharol Given outpatient.  Continue bilateral PRAFO's. 10.  ID/ coccygeal osteomyelitis.  Continue vancomycin and cefepime through 06/10/2020  1/17- WBC 8.1  1/18- will schedule turning q2 hours to get off wounds- also will make sure on specialty bed  1/20- is on specialty bed  1/27- off IV ABX for osteomyletis- WBC slightly elevated at 11.9- will check CRP and ESR for baseline?  1/28- WBC down to 9.5- CRP pending, but ESR is 30- which is great considering wounds 10.  Anemia of chronic disease.  Continue Aranesp. Hgb 8.8 on 1/16, repeat with HD  1/17- no CBC today, only BMP- will try and check with HD on Tuesday.   1/19- doing better based on labs yesterday  1/26- Hb 9.8- con't regimen 11.  Diabetes mellitus with peripheral neuropathy .  Hemoglobin A1c 5.6.  Lantus insulin 14 units nightly  1/22- seen by Diabetes coordinator they want me to decrease Lantus to 8 units, however Bgs running 86-120- which is ideal- will wait to decrease esp with all his wounds-  CBG (last 3)  Recent Labs    06/20/20 1621 06/20/20 2106 06/21/20 0559  GLUCAP 150* 214* 136*  1/31- BGs controlled- con't regimen 12.  Morbid obesity.  BMI 38.37.  Dietary follow-up  13. Dizziness/nausea with HTN  1/17- Sx's so significant, vomited this AM when got up- will try abd binder and ACE wraps for LEs- since due to osteomyelitis, don't think can use TEDs- if need be, can add Midodrine, but due to ESRD, cannot start Florinef.  1/18- BP laying is 164/101 this AM- might not do well  with midodrine?  1/19- since not clear if actually orthostatic, per renal, will order daily orthostatics x7 days.   1/20- BP 177/108 this AM- used to be on Metoprolol and higher dose of  Norvasc- will give 1x time Metoprolol 12.5 mg since BP so high AND pulse is in 80s  1/23- stating to think he's so used to really high BP, so when comes down to more normal level, he's hypotensive.  1/25- BP slightly elevated- when c/o dizziness, BP not lower- but Sx's are improving- con't to monitor  1/27- will order hydralazine 25 mg PO q6 hours prn for SBP >180 or DBP >110- really think dizziness is "functional orthostasis" since his body is used to such high BPs    Vitals:   06/20/20 1946 06/21/20 0347  BP: (!) 143/91 117/71  Pulse: (!) 102 96  Resp: 18 18  Temp: 99.4 F (37.4 C) 98.4 F (36.9 C)  SpO2: 98% 99%  some lability although no need for prn hydralazine over the last 24h 14. Diarrhea with new constipation  1/18- LBM 3 days ago- on imodium 2 mg q4 hours around the clock- will change to q8 hours to see if can get him to have BM.  1/19- wil give Sorbitol 60 ml today since no BM- maintain lower dose of imodium per pt request.  1/20- thinks sorbitol made him sick- it's actually the ileus that made him sick- refusing NG tube- will give Suppository per pt request (KUB shows likely ileus) and changed diet to clear liquid diet for now. .   Pt states he was on 3 stool softeners on acute care which worked for him  Will start senna S 2 po BID   1/25- restarted miralax daily- pt scared of loose stools, but said will "try it"".  1/26- had 2 large Bms and 1 tiny BM- finally working- con't regimen-don't stop miralax/sorbitol right now.  1.27- 2-3 more BMs yesterday- con't regimen- looking better   1/31- had 2 BMs that were accidents- will back off Senokot to 1 tab BID and con't Miralax 15. Leukocytosis  1/26- will check CXR since WBC up to 11.9- Tm 99.1- not elevated.   1/27- CXR showed minimal  atalectasis- will monitor- will also recheck labs in AM with ESR/CRP due to wounds.   sedrate mild elevation but unchanged vs 1 mo ago     LOS: 15 days A FACE TO FACE EVALUATION WAS PERFORMED  Fred [name] 06/21/2020, 8:22 AM

## 2020-06-22 LAB — GLUCOSE, CAPILLARY
Glucose-Capillary: 109 mg/dL — ABNORMAL HIGH (ref 70–99)
Glucose-Capillary: 156 mg/dL — ABNORMAL HIGH (ref 70–99)
Glucose-Capillary: 101 mg/dL — ABNORMAL HIGH (ref 70–99)
Glucose-Capillary: 127 mg/dL — ABNORMAL HIGH (ref 70–99)

## 2020-06-22 MED ORDER — DARBEPOETIN ALFA 200 MCG/0.4ML IJ SOSY
PREFILLED_SYRINGE | INTRAMUSCULAR | Status: AC
  Filled 2020-06-22: qty 0.4

## 2020-06-22 MED ORDER — HEPARIN SODIUM (PORCINE) 1000 UNIT/ML IJ SOLN
INTRAMUSCULAR | Status: AC
  Administered 2020-06-22: 3800 [IU]
  Filled 2020-06-22: qty 4

## 2020-06-22 MED ORDER — HYDRALAZINE HCL 25 MG PO TABS
25.0000 mg | ORAL_TABLET | Freq: Three times a day (TID) | ORAL | Status: DC | PRN
  Filled 2020-06-22: qty 1

## 2020-06-22 NOTE — Progress Notes (Signed)
Physical Therapy Session Note  [name] MRN: [id number] Date of Birth: Nov 03, 1970  Today's Date: 06/22/2020 PT Individual Time: 0950-1030 and 1100-1109 PT Individual Time Calculation (min): 40 mins and 9 min   Short Term Goals: Week 1:  PT Short Term Goal 1 (Week 1): pt will perform all bed mobility with CGA PT Short Term Goal 1 - Progress (Week 1): Not met PT Short Term Goal 2 (Week 1): Pt will transfer sit<>stand with LRAD and mod A PT Short Term Goal 2 - Progress (Week 1): Not met PT Short Term Goal 3 (Week 1): Pt will transfer bed<>chair with LRAD and min A PT Short Term Goal 3 - Progress (Week 1): Not met Week 2:  PT Short Term Goal 1 (Week 2): Pt will transfer bed<>chair with LRAD and min A PT Short Term Goal 1 - Progress (Week 2): Progressing toward goal PT Short Term Goal 2 (Week 2): Pt will transfer sit<>stand with LRAD and mod A PT Short Term Goal 2 - Progress (Week 2): Met PT Short Term Goal 3 (Week 2): pt will perform all bed mobility with CGA PT Short Term Goal 3 - Progress (Week 2): Progressing toward goal PT Short Term Goal 4 (Week 2): pt will recall pressure relief schedule and safe techniques with pressure relief PT Short Term Goal 4 - Progress (Week 2): Met PT Short Term Goal 5 (Week 2): pt will tolerate sitting OOB for 45 mins without adverse effects PT Short Term Goal 5 - Progress (Week 2): Met Week 3:  PT Short Term Goal 1 (Week 3): Pt will transfer bed<>chair with LRAD and min A PT Short Term Goal 2 (Week 3): Pt will transfer sit<>stand with LRAD and mod A from WC PT Short Term Goal 3 (Week 3): pt will perform all bed mobility with CGA  Skilled Therapeutic Interventions/Progress Updates:    session 1: pt received in bed and agreeable to therapy, nursing leaving room completing wound care. Pt denied pain and reported no dizziness at this time but did feel more tired this date and reporting being tired post OT session. Pt directed in  supine>sit CGA with extra time to complete, sat EOB  BP taken to be 150/101. PT donned abdominal binder and BLE ace wraps. BP taken to be 138/84 in sitting. Pt directed in Sit to stand at stedy mod A with VC for trunk extension, setup, and glute activation to improve full standing level of I. Pt able to stand one minute and required x2 attempts to achieve standing for slightly elevated bed. Pt reported some dizziness once returning to sitting EOB and BP taken to be 165/121 and pt reported some nausea and his face feeling hot, 2 mins rest break and abdominal binder loosened, BP taken again in sitting to be 152/98 and pt reported he continued to have same symptoms, did not worsen but no improvement. PT removed abdominal binder and BLE ace wraps, pt unable to attempt any further transfer training limited 2/2 BP and symptoms. Pt then directed in sit>supine with CGA, pt reported he thought he had had a BM. Pt did have incontinent BM, directed in  Rolling x3 to each R and L, CGA with bedrails, max A to don/doff brief and total A for pericare, mod A for donning pants. Pt required extra time for all mobility during session and BP monitoring. Pt left in bed, All needs in reach and in good condition. Call light in hand.  And nursing  made aware of pt's BP readings.   Session 2: pt received in bed and requesting to have BP monitored prior to attempting to mobilize. PT retrieved monitor,  Assessed BP to be 135/81, pt noticeably diaphoretic in bed, he reported feeling "clamy", pt denied to participate with PT further, pt asked pt further questions about symptoms, pt reporting greatly increased nausea, dizziness, and a current headache, did not rank. PT left pt as found, side lying in bed, All needs in reach and in good condition. Call light in hand.  And nursing made aware immediately, nursing reporting to assess pt at this time.    Therapy Documentation Precautions:  Precautions Precautions: Fall Precaution Comments:  wounds on bilat feet and L sacrum, monitor orthostatic BPs Required Braces or Orthoses: Other Brace Other Brace: abdominal binder Restrictions Weight Bearing Restrictions: No General: PT Amount of Missed Time (min): 21 Minutes PT Missed Treatment Reason: Patient ill (Comment);Patient fatigue;Pain Vital Signs:  Pain: Pain Assessment Pain Scale: 0-10 Pain Score: 0-No pain Mobility:   Locomotion :    Trunk/Postural Assessment :    Balance:   Exercises:   Other Treatments:      Therapy/Group: Individual Therapy  [name] 06/22/2020, 11:22 AM

## 2020-06-22 NOTE — Progress Notes (Signed)
Patient ID: Fred [name], male   DOB: [birth year], 50 y.o.   MRN: [id number]  This SW covering for primary SW, Fries.  SW met with pt in room and sister Threasa Beards on conference call to provide updates from team conference on gains made, however, informed pt will need a hospital bed, and will d/c at w/c level. Pt reported that the home was not large enough for a w/c. His sister states that he home was built in the 9s and the doorways are too small. We discussed alternate options such as a SNF for short term rehab, and family coming in for family edu to decide if they are able to provide care to him at home. Family amenable to exploring both options. Pt sister to follow-up about family edu next week once she speaks with their dad. Family would like pt to be closer towards [facility]. Reports pt was at [facility] in the past and this was a good experience. SW to provide pt with SNF locations and e-mail locations to pt sister: [email or web address].  SW emailed pt sister SNF lists, and provided pt with SNF list as well.   Loralee Pacas, MSW, [place] Office: [phone] Cell: [phone] Fax: [phone]

## 2020-06-22 NOTE — Progress Notes (Signed)
[place] PHYSICAL MEDICINE & REHABILITATION PROGRESS NOTE   Subjective/Complaints:  Pt reports was up on and off all night due to voiding and having BMs- doesn't want any meds for sleep- makes him "wonky".   Yesterday with therapy went well- no issues with that.   ROS:  Pt denies SOB, abd pain, CP, N/V/C/D, and vision changes   Objective:   No results found. Recent Labs    06/19/20 1541  WBC 10.8*  HGB 9.9*  HCT 33.5*  PLT 269   Recent Labs    06/19/20 1541  NA 132*  K 4.6  CL 96*  CO2 25  GLUCOSE 194*  BUN 50*  CREATININE 6.69*  CALCIUM 8.9    Intake/Output Summary (Last 24 hours) at 06/22/2020 0844 Last data filed at 06/22/2020 0456 Gross per 24 hour  Intake 600 ml  Output 100 ml  Net 500 ml     Pressure Injury 05/01/20 Buttocks Left Stage 4 - Full thickness tissue loss with exposed bone, tendon or muscle. packed with santyl gauze, dressed by WOCN, (Active)  05/01/20 0230  Location: Buttocks  Location Orientation: Left  Staging: Stage 4 - Full thickness tissue loss with exposed bone, tendon or muscle.  Wound Description (Comments): packed with santyl gauze, dressed by WOCN,  Present on Admission: Yes     Pressure Injury 05/01/20 Ankle Left;Lateral Unstageable - Full thickness tissue loss in which the base of the injury is covered by slough (yellow, tan, gray, green or brown) and/or eschar (tan, brown or black) in the wound bed. smal circular wound on the (Active)  05/01/20 0230  Location: Ankle  Location Orientation: Left;Lateral  Staging: Unstageable - Full thickness tissue loss in which the base of the injury is covered by slough (yellow, tan, gray, green or brown) and/or eschar (tan, brown or black) in the wound bed.  Wound Description (Comments): smal circular wound on the bone  Present on Admission: Yes     Pressure Injury 05/01/20 Foot Left;Posterior Unstageable - Full thickness tissue loss in which the base of the injury is covered by slough  (yellow, tan, gray, green or brown) and/or eschar (tan, brown or black) in the wound bed. 4th and 5th toes appear n (Active)  05/01/20 0230  Location: Foot (4th and 5th toes and lateral/anterior side of the sole)  Location Orientation: Left;Posterior  Staging: Unstageable - Full thickness tissue loss in which the base of the injury is covered by slough (yellow, tan, gray, green or brown) and/or eschar (tan, brown or black) in the wound bed.  Wound Description (Comments): 4th and 5th toes appear necrotic circular wound on sole  Present on Admission: Yes     Pressure Injury 05/01/20 Foot Right;Medial;Posterior Unstageable - Full thickness tissue loss in which the base of the injury is covered by slough (yellow, tan, gray, green or brown) and/or eschar (tan, brown or black) in the wound bed. large circular wo (Active)  05/01/20 0230  Location: Foot  Location Orientation: Right;Medial;Posterior  Staging: Unstageable - Full thickness tissue loss in which the base of the injury is covered by slough (yellow, tan, gray, green or brown) and/or eschar (tan, brown or black) in the wound bed.  Wound Description (Comments): large circular wound on sole  and heel  Present on Admission: Yes     Pressure Injury 05/08/20 Heel Right Unstageable - Full thickness tissue loss in which the base of the injury is covered by slough (yellow, tan, gray, green or brown) and/or eschar (tan,  brown or black) in the wound bed. (Active)  05/08/20 1718  Location: Heel  Location Orientation: Right  Staging: Unstageable - Full thickness tissue loss in which the base of the injury is covered by slough (yellow, tan, gray, green or brown) and/or eschar (tan, brown or black) in the wound bed.  Wound Description (Comments):   Present on Admission: Yes    Physical Exam: Vital Signs Blood pressure (!) 153/92, pulse 90, temperature 98.2 F (36.8 C), resp. rate 18, height 6' 6"  (1.981 m), weight (!) 153.8 kg, SpO2 98  %.   Physical Exam   General: asleep- woke to stimuli again this AM; disinclined to talk, NAD Mood and affect are still flat Heart: RRR Lungs: CTA B/L- no W/R/R- good air movement Abdomen: Soft, NT, ND, (+)BS -hypoactive Extremities: No clubbing, cyanosis, or edema Skin: has stable pressure ulcers on feet and sacrum Neurological: Ox3    Comments: Patient is alert in no acute distress. 5/5 strength throughout.     Assessment/Plan: 1. Functional deficits which require 3+ hours per day of interdisciplinary therapy in a comprehensive inpatient rehab setting.  Physiatrist is providing close team supervision and 24 hour management of active medical problems listed below.  Physiatrist and rehab team continue to assess barriers to discharge/monitor patient progress toward functional and medical goals  Care Tool:  Bathing    Body parts bathed by patient: Right arm,Left arm,Chest,Abdomen,Right upper leg,Left upper leg,Face   Body parts bathed by helper: Right lower leg,Left lower leg,Buttocks     Bathing assist Assist Level: Minimal Assistance - Patient > 75% (declined LB today, EOB level)     Upper Body Dressing/Undressing Upper body dressing   What is the patient wearing?: Pull over shirt    Upper body assist Assist Level: Contact Guard/Touching assist    Lower Body Dressing/Undressing Lower body dressing      What is the patient wearing?: Pants     Lower body assist Assist for lower body dressing: Minimal Assistance - Patient > 75% (bed level)     Toileting Toileting    Toileting assist Assist for toileting: Maximal Assistance - Patient 25 - 49% Assistive Device Comment:  (urinal)   Transfers Chair/bed transfer  Transfers assist  Chair/bed transfer activity did not occur: Safety/medical concerns (Per nursing report)  Chair/bed transfer assist level: Moderate Assistance - Patient 50 - 74% Chair/bed transfer assistive device: Sliding board    Locomotion Ambulation   Ambulation assist   Ambulation activity did not occur: Safety/medical concerns (fatigue, lightheadedness, generalized weakness)          Walk 10 feet activity   Assist  Walk 10 feet activity did not occur: Safety/medical concerns (fatigue, lightheadedness, generalized weakness)        Walk 50 feet activity   Assist Walk 50 feet with 2 turns activity did not occur: Safety/medical concerns (fatigue, lightheadedness, generalized weakness)         Walk 150 feet activity   Assist Walk 150 feet activity did not occur: Safety/medical concerns (fatigue, lightheadedness, generalized weakness)         Walk 10 feet on uneven surface  activity   Assist Walk 10 feet on uneven surfaces activity did not occur: Safety/medical concerns (fatigue, lightheadedness, generalized weakness)         Wheelchair     Assist Will patient use wheelchair at discharge?: Yes Type of Wheelchair: Manual    Wheelchair assist level: Supervision/Verbal cueing Max wheelchair distance: 100    Wheelchair 50 feet  with 2 turns activity    Assist        Assist Level: Supervision/Verbal cueing   Wheelchair 150 feet activity     Assist      Assist Level: Minimal Assistance - Patient > 75%   Blood pressure (!) 153/92, pulse 90, temperature 98.2 F (36.8 C), resp. rate 18, height 6' 6"  (1.981 m), weight (!) 153.8 kg, SpO2 98 %.  Medical Problem List and Plan: 1.  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 2.  Antithrombotics: -DVT/anticoagulation: Subcutaneous heparin.  Check vascular study             -antiplatelet therapy: N/A 3. Pain Management: Tylenol as needed  1/31- pain controlled- con't regimen 4. Mood: Provide emotional support             -antipsychotic agents: N/A 5. Neuropsych: This patient is? capable of making decisions on his own  behalf.  2/1- disinclined to talk with examiner- but appears more Ox3 6. Skin/Wound Care: Routine skin checks 7. Fluids/Electrolytes/Nutrition: Routine in and outs with follow-up chemistries 8.  End-stage renal disease.  Hemodialysis per renal services 9.  Sacral decubitus ulcer as well as ischemic ulcers on the left foot fourth toe and fifth metatarsal head and right heel and fifth metatarsal head.  Wound care nurse follow-up.  Patient to see Dr. Sharol Given outpatient.  Continue bilateral PRAFO's. 10.  ID/ coccygeal osteomyelitis.  Continue vancomycin and cefepime through 06/10/2020  1/17- WBC 8.1  1/18- will schedule turning q2 hours to get off wounds- also will make sure on specialty bed  1/20- is on specialty bed  1/27- off IV ABX for osteomyletis- WBC slightly elevated at 11.9- will check CRP and ESR for baseline?  1/28- WBC down to 9.5- CRP pending, but ESR is 30- which is great considering wounds  2/1- WBC up to 10.8- will recheck CRP/ESR and go from there.  10.  Anemia of chronic disease.  Continue Aranesp. Hgb 8.8 on 1/16, repeat with HD  1/17- no CBC today, only BMP- will try and check with HD on Tuesday.   1/19- doing better based on labs yesterday  1/26- Hb 9.8- con't regimen 11.  Diabetes mellitus with peripheral neuropathy .  Hemoglobin A1c 5.6.  Lantus insulin 14 units nightly  1/22- seen by Diabetes coordinator they want me to decrease Lantus to 8 units, however Bgs running 86-120- which is ideal- will wait to decrease esp with all his wounds-  CBG (last 3)  Recent Labs    06/21/20 1641 06/21/20 2050 06/22/20 0538  GLUCAP 161* 226* 109*  2/1- BGs 876-811- labile- more elevated than had been- will give 1 more day, and if still up, will titrate up on Lantus 12.  Morbid obesity.  BMI 38.37.  Dietary follow-up  13. Dizziness/nausea with HTN  1/17- Sx's so significant, vomited this AM when got up- will try abd binder and ACE wraps for LEs- since due to osteomyelitis, don't think can  use TEDs- if need be, can add Midodrine, but due to ESRD, cannot start Florinef.  1/18- BP laying is 164/101 this AM- might not do well with midodrine?  1/19- since not clear if actually orthostatic, per renal, will order daily orthostatics x7 days.   1/20- BP 177/108 this AM- used to be on Metoprolol and higher dose of  Norvasc- will give 1x time Metoprolol 12.5  mg since BP so high AND pulse is in 80s  1/23- stating to think he's so used to really high BP, so when comes down to more normal level, he's hypotensive.   1/25- BP slightly elevated- when c/o dizziness, BP not lower- but Sx's are improving- con't to monitor  1/27- will order hydralazine 25 mg PO q6 hours prn for SBP >180 or DBP >110- really think dizziness is "functional orthostasis" since his body is used to such high BPs    Vitals:   06/21/20 2002 06/22/20 0412  BP: (!) 147/79 (!) 153/92  Pulse: 98 90  Resp: 18 18  Temp: 98.9 F (37.2 C) 98.2 F (36.8 C)  SpO2: 100% 98%   2/1- BP 153./92 this AM- and 140s/80 yesterday evening- doing better- will con't regimen for now.  14. Diarrhea with new constipation  1/18- LBM 3 days ago- on imodium 2 mg q4 hours around the clock- will change to q8 hours to see if can get him to have BM.  1/19- wil give Sorbitol 60 ml today since no BM- maintain lower dose of imodium per pt request.  1/20- thinks sorbitol made him sick- it's actually the ileus that made him sick- refusing NG tube- will give Suppository per pt request (KUB shows likely ileus) and changed diet to clear liquid diet for now. .   Pt states he was on 3 stool softeners on acute care which worked for him  Will start senna S 2 po BID   1/25- restarted miralax daily- pt scared of loose stools, but said will "try it"".  1/26- had 2 large Bms and 1 tiny BM- finally working- con't regimen-don't stop miralax/sorbitol right now.  1.27- 2-3 more BMs yesterday- con't regimen- looking better   1/31- had 2 BMs that were accidents- will back  off Senokot to 1 tab BID and con't Miralax  2/1- still having BMs- frequent, but pt willing to wait a little to see if titration yesterday fixed things.  15. Leukocytosis  1/26- will check CXR since WBC up to 11.9- Tm 99.1- not elevated.   1/27- CXR showed minimal atalectasis- will monitor- will also recheck labs in AM with ESR/CRP due to wounds.   sedrate mild elevation but unchanged vs 1 mo ago  2/1- WBC 10.8- will recheck labs in AM with CRP and ESR     LOS: 16 days A FACE TO FACE EVALUATION WAS PERFORMED  Aadam Zhen 06/22/2020, 8:44 AM

## 2020-06-22 NOTE — Progress Notes (Signed)
Show:Clear all '[x]'$ Manual'[]'$ Template'[]'$ Copied  Added by: '[x]'$ Jack Quarto, RN   '[]'$ Hover for details  Pt arrived back to unit from hemodialysis, alert and oriented x4, dressing to HD cath c/d/i. No issues noted at this time. Pt denies any concerns.

## 2020-06-22 NOTE — Progress Notes (Addendum)
[facility] Progress Note   Subjective:   Seen at start of HD. Reports mild headache. Still having diarrhea. Denies SOB, CP, palpitations and dizziness.   Objective Vitals:   06/21/20 1644 06/21/20 2002 06/22/20 0412 06/22/20 1247  BP: 126/87 (!) 147/79 (!) 153/92 (!) 160/88  Pulse:  98 90 95  Resp:  '18 18 16  '$ Temp:  98.9 F (37.2 C) 98.2 F (36.8 C) 97.8 F (36.6 C)  TempSrc:      SpO2:  100% 98% 99%  Weight:      Height:       Physical Exam  General: WDWN male, alert and in NAD Heart: RRR, no murmurs, rubs or gallops Lungs: CTA bilaterally without wheezing, rhonchi or rales Abdomen: Soft, non-tender, non-distended, +BS Extremities: No edema b/l lower extremities Dialysis Access:  R [facility]  Additional Objective Labs: Basic Metabolic Panel: Recent Labs  Lab 06/15/20 1512 06/18/20 0020 06/19/20 1541  NA 135 135 132*  K 4.7 4.5 4.6  CL 97* 95* 96*  CO2 '25 25 25  '$ GLUCOSE 137* 124* 194*  BUN 60* 54* 50*  CREATININE 7.38* 6.89* 6.69*  CALCIUM 8.9 8.8* 8.9  PHOS 5.6* 3.9 4.4   Liver Function Tests: Recent Labs  Lab 06/15/20 1512 06/18/20 0020 06/19/20 1541  ALBUMIN 2.1* 2.1* 2.2*   No results for input(s): LIPASE, AMYLASE in the last 168 hours. CBC: Recent Labs  Lab 06/15/20 1511 06/18/20 0020 06/19/20 1541  WBC 11.9* 9.4 10.8*  NEUTROABS  --  6.5  --   HGB 9.8* 9.5* 9.9*  HCT 32.3* 32.2* 33.5*  MCV 86.4 88.7 87.5  PLT 278 252 269   Blood Culture    Component Value Date/Time   SDES BLOOD SITE NOT SPECIFIED 04/29/2020 1210   SPECREQUEST  04/29/2020 1210    BOTTLES DRAWN AEROBIC AND ANAEROBIC Blood Culture adequate volume   CULT  04/29/2020 1210    NO GROWTH 5 DAYS Performed at [facility], [place] [address] [place], [place] [address]    REPTSTATUS 05/04/2020 FINAL 04/29/2020 1210    Cardiac Enzymes: No results for input(s): CKTOTAL, CKMB, CKMBINDEX, TROPONINI in the last 168 hours. CBG: Recent Labs  Lab  06/21/20 1141 06/21/20 1641 06/21/20 2050 06/22/20 0538 06/22/20 1128  GLUCAP 211* 161* 226* 109* 156*   Iron Studies: No results for input(s): IRON, TIBC, TRANSFERRIN, FERRITIN in the last 72 hours. '@lablastinr3'$ @ Studies/Results: No results found. Medications:  . (feeding supplement) PROSource Plus  30 mL Oral TID BM  . amLODipine  10 mg Oral QHS  . Chlorhexidine Gluconate Cloth  6 each Topical Q0600  . darbepoetin (ARANESP) injection - DIALYSIS  200 mcg Intravenous Q Tue-HD  . famotidine  20 mg Oral QHS  . feeding supplement (NEPRO CARB STEADY)  237 mL Oral BID BM  . heparin injection (subcutaneous)  7,500 Units Subcutaneous Q8H  . insulin aspart  0-9 Units Subcutaneous TID WC  . insulin glargine  11 Units Subcutaneous QHS  . lanthanum  500 mg Oral TID WC  . metoCLOPramide  5 mg Oral TID AC  . multivitamin  1 tablet Oral QHS  . pneumococcal 23 valent vaccine  0.5 mL Intramuscular Tomorrow-1000  . polyethylene glycol  17 g Oral Daily    Dialysis Orders:  NWTTS 4h 400/800 143.5kg 3K/2.5Ca TDCHeparin3000units IV TIW - Mircera135mgIVq2wks - last 12/8 - Venofer '100mg'$ IVqHD x10 - completed 6/10 doses  Assessment/Plan:  1. Sacral decub/coccygealosteo:S/p 6 weeks ofVanc/Cefepime (finished1/20/22) - ID following. In  CIR. 2. 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 ESRD:Back on usual TTS schedule,nextHDThursday. 4. Diarrhea -Recurrent issue, thought to be d/t Renvelawhich has now been stoppedbut still having issues with diarrhea on Fosrenol. Decrease doseto BID.Also d/c senna on 1/31. 5. BP/volume:Patient having issues with systolic hypertension/orthostatic hypotension. Best option is to attempt to better control HTN. Still on amlodipine. Increaseddoseto 10 mg PO q daybut changedschedule to evening after rehab activities which seems to have helped.Volume is stable but weights are trending down. Not eating  well/issues with diarrhea. UF as tolerated.No dizziness reported today.  6. COVID PNA- tested positive on 11/30, is off isolation now.  7. Anemiaof CKD: Hgb 9.5,cont Aranesp 262mgqSat.  8. Secondary Hyperparathyroidism:CorrCa10.2,use low Ca bath.Not on VDRA.Stoppedrenvela 2/2diarrhea, tryingFosrenol insteadPO4 at goal but still with diarrhea. Decreased dose to BID with largest meals.PO4 at goal. 9. Nutrition:Alb remains low, continue Nepro. 10. DMT2 - per admit   SAnice Paganini PA-C 06/22/2020, 1:15 PM  [facility] Pager: [phone] I have seen and examined this patient and agree with plan and assessment in the above note with renal recommendations/intervention highlighted.   JGovernor RooksColadonato,MD 06/22/2020 2:49 PM

## 2020-06-22 NOTE — Progress Notes (Signed)
Occupational Therapy Session Note  Patient Details  Name: Ryman Stfleur MRN: [id number] Date of Birth: [birth year]  Today's Date: 06/22/2020 OT Individual Time: [id number] Individual Time Calculation (min): 75 min    Short Term Goals: Week 2:  OT Short Term Goal 1 (Week 2): Pt will tolerate sitting OOB for 2-3 hours OT Short Term Goal 2 (Week 2): Pt will complete LB bathing and dressing with Min A w/ AE PRN OT Short Term Goal 3 (Week 2): Pt will complete transfer to [facility] with LRAD with Min A OT Short Term Goal 4 (Week 2): Pt will perform sit to stand at [facility] with Max A of 1 consistently  Skilled Therapeutic Interventions/Progress Updates:    Patient in bed, he notes that he is tired and did not sleep well last night but denies pain and agrees to complete adl tasks at this time.  LB bathing and dressing at bed level - he is able to doff LB clothing and use urinal independently.  Incontinent of bowel x2 - requires max A for hygiene and to donn clean brief, set up to donn pants.  Supine to sitting edge of bed with CS.  SB transfer to w/c CGA - cues for safe technique.  Completed UB bathing and dressing w/c level with set up, grooming tasks mod I.  He is able to propel w/c 50 feet with CS.  Completed UB ergometer 2 x 4 minutes at slow rate.  SB transfer back to bed with CGA and assist to place board.  Sit to supine CS.  He remained in bed at close of session due to fatigue.  Call bell and tray table in reach.    Therapy Documentation Precautions:  Precautions Precautions: Fall Precaution Comments: wounds on bilat feet and L sacrum, monitor orthostatic BPs Required Braces or Orthoses: Other Brace Other Brace: abdominal binder Restrictions Weight Bearing Restrictions: No   Therapy/Group: Individual Therapy  [name] 06/22/2020, 7:36 AM

## 2020-06-22 NOTE — Patient Care Conference (Signed)
Inpatient RehabilitationTeam Conference and Plan of Care Update Date: 06/22/2020   Time: 11:30 AM    Patient Name: Fred [name]      Medical Record Number: [id number]  Date of Birth: 08-Mar-1971 Sex: Male         Room/Bed: 4M05C/4M05C-01 Payor Info: Payor: MEDICARE / Plan: MEDICARE PART A AND B / Product Type: *No Product type* /    Admit Date/Time:  06/06/2020  1:05 PM  Primary Diagnosis:  Critical illness myopathy  Hospital Problems: Principal Problem:   Critical illness myopathy    Expected Discharge Date: Expected Discharge Date: 07/05/20  Team Members Present: Physician leading conference: Dr. Courtney Heys Care Coodinator Present: Dorthula Nettles, RN, BSN, CRRN;Loralee Pacas, [place] Nurse Present: Other (comment) Philip Aspen, RN) PT Present: Stacy Gardner, PT OT Present: Elisabeth Most, OT PPS Coordinator present : Ileana Ladd, Burna Mortimer, SLP     Current Status/Progress Goal Weekly Team Focus  Bowel/Bladder   Pt is oliguric. last BM-06/21/20- episode of incont. Pt continent at times.  Remain continent and prevent constipation/diarrhea  assess q shift and PRN   Swallow/Nutrition/ Hydration             ADL's   LB adl min a bed level, UB adl supervision/set up w/c level, supine to/from sit CS.  SB transfer CGA.  grooming set up w/c level  CS/Mod I w/c level  general conditioning, functional transfers with focus on lifting/wound awareness, adl training,   Mobility   CGA bed mobility, intermittent min A for sit>supine; mod A sit<>stand with stedy; tolerance to standing 45s-1 min. limited with BP and symptoms with this  Mod I WC level, CGA for dynamic standing/gait  tolerance to sitting upright and standing activitiy; strengthening; orthostatic management; transfer training   Communication             Safety/Cognition/ Behavioral Observations            Pain   Pt denies pain  Remain free of pain.  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     Discharge Planning:  Pt wants to go home with Dad and sister to assist when can. He needs to be ambulatory and not require more assist than supervision level   Team Discussion: Patient doesn't want to move a lot, always on back and asleep. CBG's trending up. BP elevated, Plan to draw labs tomorrow. BP elevated this morning, can give PRN Hydralazine. Changed dressing today, wound is beefy, undermining is present, no odor. Wounds to feet may have slough, nurse to let MD know. Incontinent bowel.  Patient on target to meet rehab goals: Bed mobility with supervision, can use slideboard with bumping. Standing with the steady min assist but, can only stand for about 1 min at a time. Patient is motivated and wants to go home.  *See Care Plan and progress notes for long and short-term goals.   Revisions to Treatment Plan:  Due to incontinence of bowel, may need bowel program.  Teaching Needs: Family education, diabetes education, medication management, wound/skin care, transfer training, gait training.  Current Barriers to Discharge: Inaccessible home environment, Decreased caregiver support, Home enviroment access/layout, Neurogenic bowel and bladder, Wound care, Lack of/limited family support, Weight, Hemodialysis, Medication compliance and Behavior  Possible Resolutions to Barriers: Continue current medications, provide emotional support  to patient and family     Medical Summary Current Status: sacral and feet wounds-looking OK- tissue undermining-beefy color; BP was up a little per RN- nausea/dizzy- B/B incontinent esp of bowel 50% of time;  Barriers to Discharge: Behavior;Decreased family/caregiver support;Home enviroment access/layout;Incontinence;Medication compliance;Medical stability;Hemodialysis;Weight;Wound care;Other  (comments);Neurogenic Bowel & Bladder  Barriers to Discharge Comments: ESRD, wounds with osteomyelitis-; wants to go home - needs to be Supervision/walking; to go home- incontinent bowel frequent with therapy; Possible Resolutions to [employer]: transfers with board bumping; progressing on standing/steady; min A to stand at best; BP gets higher during therapy- can only stand 1 minute- d/c 2/14 vs SNF   Continued Need for Acute Rehabilitation Level of Care: The patient requires daily medical management by a physician with specialized training in physical medicine and rehabilitation for the following reasons: Direction of a multidisciplinary physical rehabilitation program to maximize functional independence : Yes Medical management of patient stability for increased activity during participation in an intensive rehabilitation regime.: Yes Analysis of laboratory values and/or radiology reports with any subsequent need for medication adjustment and/or medical intervention. : Yes   I attest that I was present, lead the team conference, and concur with the assessment and plan of the team.   Cristi Loron 06/22/2020, 12:24 PM

## 2020-06-23 LAB — RENAL FUNCTION PANEL
Albumin: 2.3 g/dL — ABNORMAL LOW (ref 3.5–5.0)
Anion gap: 10 (ref 5–15)
BUN: 41 mg/dL — ABNORMAL HIGH (ref 6–20)
CO2: 26 mmol/L (ref 22–32)
Calcium: 9.1 mg/dL (ref 8.9–10.3)
Chloride: 96 mmol/L — ABNORMAL LOW (ref 98–111)
Creatinine, Ser: 5.61 mg/dL — ABNORMAL HIGH (ref 0.61–1.24)
GFR, Estimated: 12 mL/min — ABNORMAL LOW (ref >60)
Glucose, Bld: 93 mg/dL (ref 70–99)
Phosphorus: 4.5 mg/dL (ref 2.5–4.6)
Potassium: 4.7 mmol/L (ref 3.5–5.1)
Sodium: 132 mmol/L — ABNORMAL LOW (ref 135–145)

## 2020-06-23 LAB — CBC WITH DIFFERENTIAL/PLATELET
Abs Immature Granulocytes: 0.06 10*3/uL (ref 0.00–0.07)
Basophils Absolute: 0.1 10*3/uL (ref 0.0–0.1)
Basophils Relative: 1 %
Eosinophils Absolute: 0.3 10*3/uL (ref 0.0–0.5)
Eosinophils Relative: 3 %
HCT: 34.3 % — ABNORMAL LOW (ref 39.0–52.0)
Hemoglobin: 10.2 g/dL — ABNORMAL LOW (ref 13.0–17.0)
Immature Granulocytes: 1 %
Lymphocytes Relative: 23 %
Lymphs Abs: 2.3 10*3/uL (ref 0.7–4.0)
MCH: 25.9 pg — ABNORMAL LOW (ref 26.0–34.0)
MCHC: 29.7 g/dL — ABNORMAL LOW (ref 30.0–36.0)
MCV: 87.1 fL (ref 80.0–100.0)
Monocytes Absolute: 1.4 10*3/uL — ABNORMAL HIGH (ref 0.1–1.0)
Monocytes Relative: 14 %
Neutro Abs: 5.8 10*3/uL (ref 1.7–7.7)
Neutrophils Relative %: 58 %
Platelets: 251 10*3/uL (ref 150–400)
RBC: 3.94 MIL/uL — ABNORMAL LOW (ref 4.22–5.81)
RDW: 16.9 % — ABNORMAL HIGH (ref 11.5–15.5)
WBC: 9.8 10*3/uL (ref 4.0–10.5)
nRBC: 0 % (ref 0.0–0.2)

## 2020-06-23 LAB — GLUCOSE, CAPILLARY
Glucose-Capillary: 95 mg/dL (ref 70–99)
Glucose-Capillary: 111 mg/dL — ABNORMAL HIGH (ref 70–99)
Glucose-Capillary: 115 mg/dL — ABNORMAL HIGH (ref 70–99)
Glucose-Capillary: 145 mg/dL — ABNORMAL HIGH (ref 70–99)

## 2020-06-23 LAB — SEDIMENTATION RATE: Sed Rate: 73 mm/hr — ABNORMAL HIGH (ref 0–16)

## 2020-06-23 LAB — C-REACTIVE PROTEIN: CRP: 6.9 mg/dL — ABNORMAL HIGH (ref <1.0)

## 2020-06-23 MED ORDER — ONDANSETRON HCL 4 MG/2ML IJ SOLN
4.0000 mg | Freq: Four times a day (QID) | INTRAMUSCULAR | Status: DC | PRN
  Administered 2020-06-23: 4 mg via INTRAMUSCULAR

## 2020-06-23 MED ORDER — CHLORHEXIDINE GLUCONATE CLOTH 2 % EX PADS
6.0000 | MEDICATED_PAD | Freq: Every day | CUTANEOUS | Status: DC
  Administered 2020-06-23 – 2020-06-27 (×5): 6 via TOPICAL

## 2020-06-23 NOTE — Progress Notes (Signed)
Occupational Therapy Session Note  [name] MRN: [id number] Date of Birth: 1971/03/26  Today's Date: 06/23/2020 OT Individual Time: 1425-1500 OT Individual Time Calculation (min): 35 min    Short Term Goals: Week 2:  OT Short Term Goal 1 (Week 2): Pt will tolerate sitting OOB for 2-3 hours OT Short Term Goal 2 (Week 2): Pt will complete LB bathing and dressing with Min A w/ AE PRN OT Short Term Goal 3 (Week 2): Pt will complete transfer to [facility] with LRAD with Min A OT Short Term Goal 4 (Week 2): Pt will perform sit to stand at [facility] with Max A of 1 consistently  Skilled Therapeutic Interventions/Progress Updates:    Pt received supine with no c/o pain, agreeable to session. Pt completed bed mobility with supervision. Pt completed slideboard transfer to the w/c with CGA and board placement. Pt completed 300+ ft of w/c propulsion throughout the hospital with navigation of elevators and around obstacles. W/c propulsion for BUE endurance and strengthening. Edu and cueing for maximizing independence at w/c level in the community. Pt required rest breaks intermittently. He was returned to his room and left sitting up with all needs met.   Therapy Documentation Precautions:  Precautions Precautions: Fall Precaution Comments: wounds on bilat feet and L sacrum, monitor orthostatic BPs Required Braces or Orthoses: Other Brace Other Brace: abdominal binder Restrictions Weight Bearing Restrictions: No   Therapy/Group: Individual Therapy  Curtis Sites 06/23/2020, 6:35 AM

## 2020-06-23 NOTE — Progress Notes (Signed)
Physical Therapy Session Note  [name] MRN: [id number] Date of Birth: 05/07/1971  Today's Date: 06/23/2020 PT Individual Time: 1045-1130 PT Individual Time Calculation (min): 45 min   Short Term Goals: Week 3:  PT Short Term Goal 1 (Week 3): Pt will transfer bed<>chair with LRAD and min A PT Short Term Goal 2 (Week 3): Pt will transfer sit<>stand with LRAD and mod A from WC PT Short Term Goal 3 (Week 3): pt will perform all bed mobility with CGA  Skilled Therapeutic Interventions/Progress Updates: Pt presented in bed agreeable to therapy. Pt states didn't have best am but wanted to work on standing some more. Pt performed supine to sit with CGA and HOB slightly elevated. Abdominal binder donned while sitting EOB. Pt performed x 4 STS from [facility] with bed elevated due to pt's height. Pt's first stand required modA with remaining 3 requiring minA. Pt responded well to cues to increase forward flexion to shift COG and multimodal cues for anterior translation of hips to promote erect posture. Pt stood ~20sec, ~45sec, ~30 sec x 2 respectively with pt c/o dizziness/lightheadedness with extended standing which resolved with extended seated rest. On last stand pt noted to be incontinent of bowels, thus once completed pt returned to supine with CGA and increased effort. Pt was able to pull pants below hips and performed rolling L/R with supervision and use of bed rails as PTA performed peri-care total A. Pt repositioned self to comfort and left in supine with call bell within reach and needs met.   BP supine: 139/98 (110) HR 90 BP sitting EOB: 125/99 (109) HR 101 BP after standing: 104/88 (96) HR 104     Therapy Documentation Precautions:  Precautions Precautions: Fall Precaution Comments: wounds on bilat feet and L sacrum, monitor orthostatic BPs Required Braces or Orthoses: Other Brace Other Brace: abdominal binder Restrictions Weight Bearing Restrictions:  No General:   Vital Signs: Therapy Vitals Temp: 98.5 F (36.9 C) Pulse Rate: 92 Resp: 18 BP: 134/86 Patient Position (if appropriate): Lying Oxygen Therapy SpO2: 99 % O2 Device: Room Air   Therapy/Group: Individual Therapy  Kiandre Spagnolo  Ubah Radke, PTA  06/23/2020, 3:27 PM

## 2020-06-23 NOTE — Progress Notes (Addendum)
[facility] Progress Note   Subjective:   Pt seen in room. Reports ongoing diarrhea. Noted he is having bowel incontinence overnight. No SOB, CP, or palpitations. Still has intermittent dizziness during therapy and pt expresses concern that he is not progressing with therapy as he would like.    Objective Vitals:   06/22/20 1546 06/22/20 1722 06/22/20 2005 06/23/20 0529  BP: 115/62 126/89 (!) 138/95 136/84  Pulse:   100 97  Resp: '17  18 18  '$ Temp: 98.6 F (37 C)  98.6 F (37 C) 98.2 F (36.8 C)  TempSrc: Oral  Oral Oral  SpO2: 96%  99% 100%  Weight:      Height:       Physical Exam  General:WDWN male, alert and in NAD Heart:RRR, no murmurs, rubs or gallops Lungs:CTA bilaterally without wheezing, rhonchi or rales Abdomen:Soft, non-tender, non-distended, +BS Extremities:No edema b/l lower extremities Dialysis Access:R IJ [facility]  Additional Objective Labs: Basic Metabolic Panel: Recent Labs  Lab 06/18/20 0020 06/19/20 1541 06/23/20 0559  NA 135 132* 132*  K 4.5 4.6 4.7  CL 95* 96* 96*  CO2 '25 25 26  '$ GLUCOSE 124* 194* 93  BUN 54* 50* 41*  CREATININE 6.89* 6.69* 5.61*  CALCIUM 8.8* 8.9 9.1  PHOS 3.9 4.4 4.5   Liver Function Tests: Recent Labs  Lab 06/18/20 0020 06/19/20 1541 06/23/20 0559  ALBUMIN 2.1* 2.2* 2.3*   CBC: Recent Labs  Lab 06/18/20 0020 06/19/20 1541 06/23/20 0559  WBC 9.4 10.8* 9.8  NEUTROABS 6.5  --  5.8  HGB 9.5* 9.9* 10.2*  HCT 32.2* 33.5* 34.3*  MCV 88.7 87.5 87.1  PLT 252 269 251   Blood Culture    Component Value Date/Time   SDES BLOOD SITE NOT SPECIFIED 04/29/2020 1210   SPECREQUEST  04/29/2020 1210    BOTTLES DRAWN AEROBIC AND ANAEROBIC Blood Culture adequate volume   CULT  04/29/2020 1210    NO GROWTH 5 DAYS Performed at [facility] [address], [place], [place] [address]    REPTSTATUS 05/04/2020 FINAL 04/29/2020 1210   CBG: Recent Labs  Lab 06/22/20 0538 06/22/20 1128 06/22/20 1639  06/22/20 2109 06/23/20 0557  GLUCAP 109* 156* 101* 127* 95   Medications:  . (feeding supplement) PROSource Plus  30 mL Oral TID BM  . amLODipine  10 mg Oral QHS  . Chlorhexidine Gluconate Cloth  6 each Topical Q0600  . darbepoetin (ARANESP) injection - DIALYSIS  200 mcg Intravenous Q Tue-HD  . famotidine  20 mg Oral QHS  . feeding supplement (NEPRO CARB STEADY)  237 mL Oral BID BM  . heparin injection (subcutaneous)  7,500 Units Subcutaneous Q8H  . insulin aspart  0-9 Units Subcutaneous TID WC  . insulin glargine  11 Units Subcutaneous QHS  . lanthanum  500 mg Oral TID WC  . metoCLOPramide  5 mg Oral TID AC  . multivitamin  1 tablet Oral QHS  . pneumococcal 23 valent vaccine  0.5 mL Intramuscular Tomorrow-1000  . polyethylene glycol  17 g Oral Daily    Dialysis Orders: NWTTS 4h 400/800 143.5kg 3K/2.5Ca TDCHeparin3000units IV TIW - Mircera141mgIVq2wks - last 12/8 - Venofer '100mg'$ IVqHD x10 - completed 6/10 doses  Assessment/Plan: 1. Sacral decub/coccygealosteo:S/p 6 weeks ofVanc/Cefepime (finished1/20/22) - ID following. In CIR. 2. 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 ESRD:Back on usual TTS schedule,nextHDThursday. Does tolerate HD well without dizziness. 4. Diarrhea -Recurrent issue, thought to be d/t Renvelawhich has now  been stoppedbut still having issues with diarrhea on Fosrenol. Senna also stopped. Primary team suspects poor sphincter tone 5. BP/volume: Overall BP control is improved.Reports dizziness with standing intermittently. Dizziness is not consistently associated with lower blood pressures. Suspect he is still acclimating to normal BP and possibly debility is contributing to his dizzy sensation with exertion. Continue current regimen for now.  6. COVID PNA- tested positive on 11/30, is off isolation now.  7. Anemiaof CKD: Hgb 10.2,cont Aranesp 211mgqSat.  8. Secondary  Hyperparathyroidism:CorrCaelevated,use low Ca bath.Not on VDRA.Stoppedrenvela 2/2diarrhea, tryingFosrenol insteadPO4 at goal but still with diarrhea, not convinced binders are the issue since he has also had periods of constipation while on fosrenol.PO4 at goal. 9. Nutrition:Alb remains low, continue Nepro. 10. DMT2 - per admit   SAnice Paganini PA-C 06/23/2020, 11:01 AM  [facility] Pager: [phone] I have seen and examined this patient and agree with plan and assessment in the above note with renal recommendations/intervention highlighted.   JBroadus JohnA Sherise Geerdes,MD 06/23/2020 2:42 PM

## 2020-06-23 NOTE — Progress Notes (Signed)
Physical Therapy Note  Patient Details  Name: Birt Biedrzycki MRN: [id number] Date of Birth: 1970-06-23 Today's Date: 06/23/2020    PT unavailable for patient's session due to assisting another patient in distress. Will attempt to make up missed time during session tomorrow.   Janaiyah Blackard L Apurva Reily PT, DPT  06/23/2020, 4:31 PM

## 2020-06-23 NOTE — Progress Notes (Signed)
[place] PHYSICAL MEDICINE & REHABILITATION PROGRESS NOTE   Subjective/Complaints:  Per therapy, and nursing, pt having bowel incontinence even when sleeping- large amounts- it sounds like an incompetent sphincter more than anything even with decrease in bowel meds.   Will stop Senokot now, but discussed with pt about a scheduled bowel program to help with incontinence- he said would think about it.  ROS:  Pt denies SOB, abd pain, CP, N/V/C/D, and vision changes    Objective:   No results found. Recent Labs    06/23/20 0559  WBC 9.8  HGB 10.2*  HCT 34.3*  PLT 251   Recent Labs    06/23/20 0559  NA 132*  K 4.7  CL 96*  CO2 26  GLUCOSE 93  BUN 41*  CREATININE 5.61*  CALCIUM 9.1    Intake/Output Summary (Last 24 hours) at 06/23/2020 0851 Last data filed at 06/23/2020 0836 Gross per 24 hour  Intake 240 ml  Output 2600 ml  Net -2360 ml     Pressure Injury 05/01/20 Buttocks Left Stage 4 - Full thickness tissue loss with exposed bone, tendon or muscle. packed with santyl gauze, dressed by WOCN, (Active)  05/01/20 0230  Location: Buttocks  Location Orientation: Left  Staging: Stage 4 - Full thickness tissue loss with exposed bone, tendon or muscle.  Wound Description (Comments): packed with santyl gauze, dressed by WOCN,  Present on Admission: Yes     Pressure Injury 05/01/20 Ankle Left;Lateral Unstageable - Full thickness tissue loss in which the base of the injury is covered by slough (yellow, tan, gray, green or brown) and/or eschar (tan, brown or black) in the wound bed. smal circular wound on the (Active)  05/01/20 0230  Location: Ankle  Location Orientation: Left;Lateral  Staging: Unstageable - Full thickness tissue loss in which the base of the injury is covered by slough (yellow, tan, gray, green or brown) and/or eschar (tan, brown or black) in the wound bed.  Wound Description (Comments): smal circular wound on the bone  Present on Admission: Yes      Pressure Injury 05/01/20 Foot Left;Posterior Unstageable - Full thickness tissue loss in which the base of the injury is covered by slough (yellow, tan, gray, green or brown) and/or eschar (tan, brown or black) in the wound bed. 4th and 5th toes appear n (Active)  05/01/20 0230  Location: Foot (4th and 5th toes and lateral/anterior side of the sole)  Location Orientation: Left;Posterior  Staging: Unstageable - Full thickness tissue loss in which the base of the injury is covered by slough (yellow, tan, gray, green or brown) and/or eschar (tan, brown or black) in the wound bed.  Wound Description (Comments): 4th and 5th toes appear necrotic circular wound on sole  Present on Admission: Yes     Pressure Injury 05/01/20 Foot Right;Medial;Posterior Unstageable - Full thickness tissue loss in which the base of the injury is covered by slough (yellow, tan, gray, green or brown) and/or eschar (tan, brown or black) in the wound bed. large circular wo (Active)  05/01/20 0230  Location: Foot  Location Orientation: Right;Medial;Posterior  Staging: Unstageable - Full thickness tissue loss in which the base of the injury is covered by slough (yellow, tan, gray, green or brown) and/or eschar (tan, brown or black) in the wound bed.  Wound Description (Comments): large circular wound on sole  and heel  Present on Admission: Yes     Pressure Injury 05/08/20 Heel Right Unstageable - Full thickness tissue loss in which  the base of the injury is covered by slough (yellow, tan, gray, green or brown) and/or eschar (tan, brown or black) in the wound bed. (Active)  05/08/20 1718  Location: Heel  Location Orientation: Right  Staging: Unstageable - Full thickness tissue loss in which the base of the injury is covered by slough (yellow, tan, gray, green or brown) and/or eschar (tan, brown or black) in the wound bed.  Wound Description (Comments):   Present on Admission: Yes    Physical Exam: Vital Signs Blood  pressure 136/84, pulse 97, temperature 98.2 F (36.8 C), temperature source Oral, resp. rate 18, height 6' 6" (1.981 m), weight (!) 153.8 kg, SpO2 100 %.   Physical Exam   General: asleep again- doesn't like to talk in AM- laying in specialty bed supine again, NAD Mood and affect are still flat Heart: RRR Lungs: CTA B/L- no W/R/R- good air movement Abdomen: Soft, NT, ND, (+)BS  Extremities: No clubbing, cyanosis, or edema Skin: has stable pressure ulcers on feet and sacrum Neurological: Ox3    Comments: Patient is alert in no acute distress. 5/5 strength throughout.     Assessment/Plan: 1. Functional deficits which require 3+ hours per day of interdisciplinary therapy in a comprehensive inpatient rehab setting.  Physiatrist is providing close team supervision and 24 hour management of active medical problems listed below.  Physiatrist and rehab team continue to assess barriers to discharge/monitor patient progress toward functional and medical goals  Care Tool:  Bathing    Body parts bathed by patient: Right arm,Left arm,Chest,Abdomen,Right upper leg,Left upper leg,Face   Body parts bathed by helper: Right lower leg,Left lower leg,Buttocks     Bathing assist Assist Level: Minimal Assistance - Patient > 75% (declined LB today, EOB level)     Upper Body Dressing/Undressing Upper body dressing   What is the patient wearing?: Pull over shirt    Upper body assist Assist Level: Set up assist    Lower Body Dressing/Undressing Lower body dressing      What is the patient wearing?: Pants,Incontinence brief     Lower body assist Assist for lower body dressing: Minimal Assistance - Patient > 75%     Toileting Toileting    Toileting assist Assist for toileting: Maximal Assistance - Patient 25 - 49% Assistive Device Comment:  (urinal)   Transfers Chair/bed transfer  Transfers assist  Chair/bed transfer activity did not occur: Safety/medical concerns (Per nursing  report)  Chair/bed transfer assist level: Moderate Assistance - Patient 50 - 74% Chair/bed transfer assistive device: Sliding board   Locomotion Ambulation   Ambulation assist   Ambulation activity did not occur: Safety/medical concerns (fatigue, lightheadedness, generalized weakness)          Walk 10 feet activity   Assist  Walk 10 feet activity did not occur: Safety/medical concerns (fatigue, lightheadedness, generalized weakness)        Walk 50 feet activity   Assist Walk 50 feet with 2 turns activity did not occur: Safety/medical concerns (fatigue, lightheadedness, generalized weakness)         Walk 150 feet activity   Assist Walk 150 feet activity did not occur: Safety/medical concerns (fatigue, lightheadedness, generalized weakness)         Walk 10 feet on uneven surface  activity   Assist Walk 10 feet on uneven surfaces activity did not occur: Safety/medical concerns (fatigue, lightheadedness, generalized weakness)         Wheelchair     Assist Will patient use wheelchair at discharge?:  Yes Type of Wheelchair: Manual    Wheelchair assist level: Supervision/Verbal cueing Max wheelchair distance: 100    Wheelchair 50 feet with 2 turns activity    Assist        Assist Level: Supervision/Verbal cueing   Wheelchair 150 feet activity     Assist      Assist Level: Minimal Assistance - Patient > 75%   Blood pressure 136/84, pulse 97, temperature 98.2 F (36.8 C), temperature source Oral, resp. rate 18, height 6' 6" (1.981 m), weight (!) 153.8 kg, SpO2 100 %.  Medical Problem List and Plan: 1.  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 2.  Antithrombotics: -DVT/anticoagulation: Subcutaneous heparin.  Check vascular study             -antiplatelet therapy: N/A 3. Pain Management: Tylenol as needed  1/31- pain controlled-  con't regimen 4. Mood: Provide emotional support             -antipsychotic agents: N/A 5. Neuropsych: This patient is? capable of making decisions on his own behalf.  2/1- disinclined to talk with examiner- but appears more Ox3 6. Skin/Wound Care: Routine skin checks 7. Fluids/Electrolytes/Nutrition: Routine in and outs with follow-up chemistries 8.  End-stage renal disease.  Hemodialysis per renal services 9.  Sacral decubitus ulcer as well as ischemic ulcers on the left foot fourth toe and fifth metatarsal head and right heel and fifth metatarsal head.  Wound care nurse follow-up.  Patient to see Dr. Sharol Given outpatient.  Continue bilateral PRAFO's. 10.  ID/ coccygeal osteomyelitis.  Continue vancomycin and cefepime through 06/10/2020  1/17- WBC 8.1  1/18- will schedule turning q2 hours to get off wounds- also will make sure on specialty bed  1/20- is on specialty bed  1/27- off IV ABX for osteomyletis- WBC slightly elevated at 11.9- will check CRP and ESR for baseline?  1/28- WBC down to 9.5- CRP pending, but ESR is 30- which is great considering wounds  2/1- WBC up to 10.8- will recheck CRP/ESR and go from there.   2/2- WBC down to 9.8- ESR up somewhat to 73 and CRP down to 6.9- will con't to monitor, since getting some values up; some down-  10.  Anemia of chronic disease.  Continue Aranesp. Hgb 8.8 on 1/16, repeat with HD  1/17- no CBC today, only BMP- will try and check with HD on Tuesday.   1/19- doing better based on labs yesterday  1/26- Hb 9.8- con't regimen 11.  Diabetes mellitus with peripheral neuropathy .  Hemoglobin A1c 5.6.  Lantus insulin 14 units nightly  1/22- seen by Diabetes coordinator they want me to decrease Lantus to 8 units, however Bgs running 86-120- which is ideal- will wait to decrease esp with all his wounds-  CBG (last 3)  Recent Labs    06/22/20 1639 06/22/20 2109 06/23/20 0557  GLUCAP 101* 127* 95  2/1- BGs 109-226- labile- more elevated than had been-  will give 1 more day, and if still up, will titrate up on Lantus 2.2- BG much better today 95-127- con't regimen 12.  Morbid obesity.  BMI 38.37.  Dietary follow-up  13. Dizziness/nausea with HTN  1/17- Sx's so significant, vomited this AM when got up- will try abd binder and ACE wraps for LEs- since due to osteomyelitis, don't think can use TEDs- if need be, can add Midodrine,  but due to ESRD, cannot start Florinef.  1/18- BP laying is 164/101 this AM- might not do well with midodrine?  1/19- since not clear if actually orthostatic, per renal, will order daily orthostatics x7 days.   1/20- BP 177/108 this AM- used to be on Metoprolol and higher dose of  Norvasc- will give 1x time Metoprolol 12.5 mg since BP so high AND pulse is in 80s  1/23- stating to think he's so used to really high BP, so when comes down to more normal level, he's hypotensive.   1/25- BP slightly elevated- when c/o dizziness, BP not lower- but Sx's are improving- con't to monitor  1/27- will order hydralazine 25 mg PO q6 hours prn for SBP >180 or DBP >110- really think dizziness is "functional orthostasis" since his body is used to such high Bps     Vitals:   06/22/20 2005 06/23/20 0529  BP: (!) 138/95 136/84  Pulse: 100 97  Resp: 18 18  Temp: 98.6 F (37 C) 98.2 F (36.8 C)  SpO2: 99% 100%   2/1- BP 153./92 this AM- and 140s/80 yesterday evening- doing better- will con't regimen for now.  2/2- added Hydralazine prn back- also renal increased Norvasc to 10 mg daily- con't regimen 14. Diarrhea with new constipation  1/18- LBM 3 days ago- on imodium 2 mg q4 hours around the clock- will change to q8 hours to see if can get him to have BM.  1/19- wil give Sorbitol 60 ml today since no BM- maintain lower dose of imodium per pt request.  1/20- thinks sorbitol made him sick- it's actually the ileus that made him sick- refusing NG tube- will give Suppository per pt request (KUB shows likely ileus) and changed diet to clear  liquid diet for now. .   Pt states he was on 3 stool softeners on acute care which worked for him  Will start senna S 2 po BID   1/25- restarted miralax daily- pt scared of loose stools, but said will "try it"".  1/26- had 2 large Bms and 1 tiny BM- finally working- con't regimen-don't stop miralax/sorbitol right now.  1.27- 2-3 more BMs yesterday- con't regimen- looking better   1/31- had 2 BMs that were accidents- will back off Senokot to 1 tab BID and con't Miralax  2/1- still having BMs- frequent, but pt willing to wait a little to see if titration yesterday fixed things.   2/2- having more incontinence than knew- sounds like incompetent sphincter??? Discussed bowel program- wants to think about it- will d/c senokot 15. Leukocytosis  1/26- will check CXR since WBC up to 11.9- Tm 99.1- not elevated.   1/27- CXR showed minimal atalectasis- will monitor- will also recheck labs in AM with ESR/CRP due to wounds.   sedrate mild elevation but unchanged vs 1 mo ago  2/1- WBC 10.8- will recheck labs in AM with CRP and ESR  2/2- WBC 9.8- better-      LOS: 17 days A FACE TO FACE EVALUATION WAS PERFORMED  Megan Lovorn 06/23/2020, 8:51 AM

## 2020-06-23 NOTE — Progress Notes (Signed)
Nutrition Follow-up  RD working remotely.  DOCUMENTATION CODES:   Morbid obesity  INTERVENTION:   -Continue renal MVI daily  -ContinueProSource Plus30 ml poTID, each supplement provides 100 kcals and 15 grams protein  -ContinueNepro Shake poBID, each supplement provides 425 kcal and 19 grams protein  -Continue double protein portions at each meal  NUTRITION DIAGNOSIS:   Increased nutrient needs related to wound healing as evidenced by estimated needs.  Ongoing  GOAL:   Patient will meet greater than or equal to 90% of their needs  Progressing  MONITOR:   PO intake,Supplement acceptance,Labs,Weight trends,Skin,I & O's  REASON FOR ASSESSMENT:   Malnutrition Screening Tool    ASSESSMENT:   50 year old right-handed male with PMH of HTN, DM, ESRD on HD. Pt admitted with critical illness/mypoathy/debilitation secondary to COVID-19.  1/20 - KUB showing possible ileus, diet changed to clear liquids 1/21 - diet advanced back to carb modified  Per notes, pt having bowel incontinence even when sleeping. Per MD, might be poor sphincter tone. Pt considering bowel program.  Last HD on 2/1 with 2500 ml net UF.  RD unable to reach pt via phone call to room. Pt accepting most ProSource and Nepro supplements per [facility] documentation. Will continue with current supplement regimen as well as double protein portions with meals to aid in wound healing.  CIR admit weight: 158.8 kg Current weight (as of 06/20/20): 153.8 kg  Meal Completion: 0-100% x last 8 documented meals (averaging 58%)  Medications reviewed and include: ProSource Plus TID, aranesp, pepcid, Nepro BID, SSI, lantus 11 units daily, fosrenol, reglan, rena-vit, miralax  Labs reviewed: sodium 132 CBG's: 95-127 x 24 hours  UOP: 100 ml x 24 hours  Diet Order:   Diet Order            Diet Carb Modified Fluid consistency: Thin; Room service appropriate? Yes  Diet effective now                  EDUCATION NEEDS:   No education needs have been identified at this time  Skin:  Skin Assessment: Skin Integrity Issues: Stage IV: left buttock Unstageable: left lateral ankle, left posterior foot, right medial foot, right heel  Last BM:  06/22/20  Height:   Ht Readings from Last 1 Encounters:  06/06/20 '6\' 6"'$  (1.981 m)    Weight:   Wt Readings from Last 1 Encounters:  06/05/20 (!) 161.9 kg    Ideal Body Weight:  97.3 kg  BMI:  Body mass index is 39.18 kg/m.  Estimated Nutritional Needs:   Kcal:  [id number]  Protein:  165-195 grams  Fluid:  1.2 L    Gustavus Bryant, MS, RD, LDN Inpatient Clinical Dietitian Please see [facility] for contact information.

## 2020-06-23 NOTE — Progress Notes (Signed)
Patient ID: Fred [name], male   DOB: [birth year], 50 y.o.   MRN: [id number]  This SW covering for primary SW, Blanchard.  SW returned phone call to pt sister [name] to answer questions related to patient discharge and his overall care needs. She wanted to know if he is able to practice walking, and even manuervering a atleast five steps to get into the bathroom since there are concerns about doorways and hallways. SW informed will relay concerns to therapy team as they can answer if there is any chance he can walk/take steps. SW reiterated based on updates provided he will require a w/c level at discharge. SW encouraged family education sooner than later so the family can get a better idea on how well he is doing, and what areas he will need help with. Fam edu scheduled for Monday 2/7 1pm-3pm with pt sister and father.   Loralee Pacas, MSW, [place] Office: [phone] Cell: [phone] Fax: [phone]

## 2020-06-23 NOTE — Progress Notes (Signed)
Physical Therapy Session Note  [name] MRN: [id number] Date of Birth: 01-04-1971  Today's Date: 06/23/2020 PT Individual Time: 0800-0855 PT Individual Time Calculation (min): 55 min   Short Term Goals: Week 3:  PT Short Term Goal 1 (Week 3): Pt will transfer bed<>chair with LRAD and min A PT Short Term Goal 2 (Week 3): Pt will transfer sit<>stand with LRAD and mod A from WC PT Short Term Goal 3 (Week 3): pt will perform all bed mobility with CGA  Skilled Therapeutic Interventions/Progress Updates:    Pt received sidelying in bed asleep, arousable and agreeable to PT session. No complaints of pain. Supine BP 143/94 (110), HR 99 but pt reports decrease in BP and onset of lightheadedness when sitting up. Assisted pt with donning BLE ACE wraps. Supine to sit with Supervision with increased time with use of bedrails, HOB slightly elevated. Pt reports some lightheadedness initially when sitting up. Seated BP 107/80. Assisted pt with donning abdominal binder in sitting. Sit to stand with mod A to stedy from elevated bed. Pt reports onset of lightheadedness in standing. Seated BP 101/81 following standing. Sit to stand x 5 more reps to stedy from elevated bed with max A required due to pt fatigue. Standing mini-squats 2 x 5 reps to fatigue. Pt continues to have onset of lightheadedness in standing as well as reports seeing "gray and black" with blurry vision. Symptoms initially subside while seated EOB. Seated BLE strengthening therex: marches 3 x 10 reps, LAQ x 10 reps, x 5 reps before pt reports onset of dark vision again. Sit to supine Supervision with increased time with use of bedrail. Pt reports symptoms improve once in supine. Pt agreeable to sit up in bed in chair position to work on improving upright tolerance. Pt left seated in bed with needs in reach at end of session, instructions to utilize bed controls to return to supine if symptoms return.  Therapy  Documentation Precautions:  Precautions Precautions: Fall Precaution Comments: wounds on bilat feet and L sacrum, monitor orthostatic BPs Required Braces or Orthoses: Other Brace Other Brace: abdominal binder Restrictions Weight Bearing Restrictions: No    Therapy/Group: Individual Therapy   Excell Seltzer, PT, DPT  06/23/2020, 12:05 PM

## 2020-06-24 LAB — RENAL FUNCTION PANEL
Albumin: 2.3 g/dL — ABNORMAL LOW (ref 3.5–5.0)
Anion gap: 13 (ref 5–15)
BUN: 59 mg/dL — ABNORMAL HIGH (ref 6–20)
CO2: 25 mmol/L (ref 22–32)
Calcium: 9.5 mg/dL (ref 8.9–10.3)
Chloride: 93 mmol/L — ABNORMAL LOW (ref 98–111)
Creatinine, Ser: 7.62 mg/dL — ABNORMAL HIGH (ref 0.61–1.24)
GFR, Estimated: 8 mL/min — ABNORMAL LOW (ref >60)
Glucose, Bld: 181 mg/dL — ABNORMAL HIGH (ref 70–99)
Phosphorus: 5 mg/dL — ABNORMAL HIGH (ref 2.5–4.6)
Potassium: 5.3 mmol/L — ABNORMAL HIGH (ref 3.5–5.1)
Sodium: 131 mmol/L — ABNORMAL LOW (ref 135–145)

## 2020-06-24 LAB — CBC
HCT: 32.3 % — ABNORMAL LOW (ref 39.0–52.0)
Hemoglobin: 10 g/dL — ABNORMAL LOW (ref 13.0–17.0)
MCH: 26.3 pg (ref 26.0–34.0)
MCHC: 31 g/dL (ref 30.0–36.0)
MCV: 85 fL (ref 80.0–100.0)
Platelets: 255 10*3/uL (ref 150–400)
RBC: 3.8 MIL/uL — ABNORMAL LOW (ref 4.22–5.81)
RDW: 16.5 % — ABNORMAL HIGH (ref 11.5–15.5)
WBC: 9.9 10*3/uL (ref 4.0–10.5)
nRBC: 0 % (ref 0.0–0.2)

## 2020-06-24 LAB — GLUCOSE, CAPILLARY
Glucose-Capillary: 101 mg/dL — ABNORMAL HIGH (ref 70–99)
Glucose-Capillary: 190 mg/dL — ABNORMAL HIGH (ref 70–99)
Glucose-Capillary: 93 mg/dL (ref 70–99)
Glucose-Capillary: 163 mg/dL — ABNORMAL HIGH (ref 70–99)

## 2020-06-24 MED ORDER — HEPARIN SODIUM (PORCINE) 1000 UNIT/ML IJ SOLN
INTRAMUSCULAR | Status: AC
  Filled 2020-06-24: qty 4

## 2020-06-24 MED ORDER — AMLODIPINE BESYLATE 5 MG PO TABS
7.5000 mg | ORAL_TABLET | Freq: Every day | ORAL | Status: DC
  Administered 2020-06-24 – 2020-07-01 (×7): 7.5 mg via ORAL
  Filled 2020-06-24 (×7): qty 1

## 2020-06-24 NOTE — NC FL2 (Addendum)
[place] LEVEL OF CARE SCREENING TOOL     IDENTIFICATION  Patient Name: Fred [name] Birthdate: December 03, 1970 Sex: male Admission Date (Current Location): 06/06/2020  [facility] and [place] Number:  Herbalist and Address:  [facility], [place] [address] [place], [place] [address]      Provider Number: [id number]  Attending Physician Name and Address:  Courtney Heys, MD  Relative Name and Phone Number:  [name], sister, [phone]    Current Level of Care: Hospital Recommended Level of Care: [place] Prior Approval Number:    Date Approved/Denied:   PASRR Number: [id number]  Discharge Plan: SNF    Current Diagnoses: Patient Active Problem List   Diagnosis Date Noted  . 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 Avascular necrosis of first metatarsal, left foot ([place]) 04/30/2020  . Osteomyelitis of dorsal first metatarsal, left foot ([place]) 04/30/2020  . Decubitus ulcer of sacral region, unstageable ([place]) 04/30/2020  . Morbid obesity ([place]) 04/30/2020  . Multiple open wounds of foot   . Insulin dependent type 2 diabetes mellitus ([place]) 04/29/2020  . HTN (hypertension) 04/29/2020  . History of anemia due to chronic kidney disease 04/29/2020  . Sacral osteomyelitis ([place]) 04/29/2020  . Pressure injury of both heels, unstageable ([place]) 04/29/2020  . Pneumonia due to COVID-19 virus 04/29/2020  . ESRD (end stage renal disease) ([place]) 04/29/2020    Orientation RESPIRATION BLADDER Height & Weight     Self,Time,Situation,Place  Normal Continent Weight:  (bed scale broken) Height:  '6\' 6"'$  (198.1 cm)  BEHAVIORAL SYMPTOMS/MOOD NEUROLOGICAL BOWEL NUTRITION STATUS      Continent Diet (modified carb diet)  AMBULATORY STATUS COMMUNICATION OF NEEDS Skin   Extensive Assist Verbally Other (Comment) (Feet and sacram- see below)                       Personal Care Assistance Level of Assistance  Bathing,Dressing Bathing  Assistance: Limited assistance Feeding assistance: Limited assistance Dressing Assistance: Limited assistance     Functional Limitations Info             SPECIAL CARE FACTORS FREQUENCY  Blood pressure,PT (By licensed PT),OT (By licensed OT) Blood Pressure Frequency: orthostaic BPs; pt must wear abdominal binder when up, and when participating in therapy.   PT Frequency: 5xs per week OT Frequency: 5xs per week            Contractures Contractures Info: Not present    Additional Factors Info  Code Status,Allergies Code Status Info: FULL Allergies Info: Atorvastatin           Current Medications (06/24/2020):  This is the current hospital active medication list Current Facility-Administered Medications  Medication Dose Route Frequency Provider Last Rate Last Admin  . (feeding supplement) PROSource Plus liquid 30 mL  30 mL Oral TID BM Angiulli, Lavon Paganini, PA-C   30 mL at 06/23/20 1831  . acetaminophen (TYLENOL) tablet 650 mg  650 mg Oral Q6H PRN Cathlyn Parsons, PA-C   650 mg at 06/07/20 1445  . amLODipine (NORVASC) tablet 7.5 mg  7.5 mg Oral QHS Lovorn, Megan, MD      . bisacodyl (DULCOLAX) suppository 10 mg  10 mg Rectal Daily PRN Lovorn, Jinny Blossom, MD   10 mg at 06/13/20 1541  . Chlorhexidine Gluconate Cloth 2 % PADS 6 each  6 each Topical Q0600 Janalee Dane, PA-C   6 each at 06/23/20 [id number]  . Chlorhexidine Gluconate Cloth 2 % PADS  6 each  6 each Topical Q0600 Janalee Dane, PA-C   6 each at 06/24/20 [id number]  . Darbepoetin Alfa (ARANESP) injection 200 mcg  200 mcg Intravenous Q Tue-HD Lovorn, Megan, MD   200 mcg at 06/22/20 1603  . famotidine (PEPCID) tablet 20 mg  20 mg Oral QHS Cathlyn Parsons, PA-C   20 mg at 06/23/20 2145  . feeding supplement (NEPRO CARB STEADY) liquid 237 mL  237 mL Oral BID BM AngiulliLavon Paganini, PA-C   237 mL at 06/23/20 1000  . heparin injection 7,500 Units  7,500 Units Subcutaneous Q8H Cathlyn Parsons, PA-C   7,500 Units at 06/24/20  [id number]  . hydrALAZINE (APRESOLINE) tablet 25 mg  25 mg Oral Q8H PRN Lovorn, Megan, MD      . insulin aspart (novoLOG) injection 0-9 Units  0-9 Units Subcutaneous TID WC Angiulli, Lavon Paganini, PA-C   2 Units at 06/22/20 1256  . insulin glargine (LANTUS) injection 11 Units  11 Units Subcutaneous QHS Lovorn, Jinny Blossom, MD   11 Units at 06/23/20 2145  . lanthanum (FOSRENOL) chewable tablet 500 mg  500 mg Oral TID WC Penninger, Lindsay, PA   500 mg at 06/24/20 0809  . loperamide (IMODIUM) capsule 2 mg  2 mg Oral Q8H PRN Lovorn, Megan, MD      . metoCLOPramide (REGLAN) tablet 5 mg  5 mg Oral TID AC Angiulli, Lavon Paganini, PA-C   5 mg at 06/24/20 [id number]  . multivitamin (RENA-VIT) tablet 1 tablet  1 tablet Oral QHS Cathlyn Parsons, PA-C   1 tablet at 06/23/20 2145  . ondansetron (ZOFRAN) tablet 4 mg  4 mg Oral Q6H PRN Cathlyn Parsons, PA-C   4 mg at 06/24/20 0810   Or  . ondansetron (ZOFRAN) injection 4 mg  4 mg Intravenous Q6H PRN Cathlyn Parsons, PA-C   4 mg at 06/12/20 1235  . pneumococcal 23 valent vaccine (PNEUMOVAX-23) injection 0.5 mL  0.5 mL Intramuscular Tomorrow-1000 Lovorn, Megan, MD      . polyethylene glycol (MIRALAX / GLYCOLAX) packet 17 g  17 g Oral Daily Lovorn, Megan, MD   17 g at 06/17/20 0939  . prochlorperazine (COMPAZINE) tablet 10 mg  10 mg Oral Q6H PRN Lovorn, Jinny Blossom, MD   10 mg at 06/11/20 [id number]     Discharge Medications: Please see discharge summary for a list of discharge medications.  Relevant Imaging Results:  Relevant Lab Results:   Additional Information SS#: [id number]. Wound care needs: 1) feet-Wash both feet with soap and water, rinse and pat dry. Apply betadine to all unstageable areas of the feet by painting on thoroughly and allowing to air dry. Apply daily. Place both feet in Prevalon boots. 2) sacram-Insert saline moistened gauze into the sacral wound.  Be sure to place into the undermined areas all around the edge of the wound. Cover with ABD pads, tape in  place.  ESRD-TTS  Rana Snare, LCSW

## 2020-06-24 NOTE — Progress Notes (Addendum)
[facility] Progress Note   Subjective:   Had orthostatic hypotension this AM with therapy. Now feeling better. Denies SOB, CP, palpitations, dizziness and nausea.   Objective Vitals:   06/23/20 1312 06/23/20 1949 06/24/20 0526 06/24/20 1027  BP: 134/86 130/82 119/83 110/72  Pulse: 92 100 92 (!) 103  Resp: '18 20 18 17  '$ Temp: 98.5 F (36.9 C) 98.2 F (36.8 C) 98.7 F (37.1 C) (!) 97.5 F (36.4 C)  TempSrc:    Oral  SpO2: 99% 99% 99% 100%  Weight:      Height:       Physical Exam General:WDWN male, alert and in NAD Heart:RRR, no murmurs, rubs or gallops Lungs:CTA bilaterally without wheezing, rhonchi or rales Abdomen:Soft, non-tender, non-distended, +BS Extremities:No edema b/l lower extremities Dialysis Access:R IJ [facility]  Additional Objective Labs: Basic Metabolic Panel: Recent Labs  Lab 06/18/20 0020 06/19/20 1541 06/23/20 0559  NA 135 132* 132*  K 4.5 4.6 4.7  CL 95* 96* 96*  CO2 '25 25 26  '$ GLUCOSE 124* 194* 93  BUN 54* 50* 41*  CREATININE 6.89* 6.69* 5.61*  CALCIUM 8.8* 8.9 9.1  PHOS 3.9 4.4 4.5   Liver Function Tests: Recent Labs  Lab 06/18/20 0020 06/19/20 1541 06/23/20 0559  ALBUMIN 2.1* 2.2* 2.3*   No results for input(s): LIPASE, AMYLASE in the last 168 hours. CBC: Recent Labs  Lab 06/18/20 0020 06/19/20 1541 06/23/20 0559  WBC 9.4 10.8* 9.8  NEUTROABS 6.5  --  5.8  HGB 9.5* 9.9* 10.2*  HCT 32.2* 33.5* 34.3*  MCV 88.7 87.5 87.1  PLT 252 269 251   Blood Culture    Component Value Date/Time   SDES BLOOD SITE NOT SPECIFIED 04/29/2020 1210   SPECREQUEST  04/29/2020 1210    BOTTLES DRAWN AEROBIC AND ANAEROBIC Blood Culture adequate volume   CULT  04/29/2020 1210    NO GROWTH 5 DAYS Performed at [facility], [place] [address], [place], [place] [address]    REPTSTATUS 05/04/2020 FINAL 04/29/2020 1210    Cardiac Enzymes: No results for input(s): CKTOTAL, CKMB, CKMBINDEX, TROPONINI in the last 168  hours. CBG: Recent Labs  Lab 06/23/20 1151 06/23/20 1644 06/23/20 2051 06/24/20 0540 06/24/20 1112  GLUCAP 111* 115* 145* 101* 190*   Iron Studies: No results for input(s): IRON, TIBC, TRANSFERRIN, FERRITIN in the last 72 hours. '@lablastinr3'$ @ Studies/Results: No results found. Medications:  . (feeding supplement) PROSource Plus  30 mL Oral TID BM  . amLODipine  7.5 mg Oral QHS  . Chlorhexidine Gluconate Cloth  6 each Topical Q0600  . Chlorhexidine Gluconate Cloth  6 each Topical Q0600  . darbepoetin (ARANESP) injection - DIALYSIS  200 mcg Intravenous Q Tue-HD  . famotidine  20 mg Oral QHS  . feeding supplement (NEPRO CARB STEADY)  237 mL Oral BID BM  . heparin injection (subcutaneous)  7,500 Units Subcutaneous Q8H  . insulin aspart  0-9 Units Subcutaneous TID WC  . insulin glargine  11 Units Subcutaneous QHS  . lanthanum  500 mg Oral TID WC  . metoCLOPramide  5 mg Oral TID AC  . multivitamin  1 tablet Oral QHS  . pneumococcal 23 valent vaccine  0.5 mL Intramuscular Tomorrow-1000  . polyethylene glycol  17 g Oral Daily    Dialysis Orders: NWTTS 4h 400/800 143.5kg 3K/2.5Ca TDCHeparin3000units IV TIW - Mircera188mgIVq2wks - last 12/8 - Venofer '100mg'$ IVqHD x10 - completed 6/10 doses  Assessment/Plan: 1. Sacral decub/coccygealosteo:S/p 6 weeks ofVanc/Cefepime (finished1/20/22) - ID following. In  CIR. 2. 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 ESRD:Back on usual TTS schedule,nextHDThursday. Does tolerate HD well without dizziness. 4. Diarrhea -Recurrent issue, thought to be d/t Renvelawhich has now been stoppedbut still having issues with diarrhea on Fosrenol. Senna also stopped. Primary team suspects poor sphincter tone 5. BP/volume: Overall BP control is improved.Dizziness is not consistently associated with lower blood pressures but he did have + orthostatic hypotension today. Will let volume come up a  bit, no UF with HD today. If this is not effective in reducing dizziness, may need to decrease amlodipine dosage further.  6. COVID PNA- tested positive on 11/30, is off isolation now.  7. Anemiaof CKD: Hgb 10.2,cont Aranesp 254mgqSat.  8. Secondary Hyperparathyroidism:CorrCaelevated,use low Ca bath.Not on VDRA.Stoppedrenvela 2/2diarrhea, tryingFosrenol insteadPO4 at goal but still with diarrhea, not convinced binders are the issue since he has also had periods of constipation while on fosrenol.PO4 at goal. 9. Nutrition:Alb remains low, continue Nepro. 10. DMT2 - per admit   SAnice Paganini PA-C 06/24/2020, 12:44 PM  [facility] Pager: [phone] I have seen and examined this patient and agree with plan and assessment in the above note with renal recommendations/intervention highlighted. Will keep even today with HD.  JGovernor RooksColadonato,MD 06/24/2020 12:53 PM

## 2020-06-24 NOTE — Progress Notes (Signed)
Called to room from therapy, she reported low blood pressure at the time after transferring patient back to bed. Informed therapist of keeping abdominal binder on during physical therapy as stated in order to help keep blood pressure elevated. Abdominal binder was not on at the time of blood pressure being taken after transfer. Dr. Dagoberto Ligas notified. No further complications noted at this time. Will re-assess.  Audie Clear, LPN

## 2020-06-24 NOTE — Progress Notes (Signed)
Physical Therapy Session Note  [name] MRN: [id number] Date of Birth: Jun 14, 1970  Today's Date: 06/24/2020 PT Individual Time: 1045-1150 PT Individual Time Calculation (min): 65 min   Short Term Goals: Week 3:  PT Short Term Goal 1 (Week 3): Pt will transfer bed<>chair with LRAD and min A PT Short Term Goal 2 (Week 3): Pt will transfer sit<>stand with LRAD and mod A from WC PT Short Term Goal 3 (Week 3): pt will perform all bed mobility with CGA  Skilled Therapeutic Interventions/Progress Updates:     Patient in bed upon PT arrival. Patient alert and agreeable to PT session. Patient reported 5/10 headache during session, RN made aware. PT provided repositioning, rest breaks, and distraction as pain interventions throughout session.   B lower extremity ACE wraps in place prior to session. Abdominal binder donned in sitting with total A.  Patient performed mobility for orthostatic vitals x2 as below. Performed lying exercises between trials.  Orthostatic Vitals: Trial 1: Supine: BP130/94, HR 99 (asymptomatic) Sitting: BP 101/77, HR 106 (mild symptoms "head tingling") Sitting after seated exercises: BP 107/83, HR 106 (symptoms improved, but not resolved) Standing: BP 85/70, HR 121 (8/10 dizziness) Supine after standing: BP 131/92, HR 101  Trial 2: Supine: BP136/93, HR 94 (asymptomatic) Sitting: BP 111/77, HR 90 (mild symptoms "head tingling") Sitting after seated exercises: BP 107/88, HR 101 (symptoms improved, but not resolved) Standing: BP 107/71/70, HR 109 (9/10 dizziness) Sitting x3 min: BP 114/92, HR 105 (5/10 dizziness) Standing: BP 105/73, HR 111 (9/10 dizziness)  Therapeutic Activity: Bed Mobility: Patient performed supine to/from sit x2 with supervision for safety and increased time with use of bed rail and HOB slightly elevated. Provided verbal cues for reducing use of bed features as tolerated to simulate home set up. Transfers: Patient  performed sit to/from stand x3 with mod A in the [place]. Provided verbal cues for scooting forward, foot placement, forward weight shift, and hip/knee/trunk extension in standing. Patient stood x45 sec, 25 sec, and 20 sec with min A for standing balance in the [place].  Therapeutic Exercise: Patient performed the following exercises with verbal and tactile cues for proper technique. Patient  Seated Exercises: -seated marching 2x20 -B LAQ 2x10 -hip abduction/adduction 2x20 with manual resistance on second trial -chest press with 3lb bar 2x10 -bicep curl with 3lb bar 2x10 -overhead press with 3lb bar 2x10 Supine Exercises: -bridging 2x10 -B SLR 2x5  Patient in chair position in the bed at end of session with breaks locked, bed alarm set, and all needs within reach. Made up 20 min from yesterday during session.    Therapy Documentation Precautions:  Precautions Precautions: Fall Precaution Comments: wounds on bilat feet and L sacrum, monitor orthostatic BPs Required Braces or Orthoses: Other Brace Other Brace: abdominal binder Restrictions Weight Bearing Restrictions: No  Therapy/Group: Individual Therapy  Terryon Pineiro L Jelani Vreeland PT, DPT  06/24/2020, 12:31 PM

## 2020-06-24 NOTE — Progress Notes (Signed)
Patient ID: Ivann Guadagnoli, male   DOB: 31-Dec-1970, 50 y.o.   MRN: [id number]  Pt has existing NCPASRR: [id number]. SW sent out SNF referrals in the event pt and family decide this is best option. Pt family still reviewing SNF locations that were provided already. Family would like to keep patient in [place]  SW left  SNF list ([email or web address] in room since pt gone for dialysis already.   Loralee Pacas, MSW, [place] Office: [phone] Cell: [phone] Fax: [phone]

## 2020-06-24 NOTE — Progress Notes (Signed)
Physical Therapy Session Note  [name] MRN: [id number] Date of Birth: 1970-12-26  Today's Date: 06/24/2020 PT Individual Time: 6301-6010 PT Individual Time Calculation (min): 60 min   Short Term Goals: Week 1:  PT Short Term Goal 1 (Week 1): pt will perform all bed mobility with CGA PT Short Term Goal 1 - Progress (Week 1): Not met PT Short Term Goal 2 (Week 1): Pt will transfer sit<>stand with LRAD and mod A PT Short Term Goal 2 - Progress (Week 1): Not met PT Short Term Goal 3 (Week 1): Pt will transfer bed<>chair with LRAD and min A PT Short Term Goal 3 - Progress (Week 1): Not met Week 2:  PT Short Term Goal 1 (Week 2): Pt will transfer bed<>chair with LRAD and min A PT Short Term Goal 1 - Progress (Week 2): Progressing toward goal PT Short Term Goal 2 (Week 2): Pt will transfer sit<>stand with LRAD and mod A PT Short Term Goal 2 - Progress (Week 2): Met PT Short Term Goal 3 (Week 2): pt will perform all bed mobility with CGA PT Short Term Goal 3 - Progress (Week 2): Progressing toward goal PT Short Term Goal 4 (Week 2): pt will recall pressure relief schedule and safe techniques with pressure relief PT Short Term Goal 4 - Progress (Week 2): Met PT Short Term Goal 5 (Week 2): pt will tolerate sitting OOB for 45 mins without adverse effects PT Short Term Goal 5 - Progress (Week 2): Met Week 3:  PT Short Term Goal 1 (Week 3): Pt will transfer bed<>chair with LRAD and min A PT Short Term Goal 2 (Week 3): Pt will transfer sit<>stand with LRAD and mod A from WC PT Short Term Goal 3 (Week 3): pt will perform all bed mobility with CGA  Skilled Therapeutic Interventions/Progress Updates:    pt received in bed and agreeable to therapy. Reported 5/10 headache, reported [name] "fell" during previous OT session and denied to attempt to get to [facility] during session. [name] taken in supine to be 139/93 and pt directed in supine>sit CGA BP taken to be 121/83 with 3 mins  sitting EOB SBA. Pt then reported slight dizziness but relieved quickly. Pt then directed in seated BLE strengthening exercises at his request to attempt sitting activity first, 2x15 marching, LAQ, hip abd/adduction and BUE 2x10 3# bicep curls, chest press, overhead press. [name] taken post activity in sitting to be 119/84. Pt agreeable to attempt standing with Stedy, BLE ace wraps and abdominal binder already in place. Pt directed in x4 Sit to stand with Stedy min A with height of bed slightly elevated, pt able to tolerate standing 30s, 45s, 20, 20s. Pt greatly limited 2/2 increased dizziness and fatigue.  Pt reported increased dizziness with returning to sitting, BP taken to be 121/72. Pt returned to supine SBA, encouraged to attempt chair position in bed, pt agreeable. Pt left in chair position, nursing aware. Pt educated to lower self and call nursing if dizziness or symptoms worsen. Pt agreed. All needs in reach and in good condition. Call light in hand.    Therapy Documentation Precautions:  Precautions Precautions: Fall Precaution Comments: wounds on bilat feet and L sacrum, monitor orthostatic BPs Required Braces or Orthoses: Other Brace Other Brace: abdominal binder Restrictions Weight Bearing Restrictions: No General:   Vital Signs: Therapy Vitals Temp: (!) 97.5 F (36.4 C) Temp Source: Oral Pulse Rate: (!) 103 Resp: 17 BP: 110/72 Patient Position (  if appropriate): Sitting Oxygen Therapy SpO2: 100 % O2 Device: Room Air Pain: Pain Assessment Pain Scale: 0-10 Pain Score: 0-No pain Faces Pain Scale: No hurt Mobility:   Locomotion :    Trunk/Postural Assessment :    Balance:   Exercises:   Other Treatments:      Therapy/Group: Individual Therapy  [name] 06/24/2020, 11:58 AM

## 2020-06-24 NOTE — Progress Notes (Signed)
[facility] PHYSICAL MEDICINE & REHABILITATION PROGRESS NOTE   Subjective/Complaints:   Slept rough- couldn't go to sleep or stay asleep.  Bowels better.   ROS:  Pt denies SOB, abd pain, CP, N/V/C/D, and vision changes   Objective:   No results found. Recent Labs    06/23/20 0559  WBC 9.8  HGB 10.2*  HCT 34.3*  PLT 251   Recent Labs    06/23/20 0559  NA 132*  K 4.7  CL 96*  CO2 26  GLUCOSE 93  BUN 41*  CREATININE 5.61*  CALCIUM 9.1    Intake/Output Summary (Last 24 hours) at 06/24/2020 0837 Last data filed at 06/24/2020 0523 Gross per 24 hour  Intake 480 ml  Output 225 ml  Net 255 ml     Pressure Injury 05/01/20 Buttocks Left Stage 4 - Full thickness tissue loss with exposed bone, tendon or muscle. packed with santyl gauze, dressed by WOCN, (Active)  05/01/20 0230  Location: Buttocks  Location Orientation: Left  Staging: Stage 4 - Full thickness tissue loss with exposed bone, tendon or muscle.  Wound Description (Comments): packed with santyl gauze, dressed by WOCN,  Present on Admission: Yes     Pressure Injury 05/01/20 Ankle Left;Lateral Unstageable - Full thickness tissue loss in which the base of the injury is covered by slough (yellow, tan, gray, green or brown) and/or eschar (tan, brown or black) in the wound bed. smal circular wound on the (Active)  05/01/20 0230  Location: Ankle  Location Orientation: Left;Lateral  Staging: Unstageable - Full thickness tissue loss in which the base of the injury is covered by slough (yellow, tan, gray, green or brown) and/or eschar (tan, brown or black) in the wound bed.  Wound Description (Comments): smal circular wound on the bone  Present on Admission: Yes     Pressure Injury 05/01/20 Foot Left;Posterior Unstageable - Full thickness tissue loss in which the base of the injury is covered by slough (yellow, tan, gray, green or brown) and/or eschar (tan, brown or black) in the wound bed. 4th and 5th toes appear n  (Active)  05/01/20 0230  Location: Foot (4th and 5th toes and lateral/anterior side of the sole)  Location Orientation: Left;Posterior  Staging: Unstageable - Full thickness tissue loss in which the base of the injury is covered by slough (yellow, tan, gray, green or brown) and/or eschar (tan, brown or black) in the wound bed.  Wound Description (Comments): 4th and 5th toes appear necrotic circular wound on sole  Present on Admission: Yes     Pressure Injury 05/01/20 Foot Right;Medial;Posterior Unstageable - Full thickness tissue loss in which the base of the injury is covered by slough (yellow, tan, gray, green or brown) and/or eschar (tan, brown or black) in the wound bed. large circular wo (Active)  05/01/20 0230  Location: Foot  Location Orientation: Right;Medial;Posterior  Staging: Unstageable - Full thickness tissue loss in which the base of the injury is covered by slough (yellow, tan, gray, green or brown) and/or eschar (tan, brown or black) in the wound bed.  Wound Description (Comments): large circular wound on sole  and heel  Present on Admission: Yes     Pressure Injury 05/08/20 Heel Right Unstageable - Full thickness tissue loss in which the base of the injury is covered by slough (yellow, tan, gray, green or brown) and/or eschar (tan, brown or black) in the wound bed. (Active)  05/08/20 1718  Location: Heel  Location Orientation: Right  Staging: Unstageable -  Full thickness tissue loss in which the base of the injury is covered by slough (yellow, tan, gray, green or brown) and/or eschar (tan, brown or black) in the wound bed.  Wound Description (Comments):   Present on Admission: Yes    Physical Exam: Vital Signs Blood pressure 119/83, pulse 92, temperature 98.7 F (37.1 C), resp. rate 18, height 6' 6"  (1.981 m), weight (!) 153.8 kg, SpO2 99 %.   Physical Exam   General: asleep; woke to stimuli, appropriate, NAD Mood and affect are still flat Heart: RRR - no  JVD Lungs: CTA B/L- no W/R/R- good air movement Abdomen: Soft, NT, ND, (+)BS   Extremities: No clubbing, cyanosis, or edema Skin: has stable pressure ulcers on feet and sacrum Neurological: Ox3    Comments: Patient is alert in no acute distress. 5/5 strength throughout.     Assessment/Plan: 1. Functional deficits which require 3+ hours per day of interdisciplinary therapy in a comprehensive inpatient rehab setting.  Physiatrist is providing close team supervision and 24 hour management of active medical problems listed below.  Physiatrist and rehab team continue to assess barriers to discharge/monitor patient progress toward functional and medical goals  Care Tool:  Bathing    Body parts bathed by patient: Right arm,Left arm,Chest,Abdomen,Right upper leg,Left upper leg,Face   Body parts bathed by helper: Right lower leg,Left lower leg,Buttocks     Bathing assist Assist Level: Minimal Assistance - Patient > 75% (declined LB today, EOB level)     Upper Body Dressing/Undressing Upper body dressing   What is the patient wearing?: Pull over shirt    Upper body assist Assist Level: Set up assist    Lower Body Dressing/Undressing Lower body dressing      What is the patient wearing?: Pants,Incontinence brief     Lower body assist Assist for lower body dressing: Minimal Assistance - Patient > 75%     Toileting Toileting    Toileting assist Assist for toileting: Maximal Assistance - Patient 25 - 49% Assistive Device Comment:  (urinal)   Transfers Chair/bed transfer  Transfers assist  Chair/bed transfer activity did not occur: Safety/medical concerns (Per nursing report)  Chair/bed transfer assist level: Moderate Assistance - Patient 50 - 74% Chair/bed transfer assistive device: Sliding board   Locomotion Ambulation   Ambulation assist   Ambulation activity did not occur: Safety/medical concerns (fatigue, lightheadedness, generalized weakness)           Walk 10 feet activity   Assist  Walk 10 feet activity did not occur: Safety/medical concerns (fatigue, lightheadedness, generalized weakness)        Walk 50 feet activity   Assist Walk 50 feet with 2 turns activity did not occur: Safety/medical concerns (fatigue, lightheadedness, generalized weakness)         Walk 150 feet activity   Assist Walk 150 feet activity did not occur: Safety/medical concerns (fatigue, lightheadedness, generalized weakness)         Walk 10 feet on uneven surface  activity   Assist Walk 10 feet on uneven surfaces activity did not occur: Safety/medical concerns (fatigue, lightheadedness, generalized weakness)         Wheelchair     Assist Will patient use wheelchair at discharge?: Yes Type of Wheelchair: Manual    Wheelchair assist level: Supervision/Verbal cueing Max wheelchair distance: 100    Wheelchair 50 feet with 2 turns activity    Assist        Assist Level: Supervision/Verbal cueing   Wheelchair 150 feet activity  Assist      Assist Level: Minimal Assistance - Patient > 75%   Blood pressure 119/83, pulse 92, temperature 98.7 F (37.1 C), resp. rate 18, height 6' 6"  (1.981 m), weight (!) 153.8 kg, SpO2 99 %.  Medical Problem List and Plan: 1.  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 2.  Antithrombotics: -DVT/anticoagulation: Subcutaneous heparin.  Check vascular study             -antiplatelet therapy: N/A 3. Pain Management: Tylenol as needed  1/31- pain controlled- con't regimen 4. Mood: Provide emotional support             -antipsychotic agents: N/A 5. Neuropsych: This patient is? capable of making decisions on his own behalf.  2/1- disinclined to talk with examiner- but appears more Ox3 6. Skin/Wound Care: Routine skin checks 7. Fluids/Electrolytes/Nutrition: Routine in and outs with  follow-up chemistries 8.  End-stage renal disease.  Hemodialysis per renal services 9.  Sacral decubitus ulcer as well as ischemic ulcers on the left foot fourth toe and fifth metatarsal head and right heel and fifth metatarsal head.  Wound care nurse follow-up.  Patient to see Dr. Sharol Given outpatient.  Continue bilateral PRAFO's. 10.  ID/ coccygeal osteomyelitis.  Continue vancomycin and cefepime through 06/10/2020  1/17- WBC 8.1  1/18- will schedule turning q2 hours to get off wounds- also will make sure on specialty bed  1/20- is on specialty bed  1/27- off IV ABX for osteomyletis- WBC slightly elevated at 11.9- will check CRP and ESR for baseline?  1/28- WBC down to 9.5- CRP pending, but ESR is 30- which is great considering wounds  2/1- WBC up to 10.8- will recheck CRP/ESR and go from there.   2/2- WBC down to 9.8- ESR up somewhat to 73 and CRP down to 6.9- will con't to monitor, since getting some values up; some down-  10.  Anemia of chronic disease.  Continue Aranesp. Hgb 8.8 on 1/16, repeat with HD  1/17- no CBC today, only BMP- will try and check with HD on Tuesday.   1/19- doing better based on labs yesterday  1/26- Hb 9.8- con't regimen 11.  Diabetes mellitus with peripheral neuropathy .  Hemoglobin A1c 5.6.  Lantus insulin 14 units nightly  1/22- seen by Diabetes coordinator they want me to decrease Lantus to 8 units, however Bgs running 86-120- which is ideal- will wait to decrease esp with all his wounds-  CBG (last 3)  Recent Labs    06/23/20 1644 06/23/20 2051 06/24/20 0540  GLUCAP 115* 145* 101*  2/1- BGs 109-226- labile- more elevated than had been- will give 1 more day, and if still up, will titrate up on Lantus 2/3- BGs well controlled 101-145- con't regimen 12.  Morbid obesity.  BMI 38.37.  Dietary follow-up  13. Dizziness/nausea with HTN  1/17- Sx's so significant, vomited this AM when got up- will try abd binder and ACE wraps for LEs- since due to osteomyelitis, don't  think can use TEDs- if need be, can add Midodrine, but due to ESRD, cannot start Florinef.  1/18- BP laying is 164/101 this AM- might not do well with midodrine?  1/19- since not clear if actually orthostatic, per renal, will order daily orthostatics x7 days.   1/20- BP 177/108 this AM- used to be on Metoprolol and higher dose of  Norvasc- will  give 1x time Metoprolol 12.5 mg since BP so high AND pulse is in 80s  1/23- stating to think he's so used to really high BP, so when comes down to more normal level, he's hypotensive.   1/25- BP slightly elevated- when c/o dizziness, BP not lower- but Sx's are improving- con't to monitor  1/27- will order hydralazine 25 mg PO q6 hours prn for SBP >180 or DBP >110- really think dizziness is "functional orthostasis" since his body is used to such high Bps     Vitals:   06/23/20 1949 06/24/20 0526  BP: 130/82 119/83  Pulse: 100 92  Resp: 20 18  Temp: 98.2 F (36.8 C) 98.7 F (37.1 C)  SpO2: 99% 99%   2/1- BP 153./92 this AM- and 140s/80 yesterday evening- doing better- will con't regimen for now.  2/2- added Hydralazine prn back- also renal increased Norvasc to 10 mg daily- con't regimen 2/3- BP much better controlled 119-130/80s- con't regimen 14. Diarrhea with new constipation  1/18- LBM 3 days ago- on imodium 2 mg q4 hours around the clock- will change to q8 hours to see if can get him to have BM.  1/19- wil give Sorbitol 60 ml today since no BM- maintain lower dose of imodium per pt request.  1/20- thinks sorbitol made him sick- it's actually the ileus that made him sick- refusing NG tube- will give Suppository per pt request (KUB shows likely ileus) and changed diet to clear liquid diet for now. .   Pt states he was on 3 stool softeners on acute care which worked for him  Will start senna S 2 po BID   1/25- restarted miralax daily- pt scared of loose stools, but said will "try it"".  1/26- had 2 large Bms and 1 tiny BM- finally working- con't  regimen-don't stop miralax/sorbitol right now.  1.27- 2-3 more BMs yesterday- con't regimen- looking better   1/31- had 2 BMs that were accidents- will back off Senokot to 1 tab BID and con't Miralax  2/1- still having BMs- frequent, but pt willing to wait a little to see if titration yesterday fixed things.   2/2- having more incontinence than knew- sounds like incompetent sphincter??? Discussed bowel program- wants to think about it- will d/c senokot  2/3- less incontinence- con't regimen 15. Leukocytosis  1/26- will check CXR since WBC up to 11.9- Tm 99.1- not elevated.   1/27- CXR showed minimal atalectasis- will monitor- will also recheck labs in AM with ESR/CRP due to wounds.   sedrate mild elevation but unchanged vs 1 mo ago  2/1- WBC 10.8- will recheck labs in AM with CRP and ESR  2/2- WBC 9.8- better-      LOS: 18 days A FACE TO FACE EVALUATION WAS PERFORMED  Fred [name] 06/24/2020, 8:37 AM

## 2020-06-24 NOTE — Progress Notes (Signed)
Occupational Therapy Session Note  [name] MRN: [id number] Date of Birth: January 07, 1971  Today's Date: 06/24/2020 OT Individual Time: [id number] OT Individual Time Calculation (min): 75 min    Short Term Goals: Week 2:  OT Short Term Goal 1 (Week 2): Pt will tolerate sitting OOB for 2-3 hours OT Short Term Goal 2 (Week 2): Pt will complete LB bathing and dressing with Min A w/ AE PRN OT Short Term Goal 3 (Week 2): Pt will complete transfer to [facility] with LRAD with Min A OT Short Term Goal 4 (Week 2): Pt will perform sit to stand at [facility] with Max A of 1 consistently  Skilled Therapeutic Interventions/Progress Updates:    Patient in bed, alert, denies pain.   Supine to sitting edge of bed with CS.  Utilized stedy for transfer bed to w/c - CG/min A to stand from elevated bed surface.  Grooming tasks seated at sink mod I.  Able to propel w/c to therapy gym with CS at slow rate.  Completed UB ergometer x4 minutes at slow rate, second attempt for 3 minutes - returned to room due to fatigue and feeling "light headed".  SB transfer to bed with min A.  Sit to supine min A.  BP upon lying down 119/90.   Attempted sitting edge of bed and became dizzy within 2 minutes of light exercise returned to lying position and BP 110/87.  Again attempted to sit and took BP right away - 86/59.  Returned to supine - nursing and Dr Dagoberto Ligas aware.  He remained in bed at close of session - states that he feels okay but frustrated.  Call bell in reach.     Therapy Documentation Precautions:  Precautions Precautions: Fall Precaution Comments: wounds on bilat feet and L sacrum, monitor orthostatic BPs Required Braces or Orthoses: Other Brace Other Brace: abdominal binder Restrictions Weight Bearing Restrictions: No   Therapy/Group: Individual Therapy  [name] 06/24/2020, 7:31 AM

## 2020-06-24 NOTE — Progress Notes (Signed)
No s/s of distress noted at this time, patient sitting on side of bed with abdominal binder on with therapy. BP 110/72. No complications noted at this time. Audie Clear, LPN

## 2020-06-25 LAB — GLUCOSE, CAPILLARY
Glucose-Capillary: 93 mg/dL (ref 70–99)
Glucose-Capillary: 155 mg/dL — ABNORMAL HIGH (ref 70–99)
Glucose-Capillary: 175 mg/dL — ABNORMAL HIGH (ref 70–99)
Glucose-Capillary: 184 mg/dL — ABNORMAL HIGH (ref 70–99)

## 2020-06-25 NOTE — Progress Notes (Signed)
Patient ID: Fred [name], male   DOB: 12-01-1970, 50 y.o.   MRN: [id number] S: No new complaints O:BP (!) 113/93 (BP Location: Left Arm)   Pulse 98   Temp 98.3 F (36.8 C)   Resp 18   Ht '6\' 6"'$  (1.981 m)   Wt (!) 153.8 kg   SpO2 100%   BMI 39.18 kg/m   Intake/Output Summary (Last 24 hours) at 06/25/2020 1035 Last data filed at 06/24/2020 1815 Gross per 24 hour  Intake 240 ml  Output 0 ml  Net 240 ml   Intake/Output: I/O last 3 completed shifts: In: 290 [P.O.:290] Out: 225 [Urine:225]  Intake/Output this shift:  No intake/output data recorded. Weight change:  Gen: NAD CVS: RRR Resp: cta Abd: +BS, soft, NT/ND Ext: no edema  Recent Labs  Lab 06/19/20 1541 06/23/20 0559 06/24/20 1314  NA 132* 132* 131*  K 4.6 4.7 5.3*  CL 96* 96* 93*  CO2 '25 26 25  '$ GLUCOSE 194* 93 181*  BUN 50* 41* 59*  CREATININE 6.69* 5.61* 7.62*  ALBUMIN 2.2* 2.3* 2.3*  CALCIUM 8.9 9.1 9.5  PHOS 4.4 4.5 5.0*   Liver Function Tests: Recent Labs  Lab 06/19/20 1541 06/23/20 0559 06/24/20 1314  ALBUMIN 2.2* 2.3* 2.3*   No results for input(s): LIPASE, AMYLASE in the last 168 hours. No results for input(s): AMMONIA in the last 168 hours. CBC: Recent Labs  Lab 06/19/20 1541 06/23/20 0559 06/24/20 1314  WBC 10.8* 9.8 9.9  NEUTROABS  --  5.8  --   HGB 9.9* 10.2* 10.0*  HCT 33.5* 34.3* 32.3*  MCV 87.5 87.1 85.0  PLT 269 251 255   Cardiac Enzymes: No results for input(s): CKTOTAL, CKMB, CKMBINDEX, TROPONINI in the last 168 hours. CBG: Recent Labs  Lab 06/24/20 0540 06/24/20 1112 06/24/20 1656 06/24/20 2031 06/25/20 0614  GLUCAP 101* 190* 93 163* 93    Iron Studies: No results for input(s): IRON, TIBC, TRANSFERRIN, FERRITIN in the last 72 hours. Studies/Results: No results found. . (feeding supplement) PROSource Plus  30 mL Oral TID BM  . amLODipine  7.5 mg Oral QHS  . Chlorhexidine Gluconate Cloth  6 each Topical Q0600  . Chlorhexidine Gluconate Cloth  6 each Topical Q0600  .  darbepoetin (ARANESP) injection - DIALYSIS  200 mcg Intravenous Q Tue-HD  . famotidine  20 mg Oral QHS  . feeding supplement (NEPRO CARB STEADY)  237 mL Oral BID BM  . heparin injection (subcutaneous)  7,500 Units Subcutaneous Q8H  . insulin aspart  0-9 Units Subcutaneous TID WC  . insulin glargine  11 Units Subcutaneous QHS  . lanthanum  500 mg Oral TID WC  . metoCLOPramide  5 mg Oral TID AC  . multivitamin  1 tablet Oral QHS  . pneumococcal 23 valent vaccine  0.5 mL Intramuscular Tomorrow-1000  . polyethylene glycol  17 g Oral Daily    BMET    Component Value Date/Time   NA 131 (L) 06/24/2020 1314   K 5.3 (H) 06/24/2020 1314   CL 93 (L) 06/24/2020 1314   CO2 25 06/24/2020 1314   GLUCOSE 181 (H) 06/24/2020 1314   BUN 59 (H) 06/24/2020 1314   CREATININE 7.62 (H) 06/24/2020 1314   CALCIUM 9.5 06/24/2020 1314   GFRNONAA 8 (L) 06/24/2020 1314   CBC    Component Value Date/Time   WBC 9.9 06/24/2020 1314   RBC 3.80 (L) 06/24/2020 1314   HGB 10.0 (L) 06/24/2020 1314   HCT 32.3 (L) 06/24/2020 1314  PLT 255 06/24/2020 1314   MCV 85.0 06/24/2020 1314   MCH 26.3 06/24/2020 1314   MCHC 31.0 06/24/2020 1314   RDW 16.5 (H) 06/24/2020 1314   LYMPHSABS 2.3 06/23/2020 0559   MONOABS 1.4 (H) 06/23/2020 0559   EOSABS 0.3 06/23/2020 0559   BASOSABS 0.1 06/23/2020 0559      Dialysis Orders: NWTTS 4h 400/800 143.5kg 3K/2.5Ca TDCHeparin3000units IV TIW - Mircera171mgIVq2wks - last 12/8 - Venofer '100mg'$ IVqHD x10 - completed 6/10 doses  Assessment/Plan: 1. Sacral decub/coccygealosteo:S/p 6 weeks ofVanc/Cefepime (finished1/20/22) - ID following. In CIR. 2. 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 ESRD:Back on usual TTS schedule.Does tolerate HD well without dizziness. 4. Diarrhea -Recurrent issue, thought to be d/t Renvelawhich has now been stoppedbut still having issues with diarrhea on Fosrenol.Senna also stopped.  Primary team suspects poor sphincter tone 5. BP/volume:Overall BP control is improved.Dizziness is not consistently associated with lower blood pressures but he did have + orthostatic hypotension today. Will let volume come up a bit, no UF with HD today. If this is not effective in reducing dizziness, may need to decrease amlodipine dosage further.  6. COVID PNA- tested positive on 11/30, is off isolation now.  7. Anemiaof CKD: Hgb10.2,cont Aranesp 2036mqSat.  8. Secondary Hyperparathyroidism:CorrCaelevated,use low Ca bath.Not on VDRA.Stoppedrenvela 2/2diarrhea, tryingFosrenol insteadPO4 at goal but still with diarrhea, not convinced binders are the issue since he has also had periods of constipation while on fosrenol.PO4 at goal. 9. Nutrition:Alb remains low, continue Nepro. 10. DMT2 - per admit   JoDonetta PottsMD CaPenn Highlands [phone]

## 2020-06-25 NOTE — Plan of Care (Signed)
  Problem: Consults Goal: RH GENERAL PATIENT EDUCATION Description: See Patient Education module for education specifics. 06/25/2020 0231 by Tommie Sams, RN Outcome: Progressing 06/25/2020 0229 by Tommie Sams, RN Outcome: Progressing Goal: Skin Care Protocol Initiated - if Braden Score 18 or less Description: If consults are not indicated, leave blank or document N/A 06/25/2020 0231 by Tommie Sams, RN Outcome: Progressing 06/25/2020 0229 by Tommie Sams, RN Outcome: Progressing   Problem: RH BOWEL ELIMINATION Goal: RH STG MANAGE BOWEL WITH ASSISTANCE Description: STG Manage Bowel with min Assistance. 06/25/2020 0231 by Tommie Sams, RN Outcome: Progressing 06/25/2020 0229 by Tommie Sams, RN Outcome: Progressing Goal: RH STG MANAGE BOWEL W/MEDICATION W/ASSISTANCE Description: STG Manage Bowel with Medication with min Assistance. 06/25/2020 0231 by Tommie Sams, RN Outcome: Progressing 06/25/2020 0229 by Tommie Sams, RN Outcome: Progressing   Problem: RH SKIN INTEGRITY Goal: RH STG SKIN FREE OF INFECTION/BREAKDOWN Description: Skin to remain free from infection and breakdown with min assist. 06/25/2020 0231 by Tommie Sams, RN Outcome: Progressing 06/25/2020 0229 by Tommie Sams, RN Outcome: Progressing Goal: RH STG MAINTAIN SKIN INTEGRITY WITH ASSISTANCE Description: STG Maintain Skin Integrity With min Assistance. 06/25/2020 0231 by Tommie Sams, RN Outcome: Progressing 06/25/2020 0229 by Tommie Sams, RN Outcome: Progressing Goal: RH STG ABLE TO PERFORM INCISION/WOUND CARE W/ASSISTANCE Description: STG Able To Perform Incision/Wound Care With min Assistance. 06/25/2020 0231 by Tommie Sams, RN Outcome: Progressing 06/25/2020 0229 by Tommie Sams, RN Outcome: Progressing   Problem: RH SAFETY Goal: RH STG ADHERE TO SAFETY PRECAUTIONS W/ASSISTANCE/DEVICE Description: STG Adhere to Safety Precautions With min Assistance/Device. 06/25/2020  0231 by Tommie Sams, RN Outcome: Progressing 06/25/2020 0229 by Tommie Sams, RN Outcome: Progressing   Problem: RH PAIN MANAGEMENT Goal: RH STG PAIN MANAGED AT OR BELOW PT'S PAIN GOAL Description: <4 on a 0-10 pain scale. 06/25/2020 0231 by Tommie Sams, RN Outcome: Progressing 06/25/2020 0229 by Tommie Sams, RN Outcome: Progressing   Problem: RH KNOWLEDGE DEFICIT GENERAL Goal: RH STG INCREASE KNOWLEDGE OF SELF CARE AFTER HOSPITALIZATION Description: Patient will be able to demonstrate knowledge of medication management, dietary management, wound care management, with educational materials and handouts with cues and reminders. 06/25/2020 0231 by Tommie Sams, RN Outcome: Progressing 06/25/2020 0229 by Tommie Sams, RN Outcome: Progressing

## 2020-06-25 NOTE — Progress Notes (Signed)
Occupational Therapy Session Note  [name] MRN: [id number] Date of Birth: [birth year]  Today's Date: 06/25/2020 OT Individual Time: [id number] OT Individual Time Calculation (min): 29 min    Short Term Goals: Week 3:  OT Short Term Goal 1 (Week 3): STGs=LTGs due to ELOS  Skilled Therapeutic Interventions/Progress Updates:    Pt greeted in bed with no c/o pain, wanting to stand during session. Supine BP: 126/95. B LE ACE wraps and abdominal binder donned. Once he transitioned EOB, pt reported mild dizziness. Sit<stand from elevated bed completed with Max A of 1, pt standing for 1 minute 17 seconds with cues for forward gaze. Once pt sat back down, BP 140/116. Binder was removed and BP reassessed after 2 minutes with reading of 109/73. Pt wanted to stand a 2nd time, Max A for sit<stand and pt able to stand for 16 seconds. BP after 104/76. Pt requested to stand one last time and able to stand for ~40 seconds. ACE wraps were doffed once he returned to bed. BP before OT departure 131/80. Pt reports tingling in his head when upright that worsens during BP changes, this sensation reportedly absolves when he returns to bed. Pt continues to be frustrated regarding his fluctuating BP and orthostatic symptoms. Left him with all needs within reach.   Therapy Documentation Precautions:  Precautions Precautions: Fall Precaution Comments: wounds on bilat feet and L sacrum, monitor orthostatic BPs Required Braces or Orthoses: Other Brace Other Brace: abdominal binder Restrictions Weight Bearing Restrictions: No Vital Signs: Therapy Vitals Temp: 98.3 F (36.8 C) Pulse Rate: 97 Resp: 17 BP: 133/82 Patient Position (if appropriate): Lying Oxygen Therapy SpO2: 100 % O2 Device: Room Air Pain:   ADL: ADL Eating: Set up,Supervision/safety Where Assessed-Eating: Bed level Grooming: Supervision/safety,Setup Where Assessed-Grooming: Sitting at sink,Wheelchair Upper Body  Bathing: Minimal assistance Where Assessed-Upper Body Bathing: Wheelchair,Sitting at sink Lower Body Bathing: Moderate assistance Where Assessed-Lower Body Bathing: Bed level Upper Body Dressing: Setup,Supervision/safety Where Assessed-Upper Body Dressing: Sitting at sink,Wheelchair Lower Body Dressing: Maximal assistance Where Assessed-Lower Body Dressing: Bed level      Therapy/Group: Individual Therapy  Delene Morais A Abbygale Lapid 06/25/2020, 4:10 PM

## 2020-06-25 NOTE — Progress Notes (Signed)
Occupational Therapy Weekly Progress Note  [name] MRN: [id number] Date of Birth: 1971-04-02  Beginning of progress report period: 06/15/2020 End of progress report period: 06/25/2020  Today's Date: 06/25/2020 OT Individual Time: [id number] OT Individual Time Calculation (min): 54 min   Patient has met 2 of 4 short term goals.    Pt has made slow progress at time of report. He continues to be limited functionally by hypotension when OOB and dizziness during upright functional activity. He continues to be very motivated during tx sessions to gain back his strength and improve upright tolerance. At time of report he completes slideboard transfers with Min A and sit<stand in [place] with Min A. At this time pt is planning to d/c SNF.   Patient continues to demonstrate the following deficits: muscle weakness, decreased cardiorespiratoy endurance and decreased standing balance and therefore will continue to benefit from skilled OT intervention to enhance overall performance with BADL.  Patient progressing toward long term goals..  Continue plan of care.  OT Short Term Goals Week 2:  OT Short Term Goal 1 (Week 2): Pt will tolerate sitting OOB for 2-3 hours OT Short Term Goal 1 - Progress (Week 2): Progressing toward goal OT Short Term Goal 2 (Week 2): Pt will complete LB bathing and dressing with Min A w/ AE PRN OT Short Term Goal 2 - Progress (Week 2): Met OT Short Term Goal 3 (Week 2): Pt will complete transfer to [facility] with LRAD with Min A OT Short Term Goal 3 - Progress (Week 2): Progressing toward goal OT Short Term Goal 4 (Week 2): Pt will perform sit to stand at [facility] with Max A of 1 consistently OT Short Term Goal 4 - Progress (Week 2): Met Week 3:  OT Short Term Goal 1 (Week 3): STGs=LTGs due to ELOS  Skilled Therapeutic Interventions/Progress Updates:    Pt greeted in the bed with no c/o pain, just tingling sensations that radiate from the top of his head down  sides of head/jaw area. Pt requesting for EOB tx today due to just feeling "bad." He declined participation in ADLs but agreeable to engage in simulated LB tasks, pt able to complete LB bathing with Min A for pericare in the back (simulated) and doff/don pants + gripper socks with increased time and supervision assist using reclined figure 4 position as needed. BP while supine: 154/89.   Pt was guided through LE exercises including: flutter kicks 1 minute holds x3 sets, seated marches 10 reps 2 sets, straight leg raises using strap x1 minute holds 2 sets each leg, bilateral hamstring stretches using strap x1 minute holds 2 sets. Transitioned to core strengthening via anterior reaches to target outside of base of support, hands clasped with active scanning to target to challenge tolerance of eye movements. Pt needing rest breaks due to fatigue/dizziness symptoms. Note that BP while sitting wearing ACE wraps 115/83, 109/85, then after once abdominal binder was applied 103/76. Pt reported steady "moderate" dizziness during exercises. He returned to bed at end of tx where compression garments were doffed. Pt remained in bed at close of session, all needs within reach.   Discussed toileting options at this time and due to buttocks wound and his ability to only complete sit<stands from significantly elevated surfaces, we decided that bedpan is still his most appropriate toileting option.   Therapy Documentation Precautions:  Precautions Precautions: Fall Precaution Comments: wounds on bilat feet and L sacrum, monitor orthostatic BPs Required Braces or  Orthoses: Other Brace Other Brace: abdominal binder Restrictions Weight Bearing Restrictions: No Vital Signs: Therapy Vitals Temp: 98.3 F (36.8 C) Pulse Rate: 98 Resp: 18 BP: (!) 113/93 Patient Position (if appropriate): Lying Oxygen Therapy SpO2: 100 % O2 Device: Room Air ADL: ADL Eating: Set up,Supervision/safety Where Assessed-Eating: Bed  level Grooming: Supervision/safety,Setup Where Assessed-Grooming: Sitting at sink,Wheelchair Upper Body Bathing: Minimal assistance Where Assessed-Upper Body Bathing: Wheelchair,Sitting at sink Lower Body Bathing: Moderate assistance Where Assessed-Lower Body Bathing: Bed level Upper Body Dressing: Setup,Supervision/safety Where Assessed-Upper Body Dressing: Sitting at sink,Wheelchair Lower Body Dressing: Maximal assistance Where Assessed-Lower Body Dressing: Bed level      Therapy/Group: Individual Therapy  Arrick Dutton A Natavia Sublette 06/25/2020, 7:08 AM

## 2020-06-25 NOTE — Progress Notes (Signed)
Physical Therapy Session Note  [name] MRN: [id number] Date of Birth: [birth year]  Today's Date: 06/25/2020 PT Individual Time: 0800-0900 PT Individual Time Calculation (min): 60 min   Short Term Goals: Week 1:  PT Short Term Goal 1 (Week 1): pt will perform all bed mobility with CGA PT Short Term Goal 1 - Progress (Week 1): Not met PT Short Term Goal 2 (Week 1): Pt will transfer sit<>stand with LRAD and mod A PT Short Term Goal 2 - Progress (Week 1): Not met PT Short Term Goal 3 (Week 1): Pt will transfer bed<>chair with LRAD and min A PT Short Term Goal 3 - Progress (Week 1): Not met Week 2:  PT Short Term Goal 1 (Week 2): Pt will transfer bed<>chair with LRAD and min A PT Short Term Goal 1 - Progress (Week 2): Progressing toward goal PT Short Term Goal 2 (Week 2): Pt will transfer sit<>stand with LRAD and mod A PT Short Term Goal 2 - Progress (Week 2): Met PT Short Term Goal 3 (Week 2): pt will perform all bed mobility with CGA PT Short Term Goal 3 - Progress (Week 2): Progressing toward goal PT Short Term Goal 4 (Week 2): pt will recall pressure relief schedule and safe techniques with pressure relief PT Short Term Goal 4 - Progress (Week 2): Met PT Short Term Goal 5 (Week 2): pt will tolerate sitting OOB for 45 mins without adverse effects PT Short Term Goal 5 - Progress (Week 2): Met Week 3:  PT Short Term Goal 1 (Week 3): Pt will transfer bed<>chair with LRAD and min A PT Short Term Goal 2 (Week 3): Pt will transfer sit<>stand with LRAD and mod A from WC PT Short Term Goal 3 (Week 3): pt will perform all bed mobility with CGA  Skilled Therapeutic Interventions/Progress Updates:    pt received in bed and agreeable to therapy. Denied pain and dizziness. PT donned BLE ace wraps and abdominal binder max A. Pt directed in supine>sit SBA, sat EOB SBA. BP taken to be 138/88. Pt directed in marching and LAQ for improved BP with good effect, BP taken to be  155/96. Pt agreeable to attempt transfer to [facility], directed in slide board transfer CGA to [place]. Take to gym total A in [facility] for time and energy conservation. Pt directed in x2 Sit to stand in // min A and pt directed in gait training in // 8' min A with VC for trunk extension. Pt demonstrated greatly improved BLE controlled mobility, good posture control however limited with beginning to feel dizzy. Pt sat, BP taken to be 116/74. Pt directed to sit and complete marching, LAQ x10 each, pt denied dizziness or lightheadedness after this however BP taken after 3 mins to be 94/64. Pt returned to room sat in [facility] with BLE elevated and BP increased to 100/68. Pt directed in slide board transfer back to bed, CGA and SBA for sit>supine, SBA for repositioning in bed. [name] taken to be 1220/88. Pt denied all symptoms and instructed to attempt chair position in bed. Pt tolerated well. Left in chair position, All needs in reach and in good condition. Call light in hand.  Pt denied pain and dizziness.   Therapy Documentation Precautions:  Precautions Precautions: Fall Precaution Comments: wounds on bilat feet and L sacrum, monitor orthostatic BPs Required Braces or Orthoses: Other Brace Other Brace: abdominal binder Restrictions Weight Bearing Restrictions: No General:   Vital Signs:  Pain: Pain Assessment  Pain Scale: 0-10 Pain Score: 0-No pain Mobility:   Locomotion :    Trunk/Postural Assessment :    Balance:   Exercises:   Other Treatments:      Therapy/Group: Individual Therapy  [name] 06/25/2020, 11:45 AM

## 2020-06-25 NOTE — Progress Notes (Signed)
[place] PHYSICAL MEDICINE & REHABILITATION PROGRESS NOTE   Subjective/Complaints:  Pt sleeps poorly most every night- refusing additional meds- says they make him confused/wacky.  Had Low BP yesterday- got down to 86/50s- documented in OT note-  Got REALLY dizzy, but no LOC. Was NOT wearing abd binder at that time, per nursing.   Also notes went to bathroom "on his own"- voided- LBM 2 days ago- denies constipation.    ROS:  Pt denies SOB, abd pain, CP, N/V/C/D, and vision changes  Objective:   No results found. Recent Labs    06/23/20 0559 06/24/20 1314  WBC 9.8 9.9  HGB 10.2* 10.0*  HCT 34.3* 32.3*  PLT 251 255   Recent Labs    06/23/20 0559 06/24/20 1314  NA 132* 131*  K 4.7 5.3*  CL 96* 93*  CO2 26 25  GLUCOSE 93 181*  BUN 41* 59*  CREATININE 5.61* 7.62*  CALCIUM 9.1 9.5    Intake/Output Summary (Last 24 hours) at 06/25/2020 0840 Last data filed at 06/24/2020 1815 Gross per 24 hour  Intake 240 ml  Output 0 ml  Net 240 ml     Pressure Injury 05/01/20 Buttocks Left Stage 4 - Full thickness tissue loss with exposed bone, tendon or muscle. packed with santyl gauze, dressed by WOCN, (Active)  05/01/20 0230  Location: Buttocks  Location Orientation: Left  Staging: Stage 4 - Full thickness tissue loss with exposed bone, tendon or muscle.  Wound Description (Comments): packed with santyl gauze, dressed by WOCN,  Present on Admission: Yes     Pressure Injury 05/01/20 Ankle Left;Lateral Unstageable - Full thickness tissue loss in which the base of the injury is covered by slough (yellow, tan, gray, green or brown) and/or eschar (tan, brown or black) in the wound bed. smal circular wound on the (Active)  05/01/20 0230  Location: Ankle  Location Orientation: Left;Lateral  Staging: Unstageable - Full thickness tissue loss in which the base of the injury is covered by slough (yellow, tan, gray, green or brown) and/or eschar (tan, brown or black) in the wound bed.   Wound Description (Comments): smal circular wound on the bone  Present on Admission: Yes     Pressure Injury 05/01/20 Foot Left;Posterior Unstageable - Full thickness tissue loss in which the base of the injury is covered by slough (yellow, tan, gray, green or brown) and/or eschar (tan, brown or black) in the wound bed. 4th and 5th toes appear n (Active)  05/01/20 0230  Location: Foot (4th and 5th toes and lateral/anterior side of the sole)  Location Orientation: Left;Posterior  Staging: Unstageable - Full thickness tissue loss in which the base of the injury is covered by slough (yellow, tan, gray, green or brown) and/or eschar (tan, brown or black) in the wound bed.  Wound Description (Comments): 4th and 5th toes appear necrotic circular wound on sole  Present on Admission: Yes     Pressure Injury 05/01/20 Foot Right;Medial;Posterior Unstageable - Full thickness tissue loss in which the base of the injury is covered by slough (yellow, tan, gray, green or brown) and/or eschar (tan, brown or black) in the wound bed. large circular wo (Active)  05/01/20 0230  Location: Foot  Location Orientation: Right;Medial;Posterior  Staging: Unstageable - Full thickness tissue loss in which the base of the injury is covered by slough (yellow, tan, gray, green or brown) and/or eschar (tan, brown or black) in the wound bed.  Wound Description (Comments): large circular wound on sole  and heel  Present on Admission: Yes     Pressure Injury 05/08/20 Heel Right Unstageable - Full thickness tissue loss in which the base of the injury is covered by slough (yellow, tan, gray, green or brown) and/or eschar (tan, brown or black) in the wound bed. (Active)  05/08/20 1718  Location: Heel  Location Orientation: Right  Staging: Unstageable - Full thickness tissue loss in which the base of the injury is covered by slough (yellow, tan, gray, green or brown) and/or eschar (tan, brown or black) in the wound bed.  Wound  Description (Comments):   Present on Admission: Yes    Physical Exam: Vital Signs Blood pressure (!) 113/93, pulse 98, temperature 98.3 F (36.8 C), resp. rate 18, height 6' 6"  (1.981 m), weight (!) 153.8 kg, SpO2 100 %.   Physical Exam   General: asleep- woke to stimuli, laying flat in bed; NAD Mood and affect are still flat and sleepy Heart: borderline tachycardia- no JVD Lungs: CTA B/L- no W/R/R- good air movement Abdomen: Soft, NT, ND, (+)BS   Extremities: No clubbing, cyanosis, or edema Skin: has stable pressure ulcers on feet and sacrum Neurological: Ox3, but hard to get info due to sedation/sleepiness    Comments: Patient is alert in no acute distress. 5/5 strength throughout.     Assessment/Plan: 1. Functional deficits which require 3+ hours per day of interdisciplinary therapy in a comprehensive inpatient rehab setting.  Physiatrist is providing close team supervision and 24 hour management of active medical problems listed below.  Physiatrist and rehab team continue to assess barriers to discharge/monitor patient progress toward functional and medical goals  Care Tool:  Bathing    Body parts bathed by patient: Right arm,Left arm,Chest,Abdomen,Right upper leg,Left upper leg,Face   Body parts bathed by helper: Right lower leg,Left lower leg,Buttocks     Bathing assist Assist Level: Minimal Assistance - Patient > 75% (declined LB today, EOB level)     Upper Body Dressing/Undressing Upper body dressing   What is the patient wearing?: Pull over shirt    Upper body assist Assist Level: Set up assist    Lower Body Dressing/Undressing Lower body dressing      What is the patient wearing?: Pants,Incontinence brief     Lower body assist Assist for lower body dressing: Minimal Assistance - Patient > 75%     Toileting Toileting    Toileting assist Assist for toileting: Maximal Assistance - Patient 25 - 49% Assistive Device Comment:  (urinal)    Transfers Chair/bed transfer  Transfers assist  Chair/bed transfer activity did not occur: Safety/medical concerns (Per nursing report)  Chair/bed transfer assist level: Moderate Assistance - Patient 50 - 74% Chair/bed transfer assistive device: Sliding board   Locomotion Ambulation   Ambulation assist   Ambulation activity did not occur: Safety/medical concerns (fatigue, lightheadedness, generalized weakness)          Walk 10 feet activity   Assist  Walk 10 feet activity did not occur: Safety/medical concerns (fatigue, lightheadedness, generalized weakness)        Walk 50 feet activity   Assist Walk 50 feet with 2 turns activity did not occur: Safety/medical concerns (fatigue, lightheadedness, generalized weakness)         Walk 150 feet activity   Assist Walk 150 feet activity did not occur: Safety/medical concerns (fatigue, lightheadedness, generalized weakness)         Walk 10 feet on uneven surface  activity   Assist Walk 10 feet on uneven surfaces  activity did not occur: Safety/medical concerns (fatigue, lightheadedness, generalized weakness)         Wheelchair     Assist Will patient use wheelchair at discharge?: Yes Type of Wheelchair: Manual    Wheelchair assist level: Supervision/Verbal cueing Max wheelchair distance: 100    Wheelchair 50 feet with 2 turns activity    Assist        Assist Level: Supervision/Verbal cueing   Wheelchair 150 feet activity     Assist      Assist Level: Minimal Assistance - Patient > 75%   Blood pressure (!) 113/93, pulse 98, temperature 98.3 F (36.8 C), resp. rate 18, height 6' 6"  (1.981 m), weight (!) 153.8 kg, SpO2 100 %.  Medical Problem List and Plan: 1.  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 2.  Antithrombotics: -DVT/anticoagulation: Subcutaneous heparin.  Check vascular  study             -antiplatelet therapy: N/A 3. Pain Management: Tylenol as needed  1/31- pain controlled- con't regimen 4. Mood: Provide emotional support             -antipsychotic agents: N/A 5. Neuropsych: This patient is? capable of making decisions on his own behalf.  2/1- disinclined to talk with examiner- but appears more Ox3 6. Skin/Wound Care: Routine skin checks 7. Fluids/Electrolytes/Nutrition: Routine in and outs with follow-up chemistries 8.  End-stage renal disease.  Hemodialysis per renal services 9.  Sacral decubitus ulcer as well as ischemic ulcers on the left foot fourth toe and fifth metatarsal head and right heel and fifth metatarsal head.  Wound care nurse follow-up.  Patient to see Dr. Sharol Given outpatient.  Continue bilateral PRAFO's. 10.  ID/ coccygeal osteomyelitis.  Continue vancomycin and cefepime through 06/10/2020  1/17- WBC 8.1  1/18- will schedule turning q2 hours to get off wounds- also will make sure on specialty bed  1/20- is on specialty bed  1/27- off IV ABX for osteomyletis- WBC slightly elevated at 11.9- will check CRP and ESR for baseline?  1/28- WBC down to 9.5- CRP pending, but ESR is 30- which is great considering wounds  2/1- WBC up to 10.8- will recheck CRP/ESR and go from there.   2/2- WBC down to 9.8- ESR up somewhat to 73 and CRP down to 6.9- will con't to monitor, since getting some values up; some down-   2/4- recheck labs Monday  10.  Anemia of chronic disease.  Continue Aranesp. Hgb 8.8 on 1/16, repeat with HD  1/17- no CBC today, only BMP- will try and check with HD on Tuesday.   1/19- doing better based on labs yesterday  1/26- Hb 9.8- con't regimen 11.  Diabetes mellitus with peripheral neuropathy .  Hemoglobin A1c 5.6.  Lantus insulin 14 units nightly  1/22- seen by Diabetes coordinator they want me to decrease Lantus to 8 units, however Bgs running 86-120- which is ideal- will wait to decrease esp with all his wounds-  CBG (last 3)   Recent Labs    06/24/20 1656 06/24/20 2031 06/25/20 0614  GLUCAP 93 163* 93    2/4- BGs on average, are well controlled 93-163- con't regimen 12.  Morbid obesity.  BMI 38.37.  Dietary follow-up  13. Dizziness/nausea with HTN and hypotension     Vitals:   06/24/20 1947 06/25/20 0451  BP: 132/82 (!) 113/93  Pulse:  93 98  Resp: 18 18  Temp: 98.6 F (37 C) 98.3 F (36.8 C)  SpO2: 99% 100%   2/1- BP 153./92 this AM- and 140s/80 yesterday evening- doing better- will con't regimen for now.  2/2- added Hydralazine prn back- also renal increased Norvasc to 10 mg daily- con't regimen 2/4- Decreased Norvasc to 7.5 mg daily since had significant Orthostasis yesterday- down to 86/50s- needs ACE wraps and Abd binder when gets up.  14. Diarrhea with new constipation  1/18- LBM 3 days ago- on imodium 2 mg q4 hours around the clock- will change to q8 hours to see if can get him to have BM.  1/19- wil give Sorbitol 60 ml today since no BM- maintain lower dose of imodium per pt request.  1/20- thinks sorbitol made him sick- it's actually the ileus that made him sick- refusing NG tube- will give Suppository per pt request (KUB shows likely ileus) and changed diet to clear liquid diet for now. .   Pt states he was on 3 stool softeners on acute care which worked for him  Will start senna S 2 po BID   2/3- less incontinence- con't regimen  2/4- LBM 2 days ago- con't regimen as is.  15. Leukocytosis  1/26- will check CXR since WBC up to 11.9- Tm 99.1- not elevated.   1/27- CXR showed minimal atalectasis- will monitor- will also recheck labs in AM with ESR/CRP due to wounds.   sedrate mild elevation but unchanged vs 1 mo ago  2/1- WBC 10.8- will recheck labs in AM with CRP and ESR  2/2- WBC 9.8- better-    2/4- will recheck ESR/CRP and CBC with diff Monday.     LOS: 19 days A FACE TO [place] 06/25/2020, 8:40 AM

## 2020-06-26 DIAGNOSIS — I10 Essential (primary) hypertension: Secondary | ICD-10-CM

## 2020-06-26 DIAGNOSIS — R7309 Other abnormal glucose: Secondary | ICD-10-CM

## 2020-06-26 DIAGNOSIS — E1142 Type 2 diabetes mellitus with diabetic polyneuropathy: Secondary | ICD-10-CM

## 2020-06-26 DIAGNOSIS — D638 Anemia in other chronic diseases classified elsewhere: Secondary | ICD-10-CM

## 2020-06-26 DIAGNOSIS — Z992 Dependence on renal dialysis: Secondary | ICD-10-CM

## 2020-06-26 DIAGNOSIS — K5900 Constipation, unspecified: Secondary | ICD-10-CM

## 2020-06-26 DIAGNOSIS — N186 End stage renal disease: Secondary | ICD-10-CM

## 2020-06-26 LAB — CBC
HCT: 33.7 % — ABNORMAL LOW (ref 39.0–52.0)
Hemoglobin: 10 g/dL — ABNORMAL LOW (ref 13.0–17.0)
MCH: 25.5 pg — ABNORMAL LOW (ref 26.0–34.0)
MCHC: 29.7 g/dL — ABNORMAL LOW (ref 30.0–36.0)
MCV: 86 fL (ref 80.0–100.0)
Platelets: 286 10*3/uL (ref 150–400)
RBC: 3.92 MIL/uL — ABNORMAL LOW (ref 4.22–5.81)
RDW: 16.1 % — ABNORMAL HIGH (ref 11.5–15.5)
WBC: 8.1 10*3/uL (ref 4.0–10.5)
nRBC: 0 % (ref 0.0–0.2)

## 2020-06-26 LAB — RENAL FUNCTION PANEL
Albumin: 2.2 g/dL — ABNORMAL LOW (ref 3.5–5.0)
Anion gap: 11 (ref 5–15)
BUN: 52 mg/dL — ABNORMAL HIGH (ref 6–20)
CO2: 26 mmol/L (ref 22–32)
Calcium: 9.4 mg/dL (ref 8.9–10.3)
Chloride: 95 mmol/L — ABNORMAL LOW (ref 98–111)
Creatinine, Ser: 6.58 mg/dL — ABNORMAL HIGH (ref 0.61–1.24)
GFR, Estimated: 10 mL/min — ABNORMAL LOW (ref >60)
Glucose, Bld: 180 mg/dL — ABNORMAL HIGH (ref 70–99)
Phosphorus: 4.5 mg/dL (ref 2.5–4.6)
Potassium: 4.7 mmol/L (ref 3.5–5.1)
Sodium: 132 mmol/L — ABNORMAL LOW (ref 135–145)

## 2020-06-26 LAB — GLUCOSE, CAPILLARY
Glucose-Capillary: 121 mg/dL — ABNORMAL HIGH (ref 70–99)
Glucose-Capillary: 182 mg/dL — ABNORMAL HIGH (ref 70–99)
Glucose-Capillary: 155 mg/dL — ABNORMAL HIGH (ref 70–99)

## 2020-06-26 MED ORDER — POLYETHYLENE GLYCOL 3350 17 G PO PACK
17.0000 g | PACK | Freq: Two times a day (BID) | ORAL | Status: DC
  Filled 2020-06-26 (×3): qty 1

## 2020-06-26 NOTE — Progress Notes (Signed)
[place] PHYSICAL MEDICINE & REHABILITATION PROGRESS NOTE   Subjective/Complaints: Patient seen laying in bed this morning.  He states he slept well overnight.  He wants to sleep some more.  Notes he had a small bowel movement.  He was seen by nephrology yesterday, notes reviewed-no changes.  ROS: Denies CP, SOB, N/V/D  Objective:   No results found. Recent Labs    06/24/20 1314  WBC 9.9  HGB 10.0*  HCT 32.3*  PLT 255   Recent Labs    06/24/20 1314  NA 131*  K 5.3*  CL 93*  CO2 25  GLUCOSE 181*  BUN 59*  CREATININE 7.62*  CALCIUM 9.5    Intake/Output Summary (Last 24 hours) at 06/26/2020 0953 Last data filed at 06/26/2020 0857 Gross per 24 hour  Intake 237 ml  Output 676 ml  Net -439 ml     Pressure Injury 05/01/20 Buttocks Left Stage 4 - Full thickness tissue loss with exposed bone, tendon or muscle. packed with santyl gauze, dressed by WOCN, (Active)  05/01/20 0230  Location: Buttocks  Location Orientation: Left  Staging: Stage 4 - Full thickness tissue loss with exposed bone, tendon or muscle.  Wound Description (Comments): packed with santyl gauze, dressed by WOCN,  Present on Admission: Yes     Pressure Injury 05/01/20 Ankle Left;Lateral Unstageable - Full thickness tissue loss in which the base of the injury is covered by slough (yellow, tan, gray, green or brown) and/or eschar (tan, brown or black) in the wound bed. smal circular wound on the (Active)  05/01/20 0230  Location: Ankle  Location Orientation: Left;Lateral  Staging: Unstageable - Full thickness tissue loss in which the base of the injury is covered by slough (yellow, tan, gray, green or brown) and/or eschar (tan, brown or black) in the wound bed.  Wound Description (Comments): smal circular wound on the bone  Present on Admission: Yes     Pressure Injury 05/01/20 Foot Left;Posterior Unstageable - Full thickness tissue loss in which the base of the injury is covered by slough (yellow, tan,  gray, green or brown) and/or eschar (tan, brown or black) in the wound bed. 4th and 5th toes appear n (Active)  05/01/20 0230  Location: Foot (4th and 5th toes and lateral/anterior side of the sole)  Location Orientation: Left;Posterior  Staging: Unstageable - Full thickness tissue loss in which the base of the injury is covered by slough (yellow, tan, gray, green or brown) and/or eschar (tan, brown or black) in the wound bed.  Wound Description (Comments): 4th and 5th toes appear necrotic circular wound on sole  Present on Admission: Yes     Pressure Injury 05/01/20 Foot Right;Medial;Posterior Unstageable - Full thickness tissue loss in which the base of the injury is covered by slough (yellow, tan, gray, green or brown) and/or eschar (tan, brown or black) in the wound bed. large circular wo (Active)  05/01/20 0230  Location: Foot  Location Orientation: Right;Medial;Posterior  Staging: Unstageable - Full thickness tissue loss in which the base of the injury is covered by slough (yellow, tan, gray, green or brown) and/or eschar (tan, brown or black) in the wound bed.  Wound Description (Comments): large circular wound on sole  and heel  Present on Admission: Yes     Pressure Injury 05/08/20 Heel Right Unstageable - Full thickness tissue loss in which the base of the injury is covered by slough (yellow, tan, gray, green or brown) and/or eschar (tan, brown or black) in the wound  bed. (Active)  05/08/20 1718  Location: Heel  Location Orientation: Right  Staging: Unstageable - Full thickness tissue loss in which the base of the injury is covered by slough (yellow, tan, gray, green or brown) and/or eschar (tan, brown or black) in the wound bed.  Wound Description (Comments):   Present on Admission: Yes    Physical Exam: Vital Signs Blood pressure (!) 146/87, pulse 90, temperature 99.9 F (37.7 C), temperature source Oral, resp. rate 18, height 6' 6"  (1.981 m), weight (!) 153.8 kg, SpO2 99  %. Constitutional: No distress . Vital signs reviewed. HENT: Normocephalic.  Atraumatic. Eyes: EOMI. No discharge. Cardiovascular: No JVD.  RRR. Respiratory: Normal effort.  No stridor.  Bilateral clear to auscultation. GI: Non-distended.  BS +. Skin: Warm and dry.  Intact. Pressure ulcers not examined today Psych: Flat.  Disengaged. Musc: No edema in extremities.  No tenderness in extremities. Neuro: Alert Motor: Bilateral upper extremities: 5/5 proximal distal Bilateral lower extremities: 4 -/5 proximal distal (?  Participation)  Assessment/Plan: 1. Functional deficits which require 3+ hours per day of interdisciplinary therapy in a comprehensive inpatient rehab setting.  Physiatrist is providing close team supervision and 24 hour management of active medical problems listed below.  Physiatrist and rehab team continue to assess barriers to discharge/monitor patient progress toward functional and medical goals  Care Tool:  Bathing    Body parts bathed by patient: Right arm,Left arm,Chest,Abdomen,Right upper leg,Left upper leg,Face   Body parts bathed by helper: Right lower leg,Left lower leg,Buttocks     Bathing assist Assist Level: Minimal Assistance - Patient > 75% (declined LB today, EOB level)     Upper Body Dressing/Undressing Upper body dressing   What is the patient wearing?: Pull over shirt    Upper body assist Assist Level: Set up assist    Lower Body Dressing/Undressing Lower body dressing      What is the patient wearing?: Pants,Incontinence brief     Lower body assist Assist for lower body dressing: Minimal Assistance - Patient > 75%     Toileting Toileting    Toileting assist Assist for toileting: Maximal Assistance - Patient 25 - 49% Assistive Device Comment:  (urinal)   Transfers Chair/bed transfer  Transfers assist  Chair/bed transfer activity did not occur: Safety/medical concerns (Per nursing report)  Chair/bed transfer assist level:  Moderate Assistance - Patient 50 - 74% Chair/bed transfer assistive device: Sliding board   Locomotion Ambulation   Ambulation assist   Ambulation activity did not occur: Safety/medical concerns (fatigue, lightheadedness, generalized weakness)          Walk 10 feet activity   Assist  Walk 10 feet activity did not occur: Safety/medical concerns (fatigue, lightheadedness, generalized weakness)        Walk 50 feet activity   Assist Walk 50 feet with 2 turns activity did not occur: Safety/medical concerns (fatigue, lightheadedness, generalized weakness)         Walk 150 feet activity   Assist Walk 150 feet activity did not occur: Safety/medical concerns (fatigue, lightheadedness, generalized weakness)         Walk 10 feet on uneven surface  activity   Assist Walk 10 feet on uneven surfaces activity did not occur: Safety/medical concerns (fatigue, lightheadedness, generalized weakness)         Wheelchair     Assist Will patient use wheelchair at discharge?: Yes Type of Wheelchair: Manual    Wheelchair assist level: Supervision/Verbal cueing Max wheelchair distance: 100    Wheelchair  50 feet with 2 turns activity    Assist        Assist Level: Supervision/Verbal cueing   Wheelchair 150 feet activity     Assist      Assist Level: Minimal Assistance - Patient > 75%   Blood pressure (!) 146/87, pulse 90, temperature 99.9 F (37.7 C), temperature source Oral, resp. rate 18, height 6' 6"  (1.981 m), weight (!) 153.8 kg, SpO2 99 %.  Medical Problem List and Plan: 1.  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  Antithrombotics: -DVT/anticoagulation: Subcutaneous heparin.               -antiplatelet therapy: N/A 3. Pain Management: Tylenol as needed  Controlled on 2/5 4. Mood: Provide emotional support             -antipsychotic agents: N/A 5. Neuropsych: This patient is? capable of  making decisions on his own behalf. 6. Skin/Wound Care: Routine skin checks 7. Fluids/Electrolytes/Nutrition: Routine in and outs with follow-up chemistries 8.  ESRD.  Hemodialysis per renal services 9.  Sacral decubitus ulcer as well as ischemic ulcers on the left foot fourth toe and fifth metatarsal head and right heel and fifth metatarsal head.  Wound care nurse follow-up.  Patient to see Dr. Sharol Given outpatient.  Continue bilateral PRAFO's. 10.  ID/ coccygeal osteomyelitis.    Completed vancomycin and cefepime on 06/10/2020  1/18- will schedule turning q2 hours to get off wounds- also will make sure on specialty bed  1/20- is on specialty bed  Repeat labs ordered for Monday 10.  Anemia of chronic disease.  Continue Aranesp.   Hemoglobin 10.0 on 2/3, labs with HD 11.  Diabetes mellitus with peripheral neuropathy .  Hemoglobin A1c 5.6.  Lantus insulin 11 units nightly  CBG (last 3)  Recent Labs    06/25/20 1627 06/25/20 2105 06/26/20 0612  GLUCAP 175* 184* 121*    Slightly labile on 2/5 12.  Morbid obesity.  BMI 38.37.  Dietary follow-up 13. Dizziness/nausea with HTN and hypotension  Vitals:   06/25/20 2007 06/26/20 0401  BP: 130/86 (!) 146/87  Pulse: (!) 102 90  Resp: 17 18  Temp: 99.8 F (37.7 C) 99.9 F (37.7 C)  SpO2: 99% 99%    Hydralazine prn   Norvasc to 7.5 mg daily   Relatively controlled on 2/5 14. Diarrhea with new constipation  Bowel meds increased on 2/5 15. Leukocytosis: Resolved  ESR/CRP and CBC with diff ordered for Monday.     LOS: 20 days A FACE TO FACE EVALUATION WAS PERFORMED  Fred [name] 06/26/2020, 9:53 AM

## 2020-06-26 NOTE — Progress Notes (Signed)
Patient ID: Fred [name], male   DOB: 1971-02-16, 50 y.o.   MRN: [id number] S: Only complaint is being tired. O:BP (!) 146/87 (BP Location: Right Arm)   Pulse 90   Temp 99.9 F (37.7 C) (Oral)   Resp 18   Ht '6\' 6"'$  (1.981 m)   Wt (!) 153.8 kg   SpO2 99%   BMI 39.18 kg/m   Intake/Output Summary (Last 24 hours) at 06/26/2020 1110 Last data filed at 06/26/2020 0857 Gross per 24 hour  Intake 237 ml  Output 676 ml  Net -439 ml   Intake/Output: I/O last 3 completed shifts: In: 237 [P.O.:237] Out: 676 [Urine:675; Stool:1]  Intake/Output this shift:  No intake/output data recorded. Weight change:  Gen: NAD CVS: RRR Resp: cta Abd: +BS, soft, NT/DN Ext: no edema  Recent Labs  Lab 06/19/20 1541 06/23/20 0559 06/24/20 1314  NA 132* 132* 131*  K 4.6 4.7 5.3*  CL 96* 96* 93*  CO2 '25 26 25  '$ GLUCOSE 194* 93 181*  BUN 50* 41* 59*  CREATININE 6.69* 5.61* 7.62*  ALBUMIN 2.2* 2.3* 2.3*  CALCIUM 8.9 9.1 9.5  PHOS 4.4 4.5 5.0*   Liver Function Tests: Recent Labs  Lab 06/19/20 1541 06/23/20 0559 06/24/20 1314  ALBUMIN 2.2* 2.3* 2.3*   No results for input(s): LIPASE, AMYLASE in the last 168 hours. No results for input(s): AMMONIA in the last 168 hours. CBC: Recent Labs  Lab 06/19/20 1541 06/23/20 0559 06/24/20 1314  WBC 10.8* 9.8 9.9  NEUTROABS  --  5.8  --   HGB 9.9* 10.2* 10.0*  HCT 33.5* 34.3* 32.3*  MCV 87.5 87.1 85.0  PLT 269 251 255   Cardiac Enzymes: No results for input(s): CKTOTAL, CKMB, CKMBINDEX, TROPONINI in the last 168 hours. CBG: Recent Labs  Lab 06/25/20 0614 06/25/20 1151 06/25/20 1627 06/25/20 2105 06/26/20 0612  GLUCAP 93 155* 175* 184* 121*    Iron Studies: No results for input(s): IRON, TIBC, TRANSFERRIN, FERRITIN in the last 72 hours. Studies/Results: No results found. . (feeding supplement) PROSource Plus  30 mL Oral TID BM  . amLODipine  7.5 mg Oral QHS  . Chlorhexidine Gluconate Cloth  6 each Topical Q0600  . Chlorhexidine  Gluconate Cloth  6 each Topical Q0600  . darbepoetin (ARANESP) injection - DIALYSIS  200 mcg Intravenous Q Tue-HD  . famotidine  20 mg Oral QHS  . feeding supplement (NEPRO CARB STEADY)  237 mL Oral BID BM  . heparin injection (subcutaneous)  7,500 Units Subcutaneous Q8H  . insulin aspart  0-9 Units Subcutaneous TID WC  . insulin glargine  11 Units Subcutaneous QHS  . lanthanum  500 mg Oral TID WC  . metoCLOPramide  5 mg Oral TID AC  . multivitamin  1 tablet Oral QHS  . pneumococcal 23 valent vaccine  0.5 mL Intramuscular Tomorrow-1000  . polyethylene glycol  17 g Oral BID    BMET    Component Value Date/Time   NA 131 (L) 06/24/2020 1314   K 5.3 (H) 06/24/2020 1314   CL 93 (L) 06/24/2020 1314   CO2 25 06/24/2020 1314   GLUCOSE 181 (H) 06/24/2020 1314   BUN 59 (H) 06/24/2020 1314   CREATININE 7.62 (H) 06/24/2020 1314   CALCIUM 9.5 06/24/2020 1314   GFRNONAA 8 (L) 06/24/2020 1314   CBC    Component Value Date/Time   WBC 9.9 06/24/2020 1314   RBC 3.80 (L) 06/24/2020 1314   HGB 10.0 (L) 06/24/2020 1314   HCT  32.3 (L) 06/24/2020 1314   PLT 255 06/24/2020 1314   MCV 85.0 06/24/2020 1314   MCH 26.3 06/24/2020 1314   MCHC 31.0 06/24/2020 1314   RDW 16.5 (H) 06/24/2020 1314   LYMPHSABS 2.3 06/23/2020 0559   MONOABS 1.4 (H) 06/23/2020 0559   EOSABS 0.3 06/23/2020 0559   BASOSABS 0.1 06/23/2020 0559     Dialysis Orders: NWTTS 4h 400/800 143.5kg 3K/2.5Ca TDCHeparin3000units IV TIW - Mircera163mgIVq2wks - last 12/8 - Venofer '100mg'$ IVqHD x10 - completed 6/10 doses  Assessment/Plan: 1. Sacral decub/coccygealosteo:S/p 6 weeks ofVanc/Cefepime (finished1/20/22) - In CIR. 2. 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 ESRD:Back on usual TTS schedule.Does tolerate HD well without dizziness. 4. Diarrhea -Recurrent issue, thought to be d/t Renvelawhich has now been stoppedbut still having issues with diarrhea on  Fosrenol.Senna also stopped. Primary team suspects poor sphincter tone 5. BP/volume:Overall BP control is improved.Dizziness is not consistently associated with lower blood pressuresbut he did have + orthostatic hypotension today.Will let volume come up a bit, no UF with HD today. If this is not effective in reducing dizziness, may need to decrease amlodipine dosage further. 6. COVID PNA- tested positive on 11/30, is off isolation now.  7. Anemiaof CKD: Hgb10.2,cont Aranesp 2015mqSat.  8. Secondary Hyperparathyroidism:CorrCaelevated,use low Ca bath.Not on VDRA.Stoppedrenvela 2/2diarrhea, tryingFosrenol insteadPO4 at goal but still with diarrhea, not convinced binders are the issue since he has also had periods of constipation while on fosrenol.PO4 at goal. 9. Nutrition:Alb remains low, continue Nepro. 10. DMT2 - per admit   JoDonetta PottsMD CaRock[phone]

## 2020-06-26 NOTE — Progress Notes (Signed)
Occupational Therapy Session Note  Patient Details  Name: Phoenixx Hochberg MRN: [id number] Date of Birth: 13-Sep-1970  Today's Date: 06/26/2020 OT Individual Time: 1002-1058 OT Individual Time Calculation (min): 56 min   Short Term Goals: Week 3:  OT Short Term Goal 1 (Week 3): STGs=LTGs due to ELOS  Skilled Therapeutic Interventions/Progress Updates:    Pt greeted in bed with no c/o pain. Motivated to stand during session. BP while supine: 141/93. OT donned ACE wraps and then abdominal binder when pt was sitting EOB for BP mgt. Pt completed 5 sit<stands in [place] during session, standing for 27, 28, 24, 21, and 20 seconds. He required Mod A for sit<stand from bed height ~26 inches, Max A for sit<stand bed height ~24 inches. BP assessed after stands 1-4, reading: 111/84, 114/78, 131/94, and 130/80. Pt with fluctuating dizziness levels, ranging from mild-max, pt motivated to push through dizziness, reporting that it became more mild the more stands that he completed. While seated, pt participated in LE exercises for strengthening including seated marches, ankle pumps, long arc quads, and flutter kicks x1 minute holds 3 sets. He returned to bed at end of session, left with all needs within reach.   He also donned pants during session with setup assist  Therapy Documentation Precautions:  Precautions Precautions: Fall Precaution Comments: wounds on bilat feet and L sacrum, monitor orthostatic BPs Required Braces or Orthoses: Other Brace Other Brace: abdominal binder Restrictions Weight Bearing Restrictions: No ADL: ADL Eating: Set up,Supervision/safety Where Assessed-Eating: Bed level Grooming: Supervision/safety,Setup Where Assessed-Grooming: Sitting at sink,Wheelchair Upper Body Bathing: Minimal assistance Where Assessed-Upper Body Bathing: Wheelchair,Sitting at sink Lower Body Bathing: Moderate assistance Where Assessed-Lower Body Bathing: Bed level Upper Body Dressing:  Setup,Supervision/safety Where Assessed-Upper Body Dressing: Sitting at sink,Wheelchair Lower Body Dressing: Maximal assistance Where Assessed-Lower Body Dressing: Bed level      Therapy/Group: Individual Therapy  Lashannon Bresnan A Witt Plitt 06/26/2020, 12:37 PM

## 2020-06-27 LAB — GLUCOSE, CAPILLARY
Glucose-Capillary: 117 mg/dL — ABNORMAL HIGH (ref 70–99)
Glucose-Capillary: 137 mg/dL — ABNORMAL HIGH (ref 70–99)
Glucose-Capillary: 138 mg/dL — ABNORMAL HIGH (ref 70–99)
Glucose-Capillary: 133 mg/dL — ABNORMAL HIGH (ref 70–99)

## 2020-06-27 NOTE — Progress Notes (Signed)
Physical Therapy Session Note  [name] MRN: [id number] Date of Birth: 1970-10-26  Today's Date: 06/27/2020 PT Individual Time: 1415-1515 PT Individual Time Calculation (min): 60 min   Short Term Goals: Week 3:  PT Short Term Goal 1 (Week 3): Pt will transfer bed<>chair with LRAD and min A PT Short Term Goal 2 (Week 3): Pt will transfer sit<>stand with LRAD and mod A from WC PT Short Term Goal 3 (Week 3): pt will perform all bed mobility with CGA  Skilled Therapeutic Interventions/Progress Updates:     Patient in bed upon PT arrival. Patient alert and agreeable to PT session. Patient denied pain during session.  Applied 2-4" and 1-6" ACE wraps to B lower extremities and abdominal binder with total A  for BP management during mobility. Patient asymptomatic throughout session. Donned B socks with total A for safety with mobility.   Therapeutic Activity: Bed Mobility: Patient performed supine to/from sit with mod I with use of hospital bed features.  Transfers: Patient performed sit to/from stand x2 in the [place] and x2 in the // bars with min A and increased time to power-up. Provided verbal cues for forward weight shift and gluteal and quad activation to push up to standing. Patient remained in standing during transfers in the [facility] for increased upright tolerance and for standing balance with some perturbations, provided CGA throughout for safety.   Gait Training:  Patient ambulated 4 feet, 8 feet, and 12 feet forwards and backwards in the // bars with min A. Ambulated with decreased gait speed, decreased step length and height, forward trunk lean, and downward head gaze. Provided verbal cues for erect posture, increased step length and height, and increased gluteal and quad activation in stance for increased knee stability. Retrieved bariatric RW during session and education patient on progression to RW during next session due to performance in // bars today.  Patient anxious and eager to progress to RW.  Wheelchair Mobility:  Patient was transported in the w/c with total A throughout session for energy conservation and time management.  Patient in bed with ACE wraps and abdominal binder doffed with total A at end of session with breaks locked, bed alarm set, and all needs within reach.    Therapy Documentation Precautions:  Precautions Precautions: Fall Precaution Comments: wounds on bilat feet and L sacrum, monitor orthostatic BPs Required Braces or Orthoses: Other Brace Other Brace: abdominal binder Restrictions Weight Bearing Restrictions: No   Therapy/Group: Individual Therapy  [name] [name] PT, DPT  06/27/2020, 5:57 PM

## 2020-06-27 NOTE — Progress Notes (Signed)
Patient ID: Fred [name], male   DOB: May 15, 1971, 50 y.o.   MRN: [id number] S: No complaints O:BP 130/80 (BP Location: Right Arm)   Pulse 93   Temp 98.6 F (37 C) (Oral)   Resp 16   Ht '6\' 6"'$  (1.981 m)   Wt (!) 153.8 kg   SpO2 98%   BMI 39.18 kg/m   Intake/Output Summary (Last 24 hours) at 06/27/2020 1151 Last data filed at 06/27/2020 1018 Gross per 24 hour  Intake 240 ml  Output 450 ml  Net -210 ml   Intake/Output: I/O last 3 completed shifts: In: 240 [P.O.:240] Out: 1026 [Urine:1025; Stool:1]  Intake/Output this shift:  Total I/O In: -  Out: 100 [Urine:100] Weight change:  Gen: NAD CVS: RRR Resp: cta Abd: benign Ext: no edema  Recent Labs  Lab 06/23/20 0559 06/24/20 1314 06/26/20 1709  NA 132* 131* 132*  K 4.7 5.3* 4.7  CL 96* 93* 95*  CO2 '26 25 26  '$ GLUCOSE 93 181* 180*  BUN 41* 59* 52*  CREATININE 5.61* 7.62* 6.58*  ALBUMIN 2.3* 2.3* 2.2*  CALCIUM 9.1 9.5 9.4  PHOS 4.5 5.0* 4.5   Liver Function Tests: Recent Labs  Lab 06/23/20 0559 06/24/20 1314 06/26/20 1709  ALBUMIN 2.3* 2.3* 2.2*   No results for input(s): LIPASE, AMYLASE in the last 168 hours. No results for input(s): AMMONIA in the last 168 hours. CBC: Recent Labs  Lab 06/23/20 0559 06/24/20 1314 06/26/20 1709  WBC 9.8 9.9 8.1  NEUTROABS 5.8  --   --   HGB 10.2* 10.0* 10.0*  HCT 34.3* 32.3* 33.7*  MCV 87.1 85.0 86.0  PLT 251 255 286   Cardiac Enzymes: No results for input(s): CKTOTAL, CKMB, CKMBINDEX, TROPONINI in the last 168 hours. CBG: Recent Labs  Lab 06/26/20 0612 06/26/20 1158 06/26/20 2049 06/27/20 0634 06/27/20 1149  GLUCAP 121* 182* 155* 117* 137*    Iron Studies: No results for input(s): IRON, TIBC, TRANSFERRIN, FERRITIN in the last 72 hours. Studies/Results: No results found. . (feeding supplement) PROSource Plus  30 mL Oral TID BM  . amLODipine  7.5 mg Oral QHS  . Chlorhexidine Gluconate Cloth  6 each Topical Q0600  . Chlorhexidine Gluconate Cloth  6 each  Topical Q0600  . darbepoetin (ARANESP) injection - DIALYSIS  200 mcg Intravenous Q Tue-HD  . famotidine  20 mg Oral QHS  . feeding supplement (NEPRO CARB STEADY)  237 mL Oral BID BM  . heparin injection (subcutaneous)  7,500 Units Subcutaneous Q8H  . insulin aspart  0-9 Units Subcutaneous TID WC  . insulin glargine  11 Units Subcutaneous QHS  . lanthanum  500 mg Oral TID WC  . metoCLOPramide  5 mg Oral TID AC  . multivitamin  1 tablet Oral QHS  . pneumococcal 23 valent vaccine  0.5 mL Intramuscular Tomorrow-1000  . polyethylene glycol  17 g Oral BID    BMET    Component Value Date/Time   NA 132 (L) 06/26/2020 1709   K 4.7 06/26/2020 1709   CL 95 (L) 06/26/2020 1709   CO2 26 06/26/2020 1709   GLUCOSE 180 (H) 06/26/2020 1709   BUN 52 (H) 06/26/2020 1709   CREATININE 6.58 (H) 06/26/2020 1709   CALCIUM 9.4 06/26/2020 1709   GFRNONAA 10 (L) 06/26/2020 1709   CBC    Component Value Date/Time   WBC 8.1 06/26/2020 1709   RBC 3.92 (L) 06/26/2020 1709   HGB 10.0 (L) 06/26/2020 1709   HCT 33.7 (L) 06/26/2020  1709   PLT 286 06/26/2020 1709   MCV 86.0 06/26/2020 1709   MCH 25.5 (L) 06/26/2020 1709   MCHC 29.7 (L) 06/26/2020 1709   RDW 16.1 (H) 06/26/2020 1709   LYMPHSABS 2.3 06/23/2020 0559   MONOABS 1.4 (H) 06/23/2020 0559   EOSABS 0.3 06/23/2020 0559   BASOSABS 0.1 06/23/2020 0559    Dialysis Orders: NWTTS 4h 400/800 143.5kg 3K/2.5Ca TDCHeparin3000units IV TIW - Mircera181mgIVq2wks - last 12/8 - Venofer '100mg'$ IVqHD x10 - completed 6/10 doses  Assessment/Plan: 1. Sacral decub/coccygealosteo:S/p 6 weeks ofVanc/Cefepime (finished1/20/22) - In CIR. 2. 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 ESRD:Back on usual TTS schedule.Does tolerate HD well without dizziness. 4. Diarrhea -Recurrent issue, thought to be d/t Renvelawhich has now been stoppedbut still having issues with diarrhea on Fosrenol.Senna also stopped.  Primary team suspects poor sphincter tone 5. BP/volume:Overall BP control is improved.Dizziness is not consistently associated with lower blood pressuresbut he did have + orthostatic hypotension today.Will let volume come up a bit, no UF with HD today. If this is not effective in reducing dizziness, may need to decrease amlodipine dosage further. 6. COVID PNA- tested positive on 11/30, is off isolation now.  7. Anemiaof CKD: Hgb10.2,cont Aranesp 2043mqSat.  8. Secondary Hyperparathyroidism:CorrCaelevated,use low Ca bath.Not on VDRA.Stoppedrenvela 2/2diarrhea, tryingFosrenol insteadPO4 at goal but still with diarrhea, not convinced binders are the issue since he has also had periods of constipation while on fosrenol.PO4 at goal. 9. Nutrition:Alb remains low, continue Nepro. 10. DMT2 - per admit   JoDonetta PottsMD CaJohn [phone]

## 2020-06-27 NOTE — Plan of Care (Signed)
  Problem: RH BOWEL ELIMINATION Goal: RH STG MANAGE BOWEL WITH ASSISTANCE Description: STG Manage Bowel with min Assistance. Outcome: progressing  Problem: RH SKIN INTEGRITY Goal: RH STG SKIN FREE OF INFECTION/BREAKDOWN Description: Skin to remain free from infection and breakdown with min assist. Outcome: Not Progressing; continue dressing changes

## 2020-06-28 LAB — CBC WITH DIFFERENTIAL/PLATELET
Abs Immature Granulocytes: 0.06 10*3/uL (ref 0.00–0.07)
Basophils Absolute: 0 10*3/uL (ref 0.0–0.1)
Basophils Relative: 0 %
Eosinophils Absolute: 0.3 10*3/uL (ref 0.0–0.5)
Eosinophils Relative: 4 %
HCT: 34 % — ABNORMAL LOW (ref 39.0–52.0)
Hemoglobin: 10.9 g/dL — ABNORMAL LOW (ref 13.0–17.0)
Immature Granulocytes: 1 %
Lymphocytes Relative: 23 %
Lymphs Abs: 2.1 10*3/uL (ref 0.7–4.0)
MCH: 26.9 pg (ref 26.0–34.0)
MCHC: 32.1 g/dL (ref 30.0–36.0)
MCV: 84 fL (ref 80.0–100.0)
Monocytes Absolute: 1.1 10*3/uL — ABNORMAL HIGH (ref 0.1–1.0)
Monocytes Relative: 11 %
Neutro Abs: 5.7 10*3/uL (ref 1.7–7.7)
Neutrophils Relative %: 61 %
Platelets: 330 10*3/uL (ref 150–400)
RBC: 4.05 MIL/uL — ABNORMAL LOW (ref 4.22–5.81)
RDW: 16.2 % — ABNORMAL HIGH (ref 11.5–15.5)
WBC: 9.3 10*3/uL (ref 4.0–10.5)
nRBC: 0 % (ref 0.0–0.2)

## 2020-06-28 LAB — GLUCOSE, CAPILLARY
Glucose-Capillary: 106 mg/dL — ABNORMAL HIGH (ref 70–99)
Glucose-Capillary: 147 mg/dL — ABNORMAL HIGH (ref 70–99)
Glucose-Capillary: 132 mg/dL — ABNORMAL HIGH (ref 70–99)
Glucose-Capillary: 142 mg/dL — ABNORMAL HIGH (ref 70–99)

## 2020-06-28 LAB — RENAL FUNCTION PANEL
Albumin: 2.2 g/dL — ABNORMAL LOW (ref 3.5–5.0)
Anion gap: 10 (ref 5–15)
BUN: 45 mg/dL — ABNORMAL HIGH (ref 6–20)
CO2: 25 mmol/L (ref 22–32)
Calcium: 9.7 mg/dL (ref 8.9–10.3)
Chloride: 99 mmol/L (ref 98–111)
Creatinine, Ser: 6.1 mg/dL — ABNORMAL HIGH (ref 0.61–1.24)
GFR, Estimated: 11 mL/min — ABNORMAL LOW (ref >60)
Glucose, Bld: 103 mg/dL — ABNORMAL HIGH (ref 70–99)
Phosphorus: 4.2 mg/dL (ref 2.5–4.6)
Potassium: 4.2 mmol/L (ref 3.5–5.1)
Sodium: 134 mmol/L — ABNORMAL LOW (ref 135–145)

## 2020-06-28 LAB — SEDIMENTATION RATE: Sed Rate: 67 mm/hr — ABNORMAL HIGH (ref 0–16)

## 2020-06-28 LAB — C-REACTIVE PROTEIN: CRP: 6.5 mg/dL — ABNORMAL HIGH (ref <1.0)

## 2020-06-28 MED ORDER — POLYETHYLENE GLYCOL 3350 17 G PO PACK
17.0000 g | PACK | Freq: Every day | ORAL | Status: DC
  Filled 2020-06-28 (×3): qty 1

## 2020-06-28 MED ORDER — LOPERAMIDE HCL 2 MG PO CAPS
4.0000 mg | ORAL_CAPSULE | Freq: Once | ORAL | Status: AC
  Administered 2020-06-28: 4 mg via ORAL
  Filled 2020-06-28: qty 2

## 2020-06-28 MED ORDER — CHLORHEXIDINE GLUCONATE CLOTH 2 % EX PADS
6.0000 | MEDICATED_PAD | Freq: Two times a day (BID) | CUTANEOUS | Status: DC
  Administered 2020-06-28 – 2020-07-16 (×28): 6 via TOPICAL

## 2020-06-28 NOTE — Progress Notes (Signed)
Orthopedic Tech Progress Note Patient Details:  Fred [name] 03/24/1971 [id number] Delivered abdominal binder to patients room per RN request Ortho Devices Type of Ortho Device: Abdominal binder Ortho Device/Splint Interventions: Ordered       Tammy Sours 06/28/2020, 2:57 PM

## 2020-06-28 NOTE — Progress Notes (Signed)
Occupational Therapy Session Note  [name] MRN: [id number] Date of Birth: 1970/06/08  Today's Date: 06/28/2020 OT Individual Time: 1302-1400 OT Individual Time Calculation (min): 58 min   Skilled Therapeutic Interventions/Progress Updates:    Pt greeted in bed with no c/o pain. Very motivated to stand. Tried sit<stand from bed ~22 inches high with pt unable to power up fully with Max A of 1 using bariatric RW. Increased bed height to ~25 inches and he was able to complete sit<stand x3 with Max A using the RW. Pt stood for 57 sec, 1 min 5 sec, and 1 minute 6 sec intervals. While standing with Min balance assist, pt completed forward/backward stepping 5 reps and lateral weight shifts, also able to lift 1 UE off of the walker for short windows of time. While seated, he participated in LE marches (15 reps 2 sets), long quad arcs (15 reps 2 sets), flutter kicks + straight leg raises x1 minute holds x2 sets. Also reviewed gentle hip and hamstring stretches using the strap, pt mindful of the locations of wounds on his foot when placing the strap. At end of session pt was left via PT handoff.   No s/s orthostatics during tx, he wore the ACE wraps but not the abdominal binder  Therapy Documentation Precautions:  Precautions Precautions: Fall Precaution Comments: wounds on bilat feet and L sacrum, monitor orthostatic BPs Required Braces or Orthoses: Other Brace Other Brace: abdominal binder Restrictions Weight Bearing Restrictions: No Vital Signs: Therapy Vitals Temp: 98.3 F (36.8 C) Temp Source: Oral Pulse Rate: 96 Resp: 14 BP: 123/90 Patient Position (if appropriate): Lying Oxygen Therapy SpO2: 100 % O2 Device: Room Air ADL: ADL Eating: Set up,Supervision/safety Where Assessed-Eating: Bed level Grooming: Supervision/safety,Setup Where Assessed-Grooming: Sitting at sink,Wheelchair Upper Body Bathing: Minimal assistance Where Assessed-Upper Body Bathing:  Wheelchair,Sitting at sink Lower Body Bathing: Moderate assistance Where Assessed-Lower Body Bathing: Bed level Upper Body Dressing: Setup,Supervision/safety Where Assessed-Upper Body Dressing: Sitting at sink,Wheelchair Lower Body Dressing: Maximal assistance Where Assessed-Lower Body Dressing: Bed level     Therapy/Group: Individual Therapy  [name] [name] 06/28/2020, 3:41 PM

## 2020-06-28 NOTE — Progress Notes (Signed)
Patient ID: Fred [name], male   DOB: 10/22/1970, 50 y.o.   MRN: [id number]  Family has re-schedule family education due to weather until Wed. Will discuss best plan then home versus SNF

## 2020-06-28 NOTE — Progress Notes (Signed)
Physical Therapy Weekly Progress Note  [name] MRN: [id number] Date of Birth: 1970/05/25  Beginning of progress report period: June 21, 2020 End of progress report period: June 28, 2020  Today's Date: 06/28/2020 PT Individual Time: 1000-1100 and 1400- 1455 PT Individual Time Calculation (min): 60 min and 55 mins  Patient has met 3 of 3 short term goals.  Pt has shown great progress since last progress note with overall tolerance to activity, trnasfers and gait training. Pt currently grossly CGA-SBA for bed mobility; CGA-min A for slide board transfers, min -mod A for Sit to stand with Rolling walker and has initiated gait training in // and Rolling walker with min -mod A. Pt does vary level of assist based on fatigue levels however tolerance has improved and pt has been limited with blood pressure regulations since admission.   Patient continues to demonstrate the following deficits muscle weakness, decreased cardiorespiratoy endurance and decreased sitting balance, decreased standing balance, decreased postural control and decreased balance strategies and therefore will continue to benefit from skilled PT intervention to increase functional independence with mobility.  Patient progressing toward long term goals..  Continue plan of care.  PT Short Term Goals Week 1:  PT Short Term Goal 1 (Week 1): pt will perform all bed mobility with CGA PT Short Term Goal 1 - Progress (Week 1): Not met PT Short Term Goal 2 (Week 1): Pt will transfer sit<>stand with LRAD and mod A PT Short Term Goal 2 - Progress (Week 1): Not met PT Short Term Goal 3 (Week 1): Pt will transfer bed<>chair with LRAD and min A PT Short Term Goal 3 - Progress (Week 1): Not met Week 2:  PT Short Term Goal 1 (Week 2): Pt will transfer bed<>chair with LRAD and min A PT Short Term Goal 1 - Progress (Week 2): Progressing toward goal PT Short Term Goal 2 (Week 2): Pt will transfer sit<>stand with  LRAD and mod A PT Short Term Goal 2 - Progress (Week 2): Met PT Short Term Goal 3 (Week 2): pt will perform all bed mobility with CGA PT Short Term Goal 3 - Progress (Week 2): Progressing toward goal PT Short Term Goal 4 (Week 2): pt will recall pressure relief schedule and safe techniques with pressure relief PT Short Term Goal 4 - Progress (Week 2): Met PT Short Term Goal 5 (Week 2): pt will tolerate sitting OOB for 45 mins without adverse effects PT Short Term Goal 5 - Progress (Week 2): Met Week 3:  PT Short Term Goal 1 (Week 3): Pt will transfer bed<>chair with LRAD and min A PT Short Term Goal 1 - Progress (Week 3): Met (with slide board, mod A with RW) PT Short Term Goal 2 (Week 3): Pt will transfer sit<>stand with LRAD and mod A from WC PT Short Term Goal 2 - Progress (Week 3): Met PT Short Term Goal 3 (Week 3): pt will perform all bed mobility with CGA PT Short Term Goal 3 - Progress (Week 3): Met  Skilled Therapeutic Interventions/Progress Updates:    session 1: pt received in bed and agreeable to therapy. Pt's BP taken to be 160/101, no symptoms. PT donned BLE ace wraps max A, and abdominal binder min A. Pt directed in supine>sit SBA. Pt directed in sitting EOB for BP monitoring SBA, 140/72 and pt directed in Sit to stand from EOB to Rolling walker at min A and Stand pivot transfer to [facility] with Rolling walker min  A with VC for technique. Pt then taken to gym total A for time and energy. Pt directed in gait training with WC follow with min A for 7 steps and 4 steps mod A with VC for trunk extension and pelvic stability. Pt required physical assist for balance and decreased fall risk with gait but overall great improvement as pt has previously been unable to ambulate or transfer with Rolling walker. Pt then reported fatigue post second rep of gait and requested to do a seated activity. Pt directed in seated BLE strengthening exercises with 2.5# ankle weight for marching, LAQ, and hip abduction  and adduction and 3# arm bar for UE strengthening exercises of chest press, bicep curls and overhead press all x15. Pt requested to return to bed at end of session and returned to room in WC total A at end of session for time. Pt directed in Stand pivot transfer with Rolling walker however pt unable to clear WC seat 2/2 fatigue. Pt ultimately required slide board transfer to bedside CGA to complete and SBA for sit>supine and positioning in bed. Pt left in bed, All needs in reach and in good condition. Call light in hand.    Session 2: pt received sitting EOB with OT and agreeable to therapy with PT. Pt denied pain or dizziness. Pt directed in sitting EOB therapeutic activity with reaching overhead stretching for stretching trunk and BUE and coordinated breathing 3x30s. Pt directed in Sit to stand from EOB to Rolling walker however pot unable to clear bed with x3 attempts 2/2 fatigue. Pt reported "my legs feel like jello but I want to stand more and more". Pt directed in rest break then reattempted Sit to stand from EOB to Rolling walker unable to clear bed to achieve standing. Pt just completed therapy with OT and reported just feeling very tired. Pt directed in seated chest stretches with 5x30s "T"s with wide clap, shoulder retractions x10, posterior shoulder rolls 2x10 elevated BUE arm bike 5x30s and BLE strengthening exercises of LAQ with 3s holds, marches with 3s holds. Pt required rest breaks post each set 2/2 increased fatigue at sitting upright. Pt required to return to supine at end of session. Pt directed in sit>supine SBA. Pt directed in supine exercises of 2x10 bridges and hip abduction/adduction clamshells, ankle pumps, glute squeezes. Pt left in supine, All needs in reach and in good condition. Call light in hand.  Pt denied pain and dizziness throughout.   Therapy Documentation Precautions:  Precautions Precautions: Fall Precaution Comments: wounds on bilat feet and L sacrum, monitor  orthostatic BPs Required Braces or Orthoses: Other Brace Other Brace: abdominal binder Restrictions Weight Bearing Restrictions: No General:   Vital Signs:  Pain:   Vision/Perception     Mobility:   Locomotion :    Trunk/Postural Assessment :    Balance:   Exercises:   Other Treatments:     Therapy/Group: Individual Therapy   S  06/28/2020, 1:42 PM  

## 2020-06-28 NOTE — Progress Notes (Addendum)
Subjective: today Reports diarrhea has returned , prior phosphate binder change did not change any frequency.  He is on schedule for TTS HD.  Objective Vital signs in last 24 hours: Vitals:   06/27/20 1324 06/27/20 1833 06/27/20 1943 06/28/20 0520  BP: 120/88 (!) 152/99 (!) 151/95 (!) 128/91  Pulse: 99 96 100 90  Resp: '16  16 18  '$ Temp: 98.4 F (36.9 C)  98.9 F (37.2 C) 98.4 F (36.9 C)  TempSrc:    Oral  SpO2: 100%  100% 97%  Height:       Weight change:   Physical Exam: General:Obese ,WDWN male, alert and in NAD Heart:RRR, no MRG Lungs:CTA bilaterally without wheezing, rhonchi or rales Abdomen:Obese, soft, non-tender, non-distended, +BS Extremities:No edema b/l lower extremities Dialysis Access:R IJ TDC dressing dry clear  Dialysis Orders: NWTTS 4h 400/800 143.5kg 3K/2.5Ca TDCHeparin3000units IV TIW - Mircera157mgIVq2wks - last 12/8 - Venofer '100mg'$ IVqHD x10 - completed 6/10 doses  Problem/Plan: 1. Sacral decub/coccygealosteo:S/p 6 weeks ofVanc/Cefepime (finished1/20/22) - In CIR. 2. 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 ESRD HD [id number] usual TTS schedule. Last reported did  tolerate HD well without dizziness.  Only 2-1/2 HD treatment due Vast ^^ patient load 4. Diarrhea -Recurrent issue, prior thought to be d/t Renvelawhich has now been stoppedbut still having issues with diarrhea on Fosrenol.Senna also stopped. Primary team suspects poor sphincter tone 5. BP/volume:Overall BP control is improved.Dizziness is not consistently associated with lower blood pressuresbut he did have + orthostatic hypotension 2/06.Will let volume come up a bit, no UF with HD tomorrow/ if this is not effective in reducing dizziness, may need to decrease amlodipine dosage further. 6. COVID PNA- tested positive on 11/30, is off isolation now.  7. Anemiaof CKD: Hgb10.9cont Aranesp 2040mqSat.  8. Secondary  Hyperparathyroidism:CorrCaelevated,use low Ca bath.Not on VDRA.on Fosrenol with PO4  4.2 but still with diarrhea, not convinced binders are the issue since he has also had periods of constipation while on fosrenol.PO4 at goal. 9. Nutrition:Alb 2.2 remains low, continue Nepro. 10. DMT2 - per admit  DaErnest HaberPA-C [id number]/11/2020,11:40 AM  LOS: 22 days   Pt seen, examined and agree w A/P as above.  RoKelly SplinterMD 06/28/2020, 5:30 PM    Labs: Basic Metabolic Panel: Recent Labs  Lab 06/24/20 1314 06/26/20 1709 06/28/20 0537  NA 131* 132* 134*  K 5.3* 4.7 4.2  CL 93* 95* 99  CO2 '25 26 25  '$ GLUCOSE 181* 180* 103*  BUN 59* 52* 45*  CREATININE 7.62* 6.58* 6.10*  CALCIUM 9.5 9.4 9.7  PHOS 5.0* 4.5 4.2   Liver Function Tests: Recent Labs  Lab 06/24/20 1314 06/26/20 1709 06/28/20 0537  ALBUMIN 2.3* 2.2* 2.2*   No results for input(s): LIPASE, AMYLASE in the last 168 hours. No results for input(s): AMMONIA in the last 168 hours. CBC: Recent Labs  Lab 06/23/20 0559 06/24/20 1314 06/26/20 1709 06/28/20 0537  WBC 9.8 9.9 8.1 9.3  NEUTROABS 5.8  --   --  5.7  HGB 10.2* 10.0* 10.0* 10.9*  HCT 34.3* 32.3* 33.7* 34.0*  MCV 87.1 85.0 86.0 84.0  PLT 251 255 286 330   Cardiac Enzymes: No results for input(s): CKTOTAL, CKMB, CKMBINDEX, TROPONINI in the last 168 hours. CBG: Recent Labs  Lab 06/27/20 1149 06/27/20 1726 06/27/20 2102 06/28/20 0637 06/28/20 1124  GLUCAP 137* 138* 133* 106* 147*    Studies/Results: No results found. Medications:  . (feeding  supplement) PROSource Plus  30 mL Oral TID BM  . amLODipine  7.5 mg Oral QHS  . Chlorhexidine Gluconate Cloth  6 each Topical BID  . darbepoetin (ARANESP) injection - DIALYSIS  200 mcg Intravenous Q Tue-HD  . famotidine  20 mg Oral QHS  . feeding supplement (NEPRO CARB STEADY)  237 mL Oral BID BM  . heparin injection (subcutaneous)  7,500 Units Subcutaneous Q8H   . insulin aspart  0-9 Units Subcutaneous TID WC  . insulin glargine  11 Units Subcutaneous QHS  . lanthanum  500 mg Oral TID WC  . loperamide  4 mg Oral Once  . metoCLOPramide  5 mg Oral TID AC  . multivitamin  1 tablet Oral QHS  . pneumococcal 23 valent vaccine  0.5 mL Intramuscular Tomorrow-1000  . [START ON 06/29/2020] polyethylene glycol  17 g Oral Daily

## 2020-06-28 NOTE — NC FL2 (Addendum)
[place] LEVEL OF CARE SCREENING TOOL     IDENTIFICATION  Patient Name: Fred [name] Birthdate: [birth year] Sex: male Admission Date (Current Location): 06/06/2020  [place] and [place] Number:  Kathleen Argue [id number] [place] and Address:  [facility] [place]. [facility], [place] [address], [place], [place] [address]      Provider Number: [id number]  Attending Physician Name and Address:  Courtney Heys, MD  Relative Name and Phone Number:  Berton Lan [phone]    Current Level of Care: Other (Comment) (Rehab) Recommended Level of Care: [place] Prior Approval Number:    Date Approved/Denied:   PASRR Number: [id number]  Discharge Plan: SNF    Current Diagnoses: Patient Active Problem List   Diagnosis Date Noted  . Anemia of chronic disease   . ESRD on dialysis ([place])   . Diabetic peripheral neuropathy ([place])   . Labile blood glucose   . Essential hypertension   . Constipation   . 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 Avascular necrosis of first metatarsal, left foot ([place]) 04/30/2020  . Osteomyelitis of dorsal first metatarsal, left foot ([place]) 04/30/2020  . Decubitus ulcer of sacral region, unstageable ([place]) 04/30/2020  . Morbid obesity ([place]) 04/30/2020  . Multiple open wounds of foot   . Insulin dependent type 2 diabetes mellitus ([place]) 04/29/2020  . HTN (hypertension) 04/29/2020  . History of anemia due to chronic kidney disease 04/29/2020  . Sacral osteomyelitis ([place]) 04/29/2020  . Pressure injury of both heels, unstageable ([place]) 04/29/2020  . Pneumonia due to COVID-19 virus 04/29/2020  . ESRD (end stage renal disease) ([place]) 04/29/2020    Orientation RESPIRATION BLADDER Height & Weight     Self,Time,Situation,Place  Normal Continent Weight:  (Bed not zero) Height:  '6\' 6"'$  (198.1 cm)  BEHAVIORAL SYMPTOMS/MOOD NEUROLOGICAL BOWEL NUTRITION STATUS      Continent,Incontinent Diet (Mod carb diet)  AMBULATORY STATUS  COMMUNICATION OF NEEDS Skin   Extensive Assist Verbally Other (Comment) (Wounds feet and sacrum)                       Personal Care Assistance Level of Assistance  Bathing,Dressing,Feeding Bathing Assistance: Limited assistance Feeding assistance: Limited assistance Dressing Assistance: Limited assistance     Functional Limitations Info             SPECIAL CARE FACTORS FREQUENCY  PT (By licensed PT),OT (By licensed OT),Bowel and bladder program Blood Pressure Frequency: BP issues wearng abdominal binder when up right and when in therapies   PT Frequency: 5x week OT Frequency: 5x week Bowel and Bladder Program Frequency: Bowel program does not feel when needs to have a bowel movement          Contractures Contractures Info: Not present    Additional Factors Info  Code Status,Allergies Code Status Info: Full Code Allergies Info: Atorvastatin           Current Medications (06/28/2020):  This is the current hospital active medication list Current Facility-Administered Medications  Medication Dose Route Frequency Provider Last Rate Last Admin  . (feeding supplement) PROSource Plus liquid 30 mL  30 mL Oral TID BM Angiulli, Daniel J, PA-C   30 mL at 06/28/20 1326  . acetaminophen (TYLENOL) tablet 650 mg  650 mg Oral Q6H PRN Cathlyn Parsons, PA-C   650 mg at 06/07/20 1445  . amLODipine (NORVASC) tablet 7.5 mg  7.5 mg Oral QHS Lovorn, Megan, MD   7.5 mg at 06/27/20 1833  . bisacodyl (DULCOLAX)  suppository 10 mg  10 mg Rectal Daily PRN Lovorn, Jinny Blossom, MD   10 mg at 06/13/20 1541  . Chlorhexidine Gluconate Cloth 2 % PADS 6 each  6 each Topical BID Lovorn, Megan, MD      . Darbepoetin Alfa (ARANESP) injection 200 mcg  200 mcg Intravenous Q Tue-HD Lovorn, Megan, MD   200 mcg at 06/22/20 1603  . famotidine (PEPCID) tablet 20 mg  20 mg Oral QHS Cathlyn Parsons, PA-C   20 mg at 06/27/20 2132  . feeding supplement (NEPRO CARB STEADY) liquid 237 mL  237 mL Oral BID BM  Cathlyn Parsons, PA-C   237 mL at 06/27/20 1037  . heparin injection 7,500 Units  7,500 Units Subcutaneous Q8H Cathlyn Parsons, PA-C   7,500 Units at 06/28/20 1325  . hydrALAZINE (APRESOLINE) tablet 25 mg  25 mg Oral Q8H PRN Lovorn, Megan, MD      . insulin aspart (novoLOG) injection 0-9 Units  0-9 Units Subcutaneous TID WC Angiulli, Lavon Paganini, PA-C   1 Units at 06/28/20 1200  . insulin glargine (LANTUS) injection 11 Units  11 Units Subcutaneous QHS Lovorn, Jinny Blossom, MD   11 Units at 06/27/20 2143  . lanthanum (FOSRENOL) chewable tablet 500 mg  500 mg Oral TID WC Penninger, Lindsay, PA   500 mg at 06/28/20 1200  . loperamide (IMODIUM) capsule 2 mg  2 mg Oral Q8H PRN Lovorn, Megan, MD      . metoCLOPramide (REGLAN) tablet 5 mg  5 mg Oral TID AC Angiulli, Lavon Paganini, PA-C   5 mg at 06/27/20 1833  . multivitamin (RENA-VIT) tablet 1 tablet  1 tablet Oral QHS Cathlyn Parsons, PA-C   1 tablet at 06/27/20 2132  . ondansetron (ZOFRAN) tablet 4 mg  4 mg Oral Q6H PRN Cathlyn Parsons, PA-C   4 mg at 06/24/20 0810   Or  . ondansetron (ZOFRAN) injection 4 mg  4 mg Intravenous Q6H PRN Cathlyn Parsons, PA-C   4 mg at 06/12/20 1235  . pneumococcal 23 valent vaccine (PNEUMOVAX-23) injection 0.5 mL  0.5 mL Intramuscular Tomorrow-1000 Lovorn, Megan, MD      . Derrill Memo ON 06/29/2020] polyethylene glycol (MIRALAX / GLYCOLAX) packet 17 g  17 g Oral Daily Lovorn, Megan, MD      . prochlorperazine (COMPAZINE) tablet 10 mg  10 mg Oral Q6H PRN Lovorn, Megan, MD   10 mg at 06/11/20 [id number]     Discharge Medications: Please see discharge summary for a list of discharge medications.  Relevant Imaging Results:  Relevant Lab Results:   Additional Information SSN: [id number] Wound care needs. 1) Feet-wash both feet witrh saop and water, rinse and pat dry. Apply betadine to all unstageable areas on feet by painting on throughly ns allowing to air dry. Apply daily. Place feet in prevalon boots. 2). Sacrum-insert saline  moistened gauze into sacral wound. Cover with ABD pads, tape in place ESRD-T,T,S HD currently has gone to [facility] in [place], [place], [place]

## 2020-06-28 NOTE — Consult Note (Signed)
Neuropsychological Consultation   Patient:   Fred [name]   DOB:   [birth year]  MR Number:  [id number]  Location:  [facility] [address] C[facility] [address] [place] [id number] [place] [address] Dept: [place]: [phone]           Date of Service:   06/28/2020  Start Time:   8 AM End Time:   9 AM  Provider/Observer:  Ilean Skill, Psy.D.       Clinical Neuropsychologist       Billing Code/Service: 96158/[id number]  Chief Complaint:    Fred [name] is a 50 year old male with history of hypertension, diabetes, obesity, end-stage renal disease with hemodialysis.  There is reported medical noncompliance with hypertensive medication.  Patient has a very complicated medical history with significant illness.  Patient has been living with his 71 year old father independent prior to initial admission to outside hospital after having her gallbladder removed and had a subsequent fall while in the hospital.  Patient remained in bed for extended period of time and was eventually transferred to skilled nursing..  Patient presented on 04/29/2020 from skilled nursing facility where he has been for an extended time for events prior and tested positive for COVID-19 on 04/20/2020.  Patient had had issues related to his gallbladder and essentially was bedbound developing multiple significant wound sites.  Patient had noted bilateral airspace disease correlated with COVID-19 status.  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  Patient referred for CIR due to profound deconditioning and multi medical issues.  Patient is now preliminarily scheduled for discharge on 2/14 and has been making some improvements but continues to be quite debilitated but is making progress in transfers from bed to wheelchair and able to stand and increasing length of time with study.  Reason for Service:  Patient was referred for  neuropsychological consultation due to significant coping and adjustment issues and severe deconditioning over an extended period of time.  Below is the HPI for the current admission.  HPI: Fred [name] is a 50 year old right-handed male with history of hypertension, diabetes mellitus, obesity with BMI 38.37, end-stage renal disease with hemodialysis Tuesday Thursday Saturday at [facility] and reported medical noncompliance with hypertensive medications.  Patient with a long complicated history.  Prior to patient's initial hospital admission he lived with his 35 year old father in [place] independent prior to admission.  1 level home with ramped entrance.  Presented 04/29/2020 from [facility] skilled nursing facility where he had been for an extended time for events from COVID-19 testing + 04/20/2020 and issues related to his gallbladder and essentially was bedbound developing a sacral decubitus as well as bilateral heel wounds.  On admission patient with increasing shortness of breath as well as low-grade fever 100.9.  Sacral ulcer did have a foul-smelling odor.  Admission chemistries lactic acid 0.9, blood cultures no growth to date, BUN 11, creatinine 4.41, WBC 5800, hemoglobin 7.8, pro calcitonin 1.24.  Chest x-ray showed diffuse bilateral airspace disease correlated with COVID-19 status.  MRI of the left foot area of ulceration with nailbed irregularity at the first digit with findings suggestive of osteomyelitis involving the first digit distal tuft.  No loculated fluid collection or sinus tract and right foot MRI abnormal osseous edema head of the fifth metatarsal suspicious for osteomyelitis.  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  Surgery was consulted in  regards to sacral and heel wounds and was receiving hydrotherapy.  Placed on vancomycin and cefepime to be ongoing through 06/10/2020.  Dr. Sharol Given was  consulted in regards to unstageable pressure wounds of left lateral foot lateral malleolus right lateral foot and heel dry gangrene of left fourth toe as well as left first metatarsal and right fifth meta tarsal advised Prevalon boots and would follow as outpatient.  Hemodialysis ongoing as per renal services monitoring closely for bouts of hypotension.  Subcutaneous heparin for DVT prophylaxis.  Due to patient's profound deconditioning multimedical issues he was admitted for a comprehensive rehabilitation program. Currently denies pain. AOx3.   Current Status:  Upon entering the room, the patient was lying in his bed but awake and alert.  Patient still very lethargic with flat affect.  Patient stated he remembered meeting with me before but was unable to remember her address what our prior visit was about.  Patient continued to describe frustration around extended hospital stay and hope that he is able to go home versus skilled nursing facility.  It is still unclear to the patient where his discharge [name] be to.  Patient denied significant depression but clearly is frustrated and is having difficulty coping and remaining motivated for continued improvement.  Behavioral Observation: [name]  presents as a 50 y.o.-year-old Right Caucasian Male who appeared his stated age. his dress was Appropriate and he was Well Groomed and his manners were Appropriate, inappropriate to the situation.  his participation was indicative of Drowsy and Inattentive behaviors.  There were physical disabilities noted.  he displayed an inappropriate level of cooperation and motivation.     Interactions:    Minimal Drowsy and Inattentive  Attention:   abnormal and attention span appeared shorter than expected for age  Memory:   within normal limits; recent and remote memory intact  Visuo-spatial:  not examined  Speech (Volume):  low  Speech:   normal; slowed responses  Thought Process:  Coherent and Relevant  Though  Content:  WNL; not suicidal and not homicidal  Orientation:   person, place, time/date and situation  Judgment:   Fair  Planning:   Poor  Affect:    Blunted, Depressed, Flat and Lethargic  Mood:    Dysphoric  Insight:   Fair  Intelligence:   normal  Medical History:   Past Medical History:  Diagnosis Date  . Chronic kidney disease    HD MWF  . Diabetes mellitus without complication ([place])   . Hypertension          Patient Active Problem List   Diagnosis Date Noted  . Anemia of chronic disease   . ESRD on dialysis ([place])   . Diabetic peripheral neuropathy ([place])   . Labile blood glucose   . Essential hypertension   . Constipation   . 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 Avascular necrosis of first metatarsal, left foot ([place]) 04/30/2020  . Osteomyelitis of dorsal first metatarsal, left foot ([place]) 04/30/2020  . Decubitus ulcer of sacral region, unstageable ([place]) 04/30/2020  . Morbid obesity ([place]) 04/30/2020  . Multiple open wounds of foot   . Insulin dependent type 2 diabetes mellitus ([place]) 04/29/2020  . HTN (hypertension) 04/29/2020  . History of anemia due to chronic kidney disease 04/29/2020  . 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 Pressure injury of both heels, unstageable ([place]) 04/29/2020  . Pneumonia due to COVID-19 virus 04/29/2020  . ESRD (end stage renal disease) ([place]) 04/29/2020    Psychiatric History:  No  prior psychiatric history noted.  Family Med/Psych History: History reviewed. No pertinent family history.  Impression/DX:  Duell Ciraulo is a 50 year old male with history of hypertension, diabetes, obesity, end-stage renal disease with hemodialysis.  There is reported medical noncompliance with hypertensive medication.  Patient has a very complicated medical history with significant illness.  Patient has been living with his 47 year old father independent prior to initial admission to outside hospital after having her gallbladder removed and had a  subsequent fall while in the hospital.  Patient remained in bed for extended period of time and was eventually transferred to skilled nursing..  Patient presented on 04/29/2020 from skilled nursing facility where he has been for an extended time for events prior and tested positive for COVID-19 on 04/20/2020.  Patient had had issues related to his gallbladder and essentially was bedbound developing multiple significant wound sites.  Patient had noted bilateral airspace disease correlated with COVID-19 status.  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  Patient referred for CIR due to profound deconditioning and multi medical issues.  Patient is now preliminarily scheduled for discharge on 2/14 and has been making some improvements but continues to be quite debilitated but is making progress in transfers from bed to wheelchair and able to stand and increasing length of time with study.  Upon entering the room, the patient was lying in his bed but awake and alert.  Patient still very lethargic with flat affect.  Patient stated he remembered meeting with me before but was unable to remember her address what our prior visit was about.  Patient continued to describe frustration around extended hospital stay and hope that he is able to go home versus skilled nursing facility.  It is still unclear to the patient where his discharge [name] be to.  Patient denied significant depression but clearly is frustrated and is having difficulty coping and remaining motivated for continued improvement.    Diagnosis:    Critical Illness myopathy      Electronically Signed   _______________________ Ilean Skill, Psy.D. Clinical Neuropsychologist

## 2020-06-28 NOTE — Progress Notes (Signed)
[facility] PHYSICAL MEDICINE & REHABILITATION PROGRESS NOTE   Subjective/Complaints:  Said had 7-8 BMs yesterday- all diarrhea- all bowel accidents.   Having diarrhea again.  Not a breakfast/morning person.  However chart showed mainly type 5 stools- not liquid stools.  Will give 1 dose of Imodium 4 mg x1.   All small stools- all incontinent.      ROS:   Pt denies SOB, abd pain, CP, N/V/C/D, and vision changes   Objective:   No results found. Recent Labs    06/26/20 1709 06/28/20 0537  WBC 8.1 9.3  HGB 10.0* 10.9*  HCT 33.7* 34.0*  PLT 286 330   Recent Labs    06/26/20 1709 06/28/20 0537  NA 132* 134*  K 4.7 4.2  CL 95* 99  CO2 26 25  GLUCOSE 180* 103*  BUN 52* 45*  CREATININE 6.58* 6.10*  CALCIUM 9.4 9.7    Intake/Output Summary (Last 24 hours) at 06/28/2020 0841 Last data filed at 06/27/2020 1300 Gross per 24 hour  Intake 240 ml  Output 100 ml  Net 140 ml     Pressure Injury 05/01/20 Buttocks Left Stage 4 - Full thickness tissue loss with exposed bone, tendon or muscle. packed with santyl gauze, dressed by WOCN, (Active)  05/01/20 0230  Location: Buttocks  Location Orientation: Left  Staging: Stage 4 - Full thickness tissue loss with exposed bone, tendon or muscle.  Wound Description (Comments): packed with santyl gauze, dressed by WOCN,  Present on Admission: Yes     Pressure Injury 05/01/20 Ankle Left;Lateral Unstageable - Full thickness tissue loss in which the base of the injury is covered by slough (yellow, tan, gray, green or brown) and/or eschar (tan, brown or black) in the wound bed. smal circular wound on the (Active)  05/01/20 0230  Location: Ankle  Location Orientation: Left;Lateral  Staging: Unstageable - Full thickness tissue loss in which the base of the injury is covered by slough (yellow, tan, gray, green or brown) and/or eschar (tan, brown or black) in the wound bed.  Wound Description (Comments): smal circular wound on the bone   Present on Admission: Yes     Pressure Injury 05/01/20 Foot Left;Posterior Unstageable - Full thickness tissue loss in which the base of the injury is covered by slough (yellow, tan, gray, green or brown) and/or eschar (tan, brown or black) in the wound bed. 4th and 5th toes appear n (Active)  05/01/20 0230  Location: Foot (4th and 5th toes and lateral/anterior side of the sole)  Location Orientation: Left;Posterior  Staging: Unstageable - Full thickness tissue loss in which the base of the injury is covered by slough (yellow, tan, gray, green or brown) and/or eschar (tan, brown or black) in the wound bed.  Wound Description (Comments): 4th and 5th toes appear necrotic circular wound on sole  Present on Admission: Yes     Pressure Injury 05/01/20 Foot Right;Medial;Posterior Unstageable - Full thickness tissue loss in which the base of the injury is covered by slough (yellow, tan, gray, green or brown) and/or eschar (tan, brown or black) in the wound bed. large circular wo (Active)  05/01/20 0230  Location: Foot  Location Orientation: Right;Medial;Posterior  Staging: Unstageable - Full thickness tissue loss in which the base of the injury is covered by slough (yellow, tan, gray, green or brown) and/or eschar (tan, brown or black) in the wound bed.  Wound Description (Comments): large circular wound on sole  and heel  Present on Admission: Yes  Pressure Injury 05/08/20 Heel Right Unstageable - Full thickness tissue loss in which the base of the injury is covered by slough (yellow, tan, gray, green or brown) and/or eschar (tan, brown or black) in the wound bed. (Active)  05/08/20 1718  Location: Heel  Location Orientation: Right  Staging: Unstageable - Full thickness tissue loss in which the base of the injury is covered by slough (yellow, tan, gray, green or brown) and/or eschar (tan, brown or black) in the wound bed.  Wound Description (Comments):   Present on Admission: Yes     Physical Exam: Vital Signs Blood pressure (!) 128/91, pulse 90, temperature 98.4 F (36.9 C), temperature source Oral, resp. rate 18, height _0  (1.981 m), weight (!) 153.8 kg, SpO2 97 %. Constitutional: awake, alert, appropriate, sleepy, NAD HENT: Normocephalic.  Atraumatic. Eyes: EOMI. No discharge. Cardiovascular: RRR Respiratory: CTA B/L- no W/R/R- good air movement GI: Soft, NT, ND, (+)BS  Skin: Warm and dry.  Intact. Pressure ulcers not examined today Psych: Flat.  Disengaged.flat- not interactive Extremities: edema in extremities.  No tenderness in extremities. Neuro: Alert Motor: Bilateral upper extremities: 5/5 proximal distal Bilateral lower extremities: 4 -/5 proximal distal (?  Participation)  Assessment/Plan: 1. Functional deficits which require 3+ hours per day of interdisciplinary therapy in a comprehensive inpatient rehab setting.  Physiatrist is providing close team supervision and 24 hour management of active medical problems listed below.  Physiatrist and rehab team continue to assess barriers to discharge/monitor patient progress toward functional and medical goals  Care Tool:  Bathing    Body parts bathed by patient: Right arm,Left arm,Chest,Abdomen,Right upper leg,Left upper leg,Face   Body parts bathed by helper: Right lower leg,Left lower leg,Buttocks     Bathing assist Assist Level: Minimal Assistance - Patient > 75% (declined LB today, EOB level)     Upper Body Dressing/Undressing Upper body dressing   What is the patient wearing?: Pull over shirt    Upper body assist Assist Level: Set up assist    Lower Body Dressing/Undressing Lower body dressing      What is the patient wearing?: Pants     Lower body assist Assist for lower body dressing: Set up assist     Toileting Toileting    Toileting assist Assist for toileting: Maximal Assistance - Patient 25 - 49% Assistive Device Comment:  (urinal)   Transfers Chair/bed  transfer  Transfers assist  Chair/bed transfer activity did not occur: Safety/medical concerns (Per nursing report)  Chair/bed transfer assist level: Dependent - mechanical lift Chair/bed transfer assistive device:  (Stedy)   Locomotion Ambulation   Ambulation assist   Ambulation activity did not occur: Safety/medical concerns (fatigue, lightheadedness, generalized weakness)  Assist level: Minimal Assistance - Patient > 75% Assistive device: Parallel bars Max distance: 24 ft   Walk 10 feet activity   Assist  Walk 10 feet activity did not occur: Safety/medical concerns (fatigue, lightheadedness, generalized weakness)  Assist level: Minimal Assistance - Patient > 75% Assistive device: Parallel bars   Walk 50 feet activity   Assist Walk 50 feet with 2 turns activity did not occur: Safety/medical concerns (fatigue, lightheadedness, generalized weakness)         Walk 150 feet activity   Assist Walk 150 feet activity did not occur: Safety/medical concerns (fatigue, lightheadedness, generalized weakness)         Walk 10 feet on uneven surface  activity   Assist Walk 10 feet on uneven surfaces activity did not occur: Safety/medical concerns (fatigue, lightheadedness,  generalized weakness)         Wheelchair     Assist Will patient use wheelchair at discharge?: Yes Type of Wheelchair: Manual    Wheelchair assist level: Supervision/Verbal cueing Max wheelchair distance: 100    Wheelchair 50 feet with 2 turns activity    Assist        Assist Level: Supervision/Verbal cueing   Wheelchair 150 feet activity     Assist      Assist Level: Minimal Assistance - Patient > 75%   Blood pressure (!) 128/91, pulse 90, temperature 98.4 F (36.9 C), temperature source Oral, resp. rate 18, height _0  (1.981 m), weight (!) 153.8 kg, SpO2 97 %.  Medical Problem List and Plan: 1.  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  Antithrombotics: -DVT/anticoagulation: Subcutaneous heparin.               -antiplatelet therapy: N/A 3. Pain Management: Tylenol as needed  2/7- pain controlled con't regimen 4. Mood: Provide emotional support             -antipsychotic agents: N/A 5. Neuropsych: This patient is? capable of making decisions on his own behalf. 6. Skin/Wound Care: Routine skin checks 7. Fluids/Electrolytes/Nutrition: Routine in and outs with follow-up chemistries 8.  ESRD.  Hemodialysis per renal services 9.  Sacral decubitus ulcer as well as ischemic ulcers on the left foot fourth toe and fifth metatarsal head and right heel and fifth metatarsal head.  Wound care nurse follow-up.  Patient to see Dr. Sharol Given outpatient.  Continue bilateral PRAFO's. 10.  ID/ coccygeal osteomyelitis.    Completed vancomycin and cefepime on 06/10/2020  1/18- will schedule turning q2 hours to get off wounds- also will make sure on specialty bed  1/20- is on specialty bed  Repeat labs ordered for Monday  2/7- CRP 6.5- ESR pending - con't regimen 10.  Anemia of chronic disease.  Continue Aranesp.   Hemoglobin 10.0 on 2/3, labs with HD 11.  Diabetes mellitus with peripheral neuropathy .  Hemoglobin A1c 5.6.  Lantus insulin 11 units nightly  CBG (last 3)  Recent Labs    06/27/20 1726 06/27/20 2102 06/28/20 0637  GLUCAP 138* 133* 106*    Slightly labile on 2/5  2/7- good BG control- con't regimen 12.  Morbid obesity.  BMI 38.37.  Dietary follow-up 13. Dizziness/nausea with HTN and hypotension  Vitals:   06/27/20 1943 06/28/20 0520  BP: (!) 151/95 (!) 128/91  Pulse: 100 90  Resp: 16 18  Temp: 98.9 F (37.2 C) 98.4 F (36.9 C)  SpO2: 100% 97%    Hydralazine prn   Norvasc to 7.5 mg daily   2/7- BP controlled overall- con't regimen 14. Diarrhea with new constipation  Bowel meds increased on 2/5  2/7- will decrease bowel meds again since 8+ BMs in last 24 hours-  15.  Leukocytosis: Resolved  ESR/CRP and CBC with diff ordered for Monday.  2/7- CRP 6.5- stable- con't regimen     LOS: 22 days A FACE TO FACE EVALUATION WAS PERFORMED  Coleson Kant 06/28/2020, 8:41 AM

## 2020-06-29 ENCOUNTER — Inpatient Hospital Stay (HOSPITAL_COMMUNITY): Payer: Medicare Other

## 2020-06-29 LAB — CBC
HCT: 34.5 % — ABNORMAL LOW (ref 39.0–52.0)
Hemoglobin: 10.5 g/dL — ABNORMAL LOW (ref 13.0–17.0)
MCH: 25.5 pg — ABNORMAL LOW (ref 26.0–34.0)
MCHC: 30.4 g/dL (ref 30.0–36.0)
MCV: 83.9 fL (ref 80.0–100.0)
Platelets: 323 10*3/uL (ref 150–400)
RBC: 4.11 MIL/uL — ABNORMAL LOW (ref 4.22–5.81)
RDW: 15.9 % — ABNORMAL HIGH (ref 11.5–15.5)
WBC: 10.9 10*3/uL — ABNORMAL HIGH (ref 4.0–10.5)
nRBC: 0 % (ref 0.0–0.2)

## 2020-06-29 LAB — RENAL FUNCTION PANEL
Albumin: 2.3 g/dL — ABNORMAL LOW (ref 3.5–5.0)
Anion gap: 12 (ref 5–15)
BUN: 63 mg/dL — ABNORMAL HIGH (ref 6–20)
CO2: 25 mmol/L (ref 22–32)
Calcium: 9.9 mg/dL (ref 8.9–10.3)
Chloride: 96 mmol/L — ABNORMAL LOW (ref 98–111)
Creatinine, Ser: 7.51 mg/dL — ABNORMAL HIGH (ref 0.61–1.24)
GFR, Estimated: 8 mL/min — ABNORMAL LOW (ref >60)
Glucose, Bld: 119 mg/dL — ABNORMAL HIGH (ref 70–99)
Phosphorus: 4.7 mg/dL — ABNORMAL HIGH (ref 2.5–4.6)
Potassium: 4.8 mmol/L (ref 3.5–5.1)
Sodium: 133 mmol/L — ABNORMAL LOW (ref 135–145)

## 2020-06-29 LAB — GLUCOSE, CAPILLARY
Glucose-Capillary: 101 mg/dL — ABNORMAL HIGH (ref 70–99)
Glucose-Capillary: 120 mg/dL — ABNORMAL HIGH (ref 70–99)
Glucose-Capillary: 109 mg/dL — ABNORMAL HIGH (ref 70–99)
Glucose-Capillary: 146 mg/dL — ABNORMAL HIGH (ref 70–99)

## 2020-06-29 MED ORDER — VANCOMYCIN HCL 2000 MG/400ML IV SOLN
2000.0000 mg | Freq: Once | INTRAVENOUS | Status: DC
  Filled 2020-06-29: qty 400

## 2020-06-29 MED ORDER — HEPARIN SODIUM (PORCINE) 1000 UNIT/ML IJ SOLN
INTRAMUSCULAR | Status: AC
  Filled 2020-06-29: qty 7

## 2020-06-29 MED ORDER — VANCOMYCIN HCL IN DEXTROSE 1-5 GM/200ML-% IV SOLN
1000.0000 mg | INTRAVENOUS | Status: DC
  Administered 2020-07-01: 1000 mg via INTRAVENOUS
  Filled 2020-06-29: qty 200

## 2020-06-29 MED ORDER — DARBEPOETIN ALFA 200 MCG/0.4ML IJ SOSY
PREFILLED_SYRINGE | INTRAMUSCULAR | Status: AC
  Filled 2020-06-29: qty 0.4

## 2020-06-29 MED ORDER — SODIUM CHLORIDE 0.9 % IV SOLN
2.0000 g | Freq: Once | INTRAVENOUS | Status: AC
  Administered 2020-06-30: 2 g via INTRAVENOUS
  Filled 2020-06-29: qty 2

## 2020-06-29 MED ORDER — SODIUM CHLORIDE 0.9 % IV SOLN
2.0000 g | INTRAVENOUS | Status: DC
  Administered 2020-07-01: 2 g via INTRAVENOUS
  Filled 2020-06-29 (×3): qty 2

## 2020-06-29 NOTE — Progress Notes (Signed)
Pt down to dialysis at this time per bed.  No complications noted. Sheela Stack, LPN

## 2020-06-29 NOTE — Patient Care Conference (Signed)
Inpatient RehabilitationTeam Conference and Plan of Care Update Date: 06/29/2020   Time: 11:39 AM    Patient Name: Sabastien [name]      Medical Record Number: [id number]  Date of Birth: Feb 11, 1971 Sex: Male         Room/Bed: 4M05C/4M05C-01 Payor Info: Payor: MEDICARE / Plan: MEDICARE PART A AND B / Product Type: *No Product type* /    Admit Date/Time:  06/06/2020  1:05 PM  Primary Diagnosis:  Critical illness myopathy  Hospital Problems: Principal Problem:   Critical illness myopathy Active Problems:   Anemia of chronic disease   ESRD on dialysis ([place])   Diabetic peripheral neuropathy ([place])   Labile blood glucose   Essential hypertension   Constipation    Expected Discharge Date: Expected Discharge Date: 07/05/20  Team Members Present: Physician leading conference: Dr. Courtney Heys Care Coodinator Present: Dorthula Nettles, RN, BSN, CRRN;Becky Dupree, LCSW Nurse Present: Annita Brod, LPN PT Present: Stacy Gardner, PT OT Present: Lillia Corporal, OT PPS Coordinator present : Gunnar Fusi, SLP     Current Status/Progress Goal Weekly Team Focus  Bowel/Bladder   Pt is Oliguriic LBM 06/27/20, occassion episodes of incontinence,  Remain continent and prevent constipation/diarrhea  QS/PRN assessment   Swallow/Nutrition/ Hydration             ADL's   (based on CARE tool) set up/S LB dress bed level, toileting Max A, UB ADLs set up/S at EOB, LB bathing Mod overall, sit to stands Min-Mod of 1, now performing Stedy transfers  CS/Mod I w/c level  general conditioning, OOB tolerance, functional transfers with wound awareness, ADL retraining, DC planning   Mobility   SBA-CGA bed mob; CGA SB transfers; mod A - max A transfers with RW; gait 7 steps RW mod A  mod I WC level, increased tolerance OOB in sitting upright; CGA standing and gait  increased tolerance OOB and standing tolerance; strengthening; transfer training; gait training; WC mobility   Communication              Safety/Cognition/ Behavioral Observations            Pain   Denies pain  Remain free of pain.  QS.PRN asessmenr   Skin   Stage 4 to sacral decubitus ulcer, Ulcers to BLE right/left heel and metetarsals  Prevent further skin breakdown and skin infection  QS/PRN assessment continue current treatments with dressing changes. pressure redusing mattress in place, repostioning     Discharge Planning:  Family could not get in Monday due to weather, coming wednesday for education and se eif can be managed at home with 72 yo Dad and sister helping. Does not want to go back to NH   Team Discussion: Patient complains of rehab "kicking him out." Had 8 bowel movements on Monday, decreased bowels medications which were increased over the weekend. Patient goes from hard stools to loose stools. Needs to be on a bowel program but patient is refusing. 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 BMI down from 38.37 to 30.42. Continent with incontinent episodes. Refuses Reglan because he feels causing diarrhea. Stage 4 sacrum BID dressing changes, bilateral feet dressing changes, left foot ulcer ruptured with therapy-redressed wound. Complains of dry cough, lungs clear, no production with cough. Family coming in tomorrow for family education.  Patient on target to meet rehab goals: Needs encouragement to participate, still claims we are kicking him out. Explained that is not the case. Lower body bathing and dressing at bed level is min-mod  assist. Can't get up from lower surface on BSC. Doing slide board transfers with a bump method due to bottom. Seems down, refused to stand. Very fatigued, progressed to gait training yesterday and took 7 steps with the RW and mod assist. Family meeting after therapy tomorrow.  *See Care Plan and progress notes for long and short-term goals.   Revisions to Treatment Plan:  MD making changes to bowel medications.  Teaching Needs: Family education, medication management,  Transfer training, balance training, endurance training, gait training, wound/skin care  Current Barriers to Discharge: Inaccessible home environment, Decreased caregiver support, Home enviroment access/layout, Incontinence, Neurogenic bowel and bladder, Wound care, Lack of/limited family support, Weight, Hemodialysis, Medication compliance and Behavior  Possible Resolutions to Barriers: Continue current medications, educate wound/skin care, educate bowel program, provide emotional support to patient and family.     Medical Summary Current Status: new dry cough- continent B/B- LBM yesterday- occ bowel incontinence- like Monday- flutuating constipation/diarrhea. refusing reglan- "gives diarrhea". Stage IV sacrum- wet-dry BID; foot ulcers B/L- betadine-ulcer started bleeding/ruptured? with standing. L foot;  Barriers to Discharge: Decreased family/caregiver support;Insurance for SNF coverage;Medical stability;Medication compliance;Hemodialysis;Incontinence;Home enviroment access/layout;Weight;Wound care  Barriers to Discharge Comments: SW_ going to SNF? family coming for family education; can't feel when goes to bathroom/bowel program needed- pt refused. won't stay off wounds in bed- always supine- Possible Resolutions to [employer] Focus: OT- back and forth- needs encouragement to do therapy - LB ADLs bed level- min-mod A; toileting-cannot get up from [facility]- low surface; transfer board; PT- wouldn't do PT today- "too tired"- fell asleep; did gait training yesterday 7 step with RW mod A;  d/c 2/14??? family ed tomorrow? vs SNF   Continued Need for Acute Rehabilitation Level of Care: The patient requires daily medical management by a physician with specialized training in physical medicine and rehabilitation for the following reasons: Direction of a multidisciplinary physical rehabilitation program to maximize functional independence : Yes Medical management of patient stability for increased  activity during participation in an intensive rehabilitation regime.: Yes Analysis of laboratory values and/or radiology reports with any subsequent need for medication adjustment and/or medical intervention. : Yes   I attest that I was present, lead the team conference, and concur with the assessment and plan of the team.   Cristi Loron 06/29/2020, 1:08 PM

## 2020-06-29 NOTE — Progress Notes (Addendum)
Notified on call Algis Liming, PA informed of current VS with Temp. 100.9 orally,see additional VS, new orders received and carried out, patient denies pain or discomfort. MEWS yellow..protocol initiated

## 2020-06-29 NOTE — Progress Notes (Signed)
R with minimal atelectasis. Labs ordered earlier still not drawn. Nurse contacted to recheck vitals and call labs for blood draw. Has been tachy this evening with elevated temp on return from HD. Did get tylenol for T-100.9 earlier and recheck T-98.3. CBC today with elevation in WBC. 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 Will resume antibiotics empirically pending cultures/labs. Recheck CBC w/diff in am.

## 2020-06-29 NOTE — Progress Notes (Signed)
Occupational Therapy Session Note  [name] MRN: [id number] Date of Birth: [birth year]  Today's Date: 06/29/2020 OT Individual Time: 1000-1023 OT Individual Time Calculation (min): 23 min   Short Term Goals: Week 3:  OT Short Term Goal 1 (Week 3): STGs=LTGs due to ELOS  Skilled Therapeutic Interventions/Progress Updates:    Pt greeted in bed with lights off and TV on. Pt declined any pain, but reports being too tired to get OOB or try to stand. Pt only agreeable to bed level UB there-ex. Pt completed 3 sets of 15 chest press, bicep curl, and triceps press using 3 lb dowel rod with extended rest breaks in between. Pt declined further activity. Pt left supine in bed with needs met.   Therapy Documentation Precautions:  Precautions Precautions: Fall Precaution Comments: wounds on bilat feet and L sacrum, monitor orthostatic BPs Required Braces or Orthoses: Other Brace Other Brace: abdominal binder Restrictions Weight Bearing Restrictions: No Pain:  denies pain  Therapy/Group: Individual Therapy  Valma Cava 06/29/2020, 10:08 AM

## 2020-06-29 NOTE — Progress Notes (Signed)
Report given to dialysis nurse Sheela Stack, LPN

## 2020-06-29 NOTE — Progress Notes (Signed)
Occupational Therapy Session Note  [name] MRN: [id number] Date of Birth: 05/17/1971  Today's Date: 06/29/2020 OT Individual Time: 0730-0824 and 1101-1121 OT Individual Time Calculation (min): 54 min and 20 min Missed 10 mins d/t patient's LLE bleeding after standing, nursing care taking over care   Short Term Goals: Week 2:  OT Short Term Goal 1 (Week 2): Pt will tolerate sitting OOB for 2-3 hours OT Short Term Goal 1 - Progress (Week 2): Progressing toward goal OT Short Term Goal 2 (Week 2): Pt will complete LB bathing and dressing with Min A w/ AE PRN OT Short Term Goal 2 - Progress (Week 2): Met OT Short Term Goal 3 (Week 2): Pt will complete transfer to BSC with LRAD with Min A OT Short Term Goal 3 - Progress (Week 2): Progressing toward goal OT Short Term Goal 4 (Week 2): Pt will perform sit to stand at Stedy with Max A of 1 consistently OT Short Term Goal 4 - Progress (Week 2): Met Week 3:  OT Short Term Goal 1 (Week 3): STGs=LTGs due to ELOS   Skilled Therapeutic Interventions/Progress Updates:    Pt greeted at time of session supine in bed sleeping but aroused with verbal stimuli and turning on lights. Agreeable to OT session with encouragement, saying he feels like he can barely keep eyes open today. Supine > sit CGA, nursing present to get weight. Sit > stand at scale initially with 1 assist but unable, with 2 helpers able to sit to stand at scale, able to static stand approx 30 seconds for scale reading and could have stood longer. Slide board bed > w/c with Min/CGA with cues for anterior weight shift. Self propel to sink, performed grooming and oral hygiene Mod I. Pt very fatigued at this time and declined gym, exercises, etc. Slide board back to bed Min A with pt going into side lying quickly d/t fatigue, did not wait for therapist to remove board or position wheelchair. Supine full body overhead stretches and tricep stretch with 1x5 with 5 second  hold. Pt left resting with call bell in reach all needs met.    Session 2: Pt greeted at time of session supine in bed resting, sleepy but agreeable to OT session. Discussion at beginning of session regarding DC planning for SNF and importance of continuing therapy for max potential. Pt agreeable to trial standing today, supine > sit CGA with bed features. Declined binder/ace saying he did not feel like he needed them. Sit > stand from elevated bed surface w/ Mod/Max of 1 at bariwalker with foot blocking as he insists on pulling up at RW. At this time, pt standing approx 15 seconds before noting blood on the floor around LLE. Returned to supine CGA. Nursing notified and addressed bleeding L foot wound. Hand off to nursing. Missed 10 minutes d/t bleeding foot.   Therapy Documentation Precautions:  Precautions Precautions: Fall Precaution Comments: wounds on bilat feet and L sacrum, monitor orthostatic BPs Required Braces or Orthoses: Other Brace Other Brace: abdominal binder Restrictions Weight Bearing Restrictions: No     Therapy/Group: Individual Therapy   C  06/29/2020, 8:25 AM  

## 2020-06-29 NOTE — Progress Notes (Addendum)
Patient ID: Fred [name], male   DOB: 02/22/1971, 49 y.o.   MRN: [id number] Met with pt to discuss team conference progress and discharge still 2/14. Discussed he needs to push himself even though he may be tired. His 80 yo Dad can not physically assist him. He is aware of this and knows. Sister and Dad coming tomorrow to attend therapies and see level of care he is. Will discuss best option for pt then. May need to go back to NH until can go home with Dad.  2:18 Pm Spoke with [facility] rehab to ask if could send information for consideration for admission. Have faxed information to her and will await response. Facility pt, sister and Dad prefer and close to their home  

## 2020-06-29 NOTE — Progress Notes (Signed)
Patient request CHG to be done at later time ,after writer explained the  purpose of treatment

## 2020-06-29 NOTE — Progress Notes (Signed)
Pharmacy Antibiotic Note  Fred [name] is a 49 y.o. male admitted on 06/06/2020 with osteomyelitis.  Pharmacy has been consulted to restart Vancomycin and Cefepime. Noted pt finished 6 weeks of Vanc and Cefepime on 06/10/20. Pt is HD pt (T/T/S)  Plan: Cefepime 2gm IV qHD Vancomycin '2000mg'$  IV now then 1000 mg IV qHD Will f/u HD schedule/tolerance, micro data, and pt's clinical condition Vanc levels prn   Height: '6\' 6"'$  (198.1 cm) Weight: 119 kg (262 lb 5.6 oz) IBW/kg (Calculated) : 91.4  Temp (24hrs), Avg:98.7 F (37.1 C), Min:97.6 F (36.4 C), Max:100.9 F (38.3 C)  Recent Labs  Lab 06/23/20 0559 06/24/20 1314 06/26/20 1709 06/28/20 0537 06/29/20 1645  WBC 9.8 9.9 8.1 9.3 10.9*  CREATININE 5.61* 7.62* 6.58* 6.10* 7.51*    Estimated Creatinine Clearance: 17.2 mL/min (A) (by C-G formula based on SCr of 7.51 mg/dL (H)).    Allergies  Allergen Reactions  . Atorvastatin Hives    NOT on MAR    Antimicrobials this admission: 12/19 Vanc >> (1/20) 12/9 Cefepime >> (1/20) 12/9 Remdesivir >> 12/14  Microbiology results: 12/9 BCx: negativeF 12/9 COVID: positive 2/8 BCx:   Thank you for allowing pharmacy to be a part of this patient's care.  Sherlon Handing, PharmD, BCPS Please see amion for complete clinical pharmacist phone list 06/29/2020 10:32 PM

## 2020-06-29 NOTE — Progress Notes (Addendum)
Notified by on call Algis Liming, PA, informed of current VS, wiill f/u with lab on previous orders for blood draw, also will obtain wound culture per order from on call.  2232 Spoke with Lab technician in main lab, unable to reach Phelbotomy after numerous attempts   2240 Informed on call of difficulty in reaching Lab regaining blood draw,   2046 Reached lab technician, states patient was off department in xray and will be right up to obtain labs  2253 Lab technician on department and specimen obtained. Late entry  Patient refuses PRAFO's boots.

## 2020-06-29 NOTE — Progress Notes (Signed)
Patient returned via bed with transporter to his room, Alert and cooperative, denies pain at this time

## 2020-06-29 NOTE — Progress Notes (Signed)
Patient refuse bilateral PRAFO boots, explain purpose and importance of wearing the boots, continue to refuse states t"hey are too hot and I don'r want them on"

## 2020-06-29 NOTE — Progress Notes (Addendum)
New nonproductive cough onset last night 2/7. Lungs clear/diminished bilaterally. Muscle aches in legs, denies chills, no fever. PA Dan notified.  1130: MD [place] notified during team conference Fred Stack, LPN

## 2020-06-29 NOTE — Procedures (Signed)
   I was present at this dialysis session, have reviewed the session itself and made  appropriate changes Kelly Splinter MD [place] pager [phone]   06/29/2020, 4:52 PM

## 2020-06-29 NOTE — Progress Notes (Signed)
[facility] PHYSICAL MEDICINE & REHABILITATION PROGRESS NOTE   Subjective/Complaints:  Down to 263.2 lbs- based on different scale.  Saw him weighed myself on standing scale.   Upset because says "we are kicking him out" explained 1. We are an acute rehab- which means no longer than 4 weeks; 2. Average length of stay is <2 weeks; 3. Insurance makes the call.   No other issues.   ROS:   Pt denies SOB, abd pain, CP, N/V/C/D, and vision changes   Objective:   No results found. Recent Labs    06/26/20 1709 06/28/20 0537  WBC 8.1 9.3  HGB 10.0* 10.9*  HCT 33.7* 34.0*  PLT 286 330   Recent Labs    06/26/20 1709 06/28/20 0537  NA 132* 134*  K 4.7 4.2  CL 95* 99  CO2 26 25  GLUCOSE 180* 103*  BUN 52* 45*  CREATININE 6.58* 6.10*  CALCIUM 9.4 9.7    Intake/Output Summary (Last 24 hours) at 06/29/2020 0846 Last data filed at 06/28/2020 1839 Gross per 24 hour  Intake 320 ml  Output 700 ml  Net -380 ml     Pressure Injury 05/01/20 Buttocks Left Stage 4 - Full thickness tissue loss with exposed bone, tendon or muscle. packed with santyl gauze, dressed by WOCN, (Active)  05/01/20 0230  Location: Buttocks  Location Orientation: Left  Staging: Stage 4 - Full thickness tissue loss with exposed bone, tendon or muscle.  Wound Description (Comments): packed with santyl gauze, dressed by WOCN,  Present on Admission: Yes     Pressure Injury 05/01/20 Ankle Left;Lateral Unstageable - Full thickness tissue loss in which the base of the injury is covered by slough (yellow, tan, gray, green or brown) and/or eschar (tan, brown or black) in the wound bed. smal circular wound on the (Active)  05/01/20 0230  Location: Ankle  Location Orientation: Left;Lateral  Staging: Unstageable - Full thickness tissue loss in which the base of the injury is covered by slough (yellow, tan, gray, green or brown) and/or eschar (tan, brown or black) in the wound bed.  Wound Description (Comments): smal  circular wound on the bone  Present on Admission: Yes     Pressure Injury 05/01/20 Foot Left;Posterior Unstageable - Full thickness tissue loss in which the base of the injury is covered by slough (yellow, tan, gray, green or brown) and/or eschar (tan, brown or black) in the wound bed. 4th and 5th toes appear n (Active)  05/01/20 0230  Location: Foot (4th and 5th toes and lateral/anterior side of the sole)  Location Orientation: Left;Posterior  Staging: Unstageable - Full thickness tissue loss in which the base of the injury is covered by slough (yellow, tan, gray, green or brown) and/or eschar (tan, brown or black) in the wound bed.  Wound Description (Comments): 4th and 5th toes appear necrotic circular wound on sole  Present on Admission: Yes     Pressure Injury 05/01/20 Foot Right;Medial;Posterior Unstageable - Full thickness tissue loss in which the base of the injury is covered by slough (yellow, tan, gray, green or brown) and/or eschar (tan, brown or black) in the wound bed. large circular wo (Active)  05/01/20 0230  Location: Foot  Location Orientation: Right;Medial;Posterior  Staging: Unstageable - Full thickness tissue loss in which the base of the injury is covered by slough (yellow, tan, gray, green or brown) and/or eschar (tan, brown or black) in the wound bed.  Wound Description (Comments): large circular wound on sole  and heel  Present on Admission: Yes     Pressure Injury 05/08/20 Heel Right Unstageable - Full thickness tissue loss in which the base of the injury is covered by slough (yellow, tan, gray, green or brown) and/or eschar (tan, brown or black) in the wound bed. (Active)  05/08/20 1718  Location: Heel  Location Orientation: Right  Staging: Unstageable - Full thickness tissue loss in which the base of the injury is covered by slough (yellow, tan, gray, green or brown) and/or eschar (tan, brown or black) in the wound bed.  Wound Description (Comments):   Present on  Admission: Yes    Physical Exam: Vital Signs Blood pressure 121/61, pulse 98, temperature 98.3 F (36.8 C), temperature source Oral, resp. rate 19, height _0  (1.981 m), weight (!) 153.8 kg, SpO2 97 %. Constitutional: awake, sitting EOB, stood for weighing, NAD HENT: Normocephalic.  Atraumatic. Eyes: EOMI. No discharge. Cardiovascular: RRR- no JVD Respiratory: CTA B/L- no W/R/R- good air movement GI: Soft, NT, ND, (+)BS  Skin: pressure ulcers not examined since working with therapy Psych: Flat.  Disengaged.flat-unhappy Extremities: edema in extremities.  No tenderness in extremities. Neuro: Alert Motor: Bilateral upper extremities: 5/5 proximal distal Bilateral lower extremities: 4 -/5 proximal distal (?  Participation)  Assessment/Plan: 1. Functional deficits which require 3+ hours per day of interdisciplinary therapy in a comprehensive inpatient rehab setting.  Physiatrist is providing close team supervision and 24 hour management of active medical problems listed below.  Physiatrist and rehab team continue to assess barriers to discharge/monitor patient progress toward functional and medical goals  Care Tool:  Bathing    Body parts bathed by patient: Right arm,Left arm,Chest,Abdomen,Right upper leg,Left upper leg,Face   Body parts bathed by helper: Right lower leg,Left lower leg,Buttocks     Bathing assist Assist Level: Minimal Assistance - Patient > 75% (declined LB today, EOB level)     Upper Body Dressing/Undressing Upper body dressing   What is the patient wearing?: Pull over shirt    Upper body assist Assist Level: Set up assist    Lower Body Dressing/Undressing Lower body dressing      What is the patient wearing?: Pants     Lower body assist Assist for lower body dressing: Set up assist     Toileting Toileting    Toileting assist Assist for toileting: Maximal Assistance - Patient 25 - 49% Assistive Device Comment:  (urinal)    Transfers Chair/bed transfer  Transfers assist  Chair/bed transfer activity did not occur: Safety/medical concerns (Per nursing report)  Chair/bed transfer assist level: Minimal Assistance - Patient > 75% Chair/bed transfer assistive device: Sliding board   Locomotion Ambulation   Ambulation assist   Ambulation activity did not occur: Safety/medical concerns (fatigue, lightheadedness, generalized weakness)  Assist level: Minimal Assistance - Patient > 75% Assistive device: Parallel bars Max distance: 24 ft   Walk 10 feet activity   Assist  Walk 10 feet activity did not occur: Safety/medical concerns (fatigue, lightheadedness, generalized weakness)  Assist level: Minimal Assistance - Patient > 75% Assistive device: Parallel bars   Walk 50 feet activity   Assist Walk 50 feet with 2 turns activity did not occur: Safety/medical concerns (fatigue, lightheadedness, generalized weakness)         Walk 150 feet activity   Assist Walk 150 feet activity did not occur: Safety/medical concerns (fatigue, lightheadedness, generalized weakness)         Walk 10 feet on uneven surface  activity   Assist Walk 10 feet on uneven  surfaces activity did not occur: Safety/medical concerns (fatigue, lightheadedness, generalized weakness)         Wheelchair     Assist Will patient use wheelchair at discharge?: Yes Type of Wheelchair: Manual    Wheelchair assist level: Supervision/Verbal cueing Max wheelchair distance: 100    Wheelchair 50 feet with 2 turns activity    Assist        Assist Level: Supervision/Verbal cueing   Wheelchair 150 feet activity     Assist      Assist Level: Minimal Assistance - Patient > 75%   Blood pressure 121/61, pulse 98, temperature 98.3 F (36.8 C), temperature source Oral, resp. rate 19, height _0  (1.981 m), weight (!) 153.8 kg, SpO2 97 %.  Medical Problem List and Plan: 1.  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  Antithrombotics: -DVT/anticoagulation: Subcutaneous heparin.               -antiplatelet therapy: N/A 3. Pain Management: Tylenol as needed  2/8- pain controlled- con't regimen 4. Mood: Provide emotional support             -antipsychotic agents: N/A 5. Neuropsych: This patient is? capable of making decisions on his own behalf. 6. Skin/Wound Care: Routine skin checks 7. Fluids/Electrolytes/Nutrition: Routine in and outs with follow-up chemistries 8.  ESRD.  Hemodialysis per renal services 9.  Sacral decubitus ulcer as well as ischemic ulcers on the left foot fourth toe and fifth metatarsal head and right heel and fifth metatarsal head.  Wound care nurse follow-up.  Patient to see Dr. Sharol Given outpatient.  Continue bilateral PRAFO's. 10.  ID/ coccygeal osteomyelitis.    Completed vancomycin and cefepime on 06/10/2020  1/18- will schedule turning q2 hours to get off wounds- also will make sure on specialty bed  1/20- is on specialty bed  Repeat labs ordered for Monday  2/7- CRP 6.5- ESR pending - con't regimen  2/8- ESR  DOWN TO 67 FROM 73- SO HEADING IN RIGHT DIRECTION 10.  Anemia of chronic disease.  Continue Aranesp.   Hemoglobin 10.0 on 2/3, labs with HD 11.  Diabetes mellitus with peripheral neuropathy .  Hemoglobin A1c 5.6.  Lantus insulin 11 units nightly  CBG (last 3)  Recent Labs    06/28/20 1636 06/28/20 2142 06/29/20 0613  GLUCAP 132* 142* 101*    2/8- BGs 101-142- con't regimen 12.  Morbid obesity.  BMI 38.37.  Dietary follow-up 13. Dizziness/nausea with HTN and hypotension  Vitals:   06/28/20 2141 06/29/20 0517  BP: 124/87 121/61  Pulse: 100 98  Resp: 16 19  Temp: 99 F (37.2 C) 98.3 F (36.8 C)  SpO2: 99% 97%    Hydralazine prn   Norvasc to 7.5 mg daily   2/7- BP controlled overall- con't regimen 14. Diarrhea with new constipation  Bowel meds increased on 2/5  2/7- will decrease bowel meds again since 8+ BMs  in last 24 hours-  15. Leukocytosis: Resolved  ESR/CRP and CBC with diff ordered for Monday.  2/7- CRP 6.5- stable- con't regimen   2/8- EST 67- down to 73- con't regimen 16. Dispo  2/8- pt unhappy that has to back to SNF- however has been participating intermittently- finally doing more/more interactive, but we can only keep max of 30 days. Sent FL2    LOS: 23 days A FACE TO FACE EVALUATION WAS PERFORMED  Jaedynn Bohlken 06/29/2020, 8:46 AM

## 2020-06-29 NOTE — Progress Notes (Signed)
Patient transfer by transport radiology staff to Xray per orders, denies pain no acute distress or discomfort   2024 Patient returned from xray,denies pain/discomfort, patient refuse Prafo boots

## 2020-06-29 NOTE — Progress Notes (Signed)
Physical Therapy Session Note  [name] MRN: [id number] Date of Birth: [birth year]  Today's Date: 06/29/2020 PT Individual Time: 0900-0949 PT Individual Time Calculation (min): 49 min   Short Term Goals: Week 1:  PT Short Term Goal 1 (Week 1): pt will perform all bed mobility with CGA PT Short Term Goal 1 - Progress (Week 1): Not met PT Short Term Goal 2 (Week 1): Pt will transfer sit<>stand with LRAD and mod A PT Short Term Goal 2 - Progress (Week 1): Not met PT Short Term Goal 3 (Week 1): Pt will transfer bed<>chair with LRAD and min A PT Short Term Goal 3 - Progress (Week 1): Not met Week 2:  PT Short Term Goal 1 (Week 2): Pt will transfer bed<>chair with LRAD and min A PT Short Term Goal 1 - Progress (Week 2): Progressing toward goal PT Short Term Goal 2 (Week 2): Pt will transfer sit<>stand with LRAD and mod A PT Short Term Goal 2 - Progress (Week 2): Met PT Short Term Goal 3 (Week 2): pt will perform all bed mobility with CGA PT Short Term Goal 3 - Progress (Week 2): Progressing toward goal PT Short Term Goal 4 (Week 2): pt will recall pressure relief schedule and safe techniques with pressure relief PT Short Term Goal 4 - Progress (Week 2): Met PT Short Term Goal 5 (Week 2): pt will tolerate sitting OOB for 45 mins without adverse effects PT Short Term Goal 5 - Progress (Week 2): Met Week 3:  PT Short Term Goal 1 (Week 3): Pt will transfer bed<>chair with LRAD and min A PT Short Term Goal 1 - Progress (Week 3): Met (with slide board, mod A with RW) PT Short Term Goal 2 (Week 3): Pt will transfer sit<>stand with LRAD and mod A from WC PT Short Term Goal 2 - Progress (Week 3): Met PT Short Term Goal 3 (Week 3): pt will perform all bed mobility with CGA PT Short Term Goal 3 - Progress (Week 3): Met Week 4:  PT Short Term Goal 1 (Week 4): pt to demonstrate min A STS from various surfaces with RW PT Short Term Goal 2 (Week 4): pt to demonstrate gait  training with RW mod A for 15'  Skilled Therapeutic Interventions/Progress Updates:    pt received in bed, reporting he does not feel well complaining of new cough and is very tired. BP taken in supine to 118/72 and heart rate 91. Pt reported he does not know why people keep waking him up, PT educated pt that therapy time is scheduled and therapist need to wake him to participate. Pt agreed.  Pt denied to attempt standing, and sitting EOB reported during his AM OT session he felt too tired to fully sit up and "almost fell out of the chair". PT attempted to encourage pt to participate in OOB therapy for at least this session however pt continued to deny, pt only agreeable to in bed exercises reporting, "My body is telling me to rest.". pt directed in 2x10 sidelying hip abduction, adduction, bridging, heel slides, glute squeezes, alternating knees to chest. Pt easily returning to sleep throughout session, needed verbal stimuli to awaken pt. Pt denied to sit up again at end of session reported too tired. Pt left in supine, All needs in reach and in good condition. Call light in hand.  Nursing made aware of pt's complaints.   Therapy Documentation Precautions:  Precautions Precautions: Fall Precaution Comments: wounds on  bilat feet and L sacrum, monitor orthostatic BPs Required Braces or Orthoses: Other Brace Other Brace: abdominal binder Restrictions Weight Bearing Restrictions: No General: PT Amount of Missed Time (min): 11 Minutes Vital Signs:   Pain:   Mobility:   Locomotion :    Trunk/Postural Assessment :    Balance:   Exercises:   Other Treatments:      Therapy/Group: Individual Therapy  [name] 06/29/2020, 9:52 AM

## 2020-06-30 LAB — CBC WITH DIFFERENTIAL/PLATELET
Abs Immature Granulocytes: 0.08 10*3/uL — ABNORMAL HIGH (ref 0.00–0.07)
Basophils Absolute: 0 10*3/uL (ref 0.0–0.1)
Basophils Relative: 1 %
Eosinophils Absolute: 0.1 10*3/uL (ref 0.0–0.5)
Eosinophils Relative: 2 %
HCT: 30.9 % — ABNORMAL LOW (ref 39.0–52.0)
Hemoglobin: 9.8 g/dL — ABNORMAL LOW (ref 13.0–17.0)
Immature Granulocytes: 1 %
Lymphocytes Relative: 18 %
Lymphs Abs: 1.4 10*3/uL (ref 0.7–4.0)
MCH: 26.5 pg (ref 26.0–34.0)
MCHC: 31.7 g/dL (ref 30.0–36.0)
MCV: 83.5 fL (ref 80.0–100.0)
Monocytes Absolute: 1.1 10*3/uL — ABNORMAL HIGH (ref 0.1–1.0)
Monocytes Relative: 14 %
Neutro Abs: 4.8 10*3/uL (ref 1.7–7.7)
Neutrophils Relative %: 64 %
Platelets: 272 10*3/uL (ref 150–400)
RBC: 3.7 MIL/uL — ABNORMAL LOW (ref 4.22–5.81)
RDW: 16.1 % — ABNORMAL HIGH (ref 11.5–15.5)
WBC: 7.5 10*3/uL (ref 4.0–10.5)
nRBC: 0 % (ref 0.0–0.2)

## 2020-06-30 LAB — GLUCOSE, CAPILLARY
Glucose-Capillary: 100 mg/dL — ABNORMAL HIGH (ref 70–99)
Glucose-Capillary: 113 mg/dL — ABNORMAL HIGH (ref 70–99)
Glucose-Capillary: 168 mg/dL — ABNORMAL HIGH (ref 70–99)
Glucose-Capillary: 160 mg/dL — ABNORMAL HIGH (ref 70–99)

## 2020-06-30 LAB — PROCALCITONIN
Procalcitonin: 1.5 ng/mL
Procalcitonin: 2.29 ng/mL

## 2020-06-30 MED ORDER — NEPRO/CARBSTEADY PO LIQD
237.0000 mL | Freq: Three times a day (TID) | ORAL | Status: DC
  Administered 2020-06-30 – 2020-07-16 (×29): 237 mL via ORAL

## 2020-06-30 NOTE — Progress Notes (Signed)
Nutrition Follow-up  DOCUMENTATION CODES:   Morbid obesity  INTERVENTION:   -Continue renal MVI daily  -ContinueProSource Plus30 ml poTID, each supplement provides 100 kcals and 15 grams protein  - Increase Nepro Shake poto TID, each supplement provides 425 kcal and 19 grams protein  -Continue double protein portions at each meal  - [place] yogurt TID with meals  - Encourage adequate PO intake  NUTRITION DIAGNOSIS:   Increased nutrient needs related to wound healing as evidenced by estimated needs.  Ongoing  GOAL:   Patient will meet greater than or equal to 90% of their needs  Progressing  MONITOR:   PO intake,Supplement acceptance,Labs,Weight trends,Skin,I & O's  REASON FOR ASSESSMENT:   Malnutrition Screening Tool    ASSESSMENT:   50 year old right-handed male with PMH of HTN, DM, ESRD on HD. Pt admitted with critical illness/mypoathy/debilitation secondary to COVID-19.  Last HD was on 06/29/20 with 0 ml net UF. Post-HD weight was 119 kg.  Weight was 158.8 kg on admission, now 119 kg. Question accuracy of weights given large drop in weight over short period of time. Suspect pt may have been being weighed on bed scale then transitioned to standing scale or visa versa. If accurate, pt has experienced a 39.8 kg weight loss in less than 1 month. This is a 25% weight loss which is severe and significant for timeframe.  Spoke with pt and family members at bedside who were concerned with pt's weight loss. They have noticed that he looks like he has lost weight. Pt reports that the food here tastes terrible. Per pt, the food tastes like the oil it is cooked in is bad. Pt states that most of the meats taste this way and it has caused him to vomit on several occasions. Pt had just eaten a soft taco from [employer] that his family brought in for him. He reports that this tastes fine. He reports no issues after eating the taco. Noted emesis bag at bedside but pt reports  that this is from earlier. Family lives 2 hours away and not able to bring in food regularly.  Because meats in particular (and some vegetables) tastes bad to the pt, he has mostly been eating grilled cheese sandwiches or grilled ham and cheese sandwiches with fruit.  Discussed with pt other menu options that he could try that would provide protein. Pt willing to increase Nepro form BID to TID. Pt states that he is typically drinking 2 of these shakes a daily. Pt states that he is taking the ProSource but does not like it. Explained importance of ProSource given poor PO intake of protein-rich foods. RD will also order [place] yogurt TID with meals.  Meal Completion: 0-100%  Medications reviewed and include: ProSource Plus TID, aranesp, pepcid, Nepro BID, SSI, lantus 11 units daily, fosrenol TID, rena-vit, miralax, IV abx  Labs reviewed: sodium 133, phosphorus 4.7, hemoglobin 9.8 CBG's: 100-146 x 24 hours  UOP: 75 ml x 24 hours  NUTRITION - FOCUSED PHYSICAL EXAM:  Flowsheet Row Most Recent Value  Orbital Region Mild depletion  Upper Arm Region No depletion  Thoracic and Lumbar Region No depletion  Buccal Region No depletion  Temple Region Mild depletion  Clavicle Bone Region Mild depletion  Clavicle and Acromion Bone Region Mild depletion  Scapular Bone Region No depletion  Dorsal Hand No depletion  Patellar Region Moderate depletion  Anterior Thigh Region Moderate depletion  Posterior Calf Region Moderate depletion  Edema (RD Assessment) Mild  Hair Reviewed  Eyes Reviewed  Mouth Reviewed  Skin Reviewed  Nails Reviewed       Diet Order:   Diet Order            Diet Carb Modified Fluid consistency: Thin; Room service appropriate? Yes  Diet effective now                 EDUCATION NEEDS:   No education needs have been identified at this time  Skin:  Skin Assessment: Skin Integrity Issues: Stage IV: left buttock Unstageable: left lateral ankle, left posterior foot,  right medial foot, right heel  Last BM:  06/29/20  Height:   Ht Readings from Last 1 Encounters:  06/06/20 '6\' 6"'$  (1.981 m)    Weight:   Wt Readings from Last 1 Encounters:  06/29/20 119 kg    Ideal Body Weight:  97.3 kg  BMI:  Body mass index is 30.32 kg/m.  Estimated Nutritional Needs:   Kcal:  [id number]  Protein:  165-195 grams  Fluid:  1.2 L    Gustavus Bryant, MS, RD, LDN Inpatient Clinical Dietitian Please see [facility] for contact information.

## 2020-06-30 NOTE — Progress Notes (Signed)
[name] PHYSICAL MEDICINE & REHABILITATION PROGRESS NOTE   Subjective/Complaints:  Pt reports had a small BM yesterday- upset that L foot "Burst open"  Yesterday- when blister/skin popped- said there was a "hump there that went away" with rupture of blood blister?.  Has a little cough- but said due to dry spot in throat- doesn't need cough meds for it.   Refused reglan and Vanc IV that was restarted due to T 100.9 with tachycardia last night- can stop reglan, but needs IV Vanc.   Of note, declined PT yesterday. Explained declining therapy right before d/c is not a good idea.   ROS:  Pt denies SOB, abd pain, CP, N/V/C/D, and vision changes   Objective:   DG Chest 2 View  Result Date: 06/29/2020 CLINICAL DATA:  Fever and chills EXAM: CHEST - 2 VIEW COMPARISON:  Chest radiograph June 16, 2020 FINDINGS: Right IJ CVC with tip overlying the superior cavoatrial junction. The heart size and mediastinal contours are unchanged. Slightly reduced lung volumes. Similar minimal patchy left basilar opacity. No new focal consolidation. The visualized skeletal structures are unchanged. IMPRESSION: Similar minimal patchy left basilar opacity likely atelectasis. Electronically Signed   By: Dahlia Bailiff MD   On: 06/29/2020 21:47   Recent Labs    06/29/20 1645 06/30/20 0513  WBC 10.9* 7.5  HGB 10.5* 9.8*  HCT 34.5* 30.9*  PLT 323 272   Recent Labs    06/28/20 0537 06/29/20 1645  NA 134* 133*  K 4.2 4.8  CL 99 96*  CO2 25 25  GLUCOSE 103* 119*  BUN 45* 63*  CREATININE 6.10* 7.51*  CALCIUM 9.7 9.9    Intake/Output Summary (Last 24 hours) at 06/30/2020 0915 Last data filed at 06/30/2020 7412 Gross per 24 hour  Intake 240 ml  Output 75 ml  Net 165 ml     Pressure Injury 05/01/20 Buttocks Left Stage 4 - Full thickness tissue loss with exposed bone, tendon or muscle. packed with santyl gauze, dressed by WOCN, (Active)  05/01/20 0230  Location: Buttocks  Location Orientation: Left   Staging: Stage 4 - Full thickness tissue loss with exposed bone, tendon or muscle.  Wound Description (Comments): packed with santyl gauze, dressed by WOCN,  Present on Admission: Yes     Pressure Injury 05/01/20 Ankle Left;Lateral Unstageable - Full thickness tissue loss in which the base of the injury is covered by slough (yellow, tan, gray, green or brown) and/or eschar (tan, brown or black) in the wound bed. smal circular wound on the (Active)  05/01/20 0230  Location: Ankle  Location Orientation: Left;Lateral  Staging: Unstageable - Full thickness tissue loss in which the base of the injury is covered by slough (yellow, tan, gray, green or brown) and/or eschar (tan, brown or black) in the wound bed.  Wound Description (Comments): smal circular wound on the bone  Present on Admission: Yes     Pressure Injury 05/01/20 Foot Left;Posterior Unstageable - Full thickness tissue loss in which the base of the injury is covered by slough (yellow, tan, gray, green or brown) and/or eschar (tan, brown or black) in the wound bed. 4th and 5th toes appear n (Active)  05/01/20 0230  Location: Foot (4th and 5th toes and lateral/anterior side of the sole)  Location Orientation: Left;Posterior  Staging: Unstageable - Full thickness tissue loss in which the base of the injury is covered by slough (yellow, tan, gray, green or brown) and/or eschar (tan, brown or black) in the wound bed.  Wound Description (Comments): 4th and 5th toes appear necrotic circular wound on sole  Present on Admission: Yes     Pressure Injury 05/01/20 Foot Right;Medial;Posterior Unstageable - Full thickness tissue loss in which the base of the injury is covered by slough (yellow, tan, gray, green or brown) and/or eschar (tan, brown or black) in the wound bed. large circular wo (Active)  05/01/20 0230  Location: Foot  Location Orientation: Right;Medial;Posterior  Staging: Unstageable - Full thickness tissue loss in which the base of  the injury is covered by slough (yellow, tan, gray, green or brown) and/or eschar (tan, brown or black) in the wound bed.  Wound Description (Comments): large circular wound on sole  and heel  Present on Admission: Yes     Pressure Injury 05/08/20 Heel Right Unstageable - Full thickness tissue loss in which the base of the injury is covered by slough (yellow, tan, gray, green or brown) and/or eschar (tan, brown or black) in the wound bed. (Active)  05/08/20 1718  Location: Heel  Location Orientation: Right  Staging: Unstageable - Full thickness tissue loss in which the base of the injury is covered by slough (yellow, tan, gray, green or brown) and/or eschar (tan, brown or black) in the wound bed.  Wound Description (Comments):   Present on Admission: Yes    Physical Exam: Vital Signs Blood pressure (!) 156/85, pulse 91, temperature 99 F (37.2 C), resp. rate 18, height 6' 6"  (1.981 m), weight 119 kg, SpO2 97 %. Constitutional: awake, alert, but drowsy, laying in bed- kept forgetting what we were discussing HENT: Normocephalic.  Atraumatic. Eyes: EOMI. No discharge. Cardiovascular: RRR- no JVD Respiratory: CTA B/L- no W/R/R- good air movement- no wheezing, sounds clear GI: Soft, NT, ND, (+)BS   Skin: pressure ulcers not examined since working with therapy Psych: flat- very disengaged- c/o "no therapy" when refused it yesterday.  Extremities: edema in extremities.  No tenderness in extremities. Neuro: Alert Motor: Bilateral upper extremities: 5/5 proximal distal Bilateral lower extremities: 4 -/5 proximal distal (?  Participation) Skin: L foot- has ruptured blood blister 5th MTP base; also 4th toe is dry gangrene- all black eschar.   Assessment/Plan: 1. Functional deficits which require 3+ hours per day of interdisciplinary therapy in a comprehensive inpatient rehab setting.  Physiatrist is providing close team supervision and 24 hour management of active medical problems listed  below.  Physiatrist and rehab team continue to assess barriers to discharge/monitor patient progress toward functional and medical goals  Care Tool:  Bathing    Body parts bathed by patient: Right arm,Left arm,Chest,Abdomen,Right upper leg,Left upper leg,Face   Body parts bathed by helper: Right lower leg,Left lower leg,Buttocks     Bathing assist Assist Level: Minimal Assistance - Patient > 75% (declined LB today, EOB level)     Upper Body Dressing/Undressing Upper body dressing   What is the patient wearing?: Pull over shirt    Upper body assist Assist Level: Set up assist    Lower Body Dressing/Undressing Lower body dressing      What is the patient wearing?: Pants     Lower body assist Assist for lower body dressing: Set up assist     Toileting Toileting    Toileting assist Assist for toileting: Maximal Assistance - Patient 25 - 49% Assistive Device Comment:  (urinal)   Transfers Chair/bed transfer  Transfers assist  Chair/bed transfer activity did not occur: Safety/medical concerns (Per nursing report)  Chair/bed transfer assist level: Minimal Assistance - Patient >  75% Chair/bed transfer assistive device: Sliding board   Locomotion Ambulation   Ambulation assist   Ambulation activity did not occur: Safety/medical concerns (fatigue, lightheadedness, generalized weakness)  Assist level: Minimal Assistance - Patient > 75% Assistive device: Parallel bars Max distance: 24 ft   Walk 10 feet activity   Assist  Walk 10 feet activity did not occur: Safety/medical concerns (fatigue, lightheadedness, generalized weakness)  Assist level: Minimal Assistance - Patient > 75% Assistive device: Parallel bars   Walk 50 feet activity   Assist Walk 50 feet with 2 turns activity did not occur: Safety/medical concerns (fatigue, lightheadedness, generalized weakness)         Walk 150 feet activity   Assist Walk 150 feet activity did not occur:  Safety/medical concerns (fatigue, lightheadedness, generalized weakness)         Walk 10 feet on uneven surface  activity   Assist Walk 10 feet on uneven surfaces activity did not occur: Safety/medical concerns (fatigue, lightheadedness, generalized weakness)         Wheelchair     Assist Will patient use wheelchair at discharge?: Yes Type of Wheelchair: Manual    Wheelchair assist level: Supervision/Verbal cueing Max wheelchair distance: 100    Wheelchair 50 feet with 2 turns activity    Assist        Assist Level: Supervision/Verbal cueing   Wheelchair 150 feet activity     Assist      Assist Level: Minimal Assistance - Patient > 75%   Blood pressure (!) 156/85, pulse 91, temperature 99 F (37.2 C), resp. rate 18, height 6' 6"  (1.981 m), weight 119 kg, SpO2 97 %.  Medical Problem List and Plan: 1.  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  Antithrombotics: -DVT/anticoagulation: Subcutaneous heparin.               -antiplatelet therapy: N/A 3. Pain Management: Tylenol as needed  2/8- pain controlled- con't regimen 4. Mood: Provide emotional support             -antipsychotic agents: N/A 5. Neuropsych: This patient is? capable of making decisions on his own behalf. 6. Skin/Wound Care: Routine skin checks 7. Fluids/Electrolytes/Nutrition: Routine in and outs with follow-up chemistries 8.  ESRD.  Hemodialysis per renal services 9.  Sacral decubitus ulcer as well as ischemic ulcers on the left foot fourth toe and fifth metatarsal head and right heel and fifth metatarsal head.  Wound care nurse follow-up.  Patient to see Dr. Sharol Given outpatient.  Continue bilateral PRAFO's. 10.  ID/ coccygeal osteomyelitis.    Completed vancomycin and cefepime on 06/10/2020  1/18- will schedule turning q2 hours to get off wounds- also will make sure on specialty bed  1/20- is on specialty bed  Repeat labs ordered for  Monday  2/7- CRP 6.5- ESR pending - con't regimen  2/8- ESR  DOWN TO 67 FROM 73- SO HEADING IN RIGHT DIRECTION  2/9- Spiked temp last night of 100.9- restarted Vanc/Cefipime- pt refused Vanc "since causes diarrhea". WBC now normal again this AM- will con't for now. CXR (-); is anuric-  10.  Anemia of chronic disease.  Continue Aranesp.   Hemoglobin 10.0 on 2/3, labs with HD 11.  Diabetes mellitus with peripheral neuropathy .  Hemoglobin A1c 5.6.  Lantus insulin 11 units nightly  CBG (last 3)  Recent Labs    06/29/20 1936 06/29/20 2102 06/30/20 0615  GLUCAP 109* 146* 100*    2/9- BGs 100-146- con't regimen 12.  Morbid obesity.  BMI 38.37.  Dietary follow-up 13. Dizziness/nausea with HTN and hypotension  Vitals:   06/30/20 0240 06/30/20 0835  BP: 118/80 (!) 156/85  Pulse: 96 91  Resp: 18 18  Temp: 99.3 F (37.4 C) 99 F (37.2 C)  SpO2:  97%    Hydralazine prn   Norvasc to 7.5 mg daily   2/9- BP 156/85 this AM- but has been controlled last few days 14. Diarrhea with new constipation  Bowel meds increased on 2/5  2/7- will decrease bowel meds again since 8+ BMs in last 24 hours- con't regimen 15. Leukocytosis: Resolved  ESR/CRP and CBC with diff ordered for Monday.  2/7- CRP 6.5- stable- con't regimen   2/8- EST 67- down to 73- con't regimen 16. Dispo  2/8- pt unhappy that has to back to SNF- however has been participating intermittently- finally doing more/more interactive, but we can only keep max of 30 days. Sent FL2  2/9- pt refused PT yesterday -only bedlevel Exercises- pt needs max encouragement to progress with therapy     LOS: 24 days A FACE TO FACE EVALUATION WAS PERFORMED  Shamiya Demeritt 06/30/2020, 9:15 AM

## 2020-06-30 NOTE — Progress Notes (Signed)
Patient refused IV Vancomycin states it causes him to have diarrhea and "I don't want any antibiotics,states he also may pull out the IV from his arm because it irritates him. Writer was able to convince this patient to allow IV  To infuse. On call notified of patient refusal

## 2020-06-30 NOTE — Progress Notes (Signed)
Occupational Therapy Session Note  [name] MRN: [id number] Date of Birth: 02/22/1971  Today's Date: 06/30/2020 OT Individual Time: 5465-6812 and 7517-0017 OT Individual Time Calculation (min): 54 min and 58 min   Short Term Goals: Week 2:  OT Short Term Goal 1 (Week 2): Pt will tolerate sitting OOB for 2-3 hours OT Short Term Goal 1 - Progress (Week 2): Progressing toward goal OT Short Term Goal 2 (Week 2): Pt will complete LB bathing and dressing with Min A w/ AE PRN OT Short Term Goal 2 - Progress (Week 2): Met OT Short Term Goal 3 (Week 2): Pt will complete transfer to [facility] with LRAD with Min A OT Short Term Goal 3 - Progress (Week 2): Progressing toward goal OT Short Term Goal 4 (Week 2): Pt will perform sit to stand at [facility] with Max A of 1 consistently OT Short Term Goal 4 - Progress (Week 2): Met Week 3:  OT Short Term Goal 1 (Week 3): STGs=LTGs due to ELOS   Skilled Therapeutic Interventions/Progress Updates:    Pt greeted at time of session semireclined in bed resting holding emesis bag, partially full of emesis. Agreeable to OT session, stating N/V had passed. Noted to have emesis on shirt, supine > sit CGA and sat EOB to change shirt Set up/supervision. Discussed plans for family ed this afternoon to do full ADL then. Slide board > wheelchair with pt directing his care and set up board, Min/CGA. Self propel to sink, cues to lock brakes before completing face washing and oral hygiene Mod I. At this time noted pt's L foot started bleeding slightly from wound, RN notified and recommended replace Mepiplex which was done. At this time pt with intense dizziness like he had felt in previous weeks, symptomatic and very clammy stating room was "spinning round and round." Slide board back to bed Min A for last bump d/t fatigue and dizziness. BP checked and WNL but pt had already been in supine for 1-2 minutes at that time. Changed socks with Min A to thread over  toes, figure four and hamstring stretch performed to improve flexibility for LB ADL. Pt in supine resting no n/v, call bell in reach all needs met.  Session 2: Pt greeted at time of session supine in bed stating he had BM needing to be changed, family sister and father present for family ed as well and remained for entire session. Family present for brief change as pt has been having inconsistent continence, pt rolling L/R with Supervision for dependent brief change with family ed on how to perform hygiene for BM in case of incontinent episodes, also discussed and demonstrated bed pan use for when pt is able to have continent BM. Explained that right now does not have strength to consistently get up/down from [facility]. LB bathing bed level with set up, donned pants bed level in same manner with rolling L/R to don over hips. Supine > sit Supervision and UB bathe/dress set up as well. Slide board bed > w/c with Min/CGA and sister providing CGA/Min for slide board back to bed with cues for her hand/body placement. Performed sit > stand Mod A once with therapist and once with sister for practice for future standing ADL tasks. Demonstrated for family importance of anterior weight shift for slide board transfers. Discussed DC plan throughout session, family present and participated in ADL session. Pt in bed supine resting with call bell inreach all needs met.  Note discussed with  family that home entrance is 21 inches wide and pt's wheelchair will not fit - needs to be able to walk in/out of house.     Therapy Documentation Precautions:  Precautions Precautions: Fall Precaution Comments: wounds on bilat feet and L sacrum, monitor orthostatic BPs Required Braces or Orthoses: Other Brace Other Brace: abdominal binder Restrictions Weight Bearing Restrictions: No     Therapy/Group: Individual Therapy  [name] 06/30/2020, 11:18 AM

## 2020-06-30 NOTE — Progress Notes (Signed)
Pt vomited 100 ml of brown emesis. Vomit had pieces of medication that was administered. PRN Zofran administered pt resting at this time.

## 2020-06-30 NOTE — Progress Notes (Signed)
Subjective: Seen in room, currently about to have physical therapy, noted decline PT yesterday also low-grade temp 100.9 last night at first refused IV vancomycin.  Denies any pain at PermCath site said tolerated dialysis yesterday no UF  Objective Vital signs in last 24 hours: Vitals:   06/29/20 2036 06/29/20 2227 06/30/20 0240 06/30/20 0835  BP: 138/88 (!) 143/82 118/80 (!) 156/85  Pulse: (!) 104 93 96 91  Resp: '20 20 18 18  '$ Temp: 99.4 F (37.4 C) 98.3 F (36.8 C) 99.3 F (37.4 C) 99 F (37.2 C)  TempSrc: Oral Oral Oral   SpO2: 99% 100%  97%  Weight:      Height:       Weight change:    Physical Exam: General:Alert, obese ,WDWN male, NAD Heart:RRR, no MRG Lungs:CTA bilaterally without wheezing, rhonchi or rales Abdomen:Obese, soft, non-tender, non-distended, +BS Extremities:No edema b/l lower extremities Dialysis Access:R IJ TDC dressing no discharge at site, nontender   Dialysis Orders: NWTTS 4h 400/800 143.5kg 3K/2.5Ca TDCHeparin3000units IV TIW - Mircera154mgIVq2wks - last 12/8 - Venofer '100mg'$ IVqHD x10 - completed 6/10 doses  Problem/Plan: 1. Debilitation with history of prolonged hospitalization COVID 04/20/2020 sacral decub/coccygealosteo:S/p 6 weeks ofVanc/Cefepime (finished1/20/22) - In CIR. 2. ESRD HD TIN:[id number] usual TTS schedule. Last reported did  tolerate HD well without dizziness.  Only 2-1/2 HD treatment due Vast ^^ patient load 3. 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 Low-grade temperature= blood cultures pending, chest x-ray minimal patchy left basilar opacity likely atelectasis, PermCath site looks stable no fever spikes on dialysis work-up per admit team 5. Diarrhea -Recurrent issue,  initially thought d/t Renvelawhich has now been stoppedbut still having issues with diarrhea on Fosrenol. Primary team suspects poor sphincter tone 6. BP/volume:Overall BP control is  improved.Dizziness is not consistently associated with lower blood pressuresbut he did have + orthostatic hypotension 2/06.  We have  let volume come up a bit, 0.5 L UF with HD tomorrow keep systolic BP over [id number] this is not effective in reducing dizziness, may need to decrease amlodipine 7 .5 mg nightly dosage further..  7. Anemiaof CKD: Hgb9.8 cont Aranesp 2056mq Sat.,  Check iron studies next dialysis tomorrow 8. Secondary Hyperparathyroidism:CorrCaelevated,use low Ca bath.Not on VDRA.on Fosrenol with PO4  4.7  but still with diarrhea, not convinced binders are the issue since he has also had periods of constipation while on fosrenol.PO4 at goal. 9. Nutrition:Alb 2.3 remains low, continue Nepro. 10. DMT2 - per admit   DaErnest HaberPA-C [facility] Beeper [phone]/01/2021,2:16 PM  LOS: 24 days   Labs: Basic Metabolic Panel: Recent Labs  Lab 06/26/20 1709 06/28/20 0537 06/29/20 1645  NA 132* 134* 133*  K 4.7 4.2 4.8  CL 95* 99 96*  CO2 '26 25 25  '$ GLUCOSE 180* 103* 119*  BUN 52* 45* 63*  CREATININE 6.58* 6.10* 7.51*  CALCIUM 9.4 9.7 9.9  PHOS 4.5 4.2 4.7*   Liver Function Tests: Recent Labs  Lab 06/26/20 1709 06/28/20 0537 06/29/20 1645  ALBUMIN 2.2* 2.2* 2.3*   No results for input(s): LIPASE, AMYLASE in the last 168 hours. No results for input(s): AMMONIA in the last 168 hours. CBC: Recent Labs  Lab 06/24/20 1314 06/26/20 1709 06/28/20 0537 06/29/20 1645 06/30/20 0513  WBC 9.9 8.1 9.3 10.9* 7.5  NEUTROABS  --   --  5.7  --  4.8  HGB 10.0* 10.0* 10.9* 10.5* 9.8*  HCT 32.3* 33.7* 34.0* 34.5* 30.9*  MCV  85.0 86.0 84.0 83.9 83.5  PLT 255 286 330 323 272   Cardiac Enzymes: No results for input(s): CKTOTAL, CKMB, CKMBINDEX, TROPONINI in the last 168 hours. CBG: Recent Labs  Lab 06/29/20 1125 06/29/20 1936 06/29/20 2102 06/30/20 0615 06/30/20 1135  GLUCAP 120* 109* 146* 100* 113*    Studies/Results: DG Chest 2  View  Result Date: 06/29/2020 CLINICAL DATA:  Fever and chills EXAM: CHEST - 2 VIEW COMPARISON:  Chest radiograph June 16, 2020 FINDINGS: Right IJ CVC with tip overlying the superior cavoatrial junction. The heart size and mediastinal contours are unchanged. Slightly reduced lung volumes. Similar minimal patchy left basilar opacity. No new focal consolidation. The visualized skeletal structures are unchanged. IMPRESSION: Similar minimal patchy left basilar opacity likely atelectasis. Electronically Signed   By: Dahlia Bailiff MD   On: 06/29/2020 21:47   Medications: . [START ON 07/01/2020] ceFEPime (MAXIPIME) IV    . [START ON 07/01/2020] vancomycin    . vancomycin     . (feeding supplement) PROSource Plus  30 mL Oral TID BM  . amLODipine  7.5 mg Oral QHS  . Chlorhexidine Gluconate Cloth  6 each Topical BID  . darbepoetin (ARANESP) injection - DIALYSIS  200 mcg Intravenous Q Tue-HD  . famotidine  20 mg Oral QHS  . feeding supplement (NEPRO CARB STEADY)  237 mL Oral TID BM  . heparin injection (subcutaneous)  7,500 Units Subcutaneous Q8H  . insulin aspart  0-9 Units Subcutaneous TID WC  . insulin glargine  11 Units Subcutaneous QHS  . lanthanum  500 mg Oral TID WC  . multivitamin  1 tablet Oral QHS  . pneumococcal 23 valent vaccine  0.5 mL Intramuscular Tomorrow-1000  . polyethylene glycol  17 g Oral Daily

## 2020-06-30 NOTE — Progress Notes (Signed)
Pt stated he was having chills and temp 99.1 gave prn tylenol. Pt also has a cough, noted that its from dry area in throat. Pt denies pain at this time .

## 2020-06-30 NOTE — Progress Notes (Signed)
Patient refuses Reglan states causes extreme diarreal and abd discomfort, denies pain at this time

## 2020-06-30 NOTE — Progress Notes (Signed)
Physical Therapy Session Note  [name] MRN: [id number] Date of Birth: 03-04-1971  Today's Date: 06/30/2020 PT Individual Time: 2023-3435 PT Individual Time Calculation (min): 70 min   Short Term Goals: Week 3:  PT Short Term Goal 1 (Week 3): Pt will transfer bed<>chair with LRAD and min A PT Short Term Goal 1 - Progress (Week 3): Met (with slide board, mod A with RW) PT Short Term Goal 2 (Week 3): Pt will transfer sit<>stand with LRAD and mod A from WC PT Short Term Goal 2 - Progress (Week 3): Met PT Short Term Goal 3 (Week 3): pt will perform all bed mobility with CGA PT Short Term Goal 3 - Progress (Week 3): Met  Skilled Therapeutic Interventions/Progress Updates: Pt presented in bed agreeable to therapy with family present. Pt c/o soreness in BLE and buttock but no numerical assessment and no intervention requested. Performed bed mobility with supervision and use of bed features. Performed SB transfer with minA for transfer and total A for SB set up and pt able to verbalize correct set up. PT transported to rehab gym total A for energy conservation. Pt performed SB transfer to mat with CGA and total A for SB set up. Pt agreeable to attempt standing from elevated surface and ambulate if able. Upon transfer to mat pt stating feeling fatigued and requesting to lay down. Pt transferred to sidelying with CGA and increased time and felt better after a few minutes. Pt returned to sitting and within 2 min requesting to lay down again. After several minutes BP checked prior to returning to sitting with BP 90s/70s and HR 113. DIscussed current complications with family and explained that per cart BP has been fluctuating but recently has been more stable. After several minutes BP checked again 85/56 with pt's symptoms worsening. PTA obtained ace bandages, abdominal binder and notified nsg. Abdominal binder donned in supine due to dropping BP and ace bandages donned to knees. After  several minutes BP checked again with slow improvement back to 90s/70s. Nsg checking BS and BP improved to 120s/90s. Pt then transferred to sitting with drop 100s/80s and no significant increase in symptoms and pt was able to transfer to w/c in same manner as prior. Pt transported back to room and performed SB transfer with PTA setting up board and pt performing transfer with CGA. Pt returned to supine with supervision and use of bed features. Pt left in bed with family present and Becky LSW present to discuss d/c with family.       Therapy Documentation Precautions:  Precautions Precautions: Fall Precaution Comments: wounds on bilat feet and L sacrum, monitor orthostatic BPs Required Braces or Orthoses: Other Brace Other Brace: abdominal binder Restrictions Weight Bearing Restrictions: No General:   Vital Signs:  Pain:   Mobility:   Locomotion :    Trunk/Postural Assessment :    Balance:   Exercises:   Other Treatments:      Therapy/Group: Individual Therapy  Trude Cansler 06/30/2020, 4:15 PM

## 2020-06-30 NOTE — Progress Notes (Addendum)
Patient ID: Fred [name], male   DOB: 05-18-1971, 50 y.o.   MRN: [id number]  Met with pt and dad and sister who were here for education. Pt was dong well until he had a BP issue. Pt is too much are for dad and sister at his current level. He will need to be ambulatory and not wheelchair level. He is agreeable for worker to look into [facility] since so close to his Dad's home. Awaiting response from them whether they can offer him a bed. His HD would need to be changed and would need to arrange transport there. Sister has built a ramp into Dad's home and gotten a adjustable bed for him. Will call [facility] and rehab again, see if received information sent yesterday.

## 2020-07-01 ENCOUNTER — Inpatient Hospital Stay (HOSPITAL_COMMUNITY): Payer: Medicare Other

## 2020-07-01 DIAGNOSIS — R509 Fever, unspecified: Secondary | ICD-10-CM

## 2020-07-01 DIAGNOSIS — A419 Sepsis, unspecified organism: Secondary | ICD-10-CM

## 2020-07-01 DIAGNOSIS — L89150 Pressure ulcer of sacral region, unstageable: Secondary | ICD-10-CM

## 2020-07-01 DIAGNOSIS — N186 End stage renal disease: Secondary | ICD-10-CM

## 2020-07-01 LAB — CBC
HCT: 32.4 % — ABNORMAL LOW (ref 39.0–52.0)
Hemoglobin: 9.8 g/dL — ABNORMAL LOW (ref 13.0–17.0)
MCH: 25.4 pg — ABNORMAL LOW (ref 26.0–34.0)
MCHC: 30.2 g/dL (ref 30.0–36.0)
MCV: 83.9 fL (ref 80.0–100.0)
Platelets: 311 10*3/uL (ref 150–400)
RBC: 3.86 MIL/uL — ABNORMAL LOW (ref 4.22–5.81)
RDW: 15.9 % — ABNORMAL HIGH (ref 11.5–15.5)
WBC: 10.5 10*3/uL (ref 4.0–10.5)
nRBC: 0 % (ref 0.0–0.2)

## 2020-07-01 LAB — RENAL FUNCTION PANEL
Albumin: 2.1 g/dL — ABNORMAL LOW (ref 3.5–5.0)
Anion gap: 11 (ref 5–15)
BUN: 47 mg/dL — ABNORMAL HIGH (ref 6–20)
CO2: 25 mmol/L (ref 22–32)
Calcium: 9 mg/dL (ref 8.9–10.3)
Chloride: 98 mmol/L (ref 98–111)
Creatinine, Ser: 6.79 mg/dL — ABNORMAL HIGH (ref 0.61–1.24)
GFR, Estimated: 9 mL/min — ABNORMAL LOW (ref >60)
Glucose, Bld: 110 mg/dL — ABNORMAL HIGH (ref 70–99)
Phosphorus: 4.2 mg/dL (ref 2.5–4.6)
Potassium: 4.5 mmol/L (ref 3.5–5.1)
Sodium: 134 mmol/L — ABNORMAL LOW (ref 135–145)

## 2020-07-01 LAB — PROCALCITONIN: Procalcitonin: 2.72 ng/mL

## 2020-07-01 LAB — GLUCOSE, CAPILLARY
Glucose-Capillary: 130 mg/dL — ABNORMAL HIGH (ref 70–99)
Glucose-Capillary: 106 mg/dL — ABNORMAL HIGH (ref 70–99)
Glucose-Capillary: 121 mg/dL — ABNORMAL HIGH (ref 70–99)
Glucose-Capillary: 126 mg/dL — ABNORMAL HIGH (ref 70–99)
Glucose-Capillary: 142 mg/dL — ABNORMAL HIGH (ref 70–99)

## 2020-07-01 LAB — IRON AND TIBC
Iron: 10 ug/dL — ABNORMAL LOW (ref 45–182)
Saturation Ratios: 8 % — ABNORMAL LOW (ref 17.9–39.5)
TIBC: 125 ug/dL — ABNORMAL LOW (ref 250–450)
UIBC: 115 ug/dL

## 2020-07-01 MED ORDER — SODIUM CHLORIDE 0.9 % IV SOLN
100.0000 mg | INTRAVENOUS | Status: AC
  Administered 2020-07-03 – 2020-07-13 (×5): 100 mg via INTRAVENOUS
  Filled 2020-07-01 (×9): qty 5

## 2020-07-01 MED ORDER — HEPARIN SODIUM (PORCINE) 1000 UNIT/ML IJ SOLN
INTRAMUSCULAR | Status: AC
  Administered 2020-07-01: 1000 [IU]
  Filled 2020-07-01: qty 4

## 2020-07-01 MED ORDER — VANCOMYCIN HCL IN DEXTROSE 1-5 GM/200ML-% IV SOLN
INTRAVENOUS | Status: AC
  Filled 2020-07-01: qty 200

## 2020-07-01 NOTE — Progress Notes (Incomplete)
Physical Therapy Session Note  [name] MRN: [id number] Date of Birth: [birth year]  Today's Date: 07/01/2020 PT Individual Time:  -      Short Term Goals: Week 1:  PT Short Term Goal 1 (Week 1): pt will perform all bed mobility with CGA PT Short Term Goal 1 - Progress (Week 1): Not met PT Short Term Goal 2 (Week 1): Pt will transfer sit<>stand with LRAD and mod A PT Short Term Goal 2 - Progress (Week 1): Not met PT Short Term Goal 3 (Week 1): Pt will transfer bed<>chair with LRAD and min A PT Short Term Goal 3 - Progress (Week 1): Not met Week 2:  PT Short Term Goal 1 (Week 2): Pt will transfer bed<>chair with LRAD and min A PT Short Term Goal 1 - Progress (Week 2): Progressing toward goal PT Short Term Goal 2 (Week 2): Pt will transfer sit<>stand with LRAD and mod A PT Short Term Goal 2 - Progress (Week 2): Met PT Short Term Goal 3 (Week 2): pt will perform all bed mobility with CGA PT Short Term Goal 3 - Progress (Week 2): Progressing toward goal PT Short Term Goal 4 (Week 2): pt will recall pressure relief schedule and safe techniques with pressure relief PT Short Term Goal 4 - Progress (Week 2): Met PT Short Term Goal 5 (Week 2): pt will tolerate sitting OOB for 45 mins without adverse effects PT Short Term Goal 5 - Progress (Week 2): Met Week 3:  PT Short Term Goal 1 (Week 3): Pt will transfer bed<>chair with LRAD and min A PT Short Term Goal 1 - Progress (Week 3): Met (with slide board, mod A with RW) PT Short Term Goal 2 (Week 3): Pt will transfer sit<>stand with LRAD and mod A from WC PT Short Term Goal 2 - Progress (Week 3): Met PT Short Term Goal 3 (Week 3): pt will perform all bed mobility with CGA PT Short Term Goal 3 - Progress (Week 3): Met  Skilled Therapeutic Interventions/Progress Updates:    PAIN          Therapy Documentation Precautions:  Precautions Precautions: Fall Precaution Comments: wounds on bilat feet and L sacrum,  monitor orthostatic BPs Required Braces or Orthoses: Other Brace Other Brace: abdominal binder Restrictions Weight Bearing Restrictions: No    Therapy/Group: Individual Therapy  Callie Fielding, [place] 07/01/2020, 7:59 AM

## 2020-07-01 NOTE — Progress Notes (Shared)
Occupational Therapy Discharge Summary  [name] MRN: [id number] Date of Birth: 03-05-1971    Patient has met 1 of 9 long term goals due to improved activity tolerance, improved balance, postural control and improved coordination.  Patient to discharge at [facility] Assist level.  Patient's care partner (SNF) is independent to provide the necessary physical assistance at discharge.  Pt has improved his ability to complete BADL and is overall Min A, completing LB bathing with Min A for buttocks only at bed level, set up for UB dress and Min A for LB dress bed level with rolling L/R to don over hips, supervision for bed mobility, sit to stands Max A at [employer]. The pt has been limited significantly by episodes of orthostatics causing intense dizziness/lightheadedness and nausea/vomiting. Pt's father's home not accessible via wheelchair. Pt requiring SNF placement at this time in order to improve standing balance and functional mobility in order to eventually go home with family support. Pt also with recent diagnosis of C Diff resulting in weakness and functional decline.   Reasons goals not met: Pt did not meet most of his goals d/t recent decline with C diff, orthostatic BP, and fatigue/feeling ill.   Recommendation:  Patient will benefit from ongoing skilled OT services in skilled nursing facility setting to continue to advance functional skills in the area of BADL and Reduce care partner burden.  Equipment: No equipment provided at this time. Will eventually need BSC for home use when able to complete transfer.   Reasons for discharge: lack of progress toward goals and discharge from hospital and appropriate for SNF placement  Patient/family agrees with progress made and goals achieved: Yes  OT Discharge Precautions/Restrictions  Precautions Precautions: Fall Precaution Comments: wounds on bilat feet and L sacrum, inconsistent orthostatic BPs Restrictions Weight  Bearing Restrictions: No  Pain Pain Assessment Pain Scale: 0-10 Pain Score: 0-No pain ADL ADL Eating: Set up Where Assessed-Eating: Bed level Grooming: Setup Where Assessed-Grooming: Sitting at sink,Wheelchair Upper Body Bathing: Setup Where Assessed-Upper Body Bathing: Edge of bed Lower Body Bathing: Minimal assistance Where Assessed-Lower Body Bathing: Bed level Upper Body Dressing: Setup Where Assessed-Upper Body Dressing: Edge of bed Lower Body Dressing: Minimal assistance Where Assessed-Lower Body Dressing: Bed level,Edge of bed Toileting: Minimal assistance (per staff report) Where Assessed-Toileting: Bed level Toilet Transfer: Not assessed (not assessed d/t medical status and incontinence) Vision Baseline Vision/History: No visual deficits Patient Visual Report: No change from baseline Cognition Overall Cognitive Status: Within Functional Limits for tasks assessed Arousal/Alertness: Awake/alert Orientation Level: Oriented X4 Safety/Judgment: Appears intact Sensation Coordination Gross Motor Movements are Fluid and Coordinated: No Fine Motor Movements are Fluid and Coordinated: Yes Coordination and Movement Description: coordination WFL but low tone/fatigue/generalized weakness limiting Motor  Motor Motor: Abnormal postural alignment and control Motor - Skilled Clinical Observations: grossly uncoordinated d/t decreased postural control and prolonged time in bed but improved from eval Mobility  Bed Mobility Bed Mobility: Rolling Right;Rolling Left;Sit to Sidelying Left;Left Sidelying to Sit Rolling Right: Supervision/verbal cueing Rolling Left: Supervision/Verbal cueing Right Sidelying to Sit: Supervision/Verbal cueing Left Sidelying to Sit: Supervision/Verbal cueing Supine to Sit: Supervision/Verbal cueing Sit to Supine: Supervision/Verbal cueing Sit to Sidelying Left: Supervision/Verbal cueing Transfers Sit to Stand: Dependent - mechanical lift (mod assist  w/stedy, unable max assist w/RW)  Trunk/Postural Assessment  Cervical Assessment Cervical Assessment: Within Functional Limits Thoracic Assessment Thoracic Assessment: Within Functional Limits Lumbar Assessment Lumbar Assessment: Exceptions to [facility] Postural Control Postural Control: Deficits on evaluation  Balance Balance Balance Assessed: Yes Static Sitting Balance Static Sitting - Balance Support: Feet supported Static Sitting - Level of Assistance: 5: Stand by assistance Static Sitting - Comment/# of Minutes: due to c/o dizzyness, no balance loss or increased sway w/activities. Dynamic Sitting Balance Dynamic Sitting - Balance Support: Feet supported;During functional activity Dynamic Sitting - Level of Assistance: 5: Stand by assistance Dynamic Sitting - Balance Activities: Lateral lean/weight shifting;Forward lean/weight shifting;Reaching across midline Sitting balance - Comments: Supervision sitting EOB d/t recent dizziness/lightheadedness Static Standing Balance Static Standing - Balance Support: Bilateral upper extremity supported Static Standing - Level of Assistance: 3: Mod assist Static Standing - Comment/# of Minutes: static standing approx 10 seconds at RW Extremity/Trunk Assessment RUE Assessment RUE Assessment: Within Functional Limits LUE Assessment LUE Assessment: Within Functional Limits   [name] 07/15/2020, 12:29 PM

## 2020-07-01 NOTE — Progress Notes (Signed)
Occupational Therapy Session Note  [name] MRN: [id number] Date of Birth: 20-May-1971  Today's Date: 07/01/2020 OT Individual Time: 8144-8185 OT Individual Time Calculation (min): 45 min  Missed 15 minutes d/t nausea/vomiting   Short Term Goals: Week 2:  OT Short Term Goal 1 (Week 2): Pt will tolerate sitting OOB for 2-3 hours OT Short Term Goal 1 - Progress (Week 2): Progressing toward goal OT Short Term Goal 2 (Week 2): Pt will complete LB bathing and dressing with Min A w/ AE PRN OT Short Term Goal 2 - Progress (Week 2): Met OT Short Term Goal 3 (Week 2): Pt will complete transfer to [facility] with LRAD with Min A OT Short Term Goal 3 - Progress (Week 2): Progressing toward goal OT Short Term Goal 4 (Week 2): Pt will perform sit to stand at [facility] with Max A of 1 consistently OT Short Term Goal 4 - Progress (Week 2): Met Week 3:  OT Short Term Goal 1 (Week 3): STGs=LTGs due to ELOS   Skilled Therapeutic Interventions/Progress Updates:    Pt greeted at time of session supine in bed, hand off from PT as pt had just had BM and changed at bed level. Pt agreeable to OT session despite not feeling well today. Discussion throughout session regarding DC planning for SNF as well as obstacles for home layout to return home and focus of future sessions. Donned pants with set up at bed level rolling L/R. BP in supine with ace wraps on 130/94 no symptoms. Supine > sit Supervision with increase in dizziness/lightheadedness, BP 110/79. Extended amount of time sitting EOB approx 20 minutes with continued dizziness/lightheadedness with pt performing AROM for BUEs for flexion, shoulder circles, overhead raises, etc as well as foot pumps. Symptoms continued. Binder placed, and BP 112/85. Pt returned self to supine, had episode of emesis and communicated with RN. Unable to further participate in OT session, missed 15 minutes d/t N/V.  Therapy Documentation Precautions:   Precautions Precautions: Fall Precaution Comments: wounds on bilat feet and L sacrum, monitor orthostatic BPs Required Braces or Orthoses: Other Brace Other Brace: abdominal binder Restrictions Weight Bearing Restrictions: No     Therapy/Group: Individual Therapy  [name] 07/01/2020, 12:44 PM

## 2020-07-01 NOTE — Progress Notes (Signed)
Per therapy patient vomitted ; RN first gave zofran iv but per pt site was painful; gave po zofran so far pt tolerated. Patient went to MRI per order. RN gave report to HD RN re: to give vancomycin per MD patient agreed to get this med and confirmed with patient. Patient also scheduled to get maxipime. HD RN made aware.

## 2020-07-01 NOTE — Progress Notes (Signed)
Attempted to contact [facility] and rehab and left another message. Have also contacted sister-melissa to ask her to go there and let them know they are interested in having pt go there for rehab. At least need an answer if can not offer a bed will move on to another facility. Sister to go by facility today and call this worker back regarding what they tell her.

## 2020-07-01 NOTE — Consult Note (Addendum)
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 I am not too impressed with the pelvis sacral decub ulcer. He does have ischemic ulcer in the bilateral feet that I am unable to see but per chart review no concerning sign of infection. cxr rather unremarkable and no s/s of pna as well (improving mild dry cough since covid). No other localizing infectious sx  Some diarrhea possibly due to abx otherwise low suspicion for cdiff. He does have hx cdiff on 11/4 at [facility] given 10 days vanc   Plan: 1. Continue vanc/cefepime awaiting sepsis w/u 2. f/u bcx and mri pelvis 3. Will examine feet and f/u with wound team if beign seen 4. In abd pain ongoing n/v & diarrhea, and no other source, consider sendign cdiff testing  Principal Problem:   Critical illness myopathy Active Problems:   Anemia of chronic disease   ESRD on dialysis ([place])   Diabetic peripheral neuropathy (HCC)   Labile blood glucose   Essential hypertension   Constipation   Scheduled Meds: . (feeding supplement) PROSource Plus  30 mL Oral TID BM  . amLODipine  7.5 mg Oral QHS  . Chlorhexidine Gluconate Cloth  6 each Topical BID  . darbepoetin (ARANESP) injection - DIALYSIS  200 mcg Intravenous Q Tue-HD  . famotidine  20 mg Oral QHS  . feeding supplement (NEPRO CARB STEADY)  237 mL Oral TID BM  . heparin injection (subcutaneous)  7,500 Units  Subcutaneous Q8H  . insulin aspart  0-9 Units Subcutaneous TID WC  . insulin glargine  11 Units Subcutaneous QHS  . lanthanum  500 mg Oral TID WC  . multivitamin  1 tablet Oral QHS  . pneumococcal 23 valent vaccine  0.5 mL Intramuscular Tomorrow-1000  . polyethylene glycol  17 g Oral Daily   Continuous Infusions: . ceFEPime (MAXIPIME) IV    . vancomycin    . vancomycin     PRN Meds:.acetaminophen, bisacodyl, hydrALAZINE, loperamide, ondansetron **OR** ondansetron (ZOFRAN) IV, prochlorperazine  HPI: Donnel Jobin is a 50 y.o. male pmh obesity, hypertension, dm2, ESRD on iHD via right ij HD catheter, recent covid infection, with debility associated sacral decub ulcer complicated by OM (s/p 6 weeks vanc/cefepime by 06/10/20), now in rehab with sepsis  Hx via patient and chart review Unclear if patient is a good historian  Patient was admitted 04/29/20 from [facility] facility for failure to thrive of a few days, sob, cough, fever, loss of tast/smell, and sacral pain (has had sacral wound for a few months? Prior to admission). Also tested positive for COVID-19 on November 30; not previously vaccinated.  mild disease only for covid. Had mri pelvis that suggest imaging evidence sacral OM. Also had MRIs of bilateral feet at that time; Left foot MRI showed an area of ulceration  with nailbed irregularity at the first digit with findings that are suggestive of osteomyelitis involving the first digit distal tuft; MRI right foot showed abnormal osseous edema in the head of the fifth metatarsal with underlying cutaneous and subcutaneous ulceration, highly suspicious for osteomyelitis.  no surgical I&D needed of sacral wound. Had ABIs that showed no evidence of significant right lower extremity arterial disease, but left ABI mild left lower extremity arterial disease. Blood culture negative. ID consulted and suggested empiric tx 6 weeks vanc/cefepime until 06/10/20.   He was transferred to inpatient  rehab on 1/16. Had been doing well. No id f/u yet. His crp had not normalized and remains int he mid 6's by 06/28/2020  On 2/08 patient had fever 100.9, so sepsis w/u started and placed on vanc/cefepime and ID consulted. cxr similar miimal patchy left basilar opacity likely atelectasis. Patient not coughing or sob. Mild leukocytosis 11 on 2/8 but quickly normalized the next day. However, procalcitonin is rising   He reporst outside of the low grade fever on 2/8 and mild cough that is improving since covid dx, he is feeling well. The ulcer sacrum is small and improving. He denies having pain in the feet or redness. Since abx started 2 days ago he reports watery diarrhea. His appetite is intact. No n/v/abd pain  No other rash  Michela Pitcher he has been on dialysis for at least 4 months.   As for his sacral decub ulcer, he said since his gall bladder surgery he has been weak and bed bound leadign to the ulcer  ---------- Reviewed [facility] note  Patient had admission 01/2020 and 03/2020. Initially for aki requiring hd, complicated by cholecystitis. Returned for definitive surgical managemetn cholecystitis and developed cdiff given 10 days vanc. He has been debilitated since these admissions and developed pressure ulcers due to that    Review of Systems: ROS Other ros negative  Past Medical History:  Diagnosis Date  . Chronic kidney disease    HD MWF  . Diabetes mellitus without complication ([place])   . Hypertension     Social History   Tobacco Use  . Smoking status: Never Smoker  . Smokeless tobacco: Never Used  Substance Use Topics  . Alcohol use: Never  . Drug use: Never    History reviewed. No pertinent family history. Allergies  Allergen Reactions  . Atorvastatin Hives    NOT on MAR    OBJECTIVE: Blood pressure (!) 184/95, pulse 95, temperature 98.4 F (36.9 C), temperature source Oral, resp. rate 16, height '6\' 6"'$  (1.981 m), weight 119 kg, SpO2 99 %.  Physical  Exam No distress, getting dialysis, conversant Heent: normocephalic; per; conj clear; eomi Neck supple cv rrr no mrg Lungs clear abd s/nt Ext in compression wrap bilaterally Skin sacral ulcer quarter size opening but tracking deep; serosanguinous output; no able to visualize the feet on today's exam (will revisit when he is in his room) msk no back spine or peripheral joint synovitis Neuro strength bilateral LE 4/5 symmetric, cn2-12 intact Psych alert/oriented   Catheter site no tenderness, redness, fluctuance  Lab Results Lab Results  Component Value Date   WBC 7.5 06/30/2020   HGB 9.8 (L) 06/30/2020   HCT 30.9 (L) 06/30/2020   MCV 83.5 06/30/2020   PLT 272 06/30/2020    Lab Results  Component Value Date   CREATININE 7.51 (H) 06/29/2020   BUN 63 (H) 06/29/2020   NA 133 (L) 06/29/2020   K 4.8 06/29/2020   CL  96 (L) 06/29/2020   CO2 25 06/29/2020    Lab Results  Component Value Date   ALT 18 05/08/2020   AST 18 05/08/2020   ALKPHOS 274 (H) 05/08/2020   BILITOT 0.6 05/08/2020     Microbiology: Recent Results (from the past 240 hour(s))  Culture, blood (routine x 2)     Status: None (Preliminary result)   Collection Time: 06/29/20 10:59 PM   Specimen: BLOOD  Result Value Ref Range Status   Specimen Description BLOOD SITE NOT SPECIFIED  Final   Special Requests   Final    BOTTLES DRAWN AEROBIC AND ANAEROBIC Blood Culture adequate volume   Culture   Final    NO GROWTH 1 DAY Performed at [facility], [place] [address] [place], [place] [address]    Report Status PENDING  Incomplete  Culture, blood (routine x 2)     Status: None (Preliminary result)   Collection Time: 06/29/20 11:04 PM   Specimen: BLOOD  Result Value Ref Range Status   Specimen Description BLOOD SITE NOT SPECIFIED  Final   Special Requests   Final    BOTTLES DRAWN AEROBIC AND ANAEROBIC Blood Culture adequate volume   Culture   Final    NO GROWTH 1 DAY Performed at [facility],  [place] [address] [place], [place] [address]    Report Status PENDING  Incomplete  Aerobic Culture (superficial specimen)     Status: None (Preliminary result)   Collection Time: 06/30/20 12:13 AM   Specimen: Sacral  Result Value Ref Range Status   Specimen Description SACRAL  Final   Special Requests NONE  Final   Gram Stain NO WBC SEEN NO ORGANISMS SEEN   Final   Culture   Final    CULTURE REINCUBATED FOR BETTER GROWTH Performed at [facility], [address], [place], [place] [address]    Report Status PENDING  Incomplete    Imaging: 2/10 mr pelvis pending  2/08 cxr Reviewed (personal read no obvious opacity; right HD cath in proper position) Similar minimal patchy left basilar opacity likely atelectasis.  Jabier Mutton, [place] for Infectious [place] [phone] pager    07/01/2020, 9:23 AM

## 2020-07-01 NOTE — Progress Notes (Signed)
Pam PA notified of MRI results and Infectious Disease recommendations.

## 2020-07-01 NOTE — Progress Notes (Signed)
Secure chatted with Dr. Vu--to wait 1-2 days prior to ordering C diff (persistent or resolves)

## 2020-07-01 NOTE — Progress Notes (Signed)
Subjective: Seen on dialysis states diarrhea is back, and now agrees to take vancomycin IV, noted rehab team  ordered MRI abdomen to evaluate fever.  Objective Vital signs in last 24 hours: Vitals:   07/01/20 1230 07/01/20 1300 07/01/20 1330 07/01/20 1400  BP: (!) 151/91 (!) 144/89 139/77 132/74  Pulse: 82 86  86  Resp:      Temp:      TempSrc:      SpO2:      Weight:      Height:       Weight change:   Physical Exam: General: Alert obese chronically ill male NAD Heart: RRR, on hemodialysis pulse 82, no MRG Lungs: CTA anteriorly, nonlabored breathing Abdomen: Obese, bowel sounds normoactive, soft NT protuberant Extremities: Bilateral lower legs with Ace wrap, some edema noted in thighs Dialysis Access: IJ PermCath patent on hemodialysis  Dialysis Orders: NWTTS 4h 400/800 143.5kg 3K/2.5Ca TDCHeparin3000units IV TIW - Mircera132mgIVq2wks - last 12/8 - Venofer '100mg'$ IVqHD x10 - completed 6/10 doses  Problem/Plan: 1. Debilitation with history of prolonged hospitalization COVID 04/20/2020 sacral decub/coccygealosteo:S/p 6 weeks ofVanc/Cefepime (finished1/20/22) - In CIR. 2. ESRDHD TIN:[id number] usual TTS schedule.Last reported didtolerate HD well without dizziness.Only 2-1/2 HD treatment due Vast ^^patient load 3. 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 Low-grade temperature= blood cultures pending, chest x-ray minimal patchy left basilar opacity likely atelectasis, PermCath site looks stable no fever spikes on dialysis work-up per admit team, MRI ordered by admit team 5. Diarrhea -Recurrent issue, initially thought d/t Renvelawhich has now been stoppedbut still having issues with diarrhea on Fosrenol. Primary team suspects poor sphincter tone 6. BP/volume:Overall BP control is improved.  Noted elevated today earlier but now on dialysis stable .has haddizziness  consistently associated with lower blood  pressuresbut he did have + orthostatic hypotension2/06.  We have  let volume come up a bit, 0.5 L UF with HDtoday keep systolic BP over [id number] is not effective in reducing dizziness, may need to decrease amlodipine 7 .5 mg nightly dosage further..  7. Anemiaof CKD: Hgb9.8 cont Aranesp 2053mq Sat.,    Noted transferrin sat low at 8% give IV Venofer load on dialysis 8. Secondary Hyperparathyroidism:CorrCaelevated,use low Ca bath.Not on VDRA.onFosrenolwithPO44.2 but still with diarrhea, not convinced binders are the issue since he has also had periods of constipation while on fosrenol.PO4 at goal. 9. Nutrition:Alb2.1 remains low, continue Nepro. 10. DMT2 - per admit   DaErnest HaberPA-C [facility] [phone] 9825/02/2021,2:33 PM  LOS: 25 days   Labs: Basic Metabolic Panel: Recent Labs  Lab 06/28/20 0537 06/29/20 1645 07/01/20 1229  NA 134* 133* 134*  K 4.2 4.8 4.5  CL 99 96* 98  CO2 '25 25 25  '$ GLUCOSE 103* 119* 110*  BUN 45* 63* 47*  CREATININE 6.10* 7.51* 6.79*  CALCIUM 9.7 9.9 9.0  PHOS 4.2 4.7* 4.2   Liver Function Tests: Recent Labs  Lab 06/28/20 0537 06/29/20 1645 07/01/20 1229  ALBUMIN 2.2* 2.3* 2.1*   No results for input(s): LIPASE, AMYLASE in the last 168 hours. No results for input(s): AMMONIA in the last 168 hours. CBC: Recent Labs  Lab 06/26/20 1709 06/28/20 0537 06/29/20 1645 06/30/20 0513 07/01/20 1229  WBC 8.1 9.3 10.9* 7.5 10.5  NEUTROABS  --  5.7  --  4.8  --   HGB 10.0* 10.9* 10.5* 9.8* 9.8*  HCT 33.7* 34.0* 34.5* 30.9* 32.4*  MCV 86.0 84.0 83.9 83.5 83.9  PLT 286 330 323  272 311   Cardiac Enzymes: No results for input(s): CKTOTAL, CKMB, CKMBINDEX, TROPONINI in the last 168 hours. CBG: Recent Labs  Lab 06/30/20 1503 06/30/20 1654 06/30/20 2126 07/01/20 0632 07/01/20 1152  GLUCAP 168* 160* 130* 106* 121*     Medications: . ceFEPime (MAXIPIME) IV    . vancomycin 1,000 mg  (07/01/20 1347)  . vancomycin     . (feeding supplement) PROSource Plus  30 mL Oral TID BM  . amLODipine  7.5 mg Oral QHS  . Chlorhexidine Gluconate Cloth  6 each Topical BID  . darbepoetin (ARANESP) injection - DIALYSIS  200 mcg Intravenous Q Tue-HD  . famotidine  20 mg Oral QHS  . feeding supplement (NEPRO CARB STEADY)  237 mL Oral TID BM  . heparin injection (subcutaneous)  7,500 Units Subcutaneous Q8H  . heparin sodium (porcine)      . insulin aspart  0-9 Units Subcutaneous TID WC  . insulin glargine  11 Units Subcutaneous QHS  . lanthanum  500 mg Oral TID WC  . multivitamin  1 tablet Oral QHS  . pneumococcal 23 valent vaccine  0.5 mL Intramuscular Tomorrow-1000  . polyethylene glycol  17 g Oral Daily

## 2020-07-01 NOTE — Progress Notes (Signed)
[place] PHYSICAL MEDICINE & REHABILITATION PROGRESS NOTE   Subjective/Complaints:  Pt vomited last night- 100cc emesis with meds in it having sweats and chills per pt and feels awful-  Declined Vanc Tuesday night- explained that medicine is likely the meds required to stop his Sx's- he's afraid because of it causing diarrhea, but explained we can treat diarrhea.     ROS:   Pt denies SOB, abd pain, CP, N/V/C/D, and vision changes  Objective:   DG Chest 2 View  Result Date: 06/29/2020 CLINICAL DATA:  Fever and chills EXAM: CHEST - 2 VIEW COMPARISON:  Chest radiograph June 16, 2020 FINDINGS: Right IJ CVC with tip overlying the superior cavoatrial junction. The heart size and mediastinal contours are unchanged. Slightly reduced lung volumes. Similar minimal patchy left basilar opacity. No new focal consolidation. The visualized skeletal structures are unchanged. IMPRESSION: Similar minimal patchy left basilar opacity likely atelectasis. Electronically Signed   By: Dahlia Bailiff MD   On: 06/29/2020 21:47   Recent Labs    06/29/20 1645 06/30/20 0513  WBC 10.9* 7.5  HGB 10.5* 9.8*  HCT 34.5* 30.9*  PLT 323 272   Recent Labs    06/29/20 1645  NA 133*  K 4.8  CL 96*  CO2 25  GLUCOSE 119*  BUN 63*  CREATININE 7.51*  CALCIUM 9.9    Intake/Output Summary (Last 24 hours) at 07/01/2020 0926 Last data filed at 07/01/2020 0548 Gross per 24 hour  Intake 480 ml  Output 1125 ml  Net -645 ml     Pressure Injury 05/01/20 Buttocks Left Stage 4 - Full thickness tissue loss with exposed bone, tendon or muscle. packed with santyl gauze, dressed by WOCN, (Active)  05/01/20 0230  Location: Buttocks  Location Orientation: Left  Staging: Stage 4 - Full thickness tissue loss with exposed bone, tendon or muscle.  Wound Description (Comments): packed with santyl gauze, dressed by WOCN,  Present on Admission: Yes     Pressure Injury 05/01/20 Ankle Left;Lateral Unstageable - Full  thickness tissue loss in which the base of the injury is covered by slough (yellow, tan, gray, green or brown) and/or eschar (tan, brown or black) in the wound bed. smal circular wound on the (Active)  05/01/20 0230  Location: Ankle  Location Orientation: Left;Lateral  Staging: Unstageable - Full thickness tissue loss in which the base of the injury is covered by slough (yellow, tan, gray, green or brown) and/or eschar (tan, brown or black) in the wound bed.  Wound Description (Comments): smal circular wound on the bone  Present on Admission: Yes     Pressure Injury 05/01/20 Foot Left;Posterior Unstageable - Full thickness tissue loss in which the base of the injury is covered by slough (yellow, tan, gray, green or brown) and/or eschar (tan, brown or black) in the wound bed. 4th and 5th toes appear n (Active)  05/01/20 0230  Location: Foot (4th and 5th toes and lateral/anterior side of the sole)  Location Orientation: Left;Posterior  Staging: Unstageable - Full thickness tissue loss in which the base of the injury is covered by slough (yellow, tan, gray, green or brown) and/or eschar (tan, brown or black) in the wound bed.  Wound Description (Comments): 4th and 5th toes appear necrotic circular wound on sole  Present on Admission: Yes     Pressure Injury 05/01/20 Foot Right;Medial;Posterior Unstageable - Full thickness tissue loss in which the base of the injury is covered by slough (yellow, tan, gray, green or brown) and/or eschar (  tan, brown or black) in the wound bed. large circular wo (Active)  05/01/20 0230  Location: Foot  Location Orientation: Right;Medial;Posterior  Staging: Unstageable - Full thickness tissue loss in which the base of the injury is covered by slough (yellow, tan, gray, green or brown) and/or eschar (tan, brown or black) in the wound bed.  Wound Description (Comments): large circular wound on sole  and heel  Present on Admission: Yes     Pressure Injury 05/08/20 Heel  Right Unstageable - Full thickness tissue loss in which the base of the injury is covered by slough (yellow, tan, gray, green or brown) and/or eschar (tan, brown or black) in the wound bed. (Active)  05/08/20 1718  Location: Heel  Location Orientation: Right  Staging: Unstageable - Full thickness tissue loss in which the base of the injury is covered by slough (yellow, tan, gray, green or brown) and/or eschar (tan, brown or black) in the wound bed.  Wound Description (Comments):   Present on Admission: Yes    Physical Exam: Vital Signs Blood pressure (!) 184/95, pulse 95, temperature 98.4 F (36.9 C), temperature source Oral, resp. rate 16, height 6' 6" (1.981 m), weight 119 kg, SpO2 99 %. Constitutional: awake, more alert, got hair cut, laying on side in bed; pale color more than normal, NAD HENT: Normocephalic.  Atraumatic. Eyes: EOMI. No discharge. Cardiovascular: borderline tachycardia- regular rhythm Respiratory: CTA B/L- no W/R/R- good air movement- sounds clear still in spite of little cough occ.  GI: soft, NT, somewhat distended appearing vs protuberant, hypoactive BS Skin: pressure ulcers not examined since working with therapy Psych: appears that feels ill.   Extremities: edema in extremities.  No tenderness in extremities. Neuro: Alert Motor: Bilateral upper extremities: 5/5 proximal distal Bilateral lower extremities: 4 -/5 proximal distal (?  Participation) Skin: L foot- has ruptured blood blister 5th MTP base; also 4th toe is dry gangrene- all black eschar.   Assessment/Plan: 1. Functional deficits which require 3+ hours per day of interdisciplinary therapy in a comprehensive inpatient rehab setting.  Physiatrist is providing close team supervision and 24 hour management of active medical problems listed below.  Physiatrist and rehab team continue to assess barriers to discharge/monitor patient progress toward functional and medical goals  Care Tool:  Bathing     Body parts bathed by patient: Right arm,Left arm,Chest,Abdomen,Right upper leg,Left upper leg,Face,Right lower leg,Left lower leg   Body parts bathed by helper: Front perineal area,Buttocks     Bathing assist Assist Level: Minimal Assistance - Patient > 75%     Upper Body Dressing/Undressing Upper body dressing   What is the patient wearing?: Pull over shirt    Upper body assist Assist Level: Set up assist    Lower Body Dressing/Undressing Lower body dressing      What is the patient wearing?: Pants     Lower body assist Assist for lower body dressing: Set up assist     Toileting Toileting    Toileting assist Assist for toileting: Maximal Assistance - Patient 25 - 49% Assistive Device Comment:  (urinal)   Transfers Chair/bed transfer  Transfers assist  Chair/bed transfer activity did not occur: Safety/medical concerns (Per nursing report)  Chair/bed transfer assist level: Minimal Assistance - Patient > 75% Chair/bed transfer assistive device: Sliding board   Locomotion Ambulation   Ambulation assist   Ambulation activity did not occur: Safety/medical concerns (fatigue, lightheadedness, generalized weakness)  Assist level: Minimal Assistance - Patient > 75% Assistive device: Parallel bars Max distance:  24 ft   Walk 10 feet activity   Assist  Walk 10 feet activity did not occur: Safety/medical concerns (fatigue, lightheadedness, generalized weakness)  Assist level: Minimal Assistance - Patient > 75% Assistive device: Parallel bars   Walk 50 feet activity   Assist Walk 50 feet with 2 turns activity did not occur: Safety/medical concerns (fatigue, lightheadedness, generalized weakness)         Walk 150 feet activity   Assist Walk 150 feet activity did not occur: Safety/medical concerns (fatigue, lightheadedness, generalized weakness)         Walk 10 feet on uneven surface  activity   Assist Walk 10 feet on uneven surfaces activity did  not occur: Safety/medical concerns (fatigue, lightheadedness, generalized weakness)         Wheelchair     Assist Will patient use wheelchair at discharge?: Yes Type of Wheelchair: Manual    Wheelchair assist level: Supervision/Verbal cueing Max wheelchair distance: 100    Wheelchair 50 feet with 2 turns activity    Assist        Assist Level: Supervision/Verbal cueing   Wheelchair 150 feet activity     Assist      Assist Level: Minimal Assistance - Patient > 75%   Blood pressure (!) 184/95, pulse 95, temperature 98.4 F (36.9 C), temperature source Oral, resp. rate 16, height 6' 6" (1.981 m), weight 119 kg, SpO2 99 %.  Medical Problem List and Plan: 1.  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  Antithrombotics: -DVT/anticoagulation: Subcutaneous heparin.               -antiplatelet therapy: N/A 3. Pain Management: Tylenol as needed  2/8- pain controlled- con't regimen 4. Mood: Provide emotional support             -antipsychotic agents: N/A 5. Neuropsych: This patient is? capable of making decisions on his own behalf. 6. Skin/Wound Care: Routine skin checks 7. Fluids/Electrolytes/Nutrition: Routine in and outs with follow-up chemistries 8.  ESRD.  Hemodialysis per renal services 9.  Sacral decubitus ulcer as well as ischemic ulcers on the left foot fourth toe and fifth metatarsal head and right heel and fifth metatarsal head.  Wound care nurse follow-up.  Patient to see Dr. Sharol Given outpatient.  Continue bilateral PRAFO's. 10.  ID/ coccygeal osteomyelitis.    Completed vancomycin and cefepime on 06/10/2020  1/18- will schedule turning q2 hours to get off wounds- also will make sure on specialty bed  1/20- is on specialty bed  Repeat labs ordered for Monday  2/7- CRP 6.5- ESR pending - con't regimen  2/8- ESR  DOWN TO 67 FROM 73- SO HEADING IN RIGHT DIRECTION  2/9- Spiked temp last night of 100.9- restarted  Vanc/Cefipime- pt refused Vanc "since causes diarrhea". WBC now normal again this AM- will con't for now. CXR (-); is anuric-  2/10- having fevers/sweats/chills- Tm 99.4- needs to also take Vanc- told this to pt- also, called ID and also ordered another CT of pelvis due to Sx's-   10.  Anemia of chronic disease.  Continue Aranesp.   Hemoglobin 10.0 on 2/3, labs with HD 11.  Diabetes mellitus with peripheral neuropathy .  Hemoglobin A1c 5.6.  Lantus insulin 11 units nightly  CBG (last 3)  Recent Labs    06/30/20 1654 06/30/20 2126 07/01/20 0632  GLUCAP 160* 130* 106*   2/10- BGs 106-140- con't regimen 12.  Morbid obesity.  BMI 38.37.  Dietary follow-up 13. Dizziness/nausea with  HTN and hypotension  Vitals:   06/30/20 1948 07/01/20 0542  BP: (!) 137/101 (!) 184/95  Pulse: (!) 109 95  Resp: 16 16  Temp: 99.1 F (37.3 C) 98.4 F (36.9 C)  SpO2: 98% 99%    Hydralazine prn   Norvasc to 7.5 mg daily   2/9- BP 156/85 this AM- but has been controlled last few days  2/10- BP back up- likely due to illness- also is tachycardic which is new- mild- will d/w renal  14. Diarrhea with new constipation  Bowel meds increased on 2/5  2/7- will decrease bowel meds again since 8+ BMs in last 24 hours- con't regimen 15. Leukocytosis: Resolved  ESR/CRP and CBC with diff ordered for Monday.  2/7- CRP 6.5- stable- con't regimen   2/8- EST 67- down to 73- con't regimen   16. N/Vomiting-  2/10- likely due to underlying illness- will do ID w/u as above.  16. Dispo  2/8- pt unhappy that has to back to SNF- however has been participating intermittently- finally doing more/more interactive, but we can only keep max of 30 days. Sent FL2  2/9- pt refused PT yesterday -only bedlevel Exercises- pt needs max encouragement to progress with therapy   2/10- might have to hold d/c- we will see.   LOS: 25 days A FACE TO FACE EVALUATION WAS PERFORMED  Vernadine Coombs 07/01/2020, 9:26 AM

## 2020-07-01 NOTE — Progress Notes (Signed)
Physical Therapy Session Note  [name] MRN: [id number] Date of Birth: 03/03/1971  Today's Date: 07/01/2020 PT Individual Time: 0802-0900 PT Individual Time Calculation (min): 58 min   Short Term Goals: Week 1:  PT Short Term Goal 1 (Week 1): pt will perform all bed mobility with CGA PT Short Term Goal 1 - Progress (Week 1): Not met PT Short Term Goal 2 (Week 1): Pt will transfer sit<>stand with LRAD and mod A PT Short Term Goal 2 - Progress (Week 1): Not met PT Short Term Goal 3 (Week 1): Pt will transfer bed<>chair with LRAD and min A PT Short Term Goal 3 - Progress (Week 1): Not met Week 2:  PT Short Term Goal 1 (Week 2): Pt will transfer bed<>chair with LRAD and min A PT Short Term Goal 1 - Progress (Week 2): Progressing toward goal PT Short Term Goal 2 (Week 2): Pt will transfer sit<>stand with LRAD and mod A PT Short Term Goal 2 - Progress (Week 2): Met PT Short Term Goal 3 (Week 2): pt will perform all bed mobility with CGA PT Short Term Goal 3 - Progress (Week 2): Progressing toward goal PT Short Term Goal 4 (Week 2): pt will recall pressure relief schedule and safe techniques with pressure relief PT Short Term Goal 4 - Progress (Week 2): Met PT Short Term Goal 5 (Week 2): pt will tolerate sitting OOB for 45 mins without adverse effects PT Short Term Goal 5 - Progress (Week 2): Met Week 3:  PT Short Term Goal 1 (Week 3): Pt will transfer bed<>chair with LRAD and min A PT Short Term Goal 1 - Progress (Week 3): Met (with slide board, mod A with RW) PT Short Term Goal 2 (Week 3): Pt will transfer sit<>stand with LRAD and mod A from WC PT Short Term Goal 2 - Progress (Week 3): Met PT Short Term Goal 3 (Week 3): pt will perform all bed mobility with CGA PT Short Term Goal 3 - Progress (Week 3): Met Week 4:  PT Short Term Goal 1 (Week 4): pt to demonstrate min A STS from various surfaces with RW PT Short Term Goal 2 (Week 4): pt to demonstrate gait  training with RW mod A for 15'  Skilled Therapeutic Interventions/Progress Updates:    pt received in bed and agreeable to therapy but reported feeling wet from sweating throughout the night, requested to change clothes. Pt reported feeling unwell with coughing, sweating, overall soreness, with overnight diarrhea and vomiting this AM. Pt setup for donning/doffing shirt and min A for pants management. Pt total A for donning/doffing socks all at bed level. Pt agreeable to sit EOB, BLE ace wrap and binder donned max A, pt reported feeling lightheaded, BP taken to be 132/83 heart rate 93. Pt sat EOB for several minutes until relief reported. Pt then agreeable to attempting Sit to stand to Rolling walker, max A provided with pt unable to achieve full standing x3 attempts. Pt very upset and disappointed. Pt encouraged to attempt slide board transfer to [facility] to at least be out of bed. Pt agreeable, assistance for board placement and CGA for slide board transfer. Pt requesting to wash face and brush teeth. Pt directed in WC mobility to sink SBA, and setup A for these tasks. Pt denied dizziness or lightheadedness. Pt agreeable to sitting in WC, however post teeth brushing pt began to cough lightly then reported he had incontinent BM, pt able to navigate WC SBA  for setup for slide board transfer back to bed, CGA for slide board transfer, and CGA sit>supine. Pt directed in rolling for doffing pants and brief mod I, total A for pericare and max A for donning new brief. OT entered room at end of session and pt handed off to OT for start of their session.   Therapy Documentation Precautions:  Precautions Precautions: Fall Precaution Comments: wounds on bilat feet and L sacrum, monitor orthostatic BPs Required Braces or Orthoses: Other Brace Other Brace: abdominal binder Restrictions Weight Bearing Restrictions: No General:   Vital Signs: Therapy Vitals Temp: 98.4 F (36.9 C) Temp Source: Oral Pulse Rate:  95 Resp: 16 BP: (!) 184/95 Patient Position (if appropriate): Lying Oxygen Therapy SpO2: 99 % O2 Device: Room Air Pain:   Mobility:   Locomotion :    Trunk/Postural Assessment :    Balance:   Exercises:   Other Treatments:      Therapy/Group: Individual Therapy  [name] 07/01/2020, 9:13 AM

## 2020-07-02 DIAGNOSIS — Z8619 Personal history of other infectious and parasitic diseases: Secondary | ICD-10-CM

## 2020-07-02 DIAGNOSIS — R197 Diarrhea, unspecified: Secondary | ICD-10-CM

## 2020-07-02 LAB — AEROBIC CULTURE W GRAM STAIN (SUPERFICIAL SPECIMEN): Gram Stain: NONE SEEN

## 2020-07-02 LAB — GLUCOSE, CAPILLARY
Glucose-Capillary: 97 mg/dL (ref 70–99)
Glucose-Capillary: 177 mg/dL — ABNORMAL HIGH (ref 70–99)
Glucose-Capillary: 149 mg/dL — ABNORMAL HIGH (ref 70–99)
Glucose-Capillary: 129 mg/dL — ABNORMAL HIGH (ref 70–99)

## 2020-07-02 MED ORDER — AMLODIPINE BESYLATE 5 MG PO TABS
5.0000 mg | ORAL_TABLET | Freq: Every day | ORAL | Status: DC
  Administered 2020-07-02 – 2020-07-15 (×14): 5 mg via ORAL
  Filled 2020-07-02 (×14): qty 1

## 2020-07-02 MED ORDER — CHLORHEXIDINE GLUCONATE CLOTH 2 % EX PADS
6.0000 | MEDICATED_PAD | Freq: Every day | CUTANEOUS | Status: DC
  Administered 2020-07-06 – 2020-07-14 (×6): 6 via TOPICAL

## 2020-07-02 NOTE — Progress Notes (Signed)
Occupational Therapy Session Note  [name] MRN: [id number] Date of Birth: Dec 07, 1970  Today's Date: 07/02/2020 OT Individual Time: 1002-1101 OT Individual Time Calculation (min): 59 min   Short Term Goals: Week 3:  OT Short Term Goal 1 (Week 3): STGs=LTGs due to ELOS  Skilled Therapeutic Interventions/Progress Updates:    Pt greeted in bed with no c/o pain. Per communications with SW, today is not grad day. Pt reported being limited by dizziness during his previous PT session, agreeable to participate in tx as best as he was able. Pt already wearing compression garments, supine BP: 95/83. Once he transitioned EOB BP 111/79. Max A for sit<stand in [place] with pt able to stand for ~30 seconds before needing to sit down again due to "moderate" dizziness. BP: 107/91. On 2nd stand with the same assistance for power up from very elevated bed pt began "seeing grey" after 20 seconds and returned to sitting again. He had to lean onto his elbow to manage his dizziness at this time. BP: 101/65. Pt wanted to stand one last time and was able to stand for 20 seconds before reporting moderate-maximum dizziness. BP: 118/94 while seated. He completed seated flutter kicks x2 sets 1 minute holds to work on core and LE strengthening before returning to bed. Education throughout provided to pt in regards to completing daily exercise/stretching in bed to prevent muscle atrophy + decreased flexibility. At end of session pt remained in bed with all needs within reach. Tx focus placed on tolerate of positional changes and sit<stands for functional transfer training.   Therapy Documentation Precautions:  Precautions Precautions: Fall Precaution Comments: wounds on bilat feet and L sacrum, inconsistent orthostatic BPs Required Braces or Orthoses: Other Brace Other Brace: abdominal binder PRN with standing/strenuous activity Restrictions Weight Bearing Restrictions: No   Vital Signs: Therapy  Vitals Temp: 99.5 F (37.5 C) Temp Source: Oral Pulse Rate: 92 Resp: 16 BP: (!) 156/91 Patient Position (if appropriate): Lying Oxygen Therapy SpO2: 100 % O2 Device: Room Air ADL: ADL Eating: Set up (per staff report) Where Assessed-Eating: Bed level Grooming: Modified independent Where Assessed-Grooming: Sitting at sink,Wheelchair Upper Body Bathing: Setup Where Assessed-Upper Body Bathing: Edge of bed Lower Body Bathing: Minimal assistance Where Assessed-Lower Body Bathing: Bed level Upper Body Dressing: Setup Where Assessed-Upper Body Dressing: Sitting at sink,Wheelchair Lower Body Dressing: Setup Where Assessed-Lower Body Dressing: Bed level Toileting: Maximal assistance Where Assessed-Toileting: Bed level Toilet Transfer: Not assessed (not assessed d/t safety)     Therapy/Group: Individual Therapy  [name] [name] 07/02/2020, 3:05 PM

## 2020-07-02 NOTE — Progress Notes (Signed)
Physical Therapy Session Note  [name] MRN: [id number] Date of Birth: 1971-03-22  Today's Date: 07/02/2020 PT Individual Time: 0835-0900 PT Individual Time Calculation (min): 25 min   Short Term Goals: Week 4:  PT Short Term Goal 1 (Week 4): pt to demonstrate min A STS from various surfaces with RW PT Short Term Goal 2 (Week 4): pt to demonstrate gait training with RW mod A for 15'  Skilled Therapeutic Interventions/Progress Updates:    Patient asleep in supine.  Aroused easily and requesting to get dressed.  Patient washed face in supine with set up assist.  Noted soiled brief so pt rolled in bed for total A for hygiene and brief change. Patient donned pants in supine with set up.  Patient supine to sit with S and donned shirt at EOB with set up.  Applied ace wraps to lower legs in supine and binder when sitting.  Patient performed slide board transfer to w/c with min A.  Patient up in chair with breakfast tray and call bell in reach.   Therapy Documentation Precautions:  Precautions Precautions: Fall Precaution Comments: wounds on bilat feet and L sacrum, inconsistent orthostatic BPs Required Braces or Orthoses: Other Brace Other Brace: abdominal binder PRN with standing/strenuous activity Restrictions Weight Bearing Restrictions: No  Pain: Pain Assessment Pain Score: 0-No pain   Therapy/Group: Individual Therapy  Reginia Naas  Magda Kiel, PT 07/02/2020, 11:36 AM

## 2020-07-02 NOTE — Progress Notes (Signed)
Physical Therapy Session Note  [name] MRN: [id number] Date of Birth: 06/17/1970  Today's Date: 07/02/2020 PT Individual Time: 1400-1500 PT Individual Time Calculation (min): 60 min   Short Term Goals: Week 1:  PT Short Term Goal 1 (Week 1): pt will perform all bed mobility with CGA PT Short Term Goal 1 - Progress (Week 1): Not met PT Short Term Goal 2 (Week 1): Pt will transfer sit<>stand with LRAD and mod A PT Short Term Goal 2 - Progress (Week 1): Not met PT Short Term Goal 3 (Week 1): Pt will transfer bed<>chair with LRAD and min A PT Short Term Goal 3 - Progress (Week 1): Not met Week 2:  PT Short Term Goal 1 (Week 2): Pt will transfer bed<>chair with LRAD and min A PT Short Term Goal 1 - Progress (Week 2): Progressing toward goal PT Short Term Goal 2 (Week 2): Pt will transfer sit<>stand with LRAD and mod A PT Short Term Goal 2 - Progress (Week 2): Met PT Short Term Goal 3 (Week 2): pt will perform all bed mobility with CGA PT Short Term Goal 3 - Progress (Week 2): Progressing toward goal PT Short Term Goal 4 (Week 2): pt will recall pressure relief schedule and safe techniques with pressure relief PT Short Term Goal 4 - Progress (Week 2): Met PT Short Term Goal 5 (Week 2): pt will tolerate sitting OOB for 45 mins without adverse effects PT Short Term Goal 5 - Progress (Week 2): Met Week 3:  PT Short Term Goal 1 (Week 3): Pt will transfer bed<>chair with LRAD and min A PT Short Term Goal 1 - Progress (Week 3): Met (with slide board, mod A with RW) PT Short Term Goal 2 (Week 3): Pt will transfer sit<>stand with LRAD and mod A from WC PT Short Term Goal 2 - Progress (Week 3): Met PT Short Term Goal 3 (Week 3): pt will perform all bed mobility with CGA PT Short Term Goal 3 - Progress (Week 3): Met Week 4:  PT Short Term Goal 1 (Week 4): pt to demonstrate min A STS from various surfaces with RW PT Short Term Goal 2 (Week 4): pt to demonstrate gait  training with RW mod A for 15'  Skilled Therapeutic Interventions/Progress Updates:    pt received in bed and agreeable to therapy. Pt directed in supine>sit SBA, donned abdominal binder mod A. Pt then directed in Sit to stand from bedside with height of bed elevated with pt able to achieve standing on x3 attempt. Pt then min A for Stand pivot transfer to [facility] with Rolling walker. Pt directed in [place] mobility to gym 150' CGA with straight paths, and light min A for turning. Pt then directed in Sit to stand to Rolling walker unable to achieve standing. Pt then directed in x5 Sit to stand in // mod A improving to min A with VC for technique. Pt reported increased fatigue after this and requested to return to room. Pt requested to return to bed at end of session, unable to complete with Rolling walker and required slide board transfer to bedside at [facility] with pt able to place board. Pt then directed in sit>supine SBA. Pt left in room, nursing present All needs in reach and in good condition. Call light in hand.    Therapy Documentation Precautions:  Precautions Precautions: Fall Precaution Comments: wounds on bilat feet and L sacrum, inconsistent orthostatic BPs Required Braces or Orthoses: Other Brace Other  Brace: abdominal binder PRN with standing/strenuous activity Restrictions Weight Bearing Restrictions: No General:   Vital Signs: Therapy Vitals Temp: 99.5 F (37.5 C) Temp Source: Oral Pulse Rate: 92 Resp: 16 BP: (!) 156/91 Patient Position (if appropriate): Lying Oxygen Therapy SpO2: 100 % O2 Device: Room Air Pain: Pain Assessment Pain Score: 0-No pain Mobility:   Locomotion :    Trunk/Postural Assessment :    Balance:   Exercises:   Other Treatments:      Therapy/Group: Individual Therapy  [name] 07/02/2020, 3:30 PM

## 2020-07-02 NOTE — Progress Notes (Signed)
[facility] Progress Note   Subjective:   Reports he still "somewhat" has diarrhea. Still getting dizzy when working with therapy. Denies SOB, CP, palpitations, abdominal pain and nausea.   Objective Vitals:   07/01/20 1457 07/01/20 1542 07/01/20 1937 07/02/20 0533  BP: (!) 150/89 132/88 129/84 (!) 151/83  Pulse: 86 85 90 88  Resp: '17 18 18 16  '$ Temp: 98.5 F (36.9 C) 98.4 F (36.9 C) 98.9 F (37.2 C) 99.6 F (37.6 C)  TempSrc: Oral  Oral   SpO2: 99% 99% 97% 99%  Weight:      Height:       Physical Exam General: Well developed, ill appearing male in NAD Heart: RRR, no murmurs, rubs or gallops Lungs: CTA bilaterally, respirations unlabored on RA Abdomen: Soft, non-tender, non-distended, +BS Extremities: B/l legs wrapped, no edema noted Dialysis Access:  R [facility]  Additional Objective Labs: Basic Metabolic Panel: Recent Labs  Lab 06/28/20 0537 06/29/20 1645 07/01/20 1229  NA 134* 133* 134*  K 4.2 4.8 4.5  CL 99 96* 98  CO2 '25 25 25  '$ GLUCOSE 103* 119* 110*  BUN 45* 63* 47*  CREATININE 6.10* 7.51* 6.79*  CALCIUM 9.7 9.9 9.0  PHOS 4.2 4.7* 4.2   Liver Function Tests: Recent Labs  Lab 06/28/20 0537 06/29/20 1645 07/01/20 1229  ALBUMIN 2.2* 2.3* 2.1*   No results for input(s): LIPASE, AMYLASE in the last 168 hours. CBC: Recent Labs  Lab 06/26/20 1709 06/28/20 0537 06/29/20 1645 06/30/20 0513 07/01/20 1229  WBC 8.1 9.3 10.9* 7.5 10.5  NEUTROABS  --  5.7  --  4.8  --   HGB 10.0* 10.9* 10.5* 9.8* 9.8*  HCT 33.7* 34.0* 34.5* 30.9* 32.4*  MCV 86.0 84.0 83.9 83.5 83.9  PLT 286 330 323 272 311   Blood Culture    Component Value Date/Time   SDES SACRAL 06/30/2020 0013   SPECREQUEST NONE 06/30/2020 0013   CULT  06/30/2020 0013    CULTURE REINCUBATED FOR BETTER GROWTH Performed at [facility] [address] [place],  [address]    REPTSTATUS PENDING 06/30/2020 0013    Cardiac Enzymes: No results for input(s): CKTOTAL,  CKMB, CKMBINDEX, TROPONINI in the last 168 hours. CBG: Recent Labs  Lab 07/01/20 0632 07/01/20 1152 07/01/20 1647 07/01/20 2059 07/02/20 0543  GLUCAP 106* 121* 126* 142* 97   Iron Studies:  Recent Labs    07/01/20 1229  IRON 10*  TIBC 125*   '@lablastinr3'$ @ Studies/Results: MR PELVIS WO CONTRAST  Result Date: 07/01/2020 CLINICAL DATA:  Sacral decubitus ulcer. Evaluate for possible osteomyelitis. EXAM: MRI PELVIS WITHOUT CONTRAST TECHNIQUE: Multiplanar multisequence MR imaging of the pelvis was performed. No intravenous contrast was administered. COMPARISON:  MRI 04/29/2020 FINDINGS: Urinary Tract: As demonstrated on the prior MRI also there is layering dependent material in the bladder which has slightly decreased T2 signal intensity and slight increased T1 signal intensity. It is likely a small amount of hemorrhage or possibly some type of proteinaceous debris. No bladder mass is identified. Bowel: The rectum, sigmoid colon and visualized small-bowel loops are grossly normal. Vascular/Lymphatic: No significant vascular abnormalities. Small scattered pelvic and inguinal lymph nodes. Reproductive: The prostate gland and seminal vesicles are unremarkable. Other:  No free pelvic fluid collections or intrapelvic abscess. Musculoskeletal: There is a sizable left-sided sacral decubitus ulcer. This has increased in size since the prior study. Thick rim of granulation tissue is noted along with some fluid in the wound. Mild surrounding changes of  cellulitis and myositis involving the left gluteus maximus muscle and also to a lesser extent the left piriformis muscle. No findings to suggest pyomyositis. I do not see any definite MR findings to suggest osteomyelitis or septic arthritis. IMPRESSION: 1. Enlarging left-sided sacral decubitus ulcer. Surrounding cellulitis and myositis involving the left gluteus maximus muscle and left piriformis muscle. 2. No definite MR findings to suggest osteomyelitis or  septic arthritis. 3. Layering dependent material in the bladder likely a small amount of hemorrhage or possibly some type of proteinaceous debris. Electronically Signed   By: Marijo Sanes M.D.   On: 07/01/2020 13:17   Medications: . [START ON 07/03/2020] iron sucrose     . (feeding supplement) PROSource Plus  30 mL Oral TID BM  . amLODipine  7.5 mg Oral QHS  . Chlorhexidine Gluconate Cloth  6 each Topical BID  . darbepoetin (ARANESP) injection - DIALYSIS  200 mcg Intravenous Q Tue-HD  . famotidine  20 mg Oral QHS  . feeding supplement (NEPRO CARB STEADY)  237 mL Oral TID BM  . heparin injection (subcutaneous)  7,500 Units Subcutaneous Q8H  . insulin aspart  0-9 Units Subcutaneous TID WC  . insulin glargine  11 Units Subcutaneous QHS  . lanthanum  500 mg Oral TID WC  . multivitamin  1 tablet Oral QHS  . pneumococcal 23 valent vaccine  0.5 mL Intramuscular Tomorrow-1000  . polyethylene glycol  17 g Oral Daily    Dialysis Orders: NWTTS 4h 400/800 143.5kg 3K/2.5Ca TDCHeparin3000units IV TIW - Mircera141mgIVq2wks - last 12/8 - Venofer '100mg'$ IVqHD x10 - completed 6/10 doses  Assessment/Plan: 1. Debilitation with history of prolonged hospitalization COVID 04/20/2020 sacral decub/coccygealosteo:S/p 6 weeks ofVanc/Cefepime (finished1/20/22) - In CIR. 2. ESRDHD: Continue TTS schedule. Inpatient dialysis treatments are truncated due to high patient census at this time. 3. 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 Low-grade temperature: Blood cultures negative. ID w/low suspicion for pneumonia. Recommended stopping antibiotics due to diarrhea. No indication to remove TDC at this time since cultures are negative. Will eventually need permanent dialysis access once more stable.  5. Diarrhea -Recurrent issue,initially thoughtd/t Renvelabut no improvement after binders were switched. ID recommends stopping antibiotics and monitoring.   6. BP/volume:Overall BP control is improved.  Continues to have issues with orthostatic BP with therapy despite minimal/no UF with HD. PT has documented orthostatic blood pressures today. Has not been requiring PRN hydralazine. Will decrease amlodipine to '5mg'$ .  7. Anemiaof CKD: Hgb9.8cont Aranesp 2014mq Sat. Can start IV Fe bolus once off IV abx. 8. Secondary Hyperparathyroidism:CorrCaelevated,use low Ca bath.Not on VDRA.onFosrenolwithPO44.2 9. Nutrition:Alb2.1 remains low, continue Nepro. 10. DMT2 - per admit   SaAnice PaganiniPA-C 07/02/2020, 11:22 AM  [facility] Pager: [phone]

## 2020-07-02 NOTE — Progress Notes (Signed)
Physical Therapy Session Note  [name] MRN: [id number] Date of Birth: Feb 23, 1971  Today's Date: 07/02/2020 PT Individual Time: 0814-4818 PT Individual Time Calculation (min): 40 min   Short Term Goals: Week 4:  PT Short Term Goal 1 (Week 4): pt to demonstrate min A STS from various surfaces with RW PT Short Term Goal 2 (Week 4): pt to demonstrate gait training with RW mod A for 15'  Skilled Therapeutic Interventions/Progress Updates:   Pt received sitting in [facility] and agreeable to PT. Pt reports need to urinate. Min assist to doff pants from sitting with cues for improved lateral weight shift. Unable to void bladder. Mod assist to don pants with lateral weight shift from sitting.   WC mobility x 29f with supervision assist and min cues for increased posterior reach to increase amplitude for drive stroke.   Sit<>stand in parallel bars with min assist and forward/reverse gait with min assist x 4 ft each. Pt reports increasing dizziness and required to sit. BP assessed in sitting 109/62, HR 92. durring and immediately following standing 81/56 HR 113. 2 min sitting in WC 92/66, HR 102.   Pt transported to day room. Reciprocal movement activity tolerance training with kinetron 4 x 30-45 sec with 1 min therapeutic rest break between bouts. Cues for increased ROM throughout.   SB transfer to bed with set up assist. Sit>supine without assist from PT and mild use of bed rails. Pt urinated in urinal once in bed without assist. Pt left supine in bed with call bell in reach and all needs met. Supine BP 141/89. HR 89      Therapy Documentation Precautions:  Precautions Precautions: Fall Precaution Comments: wounds on bilat feet and L sacrum, inconsistent orthostatic BPs Required Braces or Orthoses: Other Brace Other Brace: abdominal binder PRN with standing/strenuous activity Restrictions Weight Bearing Restrictions: No Pain: Denies  Therapy/Group: Individual  Therapy  ALorie Phenix2/03/2021, 9:47 AM

## 2020-07-02 NOTE — Progress Notes (Signed)
[facility] PHYSICAL MEDICINE & REHABILITATION PROGRESS NOTE   Subjective/Complaints:  Is hot this AM- gown on floor-  Had a dizzy spell in therapy yesterday with standing- didn't pass out.  N/V is better.    ROS:   Pt denies SOB, abd pain, CP, N/V/C/D, and vision changes   Objective:   MR PELVIS WO CONTRAST  Result Date: 07/01/2020 CLINICAL DATA:  Sacral decubitus ulcer. Evaluate for possible osteomyelitis. EXAM: MRI PELVIS WITHOUT CONTRAST TECHNIQUE: Multiplanar multisequence MR imaging of the pelvis was performed. No intravenous contrast was administered. COMPARISON:  MRI 04/29/2020 FINDINGS: Urinary Tract: As demonstrated on the prior MRI also there is layering dependent material in the bladder which has slightly decreased T2 signal intensity and slight increased T1 signal intensity. It is likely a small amount of hemorrhage or possibly some type of proteinaceous debris. No bladder mass is identified. Bowel: The rectum, sigmoid colon and visualized small-bowel loops are grossly normal. Vascular/Lymphatic: No significant vascular abnormalities. Small scattered pelvic and inguinal lymph nodes. Reproductive: The prostate gland and seminal vesicles are unremarkable. Other:  No free pelvic fluid collections or intrapelvic abscess. Musculoskeletal: There is a sizable left-sided sacral decubitus ulcer. This has increased in size since the prior study. Thick rim of granulation tissue is noted along with some fluid in the wound. Mild surrounding changes of cellulitis and myositis involving the left gluteus maximus muscle and also to a lesser extent the left piriformis muscle. No findings to suggest pyomyositis. I do not see any definite MR findings to suggest osteomyelitis or septic arthritis. IMPRESSION: 1. Enlarging left-sided sacral decubitus ulcer. Surrounding cellulitis and myositis involving the left gluteus maximus muscle and left piriformis muscle. 2. No definite MR findings to suggest  osteomyelitis or septic arthritis. 3. Layering dependent material in the bladder likely a small amount of hemorrhage or possibly some type of proteinaceous debris. Electronically Signed   By: Marijo Sanes M.D.   On: 07/01/2020 13:17   Recent Labs    06/30/20 0513 07/01/20 1229  WBC 7.5 10.5  HGB 9.8* 9.8*  HCT 30.9* 32.4*  PLT 272 311   Recent Labs    06/29/20 1645 07/01/20 1229  NA 133* 134*  K 4.8 4.5  CL 96* 98  CO2 25 25  GLUCOSE 119* 110*  BUN 63* 47*  CREATININE 7.51* 6.79*  CALCIUM 9.9 9.0    Intake/Output Summary (Last 24 hours) at 07/02/2020 0849 Last data filed at 07/02/2020 3570 Gross per 24 hour  Intake 541.73 ml  Output 800 ml  Net -258.27 ml     Pressure Injury 05/01/20 Buttocks Left Stage 4 - Full thickness tissue loss with exposed bone, tendon or muscle. packed with santyl gauze, dressed by WOCN, (Active)  05/01/20 0230  Location: Buttocks  Location Orientation: Left  Staging: Stage 4 - Full thickness tissue loss with exposed bone, tendon or muscle.  Wound Description (Comments): packed with santyl gauze, dressed by WOCN,  Present on Admission: Yes     Pressure Injury 05/01/20 Ankle Left;Lateral Unstageable - Full thickness tissue loss in which the base of the injury is covered by slough (yellow, tan, gray, green or brown) and/or eschar (tan, brown or black) in the wound bed. smal circular wound on the (Active)  05/01/20 0230  Location: Ankle  Location Orientation: Left;Lateral  Staging: Unstageable - Full thickness tissue loss in which the base of the injury is covered by slough (yellow, tan, gray, green or brown) and/or eschar (tan, brown or black) in the wound  bed.  Wound Description (Comments): smal circular wound on the bone  Present on Admission: Yes     Pressure Injury 05/01/20 Foot Left;Lateral Unstageable - Full thickness tissue loss in which the base of the injury is covered by slough (yellow, tan, gray, green or brown) and/or eschar (tan,  brown or black) in the wound bed. 4th and 5th toes appear nec (Active)  05/01/20 0230  Location: Foot (4th and 5th toes and lateral/anterior side of the sole)  Location Orientation: Left;Lateral  Staging: Unstageable - Full thickness tissue loss in which the base of the injury is covered by slough (yellow, tan, gray, green or brown) and/or eschar (tan, brown or black) in the wound bed.  Wound Description (Comments): 4th and 5th toes appear necrotic circular wound on sole  Present on Admission: Yes     Pressure Injury 05/01/20 Foot Right;Medial;Posterior Unstageable - Full thickness tissue loss in which the base of the injury is covered by slough (yellow, tan, gray, green or brown) and/or eschar (tan, brown or black) in the wound bed. large circular wo (Active)  05/01/20 0230  Location: Foot  Location Orientation: Right;Medial;Posterior  Staging: Unstageable - Full thickness tissue loss in which the base of the injury is covered by slough (yellow, tan, gray, green or brown) and/or eschar (tan, brown or black) in the wound bed.  Wound Description (Comments): large circular wound on sole  and heel  Present on Admission: Yes     Pressure Injury 05/08/20 Heel Right Unstageable - Full thickness tissue loss in which the base of the injury is covered by slough (yellow, tan, gray, green or brown) and/or eschar (tan, brown or black) in the wound bed. (Active)  05/08/20 1718  Location: Heel  Location Orientation: Right  Staging: Unstageable - Full thickness tissue loss in which the base of the injury is covered by slough (yellow, tan, gray, green or brown) and/or eschar (tan, brown or black) in the wound bed.  Wound Description (Comments):   Present on Admission: Yes    Physical Exam: Vital Signs Blood pressure (!) 151/83, pulse 88, temperature 99.6 F (37.6 C), resp. rate 16, height 6' 6"  (1.981 m), weight 119 kg, SpO2 99 %. Constitutional: awake, alert, appropriate laying in bed naked, sleepy,  wants light out, NAD Normocephalic.  Atraumatic. Eyes: EOMI. No discharge. Cardiovascular: RRR- no JVD- no tachycardia today Respiratory:CTA B/L- no W/R/R- good air movement GI: less distended; soft, NT, hypoactive BS Skin: pressure ulcers not examined  Psych: sleepy; naked this AM   Extremities: edema in extremities.  No tenderness in extremities. Neuro: Alert Motor: Bilateral upper extremities: 5/5 proximal distal Bilateral lower extremities: 4 -/5 proximal distal (?  Participation) Skin: L foot- has ruptured blood blister 5th MTP base; also 4th toe is dry gangrene- all black eschar.   Assessment/Plan: 1. Functional deficits which require 3+ hours per day of interdisciplinary therapy in a comprehensive inpatient rehab setting.  Physiatrist is providing close team supervision and 24 hour management of active medical problems listed below.  Physiatrist and rehab team continue to assess barriers to discharge/monitor patient progress toward functional and medical goals  Care Tool:  Bathing    Body parts bathed by patient: Right arm,Left arm,Chest,Abdomen,Right upper leg,Left upper leg,Face,Right lower leg,Left lower leg   Body parts bathed by helper: Front perineal area,Buttocks     Bathing assist Assist Level: Minimal Assistance - Patient > 75%     Upper Body Dressing/Undressing Upper body dressing   What is the patient  wearing?: Pull over shirt    Upper body assist Assist Level: Set up assist    Lower Body Dressing/Undressing Lower body dressing      What is the patient wearing?: Pants     Lower body assist Assist for lower body dressing: Set up assist     Toileting Toileting    Toileting assist Assist for toileting: Maximal Assistance - Patient 25 - 49% Assistive Device Comment:  (urinal)   Transfers Chair/bed transfer  Transfers assist  Chair/bed transfer activity did not occur: Safety/medical concerns (Per nursing report)  Chair/bed transfer assist  level: Minimal Assistance - Patient > 75% Chair/bed transfer assistive device: Sliding board   Locomotion Ambulation   Ambulation assist   Ambulation activity did not occur: Safety/medical concerns (fatigue, lightheadedness, generalized weakness)  Assist level: Minimal Assistance - Patient > 75% Assistive device: Parallel bars Max distance: 24 ft   Walk 10 feet activity   Assist  Walk 10 feet activity did not occur: Safety/medical concerns (fatigue, lightheadedness, generalized weakness)  Assist level: Minimal Assistance - Patient > 75% Assistive device: Parallel bars   Walk 50 feet activity   Assist Walk 50 feet with 2 turns activity did not occur: Safety/medical concerns (fatigue, lightheadedness, generalized weakness)         Walk 150 feet activity   Assist Walk 150 feet activity did not occur: Safety/medical concerns (fatigue, lightheadedness, generalized weakness)         Walk 10 feet on uneven surface  activity   Assist Walk 10 feet on uneven surfaces activity did not occur: Safety/medical concerns (fatigue, lightheadedness, generalized weakness)         Wheelchair     Assist Will patient use wheelchair at discharge?: Yes Type of Wheelchair: Manual    Wheelchair assist level: Supervision/Verbal cueing Max wheelchair distance: 100    Wheelchair 50 feet with 2 turns activity    Assist        Assist Level: Supervision/Verbal cueing   Wheelchair 150 feet activity     Assist      Assist Level: Minimal Assistance - Patient > 75%   Blood pressure (!) 151/83, pulse 88, temperature 99.6 F (37.6 C), resp. rate 16, height 6' 6"  (1.981 m), weight 119 kg, SpO2 99 %.  Medical Problem List and Plan: 1.  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  Antithrombotics: -DVT/anticoagulation: Subcutaneous heparin.               -antiplatelet therapy: N/A 3. Pain Management: Tylenol as  needed  2/11- denied pain- con't regimen 4. Mood: Provide emotional support             -antipsychotic agents: N/A 5. Neuropsych: This patient is? capable of making decisions on his own behalf. 6. Skin/Wound Care: Routine skin checks 7. Fluids/Electrolytes/Nutrition: Routine in and outs with follow-up chemistries 8.  ESRD.  Hemodialysis per renal services 9.  Sacral decubitus ulcer as well as ischemic ulcers on the left foot fourth toe and fifth metatarsal head and right heel and fifth metatarsal head.  Wound care nurse follow-up.  Patient to see Dr. Sharol Given outpatient.  Continue bilateral PRAFO's. 10.  ID/ coccygeal osteomyelitis.    Completed vancomycin and cefepime on 06/10/2020  1/18- will schedule turning q2 hours to get off wounds- also will make sure on specialty bed  1/20- is on specialty bed  Repeat labs ordered for Monday  2/7- CRP 6.5- ESR pending - con't regimen  2/8- ESR  DOWN  TO 67 FROM 73- SO HEADING IN RIGHT DIRECTION  2/9- Spiked temp last night of 100.9- restarted Vanc/Cefipime- pt refused Vanc "since causes diarrhea". WBC now normal again this AM- will con't for now. CXR (-); is anuric-  2/10- having fevers/sweats/chills- Tm 99.4- needs to also take Vanc- told this to pt- also, called ID and also ordered another CT of pelvis due to Sx's-    2/11- CT of pelvis looks worse with more myositis, etc- waiting to hear from ID about plan.  10.  Anemia of chronic disease.  Continue Aranesp.   Hemoglobin 10.0 on 2/3, labs with HD 11.  Diabetes mellitus with peripheral neuropathy .  Hemoglobin A1c 5.6.  Lantus insulin 11 units nightly  CBG (last 3)  Recent Labs    07/01/20 1647 07/01/20 2059 07/02/20 0543  GLUCAP 126* 142* 97   2/11- BGs 97-142- con't regimen 12.  Morbid obesity.  BMI 38.37.  Dietary follow-up 13. Dizziness/nausea with HTN and hypotension  Vitals:   07/01/20 1937 07/02/20 0533  BP: 129/84 (!) 151/83  Pulse: 90 88  Resp: 18 16  Temp: 98.9 F (37.2 C) 99.6 F  (37.6 C)  SpO2: 97% 99%    Hydralazine prn   Norvasc to 7.5 mg daily   2/9- BP 156/85 this AM- but has been controlled last few days  2/11- BP better today- had hypotension episode yesterday- con't Norvasc since think it occurs when BP normal, not low 14. Diarrhea with new constipation  Bowel meds increased on 2/5  2/7- will decrease bowel meds again since 8+ BMs in last 24 hours- con't regimen 15. Leukocytosis: Resolved  ESR/CRP and CBC with diff ordered for Monday.  2/7- CRP 6.5- stable- con't regimen   2/8- EST 67- down to 73- con't regimen  2/11- Labs looking slightly better, but imaging does not- waiting to hear from ID   16. N/Vomiting-  2/10- likely due to underlying illness- will do ID w/u as above.  16. Dispo  2/8- pt unhappy that has to back to SNF- however has been participating intermittently- finally doing more/more interactive, but we can only keep max of 30 days. Sent FL2  2/9- pt refused PT yesterday -only bedlevel Exercises- pt needs max encouragement to progress with therapy   2/10- might have to hold d/c- we will see.  2/11- might not be able to d/c Monday per SNF search/SW   LOS: 26 days A FACE TO FACE EVALUATION WAS PERFORMED  Sheketa Ende 07/02/2020, 8:49 AM

## 2020-07-02 NOTE — Progress Notes (Addendum)
[facility] Infect[facility]  Date of Admission:  06/06/2020      Lines:  Right chest hd catheter for 3-4 months now  Abx: 2/08-c vanc 2/08-c cefepime                                                           6 weeks vanc/cefepime ended 06/07/2019 for sacral decub OM  Assessment: Sepsis Sacral decub ulcer with om s/p 6 weeks abx tx Toes ulcers/om s/p concomittant 6 weeks abx with sacral decub ulcer om esrd on iHD (right ij) Hx cdiff   2/08 bcx ip 2/09 swab cx decub ulcer ip  procalcitonin 1.5 @ 2/08 --> 2.7 @ 2/10   Source most concerning is the hemodialysis catheter. I am not too impressed with the pelvis sacral decub ulcer. He does have ischemic ulcer in the bilateral feet that I am unable to see but per chart review no concerning sign of infection. cxr rather unremarkable and no s/s of pna as well (improving mild dry cough since covid). No other localizing infectious sx  Some diarrhea possibly due to abx otherwise low suspicion for cdiff. He does have hx cdiff on 11/4 at [facility] given 10 days vanc  ----------------- 07/02/20 assessment Afebrile since isolated fever a few days ago bcx negative Mri pelvis reviewed no abscess/om. The "cellulitis change" are rather edema due to pressure related; clinically no cellulitis  Would stop abx and monitor as he has lots of diarrhea from abx. Reports 3 episodes today of diarrhea   Plan: 1. Stop abx 2. Monitor diarrhea off abx.  3. Id will sign off, please call if further concern  Principal Problem:   Critical illness myopathy Active Problems:   Pressure injury of sacral region, unstageable (HCC)   Anemia of chronic disease   ESRD on dialysis ([place])   Diabetic peripheral neuropathy (HCC)   Labile blood glucose   Essential hypertension   Constipation   Fever   Scheduled Meds: . (feeding supplement) PROSource Plus  30 mL Oral TID BM  . amLODipine  7.5 mg Oral QHS  .  Chlorhexidine Gluconate Cloth  6 each Topical BID  . darbepoetin (ARANESP) injection - DIALYSIS  200 mcg Intravenous Q Tue-HD  . famotidine  20 mg Oral QHS  . feeding supplement (NEPRO CARB STEADY)  237 mL Oral TID BM  . heparin injection (subcutaneous)  7,500 Units Subcutaneous Q8H  . insulin aspart  0-9 Units Subcutaneous TID WC  . insulin glargine  11 Units Subcutaneous QHS  . lanthanum  500 mg Oral TID WC  . multivitamin  1 tablet Oral QHS  . pneumococcal 23 valent vaccine  0.5 mL Intramuscular Tomorrow-1000  . polyethylene glycol  17 g Oral Daily   Continuous Infusions: . [START ON 07/03/2020] iron sucrose     PRN Meds:.acetaminophen, bisacodyl, hydrALAZINE, loperamide, ondansetron **OR** ondansetron (ZOFRAN) IV, prochlorperazine   SUBJECTIVE: Feels well. No n/v. Appetite intact No cough/chest pain No back pain Diarrhea x3 today  Review of Systems: ROS Other ros negative  Allergies  Allergen Reactions  . Atorvastatin Hives    NOT on MAR    OBJECTIVE: Vitals:   07/01/20 1457 07/01/20 1542 07/01/20 1937 07/02/20 0533  BP: (!) 150/89 132/88 129/84 (!) 151/83  Pulse: 86 85 90 88  Resp: '17 18 18 16  '$ Temp: 98.5 F (36.9 C) 98.4 F (36.9 C) 98.9 F (37.2 C) 99.6 F (37.6 C)  TempSrc: Oral  Oral   SpO2: 99% 99% 97% 99%  Weight:      Height:       Body mass index is 30.32 kg/m.  Physical Exam No distress, watching tv, conversant Heent: normocephalic; conj clear cv rrr no mrg Lungs clear normal respiratory effort abd s/nt Ext trace bilateral edema Neuro nonfocal; weak bilateral LE 4-5/5 Psych alert/oriented  Catheter site no purulence/redness/tederness  Lab Results Lab Results  Component Value Date   WBC 10.5 07/01/2020   HGB 9.8 (L) 07/01/2020   HCT 32.4 (L) 07/01/2020   MCV 83.9 07/01/2020   PLT 311 07/01/2020    Lab Results  Component Value Date   CREATININE 6.79 (H) 07/01/2020   BUN 47 (H) 07/01/2020   NA 134 (L) 07/01/2020   K 4.5  07/01/2020   CL 98 07/01/2020   CO2 25 07/01/2020    Lab Results  Component Value Date   ALT 18 05/08/2020   AST 18 05/08/2020   ALKPHOS 274 (H) 05/08/2020   BILITOT 0.6 05/08/2020     Microbiology: Recent Results (from the past 240 hour(s))  Culture, blood (routine x 2)     Status: None (Preliminary result)   Collection Time: 06/29/20 10:59 PM   Specimen: BLOOD  Result Value Ref Range Status   Specimen Description BLOOD SITE NOT SPECIFIED  Final   Special Requests   Final    BOTTLES DRAWN AEROBIC AND ANAEROBIC Blood Culture adequate volume   Culture   Final    NO GROWTH 2 DAYS Performed at [facility], [address], [place], [place] [address]    Report Status PENDING  Incomplete  Culture, blood (routine x 2)     Status: None (Preliminary result)   Collection Time: 06/29/20 11:04 PM   Specimen: BLOOD  Result Value Ref Range Status   Specimen Description BLOOD SITE NOT SPECIFIED  Final   Special Requests   Final    BOTTLES DRAWN AEROBIC AND ANAEROBIC Blood Culture adequate volume   Culture   Final    NO GROWTH 2 DAYS Performed at [facility], [address] [place], [place] [address]    Report Status PENDING  Incomplete  Aerobic Culture (superficial specimen)     Status: None (Preliminary result)   Collection Time: 06/30/20 12:13 AM   Specimen: Sacral  Result Value Ref Range Status   Specimen Description SACRAL  Final   Special Requests NONE  Final   Gram Stain NO WBC SEEN NO ORGANISMS SEEN   Final   Culture   Final    CULTURE REINCUBATED FOR BETTER GROWTH Performed at [facility], [address], [place], [place] [address]    Report Status PENDING  Incomplete   Imaging: 2/10 mri pelvis Reviewed No abscess. Hyperintense signal pelvis floor muscle/SQ tissue  Jabier Mutton, [facility] [place] [phone] pager    07/02/2020, 9:20 AM

## 2020-07-02 NOTE — Progress Notes (Addendum)
Patient ID: Fred [name], male   DOB: 13-Aug-1970, 50 y.o.   MRN: [id number]  Reached admission coordinator at [facility]. Have re-sent information and await response. Spoke with sister-[name] to inform and she reported she knows the social worker there and has left her a message. Sister and pt aware will need to have a SNF bed before can discharge, so if this does not happen by Monday he will continue to stay and receive rehab.  12:20 PM [facility] has received the information sent via fax. Will evaluate and let worker know if can offer a bed.

## 2020-07-03 DIAGNOSIS — Z992 Dependence on renal dialysis: Secondary | ICD-10-CM

## 2020-07-03 LAB — CBC
HCT: 33 % — ABNORMAL LOW (ref 39.0–52.0)
Hemoglobin: 9.9 g/dL — ABNORMAL LOW (ref 13.0–17.0)
MCH: 25.4 pg — ABNORMAL LOW (ref 26.0–34.0)
MCHC: 30 g/dL (ref 30.0–36.0)
MCV: 84.6 fL (ref 80.0–100.0)
Platelets: 330 10*3/uL (ref 150–400)
RBC: 3.9 MIL/uL — ABNORMAL LOW (ref 4.22–5.81)
RDW: 15.9 % — ABNORMAL HIGH (ref 11.5–15.5)
WBC: 10.4 10*3/uL (ref 4.0–10.5)
nRBC: 0 % (ref 0.0–0.2)

## 2020-07-03 LAB — RENAL FUNCTION PANEL
Albumin: 2 g/dL — ABNORMAL LOW (ref 3.5–5.0)
Anion gap: 12 (ref 5–15)
BUN: 51 mg/dL — ABNORMAL HIGH (ref 6–20)
CO2: 25 mmol/L (ref 22–32)
Calcium: 9 mg/dL (ref 8.9–10.3)
Chloride: 98 mmol/L (ref 98–111)
Creatinine, Ser: 6.14 mg/dL — ABNORMAL HIGH (ref 0.61–1.24)
GFR, Estimated: 10 mL/min — ABNORMAL LOW (ref >60)
Glucose, Bld: 139 mg/dL — ABNORMAL HIGH (ref 70–99)
Phosphorus: 4 mg/dL (ref 2.5–4.6)
Potassium: 4.9 mmol/L (ref 3.5–5.1)
Sodium: 135 mmol/L (ref 135–145)

## 2020-07-03 LAB — GLUCOSE, CAPILLARY
Glucose-Capillary: 96 mg/dL (ref 70–99)
Glucose-Capillary: 104 mg/dL — ABNORMAL HIGH (ref 70–99)
Glucose-Capillary: 94 mg/dL (ref 70–99)
Glucose-Capillary: 118 mg/dL — ABNORMAL HIGH (ref 70–99)

## 2020-07-03 MED ORDER — HEPARIN SODIUM (PORCINE) 1000 UNIT/ML IJ SOLN
INTRAMUSCULAR | Status: AC
  Administered 2020-07-03: 1000 [IU]
  Filled 2020-07-03: qty 4

## 2020-07-03 NOTE — Progress Notes (Signed)
[place] PHYSICAL MEDICINE & REHABILITATION PROGRESS NOTE   Subjective/Complaints: Occasional dizziness still when up. Feeling ok this morning. Due for HD today. Stools mushy but not diarrhea  ROS: Patient denies fever, rash, sore throat, blurred vision, nausea, vomiting, diarrhea, cough, shortness of breath or chest pain, joint or back pain, headache, or mood change.    Objective:   MR PELVIS WO CONTRAST  Result Date: 07/01/2020 CLINICAL DATA:  Sacral decubitus ulcer. Evaluate for possible osteomyelitis. EXAM: MRI PELVIS WITHOUT CONTRAST TECHNIQUE: Multiplanar multisequence MR imaging of the pelvis was performed. No intravenous contrast was administered. COMPARISON:  MRI 04/29/2020 FINDINGS: Urinary Tract: As demonstrated on the prior MRI also there is layering dependent material in the bladder which has slightly decreased T2 signal intensity and slight increased T1 signal intensity. It is likely a small amount of hemorrhage or possibly some type of proteinaceous debris. No bladder mass is identified. Bowel: The rectum, sigmoid colon and visualized small-bowel loops are grossly normal. Vascular/Lymphatic: No significant vascular abnormalities. Small scattered pelvic and inguinal lymph nodes. Reproductive: The prostate gland and seminal vesicles are unremarkable. Other:  No free pelvic fluid collections or intrapelvic abscess. Musculoskeletal: There is a sizable left-sided sacral decubitus ulcer. This has increased in size since the prior study. Thick rim of granulation tissue is noted along with some fluid in the wound. Mild surrounding changes of cellulitis and myositis involving the left gluteus maximus muscle and also to a lesser extent the left piriformis muscle. No findings to suggest pyomyositis. I do not see any definite MR findings to suggest osteomyelitis or septic arthritis. IMPRESSION: 1. Enlarging left-sided sacral decubitus ulcer. Surrounding cellulitis and myositis involving the left  gluteus maximus muscle and left piriformis muscle. 2. No definite MR findings to suggest osteomyelitis or septic arthritis. 3. Layering dependent material in the bladder likely a small amount of hemorrhage or possibly some type of proteinaceous debris. Electronically Signed   By: Rudie Meyer M.D.   On: 07/01/2020 13:17   Recent Labs    07/01/20 1229  WBC 10.5  HGB 9.8*  HCT 32.4*  PLT 311   Recent Labs    07/01/20 1229  NA 134*  K 4.5  CL 98  CO2 25  GLUCOSE 110*  BUN 47*  CREATININE 6.79*  CALCIUM 9.0    Intake/Output Summary (Last 24 hours) at 07/03/2020 0940 Last data filed at 07/03/2020 0700 Gross per 24 hour  Intake 360 ml  Output 925 ml  Net -565 ml     Pressure Injury 05/01/20 Buttocks Left Stage 4 - Full thickness tissue loss with exposed bone, tendon or muscle. packed with santyl gauze, dressed by WOCN, (Active)  05/01/20 0230  Location: Buttocks  Location Orientation: Left  Staging: Stage 4 - Full thickness tissue loss with exposed bone, tendon or muscle.  Wound Description (Comments): packed with santyl gauze, dressed by WOCN,  Present on Admission: Yes     Pressure Injury 05/01/20 Ankle Left;Lateral Unstageable - Full thickness tissue loss in which the base of the injury is covered by slough (yellow, tan, gray, green or brown) and/or eschar (tan, brown or black) in the wound bed. smal circular wound on the (Active)  05/01/20 0230  Location: Ankle  Location Orientation: Left;Lateral  Staging: Unstageable - Full thickness tissue loss in which the base of the injury is covered by slough (yellow, tan, gray, green or brown) and/or eschar (tan, brown or black) in the wound bed.  Wound Description (Comments): smal circular wound on the  bone  Present on Admission: Yes     Pressure Injury 05/01/20 Foot Left;Lateral Unstageable - Full thickness tissue loss in which the base of the injury is covered by slough (yellow, tan, gray, green or brown) and/or eschar (tan,  brown or black) in the wound bed. 4th and 5th toes appear nec (Active)  05/01/20 0230  Location: Foot (4th and 5th toes and lateral/anterior side of the sole)  Location Orientation: Left;Lateral  Staging: Unstageable - Full thickness tissue loss in which the base of the injury is covered by slough (yellow, tan, gray, green or brown) and/or eschar (tan, brown or black) in the wound bed.  Wound Description (Comments): 4th and 5th toes appear necrotic circular wound on sole  Present on Admission: Yes     Pressure Injury 05/01/20 Foot Right;Medial;Posterior Unstageable - Full thickness tissue loss in which the base of the injury is covered by slough (yellow, tan, gray, green or brown) and/or eschar (tan, brown or black) in the wound bed. large circular wo (Active)  05/01/20 0230  Location: Foot  Location Orientation: Right;Medial;Posterior  Staging: Unstageable - Full thickness tissue loss in which the base of the injury is covered by slough (yellow, tan, gray, green or brown) and/or eschar (tan, brown or black) in the wound bed.  Wound Description (Comments): large circular wound on sole  and heel  Present on Admission: Yes     Pressure Injury 05/08/20 Heel Right Unstageable - Full thickness tissue loss in which the base of the injury is covered by slough (yellow, tan, gray, green or brown) and/or eschar (tan, brown or black) in the wound bed. (Active)  05/08/20 1718  Location: Heel  Location Orientation: Right  Staging: Unstageable - Full thickness tissue loss in which the base of the injury is covered by slough (yellow, tan, gray, green or brown) and/or eschar (tan, brown or black) in the wound bed.  Wound Description (Comments):   Present on Admission: Yes    Physical Exam: Vital Signs Blood pressure 106/80, pulse 85, temperature 98.5 F (36.9 C), resp. rate 16, height _0  (1.981 m), weight 119 kg, SpO2 100 %. Constitutional: No distress . Vital signs reviewed. HEENT: EOMI, oral  membranes moist Neck: supple Cardiovascular: RRR without murmur. No JVD    Respiratory/Chest: CTA Bilaterally without wheezes or rales. Normal effort    GI/Abdomen: BS +, non-tender, non-distended Ext: no clubbing, cyanosis, or edema Psych: fairly cooperative Extremities: edema in extremities.  No tenderness in extremities. Neuro: Alert Motor: Bilateral upper extremities: 5/5 proximal distal Bilateral lower extremities: 4-/5 proximal distal   Skin: L foot- has ruptured blood blister 5th MTP base; also 4th toe is dry gangrene- all black eschar.--no changes today   Assessment/Plan: 1. Functional deficits which require 3+ hours per day of interdisciplinary therapy in a comprehensive inpatient rehab setting.  Physiatrist is providing close team supervision and 24 hour management of active medical problems listed below.  Physiatrist and rehab team continue to assess barriers to discharge/monitor patient progress toward functional and medical goals  Care Tool:  Bathing    Body parts bathed by patient: Right arm,Left arm,Chest,Abdomen,Right upper leg,Left upper leg,Face,Right lower leg,Left lower leg   Body parts bathed by helper: Front perineal area,Buttocks     Bathing assist Assist Level: Minimal Assistance - Patient > 75%     Upper Body Dressing/Undressing Upper body dressing   What is the patient wearing?: Pull over shirt    Upper body assist Assist Level: Set up assist  Lower Body Dressing/Undressing Lower body dressing      What is the patient wearing?: Pants     Lower body assist Assist for lower body dressing: Minimal Assistance - Patient > 75%     Toileting Toileting    Toileting assist Assist for toileting: Total Assistance - Patient < 25% Assistive Device Comment:  (urinal)   Transfers Chair/bed transfer  Transfers assist  Chair/bed transfer activity did not occur: Safety/medical concerns (Per nursing report)  Chair/bed transfer assist level: Minimal  Assistance - Patient > 75% Chair/bed transfer assistive device: Sliding board   Locomotion Ambulation   Ambulation assist   Ambulation activity did not occur: Safety/medical concerns (fatigue, lightheadedness, generalized weakness)  Assist level: Minimal Assistance - Patient > 75% Assistive device: Parallel bars Max distance: 24 ft   Walk 10 feet activity   Assist  Walk 10 feet activity did not occur: Safety/medical concerns (fatigue, lightheadedness, generalized weakness)  Assist level: Minimal Assistance - Patient > 75% Assistive device: Parallel bars   Walk 50 feet activity   Assist Walk 50 feet with 2 turns activity did not occur: Safety/medical concerns (fatigue, lightheadedness, generalized weakness)         Walk 150 feet activity   Assist Walk 150 feet activity did not occur: Safety/medical concerns (fatigue, lightheadedness, generalized weakness)         Walk 10 feet on uneven surface  activity   Assist Walk 10 feet on uneven surfaces activity did not occur: Safety/medical concerns (fatigue, lightheadedness, generalized weakness)         Wheelchair     Assist Will patient use wheelchair at discharge?: Yes Type of Wheelchair: Manual    Wheelchair assist level: Supervision/Verbal cueing Max wheelchair distance: 100    Wheelchair 50 feet with 2 turns activity    Assist        Assist Level: Supervision/Verbal cueing   Wheelchair 150 feet activity     Assist      Assist Level: Minimal Assistance - Patient > 75%   Blood pressure 106/80, pulse 85, temperature 98.5 F (36.9 C), resp. rate 16, height $RemoveBe'6\' 6"'DPIbMYwMi$  (1.981 m), weight 119 kg, SpO2 100 %.  Medical Problem List and Plan: 1.  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  Antithrombotics: -DVT/anticoagulation: Subcutaneous heparin.               -antiplatelet therapy: N/A 3. Pain Management: Tylenol as needed  2/11- denied  pain- con't regimen 4. Mood: Provide emotional support             -antipsychotic agents: N/A 5. Neuropsych: This patient is? capable of making decisions on his own behalf. 6. Skin/Wound Care: Routine skin checks 7. Fluids/Electrolytes/Nutrition: Routine in and outs with follow-up chemistries 8.  ESRD.  Hemodialysis per renal services 9.  Sacral decubitus ulcer as well as ischemic ulcers on the left foot fourth toe and fifth metatarsal head and right heel and fifth metatarsal head.  Wound care nurse follow-up.  Patient to see Dr. Sharol Given outpatient.  Continue bilateral PRAFO's. 10.  ID/ coccygeal osteomyelitis.    Completed vancomycin and cefepime on 06/10/2020  1/18- will schedule turning q2 hours to get off wounds- also will make sure on specialty bed  1/20- is on specialty bed  Repeat labs ordered for Monday  2/7- CRP 6.5- ESR pending - con't regimen  2/8- ESR  DOWN TO 67 FROM 73- SO HEADING IN RIGHT DIRECTION  2/9- Spiked temp last night of 100.9-  restarted Vanc/Cefipime- pt refused Vanc "since causes diarrhea". WBC now normal again this AM- will con't for now. CXR (-); is anuric-  2/10- having fevers/sweats/chills- Tm 99.4- needs to also take Vanc- told this to pt- also, called ID and also ordered another CT of pelvis due to Sx's-    2/12 ID saw pt yesterday. Thinks that low grade temps are coming from HD cath. Was not impressed by MRI of pelvis. Recommended stopping abx (d/t loose stool)   -continue to monitor   -continue [facility] per nephrology 10.  Anemia of chronic disease.  Continue Aranesp.   Hemoglobin 10.0 on 2/3, labs with HD 11.  Diabetes mellitus with peripheral neuropathy .  Hemoglobin A1c 5.6.  Lantus insulin 11 units nightly  CBG (last 3)  Recent Labs    07/02/20 1701 07/02/20 2046 07/03/20 0607  GLUCAP 149* 129* 96   2/12- cbg's under reasonable control 12.  Morbid obesity.  BMI 38.37.  Dietary follow-up 13. Dizziness/nausea with HTN and hypotension  Vitals:   07/02/20  2001 07/03/20 0529  BP: (!) 142/89 106/80  Pulse: 95 85  Resp: 16   Temp: 99 F (37.2 C) 98.5 F (36.9 C)  SpO2: 100% 100%    Hydralazine prn   Norvasc to 7.5 mg daily   2/9- BP 156/85 this AM- but has been controlled last few days  2/12-bps still with some fluctuation but not severe. Continue current meds, volume mgt per nephrology 14. Diarrhea with new constipation  Bowel meds increased on 2/5  2/7- will decrease bowel meds again since 8+ BMs in last 24 hours- con't regimen  2/12 stool still mushy. Stop miralax! 15. Leukocytosis: Resolved  ESR/CRP and CBC with diff ordered for Monday.  2/7- CRP 6.5- stable- con't regimen   2/8- EST 67- down to 73- con't regimen  2/11- Labs looking slightly better, but imaging does not- waiting to hear from ID   16. N/Vomiting-  2/10- likely due to underlying illness- will do ID w/u as above.  16. Dispo  2/8- pt unhappy that has to back to SNF- however has been participating intermittently- finally doing more/more interactive, but we can only keep max of 30 days. Sent FL2  2/9- pt refused PT yesterday -only bedlevel Exercises- pt needs max encouragement to progress with therapy   2/10- might have to hold d/c- we will see.  2/11- might not be able to d/c Monday per SNF search/SW   LOS: 27 days A FACE TO [place] 07/03/2020, 9:40 AM

## 2020-07-03 NOTE — Progress Notes (Signed)
[facility] Progress Note   Subjective:   Reports feeling well this AM, diarrhea improved. Denies SOB, CP, palpitations, dizziness, abdominal pain and nausea.  Objective Vitals:   07/02/20 0533 07/02/20 1314 07/02/20 2001 07/03/20 0529  BP: (!) 151/83 (!) 156/91 (!) 142/89 106/80  Pulse: 88 92 95 85  Resp: '16 16 16   '$ Temp: 99.6 F (37.6 C) 99.5 F (37.5 C) 99 F (37.2 C) 98.5 F (36.9 C)  TempSrc:  Oral    SpO2: 99% 100% 100% 100%  Weight:      Height:       Physical Exam  General: Well developed, ill appearing male in NAD Heart: RRR, no murmurs, rubs or gallops Lungs: CTA bilaterally, respirations unlabored on RA Abdomen: Soft, non-tender, non-distended, +BS Extremities: B/l legs wrapped, no edema noted Dialysis Access:  R IJ TDC clean bandage, no erythema/drainage  Additional Objective Labs: Basic Metabolic Panel: Recent Labs  Lab 06/28/20 0537 06/29/20 1645 07/01/20 1229  NA 134* 133* 134*  K 4.2 4.8 4.5  CL 99 96* 98  CO2 '25 25 25  '$ GLUCOSE 103* 119* 110*  BUN 45* 63* 47*  CREATININE 6.10* 7.51* 6.79*  CALCIUM 9.7 9.9 9.0  PHOS 4.2 4.7* 4.2   Liver Function Tests: Recent Labs  Lab 06/28/20 0537 06/29/20 1645 07/01/20 1229  ALBUMIN 2.2* 2.3* 2.1*   No results for input(s): LIPASE, AMYLASE in the last 168 hours. CBC: Recent Labs  Lab 06/26/20 1709 06/28/20 0537 06/29/20 1645 06/30/20 0513 07/01/20 1229  WBC 8.1 9.3 10.9* 7.5 10.5  NEUTROABS  --  5.7  --  4.8  --   HGB 10.0* 10.9* 10.5* 9.8* 9.8*  HCT 33.7* 34.0* 34.5* 30.9* 32.4*  MCV 86.0 84.0 83.9 83.5 83.9  PLT 286 330 323 272 311   Blood Culture    Component Value Date/Time   SDES SACRAL 06/30/2020 0013   SPECREQUEST NONE 06/30/2020 0013   CULT MULTIPLE ORGANISMS PRESENT, NONE PREDOMINANT (A) 06/30/2020 0013   REPTSTATUS 07/02/2020 FINAL 06/30/2020 0013    Cardiac Enzymes: No results for input(s): CKTOTAL, CKMB, CKMBINDEX, TROPONINI in the last 168  hours. CBG: Recent Labs  Lab 07/02/20 0543 07/02/20 1142 07/02/20 1701 07/02/20 2046 07/03/20 0607  GLUCAP 97 177* 149* 129* 96   Iron Studies:  Recent Labs    07/01/20 1229  IRON 10*  TIBC 125*   '@lablastinr3'$ @ Studies/Results: MR PELVIS WO CONTRAST  Result Date: 07/01/2020 CLINICAL DATA:  Sacral decubitus ulcer. Evaluate for possible osteomyelitis. EXAM: MRI PELVIS WITHOUT CONTRAST TECHNIQUE: Multiplanar multisequence MR imaging of the pelvis was performed. No intravenous contrast was administered. COMPARISON:  MRI 04/29/2020 FINDINGS: Urinary Tract: As demonstrated on the prior MRI also there is layering dependent material in the bladder which has slightly decreased T2 signal intensity and slight increased T1 signal intensity. It is likely a small amount of hemorrhage or possibly some type of proteinaceous debris. No bladder mass is identified. Bowel: The rectum, sigmoid colon and visualized small-bowel loops are grossly normal. Vascular/Lymphatic: No significant vascular abnormalities. Small scattered pelvic and inguinal lymph nodes. Reproductive: The prostate gland and seminal vesicles are unremarkable. Other:  No free pelvic fluid collections or intrapelvic abscess. Musculoskeletal: There is a sizable left-sided sacral decubitus ulcer. This has increased in size since the prior study. Thick rim of granulation tissue is noted along with some fluid in the wound. Mild surrounding changes of cellulitis and myositis involving the left gluteus maximus muscle and also to a lesser extent the  left piriformis muscle. No findings to suggest pyomyositis. I do not see any definite MR findings to suggest osteomyelitis or septic arthritis. IMPRESSION: 1. Enlarging left-sided sacral decubitus ulcer. Surrounding cellulitis and myositis involving the left gluteus maximus muscle and left piriformis muscle. 2. No definite MR findings to suggest osteomyelitis or septic arthritis. 3. Layering dependent material  in the bladder likely a small amount of hemorrhage or possibly some type of proteinaceous debris. Electronically Signed   By: Marijo Sanes M.D.   On: 07/01/2020 13:17   Medications: . iron sucrose     . (feeding supplement) PROSource Plus  30 mL Oral TID BM  . amLODipine  5 mg Oral QHS  . Chlorhexidine Gluconate Cloth  6 each Topical BID  . Chlorhexidine Gluconate Cloth  6 each Topical Q0600  . darbepoetin (ARANESP) injection - DIALYSIS  200 mcg Intravenous Q Tue-HD  . famotidine  20 mg Oral QHS  . feeding supplement (NEPRO CARB STEADY)  237 mL Oral TID BM  . heparin injection (subcutaneous)  7,500 Units Subcutaneous Q8H  . insulin aspart  0-9 Units Subcutaneous TID WC  . insulin glargine  11 Units Subcutaneous QHS  . lanthanum  500 mg Oral TID WC  . multivitamin  1 tablet Oral QHS  . pneumococcal 23 valent vaccine  0.5 mL Intramuscular Tomorrow-1000    Dialysis Orders: NWTTS 4h 400/800 143.5kg 3K/2.5Ca TDCHeparin3000units IV TIW - Mircera138mgIVq2wks - last 12/8 - Venofer '100mg'$ IVqHD x10 - completed 6/10 doses   Assessment/Plan: 1. Debilitation with history of prolonged hospitalization COVID 04/20/2020 sacral decub/coccygealosteo:S/p 6 weeks ofVanc/Cefepime (finished1/20/22) - In CIR. 2. ESRDHD: Continue TTS schedule. Inpatient dialysis treatments are truncated due to high patient census at this time. 3. 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 Low-grade temperature: Blood cultures negative. ID w/low suspicion for pneumonia. Recommended stopping antibiotics due to diarrhea. No indication to remove TDC at this time since blood cultures are negative, patient remains afebrile without leukocytosis. Will eventually need permanent dialysis access once more stable.  5. Diarrhea -Recurrent issue,initially thoughtd/t Renvelabut no improvement after binders were switched. ID recommends stopping antibiotics and monitoring.   6. BP/volume:BP high on admission, improved but continues to have issues with orthostatic BP with therapy despite minimal/no UF with HD. PT has documented orthostatic blood pressures 2/11. Amlodipine decreased to '5mg'$  yesterday. Monitor BP/orthostatics over the next couple days. Has not been requiring PRN hydralazine.   7. Anemiaof CKD: Hgb9.8cont Aranesp 2044mq Sat. Can start IV Fe bolus once off IV abx. 8. Secondary Hyperparathyroidism:CorrCaelevated,use low Ca bath.Not on VDRA.onFosrenolwithPO44.2 9. Nutrition:Alb2.1remains low, continue Nepro. 10. DMT2 - per admit    SaAnice PaganiniPA-C 07/03/2020, 10:33 AM  [facility] Pager: [phone]

## 2020-07-04 LAB — GLUCOSE, CAPILLARY
Glucose-Capillary: 97 mg/dL (ref 70–99)
Glucose-Capillary: 173 mg/dL — ABNORMAL HIGH (ref 70–99)
Glucose-Capillary: 158 mg/dL — ABNORMAL HIGH (ref 70–99)
Glucose-Capillary: 163 mg/dL — ABNORMAL HIGH (ref 70–99)

## 2020-07-04 NOTE — Progress Notes (Signed)
  Physical Therapy Session Note  [name] MRN: [id number] Date of Birth: [birth year]  Today's Date: 07/04/2020 PT Individual Time: [id number] PT Individual Time Calculation (min): 54 min   Short Term Goals: Week 4:  PT Short Term Goal 1 (Week 4): pt to demonstrate min A STS from various surfaces with RW PT Short Term Goal 2 (Week 4): pt to demonstrate gait training with RW mod A for 15'  Skilled Therapeutic Interventions/Progress Updates:    Pt presents in bed and reports having a rough night. Also states he had a BM. Supervision rolling and cues for bridging for changing of brief. Total assist for hygiene. Education provided throughout on importance of continued mobility and encouragement provided. Agreeable to work on sit <> stands. Able to come to EOB with supervision using railings for support. Supervision sitting balance while donning abdominal binder for BP management. Reports mild symptoms but "ok". Attempted x 3 for sit > stand from elevated bed height but unable to achieve full standing and difficult to get leverage with soft mattress. Transferred via slideboard with min assist to w/c and attempted sit > stands again on 3 rd attempt able to get up into full standing with max assist for sit > stand and cues for hand placement and technique, for about 6 seconds before needing to sit (demonstrates decreased eccentric control) and reports increased dizziness. Difficulty getting a BP (see below) and symptoms not resolving, transferred back to bed via slideboard with min assist and cues for technique. Returned to supine with supervision and reassessed BP which had improved. Symptoms not totally resolved but pt reports better after laying. Repositioned with supervision and cues for bridging technique. All needs in reach.   79/56 mmHg; HR = 112 bpm in w/c with abdominal binder donned after standing attempt 111/81 mmHg; HR = 96 bpm once returned to supine with adbomial  binder doffed. Reports symptoms resolving some.  Therapy Documentation Precautions:  Precautions Precautions: Fall Precaution Comments: wounds on bilat feet and L sacrum, inconsistent orthostatic BPs Required Braces or Orthoses: Other Brace Other Brace: abdominal binder PRN with standing/strenuous activity Restrictions Weight Bearing Restrictions: No General:   Vital Signs: Therapy Vitals Temp: (!) 97.5 F (36.4 C) Pulse Rate: 90 Resp: 16 BP: 125/72 Patient Position (if appropriate): Lying Oxygen Therapy SpO2: 96 % O2 Device: Room Air Pain: Pain Assessment Pain Scale: 0-10 Pain Score: 0-No pain   Therapy/Group: Individual Therapy  Canary Brim Ivory Broad, PT, DPT, CBIS  07/04/2020, 9:28 AM

## 2020-07-04 NOTE — Progress Notes (Signed)
[facility] PHYSICAL MEDICINE & REHABILITATION PROGRESS NOTE   Subjective/Complaints: No new issues. Slept well. Had large liquid stool last night after a smaller mushy stool.   ROS: no CP, SOB, ongoing buttock pain. No N/V  Objective:   No results found. Recent Labs    07/01/20 1229 07/03/20 1654  WBC 10.5 10.4  HGB 9.8* 9.9*  HCT 32.4* 33.0*  PLT 311 330   Recent Labs    07/01/20 1229 07/03/20 1654  NA 134* 135  K 4.5 4.9  CL 98 98  CO2 25 25  GLUCOSE 110* 139*  BUN 47* 51*  CREATININE 6.79* 6.14*  CALCIUM 9.0 9.0    Intake/Output Summary (Last 24 hours) at 07/04/2020 0826 Last data filed at 07/04/2020 0033 Gross per 24 hour  Intake 817 ml  Output 950 ml  Net -133 ml     Pressure Injury 05/01/20 Buttocks Left Stage 4 - Full thickness tissue loss with exposed bone, tendon or muscle. packed with santyl gauze, dressed by WOCN, (Active)  05/01/20 0230  Location: Buttocks  Location Orientation: Left  Staging: Stage 4 - Full thickness tissue loss with exposed bone, tendon or muscle.  Wound Description (Comments): packed with santyl gauze, dressed by WOCN,  Present on Admission: Yes     Pressure Injury 05/01/20 Ankle Left;Lateral Unstageable - Full thickness tissue loss in which the base of the injury is covered by slough (yellow, tan, gray, green or brown) and/or eschar (tan, brown or black) in the wound bed. smal circular wound on the (Active)  05/01/20 0230  Location: Ankle  Location Orientation: Left;Lateral  Staging: Unstageable - Full thickness tissue loss in which the base of the injury is covered by slough (yellow, tan, gray, green or brown) and/or eschar (tan, brown or black) in the wound bed.  Wound Description (Comments): smal circular wound on the bone  Present on Admission: Yes     Pressure Injury 05/01/20 Foot Left;Lateral Unstageable - Full thickness tissue loss in which the base of the injury is covered by slough (yellow, tan, gray, green or brown)  and/or eschar (tan, brown or black) in the wound bed. 4th and 5th toes appear nec (Active)  05/01/20 0230  Location: Foot (4th and 5th toes and lateral/anterior side of the sole)  Location Orientation: Left;Lateral  Staging: Unstageable - Full thickness tissue loss in which the base of the injury is covered by slough (yellow, tan, gray, green or brown) and/or eschar (tan, brown or black) in the wound bed.  Wound Description (Comments): 4th and 5th toes appear necrotic circular wound on sole  Present on Admission: Yes     Pressure Injury 05/01/20 Foot Right;Medial;Posterior Unstageable - Full thickness tissue loss in which the base of the injury is covered by slough (yellow, tan, gray, green or brown) and/or eschar (tan, brown or black) in the wound bed. large circular wo (Active)  05/01/20 0230  Location: Foot  Location Orientation: Right;Medial;Posterior  Staging: Unstageable - Full thickness tissue loss in which the base of the injury is covered by slough (yellow, tan, gray, green or brown) and/or eschar (tan, brown or black) in the wound bed.  Wound Description (Comments): large circular wound on sole  and heel  Present on Admission: Yes     Pressure Injury 05/08/20 Heel Right Unstageable - Full thickness tissue loss in which the base of the injury is covered by slough (yellow, tan, gray, green or brown) and/or eschar (tan, brown or black) in the wound bed. (Active)  05/08/20 1718  Location: Heel  Location Orientation: Right  Staging: Unstageable - Full thickness tissue loss in which the base of the injury is covered by slough (yellow, tan, gray, green or brown) and/or eschar (tan, brown or black) in the wound bed.  Wound Description (Comments):   Present on Admission: Yes    Physical Exam: Vital Signs Blood pressure 125/72, pulse 90, temperature (!) 97.5 F (36.4 C), resp. rate 16, height 6' 6"  (1.981 m), weight 119 kg, SpO2 96 %. Constitutional: No distress . Vital signs  reviewed. HEENT: EOMI, oral membranes moist Neck: supple Cardiovascular: RRR without murmur. No JVD    Respiratory/Chest: CTA Bilaterally without wheezes or rales. Normal effort    GI/Abdomen: BS +, non-tender, non-distended Ext: no clubbing, cyanosis, or edema Psych: cooperative Extremities: edema in extremities.  No tenderness in extremities. Neuro: Alert Motor: Bilateral upper extremities: 5/5 proximal distal Bilateral lower extremities: 4-/5 proximal distal   Skin: Multiple foot wounds. L foot- has ruptured blood blister 5th MTP base; also 4th toe is dry gangrene- all black eschar. Large sacral wound  Assessment/Plan: 1. Functional deficits which require 3+ hours per day of interdisciplinary therapy in a comprehensive inpatient rehab setting.  Physiatrist is providing close team supervision and 24 hour management of active medical problems listed below.  Physiatrist and rehab team continue to assess barriers to discharge/monitor patient progress toward functional and medical goals  Care Tool:  Bathing    Body parts bathed by patient: Right arm,Left arm,Chest,Abdomen,Right upper leg,Left upper leg,Face,Right lower leg,Left lower leg   Body parts bathed by helper: Front perineal area,Buttocks     Bathing assist Assist Level: Minimal Assistance - Patient > 75%     Upper Body Dressing/Undressing Upper body dressing   What is the patient wearing?: Pull over shirt    Upper body assist Assist Level: Set up assist    Lower Body Dressing/Undressing Lower body dressing      What is the patient wearing?: Pants     Lower body assist Assist for lower body dressing: Minimal Assistance - Patient > 75%     Toileting Toileting    Toileting assist Assist for toileting: Total Assistance - Patient < 25% Assistive Device Comment:  (urinal)   Transfers Chair/bed transfer  Transfers assist  Chair/bed transfer activity did not occur: Safety/medical concerns (Per nursing  report)  Chair/bed transfer assist level: Minimal Assistance - Patient > 75% Chair/bed transfer assistive device: Sliding board   Locomotion Ambulation   Ambulation assist   Ambulation activity did not occur: Safety/medical concerns (fatigue, lightheadedness, generalized weakness)  Assist level: Minimal Assistance - Patient > 75% Assistive device: Parallel bars Max distance: 24 ft   Walk 10 feet activity   Assist  Walk 10 feet activity did not occur: Safety/medical concerns (fatigue, lightheadedness, generalized weakness)  Assist level: Minimal Assistance - Patient > 75% Assistive device: Parallel bars   Walk 50 feet activity   Assist Walk 50 feet with 2 turns activity did not occur: Safety/medical concerns (fatigue, lightheadedness, generalized weakness)         Walk 150 feet activity   Assist Walk 150 feet activity did not occur: Safety/medical concerns (fatigue, lightheadedness, generalized weakness)         Walk 10 feet on uneven surface  activity   Assist Walk 10 feet on uneven surfaces activity did not occur: Safety/medical concerns (fatigue, lightheadedness, generalized weakness)         Wheelchair     Assist Will patient use  wheelchair at discharge?: Yes Type of Wheelchair: Manual    Wheelchair assist level: Supervision/Verbal cueing Max wheelchair distance: 100    Wheelchair 50 feet with 2 turns activity    Assist        Assist Level: Supervision/Verbal cueing   Wheelchair 150 feet activity     Assist      Assist Level: Minimal Assistance - Patient > 75%   Blood pressure 125/72, pulse 90, temperature (!) 97.5 F (36.4 C), resp. rate 16, height 6' 6"  (1.981 m), weight 119 kg, SpO2 96 %.  Medical Problem List and Plan: 1.  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  Antithrombotics: -DVT/anticoagulation: Subcutaneous heparin.               -antiplatelet therapy:  N/A 3. Pain Management: Tylenol as needed  2/11- denied pain- con't regimen 4. Mood: Provide emotional support             -antipsychotic agents: N/A 5. Neuropsych: This patient is? capable of making decisions on his own behalf. 6. Skin/Wound Care: Routine skin checks 7. Fluids/Electrolytes/Nutrition: Routine in and outs with follow-up chemistries 8.  ESRD.  Hemodialysis per renal services 9.  Sacral decubitus ulcer as well as ischemic ulcers on the left foot fourth toe and fifth metatarsal head and right heel and fifth metatarsal head.  Wound care nurse follow-up.  Patient to see Dr. Sharol Given outpatient.  Continue bilateral PRAFO's. 10.  ID/ coccygeal osteomyelitis.    Completed vancomycin and cefepime on 06/10/2020  1/18- will schedule turning q2 hours to get off wounds- also will make sure on specialty bed  1/20- is on specialty bed  Repeat labs ordered for Monday  2/7- CRP 6.5- ESR pending - con't regimen  2/8- ESR  DOWN TO 67 FROM 73- SO HEADING IN RIGHT DIRECTION  2/9- Spiked temp last night of 100.9- restarted Vanc/Cefipime- pt refused Vanc "since causes diarrhea". WBC now normal again this AM- will con't for now. CXR (-); is anuric-  2/10- having fevers/sweats/chills- Tm 99.4- needs to also take Vanc- told this to pt- also, called ID and also ordered another CT of pelvis due to Sx's-    2/13 ID saw pt Friday. Thinks that low grade temps are coming from HD cath. Was not impressed by MRI of pelvis. Recommended stopping abx (d/t loose stool)   -continue to monitor   -continue [facility] per nephrology   -afebrile last 36 hours 10.  Anemia of chronic disease.  Continue Aranesp.   Hemoglobin 10.0 on 2/3, labs with HD 11.  Diabetes mellitus with peripheral neuropathy .  Hemoglobin A1c 5.6.  Lantus insulin 11 units nightly  CBG (last 3)  Recent Labs    07/03/20 1914 07/03/20 2105 07/04/20 0618  GLUCAP 94 118* 97   2/12- cbg's under reasonable control 12.  Morbid obesity.  BMI 38.37.  Dietary  follow-up 13. Dizziness/nausea with HTN and hypotension  Vitals:   07/04/20 0536 07/04/20 0722  BP: (!) 151/80 125/72  Pulse: 96 90  Resp: 16 16  Temp: 98.5 F (36.9 C) (!) 97.5 F (36.4 C)  SpO2: 99% 96%    Hydralazine prn   Norvasc to 7.5 mg daily   2/9- BP 156/85 this AM- but has been controlled last few days  2/12-13-bps still with some fluctuation but not severe. Continue current meds, volume mgt per nephrology 14. Diarrhea with new constipation  Bowel meds increased on 2/5  2/7- will decrease bowel meds again since  8+ BMs in last 24 hours- con't regimen  2/12 stool still mushy. Stopped miralax 15. Leukocytosis: Resolved  ESR/CRP and CBC with diff ordered for Monday.  2/7- CRP 6.5- stable- con't regimen   2/8- EST 67- down to 73- con't regimen  2/11- Labs looking slightly better, but imaging does not- waiting to hear from ID   16. N/Vomiting-  2/10- likely due to underlying illness- will do ID w/u as above.  16. Dispo  2/8- pt unhappy that has to back to SNF- however has been participating intermittently- finally doing more/more interactive, but we can only keep max of 30 days. Sent FL2  2/9- pt refused PT yesterday -only bedlevel Exercises- pt needs max encouragement to progress with therapy   2/10- might have to hold d/c- we will see.  2/11- might not be able to d/c Monday per SNF search/SW   LOS: 28 days A FACE TO [place] 07/04/2020, 8:26 AM

## 2020-07-04 NOTE — Progress Notes (Signed)
[facility] Progress Note   Subjective:   Sitting on bedside, getting ready to start PT. Reports he is dizzy right now. Recorded BP all moderately hypertensive. Pt denies SOB, CP, abdominal pain and nausea.   Objective Vitals:   07/03/20 1931 07/03/20 2049 07/04/20 0536 07/04/20 0722  BP: (!) 176/94 (!) 144/93 (!) 151/80 125/72  Pulse: 89 (!) 101 96 90  Resp: '20  16 16  '$ Temp: 98.6 F (37 C)  98.5 F (36.9 C) (!) 97.5 F (36.4 C)  TempSrc:      SpO2: 98%  99% 96%  Weight:      Height:       Physical Exam General:Well developed, ill appearing male in NAD Heart:RRR, no murmurs, rubs or gallops Lungs:CTA bilaterally, respirations unlabored on RA Abdomen:Soft, non-tender, non-distended, +BS, wearing abdominal binder Extremities:No edema b/l lower extremities Dialysis Access:R IJ TDC clean bandage, no erythema/drainage  Additional Objective Labs: Basic Metabolic Panel: Recent Labs  Lab 06/29/20 1645 07/01/20 1229 07/03/20 1654  NA 133* 134* 135  K 4.8 4.5 4.9  CL 96* 98 98  CO2 '25 25 25  '$ GLUCOSE 119* 110* 139*  BUN 63* 47* 51*  CREATININE 7.51* 6.79* 6.14*  CALCIUM 9.9 9.0 9.0  PHOS 4.7* 4.2 4.0   Liver Function Tests: Recent Labs  Lab 06/29/20 1645 07/01/20 1229 07/03/20 1654  ALBUMIN 2.3* 2.1* 2.0*   No results for input(s): LIPASE, AMYLASE in the last 168 hours. CBC: Recent Labs  Lab 06/28/20 0537 06/29/20 1645 06/30/20 0513 07/01/20 1229 07/03/20 1654  WBC 9.3 10.9* 7.5 10.5 10.4  NEUTROABS 5.7  --  4.8  --   --   HGB 10.9* 10.5* 9.8* 9.8* 9.9*  HCT 34.0* 34.5* 30.9* 32.4* 33.0*  MCV 84.0 83.9 83.5 83.9 84.6  PLT 330 323 272 311 330   Blood Culture    Component Value Date/Time   SDES SACRAL 06/30/2020 0013   SPECREQUEST NONE 06/30/2020 0013   CULT MULTIPLE ORGANISMS PRESENT, NONE PREDOMINANT (A) 06/30/2020 0013   REPTSTATUS 07/02/2020 FINAL 06/30/2020 0013    Cardiac Enzymes: No results for input(s): CKTOTAL, CKMB,  CKMBINDEX, TROPONINI in the last 168 hours. CBG: Recent Labs  Lab 07/03/20 0607 07/03/20 1153 07/03/20 1914 07/03/20 2105 07/04/20 0618  GLUCAP 96 104* 94 118* 97   Iron Studies:  Recent Labs    07/01/20 1229  IRON 10*  TIBC 125*   '@lablastinr3'$ @ Studies/Results: No results found. Medications: . iron sucrose Stopped (07/03/20 1835)   . (feeding supplement) PROSource Plus  30 mL Oral TID BM  . amLODipine  5 mg Oral QHS  . Chlorhexidine Gluconate Cloth  6 each Topical BID  . Chlorhexidine Gluconate Cloth  6 each Topical Q0600  . darbepoetin (ARANESP) injection - DIALYSIS  200 mcg Intravenous Q Tue-HD  . famotidine  20 mg Oral QHS  . feeding supplement (NEPRO CARB STEADY)  237 mL Oral TID BM  . heparin injection (subcutaneous)  7,500 Units Subcutaneous Q8H  . insulin aspart  0-9 Units Subcutaneous TID WC  . insulin glargine  11 Units Subcutaneous QHS  . lanthanum  500 mg Oral TID WC  . multivitamin  1 tablet Oral QHS  . pneumococcal 23 valent vaccine  0.5 mL Intramuscular Tomorrow-1000    Dialysis Orders: NWTTS 4h 400/800 143.5kg 3K/2.5Ca TDCHeparin3000units IV TIW - Mircera116mgIVq2wks - last 12/8 - Venofer '100mg'$ IVqHD x10 - completed 6/10 doses  Assessment/Plan: 1. Debilitation with history of prolonged hospitalization COVID 04/20/2020 sacral decub/coccygealosteo:S/p 6 weeks  ofVanc/Cefepime (finished1/20/22) - In CIR. 2. ESRDHD: Continue TTS schedule. Inpatient dialysis treatments are truncated due to high patient census at this time. 3. 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 Low-grade temperature: Now resolved. Blood cultures negative. ID w/low suspicion for pneumonia. Recommended stopping antibiotics due to diarrhea. No indication to remove TDC at this time since blood cultures are negative, patient remains afebrile without leukocytosis. Will eventually need permanent dialysis access once more  stable. 5. Diarrhea -Recurrent issue,currently improved. initially thoughtd/t Renvelabut no improvement after binders were switched. ID recommends stopping antibiotics and monitoring. 6. BP/volume:BP high on admission but continues to have issues with orthostatic BP with therapy despite minimal/no UF with HD. PT has documented orthostatic blood pressures 2/11. Amlodipine decreased to '5mg'$  on 2/11. Monitor BP/orthostatics over the next couple days, if dizziness does not correlate with low BP can likely titrate amlodipine back up.  7. Anemiaof CKD: Hgb9.9.continue Aranesp 237mgq Sat. 8. Secondary Hyperparathyroidism:CorrCaelevated but improving,use low Ca bath.Not on VDRA.onFosrenolwith phosphorus at goal. 9. Nutrition:Albremains low, continue Nepro. 10. DMT2 - per admit   SAnice Paganini PA-C 07/04/2020, 8:59 AM  [facility] Pager: [phone]

## 2020-07-04 NOTE — Progress Notes (Signed)
Occupational Therapy Session Note  [name] MRN: [id number] Date of Birth: 02/20/1971  Today's Date: 07/04/2020 OT Individual Time: [id number] OT Individual Time Calculation (min): 57 min   Skilled Therapeutic Interventions/Progress Updates:    Pt greeted in bed, reporting that he had a hypotensive episode when he was sitting up in the chair earlier in therapy. He also reports frequent bowel incontinence, loose liquid stool from antibiotics. Started session with perihygiene and brief change, pt able to assist with lowering pants and hygiene in the front. After OT completed hygiene in the back, RN in to change his soiled wound dressings. Pt had 2nd episode of bowel incontinence so hygiene was completed a 2nd time, pt rolling Rt>Lt with supervision assist. OT then wrapped pts LEs with ACE wraps and pt completed supine<sit unassisted. Pt motivated to stand today vs transfer to the w/c, requesting to stand with bariatric Stedy vs walker due to increased resting dizziness today. Sit<stand x3 completed with Max A using bariatric Stedy with bed height significantly elevated. Worked on pt weight shifting Lt>Rt and removing UE support on the bar of Stedy to strengthen LEs and to challenge balance. Pt able to stand for 40 sec, 45 sec, and 1 minute 5 seconds. Standing endurance mostly limited by LE weakness. Pt reported moderate dizziness post stands and we waited until dizziness became mild before standing again. Pt returned to bed and ACE wraps were removed. He was left in care of RN to for perihygiene needs due to bowel incontinence in standing.   Therapy Documentation Precautions:  Precautions Precautions: Fall Precaution Comments: wounds on bilat feet and L sacrum, inconsistent orthostatic BPs Required Braces or Orthoses: Other Brace Other Brace: abdominal binder PRN with standing/strenuous activity Restrictions Weight Bearing Restrictions: No Vital Signs: Therapy  Vitals Temp: 98.8 F (37.1 C) Pulse Rate: 94 Resp: 18 BP: (!) 153/95 Patient Position (if appropriate): Lying Oxygen Therapy SpO2: 100 % O2 Device: Room Air Pain: no c/o pain during tx ADL: ADL Eating: Set up (per staff report) Where Assessed-Eating: Bed level Grooming: Modified independent Where Assessed-Grooming: Sitting at sink,Wheelchair Upper Body Bathing: Setup Where Assessed-Upper Body Bathing: Edge of bed Lower Body Bathing: Minimal assistance Where Assessed-Lower Body Bathing: Bed level Upper Body Dressing: Setup Where Assessed-Upper Body Dressing: Sitting at sink,Wheelchair Lower Body Dressing: Setup Where Assessed-Lower Body Dressing: Bed level Toileting: Maximal assistance Where Assessed-Toileting: Bed level Toilet Transfer: Not assessed (not assessed d/t safety)     Therapy/Group: Individual Therapy  Melinda Gwinner A Lida Berkery 07/04/2020, 4:09 PM

## 2020-07-05 LAB — CBC WITH DIFFERENTIAL/PLATELET
Abs Immature Granulocytes: 0.12 10*3/uL — ABNORMAL HIGH (ref 0.00–0.07)
Basophils Absolute: 0.1 10*3/uL (ref 0.0–0.1)
Basophils Relative: 1 %
Eosinophils Absolute: 0.4 10*3/uL (ref 0.0–0.5)
Eosinophils Relative: 4 %
HCT: 33.3 % — ABNORMAL LOW (ref 39.0–52.0)
Hemoglobin: 10 g/dL — ABNORMAL LOW (ref 13.0–17.0)
Immature Granulocytes: 1 %
Lymphocytes Relative: 20 %
Lymphs Abs: 2.1 10*3/uL (ref 0.7–4.0)
MCH: 25.2 pg — ABNORMAL LOW (ref 26.0–34.0)
MCHC: 30 g/dL (ref 30.0–36.0)
MCV: 83.9 fL (ref 80.0–100.0)
Monocytes Absolute: 1.1 10*3/uL — ABNORMAL HIGH (ref 0.1–1.0)
Monocytes Relative: 11 %
Neutro Abs: 6.5 10*3/uL (ref 1.7–7.7)
Neutrophils Relative %: 63 %
Platelets: 300 10*3/uL (ref 150–400)
RBC: 3.97 MIL/uL — ABNORMAL LOW (ref 4.22–5.81)
RDW: 16 % — ABNORMAL HIGH (ref 11.5–15.5)
WBC: 10.2 10*3/uL (ref 4.0–10.5)
nRBC: 0 % (ref 0.0–0.2)

## 2020-07-05 LAB — HEMOGLOBIN AND HEMATOCRIT, BLOOD
HCT: 31.8 % — ABNORMAL LOW (ref 39.0–52.0)
Hemoglobin: 10 g/dL — ABNORMAL LOW (ref 13.0–17.0)

## 2020-07-05 LAB — RENAL FUNCTION PANEL
Albumin: 1.9 g/dL — ABNORMAL LOW (ref 3.5–5.0)
Anion gap: 10 (ref 5–15)
BUN: 47 mg/dL — ABNORMAL HIGH (ref 6–20)
CO2: 26 mmol/L (ref 22–32)
Calcium: 9.1 mg/dL (ref 8.9–10.3)
Chloride: 99 mmol/L (ref 98–111)
Creatinine, Ser: 5.46 mg/dL — ABNORMAL HIGH (ref 0.61–1.24)
GFR, Estimated: 12 mL/min — ABNORMAL LOW (ref >60)
Glucose, Bld: 113 mg/dL — ABNORMAL HIGH (ref 70–99)
Phosphorus: 3.9 mg/dL (ref 2.5–4.6)
Potassium: 4.3 mmol/L (ref 3.5–5.1)
Sodium: 135 mmol/L (ref 135–145)

## 2020-07-05 LAB — CBC
HCT: 33.7 % — ABNORMAL LOW (ref 39.0–52.0)
Hemoglobin: 10.2 g/dL — ABNORMAL LOW (ref 13.0–17.0)
MCH: 25.9 pg — ABNORMAL LOW (ref 26.0–34.0)
MCHC: 30.3 g/dL (ref 30.0–36.0)
MCV: 85.5 fL (ref 80.0–100.0)
Platelets: 336 10*3/uL (ref 150–400)
RBC: 3.94 MIL/uL — ABNORMAL LOW (ref 4.22–5.81)
RDW: 16.1 % — ABNORMAL HIGH (ref 11.5–15.5)
WBC: 11.1 10*3/uL — ABNORMAL HIGH (ref 4.0–10.5)
nRBC: 0 % (ref 0.0–0.2)

## 2020-07-05 LAB — GLUCOSE, CAPILLARY
Glucose-Capillary: 106 mg/dL — ABNORMAL HIGH (ref 70–99)
Glucose-Capillary: 147 mg/dL — ABNORMAL HIGH (ref 70–99)
Glucose-Capillary: 171 mg/dL — ABNORMAL HIGH (ref 70–99)
Glucose-Capillary: 102 mg/dL — ABNORMAL HIGH (ref 70–99)

## 2020-07-05 LAB — CULTURE, BLOOD (ROUTINE X 2)
Culture: NO GROWTH
Culture: NO GROWTH
Special Requests: ADEQUATE
Special Requests: ADEQUATE

## 2020-07-05 LAB — C-REACTIVE PROTEIN: CRP: 7.2 mg/dL — ABNORMAL HIGH (ref <1.0)

## 2020-07-05 LAB — OCCULT BLOOD X 1 CARD TO LAB, STOOL
Fecal Occult Bld: POSITIVE — AB
Fecal Occult Bld: POSITIVE — AB

## 2020-07-05 LAB — SEDIMENTATION RATE: Sed Rate: 75 mm/hr — ABNORMAL HIGH (ref 0–16)

## 2020-07-05 MED ORDER — LOPERAMIDE HCL 2 MG PO CAPS
2.0000 mg | ORAL_CAPSULE | ORAL | Status: DC | PRN
  Administered 2020-07-06: 2 mg via ORAL
  Filled 2020-07-05: qty 1

## 2020-07-05 MED ORDER — COLLAGENASE 250 UNIT/GM EX OINT
TOPICAL_OINTMENT | Freq: Every day | CUTANEOUS | Status: DC
  Administered 2020-07-05: 1 via TOPICAL
  Filled 2020-07-05: qty 30

## 2020-07-05 NOTE — Progress Notes (Signed)
Occupational Therapy Session Note  [name] MRN: [id number] Date of Birth: 1970/09/03  Today's Date: 07/05/2020 OT Individual Time: [id number] OT Individual Time Calculation (min): 57 min   Skilled Therapeutic Interventions/Progress Updates:    Pt greeted in bed with no c/o pain but reporting bowel incontinence in brief. Pt able to lower pants himself and also unfasten brief. He completed frontal hygiene and then OT assisted with posterior hygiene for wound safety. Note that pt was incontinent for a 2nd time which required additional hygiene. His wound dressings remained clean so brief was then donned, pt rolling Rt>Lt with supervision assist. When OT began wrapping his legs with ACE wraps, noted exposed wound on foot. RN in to assess and redress. He utilized reclined figure 4 position to don gripper socks, needing Min A for the Rt sock only. Supine<sit completed with increased time, pt reporting more weakness today compared to previous sessions. Attempted sit<stand using RW x2 with bed significantly elevated, pt unable to achieve full stand with Max A of 1. Pt stating dizziness was "ok" today. He returned to bed and was left with all needs within reach. Tx focus placed on general strengthening via functional bed mobility, activity tolerance, and functional transfer training.   Therapy Documentation Precautions:  Precautions Precautions: Fall Precaution Comments: wounds on bilat feet and L sacrum, inconsistent orthostatic BPs Required Braces or Orthoses: Other Brace Other Brace: abdominal binder PRN with standing/strenuous activity Restrictions Weight Bearing Restrictions: No Vital Signs:  Pain: Pain Assessment Pain Score: 0-No pain ADL: ADL Eating: Set up (per staff report) Where Assessed-Eating: Bed level Grooming: Modified independent Where Assessed-Grooming: Sitting at sink,Wheelchair Upper Body Bathing: Setup Where Assessed-Upper Body Bathing: Edge of  bed Lower Body Bathing: Minimal assistance Where Assessed-Lower Body Bathing: Bed level Upper Body Dressing: Setup Where Assessed-Upper Body Dressing: Sitting at sink,Wheelchair Lower Body Dressing: Setup Where Assessed-Lower Body Dressing: Bed level Toileting: Maximal assistance Where Assessed-Toileting: Bed level Toilet Transfer: Not assessed (not assessed d/t safety)      Therapy/Group: Individual Therapy  Harvey Lingo A Margretta Zamorano 07/05/2020, 11:52 AM

## 2020-07-05 NOTE — Progress Notes (Signed)
Occupational Therapy Session Note  [name] MRN: [id number] Date of Birth: Sep 03, 1970  Today's Date: 07/05/2020 OT Individual Time: 9833-8250 OT Individual Time Calculation (min): 54 min    Short Term Goals: Week 2:  OT Short Term Goal 1 (Week 2): Pt will tolerate sitting OOB for 2-3 hours OT Short Term Goal 1 - Progress (Week 2): Progressing toward goal OT Short Term Goal 2 (Week 2): Pt will complete LB bathing and dressing with Min A w/ AE PRN OT Short Term Goal 2 - Progress (Week 2): Met OT Short Term Goal 3 (Week 2): Pt will complete transfer to [facility] with LRAD with Min A OT Short Term Goal 3 - Progress (Week 2): Progressing toward goal OT Short Term Goal 4 (Week 2): Pt will perform sit to stand at [facility] with Max A of 1 consistently OT Short Term Goal 4 - Progress (Week 2): Met Week 3:  OT Short Term Goal 1 (Week 3): STGs=LTGs due to ELOS OT Short Term Goal 1 - Progress (Week 3): Progressing toward goal Week 4:  OT Short Term Goal 1 (Week 4): STGs=LTGs due to ELOS   Skilled Therapeutic Interventions/Progress Updates:    Pt greeted at time of session supine in bed, NT and RN present for vitals check, taking time initially at beginning of session d/t first check with elevated BP and second check BP WNL. Pt c/o fatigue today as well as soreness/raw feeling around buttocks d/t constant diarrhea today and frequent brief changes. Needing brief change at beginning of session, rolled Supervision L/R for therapist to complete dependentl brief change, also applied barrier cream with pt ed on purpose. Pt donned pants back over hips without assist at bed level. Pt declined any OOB or sitting EOB activities today, stating that he did not feel well enough despite education on importance. Agreeable to bed level therex, HOB elevated to tolerance for as much upright sitting as possible, approx 30*. 2x15-20 reps of chest press, bicep curl, overhead raise, FWD and backward circles  with 2# dowel all with cues for form. Pt in bed resting with call bell in reach all needs met.   Therapy Documentation Precautions:  Precautions Precautions: Fall Precaution Comments: wounds on bilat feet and L sacrum, inconsistent orthostatic BPs Required Braces or Orthoses: Other Brace Other Brace: abdominal binder PRN with standing/strenuous activity Restrictions Weight Bearing Restrictions: No     Therapy/Group: Individual Therapy  [name] 07/05/2020, 3:20 PM

## 2020-07-05 NOTE — Progress Notes (Signed)
[place] PHYSICAL MEDICINE & REHABILITATION PROGRESS NOTE   Subjective/Complaints:  Said had diarrhea yesterday 5-6x- per pt. Per chart, has 5 mushy stools and 1 liquid stools documented in last 24 hours.   Denies any other issues.     ROS:  Pt denies SOB, abd pain, CP, N/V/C/D, and vision changes   Objective:   No results found. Recent Labs    07/03/20 1654 07/05/20 0555  WBC 10.4 10.2  HGB 9.9* 10.0*  HCT 33.0* 33.3*  PLT 330 300   Recent Labs    07/03/20 1654 07/05/20 0555  NA 135 135  K 4.9 4.3  CL 98 99  CO2 25 26  GLUCOSE 139* 113*  BUN 51* 47*  CREATININE 6.14* 5.46*  CALCIUM 9.0 9.1    Intake/Output Summary (Last 24 hours) at 07/05/2020 0853 Last data filed at 07/04/2020 2200 Gross per 24 hour  Intake 1109.3 ml  Output 250 ml  Net 859.3 ml     Pressure Injury 05/01/20 Buttocks Left Stage 4 - Full thickness tissue loss with exposed bone, tendon or muscle. packed with santyl gauze, dressed by WOCN, (Active)  05/01/20 0230  Location: Buttocks  Location Orientation: Left  Staging: Stage 4 - Full thickness tissue loss with exposed bone, tendon or muscle.  Wound Description (Comments): packed with santyl gauze, dressed by WOCN,  Present on Admission: Yes     Pressure Injury 05/01/20 Ankle Left;Lateral Unstageable - Full thickness tissue loss in which the base of the injury is covered by slough (yellow, tan, gray, green or brown) and/or eschar (tan, brown or black) in the wound bed. smal circular wound on the (Active)  05/01/20 0230  Location: Ankle  Location Orientation: Left;Lateral  Staging: Unstageable - Full thickness tissue loss in which the base of the injury is covered by slough (yellow, tan, gray, green or brown) and/or eschar (tan, brown or black) in the wound bed.  Wound Description (Comments): smal circular wound on the bone  Present on Admission: Yes     Pressure Injury 05/01/20 Foot Left;Lateral Unstageable - Full thickness tissue loss  in which the base of the injury is covered by slough (yellow, tan, gray, green or brown) and/or eschar (tan, brown or black) in the wound bed. 4th and 5th toes appear nec (Active)  05/01/20 0230  Location: Foot (4th and 5th toes and lateral/anterior side of the sole)  Location Orientation: Left;Lateral  Staging: Unstageable - Full thickness tissue loss in which the base of the injury is covered by slough (yellow, tan, gray, green or brown) and/or eschar (tan, brown or black) in the wound bed.  Wound Description (Comments): 4th and 5th toes appear necrotic circular wound on sole  Present on Admission: Yes     Pressure Injury 05/01/20 Foot Right;Medial;Posterior Unstageable - Full thickness tissue loss in which the base of the injury is covered by slough (yellow, tan, gray, green or brown) and/or eschar (tan, brown or black) in the wound bed. large circular wo (Active)  05/01/20 0230  Location: Foot  Location Orientation: Right;Medial;Posterior  Staging: Unstageable - Full thickness tissue loss in which the base of the injury is covered by slough (yellow, tan, gray, green or brown) and/or eschar (tan, brown or black) in the wound bed.  Wound Description (Comments): large circular wound on sole  and heel  Present on Admission: Yes     Pressure Injury 05/08/20 Heel Right Unstageable - Full thickness tissue loss in which the base of the injury is covered  by slough (yellow, tan, gray, green or brown) and/or eschar (tan, brown or black) in the wound bed. (Active)  05/08/20 1718  Location: Heel  Location Orientation: Right  Staging: Unstageable - Full thickness tissue loss in which the base of the injury is covered by slough (yellow, tan, gray, green or brown) and/or eschar (tan, brown or black) in the wound bed.  Wound Description (Comments):   Present on Admission: Yes    Physical Exam: Vital Signs Blood pressure (!) 152/87, pulse 97, temperature 98.3 F (36.8 C), resp. rate 20, height 6' 6"   (1.981 m), weight 119 kg, SpO2 100 %. Constitutional: No distress . Vital signs reviewed. Laying in bed- tired- doesn't want to wake up, NAD HEENT: EOMI, oral membranes moist Neck: supple Cardiovascular: RRR_ no JVD   Respiratory/Chest: CTA B/L- no W/R/R- good air movement   GI/Abdomen: Soft, NT, ND, (+)BS -normoactive Ext: no clubbing, cyanosis, or edema Psych: cooperative, but sleepy- and frustrated about stools Extremities: edema in extremities.  No tenderness in extremities. Neuro: Alert Motor: Bilateral upper extremities: 5/5 proximal distal Bilateral lower extremities: 4-/5 proximal distal   Skin: Multiple foot wounds. L foot- has ruptured blood blister 5th MTP base; also 4th toe is dry gangrene- all black eschar. Large sacral wound  Assessment/Plan: 1. Functional deficits which require 3+ hours per day of interdisciplinary therapy in a comprehensive inpatient rehab setting.  Physiatrist is providing close team supervision and 24 hour management of active medical problems listed below.  Physiatrist and rehab team continue to assess barriers to discharge/monitor patient progress toward functional and medical goals  Care Tool:  Bathing    Body parts bathed by patient: Right arm,Left arm,Chest,Abdomen,Right upper leg,Left upper leg,Face,Right lower leg,Left lower leg   Body parts bathed by helper: Front perineal area,Buttocks     Bathing assist Assist Level: Minimal Assistance - Patient > 75%     Upper Body Dressing/Undressing Upper body dressing   What is the patient wearing?: Pull over shirt    Upper body assist Assist Level: Set up assist    Lower Body Dressing/Undressing Lower body dressing      What is the patient wearing?: Pants     Lower body assist Assist for lower body dressing: Minimal Assistance - Patient > 75%     Toileting Toileting    Toileting assist Assist for toileting: Total Assistance - Patient < 25% Assistive Device Comment:  (urinal)    Transfers Chair/bed transfer  Transfers assist  Chair/bed transfer activity did not occur: Safety/medical concerns (Per nursing report)  Chair/bed transfer assist level: Minimal Assistance - Patient > 75% Chair/bed transfer assistive device: Sliding board,Armrests   Locomotion Ambulation   Ambulation assist   Ambulation activity did not occur: Safety/medical concerns (fatigue, lightheadedness, generalized weakness)  Assist level: Minimal Assistance - Patient > 75% Assistive device: Parallel bars Max distance: 24 ft   Walk 10 feet activity   Assist  Walk 10 feet activity did not occur: Safety/medical concerns (fatigue, lightheadedness, generalized weakness)  Assist level: Minimal Assistance - Patient > 75% Assistive device: Parallel bars   Walk 50 feet activity   Assist Walk 50 feet with 2 turns activity did not occur: Safety/medical concerns (fatigue, lightheadedness, generalized weakness)         Walk 150 feet activity   Assist Walk 150 feet activity did not occur: Safety/medical concerns (fatigue, lightheadedness, generalized weakness)         Walk 10 feet on uneven surface  activity   Assist Walk  10 feet on uneven surfaces activity did not occur: Safety/medical concerns (fatigue, lightheadedness, generalized weakness)         Wheelchair     Assist Will patient use wheelchair at discharge?: Yes Type of Wheelchair: Manual    Wheelchair assist level: Supervision/Verbal cueing Max wheelchair distance: 100    Wheelchair 50 feet with 2 turns activity    Assist        Assist Level: Supervision/Verbal cueing   Wheelchair 150 feet activity     Assist      Assist Level: Minimal Assistance - Patient > 75%   Blood pressure (!) 152/87, pulse 97, temperature 98.3 F (36.8 C), resp. rate 20, height 6' 6"  (1.981 m), weight 119 kg, SpO2 100 %.  Medical Problem List and Plan: 1.  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  Antithrombotics: -DVT/anticoagulation: Subcutaneous heparin.               -antiplatelet therapy: N/A 3. Pain Management: Tylenol as needed  2/11- denied pain- con't regimen 4. Mood: Provide emotional support             -antipsychotic agents: N/A 5. Neuropsych: This patient is? capable of making decisions on his own behalf. 6. Skin/Wound Care: Routine skin checks 7. Fluids/Electrolytes/Nutrition: Routine in and outs with follow-up chemistries 8.  ESRD.  Hemodialysis per renal services 9.  Sacral decubitus ulcer as well as ischemic ulcers on the left foot fourth toe and fifth metatarsal head and right heel and fifth metatarsal head.  Wound care nurse follow-up.  Patient to see Dr. Sharol Given outpatient.  Continue bilateral PRAFO's. 10.  ID/ coccygeal osteomyelitis.    Completed vancomycin and cefepime on 06/10/2020  1/18- will schedule turning q2 hours to get off wounds- also will make sure on specialty bed  1/20- is on specialty bed  Repeat labs ordered for Monday  2/7- CRP 6.5- ESR pending - con't regimen  2/8- ESR  DOWN TO 67 FROM 73- SO HEADING IN RIGHT DIRECTION  2/9- Spiked temp last night of 100.9- restarted Vanc/Cefipime- pt refused Vanc "since causes diarrhea". WBC now normal again this AM- will con't for now. CXR (-); is anuric-  2/10- having fevers/sweats/chills- Tm 99.4- needs to also take Vanc- told this to pt- also, called ID and also ordered another CT of pelvis due to Sx's-    2/13 ID saw pt Friday. Thinks that low grade temps are coming from HD cath. Was not impressed by MRI of pelvis. Recommended stopping abx (d/t loose stool)   -continue to monitor   -continue [facility] per nephrology   -afebrile last 36 hours  2/14- No fevers even low grafde x 48+ hours- con't off ABX.  10.  Anemia of chronic disease.  Continue Aranesp.   Hemoglobin 10.0 on 2/3, labs with HD 11.  Diabetes mellitus with peripheral neuropathy .  Hemoglobin A1c 5.6.  Lantus  insulin 11 units nightly  CBG (last 3)  Recent Labs    07/04/20 1637 07/04/20 2144 07/05/20 0627  GLUCAP 158* 163* 106*   2/12- cbg's under reasonable contro  2/14- BGs 106-163- con't regimen 12.  Morbid obesity.  BMI 38.37.  Dietary follow-up 13. Dizziness/nausea with HTN and hypotension  Vitals:   07/04/20 2105 07/05/20 0430  BP: (!) 177/96 (!) 152/87  Pulse: 99 97  Resp:  20  Temp:  98.3 F (36.8 C)  SpO2: 100% 100%    Hydralazine prn   Norvasc to 7.5 mg daily  2/9- BP 156/85 this AM- but has been controlled last few days  2/12-13-bps still with some fluctuation but not severe. Continue current meds, volume mgt per nephrology 14. Diarrhea with new constipation  Bowel meds increased on 2/5  2/7- will decrease bowel meds again since 8+ BMs in last 24 hours- con't regimen  2/12 stool still mushy. Stopped miralax  2/14- will allow them to use the Imodium prn that's ordered- since still having so many stools- he does this from IV ABX 15. Leukocytosis: Resolved  ESR/CRP and CBC with diff ordered for Monday.  2/7- CRP 6.5- stable- con't regimen   2/8- EST 67- down to 73- con't regimen  2/11- Labs looking slightly better, but imaging does not- waiting to hear from ID  2/14- per ID, pelvis osteomyelitis not the issue- off ABX per their request 16. N/Vomiting-  2/10- likely due to underlying illness- will do ID w/u as above.  16. Dispo  2/8- pt unhappy that has to back to SNF- however has been participating intermittently- finally doing more/more interactive, but we can only keep max of 30 days. Sent FL2  2/9- pt refused PT yesterday -only bedlevel Exercises- pt needs max encouragement to progress with therapy   2/10- might have to hold d/c- we will see.  2/11- might not be able to d/c Monday per SNF search/SW   2/14- medical issues stable- can send to SNF   LOS: 29 days A FACE TO FACE EVALUATION WAS PERFORMED  Avriana Joo 07/05/2020, 8:53 AM

## 2020-07-05 NOTE — Progress Notes (Addendum)
Patient reported to have "red looking stool"-stat CBC ordered and still pending. Nurse to call labs for to get blood drawn. Stool heme positive X 2. Will order every 8 hours H/H to monitor for stability.  Vitals stable.  GI contacted and recommended monitoring 5a/5p. Diarrhea/ abdominal discomfort felt to be antibiotic related and they will follow up in am for consult. Nurse informed to contact provider on call for any changes in vitals or  hematochezia/bloody/maroon stools.

## 2020-07-05 NOTE — Progress Notes (Signed)
Patient ID: Fred [name], male   DOB: 08-09-1970, 50 y.o.   MRN: [id number] Have left message for [facility] and rehab regarding offering a bed. Await return call

## 2020-07-05 NOTE — Progress Notes (Signed)
Physical Therapy Session Note  [name] MRN: [id number] Date of Birth: [birth year]  Today's Date: 07/05/2020 PT Individual Time: [id number] PT Individual Time Calculation (min): 27 min   Short Term Goals: Week 4:  PT Short Term Goal 1 (Week 4): pt to demonstrate min A STS from various surfaces with RW PT Short Term Goal 2 (Week 4): pt to demonstrate gait training with RW mod A for 15'  Skilled Therapeutic Interventions/Progress Updates:    Patient in supine and reports frequent stools with diarrhea.  Patient stayed in bed for therex as noted below. Patient reported soiled again so pt doffed pants in supine and assisted to doff brief; rolled to R and total A for hygiene and assisted mod A to place clean brief, pt pulled up pants with S.  Left in supine with call bell and needs in reach.   Therapy Documentation Precautions:  Precautions Precautions: Fall Precaution Comments: wounds on bilat feet and L sacrum, inconsistent orthostatic BPs Required Braces or Orthoses: Other Brace Other Brace: abdominal binder PRN with standing/strenuous activity Restrictions Weight Bearing Restrictions: No Pain: Pain Assessment Pain Score: 0-No pain   Exercises: General Exercises - Upper Extremity Shoulder Flexion: Strengthening;Both;10 reps;Supine;Bar weights/barbell (chest press x 2 sets 5 # bar) Elbow Flexion: Strengthening;Both;10 reps;Supine;Bar weights/barbell (x 2 sets 5# bar) General Exercises - Lower Extremity Short Arc Quad: Strengthening;Both;Supine;10 reps (2# weights x 2 sets) Hip ABduction/ADduction: Strengthening;Both;10 reps;Supine (hooklying w/ blue t-band x 2 sets) Straight Leg Raises: Strengthening;Both;10 reps;Supine (x 2 sets) Total Joint Exercises Straight Leg Raises: Strengthening;Both;10 reps;Supine (x 2 sets) Bridges: Strengthening;Both;10 reps;Supine (x 2 sets) Other Exercises Other Exercises: lateral trunk rotation in supine x  10    Therapy/Group: Individual Therapy  Reginia Naas  Magda Kiel, PT 07/05/2020, 12:20 PM

## 2020-07-05 NOTE — Progress Notes (Signed)
Physical Therapy Weekly Progress Note  [name] MRN: [id number] Date of Birth: 02-27-1971  Beginning of progress report period: June 28, 2020 End of progress report period: July 05, 2020  Today's Date: 07/05/2020 PT Individual Time: 7829-5621 PT Individual Time Calculation (min): 40 min   Patient has met 0 of 2 short term goals.  Pt continues to be limited in participation with PT and progression with PT this previous week, 2/2 fatigue, nausea, vomiting, diarrhea, inconsistent BP with hyper and hypotensive. Pt is most recently max A for Sit to stand transfers and min A-CGA for slide board transfers however has demonstrated improved levels of assist during stay. Pt has ambulated very short distances with // and Rolling walker mod A however this too has been limited to above reasons.   Patient continues to demonstrate the following deficits muscle weakness, decreased cardiorespiratoy endurance and decreased sitting balance, decreased standing balance, decreased postural control and decreased balance strategies and therefore will continue to benefit from skilled PT intervention to increase functional independence with mobility.  Patient progressing toward long term goals..  Continue plan of care.  PT Short Term Goals Week 1:  PT Short Term Goal 1 (Week 1): pt will perform all bed mobility with CGA PT Short Term Goal 1 - Progress (Week 1): Not met PT Short Term Goal 2 (Week 1): Pt will transfer sit<>stand with LRAD and mod A PT Short Term Goal 2 - Progress (Week 1): Not met PT Short Term Goal 3 (Week 1): Pt will transfer bed<>chair with LRAD and min A PT Short Term Goal 3 - Progress (Week 1): Not met Week 2:  PT Short Term Goal 1 (Week 2): Pt will transfer bed<>chair with LRAD and min A PT Short Term Goal 1 - Progress (Week 2): Progressing toward goal PT Short Term Goal 2 (Week 2): Pt will transfer sit<>stand with LRAD and mod A PT Short Term Goal 2 -  Progress (Week 2): Met PT Short Term Goal 3 (Week 2): pt will perform all bed mobility with CGA PT Short Term Goal 3 - Progress (Week 2): Progressing toward goal PT Short Term Goal 4 (Week 2): pt will recall pressure relief schedule and safe techniques with pressure relief PT Short Term Goal 4 - Progress (Week 2): Met PT Short Term Goal 5 (Week 2): pt will tolerate sitting OOB for 45 mins without adverse effects PT Short Term Goal 5 - Progress (Week 2): Met Week 3:  PT Short Term Goal 1 (Week 3): Pt will transfer bed<>chair with LRAD and min A PT Short Term Goal 1 - Progress (Week 3): Met (with slide board, mod A with RW) PT Short Term Goal 2 (Week 3): Pt will transfer sit<>stand with LRAD and mod A from WC PT Short Term Goal 2 - Progress (Week 3): Met PT Short Term Goal 3 (Week 3): pt will perform all bed mobility with CGA PT Short Term Goal 3 - Progress (Week 3): Met Week 4:  PT Short Term Goal 1 (Week 4): pt to demonstrate min A STS from various surfaces with RW PT Short Term Goal 1 - Progress (Week 4): Not met PT Short Term Goal 2 (Week 4): pt to demonstrate gait training with RW mod A for 15' PT Short Term Goal 2 - Progress (Week 4): Not met Week 5:  PT Short Term Goal 1 (Week 5): pt to demonstrate to [facility] mobility at 61' CGA PT Short Term Goal 2 (Week 5):  pt to demonstrate min A functional transfers consistently PT Short Term Goal 3 (Week 5): pt to demonstrate supervision bed mobility consistently  Skilled Therapeutic Interventions/Progress Updates:    pt received in bed and agreeable to therapy however on bed pan reported he has had diarrhea for 3 days. Pt was mod I for rolling with bedrails, max A for pericare; max A for donning clean brief, min A for pants. Pt then supervision for supine>sit and agreeable to attempt therapeutic exercises x10 marching however pt then reported 10/10 pain at buttocks and required to return to supine at supervision and supervision for repositioning. Pt then  directed in x10 bridging. However at end of session, nursing present for stool sample. Pt left in bed, with nursing present, All needs in reach and in good condition. Call light in hand.  Pt missed 20 minutes of PT session 2/2 nursing care.  Therapy Documentation Precautions:  Precautions Precautions: Fall Precaution Comments: wounds on bilat feet and L sacrum, inconsistent orthostatic BPs Required Braces or Orthoses: Other Brace Other Brace: abdominal binder PRN with standing/strenuous activity Restrictions Weight Bearing Restrictions: No General:   Vital Signs: Therapy Vitals Temp: 97.6 F (36.4 C) Pulse Rate: 100 Resp: 17 BP: (!) 158/79 Patient Position (if appropriate): Lying Oxygen Therapy SpO2: 99 % O2 Device: Room Air Pain: Pain Assessment Pain Scale: 0-10 Pain Score: 0-No pain Vision/Perception     Mobility:   Locomotion :    Trunk/Postural Assessment :    Balance:   Exercises:   Other Treatments:     Therapy/Group: Individual Therapy  [name] 07/05/2020, 3:23 PM

## 2020-07-05 NOTE — Progress Notes (Signed)
Occupational Therapy Weekly Progress Note  [name] MRN: [id number] Date of Birth: [birth year]  Beginning of progress report period: 06/25/2020 End of progress report period: 07/05/2020  No STGs set due to ELOS  Pt has made slow progress at time of report. During this report period pt has experienced more incontinence due to IV antibiotics which has limited OT progress and overall participation. Pt can complete toileting tasks with Min A bedlevel as well as LB dressing bedlevel. He exhibits the ability to complete sit<stands with Max A using RW however this is inconsistent. Pt is currently awaiting SNF placement as family cannot provide his current care needs. Continue OT POC.    Patient continues to demonstrate the following deficits: muscle weakness, decreased cardiorespiratoy endurance and decreased standing balance and therefore will continue to benefit from skilled OT intervention to enhance overall performance with BADL and iADL.  Patient progressing toward long term goals..  Continue plan of care.  OT Short Term Goals Week 3:  OT Short Term Goal 1 (Week 3): STGs=LTGs due to ELOS OT Short Term Goal 1 - Progress (Week 3): Progressing toward goal Week 4:  OT Short Term Goal 1 (Week 4): STGs=LTGs due to ELOS     Therapy Documentation Precautions:  Precautions Precautions: Fall Precaution Comments: wounds on bilat feet and L sacrum, inconsistent orthostatic BPs Required Braces or Orthoses: Other Brace Other Brace: abdominal binder PRN with standing/strenuous activity Restrictions Weight Bearing Restrictions: No Vital Signs: Therapy Vitals Temp: 98.3 F (36.8 C) Pulse Rate: 97 Resp: 20 BP: (!) 152/87 Patient Position (if appropriate): Lying Oxygen Therapy SpO2: 100 % O2 Device: Room Air ADL: ADL Eating: Set up (per staff report) Where Assessed-Eating: Bed level Grooming: Modified independent Where Assessed-Grooming: Sitting at  sink,Wheelchair Upper Body Bathing: Setup Where Assessed-Upper Body Bathing: Edge of bed Lower Body Bathing: Minimal assistance Where Assessed-Lower Body Bathing: Bed level Upper Body Dressing: Setup Where Assessed-Upper Body Dressing: Sitting at sink,Wheelchair Lower Body Dressing: Setup Where Assessed-Lower Body Dressing: Bed level Toileting: Maximal assistance Where Assessed-Toileting: Bed level Toilet Transfer: Not assessed (not assessed d/t safety) :     Therapy/Group: Individual Therapy  Winnell Bento A Katyana Trolinger 07/05/2020, 7:06 AM

## 2020-07-05 NOTE — Progress Notes (Signed)
  [facility] Progress Note   Assessment/ Plan:   1.  History of prolonged hospitalization with COVID-19 and consequent debilitation/sacrococcygeal decubitus/osteomyelitis: Status post completion of intravenous antibiotics and ongoing inpatient rehabilitation. 2. ESRD: Continue hemodialysis on a TTS schedule with next hemodialysis due tomorrow; he does not have any acute dialysis indications at this time. 3. Anemia: Without overt blood loss, continue monitoring hemoglobin/hematocrit trend-on high-dose ESA. 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 5. Nutrition: Continue renal diet with ongoing fluid restriction and nutritional supplements/renal MVI. 6. Hypertension: Blood pressure within acceptable range with intermittent elevations noted-monitor with hemodialysis and current treatment with amlodipine/hydralazine.  Subjective:   Reports to be doing well with physical therapy and denies any acute complaints   Objective:   BP (!) 152/87 (BP Location: Left Arm)   Pulse 97   Temp 98.3 F (36.8 C)   Resp 20   Ht '6\' 6"'$  (1.981 m)   Wt 119 kg   SpO2 100%   BMI 30.32 kg/m   Physical Exam: Gen: Doing weighted presses in bed with PT CVS: Pulse regular rhythm, normal rate, S1 and S2 normal Resp: Anteriorly clear to auscultation, no distinct rales or rhonchi.  Right IJ TDC in place Abd: Soft, obese, nontender, bowel sounds normal Ext: Trace ankle edema bilaterally  Labs: BMET Recent Labs  Lab 06/29/20 1645 07/01/20 1229 07/03/20 1654 07/05/20 0555  NA 133* 134* 135 135  K 4.8 4.5 4.9 4.3  CL 96* 98 98 99  CO2 '25 25 25 26  '$ GLUCOSE 119* 110* 139* 113*  BUN 63* 47* 51* 47*  CREATININE 7.51* 6.79* 6.14* 5.46*  CALCIUM 9.9 9.0 9.0 9.1  PHOS 4.7* 4.2 4.0 3.9   CBC Recent Labs  Lab 06/30/20 0513 07/01/20 1229 07/03/20 1654 07/05/20 0555  WBC 7.5 10.5 10.4 10.2  NEUTROABS 4.8  --   --  6.5  HGB 9.8* 9.8* 9.9* 10.0*  HCT 30.9* 32.4* 33.0*  33.3*  MCV 83.5 83.9 84.6 83.9  PLT 272 311 330 300      Medications:    . (feeding supplement) PROSource Plus  30 mL Oral TID BM  . amLODipine  5 mg Oral QHS  . Chlorhexidine Gluconate Cloth  6 each Topical BID  . Chlorhexidine Gluconate Cloth  6 each Topical Q0600  . collagenase   Topical Daily  . darbepoetin (ARANESP) injection - DIALYSIS  200 mcg Intravenous Q Tue-HD  . famotidine  20 mg Oral QHS  . feeding supplement (NEPRO CARB STEADY)  237 mL Oral TID BM  . heparin injection (subcutaneous)  7,500 Units Subcutaneous Q8H  . insulin aspart  0-9 Units Subcutaneous TID WC  . insulin glargine  11 Units Subcutaneous QHS  . lanthanum  500 mg Oral TID WC  . multivitamin  1 tablet Oral QHS  . pneumococcal 23 valent vaccine  0.5 mL Intramuscular Tomorrow-1000    Elmarie Shiley, MD 07/05/2020, 10:40 AM

## 2020-07-06 DIAGNOSIS — A0472 Enterocolitis due to Clostridium difficile, not specified as recurrent: Secondary | ICD-10-CM

## 2020-07-06 DIAGNOSIS — R195 Other fecal abnormalities: Secondary | ICD-10-CM

## 2020-07-06 LAB — GLUCOSE, CAPILLARY
Glucose-Capillary: 98 mg/dL (ref 70–99)
Glucose-Capillary: 88 mg/dL (ref 70–99)
Glucose-Capillary: 84 mg/dL (ref 70–99)
Glucose-Capillary: 82 mg/dL (ref 70–99)

## 2020-07-06 LAB — CBC WITH DIFFERENTIAL/PLATELET
Abs Immature Granulocytes: 0.09 10*3/uL — ABNORMAL HIGH (ref 0.00–0.07)
Basophils Absolute: 0.1 10*3/uL (ref 0.0–0.1)
Basophils Relative: 1 %
Eosinophils Absolute: 0.4 10*3/uL (ref 0.0–0.5)
Eosinophils Relative: 4 %
HCT: 32.9 % — ABNORMAL LOW (ref 39.0–52.0)
Hemoglobin: 10.4 g/dL — ABNORMAL LOW (ref 13.0–17.0)
Immature Granulocytes: 1 %
Lymphocytes Relative: 20 %
Lymphs Abs: 2.1 10*3/uL (ref 0.7–4.0)
MCH: 26.3 pg (ref 26.0–34.0)
MCHC: 31.6 g/dL (ref 30.0–36.0)
MCV: 83.1 fL (ref 80.0–100.0)
Monocytes Absolute: 1.1 10*3/uL — ABNORMAL HIGH (ref 0.1–1.0)
Monocytes Relative: 10 %
Neutro Abs: 6.9 10*3/uL (ref 1.7–7.7)
Neutrophils Relative %: 64 %
Platelets: 343 10*3/uL (ref 150–400)
RBC: 3.96 MIL/uL — ABNORMAL LOW (ref 4.22–5.81)
RDW: 16.2 % — ABNORMAL HIGH (ref 11.5–15.5)
WBC: 10.7 10*3/uL — ABNORMAL HIGH (ref 4.0–10.5)
nRBC: 0 % (ref 0.0–0.2)

## 2020-07-06 LAB — RENAL FUNCTION PANEL
Albumin: 2 g/dL — ABNORMAL LOW (ref 3.5–5.0)
Anion gap: 10 (ref 5–15)
BUN: 60 mg/dL — ABNORMAL HIGH (ref 6–20)
CO2: 26 mmol/L (ref 22–32)
Calcium: 9.2 mg/dL (ref 8.9–10.3)
Chloride: 99 mmol/L (ref 98–111)
Creatinine, Ser: 6.72 mg/dL — ABNORMAL HIGH (ref 0.61–1.24)
GFR, Estimated: 9 mL/min — ABNORMAL LOW (ref >60)
Glucose, Bld: 96 mg/dL (ref 70–99)
Phosphorus: 4.7 mg/dL — ABNORMAL HIGH (ref 2.5–4.6)
Potassium: 5.1 mmol/L (ref 3.5–5.1)
Sodium: 135 mmol/L (ref 135–145)

## 2020-07-06 LAB — C DIFFICILE (CDIFF) QUICK SCRN (NO PCR REFLEX)
C Diff antigen: POSITIVE — AB
C Diff interpretation: DETECTED
C Diff toxin: POSITIVE — AB

## 2020-07-06 LAB — HEPATITIS B SURFACE ANTIGEN: Hepatitis B Surface Ag: NONREACTIVE

## 2020-07-06 LAB — HEMOGLOBIN AND HEMATOCRIT, BLOOD
HCT: 33.5 % — ABNORMAL LOW (ref 39.0–52.0)
HCT: 33.3 % — ABNORMAL LOW (ref 39.0–52.0)
Hemoglobin: 10.3 g/dL — ABNORMAL LOW (ref 13.0–17.0)
Hemoglobin: 10.1 g/dL — ABNORMAL LOW (ref 13.0–17.0)

## 2020-07-06 MED ORDER — ALTEPLASE 2 MG IJ SOLR: 4.0000 mg | Freq: Once | INTRAMUSCULAR | Status: AC

## 2020-07-06 MED ORDER — VANCOMYCIN 50 MG/ML ORAL SOLUTION
125.0000 mg | Freq: Four times a day (QID) | ORAL | Status: DC
  Administered 2020-07-06 – 2020-07-13 (×29): 125 mg via ORAL
  Filled 2020-07-06 (×30): qty 2.5

## 2020-07-06 MED ORDER — DARBEPOETIN ALFA 200 MCG/0.4ML IJ SOSY
PREFILLED_SYRINGE | INTRAMUSCULAR | Status: AC
  Administered 2020-07-06: 200 ug via INTRAVENOUS
  Filled 2020-07-06: qty 0.4

## 2020-07-06 MED ORDER — HEPARIN SODIUM (PORCINE) 1000 UNIT/ML DIALYSIS
40.0000 [IU]/kg | INTRAMUSCULAR | Status: DC | PRN
  Filled 2020-07-06: qty 5

## 2020-07-06 MED ORDER — ALTEPLASE 2 MG IJ SOLR
INTRAMUSCULAR | Status: AC
  Administered 2020-07-06: 4 mg
  Filled 2020-07-06: qty 4

## 2020-07-06 MED ORDER — PANTOPRAZOLE SODIUM 40 MG PO TBEC
40.0000 mg | DELAYED_RELEASE_TABLET | Freq: Every day | ORAL | Status: DC
  Administered 2020-07-07 – 2020-07-16 (×9): 40 mg via ORAL
  Filled 2020-07-06 (×10): qty 1

## 2020-07-06 NOTE — Progress Notes (Signed)
Patient ID: Fred [name], male   DOB: 12/07/1970, 50 y.o.   MRN: [id number] Met with pt and spoke with sister-melissa via telephone to update regarding team conference. Aware he has C-diff again and informed GI consult being done today. Have not heard back from [facility] but will call to inform of new diagnosis. Also will look at [facility] in [place] can get a bed offer from either place.

## 2020-07-06 NOTE — Progress Notes (Signed)
Physical Therapy Session Note  [name] MRN: [id number] Date of Birth: 1971-01-23  Today's Date: 07/06/2020 PT Individual Time: 0800  Missed 60 mins PT  Skilled Therapeutic Interventions/Progress Updates:    pt received in bed sleeping, aroused to name. Pt reported in he has had increased nausea and abdominal pain with diarrhea and overall feeling sick. Pt does have new (+) C diff.  Pt denied to participate with PT x2 with PT attempting to see pt at 800 am and 0833 am. Pt missed 60 mins of PT. Pt left as found in bed, All needs in reach and in good condition. Call light in hand.  Nursing made aware.       [name] 07/06/2020, 9:23 AM

## 2020-07-06 NOTE — Progress Notes (Signed)
Nutrition Follow-up  DOCUMENTATION CODES:   Morbid obesity  INTERVENTION:   -Continue renal MVI daily  - Continue Nepro Shake po TID, each supplement provides 425 kcal and 19 grams protein  -Continue double protein portions at each meal  - Continue Greek yogurt TID with meals  -d/c ProSource as pt is refusing  - Encourage adequate PO intake  NUTRITION DIAGNOSIS:   Increased nutrient needs related to wound healing as evidenced by estimated needs.  Ongoing  GOAL:   Patient will meet greater than or equal to 90% of their needs  Progressing  MONITOR:   PO intake,Supplement acceptance,Labs,Weight trends,Skin,I & O's  REASON FOR ASSESSMENT:   Malnutrition Screening Tool    ASSESSMENT:   50 year old right-handed male with PMH of HTN, DM, ESRD on HD. Pt admitted with critical illness/mypoathy/debilitation secondary to COVID-19.  Discharge currently on hold.  Last HD was on 1/12 with 500 ml net UF. No post-HD weight documented.  Per notes, pt with FOBT positive stool. Pt also tested positive for C difficile this morning. Per GI note, previous MDs at outside hospital have suspected diabetic gastroparesis.  Spoke with pt at bedside. Pt frustrated regarding testing positive for C difficile. Pt states that he continues to have nausea which continues to impact his PO intake. Pt is drinking the Nepro shakes about twice a day. He does not like the ProSource. RD will d/c these.  RD discussed with pt the importance of continuing PO intake to promote wound healing and prevent additional dry weight loss. Pt states that he will try.  Meal Completion: 100% x last 8 documented meals  Medications reviewed and include: ProSource Plus TID, aranesp, pepcid, Nepro TID,  SSI, lantus 11 units daily, fosrenol TID, rena-vit, IV venofer 100 mg q T-Th-Sa  Labs reviewed. CBG's: 98-171 x 24 hours  UOP: 1175 ml + 1 unmeasured occurrence x 24 hours  Diet Order:   Diet Order             Diet Carb Modified Fluid consistency: Thin; Room service appropriate? Yes  Diet effective now                 EDUCATION NEEDS:   No education needs have been identified at this time  Skin:  Skin Assessment: Skin Integrity Issues: Stage IV: left buttock Unstageable: left ankle, left foot, right foot, right heel  Last BM:  07/06/20 medium type 6  Height:   Ht Readings from Last 1 Encounters:  06/06/20 '6\' 6"'$  (1.981 m)    Weight:   Wt Readings from Last 1 Encounters:  06/05/20 (!) 161.9 kg    Ideal Body Weight:  97.3 kg  BMI:  Body mass index is 30.32 kg/m.  Estimated Nutritional Needs:   Kcal:  [id number]  Protein:  165-195 grams  Fluid:  1.2 L    Gustavus Bryant, MS, RD, LDN Inpatient Clinical Dietitian Please see AMiON for contact information.

## 2020-07-06 NOTE — Progress Notes (Signed)
Occupational Therapy Session Note  [name] MRN: [id number] Date of Birth: 1971-02-25  Today's Date: 07/06/2020 OT Individual Time: 1040-1050 OT Individual Time Calculation (min): 10 min  and Today's Date: 07/06/2020 OT Missed Time: 50 Minutes Missed Time Reason: Patient ill (comment)   Short Term Goals: Week 2:  OT Short Term Goal 1 (Week 2): Pt will tolerate sitting OOB for 2-3 hours OT Short Term Goal 1 - Progress (Week 2): Progressing toward goal OT Short Term Goal 2 (Week 2): Pt will complete LB bathing and dressing with Min A w/ AE PRN OT Short Term Goal 2 - Progress (Week 2): Met OT Short Term Goal 3 (Week 2): Pt will complete transfer to [facility] with LRAD with Min A OT Short Term Goal 3 - Progress (Week 2): Progressing toward goal OT Short Term Goal 4 (Week 2): Pt will perform sit to stand at [facility] with Max A of 1 consistently OT Short Term Goal 4 - Progress (Week 2): Met Week 3:  OT Short Term Goal 1 (Week 3): STGs=LTGs due to ELOS OT Short Term Goal 1 - Progress (Week 3): Progressing toward goal Week 4:  OT Short Term Goal 1 (Week 4): STGs=LTGs due to ELOS  Skilled Therapeutic Interventions/Progress Updates:    Pt greeted at time of session supine in bed feeling very ill, knows he does have C. Diff, and saying that he "wants to try" to participate in therapy but when asked to perform bed level bathing/dressing, oral hygiene, sitting EOB, or any movement pt stating he could not do it because he felt so bad. Pt did wash face with set up, also provided emesis bags and fresh drink as pt needed more. Again pt stating he wanted to try to participate but unable to physically do so because of fatigue and feeling ill. Missed 50 mins OT.   Therapy Documentation Precautions:  Precautions Precautions: Fall Precaution Comments: wounds on bilat feet and L sacrum, inconsistent orthostatic BPs Required Braces or Orthoses: Other Brace Other Brace: abdominal binder PRN  with standing/strenuous activity Restrictions Weight Bearing Restrictions: No     Therapy/Group: Individual Therapy  [name] 07/06/2020, 11:00 AM

## 2020-07-06 NOTE — NC FL2 (Signed)
[place] LEVEL OF CARE SCREENING TOOL     IDENTIFICATION  Patient Name: Fred [name] Birthdate: 1970/05/26 Sex: male Admission Date (Current Location): 06/06/2020  [place] and [place] Number:  Kathleen Argue [id number] [place] and Address:  The [place], [place] [address], [place], [place] [address]      Provider Number: [id number]  Attending Physician Name and Address:  Courtney Heys, MD  Relative Name and Phone Number:  Berton Lan [phone]    Current Level of Care: Other (Comment) (Rehab) Recommended Level of Care: [place] Prior Approval Number:    Date Approved/Denied:   PASRR Number: [id number]  Discharge Plan: SNF    Current Diagnoses: Patient Active Problem List   Diagnosis Date Noted  . Diarrhea   . History of Clostridioides difficile colitis   . Fever   . Anemia of chronic disease   . ESRD on dialysis ([place])   . Diabetic peripheral neuropathy ([place])   . Labile blood glucose   . Essential hypertension   . Constipation   . 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 Avascular necrosis of first metatarsal, left foot ([place]) 04/30/2020  . Osteomyelitis of dorsal first metatarsal, left foot ([place]) 04/30/2020  . Pressure injury of sacral region, unstageable ([place]) 04/30/2020  . Morbid obesity ([place]) 04/30/2020  . Multiple open wounds of foot   . Insulin dependent type 2 diabetes mellitus ([place]) 04/29/2020  . HTN (hypertension) 04/29/2020  . History of anemia due to chronic kidney disease 04/29/2020  . 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 Pressure injury of both heels, unstageable ([place]) 04/29/2020  . Pneumonia due to COVID-19 virus 04/29/2020  . ESRD (end stage renal disease) ([place]) 04/29/2020    Orientation RESPIRATION BLADDER Height & Weight     Self,Time,Situation,Place  Normal Continent Weight:  (bed scale not working, will zero when pt OOB;to report to dayshift) Height:  '6\' 6"'$  (198.1 cm)   BEHAVIORAL SYMPTOMS/MOOD NEUROLOGICAL BOWEL NUTRITION STATUS      Continent,Incontinent Diet (Mod carb diet)  AMBULATORY STATUS COMMUNICATION OF NEEDS Skin   Extensive Assist Verbally Other (Comment) (Wounds feet and sacrum)                       Personal Care Assistance Level of Assistance  Bathing,Dressing,Feeding Bathing Assistance: Limited assistance Feeding assistance: Limited assistance Dressing Assistance: Limited assistance     Functional Limitations Info             SPECIAL CARE FACTORS FREQUENCY  PT (By licensed PT),OT (By licensed OT),Bowel and bladder program Blood Pressure Frequency: BP issues wearng abdominal binder when up right and when in therapies   PT Frequency: 5x week OT Frequency: 5x week Bowel and Bladder Program Frequency: Bowel program does not feel when needs to have a bowel movement          Contractures Contractures Info: Not present    Additional Factors Info  Code Status,Allergies Code Status Info: Full Code Allergies Info: Atorvastatin           Current Medications (07/06/2020):  This is the current hospital active medication list Current Facility-Administered Medications  Medication Dose Route Frequency Provider Last Rate Last Admin  . acetaminophen (TYLENOL) tablet 650 mg  650 mg Oral Q6H PRN Cathlyn Parsons, PA-C   650 mg at 07/06/20 [id number]  . amLODipine (NORVASC) tablet 5 mg  5 mg Oral QHS Collins, Samantha G, PA-C   5 mg at 07/05/20 1710  . bisacodyl (DULCOLAX)  suppository 10 mg  10 mg Rectal Daily PRN Lovorn, Jinny Blossom, MD   10 mg at 06/13/20 1541  . Chlorhexidine Gluconate Cloth 2 % PADS 6 each  6 each Topical BID Courtney Heys, MD   6 each at 07/06/20 0601  . Chlorhexidine Gluconate Cloth 2 % PADS 6 each  6 each Topical Q0600 Janalee Dane, PA-C   6 each at 07/06/20 0600  . collagenase (SANTYL) ointment   Topical Daily Courtney Heys, MD   Given at 07/06/20 [phone]  . Darbepoetin Alfa (ARANESP) injection 200 mcg  200 mcg  Intravenous Q Tue-HD Lovorn, Megan, MD   200 mcg at 06/29/20 1731  . feeding supplement (NEPRO CARB STEADY) liquid 237 mL  237 mL Oral TID BM Lovorn, Megan, MD 237 mL/hr at 07/04/20 1456 237 mL at 07/05/20 0903  . heparin injection 7,500 Units  7,500 Units Subcutaneous Q8H Cathlyn Parsons, PA-C   7,500 Units at 07/06/20 0554  . hydrALAZINE (APRESOLINE) tablet 25 mg  25 mg Oral Q8H PRN Lovorn, Megan, MD      . insulin aspart (novoLOG) injection 0-9 Units  0-9 Units Subcutaneous TID WC Angiulli, Lavon Paganini, PA-C   2 Units at 07/05/20 1711  . insulin glargine (LANTUS) injection 11 Units  11 Units Subcutaneous QHS Lovorn, Jinny Blossom, MD   11 Units at 07/05/20 2201  . iron sucrose (VENOFER) 100 mg in sodium chloride 0.9 % 100 mL IVPB  100 mg Intravenous Q T,Th,Sa-HD Collins, Hervey Ard, PA-C   Stopped at 07/03/20 1835  . lanthanum (FOSRENOL) chewable tablet 500 mg  500 mg Oral TID WC Penninger, Lindsay, PA   500 mg at 07/05/20 1247  . loperamide (IMODIUM) capsule 2 mg  2 mg Oral PRN Lovorn, Jinny Blossom, MD   2 mg at 07/06/20 0754  . multivitamin (RENA-VIT) tablet 1 tablet  1 tablet Oral QHS Cathlyn Parsons, PA-C   1 tablet at 07/05/20 2153  . ondansetron (ZOFRAN) tablet 4 mg  4 mg Oral Q6H PRN Cathlyn Parsons, PA-C   4 mg at 07/05/20 1718   Or  . ondansetron (ZOFRAN) injection 4 mg  4 mg Intravenous Q6H PRN AngiulliLavon Paganini, PA-C   4 mg at 06/30/20 2140  . [START ON 07/07/2020] pantoprazole (PROTONIX) EC tablet 40 mg  40 mg Oral Q0600 Vena Rua, PA-C      . pneumococcal 23 valent vaccine (PNEUMOVAX-23) injection 0.5 mL  0.5 mL Intramuscular Tomorrow-1000 Lovorn, Megan, MD      . prochlorperazine (COMPAZINE) tablet 10 mg  10 mg Oral Q6H PRN Lovorn, Megan, MD   10 mg at 06/11/20 0516  . vancomycin (VANCOCIN) 50 mg/mL oral solution 125 mg  125 mg Oral QID Lovorn, Jinny Blossom, MD   125 mg at 07/06/20 1130     Discharge Medications: Please see discharge summary for a list of discharge  medications.  Relevant Imaging Results:  Relevant Lab Results:   Additional Information SSN: [id number] Wound care needs 1) Feet wash both feet with soap and water, rinse and pat dry. Apply betadine to all unstageable areas on feet by paintiong on throughly and allowing to air dry. Apply daily. Place feet in prevalone boots. 2). Sacrum-insert saline moistened gauze into sacral wound. Cover with ABD pads, tape in place. Not COVID vaccinated. C-diff and VRE Enteric precautions  Canna Nickelson, Gardiner Rhyme, LCSW

## 2020-07-06 NOTE — Progress Notes (Signed)
PHYSICAL MEDICINE & REHABILITATION PROGRESS NOTE   Subjective/Complaints:  Got Result from C diff- is (+)-  Having frequent stools-  C/o abd pain and cramping still- wants it "treated"- explained cannot use Imodium with C diff Also has incontinence of bowel, which is making it worse.     ROS:   Pt denies SOB, abd pain, CP, N/V/C/D, and vision changes   Objective:   No results found. Recent Labs    07/05/20 0555 07/05/20 1422 07/05/20 1658 07/06/20 0437  WBC 10.2 11.1*  --   --   HGB 10.0* 10.2* 10.0* 10.3*  HCT 33.3* 33.7* 31.8* 33.5*  PLT 300 336  --   --    Recent Labs    07/03/20 1654 07/05/20 0555  NA 135 135  K 4.9 4.3  CL 98 99  CO2 25 26  GLUCOSE 139* 113*  BUN 51* 47*  CREATININE 6.14* 5.46*  CALCIUM 9.0 9.1    Intake/Output Summary (Last 24 hours) at 07/06/2020 0858 Last data filed at 07/06/2020 4627 Gross per 24 hour  Intake 3188 ml  Output 1175 ml  Net 2013 ml     Pressure Injury 05/01/20 Buttocks Left Stage 4 - Full thickness tissue loss with exposed bone, tendon or muscle. packed with santyl gauze, dressed by WOCN, (Active)  05/01/20 0230  Location: Buttocks  Location Orientation: Left  Staging: Stage 4 - Full thickness tissue loss with exposed bone, tendon or muscle.  Wound Description (Comments): packed with santyl gauze, dressed by WOCN,  Present on Admission: Yes     Pressure Injury 05/01/20 Ankle Left;Lateral Unstageable - Full thickness tissue loss in which the base of the injury is covered by slough (yellow, tan, gray, green or brown) and/or eschar (tan, brown or black) in the wound bed. smal circular wound on the (Active)  05/01/20 0230  Location: Ankle  Location Orientation: Left;Lateral  Staging: Unstageable - Full thickness tissue loss in which the base of the injury is covered by slough (yellow, tan, gray, green or brown) and/or eschar (tan, brown or black) in the wound bed.  Wound Description (Comments): smal  circular wound on the bone  Present on Admission: Yes     Pressure Injury 05/01/20 Foot Left;Lateral Unstageable - Full thickness tissue loss in which the base of the injury is covered by slough (yellow, tan, gray, green or brown) and/or eschar (tan, brown or black) in the wound bed. 4th and 5th toes appear nec (Active)  05/01/20 0230  Location: Foot (4th and 5th toes and lateral/anterior side of the sole)  Location Orientation: Left;Lateral  Staging: Unstageable - Full thickness tissue loss in which the base of the injury is covered by slough (yellow, tan, gray, green or brown) and/or eschar (tan, brown or black) in the wound bed.  Wound Description (Comments): 4th and 5th toes appear necrotic circular wound on sole  Present on Admission: Yes     Pressure Injury 05/01/20 Foot Right;Medial;Posterior Unstageable - Full thickness tissue loss in which the base of the injury is covered by slough (yellow, tan, gray, green or brown) and/or eschar (tan, brown or black) in the wound bed. large circular wo (Active)  05/01/20 0230  Location: Foot  Location Orientation: Right;Medial;Posterior  Staging: Unstageable - Full thickness tissue loss in which the base of the injury is covered by slough (yellow, tan, gray, green or brown) and/or eschar (tan, brown or black) in the wound bed.  Wound Description (Comments): large circular wound on sole  and heel  Present on Admission: Yes     Pressure Injury 05/08/20 Heel Right Unstageable - Full thickness tissue loss in which the base of the injury is covered by slough (yellow, tan, gray, green or brown) and/or eschar (tan, brown or black) in the wound bed. (Active)  05/08/20 1718  Location: Heel  Location Orientation: Right  Staging: Unstageable - Full thickness tissue loss in which the base of the injury is covered by slough (yellow, tan, gray, green or brown) and/or eschar (tan, brown or black) in the wound bed.  Wound Description (Comments):   Present on  Admission: Yes    Physical Exam: Vital Signs Blood pressure (!) 152/93, pulse 100, temperature 98.6 F (37 C), temperature source Oral, resp. rate 18, height 6' 6" (1.981 m), weight 119 kg, SpO2 100 %. Constitutional: laying supine in bed; a little grumpy, sleepy, NAD HEENT: EOMI, oral membranes moist Neck: supple Cardiovascular: RRR- no JVD Respiratory/Chest: CTA B/L- no W/R/R- good air movement GI/Abdomen:  Soft, but diffusely TTP- no rebound, maybe slightly distended- hyperactive BS Ext: no clubbing, cyanosis, or edema Psych: sleepy- upset about C diff Extremities: edema in extremities.  No tenderness in extremities. Neuro: Alert Motor: Bilateral upper extremities: 5/5 proximal distal Bilateral lower extremities: 4-/5 proximal distal   Skin: Multiple foot wounds. L foot- has ruptured blood blister 5th MTP base; also 4th toe is dry gangrene- all black eschar. Large sacral wound  Assessment/Plan: 1. Functional deficits which require 3+ hours per day of interdisciplinary therapy in a comprehensive inpatient rehab setting.  Physiatrist is providing close team supervision and 24 hour management of active medical problems listed below.  Physiatrist and rehab team continue to assess barriers to discharge/monitor patient progress toward functional and medical goals  Care Tool:  Bathing    Body parts bathed by patient: Right arm,Left arm,Chest,Abdomen,Right upper leg,Left upper leg,Face,Right lower leg,Left lower leg   Body parts bathed by helper: Front perineal area,Buttocks     Bathing assist Assist Level: Minimal Assistance - Patient > 75%     Upper Body Dressing/Undressing Upper body dressing   What is the patient wearing?: Pull over shirt    Upper body assist Assist Level: Set up assist    Lower Body Dressing/Undressing Lower body dressing      What is the patient wearing?: Pants     Lower body assist Assist for lower body dressing: Minimal Assistance - Patient >  75%     Toileting Toileting    Toileting assist Assist for toileting: Minimal Assistance - Patient > 75% Assistive Device Comment:  (urinal)   Transfers Chair/bed transfer  Transfers assist  Chair/bed transfer activity did not occur: Safety/medical concerns (Per nursing report)  Chair/bed transfer assist level: Minimal Assistance - Patient > 75% Chair/bed transfer assistive device: Sliding board,Armrests   Locomotion Ambulation   Ambulation assist   Ambulation activity did not occur: Safety/medical concerns (fatigue, lightheadedness, generalized weakness)  Assist level: Minimal Assistance - Patient > 75% Assistive device: Parallel bars Max distance: 24 ft   Walk 10 feet activity   Assist  Walk 10 feet activity did not occur: Safety/medical concerns (fatigue, lightheadedness, generalized weakness)  Assist level: Minimal Assistance - Patient > 75% Assistive device: Parallel bars   Walk 50 feet activity   Assist Walk 50 feet with 2 turns activity did not occur: Safety/medical concerns (fatigue, lightheadedness, generalized weakness)         Walk 150 feet activity   Assist Walk 150 feet activity did  not occur: Safety/medical concerns (fatigue, lightheadedness, generalized weakness)         Walk 10 feet on uneven surface  activity   Assist Walk 10 feet on uneven surfaces activity did not occur: Safety/medical concerns (fatigue, lightheadedness, generalized weakness)         Wheelchair     Assist Will patient use wheelchair at discharge?: Yes Type of Wheelchair: Manual    Wheelchair assist level: Supervision/Verbal cueing Max wheelchair distance: 100    Wheelchair 50 feet with 2 turns activity    Assist        Assist Level: Supervision/Verbal cueing   Wheelchair 150 feet activity     Assist      Assist Level: Minimal Assistance - Patient > 75%   Blood pressure (!) 152/93, pulse 100, temperature 98.6 F (37 C), temperature  source Oral, resp. rate 18, height 6' 6" (1.981 m), weight 119 kg, SpO2 100 %.  Medical Problem List and Plan: 1.  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  Antithrombotics: -DVT/anticoagulation: Subcutaneous heparin.               -antiplatelet therapy: N/A 3. Pain Management: Tylenol as needed  2/11- denied pain- con't regimen 4. Mood: Provide emotional support             -antipsychotic agents: N/A 5. Neuropsych: This patient is? capable of making decisions on his own behalf. 6. Skin/Wound Care: Routine skin checks 7. Fluids/Electrolytes/Nutrition: Routine in and outs with follow-up chemistries 8.  ESRD.  Hemodialysis per renal services 9.  Sacral decubitus ulcer as well as ischemic ulcers on the left foot fourth toe and fifth metatarsal head and right heel and fifth metatarsal head.  Wound care nurse follow-up.  Patient to see Dr. Sharol Given outpatient.  Continue bilateral PRAFO's. 10.  ID/ coccygeal osteomyelitis.    Completed vancomycin and cefepime on 06/10/2020  1/18- will schedule turning q2 hours to get off wounds- also will make sure on specialty bed  1/20- is on specialty bed  Repeat labs ordered for Monday  2/7- CRP 6.5- ESR pending - con't regimen  2/8- ESR  DOWN TO 67 FROM 73- SO HEADING IN RIGHT DIRECTION  2/9- Spiked temp last night of 100.9- restarted Vanc/Cefipime- pt refused Vanc "since causes diarrhea". WBC now normal again this AM- will con't for now. CXR (-); is anuric-  2/10- having fevers/sweats/chills- Tm 99.4- needs to also take Vanc- told this to pt- also, called ID and also ordered another CT of pelvis due to Sx's-    2/13 ID saw pt Friday. Thinks that low grade temps are coming from HD cath. Was not impressed by MRI of pelvis. Recommended stopping abx (d/t loose stool)   -continue to monitor   -continue [facility] per nephrology   -afebrile last 36 hours  2/14- No fevers even low grafde x 48+ hours- con't off ABX.  2/15-  Won't turn- always on backside in bed.   10.  Anemia of chronic disease.  Continue Aranesp.   Hemoglobin 10.0 on 2/3, labs with HD 11.  Diabetes mellitus with peripheral neuropathy .  Hemoglobin A1c 5.6.  Lantus insulin 11 units nightly  CBG (last 3)  Recent Labs    07/05/20 1634 07/05/20 2139 07/06/20 0555  GLUCAP 171* 102* 98   2/12- cbg's under reasonable contro  2/15- BGs reasonable- 1 in 170s, but otherwise good control 12.  Morbid obesity.  BMI 38.37.  Dietary follow-up 13. Dizziness/nausea with HTN and  hypotension  Vitals:   07/05/20 2143 07/06/20 0607  BP: (!) 164/99 (!) 152/93  Pulse: 100 100  Resp: 18 18  Temp: 98.4 F (36.9 C) 98.6 F (37 C)  SpO2: 100% 100%    Hydralazine prn   Norvasc to 7.5 mg daily   2/9- BP 156/85 this AM- but has been controlled last few days  2/12-13-bps still with some fluctuation but not severe. Continue current meds, volume mgt per nephrology  2/15- BP's 150s-160s/90s- on Norvasc 7.5- can increase, but usually has orthostatic hypotension when we do. Will d/w Renal 14. Diarrhea with new constipation  Bowel meds increased on 2/5  2/7- will decrease bowel meds again since 8+ BMs in last 24 hours- con't regimen  2/12 stool still mushy. Stopped miralax  2/14- will allow them to use the Imodium prn that's ordered- since still having so many stools- he does this from IV ABX 15. Leukocytosis: Resolved  ESR/CRP and CBC with diff ordered for Monday.  2/7- CRP 6.5- stable- con't regimen   2/8- EST 67- down to 73- con't regimen  2/11- Labs looking slightly better, but imaging does not- waiting to hear from ID  2/14- per ID, pelvis osteomyelitis not the issue- off ABX per their request 16. N/Vomiting-  2/10- likely due to underlying illness- will do ID w/u as above.  17. 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  16. Dispo  2/8- pt  unhappy that has to back to SNF- however has been participating intermittently- finally doing more/more interactive, but we can only keep max of 30 days. Sent FL2  2/9- pt refused PT yesterday -only bedlevel Exercises- pt needs max encouragement to progress with therapy   2/10- might have to hold d/c- we will see.  2/11- might not be able to d/c Monday per SNF search/SW   2/15- told SW that cannot d/c right now due to C Diff.    LOS: 30 days A FACE TO FACE EVALUATION WAS PERFORMED  Fred [name] 07/06/2020, 8:58 AM

## 2020-07-06 NOTE — Progress Notes (Signed)
Unable to run at ordered BFR 400 currently at BFR 250. Dr. Posey Pronto made aware orders to continue as tolerated and TPA catheter at completion

## 2020-07-06 NOTE — Patient Care Conference (Signed)
Inpatient RehabilitationTeam Conference and Plan of Care Update Date: 07/06/2020   Time: 11:39 AM    Patient Name: Fred [name]      Medical Record Number: [id number]  Date of Birth: August 09, 1970 Sex: Male         Room/Bed: 4M05C/4M05C-01 Payor Info: Payor: MEDICARE / Plan: MEDICARE PART A AND B / Product Type: *No Product type* /    Admit Date/Time:  06/06/2020  1:05 PM  Primary Diagnosis:  Critical illness myopathy  Hospital Problems: Principal Problem:   Critical illness myopathy Active Problems:   Pressure injury of sacral region, unstageable ([place])   Anemia of chronic disease   ESRD on dialysis ([place])   Diabetic peripheral neuropathy ([facility])   Labile blood glucose   Essential hypertension   Constipation   Fever   Diarrhea   History of Clostridioides difficile colitis    Expected Discharge Date: Expected Discharge Date:  (SNF)  Team Members Present: Physician leading conference: Dr. Courtney Heys Care Coodinator Present: Dorthula Nettles, RN, BSN, CRRN;Becky Dupree, LCSW Nurse Present: Rayne Du, LPN PT Present: Stacy Gardner, PT OT Present: Lillia Corporal, OT PPS Coordinator present : Ileana Ladd, Burna Mortimer, SLP     Current Status/Progress Goal Weekly Team Focus  Bowel/Bladder   Pt. is continent with bladder but incontinenet with bowel.  Pt. will maintain normal bowel and bladder pattern  Toilet Pt. every two hours on every shift and as needed.   Swallow/Nutrition/ Hydration             ADL's   (at last assessment) UB/LB dress set up bed level, LB bathe Min for buttocks, Supine > sit Supervision, slide board CGA/Min, has not been able to sit EOB or participate in OOB activity recently d/t loose bowel  Min sit to stands, Min LB bathe, CGA/S slide board transfers in prep for SNF  general conditioning, any OOB activity, core strength, sitting balance, functional transfers, ADL retraining   Mobility   SBA-CGA supine<>sit; CGA SB transfers; mod A-max transfers  with RW; has not been able to gait train since 2/10-11  mod I WC level, increased tolerance OOB in sitting upright; CGA standing and gait  inc tolerance to OOB and standing; strengthening; transfer training, gait training, WC mobility   Communication             Safety/Cognition/ Behavioral Observations            Pain   Pt. pain is well managed with current pain medications  Monitor pt. for pain on every shift and as needed  Notify MD if pain is not relief by current mdication.   Skin   Pt. 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 Foam dressing on bilateral heels.  Pt. will  maintain intact skin intergrity with no break down  Assess and monitor pt. skin every shift and as needed on every shift.     Discharge Planning:  Plan to go back to a NH for continued rehab then hopefully home with Dad. He wants to go to NH in [place] where father and sister live.   Team Discussion: C Diff (+), 2nd time. Started PO Vanc. Complains of abdominal pain. Incontinent of bowel. Stage IV to Coccyx, foam dressing to unstageable heels. Dressings changed every shift.  Patient on target to meet rehab goals: Last week was supervision with lower body bathing and dressing. Past several days has had a functional decline and not been able to participate  in much therapy. Since most recent C Diff diagnosis has not felt well enough to do therapy. Declined physical therapy today. Has not received any gait training since 07/01/2020 or slide board transfers.  *See Care Plan and progress notes for long and short-term goals.   Revisions to Treatment Plan:  C Diff diagnosis, started PO Vanc.  Teaching Needs: Family education, medication management, wound/skin care, gait training, slide board training, transfer training, endurance training, balance training.  Current Barriers to Discharge: Inaccessible home environment, Decreased caregiver support, Medical stability, Incontinence,  Neurogenic bowel and bladder, Wound care, Lack of/limited family support, Weight, Hemodialysis, Medication compliance and Behavior  Possible Resolutions to Barriers: Continue current medications, encourage patient to participate, provide patient and family with emotional support.     Medical Summary Current Status: C Diff (+)= started PO Vanc- abd pain; cramping; stage IV on Coccyx and feet- changed qshift; inconitnent Bowel; anuric  Barriers to Discharge: Decreased family/caregiver support;Medical stability;Hemodialysis;Wound care;Weight;Medication compliance;Incontinence;Neurogenic Bowel & Bladder  Barriers to Discharge Comments: won't turn well- off Coccyx; last several days not participating well since C Diff Possible Resolutions to [employer] Focus: was min A- but worse; for PT and OT- placement SNF when C Diff controlled/reated- hasn't done gait training since 2/10 or SB transfers   Continued Need for Acute Rehabilitation Level of Care: The patient requires daily medical management by a physician with specialized training in physical medicine and rehabilitation for the following reasons: Direction of a multidisciplinary physical rehabilitation program to maximize functional independence : Yes Medical management of patient stability for increased activity during participation in an intensive rehabilitation regime.: Yes Analysis of laboratory values and/or radiology reports with any subsequent need for medication adjustment and/or medical intervention. : Yes   I attest that I was present, lead the team conference, and concur with the assessment and plan of the team.   Cristi Loron 07/06/2020, 12:19 PM

## 2020-07-06 NOTE — Procedures (Signed)
Patient seen on Hemodialysis. BP (!) 136/91   Pulse 91   Temp 98.6 F (37 C) (Oral)   Resp 18   Ht '6\' 6"'$  (1.981 m)   Wt 119 kg   SpO2 100%   BMI 30.32 kg/m   QB 250, UF goal 2L Had some nausea earlier and diarrhea overnight; C difficile and FOBT positive.. Will lock TDC with tPA at the end of dialysis. Has some UOP-- will follow labs closely   Elmarie Shiley MD [facility]. Office # [phone] Pager # [phone] 4:36 PM

## 2020-07-06 NOTE — Consult Note (Addendum)
[facility] Consult: 10:51 AM 07/06/2020  LOS: 30 days    Referring Provider: Dr Dagoberto Ligas  Primary Care Physician:  In [place]: Laurian Brim PA-C Primary Gastroenterologist:  Althia Forts.     Reason for Consultation: Red tinged, FOBT positive stool.   HPI: Fred [name] is a 50 y.o. male. Hx IDDM.  Htn.  ESRD on hemodialysis starting a few months ago..  Diabetic neuropathy.  Morbid obesity.  Anemia, on Retacrit.  Transfused PRBCs in mid 03/2020 at [facility]  (in the [facility] hospital network).  Biliary colic, biliary dyskinesia, acute pancreatitis, sepsis 02/2020, s/p perc cholecystostomy tube.  No CBD stones on MRCP.  Cholecystectomy 03/2020.  Suspect diabetic gastroparesis.  Sacral decubitus.  Positive COVID-19 test 04/20/2020, not previously vaccinated.  Currently residing at [facility]. Admitted 04/29/20 -06/06/20 with sacral and left foot osteomyelitis.  At discharge from hospitalization to [facility] inpatient rehab he continued on Pepcid, low-dose Reglan AC, IV vancomycin and cefepime to finish 1/20. For complaint of nausea, vomiting, abdominal pain a 06/10/2020 single view abdominal film showed diffuse small and large bowel distention consistent with ileus.  No frank obstruction.  Other than albumin of 1.5, alk phos of 274, LFTs were normal on 05/08/2020 but have not been rechecked since then. Developed loose stools a few days ago.  C. difficile antigen and toxin were positive as of 2/14.  Stools reported as rusty colored and he is FOBT positive. Oral vancomycin 125 mg qid initiated this morning 2/15.  Patient has intermittent problems with nausea and sometimes vomiting mostly associated with meals.  Has not seen any blood in his stool himself.  No prior upper endoscopy or  colonoscopy.  Periodic nonfocal abdominal discomfort, not severe pain. In terms of his overall physical status, he was on disability living with his father in [place].  Disability based on diabetes and its complications.  He walked into the hospital at [facility] in September but discharged and continues to be in a wheelchair.  Has no family close at hand in [place], [place] or nearby surrounding areas.  Normocytic anemia with improving Hgb, 10.4 today.  It was as low as 7.0 on 05/07/2020 (PRBC x 1 given 12/17). FOBT positive x 2 on 07/05/2020 INR normal in December 2021.    BMI was 42.9 at the end of September 2021, currently 30.3.    Past Medical History:  Diagnosis Date  . Chronic kidney disease    HD MWF  . Diabetes mellitus without complication ([place])   . Hypertension     Past Surgical History:  Procedure Laterality Date  . CHOLECYSTECTOMY      Prior to Admission medications   Medication Sig Start Date End Date Taking? Authorizing Provider  acetaminophen (TYLENOL) 325 MG tablet Take 650 mg by mouth every 6 (six) hours as needed for mild pain or fever (>101 F).    [provider]  epoetin alfa-epbx (RETACRIT) 74081 UNIT/ML injection Inject 10,000 Units into the skin every Monday, Wednesday, and Friday.    [provider]  famotidine (PEPCID) 20 MG tablet Take 20 mg by mouth 2 (two) times daily.    [provider]  insulin aspart (NOVOLOG) 100 UNIT/ML injection Inject 0-5 Units into the skin at bedtime. 06/06/20   Maudie Mercury, MD  insulin aspart (NOVOLOG) 100 UNIT/ML injection Inject 0-9 Units into the skin 3 (three) times daily with meals. 06/06/20   Maudie Mercury, MD  insulin glargine (LANTUS) 100 UNIT/ML injection Inject 0.14 mLs (14 Units total) into the skin at bedtime. 06/06/20   Maudie Mercury, MD  iron polysaccharides (NIFEREX) 150 MG capsule Take 150 mg by mouth daily.    [provider]  loperamide (IMODIUM) 2 MG  capsule Take 1 capsule (2 mg total) by mouth every 4 (four) hours. 06/06/20   Maudie Mercury, MD  metoCLOPramide (REGLAN) 5 MG tablet Take 5 mg by mouth 3 (three) times daily before meals.    [provider]  multivitamin (RENA-VIT) TABS tablet Take 1 tablet by mouth daily.    [provider]  Nutritional Supplements (FEEDING SUPPLEMENT, NEPRO CARB STEADY,) LIQD Take 237 mLs by mouth 2 (two) times daily between meals. 06/06/20   Maudie Mercury, MD  ondansetron (ZOFRAN) 4 MG tablet Take 1 tablet (4 mg total) by mouth every 6 (six) hours as needed for nausea. 06/06/20   Maudie Mercury, MD  oxyCODONE-acetaminophen (PERCOCET/ROXICET) 5-325 MG tablet Take 1 tablet by mouth every 6 (six) hours as needed for severe pain or moderate pain ((4-6) for up to 5 days).    [provider]    Scheduled Meds: . (feeding supplement) PROSource Plus  30 mL Oral TID BM  . amLODipine  5 mg Oral QHS  . Chlorhexidine Gluconate Cloth  6 each Topical BID  . Chlorhexidine Gluconate Cloth  6 each Topical Q0600  . collagenase   Topical Daily  . darbepoetin (ARANESP) injection - DIALYSIS  200 mcg Intravenous Q Tue-HD  . famotidine  20 mg Oral QHS  . feeding supplement (NEPRO CARB STEADY)  237 mL Oral TID BM  . heparin injection (subcutaneous)  7,500 Units Subcutaneous Q8H  . insulin aspart  0-9 Units Subcutaneous TID WC  . insulin glargine  11 Units Subcutaneous QHS  . lanthanum  500 mg Oral TID WC  . multivitamin  1 tablet Oral QHS  . pneumococcal 23 valent vaccine  0.5 mL Intramuscular Tomorrow-1000  . vancomycin  125 mg Oral QID   Infusions: . iron sucrose Stopped (07/03/20 1835)   PRN Meds: acetaminophen, bisacodyl, hydrALAZINE, loperamide, ondansetron **OR** ondansetron (ZOFRAN) IV, prochlorperazine   Allergies as of 06/06/2020 - Review Complete 06/06/2020  Allergen Reaction Noted  . Atorvastatin Hives 04/29/2020    History reviewed. No pertinent family history.  Social  History   Socioeconomic History  . Marital status: Single    Spouse name: Not on file  . Number of children: Not on file  . Years of education: Not on file  . Highest education level: Not on file  Occupational History  . Not on file  Tobacco Use  . Smoking status: Never Smoker  . Smokeless tobacco: Never Used  Substance and Sexual Activity  . Alcohol use: Never  . Drug use: Never  . Sexual activity: Not on file  Other Topics Concern  . Not on file  Social History Narrative  . Not on file   Social Determinants of Health   Financial Resource Strain: Not on file  Food Insecurity: Not on file  Transportation Needs: Not on file  Physical  Activity: Not on file  Stress: Not on file  Social Connections: Not on file  Intimate Partner Violence: Not on file    REVIEW OF SYSTEMS: Constitutional: Weakness, lethargy. ENT:  No nose bleeds Pulm: Denies shortness of breath or cough. CV:  No palpitations, no LE edema.  Denies angina GU:  No hematuria, no frequency GI: See HPI Heme: No unusual or excessive bleeding or bruising Transfusions: Received 1 PRBC on 05/07/2021 Neuro:  No headaches, no peripheral tingling or numbness Derm:  No itching, no rash or sores.  Endocrine:  No sweats or chills.  No polyuria or dysuria Immunization: Not queried Travel:  None beyond local counties in last few months.    PHYSICAL EXAM: Vital signs in last 24 hours: Vitals:   07/05/20 2143 07/06/20 0607  BP: (!) 164/99 (!) 152/93  Pulse: 100 100  Resp: 18 18  Temp: 98.4 F (36.9 C) 98.6 F (37 C)  SpO2: 100% 100%   Wt Readings from Last 3 Encounters:  06/05/20 (!) 161.9 kg    General: Obese, somewhat chronically ill but not toxic appearing Head: No facial asymmetry or swelling.  No signs of head trauma. Eyes: No conjunctival pallor.  EOMI.  No scleral icterus Ears: Not hard of hearing Nose: No congestion or discharge Mouth: Moist, pink, clear mucosa.  Tongue midline.  Fair  dentition. Neck: No masses or JVD. Lungs: No labored breathing or cough.  Lungs clear to auscultation in front. Heart: RRR.  No MRG.  S1, S2 present Abdomen: Obese, soft.  Not tender.  Not distended.  No HSM, masses, bruits, hernias.  Well-healed drain and surgical scars.  Active bowel sounds.   Rectal: Deferred.  Stool submitted to the lab yesterday was FOBT positive Musc/Skeltl: No joint redness, swelling or gross deformity. Extremities: No CCE. Neurologic: Alert.  Appropriate.  Oriented x3.  Moves all 4 limbs without tremor.  Strength not tested. Skin: No telangiectasia or rash but dermatologic survey incomplete. Nodes: No cervical adenopathy Psych: Flat affect, cooperative.  Laconic.  Intake/Output from previous day: 02/14 0701 - 02/15 0700 In: 3188 [P.O.:236; GD/JM:4268; IV Piggyback:105] Out: 1175 [Urine:1175] Intake/Output this shift: No intake/output data recorded.  LAB RESULTS: Recent Labs    07/05/20 0555 07/05/20 1422 07/05/20 1658 07/06/20 0437 07/06/20 0948  WBC 10.2 11.1*  --   --  10.7*  HGB 10.0* 10.2* 10.0* 10.3* 10.4*  HCT 33.3* 33.7* 31.8* 33.5* 32.9*  PLT 300 336  --   --  343   BMET Lab Results  Component Value Date   NA 135 07/05/2020   NA 135 07/03/2020   NA 134 (L) 07/01/2020   K 4.3 07/05/2020   K 4.9 07/03/2020   K 4.5 07/01/2020   CL 99 07/05/2020   CL 98 07/03/2020   CL 98 07/01/2020   CO2 26 07/05/2020   CO2 25 07/03/2020   CO2 25 07/01/2020   GLUCOSE 113 (H) 07/05/2020   GLUCOSE 139 (H) 07/03/2020   GLUCOSE 110 (H) 07/01/2020   BUN 47 (H) 07/05/2020   BUN 51 (H) 07/03/2020   BUN 47 (H) 07/01/2020   CREATININE 5.46 (H) 07/05/2020   CREATININE 6.14 (H) 07/03/2020   CREATININE 6.79 (H) 07/01/2020   CALCIUM 9.1 07/05/2020   CALCIUM 9.0 07/03/2020   CALCIUM 9.0 07/01/2020   LFT Recent Labs    07/03/20 1654 07/05/20 0555  ALBUMIN 2.0* 1.9*   PT/INR Lab Results  Component Value Date   INR 1.2 04/29/2020   Hepatitis  Panel No results for input(s): HEPBSAG, HCVAB, HEPAIGM, HEPBIGM in the last 72 hours. C-Diff No components found for: CDIFF Lipase  No results found for: LIPASE  Drugs of Abuse  No results found for: LABOPIA, COCAINSCRNUR, LABBENZ, AMPHETMU, THCU, LABBARB   RADIOLOGY STUDIES: No results found.   IMPRESSION:   *   FOBT + stool in pt who tested positive for C. difficile this morning.  Day 1 oral vancomycin. The stool was also FOBT positive a couple of months ago.  Eventually patient should undergo colonoscopy for investigation but not in the setting of active C. difficile.  *   Chronic anemia in pt w ESRD on CSA (Aranesp weekly), Venofer ordered x 5 doses.  Transfused 1 unit in mid 04/2020 as well as mid 03/2020.  *    Intermittent nausea, vomiting.  Previous MDs at outside hospital suspected diabetic gastroparesis, agree this is very likely.  *     IDDM.  *    Sacral and metatarsal osteomyelitis.  Completed antibiotics (IV cefepime, IV vancomycin) 1/20.     *     Cholecystitis, complicated course beginning September 2021.  Had associated pancreatitis.  Treated with perc chole tube and eventually underwent laparoscopic cholecystectomy early 11/21  *    Malnutrition, Weight loss, anorexia in the setting of prolonged hospitalizations and rehab stay.    PLAN:     *    Eventual colonoscopy once C. difficile has subsided. Switch Pepcid to daily Protonix. Continue to treat C. difficile.   Azucena Freed  07/06/2020, 10:51 AM Phone [phone]      Attending Physician Note   I have taken a history, examined the patient and reviewed the chart. I agree with the Advanced Practitioner's note, impression and recommendations.  Occult blood in stool and acute C diff colitis. C diff infection could easily cause FOBT positive stool however previously had occult blood in stool as outpatient. Complete treatment course for C diff and then consider elective colonoscopy in a few weeks.   Fidaxomicin 200 mg po bid for 10 days is the first choice and vancomycin 125 mg po qid for 10 days is second choice for the first episode of C diff. If additional information is required please contact ID.   Chronic anemia with ESRD.  Intermittent N/V. GERD and gastroparesis in this pt with IDDM is certainly possible.   S/P cholecystectomy in Nov 2021 with perc cholecystostomy prior.   Sacral and metatarsal osteomyelitis.   GI signing off. Please reconsult GI after he has completed therapy for C diff.    Lucio Edward, MD FACG [phone]

## 2020-07-06 NOTE — Progress Notes (Signed)
Physical Therapy Session Note  [name] MRN: [id number] Date of Birth: 03/11/1971  Today's Date: 07/06/2020 PT Individual Time: 0800-0945 PT Individual Time Calculation (min): 28 min   Short Term Goals: Week 4:  PT Short Term Goal 1 (Week 5): pt to demonstrate to [facility] mobility at 56' CGA PT Short Term Goal 2 (Week 5): pt to demonstrate min A functional transfers consistently PT Short Term Goal 3 (Week 5): pt to demonstrate supervision bed mobility consistently  Skilled Therapeutic Interventions/Progress Updates:    Patient in supine and reports abdominal pain and RN notified and brought medication.  Patient agreeable to try in bed therex.  Patient supine for ankle pumps x 20 and passive stretch to heel cords 2x 20 sec.  Scooted to [facility] with S using head board.  Performed SLR x 2 x 10, hooklying hip abduction with blue t-band 2 x 10, bridging x 10, SAQ w/ 2# weights 2 x 10 and chest press w/ 3# bar x 15, bicep curls with 3# bar x 15.  Declined to attempt EOB activity and lab tech in room to draw blood so terminated session early w/ pt missing 17 minutes skilled PT.   Therapy Documentation Precautions:  Precautions Precautions: Fall Precaution Comments: wounds on bilat feet and L sacrum, inconsistent orthostatic BPs Required Braces or Orthoses: Other Brace Other Brace: abdominal binder PRN with standing/strenuous activity Restrictions Weight Bearing Restrictions: No General: PT Amount of Missed Time (min): 17 Minutes PT Missed Treatment Reason: Patient ill (Comment);Pain;Patient unwilling to participate   Pain: Pain Assessment Pain Scale: 0-10 Pain Score: 0-No pain   Therapy/Group: Individual Therapy  Reginia Naas  Magda Kiel, PT 07/06/2020, 9:59 AM

## 2020-07-07 LAB — GLUCOSE, CAPILLARY
Glucose-Capillary: 74 mg/dL (ref 70–99)
Glucose-Capillary: 77 mg/dL (ref 70–99)
Glucose-Capillary: 100 mg/dL — ABNORMAL HIGH (ref 70–99)
Glucose-Capillary: 121 mg/dL — ABNORMAL HIGH (ref 70–99)

## 2020-07-07 LAB — HEMOGLOBIN AND HEMATOCRIT, BLOOD
HCT: 33.3 % — ABNORMAL LOW (ref 39.0–52.0)
HCT: 33.8 % — ABNORMAL LOW (ref 39.0–52.0)
Hemoglobin: 10.5 g/dL — ABNORMAL LOW (ref 13.0–17.0)
Hemoglobin: 10.5 g/dL — ABNORMAL LOW (ref 13.0–17.0)

## 2020-07-07 MED ORDER — MENTHOL 3 MG MT LOZG
1.0000 | LOZENGE | OROMUCOSAL | Status: DC | PRN
  Administered 2020-07-07: 3 mg via ORAL
  Filled 2020-07-07: qty 9

## 2020-07-07 NOTE — Progress Notes (Signed)
Physical Therapy Session Note  Patient Details  Name: Wilder Kurowski MRN: [id number] Date of Birth: [birth year]  Today's Date: 07/07/2020 PT Individual Time: 0805-0905 PT Individual Time Calculation (min): 60 min   Short Term Goals: Week 5:  PT Short Term Goal 1 (Week 5): pt to demonstrate to [facility] mobility at 74' CGA PT Short Term Goal 2 (Week 5): pt to demonstrate min A functional transfers consistently PT Short Term Goal 3 (Week 5): pt to demonstrate supervision bed mobility consistently  Skilled Therapeutic Interventions/Progress Updates: Pt presented in bed sleep[ing but easily aroused. P{t stating continues to feel "bad" and passed out while sitting EOB last night with nsg while switching bed. With extensive encouragement pt agreeable to slowly work to sitting EOB. Vitals as noted below and throughout session pt exp[ressed feeling nauseous but no significant s/s of hypotension. PTA donned ace bandages and pt performed ankle pumps and heel slides with manually resisted leg p[ress x 10 bilaterally. Pt noted to move very slowly and limited exertion on leg press.  MD arrived for daily assessment. Once completed pt's bed raised and pt tolerated sitting up leaning against HOB ~10 min. Pt indicated increased  nausea but back to baseline after a few minutes. Pt performed LAQ to fatigue with PTA providing gentle hamstring stretch in end range. PTA encouraged PO intake as pt's breakfast had not been touched. Provided continued edu on importance of nutrition for recovery with pt verbalizing understanding. Pt was able to come to complete sitting EOB with no increase s/s. PTA encouraged pt to transfer to w/c as next session to dovetail with current session. Despite extensive encouragement pt refused transfer to chair. Pt returned to supine at end of session with supervision and use of bed features. Pt left with HOB elevated and pt eating fruit and current needs met.   BP supine: 130/81 (97) HR 95  HOB 30 degrees  with ACE bandages 120/89 (100) HR 101 Sitting EOB 118/86 (97) 108 Extended sitting: 124/81 (96) HR 110     Therapy Documentation Precautions:  Precautions Precautions: Fall Precaution Comments: wounds on bilat feet and L sacrum, inconsistent orthostatic BPs Required Braces or Orthoses: Other Brace Other Brace: abdominal binder PRN with standing/strenuous activity Restrictions Weight Bearing Restrictions: No General:   Vital Signs: Therapy Vitals Temp: 100.1 F (37.8 C) Temp Source: Oral Pulse Rate: 97 Resp: 19 BP: (!) 153/94 Patient Position (if appropriate): Lying Oxygen Therapy SpO2: 97 % O2 Device: Room Air Pain:   Mobility:   Locomotion :    Trunk/Postural Assessment :    Balance:   Exercises:   Other Treatments:      Therapy/Group: Individual Therapy  Adrijana Haros 07/07/2020, 4:20 PM

## 2020-07-07 NOTE — Progress Notes (Signed)
[facility] Progress Note   Assessment/ Plan:   1.  History of prolonged hospitalization with COVID-19 and consequent debilitation/sacrococcygeal decubitus/osteomyelitis: Status post completion of intravenous antibiotics and ongoing inpatient rehabilitation. 2. Clostridium difficile associated diarrhea: on oral vancomycin with continued diarrhea and occasional nauseas. PRN imodium and zofran/compazine noted.   3. ESRD: Continue hemodialysis on a TTS schedule with next hemodialysis due tomorrow-- I will not pull off any fluid with his diarrheal losses and description of orthostatic dizziness; he does not have any acute dialysis indications at this time. 4. Anemia: Without overt blood loss, continue monitoring hemoglobin/hematocrit trend-on high-dose ESA. 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 6. Nutrition: Continue renal diet with ongoing fluid restriction and nutritional supplements/renal MVI. 7. Hypertension: Blood pressure within acceptable range with intermittent elevations noted-monitor with ongoing treatment on amlodipine/hydralazine.  Subjective:   Reports to be feeling poorly and fatigued this morning with poor sleep overnight, diarrhea and some abdominal discomfort.    Objective:   BP 135/79 (BP Location: Left Arm)   Pulse 96   Temp 98.2 F (36.8 C) (Oral)   Resp 18   Ht '6\' 6"'$  (1.981 m)   Wt 119 kg   SpO2 99%   BMI 30.32 kg/m   Physical Exam: Gen: Resting in bed- appears fatigued/sleepy CVS: Pulse regular rhythm, normal rate, S1 and S2 normal Resp: Anteriorly clear to auscultation, no distinct rales or rhonchi.  Right IJ TDC in place Abd: Soft, obese, nontender, bowel sounds normal Ext: No ankle edema bilaterally  Labs: BMET Recent Labs  Lab 07/01/20 1229 07/03/20 1654 07/05/20 0555 07/06/20 1606  NA 134* 135 135 135  K 4.5 4.9 4.3 5.1  CL 98 98 99 99  CO2 '25 25 26 26  '$ GLUCOSE 110* 139* 113* 96  BUN 47* 51* 47* 60*   CREATININE 6.79* 6.14* 5.46* 6.72*  CALCIUM 9.0 9.0 9.1 9.2  PHOS 4.2 4.0 3.9 4.7*   CBC Recent Labs  Lab 07/03/20 1654 07/05/20 0555 07/05/20 1422 07/05/20 1658 07/06/20 0437 07/06/20 0948 07/06/20 1700 07/07/20 0503  WBC 10.4 10.2 11.1*  --   --  10.7*  --   --   NEUTROABS  --  6.5  --   --   --  6.9  --   --   HGB 9.9* 10.0* 10.2*   < > 10.3* 10.4* 10.1* 10.5*  HCT 33.0* 33.3* 33.7*   < > 33.5* 32.9* 33.3* 33.3*  MCV 84.6 83.9 85.5  --   --  83.1  --   --   PLT 330 300 336  --   --  343  --   --    < > = values in this interval not displayed.      Medications:    . amLODipine  5 mg Oral QHS  . Chlorhexidine Gluconate Cloth  6 each Topical BID  . Chlorhexidine Gluconate Cloth  6 each Topical Q0600  . collagenase   Topical Daily  . darbepoetin (ARANESP) injection - DIALYSIS  200 mcg Intravenous Q Tue-HD  . feeding supplement (NEPRO CARB STEADY)  237 mL Oral TID BM  . heparin injection (subcutaneous)  7,500 Units Subcutaneous Q8H  . insulin aspart  0-9 Units Subcutaneous TID WC  . insulin glargine  11 Units Subcutaneous QHS  . lanthanum  500 mg Oral TID WC  . multivitamin  1 tablet Oral QHS  . pantoprazole  40 mg Oral Q0600  . pneumococcal 23 valent vaccine  0.5 mL Intramuscular Tomorrow-1000  . vancomycin  125 mg Oral QID    Elmarie Shiley, MD 07/07/2020, 11:18 AM

## 2020-07-07 NOTE — Progress Notes (Signed)
Occupational Therapy Session Note  [name] MRN: [id number] Date of Birth: 27-Jan-1971  Today's Date: 07/07/2020 OT Individual Time: 0539-7673 OT Individual Time Calculation (min): 8 min    Short Term Goals: Week 1:  OT Short Term Goal 1 (Week 1): patient will completed bed mobility with CS OT Short Term Goal 1 - Progress (Week 1): Progressing toward goal OT Short Term Goal 2 (Week 1): patient will complete sit pivot transfers with min A OT Short Term Goal 2 - Progress (Week 1): Not met OT Short Term Goal 3 (Week 1): patient will complete lower body bathing and dressing with min A and use of assistive devices as needed OT Short Term Goal 3 - Progress (Week 1): Progressing toward goal OT Short Term Goal 4 (Week 1): patient will tolerate sitting out of bed for 3-4 hours OT Short Term Goal 4 - Progress (Week 1): Not met  Skilled Therapeutic Interventions/Progress Updates:    1:1. Pt received in bed agreeable to OT. Pt received in bed partially on side eating with HOB slightly elevated. Encouraged pt to sit EOB to self feed for ipmroved positioning and upright tolerance d/t B being good this morning. Pt refused. Pt states, "I just dont feel good." Pt continued to refuse any tx, grooming at sink, ADL, getting EOB or OOB to w/c. Pt reporting passing out last night at EOB during sheet change as reason fearful of getting up. Educated pt need to push to continue to improve mobility and independence. Pt states, "that just doesn't work for me." Pt missed 20 min skilled OT d/t refusal to participate.   Therapy Documentation Precautions:  Precautions Precautions: Fall Precaution Comments: wounds on bilat feet and L sacrum, inconsistent orthostatic BPs Required Braces or Orthoses: Other Brace Other Brace: abdominal binder PRN with standing/strenuous activity Restrictions Weight Bearing Restrictions: No General:   Vital Signs: Therapy Vitals Temp: 98.2 F (36.8  C) Temp Source: Oral Pulse Rate: 96 Resp: 18 BP: 135/79 Patient Position (if appropriate): Lying Oxygen Therapy SpO2: 99 % O2 Device: Room Air Pain:   ADL: ADL Eating: Set up (per staff report) Where Assessed-Eating: Bed level Grooming: Modified independent Where Assessed-Grooming: Sitting at sink,Wheelchair Upper Body Bathing: Setup Where Assessed-Upper Body Bathing: Edge of bed Lower Body Bathing: Minimal assistance Where Assessed-Lower Body Bathing: Bed level Upper Body Dressing: Setup Where Assessed-Upper Body Dressing: Sitting at sink,Wheelchair Lower Body Dressing: Setup Where Assessed-Lower Body Dressing: Bed level Toileting: Maximal assistance Where Assessed-Toileting: Bed level Toilet Transfer: Not assessed (not assessed d/t safety) Vision   Perception    Praxis   Exercises:   Other Treatments:     Therapy/Group: Individual Therapy  Tonny Branch 07/07/2020, 6:49 AM

## 2020-07-07 NOTE — Progress Notes (Signed)
Physical Therapy Session Note  [name] MRN: [id number] Date of Birth: [birth year]  Today's Date: 07/07/2020 PT Individual Time: 1350-1415 PT Individual Time Calculation (min): 25 min   Short Term Goals: Week 5:  PT Short Term Goal 1 (Week 5): pt to demonstrate to [facility] mobility at 81' CGA PT Short Term Goal 2 (Week 5): pt to demonstrate min A functional transfers consistently PT Short Term Goal 3 (Week 5): pt to demonstrate supervision bed mobility consistently  Skilled Therapeutic Interventions/Progress Updates:     Patient in bed upon PT arrival. Patient denied pain during session, however, reporting feeling poorly due to frequent BMs and nausea/emisis today. Patient also reported feeling light headed in the bed and declined sitting EOB or OOB mobility during session. Patient agreeable to bed level exercises.   Vitals: BP 132/88, HR 99  Therapeutic Exercise: Patient performed the following exercises with verbal and tactile cues for proper technique. -bridging 2x5 -B SLR 4x5 -B knee/hip flexion 4x5 -glut sets x10 with 5 sec hold -quad sets x10 with 5 sec hold  Patient expressed that he was emotional and frustrated with his lack of progress and illness over the past few days. PT provided therapeutic listening and education on benefits of sitting up and mobility for improved recovery. Patient agreeable to sitting in chair position in the bed after.   Patient in bed in chair position at end of session with breaks locked, bed alarm set, and all needs within reach.    Therapy Documentation Precautions:  Precautions Precautions: Fall Precaution Comments: wounds on bilat feet and L sacrum, inconsistent orthostatic BPs Required Braces or Orthoses: Other Brace Other Brace: abdominal binder PRN with standing/strenuous activity Restrictions Weight Bearing Restrictions: No   Therapy/Group: Individual Therapy  Faylinn Schwenn L [name] PT, DPT  07/07/2020, 5:18 PM

## 2020-07-07 NOTE — Progress Notes (Signed)
Occupational Therapy Session Note  [name] MRN: [id number] Date of Birth: 08/18/1970  Today's Date: 07/07/2020 OT Individual Time: 1500-1530 OT Individual Time Calculation (min): 30 min  Missed 30 mins d/t fatigue/feeling ill   Short Term Goals: Week 2:  OT Short Term Goal 1 (Week 2): Pt will tolerate sitting OOB for 2-3 hours OT Short Term Goal 1 - Progress (Week 2): Progressing toward goal OT Short Term Goal 2 (Week 2): Pt will complete LB bathing and dressing with Min A w/ AE PRN OT Short Term Goal 2 - Progress (Week 2): Met OT Short Term Goal 3 (Week 2): Pt will complete transfer to [facility] with LRAD with Min A OT Short Term Goal 3 - Progress (Week 2): Progressing toward goal OT Short Term Goal 4 (Week 2): Pt will perform sit to stand at [facility] with Max A of 1 consistently OT Short Term Goal 4 - Progress (Week 2): Met Week 3:  OT Short Term Goal 1 (Week 3): STGs=LTGs due to ELOS OT Short Term Goal 1 - Progress (Week 3): Progressing toward goal Week 4:  OT Short Term Goal 1 (Week 4): STGs=LTGs due to ELOS  Skilled Therapeutic Interventions/Progress Updates:    Pt greeted at time of session supine in bed with HOB elevated, resting comfortably and no pain but very uncomfortable abdomen and states that he feels "terrible." Conversation regarding importance of decreasing time spent in supine for BP and decrease time on sacral wound. Pt stating "I know" to all education and saying he wants to participate but feels too bad. BP checked, WNL. With encouragement, agreeable to sit EOB and performed seated 2x10-15 reps of BUE AROM including chest pulls/pushes, overhead raises, and shoulder rolls forward/backward. Declined all ADL including dressing, bathing, and checking brief. Pt very fatigued, returned to semireclined position all needs met and call bell in reach. Missed 30 minutes.  Therapy Documentation Precautions:  Precautions Precautions: Fall Precaution Comments:  wounds on bilat feet and L sacrum, inconsistent orthostatic BPs Required Braces or Orthoses: Other Brace Other Brace: abdominal binder PRN with standing/strenuous activity Restrictions Weight Bearing Restrictions: No    Therapy/Group: Individual Therapy  [name] 07/07/2020, 7:28 AM

## 2020-07-07 NOTE — Progress Notes (Signed)
[facility] PHYSICAL MEDICINE & REHABILITATION PROGRESS NOTE   Subjective/Complaints:  Pt reports abdomen is sore- - didn't sleep well- bed broke- needed new bed.  BP was so low yesterday pt reports, he "passed out", blacked out- when sitting EOB.  Doesn't know if fell. Nothing documented in chart.     ROS:   Pt denies SOB, abd pain, CP, N/V/C/D, and vision changes    Objective:   No results found. Recent Labs    07/05/20 1422 07/05/20 1658 07/06/20 0948 07/06/20 1700 07/07/20 0503  WBC 11.1*  --  10.7*  --   --   HGB 10.2*   < > 10.4* 10.1* 10.5*  HCT 33.7*   < > 32.9* 33.3* 33.3*  PLT 336  --  343  --   --    < > = values in this interval not displayed.   Recent Labs    07/05/20 0555 07/06/20 1606  NA 135 135  K 4.3 5.1  CL 99 99  CO2 26 26  GLUCOSE 113* 96  BUN 47* 60*  CREATININE 5.46* 6.72*  CALCIUM 9.1 9.2    Intake/Output Summary (Last 24 hours) at 07/07/2020 0924 Last data filed at 07/06/2020 2106 Gross per 24 hour  Intake 356 ml  Output 2178 ml  Net -1822 ml     Pressure Injury 05/01/20 Buttocks Left Stage 4 - Full thickness tissue loss with exposed bone, tendon or muscle. packed with santyl gauze, dressed by WOCN, (Active)  05/01/20 0230  Location: Buttocks  Location Orientation: Left  Staging: Stage 4 - Full thickness tissue loss with exposed bone, tendon or muscle.  Wound Description (Comments): packed with santyl gauze, dressed by WOCN,  Present on Admission: Yes     Pressure Injury 05/01/20 Ankle Left;Lateral Unstageable - Full thickness tissue loss in which the base of the injury is covered by slough (yellow, tan, gray, green or brown) and/or eschar (tan, brown or black) in the wound bed. smal circular wound on the (Active)  05/01/20 0230  Location: Ankle  Location Orientation: Left;Lateral  Staging: Unstageable - Full thickness tissue loss in which the base of the injury is covered by slough (yellow, tan, gray, green or brown) and/or  eschar (tan, brown or black) in the wound bed.  Wound Description (Comments): smal circular wound on the bone  Present on Admission: Yes     Pressure Injury 05/01/20 Foot Left;Lateral Unstageable - Full thickness tissue loss in which the base of the injury is covered by slough (yellow, tan, gray, green or brown) and/or eschar (tan, brown or black) in the wound bed. 4th and 5th toes appear nec (Active)  05/01/20 0230  Location: Foot (4th and 5th toes and lateral/anterior side of the sole)  Location Orientation: Left;Lateral  Staging: Unstageable - Full thickness tissue loss in which the base of the injury is covered by slough (yellow, tan, gray, green or brown) and/or eschar (tan, brown or black) in the wound bed.  Wound Description (Comments): 4th and 5th toes appear necrotic circular wound on sole  Present on Admission: Yes     Pressure Injury 05/01/20 Foot Right;Medial;Posterior Unstageable - Full thickness tissue loss in which the base of the injury is covered by slough (yellow, tan, gray, green or brown) and/or eschar (tan, brown or black) in the wound bed. large circular wo (Active)  05/01/20 0230  Location: Foot  Location Orientation: Right;Medial;Posterior  Staging: Unstageable - Full thickness tissue loss in which the base of the injury is  covered by slough (yellow, tan, gray, green or brown) and/or eschar (tan, brown or black) in the wound bed.  Wound Description (Comments): large circular wound on sole  and heel  Present on Admission: Yes     Pressure Injury 05/08/20 Heel Right Unstageable - Full thickness tissue loss in which the base of the injury is covered by slough (yellow, tan, gray, green or brown) and/or eschar (tan, brown or black) in the wound bed. (Active)  05/08/20 1718  Location: Heel  Location Orientation: Right  Staging: Unstageable - Full thickness tissue loss in which the base of the injury is covered by slough (yellow, tan, gray, green or brown) and/or eschar (tan,  brown or black) in the wound bed.  Wound Description (Comments):   Present on Admission: Yes    Physical Exam: Vital Signs Blood pressure 135/79, pulse 96, temperature 98.2 F (36.8 C), temperature source Oral, resp. rate 18, height 6' 6"  (1.981 m), weight 119 kg, SpO2 99 %. Constitutional: laying supine in bed; appropriate, NAD HEENT: EOMI, oral membranes moist Neck: supple Cardiovascular: RRR- no JVD Respiratory/Chest: CTA B/L- no W/R/R- good air movement GI/Abdomen:  Soft, diffusely mild TTP, no rebound; slightly hypoactive BS; non distended Ext: no clubbing, cyanosis, or edema Psych: a little upset about BP issues Extremities: edema in extremities.  No tenderness in extremities. Neuro: Alert Motor: Bilateral upper extremities: 5/5 proximal distal Bilateral lower extremities: 4-/5 proximal distal   Skin: Multiple foot wounds. L foot- has ruptured blood blister 5th MTP base; also 4th toe is dry gangrene- all black eschar. Large sacral wound  Assessment/Plan: 1. Functional deficits which require 3+ hours per day of interdisciplinary therapy in a comprehensive inpatient rehab setting.  Physiatrist is providing close team supervision and 24 hour management of active medical problems listed below.  Physiatrist and rehab team continue to assess barriers to discharge/monitor patient progress toward functional and medical goals  Care Tool:  Bathing    Body parts bathed by patient: Right arm,Left arm,Chest,Abdomen,Right upper leg,Left upper leg,Face,Right lower leg,Left lower leg   Body parts bathed by helper: Front perineal area,Buttocks     Bathing assist Assist Level: Minimal Assistance - Patient > 75%     Upper Body Dressing/Undressing Upper body dressing   What is the patient wearing?: Pull over shirt    Upper body assist Assist Level: Set up assist    Lower Body Dressing/Undressing Lower body dressing      What is the patient wearing?: Pants     Lower body  assist Assist for lower body dressing: Minimal Assistance - Patient > 75%     Toileting Toileting    Toileting assist Assist for toileting: Minimal Assistance - Patient > 75% Assistive Device Comment:  (urinal)   Transfers Chair/bed transfer  Transfers assist  Chair/bed transfer activity did not occur: Safety/medical concerns (Per nursing report)  Chair/bed transfer assist level: Minimal Assistance - Patient > 75% Chair/bed transfer assistive device: Sliding board,Armrests   Locomotion Ambulation   Ambulation assist   Ambulation activity did not occur: Safety/medical concerns (fatigue, lightheadedness, generalized weakness)  Assist level: Minimal Assistance - Patient > 75% Assistive device: Parallel bars Max distance: 24 ft   Walk 10 feet activity   Assist  Walk 10 feet activity did not occur: Safety/medical concerns (fatigue, lightheadedness, generalized weakness)  Assist level: Minimal Assistance - Patient > 75% Assistive device: Parallel bars   Walk 50 feet activity   Assist Walk 50 feet with 2 turns activity did not occur:  Safety/medical concerns (fatigue, lightheadedness, generalized weakness)         Walk 150 feet activity   Assist Walk 150 feet activity did not occur: Safety/medical concerns (fatigue, lightheadedness, generalized weakness)         Walk 10 feet on uneven surface  activity   Assist Walk 10 feet on uneven surfaces activity did not occur: Safety/medical concerns (fatigue, lightheadedness, generalized weakness)         Wheelchair     Assist Will patient use wheelchair at discharge?: Yes Type of Wheelchair: Manual    Wheelchair assist level: Supervision/Verbal cueing Max wheelchair distance: 100    Wheelchair 50 feet with 2 turns activity    Assist        Assist Level: Supervision/Verbal cueing   Wheelchair 150 feet activity     Assist      Assist Level: Minimal Assistance - Patient > 75%   Blood  pressure 135/79, pulse 96, temperature 98.2 F (36.8 C), temperature source Oral, resp. rate 18, height 6' 6"  (1.981 m), weight 119 kg, SpO2 99 %.  Medical Problem List and Plan: 1.  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  Antithrombotics: -DVT/anticoagulation: Subcutaneous heparin.               -antiplatelet therapy: N/A 3. Pain Management: Tylenol as needed  2/11- denied pain- con't regimen  2/16- having abdominal pain but controlled with tylenol 4. Mood: Provide emotional support             -antipsychotic agents: N/A 5. Neuropsych: This patient is? capable of making decisions on his own behalf. 6. Skin/Wound Care: Routine skin checks 7. Fluids/Electrolytes/Nutrition: Routine in and outs with follow-up chemistries 8.  ESRD.  Hemodialysis per renal services 9.  Sacral decubitus ulcer as well as ischemic ulcers on the left foot fourth toe and fifth metatarsal head and right heel and fifth metatarsal head.  Wound care nurse follow-up.  Patient to see Dr. Sharol Given outpatient.  Continue bilateral PRAFO's. 10.  ID/ coccygeal osteomyelitis.    Completed vancomycin and cefepime on 06/10/2020  1/18- will schedule turning q2 hours to get off wounds- also will make sure on specialty bed  1/20- is on specialty bed  Repeat labs ordered for Monday  2/7- CRP 6.5- ESR pending - con't regimen  2/8- ESR  DOWN TO 67 FROM 73- SO HEADING IN RIGHT DIRECTION  2/9- Spiked temp last night of 100.9- restarted Vanc/Cefipime- pt refused Vanc "since causes diarrhea". WBC now normal again this AM- will con't for now. CXR (-); is anuric-  2/10- having fevers/sweats/chills- Tm 99.4- needs to also take Vanc- told this to pt- also, called ID and also ordered another CT of pelvis due to Sx's-    2/13 ID saw pt Friday. Thinks that low grade temps are coming from HD cath. Was not impressed by MRI of pelvis. Recommended stopping abx (d/t loose stool)   -continue to  monitor   -continue [facility] per nephrology   -afebrile last 36 hours  2/14- No fevers even low grafde x 48+ hours- con't off ABX.  2/15- Won't turn- always on backside in bed.   10.  Anemia of chronic disease.  Continue Aranesp.   Hemoglobin 10.0 on 2/3, labs with HD 11.  Diabetes mellitus with peripheral neuropathy .  Hemoglobin A1c 5.6.  Lantus insulin 11 units nightly  CBG (last 3)  Recent Labs    07/06/20 1854 07/06/20 2116 07/07/20 0605  GLUCAP 84 82  74    2/16- BGs almost too good of control 74-84 12.  Morbid obesity.  BMI 38.37.  Dietary follow-up 13. Dizziness/nausea with HTN and hypotension  Vitals:   07/06/20 2128 07/07/20 0511  BP: 118/83 135/79  Pulse:  96  Resp:  18  Temp:  98.2 F (36.8 C)  SpO2:  99%    Hydralazine prn   Norvasc to 7.5 mg daily   2/9- BP 156/85 this AM- but has been controlled last few days  2/12-13-bps still with some fluctuation but not severe. Continue current meds, volume mgt per nephrology  2/15- BP's 150s-160s/90s- on Norvasc 7.5- can increase, but usually has orthostatic hypotension when we do. Will d/w Renal  2/16- "passed out" yesterday= will call renal  14. Diarrhea with new constipation  Bowel meds increased on 2/5  2/7- will decrease bowel meds again since 8+ BMs in last 24 hours- con't regimen  2/12 stool still mushy. Stopped miralax  2/14- will allow them to use the Imodium prn that's ordered- since still having so many stools- he does this from IV ABX 15. Leukocytosis: Resolved  ESR/CRP and CBC with diff ordered for Monday.  2/7- CRP 6.5- stable- con't regimen   2/8- EST 67- down to 73- con't regimen  2/11- Labs looking slightly better, but imaging does not- waiting to hear from ID  2/14- per ID, pelvis osteomyelitis not the issue- off ABX per their request 16. N/Vomiting-  2/10- likely due to underlying illness- will do ID w/u as above.  17. C Diff  2/15- has new C Diff with hx of C Diff- also has bowel incontinence due to  sphincter issues- will start PO Vanc for him- d/w pharmacy- same dose.   2/16- abdominal pain slightly better 16. Dispo  2/8- pt unhappy that has to back to SNF- however has been participating intermittently- finally doing more/more interactive, but we can only keep max of 30 days. Sent FL2  2/9- pt refused PT yesterday -only bedlevel Exercises- pt needs max encouragement to progress with therapy   2/10- might have to hold d/c- we will see.  2/11- might not be able to d/c Monday per SNF search/SW   2/15- told SW that cannot d/c right now due to C Diff.    LOS: 31 days A FACE TO FACE EVALUATION WAS PERFORMED  Casi Westerfeld 07/07/2020, 9:24 AM

## 2020-07-08 ENCOUNTER — Inpatient Hospital Stay (HOSPITAL_COMMUNITY): Payer: Medicare Other

## 2020-07-08 LAB — RENAL FUNCTION PANEL
Albumin: 2.1 g/dL — ABNORMAL LOW (ref 3.5–5.0)
Anion gap: 9 (ref 5–15)
BUN: 47 mg/dL — ABNORMAL HIGH (ref 6–20)
CO2: 25 mmol/L (ref 22–32)
Calcium: 9.1 mg/dL (ref 8.9–10.3)
Chloride: 98 mmol/L (ref 98–111)
Creatinine, Ser: 6.55 mg/dL — ABNORMAL HIGH (ref 0.61–1.24)
GFR, Estimated: 10 mL/min — ABNORMAL LOW (ref >60)
Glucose, Bld: 138 mg/dL — ABNORMAL HIGH (ref 70–99)
Phosphorus: 4.4 mg/dL (ref 2.5–4.6)
Potassium: 5.1 mmol/L (ref 3.5–5.1)
Sodium: 132 mmol/L — ABNORMAL LOW (ref 135–145)

## 2020-07-08 LAB — GLUCOSE, CAPILLARY
Glucose-Capillary: 86 mg/dL (ref 70–99)
Glucose-Capillary: 103 mg/dL — ABNORMAL HIGH (ref 70–99)
Glucose-Capillary: 87 mg/dL (ref 70–99)
Glucose-Capillary: 141 mg/dL — ABNORMAL HIGH (ref 70–99)

## 2020-07-08 LAB — CBC
HCT: 32.1 % — ABNORMAL LOW (ref 39.0–52.0)
Hemoglobin: 9.9 g/dL — ABNORMAL LOW (ref 13.0–17.0)
MCH: 25.6 pg — ABNORMAL LOW (ref 26.0–34.0)
MCHC: 30.8 g/dL (ref 30.0–36.0)
MCV: 82.9 fL (ref 80.0–100.0)
Platelets: 295 10*3/uL (ref 150–400)
RBC: 3.87 MIL/uL — ABNORMAL LOW (ref 4.22–5.81)
RDW: 16.1 % — ABNORMAL HIGH (ref 11.5–15.5)
WBC: 8.3 10*3/uL (ref 4.0–10.5)
nRBC: 0 % (ref 0.0–0.2)

## 2020-07-08 LAB — HEMOGLOBIN AND HEMATOCRIT, BLOOD
HCT: 33.7 % — ABNORMAL LOW (ref 39.0–52.0)
Hemoglobin: 10.7 g/dL — ABNORMAL LOW (ref 13.0–17.0)

## 2020-07-08 MED ORDER — HEPARIN SODIUM (PORCINE) 1000 UNIT/ML DIALYSIS
40.0000 [IU]/kg | INTRAMUSCULAR | Status: DC | PRN
  Filled 2020-07-08: qty 5

## 2020-07-08 MED ORDER — FLUTICASONE PROPIONATE 50 MCG/ACT NA SUSP
2.0000 | Freq: Every day | NASAL | Status: DC
  Administered 2020-07-08 – 2020-07-16 (×9): 2 via NASAL
  Filled 2020-07-08: qty 16

## 2020-07-08 MED ORDER — HEPARIN SODIUM (PORCINE) 1000 UNIT/ML IJ SOLN
INTRAMUSCULAR | Status: AC
  Filled 2020-07-08: qty 4

## 2020-07-08 NOTE — Progress Notes (Signed)
Physical Therapy Session Note  [name] MRN: [id number] Date of Birth: 1970/11/20  Today's Date: 07/08/2020 PT Individual Time: [id number] PT Individual Time Calculation (min): 54 min   Short Term Goals: Week 5:  PT Short Term Goal 1 (Week 5): pt to demonstrate to [facility] mobility at 87' CGA PT Short Term Goal 2 (Week 5): pt to demonstrate min A functional transfers consistently PT Short Term Goal 3 (Week 5): pt to demonstrate supervision bed mobility consistently  Skilled Therapeutic Interventions/Progress Updates:    pt received in bed and agreeable to therapy at EOB only 2/2 5/10 stomach pain, fatigue, and nausea. Pt directed in supine>sit EOB min A for trunk support into sitting. Pt then requested to attempt to eat breakfast tray. Pt setup for this at EOB with unsupported trunk and pt encouraged to sit in good posture, tray placed in front of pt with distance away to encourage safe forward reaching for needs and promote increased tolerance to sitting position. Pt able to tolerate sitting upright for 40 mins at SBA grossly. Pt then requested to return to supine to rest. Pt denied to transfer OOB multiple times despite max encouragement. Pt educated on importance of OOB sitting tolerance, transfer training, standing training and tolerance and agreed but reported he could not tolerate abdominal binder 2/2 stomach pain and nausea and did not feel well this AM. Pt directed in sitting EOB exercises 2x20 hip flexion, knee extension, hip adduction, hip abduction, ankle DF and PF, glute squeezes. Pt then directed in sit>supine SBA and mod I for repositioning with bedrails.  Pt left in bed, All needs in reach and in good condition. Call light in hand at end of session.   Therapy Documentation Precautions:  Precautions Precautions: Fall Precaution Comments: wounds on bilat feet and L sacrum, inconsistent orthostatic BPs Required Braces or Orthoses: Other Brace Other Brace:  abdominal binder PRN with standing/strenuous activity Restrictions Weight Bearing Restrictions: No General:   Vital Signs: Therapy Vitals Temp: 99.5 F (37.5 C) Temp Source: Oral Pulse Rate: 89 Resp: 18 BP: 136/86 Patient Position (if appropriate): Lying Oxygen Therapy SpO2: 100 % O2 Device: Room Air Pain: Pain Assessment Pain Scale: 0-10 Pain Score: 0-No pain Mobility:   Locomotion :    Trunk/Postural Assessment :    Balance:   Exercises:   Other Treatments:      Therapy/Group: Individual Therapy  [name] 07/08/2020, 9:02 AM

## 2020-07-08 NOTE — Procedures (Signed)
I was present at this dialysis session. I have reviewed the session itself and made appropriate changes.   2K bath. NO UF. T DC, Qb 400. Pt tolerating treatment well.   Filed Weights   07/08/20 1305  Weight: 115.9 kg    Recent Labs  Lab 07/06/20 1606  NA 135  K 5.1  CL 99  CO2 26  GLUCOSE 96  BUN 60*  CREATININE 6.72*  CALCIUM 9.2  PHOS 4.7*    Recent Labs  Lab 07/05/20 0555 07/05/20 1422 07/05/20 1658 07/06/20 0948 07/06/20 1700 07/07/20 1608 07/08/20 0511 07/08/20 1322  WBC 10.2 11.1*  --  10.7*  --   --   --  8.3  NEUTROABS 6.5  --   --  6.9  --   --   --   --   HGB 10.0* 10.2*   < > 10.4*   < > 10.5* 10.7* 9.9*  HCT 33.3* 33.7*   < > 32.9*   < > 33.8* 33.7* 32.1*  MCV 83.9 85.5  --  83.1  --   --   --  82.9  PLT 300 336  --  343  --   --   --  295   < > = values in this interval not displayed.    Scheduled Meds: . amLODipine  5 mg Oral QHS  . Chlorhexidine Gluconate Cloth  6 each Topical BID  . Chlorhexidine Gluconate Cloth  6 each Topical Q0600  . collagenase   Topical Daily  . darbepoetin (ARANESP) injection - DIALYSIS  200 mcg Intravenous Q Tue-HD  . feeding supplement (NEPRO CARB STEADY)  237 mL Oral TID BM  . fluticasone  2 spray Each Nare Daily  . heparin injection (subcutaneous)  7,500 Units Subcutaneous Q8H  . insulin aspart  0-9 Units Subcutaneous TID WC  . insulin glargine  11 Units Subcutaneous QHS  . lanthanum  500 mg Oral TID WC  . multivitamin  1 tablet Oral QHS  . pantoprazole  40 mg Oral Q0600  . pneumococcal 23 valent vaccine  0.5 mL Intramuscular Tomorrow-1000  . vancomycin  125 mg Oral QID   Continuous Infusions: . iron sucrose 100 mg (07/06/20 1833)   PRN Meds:.acetaminophen, bisacodyl, heparin, hydrALAZINE, loperamide, menthol-cetylpyridinium, ondansetron **OR** ondansetron (ZOFRAN) IV, prochlorperazine   Pearson Grippe  MD 07/08/2020, 2:08 PM

## 2020-07-08 NOTE — Progress Notes (Signed)
Physical Therapy Session Note  [name] MRN: [id number] Date of Birth: Jun 21, 1970  Today's Date: 07/08/2020 PT Missed Time: 33 Minutes Missed Time Reason: Patient ill (Comment);Patient unwilling to participate  Pt received supine in bed resting. Upon therapist entry pt states he is feeling "terrible" with continued nausea/vomitting and had emesis this AM. Pt requests to rest at this time declining participation in therapy session. Left supine in bed with needs in reach. Missed 30 minutes of skilled physical therapy.  Tawana Scale , PT, DPT, CSRS  07/08/2020, 10:43 AM

## 2020-07-08 NOTE — Progress Notes (Signed)
Occupational Therapy Session Note  [name] MRN: [id number] Date of Birth: 1970/08/03  Today's Date: 07/08/2020 OT Individual Time: 1100-1141 OT Individual Time Calculation (min): 41 min    Short Term Goals: Week 3:  OT Short Term Goal 1 (Week 3): STGs=LTGs due to ELOS OT Short Term Goal 1 - Progress (Week 3): Progressing toward goal Week 4:  OT Short Term Goal 1 (Week 4): STGs=LTGs due to ELOS   Skilled Therapeutic Interventions/Progress Updates:    Pt greeted at time of session supine in bed resting continuing to feel fatigue and ill, but agreeable to OT session to attempt tasks. Pt has not been feeling up to doing an ADL recently but agreeable to try today feeling "a little better." Supine > sit Min A for trunk elevation and sat EOB approx 20-25 minutes for UB bathing, dressing, applying lotion, and oral hygiene all with Supervision/CGA. Declined LB bathing today but agreed to change pants, doffed w/ supervisoin sitting EOB and donned pants EOB which is different for the pt but returned to supine to don over hips, Min A overall to thread. Set up with call bell in reach all needs met. Note BP checked in sitting twice, 153/111 first sitting EOB pre activity and 160/101 second check after activity, lightheaded throughout and no ace or binder applied.   Therapy Documentation Precautions:  Precautions Precautions: Fall Precaution Comments: wounds on bilat feet and L sacrum, inconsistent orthostatic BPs Required Braces or Orthoses: Other Brace Other Brace: abdominal binder PRN with standing/strenuous activity Restrictions Weight Bearing Restrictions: No    Therapy/Group: Individual Therapy  [name] 07/08/2020, 12:04 PM

## 2020-07-08 NOTE — Progress Notes (Signed)
Physical Therapy Session Note  [name] MRN: [id number] Date of Birth: 04/11/1971  Today's Date: 07/08/2020 PT Individual Time: [id number] PT Individual Time Calculation (min): 15 min  and Today's Date: 07/08/2020 PT Missed Time: 87 Minutes Missed Time Reason: Patient ill (Comment);Patient unwilling to participate  Short Term Goals: Week 5:  PT Short Term Goal 1 (Week 5): pt to demonstrate to [facility] mobility at 63' CGA PT Short Term Goal 2 (Week 5): pt to demonstrate min A functional transfers consistently PT Short Term Goal 3 (Week 5): pt to demonstrate supervision bed mobility consistently  Skilled Therapeutic Interventions/Progress Updates:     Pt received supine in bed. Agrees to therapy but requests to stay in bed due to upset stomach and fear of diarrhea. Supine to sit with bed features and cues on sequencing and body mechanics. Pt performs therex at EOB, 2x10 LAQs, seated marching, upper extremity raises and abduction. PT encourages pt to perform OOB mobility and educates on importance, but pt refuses at this time due to not feeling well. Sit to supine with supervision and cues for positioning. Pt misses 45 minutes of skilled PT due to fatigue.  Therapy Documentation Precautions:  Precautions Precautions: Fall Precaution Comments: wounds on bilat feet and L sacrum, inconsistent orthostatic BPs Required Braces or Orthoses: Other Brace Other Brace: abdominal binder PRN with standing/strenuous activity Restrictions Weight Bearing Restrictions: No   Therapy/Group: Individual Therapy  Breck Coons, PT, DPT 07/08/2020, 4:23 PM

## 2020-07-08 NOTE — Progress Notes (Signed)
[facility] PHYSICAL MEDICINE & REHABILITATION PROGRESS NOTE   Subjective/Complaints:  Pt reports bowels getting somewhat better-  Abd pain and cramping also a little better- not "fixed" yet.  Has some post nasal drip- think could be adding to cough- but will also check CXR portable to check on possible cause of cough x 1 week- cough drops/meds aren't a little helpful, but keeps coming back.    ROS:   Pt denies SOB, CP, N/V/C/D, and vision changes     Objective:   No results found. Recent Labs    07/05/20 1422 07/05/20 1658 07/06/20 0948 07/06/20 1700 07/07/20 1608 07/08/20 0511  WBC 11.1*  --  10.7*  --   --   --   HGB 10.2*   < > 10.4*   < > 10.5* 10.7*  HCT 33.7*   < > 32.9*   < > 33.8* 33.7*  PLT 336  --  343  --   --   --    < > = values in this interval not displayed.   Recent Labs    07/06/20 1606  NA 135  K 5.1  CL 99  CO2 26  GLUCOSE 96  BUN 60*  CREATININE 6.72*  CALCIUM 9.2    Intake/Output Summary (Last 24 hours) at 07/08/2020 1033 Last data filed at 07/07/2020 1812 Gross per 24 hour  Intake 240 ml  Output -  Net 240 ml     Pressure Injury 05/01/20 Buttocks Left Stage 4 - Full thickness tissue loss with exposed bone, tendon or muscle. packed with santyl gauze, dressed by WOCN, (Active)  05/01/20 0230  Location: Buttocks  Location Orientation: Left  Staging: Stage 4 - Full thickness tissue loss with exposed bone, tendon or muscle.  Wound Description (Comments): packed with santyl gauze, dressed by WOCN,  Present on Admission: Yes     Pressure Injury 05/01/20 Ankle Left;Lateral Unstageable - Full thickness tissue loss in which the base of the injury is covered by slough (yellow, tan, gray, green or brown) and/or eschar (tan, brown or black) in the wound bed. smal circular wound on the (Active)  05/01/20 0230  Location: Ankle  Location Orientation: Left;Lateral  Staging: Unstageable - Full thickness tissue loss in which the base of the injury  is covered by slough (yellow, tan, gray, green or brown) and/or eschar (tan, brown or black) in the wound bed.  Wound Description (Comments): smal circular wound on the bone  Present on Admission: Yes     Pressure Injury 05/01/20 Foot Left;Lateral Unstageable - Full thickness tissue loss in which the base of the injury is covered by slough (yellow, tan, gray, green or brown) and/or eschar (tan, brown or black) in the wound bed. 4th and 5th toes appear nec (Active)  05/01/20 0230  Location: Foot (4th and 5th toes and lateral/anterior side of the sole)  Location Orientation: Left;Lateral  Staging: Unstageable - Full thickness tissue loss in which the base of the injury is covered by slough (yellow, tan, gray, green or brown) and/or eschar (tan, brown or black) in the wound bed.  Wound Description (Comments): 4th and 5th toes appear necrotic circular wound on sole  Present on Admission: Yes     Pressure Injury 05/01/20 Foot Right;Medial;Posterior Unstageable - Full thickness tissue loss in which the base of the injury is covered by slough (yellow, tan, gray, green or brown) and/or eschar (tan, brown or black) in the wound bed. large circular wo (Active)  05/01/20 0230  Location: Foot  Location Orientation: Right;Medial;Posterior  Staging: Unstageable - Full thickness tissue loss in which the base of the injury is covered by slough (yellow, tan, gray, green or brown) and/or eschar (tan, brown or black) in the wound bed.  Wound Description (Comments): large circular wound on sole  and heel  Present on Admission: Yes     Pressure Injury 05/08/20 Heel Right Unstageable - Full thickness tissue loss in which the base of the injury is covered by slough (yellow, tan, gray, green or brown) and/or eschar (tan, brown or black) in the wound bed. (Active)  05/08/20 1718  Location: Heel  Location Orientation: Right  Staging: Unstageable - Full thickness tissue loss in which the base of the injury is covered  by slough (yellow, tan, gray, green or brown) and/or eschar (tan, brown or black) in the wound bed.  Wound Description (Comments):   Present on Admission: Yes    Physical Exam: Vital Signs Blood pressure 136/86, pulse 89, temperature 99.5 F (37.5 C), temperature source Oral, resp. rate 18, height 6' 6"  (1.981 m), weight 119 kg, SpO2 100 %. Constitutional: laying for the first time in awhile slightly on R side in bed- more off sacral ulcer, sleepy, NAD HEENT: EOMI, oral membranes moist Neck: supple Cardiovascular: RRR- no JVD Respiratory/Chest: little cough occ- x2 while in room- CTA B/L- no W/R/R- good air movement GI/Abdomen:  Soft, mild/diffuse TTP- less than yesterday- no rebound, ND, normoactive BS Ext: no clubbing, cyanosis, or edema Psych: calmer today, sleepy Extremities: edema in extremities.  No tenderness in extremities. Neuro: Alert Motor: Bilateral upper extremities: 5/5 proximal distal Bilateral lower extremities: 4-/5 proximal distal   Skin: Multiple foot wounds. L foot- has ruptured blood blister 5th MTP base; also 4th toe is dry gangrene- all black eschar. Large sacral wound- no change Feet wounds look the same- no significant improvement in healing since seen last time-  Assessment/Plan: 1. Functional deficits which require 3+ hours per day of interdisciplinary therapy in a comprehensive inpatient rehab setting.  Physiatrist is providing close team supervision and 24 hour management of active medical problems listed below.  Physiatrist and rehab team continue to assess barriers to discharge/monitor patient progress toward functional and medical goals  Care Tool:  Bathing    Body parts bathed by patient: Right arm,Left arm,Chest,Abdomen,Right upper leg,Left upper leg,Face,Right lower leg,Left lower leg   Body parts bathed by helper: Front perineal area,Buttocks     Bathing assist Assist Level: Minimal Assistance - Patient > 75%     Upper Body  Dressing/Undressing Upper body dressing   What is the patient wearing?: Pull over shirt    Upper body assist Assist Level: Set up assist    Lower Body Dressing/Undressing Lower body dressing      What is the patient wearing?: Pants     Lower body assist Assist for lower body dressing: Minimal Assistance - Patient > 75%     Toileting Toileting    Toileting assist Assist for toileting: Minimal Assistance - Patient > 75% Assistive Device Comment:  (urinal)   Transfers Chair/bed transfer  Transfers assist  Chair/bed transfer activity did not occur: Safety/medical concerns (Per nursing report)  Chair/bed transfer assist level: Minimal Assistance - Patient > 75% Chair/bed transfer assistive device: Sliding board,Armrests   Locomotion Ambulation   Ambulation assist   Ambulation activity did not occur: Safety/medical concerns (fatigue, lightheadedness, generalized weakness)  Assist level: Minimal Assistance - Patient > 75% Assistive device: Parallel bars Max distance: 24 ft   Walk  10 feet activity   Assist  Walk 10 feet activity did not occur: Safety/medical concerns (fatigue, lightheadedness, generalized weakness)  Assist level: Minimal Assistance - Patient > 75% Assistive device: Parallel bars   Walk 50 feet activity   Assist Walk 50 feet with 2 turns activity did not occur: Safety/medical concerns (fatigue, lightheadedness, generalized weakness)         Walk 150 feet activity   Assist Walk 150 feet activity did not occur: Safety/medical concerns (fatigue, lightheadedness, generalized weakness)         Walk 10 feet on uneven surface  activity   Assist Walk 10 feet on uneven surfaces activity did not occur: Safety/medical concerns (fatigue, lightheadedness, generalized weakness)         Wheelchair     Assist Will patient use wheelchair at discharge?: Yes Type of Wheelchair: Manual    Wheelchair assist level: Supervision/Verbal  cueing Max wheelchair distance: 100    Wheelchair 50 feet with 2 turns activity    Assist        Assist Level: Supervision/Verbal cueing   Wheelchair 150 feet activity     Assist      Assist Level: Minimal Assistance - Patient > 75%   Blood pressure 136/86, pulse 89, temperature 99.5 F (37.5 C), temperature source Oral, resp. rate 18, height 6' 6"  (1.981 m), weight 119 kg, SpO2 100 %.  Medical Problem List and Plan: 1.  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  Antithrombotics: -DVT/anticoagulation: Subcutaneous heparin.               -antiplatelet therapy: N/A 3. Pain Management: Tylenol as needed  2/11- denied pain- con't regimen  2/16- having abdominal pain but controlled with tylenol 4. Mood: Provide emotional support             -antipsychotic agents: N/A 5. Neuropsych: This patient is? capable of making decisions on his own behalf. 6. Skin/Wound Care: Routine skin checks 7. Fluids/Electrolytes/Nutrition: Routine in and outs with follow-up chemistries 8.  ESRD.  Hemodialysis per renal services 9.  Sacral decubitus ulcer as well as ischemic ulcers on the left foot fourth toe and fifth metatarsal head and right heel and fifth metatarsal head.  Wound care nurse follow-up.  Patient to see Dr. Sharol Given outpatient.  Continue bilateral PRAFO's. 10.  ID/ coccygeal osteomyelitis.    Completed vancomycin and cefepime on 06/10/2020  1/18- will schedule turning q2 hours to get off wounds- also will make sure on specialty bed  1/20- is on specialty bed  Repeat labs ordered for Monday  2/7- CRP 6.5- ESR pending - con't regimen  2/8- ESR  DOWN TO 67 FROM 73- SO HEADING IN RIGHT DIRECTION  2/9- Spiked temp last night of 100.9- restarted Vanc/Cefipime- pt refused Vanc "since causes diarrhea". WBC now normal again this AM- will con't for now. CXR (-); is anuric-  2/10- having fevers/sweats/chills- Tm 99.4- needs to also take Vanc-  told this to pt- also, called ID and also ordered another CT of pelvis due to Sx's-    2/13 ID saw pt Friday. Thinks that low grade temps are coming from HD cath. Was not impressed by MRI of pelvis. Recommended stopping abx (d/t loose stool)   -continue to monitor   -continue [facility] per nephrology   -afebrile last 36 hours  2/14- No fevers even low grafde x 48+ hours- con't off ABX.  2/15- Won't turn- always on backside in bed.   10.  Anemia of  chronic disease.  Continue Aranesp.   Hemoglobin 10.0 on 2/3, labs with HD 11.  Diabetes mellitus with peripheral neuropathy .  Hemoglobin A1c 5.6.  Lantus insulin 11 units nightly  CBG (last 3)  Recent Labs    07/07/20 1649 07/07/20 2117 07/08/20 0523  GLUCAP 100* 121* 86    2/17- BGs 86-121- con't regimen 12.  Morbid obesity.  BMI 38.37.  Dietary follow-up 13. Dizziness/nausea with HTN and hypotension  Vitals:   07/07/20 2046 07/08/20 0512  BP: (!) 160/99 136/86  Pulse: (!) 106 89  Resp: 18 18  Temp: 99.8 F (37.7 C) 99.5 F (37.5 C)  SpO2: 99% 100%    Hydralazine prn   Norvasc to 7.5 mg daily   2/9- BP 156/85 this AM- but has been controlled last few days  2/12-13-bps still with some fluctuation but not severe. Continue current meds, volume mgt per nephrology  2/15- BP's 150s-160s/90s- on Norvasc 7.5- can increase, but usually has orthostatic hypotension when we do. Will d/w Renal  2/16- "passed out" yesterday= will call renal   2/17- renal note shows they will not pull off fluid today in HD due to "passing out event" and diarrhea fluid losses 14. Diarrhea with new constipation  Bowel meds increased on 2/5  2/7- will decrease bowel meds again since 8+ BMs in last 24 hours- con't regimen  2/12 stool still mushy. Stopped miralax  2/14- will allow them to use the Imodium prn that's ordered- since still having so many stools- he does this from IV ABX 15. Leukocytosis: Resolved  ESR/CRP and CBC with diff ordered for Monday.  2/7- CRP  6.5- stable- con't regimen   2/8- EST 67- down to 73- con't regimen  2/11- Labs looking slightly better, but imaging does not- waiting to hear from ID  2/14- per ID, pelvis osteomyelitis not the issue- off ABX per their request 16. N/Vomiting-  2/10- likely due to underlying illness- will do ID w/u as above.  17. 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   2/16- abdominal pain slightly better  2/17- BMs less frequent per pt and less abd pain- con't regimen- will recheck labs  16. Dispo  2/8- pt unhappy that has to back to SNF- however has been participating intermittently- finally doing more/more interactive, but we can only keep max of 30 days. Sent FL2  2/9- pt refused PT yesterday -only bedlevel Exercises- pt needs max encouragement to progress with therapy   2/10- might have to hold d/c- we will see.  2/11- might not be able to d/c Monday per SNF search/SW   2/15- told SW that cannot d/c right now due to C Diff.    LOS: 32 days A FACE TO FACE EVALUATION WAS PERFORMED  Lusero Nordlund 07/08/2020, 10:33 AM

## 2020-07-08 NOTE — Progress Notes (Signed)
Finally got an answer from [facility] and rehab regarding bed offer. They have a strict policy regarding patient's need to be COVID vaccinated prior to admission or 14 days out from vaccination. Pt is not vaccinated currently was asked when he was admitted and he declined. Have also contacted [facility] and they are not currently taking admissions and not sure when will be able too. Contacted [name]-sister and pt to inform will expand bed search and will not be able to go to [facility]. Awaiting sister's return call. Will expand search.

## 2020-07-09 LAB — GLUCOSE, CAPILLARY
Glucose-Capillary: 86 mg/dL (ref 70–99)
Glucose-Capillary: 111 mg/dL — ABNORMAL HIGH (ref 70–99)
Glucose-Capillary: 116 mg/dL — ABNORMAL HIGH (ref 70–99)
Glucose-Capillary: 113 mg/dL — ABNORMAL HIGH (ref 70–99)

## 2020-07-09 LAB — CBC WITH DIFFERENTIAL/PLATELET
Abs Immature Granulocytes: 0.11 10*3/uL — ABNORMAL HIGH (ref 0.00–0.07)
Basophils Absolute: 0 10*3/uL (ref 0.0–0.1)
Basophils Relative: 1 %
Eosinophils Absolute: 0.1 10*3/uL (ref 0.0–0.5)
Eosinophils Relative: 2 %
HCT: 33.8 % — ABNORMAL LOW (ref 39.0–52.0)
Hemoglobin: 10.2 g/dL — ABNORMAL LOW (ref 13.0–17.0)
Immature Granulocytes: 1 %
Lymphocytes Relative: 20 %
Lymphs Abs: 1.8 10*3/uL (ref 0.7–4.0)
MCH: 25.1 pg — ABNORMAL LOW (ref 26.0–34.0)
MCHC: 30.2 g/dL (ref 30.0–36.0)
MCV: 83 fL (ref 80.0–100.0)
Monocytes Absolute: 1.3 10*3/uL — ABNORMAL HIGH (ref 0.1–1.0)
Monocytes Relative: 16 %
Neutro Abs: 5.3 10*3/uL (ref 1.7–7.7)
Neutrophils Relative %: 60 %
Platelets: 292 10*3/uL (ref 150–400)
RBC: 4.07 MIL/uL — ABNORMAL LOW (ref 4.22–5.81)
RDW: 16.1 % — ABNORMAL HIGH (ref 11.5–15.5)
WBC: 8.7 10*3/uL (ref 4.0–10.5)
nRBC: 0 % (ref 0.0–0.2)

## 2020-07-09 LAB — HEMOGLOBIN AND HEMATOCRIT, BLOOD
HCT: 35.9 % — ABNORMAL LOW (ref 39.0–52.0)
Hemoglobin: 11 g/dL — ABNORMAL LOW (ref 13.0–17.0)

## 2020-07-09 NOTE — Progress Notes (Signed)
[facility] PHYSICAL MEDICINE & REHABILITATION PROGRESS NOTE   Subjective/Complaints:  Pt reports cough is about the same so far- now has Flonase and cough drops. Sounded less so far this AM to interviewer.   Pt also reports only 1 BM yesterday- was watery.   ROS:   Pt denies SOB, abd pain, CP, N/V/C/D, and vision changes     Objective:   DG CHEST PORT 1 VIEW  Result Date: 07/08/2020 CLINICAL DATA:  Congestion and cough for 4 days. EXAM: PORTABLE CHEST 1 VIEW COMPARISON:  PA and lateral chest 06/29/2020. FINDINGS: Right IJ approach dialysis catheter is unchanged. Lungs are clear. Heart size is normal. No pneumothorax or pleural fluid. No acute or focal bony abnormality. IMPRESSION: No acute disease. Electronically Signed   By: Inge Rise M.D.   On: 07/08/2020 11:02   Recent Labs    07/08/20 1322 07/09/20 0509  WBC 8.3 8.7  HGB 9.9* 10.2*  HCT 32.1* 33.8*  PLT 295 292   Recent Labs    07/06/20 1606 07/08/20 1322  NA 135 132*  K 5.1 5.1  CL 99 98  CO2 26 25  GLUCOSE 96 138*  BUN 60* 47*  CREATININE 6.72* 6.55*  CALCIUM 9.2 9.1    Intake/Output Summary (Last 24 hours) at 07/09/2020 0981 Last data filed at 07/08/2020 1630 Gross per 24 hour  Intake 220 ml  Output 0 ml  Net 220 ml     Pressure Injury 05/01/20 Buttocks Left Stage 4 - Full thickness tissue loss with exposed bone, tendon or muscle. packed with santyl gauze, dressed by WOCN, (Active)  05/01/20 0230  Location: Buttocks  Location Orientation: Left  Staging: Stage 4 - Full thickness tissue loss with exposed bone, tendon or muscle.  Wound Description (Comments): packed with santyl gauze, dressed by WOCN,  Present on Admission: Yes     Pressure Injury 05/01/20 Ankle Left;Lateral Unstageable - Full thickness tissue loss in which the base of the injury is covered by slough (yellow, tan, gray, green or brown) and/or eschar (tan, brown or black) in the wound bed. smal circular wound on the (Active)   05/01/20 0230  Location: Ankle  Location Orientation: Left;Lateral  Staging: Unstageable - Full thickness tissue loss in which the base of the injury is covered by slough (yellow, tan, gray, green or brown) and/or eschar (tan, brown or black) in the wound bed.  Wound Description (Comments): smal circular wound on the bone  Present on Admission: Yes     Pressure Injury 05/01/20 Foot Left;Lateral Unstageable - Full thickness tissue loss in which the base of the injury is covered by slough (yellow, tan, gray, green or brown) and/or eschar (tan, brown or black) in the wound bed. 4th and 5th toes appear nec (Active)  05/01/20 0230  Location: Foot (4th and 5th toes and lateral/anterior side of the sole)  Location Orientation: Left;Lateral  Staging: Unstageable - Full thickness tissue loss in which the base of the injury is covered by slough (yellow, tan, gray, green or brown) and/or eschar (tan, brown or black) in the wound bed.  Wound Description (Comments): 4th and 5th toes appear necrotic circular wound on sole  Present on Admission: Yes     Pressure Injury 05/01/20 Foot Right;Medial;Posterior Unstageable - Full thickness tissue loss in which the base of the injury is covered by slough (yellow, tan, gray, green or brown) and/or eschar (tan, brown or black) in the wound bed. large circular wo (Active)  05/01/20 0230  Location: Foot  Location Orientation: Right;Medial;Posterior  Staging: Unstageable - Full thickness tissue loss in which the base of the injury is covered by slough (yellow, tan, gray, green or brown) and/or eschar (tan, brown or black) in the wound bed.  Wound Description (Comments): large circular wound on sole  and heel  Present on Admission: Yes     Pressure Injury 05/08/20 Heel Right Unstageable - Full thickness tissue loss in which the base of the injury is covered by slough (yellow, tan, gray, green or brown) and/or eschar (tan, brown or black) in the wound bed. (Active)   05/08/20 1718  Location: Heel  Location Orientation: Right  Staging: Unstageable - Full thickness tissue loss in which the base of the injury is covered by slough (yellow, tan, gray, green or brown) and/or eschar (tan, brown or black) in the wound bed.  Wound Description (Comments):   Present on Admission: Yes    Physical Exam: Vital Signs Blood pressure 130/77, pulse 96, temperature 100.2 F (37.9 C), temperature source Oral, resp. rate 18, height _0  (1.981 m), weight 115.5 kg, SpO2 97 %. Constitutional: asleep, woke to stimuli; supine in bed; NAD HEENT: EOMI, oral membranes moist Neck: supple Cardiovascular: RRR- no JVD Respiratory/Chest: little cough x1- not deep/nonproductive; sounds CTA B/L- no W/R/R- decreased slightly at bases GI/Abdomen:  Soft, ND, mildly TTP diffusely; hypoactive BS Ext: no clubbing, cyanosis, or edema Psych: sleepy Extremities: edema in extremities.  No tenderness in extremities. Neuro: Alert usually, but sleepy this AM Motor: Bilateral upper extremities: 5/5 proximal distal Bilateral lower extremities: 4-/5 proximal distal   Skin: Multiple foot wounds. L foot- has ruptured blood blister 5th MTP base; also 4th toe is dry gangrene- all black eschar. Large sacral wound- no change Feet wounds look the same- no significant improvement in healing since seen last time-  Assessment/Plan: 1. Functional deficits which require 3+ hours per day of interdisciplinary therapy in a comprehensive inpatient rehab setting.  Physiatrist is providing close team supervision and 24 hour management of active medical problems listed below.  Physiatrist and rehab team continue to assess barriers to discharge/monitor patient progress toward functional and medical goals  Care Tool:  Bathing    Body parts bathed by patient: Right arm,Left arm,Chest,Abdomen,Face   Body parts bathed by helper: Front perineal area,Buttocks     Bathing assist Assist Level: Contact  Guard/Touching assist     Upper Body Dressing/Undressing Upper body dressing   What is the patient wearing?: Pull over shirt    Upper body assist Assist Level: Supervision/Verbal cueing    Lower Body Dressing/Undressing Lower body dressing      What is the patient wearing?: Pants     Lower body assist Assist for lower body dressing: Minimal Assistance - Patient > 75%     Toileting Toileting    Toileting assist Assist for toileting: Minimal Assistance - Patient > 75% Assistive Device Comment:  (urinal)   Transfers Chair/bed transfer  Transfers assist  Chair/bed transfer activity did not occur: Safety/medical concerns (Per nursing report)  Chair/bed transfer assist level: Minimal Assistance - Patient > 75% Chair/bed transfer assistive device: Sliding board,Armrests   Locomotion Ambulation   Ambulation assist   Ambulation activity did not occur: Safety/medical concerns (fatigue, lightheadedness, generalized weakness)  Assist level: Minimal Assistance - Patient > 75% Assistive device: Parallel bars Max distance: 24 ft   Walk 10 feet activity   Assist  Walk 10 feet activity did not occur: Safety/medical concerns (fatigue, lightheadedness, generalized weakness)  Assist level: Minimal  Assistance - Patient > 75% Assistive device: Parallel bars   Walk 50 feet activity   Assist Walk 50 feet with 2 turns activity did not occur: Safety/medical concerns (fatigue, lightheadedness, generalized weakness)         Walk 150 feet activity   Assist Walk 150 feet activity did not occur: Safety/medical concerns (fatigue, lightheadedness, generalized weakness)         Walk 10 feet on uneven surface  activity   Assist Walk 10 feet on uneven surfaces activity did not occur: Safety/medical concerns (fatigue, lightheadedness, generalized weakness)         Wheelchair     Assist Will patient use wheelchair at discharge?: Yes Type of Wheelchair: Manual     Wheelchair assist level: Supervision/Verbal cueing Max wheelchair distance: 100    Wheelchair 50 feet with 2 turns activity    Assist        Assist Level: Supervision/Verbal cueing   Wheelchair 150 feet activity     Assist      Assist Level: Minimal Assistance - Patient > 75%   Blood pressure 130/77, pulse 96, temperature 100.2 F (37.9 C), temperature source Oral, resp. rate 18, height _0  (1.981 m), weight 115.5 kg, SpO2 97 %.  Medical Problem List and Plan: 1.  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  Antithrombotics: -DVT/anticoagulation: Subcutaneous heparin.               -antiplatelet therapy: N/A 3. Pain Management: Tylenol as needed  2/11- denied pain- con't regimen  2/16- having abdominal pain but controlled with tylenol 4. Mood: Provide emotional support             -antipsychotic agents: N/A 5. Neuropsych: This patient is? capable of making decisions on his own behalf. 6. Skin/Wound Care: Routine skin checks 7. Fluids/Electrolytes/Nutrition: Routine in and outs with follow-up chemistries 8.  ESRD.  Hemodialysis per renal services 9.  Sacral decubitus ulcer as well as ischemic ulcers on the left foot fourth toe and fifth metatarsal head and right heel and fifth metatarsal head.  Wound care nurse follow-up.  Patient to see Dr. Sharol Given outpatient.  Continue bilateral PRAFO's. 10.  ID/ coccygeal osteomyelitis.    Completed vancomycin and cefepime on 06/10/2020  1/18- will schedule turning q2 hours to get off wounds- also will make sure on specialty bed  2/7- CRP 6.5- ESR pending - con't regimen  2/8- ESR  DOWN TO 67 FROM 73- SO HEADING IN RIGHT DIRECTION  2/9- Spiked temp last night of 100.9- restarted Vanc/Cefipime- pt refused Vanc "since causes diarrhea". WBC now normal again this AM- will con't for now. CXR (-); is anuric-  2/10- having fevers/sweats/chills- Tm 99.4- needs to also  take Vanc- told this to pt- also, called ID and also ordered another CT of pelvis due to Sx's-    2/13 ID saw pt Friday. Thinks that low grade temps are coming from HD cath. Was not impressed by MRI of pelvis. Recommended stopping abx (d/t loose stool)   -continue to monitor   -continue [facility] per nephrology   -afebrile last 36 hours  2/18- won't stay off backside- is not healing - even with encouragement, not turning off backside  10.  Anemia of chronic disease.  Continue Aranesp.   Hemoglobin 10.0 on 2/3, labs with HD 11.  Diabetes mellitus with peripheral neuropathy .  Hemoglobin A1c 5.6.  Lantus insulin 11 units nightly  CBG (last 3)  Recent  Labs    07/08/20 1707 07/08/20 2122 07/09/20 0633  GLUCAP 87 141* 86    2/18- BGs 86-141- con't regimen 12.  Morbid obesity.  BMI 38.37.  Dietary follow-up 13. Dizziness/nausea with HTN and hypotension  Vitals:   07/08/20 2050 07/09/20 0520  BP: (!) 164/95 130/77  Pulse: 100 96  Resp: 18 18  Temp: (!) 100.5 F (38.1 C) 100.2 F (37.9 C)  SpO2:  97%    Hydralazine prn   Norvasc to 7.5 mg daily   2/9- BP 156/85 this AM- but has been controlled last few days  2/12-13-bps still with some fluctuation but not severe. Continue current meds, volume mgt per nephrology  2/15- BP's 150s-160s/90s- on Norvasc 7.5- can increase, but usually has orthostatic hypotension when we do. Will d/w Renal  2/16- "passed out" yesterday= will call renal   2/17- renal note shows they will not pull off fluid today in HD due to "passing out event" and diarrhea fluid losses  2/18- BP up to 164/95 last night-con't regimen per renal 14. Diarrhea with new constipation  Bowel meds increased on 2/5  2/7- will decrease bowel meds again since 8+ BMs in last 24 hours- con't regimen  2/12 stool still mushy. Stopped miralax  2/14- will allow them to use the Imodium prn that's ordered- since still having so many stools- he does this from IV ABX  2/18- has C diff- off  Imodium 15. Leukocytosis: Resolved  ESR/CRP and CBC with diff ordered for Monday.  2/7- CRP 6.5- stable- con't regimen   2/8- EST 67- down to 73- con't regimen  2/11- Labs looking slightly better, but imaging does not- waiting to hear from ID  2/14- per ID, pelvis osteomyelitis not the issue- off ABX per their request 16. N/Vomiting-  2/10- likely due to underlying illness- will do ID w/u as above.  17. 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   2/16- abdominal pain slightly better  2/17- BMs less frequent per pt and less abd pain- con't regimen- will recheck labs   2/18- WBC down to 8.7- no Left shift- 1 watery BM today- is doing better- except had Tm 100.5 overnight- if spikes again, might need to call ID back.   16. Dispo  2/8- pt unhappy that has to back to SNF- however has been participating intermittently- finally doing more/more interactive, but we can only keep max of 30 days. Sent FL2  2/9- pt refused PT yesterday -only bedlevel Exercises- pt needs max encouragement to progress with therapy   2/10- might have to hold d/c- we will see.  2/11- might not be able to d/c Monday per SNF search/SW   2/15- told SW that cannot d/c right now due to C Diff.    LOS: 33 days A FACE TO FACE EVALUATION WAS PERFORMED  Ebbie Cherry 07/09/2020, 8:23 AM

## 2020-07-09 NOTE — Progress Notes (Addendum)
Patient ID: Fred [name], male   DOB: [birth year], 50 y.o.   MRN: [id number]  Have expanded bed search and have contacted numerous facilities located in [place], [place], [place], [place], [employer] and [employer]. Sent faxes and spoke with numerous admission coordinators. Pt asked if got vaccine could he go to [facility], informed him they would make him wait 14 days after vaccine and beds are tight for them. Also reached out to [name] where pt was at prior to admission and spoke with administrator to see if they would take him back. He reported need to speak with admissions-[name] on Monday to confirm if would have a bed for him. Made pt aware of all of this. He just wants to feel better and is tired of feeling this way. Will update sister also.  1;21 PM Updated sister on awaiting responses from facilities faxed information to. She has medical questions have sent a text to MD to please call her.

## 2020-07-09 NOTE — Progress Notes (Signed)
Physical Therapy Session Note  [name] MRN: [id number] Date of Birth: 03-18-1971  Today's Date: 07/09/2020 PT Individual Time: 1000-1045 PT Individual Time Calculation (min): 45 min   Short Term Goals: Week 5:  PT Short Term Goal 1 (Week 5): pt to demonstrate to [name] mobility at 75' CGA PT Short Term Goal 2 (Week 5): pt to demonstrate min A functional transfers consistently PT Short Term Goal 3 (Week 5): pt to demonstrate supervision bed mobility consistently  Skilled Therapeutic Interventions/Progress Updates:    pt received in bed and agreeable to therapy however reported "I won't promise anything", as he continues to be nauseous, vomiting, have stomach pain 8/10, and continued diarrhea. Throughout session pt expressed feeling "down" about current medical needs, inability to return home and need of facility placement 2/2 functional status. Pt encouraged to participate and focus on improvements since here however limited success of improvement. Pt found in bed with pants down and requested assistance to pull pants over hips however with attempting pt reported he had, had a BM and required max A for doffing soiled brief, total A for pericare, max A for donning clean brief, and min A for pants, all at bed level. Pt then directed in supine>sit SBA and agreeable to attempt sitting EOB to eat breakfast, pt encouraged to reach outside BOS for meal on tray table, sit with unsupported trunk and BLE and no UE support, SBA, with reaching in forward, forward/R and forward/L. Pt directed in seated BLE strengthening exercises of LAQ with VC for full range of motion for optimal strength gains, 2x15, marches, hip abduction/adduction, ankle DF/PF, 2x10 3# arm bar of bicep curls, chest press and overhead press. Pt sat EOB with eyes closed most of session and reported with eyes open, nausea and stomach cramping worsened. Pt reported greatly increased nausea post breakfast and requested to  return to supine. Pt very apologetic and reported he wanted to participate but unable to tolerate sitting upright and longer. Pt directed in sit>supine SBA and SBA for all repositioning. Pt left in bed, All needs in reach and in good condition. Call light in hand.  Nursing made aware of pt's pain and nausea.   Therapy Documentation Precautions:  Precautions Precautions: Fall Precaution Comments: wounds on bilat feet and L sacrum, inconsistent orthostatic BPs Required Braces or Orthoses: Other Brace Other Brace: abdominal binder PRN with standing/strenuous activity Restrictions Weight Bearing Restrictions: No General: PT Amount of Missed Time (min): 15 Minutes PT Missed Treatment Reason: Patient ill (Comment);Patient unwilling to participate Vital Signs:   Pain:   Mobility:   Locomotion :    Trunk/Postural Assessment :    Balance:   Exercises:   Other Treatments:      Therapy/Group: Individual Therapy  [name] 07/09/2020, 12:31 PM

## 2020-07-09 NOTE — Progress Notes (Signed)
[name] Progress Note   Subjective:  Dialyzed yesterday even UF. No issues on dialysis Seen in room. Tired, some loose stools.   Objective Vitals:   07/08/20 1630 07/08/20 1711 07/08/20 2050 07/09/20 0520  BP: (!) 143/85 (!) 138/95 (!) 164/95 130/77  Pulse: 94 (!) 108 100 96  Resp: '16 18 18 18  '$ Temp: 98.1 F (36.7 C) 99.7 F (37.6 C) (!) 100.5 F (38.1 C) 100.2 F (37.9 C)  TempSrc: Oral Oral Oral Oral  SpO2: 97% 99%  97%  Weight: 115.5 kg     Height:         Additional Objective Labs: Basic Metabolic Panel: Recent Labs  Lab 07/05/20 0555 07/06/20 1606 07/08/20 1322  NA 135 135 132*  K 4.3 5.1 5.1  CL 99 99 98  CO2 '26 26 25  '$ GLUCOSE 113* 96 138*  BUN 47* 60* 47*  CREATININE 5.46* 6.72* 6.55*  CALCIUM 9.1 9.2 9.1  PHOS 3.9 4.7* 4.4   CBC: Recent Labs  Lab 07/05/20 0555 07/05/20 1422 07/05/20 1658 07/06/20 0948 07/06/20 1700 07/08/20 0511 07/08/20 1322 07/09/20 0509  WBC 10.2 11.1*  --  10.7*  --   --  8.3 8.7  NEUTROABS 6.5  --   --  6.9  --   --   --  5.3  HGB 10.0* 10.2*   < > 10.4*   < > 10.7* 9.9* 10.2*  HCT 33.3* 33.7*   < > 32.9*   < > 33.7* 32.1* 33.8*  MCV 83.9 85.5  --  83.1  --   --  82.9 83.0  PLT 300 336  --  343  --   --  295 292   < > = values in this interval not displayed.   Blood Culture    Component Value Date/Time   SDES SACRAL 06/30/2020 0013   SPECREQUEST NONE 06/30/2020 0013   CULT MULTIPLE ORGANISMS PRESENT, NONE PREDOMINANT (A) 06/30/2020 0013   REPTSTATUS 07/02/2020 FINAL 06/30/2020 0013     Physical Exam General: Large man, lying in bed, nad  Heart: RRR Lungs: Clear anteriorly  Abdomen: soft non-tender  Extremities: No sig LE edema  Dialysis Access: R IJ TDC   Medications: . iron sucrose 100 mg (07/08/20 1532)   . amLODipine  5 mg Oral QHS  . Chlorhexidine Gluconate Cloth  6 each Topical BID  . Chlorhexidine Gluconate Cloth  6 each Topical Q0600  . collagenase   Topical Daily  .  darbepoetin (ARANESP) injection - DIALYSIS  200 mcg Intravenous Q Tue-HD  . feeding supplement (NEPRO CARB STEADY)  237 mL Oral TID BM  . fluticasone  2 spray Each Nare Daily  . heparin injection (subcutaneous)  7,500 Units Subcutaneous Q8H  . insulin aspart  0-9 Units Subcutaneous TID WC  . insulin glargine  11 Units Subcutaneous QHS  . lanthanum  500 mg Oral TID WC  . multivitamin  1 tablet Oral QHS  . pantoprazole  40 mg Oral Q0600  . pneumococcal 23 valent vaccine  0.5 mL Intramuscular Tomorrow-1000  . vancomycin  125 mg Oral QID    Dialysis Orders:  NWTTS 4h 400/800 143.5kg 3K/2.5Ca TDCHeparin3000units IV TIW - Mircera155mgIVq2wks - last 12/8 - Venofer '100mg'$ IVqHD x10 - completed 6/10 doses  Assessment/Plan: 1. History of prolonged hospitalization with COVID-19 and consequent debilitation/sacrococcygeal decubitus/osteomyelitis: Status post completion of intravenous antibiotics and ongoing inpatient rehabilitation. 2. Clostridium difficile associated diarrhea: on oral vancomycin with continued diarrhea and occasional nauseas. PRN imodium and  zofran/compazine noted.   3. ESRD: HD TTS. Continue on schedule.  Next HD 2/19.  4. HTN/volume:  BP controlled. On amlodipne 5. No gross volume excess on exam. Minimal UF on HD.  5. Anemia: Hgb stable. On Aranesp 200 q Tuesday.  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 Continue Fosrenol binder.   7. Nutrition: Renal diet with fluid restriction. Cont prot supp for low albumin.   Fred Child PA-C [facility] 07/09/2020,9:47 AM

## 2020-07-10 LAB — HEMOGLOBIN AND HEMATOCRIT, BLOOD
HCT: 35.7 % — ABNORMAL LOW (ref 39.0–52.0)
HCT: 35.8 % — ABNORMAL LOW (ref 39.0–52.0)
Hemoglobin: 10.6 g/dL — ABNORMAL LOW (ref 13.0–17.0)
Hemoglobin: 10.9 g/dL — ABNORMAL LOW (ref 13.0–17.0)

## 2020-07-10 LAB — RENAL FUNCTION PANEL
Albumin: 1.9 g/dL — ABNORMAL LOW (ref 3.5–5.0)
Anion gap: 8 (ref 5–15)
BUN: 29 mg/dL — ABNORMAL HIGH (ref 6–20)
CO2: 27 mmol/L (ref 22–32)
Calcium: 9.1 mg/dL (ref 8.9–10.3)
Chloride: 99 mmol/L (ref 98–111)
Creatinine, Ser: 5.75 mg/dL — ABNORMAL HIGH (ref 0.61–1.24)
GFR, Estimated: 11 mL/min — ABNORMAL LOW (ref >60)
Glucose, Bld: 103 mg/dL — ABNORMAL HIGH (ref 70–99)
Phosphorus: 4.2 mg/dL (ref 2.5–4.6)
Potassium: 4.6 mmol/L (ref 3.5–5.1)
Sodium: 134 mmol/L — ABNORMAL LOW (ref 135–145)

## 2020-07-10 LAB — GLUCOSE, CAPILLARY
Glucose-Capillary: 103 mg/dL — ABNORMAL HIGH (ref 70–99)
Glucose-Capillary: 95 mg/dL (ref 70–99)
Glucose-Capillary: 84 mg/dL (ref 70–99)
Glucose-Capillary: 116 mg/dL — ABNORMAL HIGH (ref 70–99)

## 2020-07-10 LAB — CBC
HCT: 33.2 % — ABNORMAL LOW (ref 39.0–52.0)
Hemoglobin: 10 g/dL — ABNORMAL LOW (ref 13.0–17.0)
MCH: 25.3 pg — ABNORMAL LOW (ref 26.0–34.0)
MCHC: 30.1 g/dL (ref 30.0–36.0)
MCV: 83.8 fL (ref 80.0–100.0)
Platelets: 327 10*3/uL (ref 150–400)
RBC: 3.96 MIL/uL — ABNORMAL LOW (ref 4.22–5.81)
RDW: 16 % — ABNORMAL HIGH (ref 11.5–15.5)
WBC: 9.1 10*3/uL (ref 4.0–10.5)
nRBC: 0 % (ref 0.0–0.2)

## 2020-07-10 MED ORDER — HEPARIN SODIUM (PORCINE) 1000 UNIT/ML DIALYSIS
1000.0000 [IU] | INTRAMUSCULAR | Status: DC | PRN
  Filled 2020-07-10: qty 1

## 2020-07-10 MED ORDER — LIDOCAINE HCL (PF) 1 % IJ SOLN: 5.0000 mL | INTRAMUSCULAR | Status: DC | PRN

## 2020-07-10 MED ORDER — HEPARIN SODIUM (PORCINE) 1000 UNIT/ML IJ SOLN
INTRAMUSCULAR | Status: AC
  Filled 2020-07-10: qty 4

## 2020-07-10 MED ORDER — ALTEPLASE 2 MG IJ SOLR
2.0000 mg | Freq: Once | INTRAMUSCULAR | Status: DC | PRN
  Filled 2020-07-10: qty 2

## 2020-07-10 MED ORDER — LIDOCAINE-PRILOCAINE 2.5-2.5 % EX CREA
1.0000 "application " | TOPICAL_CREAM | CUTANEOUS | Status: DC | PRN
  Filled 2020-07-10: qty 5

## 2020-07-10 MED ORDER — SODIUM CHLORIDE 0.9 % IV SOLN: 100.0000 mL | INTRAVENOUS | Status: DC | PRN

## 2020-07-10 MED ORDER — PENTAFLUOROPROP-TETRAFLUOROETH EX AERO: 1.0000 "application " | INHALATION_SPRAY | CUTANEOUS | Status: DC | PRN

## 2020-07-10 MED ORDER — HEPARIN SODIUM (PORCINE) 1000 UNIT/ML DIALYSIS
3000.0000 [IU] | Freq: Once | INTRAMUSCULAR | Status: DC
  Filled 2020-07-10: qty 3

## 2020-07-10 NOTE — Progress Notes (Signed)
[facility] Progress Note   Subjective:  Seen at bedside. No new events. Continued nausea/diarrhea.  For dialysis today.   Objective Vitals:   07/09/20 0520 07/09/20 1414 07/09/20 1921 07/10/20 0406  BP: 130/77 138/87 (!) 148/101 137/77  Pulse: 96 95 97 96  Resp: '18 13 17 16  '$ Temp: 100.2 F (37.9 C) 99.7 F (37.6 C) 99.6 F (37.6 C) 98.5 F (36.9 C)  TempSrc: Oral Oral Oral Oral  SpO2: 97% 100% 98% 96%  Weight:      Height:         Additional Objective Labs: Basic Metabolic Panel: Recent Labs  Lab 07/05/20 0555 07/06/20 1606 07/08/20 1322  NA 135 135 132*  K 4.3 5.1 5.1  CL 99 99 98  CO2 '26 26 25  '$ GLUCOSE 113* 96 138*  BUN 47* 60* 47*  CREATININE 5.46* 6.72* 6.55*  CALCIUM 9.1 9.2 9.1  PHOS 3.9 4.7* 4.4   CBC: Recent Labs  Lab 07/05/20 0555 07/05/20 1422 07/05/20 1658 07/06/20 0948 07/06/20 1700 07/08/20 1322 07/09/20 0509 07/09/20 1656 07/10/20 0645  WBC 10.2 11.1*  --  10.7*  --  8.3 8.7  --   --   NEUTROABS 6.5  --   --  6.9  --   --  5.3  --   --   HGB 10.0* 10.2*   < > 10.4*   < > 9.9* 10.2* 11.0* 10.6*  HCT 33.3* 33.7*   < > 32.9*   < > 32.1* 33.8* 35.9* 35.7*  MCV 83.9 85.5  --  83.1  --  82.9 83.0  --   --   PLT 300 336  --  343  --  295 292  --   --    < > = values in this interval not displayed.   Blood Culture    Component Value Date/Time   SDES SACRAL 06/30/2020 0013   SPECREQUEST NONE 06/30/2020 0013   CULT MULTIPLE ORGANISMS PRESENT, NONE PREDOMINANT (A) 06/30/2020 0013   REPTSTATUS 07/02/2020 FINAL 06/30/2020 0013     Physical Exam General: Large man, lying in bed, nad  Heart: RRR Lungs: Clear anteriorly  Abdomen: soft non-tender  Extremities: No sig LE edema  Dialysis Access: R IJ TDC   Medications: . iron sucrose 100 mg (07/08/20 1532)   . amLODipine  5 mg Oral QHS  . Chlorhexidine Gluconate Cloth  6 each Topical BID  . Chlorhexidine Gluconate Cloth  6 each Topical Q0600  . collagenase   Topical  Daily  . darbepoetin (ARANESP) injection - DIALYSIS  200 mcg Intravenous Q Tue-HD  . feeding supplement (NEPRO CARB STEADY)  237 mL Oral TID BM  . fluticasone  2 spray Each Nare Daily  . heparin injection (subcutaneous)  7,500 Units Subcutaneous Q8H  . insulin aspart  0-9 Units Subcutaneous TID WC  . insulin glargine  11 Units Subcutaneous QHS  . lanthanum  500 mg Oral TID WC  . multivitamin  1 tablet Oral QHS  . pantoprazole  40 mg Oral Q0600  . pneumococcal 23 valent vaccine  0.5 mL Intramuscular Tomorrow-1000  . vancomycin  125 mg Oral QID    Dialysis Orders:  NWTTS 4h 400/800 143.5kg 3K/2.5Ca TDCHeparin3000units IV TIW - Mircera158mgIVq2wks - last 12/8 - Venofer '100mg'$ IVqHD x10 - completed 6/10 doses  Assessment/Plan: 1. History of prolonged hospitalization with COVID-19 and consequent debilitation/sacrococcygeal decubitus/osteomyelitis: Status post completion of intravenous antibiotics and ongoing inpatient rehabilitation. 2. Clostridium difficile associated diarrhea: on oral vancomycin with  continued diarrhea and occasional nauseas. PRN imodium and zofran/compazine noted.   3. ESRD: HD TTS. Continue on schedule.  Next HD 2/19.  4. HTN/volume:  BP controlled. On amlodipine, hydralazine. No gross volume excess on exam. Minimal UF on HD.  5. Anemia: Hgb stable. On Aranesp 200 q Tuesday.  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 Continue Fosrenol binder.   7. Nutrition: Renal diet with fluid restriction. Cont prot supp for low albumin.   Lynnda Child PA-C [facility] 07/10/2020,11:41 AM

## 2020-07-10 NOTE — Progress Notes (Signed)
[place] PHYSICAL MEDICINE & REHABILITATION PROGRESS NOTE   Subjective/Complaints: No complaints this morning. No issues reported to me from overnight Hgb down to 10.6 from 11  ROS:   Pt denies SOB, abd pain, CP, N/V/C/D, and vision changes     Objective:   No results found. Recent Labs    07/08/20 1322 07/09/20 0509 07/09/20 1656 07/10/20 0645  WBC 8.3 8.7  --   --   HGB 9.9* 10.2* 11.0* 10.6*  HCT 32.1* 33.8* 35.9* 35.7*  PLT 295 292  --   --    Recent Labs    07/08/20 1322  NA 132*  K 5.1  CL 98  CO2 25  GLUCOSE 138*  BUN 47*  CREATININE 6.55*  CALCIUM 9.1    Intake/Output Summary (Last 24 hours) at 07/10/2020 1133 Last data filed at 07/09/2020 2130 Gross per 24 hour  Intake 220 ml  Output 550 ml  Net -330 ml     Pressure Injury 05/01/20 Buttocks Left Stage 4 - Full thickness tissue loss with exposed bone, tendon or muscle. packed with santyl gauze, dressed by WOCN, (Active)  05/01/20 0230  Location: Buttocks  Location Orientation: Left  Staging: Stage 4 - Full thickness tissue loss with exposed bone, tendon or muscle.  Wound Description (Comments): packed with santyl gauze, dressed by WOCN,  Present on Admission: Yes     Pressure Injury 05/01/20 Ankle Left;Lateral Unstageable - Full thickness tissue loss in which the base of the injury is covered by slough (yellow, tan, gray, green or brown) and/or eschar (tan, brown or black) in the wound bed. smal circular wound on the (Active)  05/01/20 0230  Location: Ankle  Location Orientation: Left;Lateral  Staging: Unstageable - Full thickness tissue loss in which the base of the injury is covered by slough (yellow, tan, gray, green or brown) and/or eschar (tan, brown or black) in the wound bed.  Wound Description (Comments): smal circular wound on the bone  Present on Admission: Yes     Pressure Injury 05/01/20 Foot Left;Lateral Unstageable - Full thickness tissue loss in which the base of the injury is  covered by slough (yellow, tan, gray, green or brown) and/or eschar (tan, brown or black) in the wound bed. 4th and 5th toes appear nec (Active)  05/01/20 0230  Location: Foot (4th and 5th toes and lateral/anterior side of the sole)  Location Orientation: Left;Lateral  Staging: Unstageable - Full thickness tissue loss in which the base of the injury is covered by slough (yellow, tan, gray, green or brown) and/or eschar (tan, brown or black) in the wound bed.  Wound Description (Comments): 4th and 5th toes appear necrotic circular wound on sole  Present on Admission: Yes     Pressure Injury 05/01/20 Foot Right;Medial;Posterior Unstageable - Full thickness tissue loss in which the base of the injury is covered by slough (yellow, tan, gray, green or brown) and/or eschar (tan, brown or black) in the wound bed. large circular wo (Active)  05/01/20 0230  Location: Foot  Location Orientation: Right;Medial;Posterior  Staging: Unstageable - Full thickness tissue loss in which the base of the injury is covered by slough (yellow, tan, gray, green or brown) and/or eschar (tan, brown or black) in the wound bed.  Wound Description (Comments): large circular wound on sole  and heel  Present on Admission: Yes     Pressure Injury 05/08/20 Heel Right Unstageable - Full thickness tissue loss in which the base of the injury is covered by slough (  yellow, tan, gray, green or brown) and/or eschar (tan, brown or black) in the wound bed. (Active)  05/08/20 1718  Location: Heel  Location Orientation: Right  Staging: Unstageable - Full thickness tissue loss in which the base of the injury is covered by slough (yellow, tan, gray, green or brown) and/or eschar (tan, brown or black) in the wound bed.  Wound Description (Comments):   Present on Admission: Yes    Physical Exam: Vital Signs Blood pressure (!) 148/101, pulse 97, temperature 99.6 F (37.6 C), temperature source Oral, resp. rate 17, height 6' 6"  (1.981 m),  weight 115.5 kg, SpO2 98 %. Gen: no distress, normal appearing HEENT: oral mucosa pink and moist, NCAT Cardio: Reg rate Chest: normal effort, normal rate of breathing Abd: soft, non-distended Ext: no edema Psych: sleepy Extremities: edema in extremities.  No tenderness in extremities. Neuro: Alert usually, but sleepy this AM Motor: Bilateral upper extremities: 5/5 proximal distal Bilateral lower extremities: 4-/5 proximal distal   Skin: Multiple foot wounds. L foot- has ruptured blood blister 5th MTP base; also 4th toe is dry gangrene- all black eschar. Large sacral wound- no change Feet wounds look the same- no significant improvement in healing since seen last time  Assessment/Plan: 1. Functional deficits which require 3+ hours per day of interdisciplinary therapy in a comprehensive inpatient rehab setting.  Physiatrist is providing close team supervision and 24 hour management of active medical problems listed below.  Physiatrist and rehab team continue to assess barriers to discharge/monitor patient progress toward functional and medical goals  Care Tool:  Bathing    Body parts bathed by patient: Right arm,Left arm,Chest,Abdomen,Face   Body parts bathed by helper: Front perineal area,Buttocks     Bathing assist Assist Level: Contact Guard/Touching assist     Upper Body Dressing/Undressing Upper body dressing   What is the patient wearing?: Pull over shirt    Upper body assist Assist Level: Supervision/Verbal cueing    Lower Body Dressing/Undressing Lower body dressing      What is the patient wearing?: Pants     Lower body assist Assist for lower body dressing: Minimal Assistance - Patient > 75%     Toileting Toileting    Toileting assist Assist for toileting: Minimal Assistance - Patient > 75% Assistive Device Comment:  (urinal)   Transfers Chair/bed transfer  Transfers assist  Chair/bed transfer activity did not occur: Safety/medical concerns (Per  nursing report)  Chair/bed transfer assist level: Minimal Assistance - Patient > 75% Chair/bed transfer assistive device: Sliding board,Armrests   Locomotion Ambulation   Ambulation assist   Ambulation activity did not occur: Safety/medical concerns (fatigue, lightheadedness, generalized weakness)  Assist level: Minimal Assistance - Patient > 75% Assistive device: Parallel bars Max distance: 24 ft   Walk 10 feet activity   Assist  Walk 10 feet activity did not occur: Safety/medical concerns (fatigue, lightheadedness, generalized weakness)  Assist level: Minimal Assistance - Patient > 75% Assistive device: Parallel bars   Walk 50 feet activity   Assist Walk 50 feet with 2 turns activity did not occur: Safety/medical concerns (fatigue, lightheadedness, generalized weakness)         Walk 150 feet activity   Assist Walk 150 feet activity did not occur: Safety/medical concerns (fatigue, lightheadedness, generalized weakness)         Walk 10 feet on uneven surface  activity   Assist Walk 10 feet on uneven surfaces activity did not occur: Safety/medical concerns (fatigue, lightheadedness, generalized weakness)  Wheelchair     Assist Will patient use wheelchair at discharge?: Yes Type of Wheelchair: Manual    Wheelchair assist level: Supervision/Verbal cueing Max wheelchair distance: 100    Wheelchair 50 feet with 2 turns activity    Assist        Assist Level: Supervision/Verbal cueing   Wheelchair 150 feet activity     Assist      Assist Level: Minimal Assistance - Patient > 75%   Blood pressure (!) 148/101, pulse 97, temperature 99.6 F (37.6 C), temperature source Oral, resp. rate 17, height 6' 6"  (1.981 m), weight 115.5 kg, SpO2 98 %.  Medical Problem List and Plan: 1.  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  Antithrombotics: -DVT/anticoagulation:  Continue Subcutaneous heparin.               -antiplatelet therapy: N/A 3. Pain Management: Continue Tylenol as needed  2/11- denied pain- con't regimen  2/16- having abdominal pain but controlled with tylenol 4. Mood: Provide emotional support             -antipsychotic agents: N/A 5. Neuropsych: This patient is? capable of making decisions on his own behalf. 6. Skin/Wound Care: Routine skin checks 7. Fluids/Electrolytes/Nutrition: Routine in and outs with follow-up chemistries 8.  ESRD.  Hemodialysis per renal services 9.  Sacral decubitus ulcer as well as ischemic ulcers on the left foot fourth toe and fifth metatarsal head and right heel and fifth metatarsal head.  Wound care nurse follow-up.  Patient to see Dr. Sharol Given outpatient.  Continue bilateral PRAFO's. 10.  ID/ coccygeal osteomyelitis.    Completed vancomycin and cefepime on 06/10/2020  1/18- will schedule turning q2 hours to get off wounds- also will make sure on specialty bed  2/7- CRP 6.5- ESR pending - con't regimen  2/8- ESR  DOWN TO 67 FROM 73- SO HEADING IN RIGHT DIRECTION  2/9- Spiked temp last night of 100.9- restarted Vanc/Cefipime- pt refused Vanc "since causes diarrhea". WBC now normal again this AM- will con't for now. CXR (-); is anuric-  2/10- having fevers/sweats/chills- Tm 99.4- needs to also take Vanc- told this to pt- also, called ID and also ordered another CT of pelvis due to Sx's-    2/13 ID saw pt Friday. Thinks that low grade temps are coming from HD cath. Was not impressed by MRI of pelvis. Recommended stopping abx (d/t loose stool)   -continue to monitor   -continue [facility] per nephrology   -afebrile last 36 hours  2/18- won't stay off backside- is not healing - even with encouragement, not turning off backside  10.  Anemia of chronic disease.  Continue Aranesp.   Hemoglobin 10.0 on 2/3, labs with HD 11.  Diabetes mellitus with peripheral neuropathy .  Hemoglobin A1c 5.6.  Lantus insulin 11 units nightly  CBG  (last 3)  Recent Labs    07/09/20 1654 07/09/20 2108 07/10/20 0608  GLUCAP 116* 113* 103*    2/19- BGs 103-116- continue regimen 12.  Morbid obesity.  BMI 38.37.  Dietary follow-up 13. Dizziness/nausea with HTN and hypotension  Vitals:   07/09/20 1414 07/09/20 1921  BP: 138/87 (!) 148/101  Pulse: 95 97  Resp: 13 17  Temp: 99.7 F (37.6 C) 99.6 F (37.6 C)  SpO2: 100% 98%    Hydralazine prn   Norvasc to 7.5 mg daily   2/9- BP 156/85 this AM- but has been controlled last few days  2/12-13-bps  still with some fluctuation but not severe. Continue current meds, volume mgt per nephrology  2/15- BP's 150s-160s/90s- on Norvasc 7.5- can increase, but usually has orthostatic hypotension when we do. Will d/w Renal  2/16- "passed out" yesterday= will call renal   2/17- renal note shows they will not pull off fluid today in HD due to "passing out event" and diarrhea fluid losses  2/19- BP up to 148/101 last night- continue regimen per renal 14. Diarrhea with new constipation  Bowel meds increased on 2/5  2/7- will decrease bowel meds again since 8+ BMs in last 24 hours- con't regimen  2/12 stool still mushy. Stopped miralax  2/14- will allow them to use the Imodium prn that's ordered- since still having so many stools- he does this from IV ABX  2/18- has C diff- off Imodium 15. Leukocytosis: Resolved  ESR/CRP and CBC with diff ordered for Monday.  2/7- CRP 6.5- stable- con't regimen   2/8- EST 67- down to 73- con't regimen  2/11- Labs looking slightly better, but imaging does not- waiting to hear from ID  2/14- per ID, pelvis osteomyelitis not the issue- off ABX per their request 16. N/Vomiting-  2/10- likely due to underlying illness- will do ID w/u as above.  17. 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   2/16- abdominal pain slightly better  2/17- BMs less frequent per  pt and less abd pain- con't regimen- will recheck labs   2/18- WBC down to 8.7- no Left shift- 1 watery BM today- is doing better- except had Tm 100.5 overnight- if spikes again, might need to call ID back.  16. Dispo  2/8- pt unhappy that has to back to SNF- however has been participating intermittently- finally doing more/more interactive, but we can only keep max of 30 days. Sent FL2  2/9- pt refused PT yesterday -only bedlevel Exercises- pt needs max encouragement to progress with therapy   2/10- might have to hold d/c- we will see.  2/11- might not be able to d/c Monday per SNF search/SW   2/15- told SW that cannot d/c right now due to C Diff.    LOS: 34 days A FACE TO FACE EVALUATION WAS PERFORMED  Clide Deutscher Shelonda Saxe 07/10/2020, 11:33 AM

## 2020-07-11 LAB — HEMOGLOBIN AND HEMATOCRIT, BLOOD
HCT: 34.1 % — ABNORMAL LOW (ref 39.0–52.0)
HCT: 35.4 % — ABNORMAL LOW (ref 39.0–52.0)
Hemoglobin: 10.4 g/dL — ABNORMAL LOW (ref 13.0–17.0)
Hemoglobin: 10.8 g/dL — ABNORMAL LOW (ref 13.0–17.0)

## 2020-07-11 LAB — GLUCOSE, CAPILLARY
Glucose-Capillary: 103 mg/dL — ABNORMAL HIGH (ref 70–99)
Glucose-Capillary: 112 mg/dL — ABNORMAL HIGH (ref 70–99)
Glucose-Capillary: 123 mg/dL — ABNORMAL HIGH (ref 70–99)
Glucose-Capillary: 99 mg/dL (ref 70–99)

## 2020-07-11 NOTE — Progress Notes (Signed)
Physical Therapy Session Note  [name] MRN: [id number] Date of Birth: Oct 08, 1970  Today's Date: 07/11/2020 PT Individual Time: 0800-0900 PT Individual Time Calculation (min): 60 min   Short Term Goals: Week 5:  PT Short Term Goal 1 (Week 5): pt to demonstrate to [facility] mobility at 72' CGA PT Short Term Goal 2 (Week 5): pt to demonstrate min A functional transfers consistently PT Short Term Goal 3 (Week 5): pt to demonstrate supervision bed mobility consistently  Skilled Therapeutic Interventions/Progress Updates:     Patient in bed with RN performing wound assessment and dressing change upon PT arrival. Patient alert and agreeable to PT session. Patient reported 8/10 abdominal pain during session, RN made aware. PT provided repositioning, rest breaks, and distraction as pain interventions throughout session. Patient also reported 5/10 nausea during session.   Therapeutic Activity: Bed Mobility: Patient performed rolling R/L with supervision with use of bed rails. Patient was incontinent of bowl, required total A for peri-care and doffing/donning incontinence brief and patient independently pulled up his pants in supine performing bridge. He performedd supine to/from sit with min A-CGA due to challenges sitting up on the air matress. Provided verbal cues for icreased shoulder rotation to roll to side-lying then come to sitting for improved mechanics and leverage on the air mattress. Patient tolerated sitting EOB with supervision >25 min eating breakfast (1 yogurt) and as PT donned 1-4"and 1-6" ACE wraps to B lower extremities. Patient asymptomatic in sitting. Educated on benefits of sitting up EOB, nursing in agreement to have patient sit EOB for meals. Patient continues to express frustration with loss of functional mobility secondary to c-diff.  Transfers: Patient attempted sit to/from stand x2 from deflated air mattress with max A using RW, patient unable to forward  weight shift to clear his hips to set-up for boosting up from the bed. Provided verbal cues for scooting forward with min A, foot placement, forward weight shift with facilitation, and hand placement.  Therapeutic Exercise: Patient performed the following exercises with verbal and tactile cues for proper technique. -B heel cord stretch x1.5 min during dressing change by RN -B quad sets x10 with 3-5 sec hold during dressing changes -B LAQ x10 -seated marching x10  Patient in bed, re-inflated to initial settings, at end of session with breaks locked, bed alarm set, and all needs within reach.    Therapy Documentation Precautions:  Precautions Precautions: Fall Precaution Comments: wounds on bilat feet and L sacrum, inconsistent orthostatic BPs Required Braces or Orthoses: Other Brace Other Brace: abdominal binder PRN with standing/strenuous activity Restrictions Weight Bearing Restrictions: No   Therapy/Group: Individual Therapy  Daivik Overley L Jahi Roza PT, DPT  07/11/2020, 12:27 PM

## 2020-07-11 NOTE — Progress Notes (Signed)
[place] PHYSICAL MEDICINE & REHABILITATION PROGRESS NOTE   Subjective/Complaints: No issues overnight Vitals signs stable except for tachycardia up to 105, BP of 160s/90s last night but normal this morning Hgb stable at 10.4 No complaints this morning  ROS:   Pt denies SOB, abd pain, CP, N/V/C/D, and vision changes     Objective:   No results found. Recent Labs    07/09/20 0509 07/09/20 1656 07/10/20 1239 07/10/20 1653 07/11/20 0536  WBC 8.7  --  9.1  --   --   HGB 10.2*   < > 10.0* 10.9* 10.4*  HCT 33.8*   < > 33.2* 35.8* 34.1*  PLT 292  --  327  --   --    < > = values in this interval not displayed.   Recent Labs    07/08/20 1322 07/10/20 1239  NA 132* 134*  K 5.1 4.6  CL 98 99  CO2 25 27  GLUCOSE 138* 103*  BUN 47* 29*  CREATININE 6.55* 5.75*  CALCIUM 9.1 9.1    Intake/Output Summary (Last 24 hours) at 07/11/2020 0705 Last data filed at 07/11/2020 0443 Gross per 24 hour  Intake -  Output 700 ml  Net -700 ml     Pressure Injury 05/01/20 Buttocks Left Stage 4 - Full thickness tissue loss with exposed bone, tendon or muscle. packed with santyl gauze, dressed by WOCN, (Active)  05/01/20 0230  Location: Buttocks  Location Orientation: Left  Staging: Stage 4 - Full thickness tissue loss with exposed bone, tendon or muscle.  Wound Description (Comments): packed with santyl gauze, dressed by WOCN,  Present on Admission: Yes     Pressure Injury 05/01/20 Ankle Left;Lateral Unstageable - Full thickness tissue loss in which the base of the injury is covered by slough (yellow, tan, gray, green or brown) and/or eschar (tan, brown or black) in the wound bed. smal circular wound on the (Active)  05/01/20 0230  Location: Ankle  Location Orientation: Left;Lateral  Staging: Unstageable - Full thickness tissue loss in which the base of the injury is covered by slough (yellow, tan, gray, green or brown) and/or eschar (tan, brown or black) in the wound bed.  Wound  Description (Comments): smal circular wound on the bone  Present on Admission: Yes     Pressure Injury 05/01/20 Foot Left;Lateral Unstageable - Full thickness tissue loss in which the base of the injury is covered by slough (yellow, tan, gray, green or brown) and/or eschar (tan, brown or black) in the wound bed. 4th and 5th toes appear nec (Active)  05/01/20 0230  Location: Foot (4th and 5th toes and lateral/anterior side of the sole)  Location Orientation: Left;Lateral  Staging: Unstageable - Full thickness tissue loss in which the base of the injury is covered by slough (yellow, tan, gray, green or brown) and/or eschar (tan, brown or black) in the wound bed.  Wound Description (Comments): 4th and 5th toes appear necrotic circular wound on sole  Present on Admission: Yes     Pressure Injury 05/01/20 Foot Right;Medial;Posterior Unstageable - Full thickness tissue loss in which the base of the injury is covered by slough (yellow, tan, gray, green or brown) and/or eschar (tan, brown or black) in the wound bed. large circular wo (Active)  05/01/20 0230  Location: Foot  Location Orientation: Right;Medial;Posterior  Staging: Unstageable - Full thickness tissue loss in which the base of the injury is covered by slough (yellow, tan, gray, green or brown) and/or eschar (tan, brown or  black) in the wound bed.  Wound Description (Comments): large circular wound on sole  and heel  Present on Admission: Yes     Pressure Injury 05/08/20 Heel Right Unstageable - Full thickness tissue loss in which the base of the injury is covered by slough (yellow, tan, gray, green or brown) and/or eschar (tan, brown or black) in the wound bed. (Active)  05/08/20 1718  Location: Heel  Location Orientation: Right  Staging: Unstageable - Full thickness tissue loss in which the base of the injury is covered by slough (yellow, tan, gray, green or brown) and/or eschar (tan, brown or black) in the wound bed.  Wound Description  (Comments):   Present on Admission: Yes    Physical Exam: Vital Signs Blood pressure 127/73, pulse (!) 105, temperature 100.1 F (37.8 C), temperature source Oral, resp. rate 18, height 6' 6"  (1.981 m), weight 115 kg, SpO2 97 %. Gen: no distress, normal appearing HEENT: oral mucosa pink and moist, NCAT Cardio: Tachycardia Chest: normal effort, normal rate of breathing Abd: soft, non-distended Extremities: edema in extremities.  No tenderness in extremities. Neuro: Alert usually, but sleepy this AM Motor: Bilateral upper extremities: 5/5 proximal distal Bilateral lower extremities: 4-/5 proximal distal   Skin: Multiple foot wounds. L foot- has ruptured blood blister 5th MTP base; also 4th toe is dry gangrene- all black eschar. Large sacral wound- no change Feet wounds look the same- no significant improvement in healing since seen last time  Assessment/Plan: 1. Functional deficits which require 3+ hours per day of interdisciplinary therapy in a comprehensive inpatient rehab setting.  Physiatrist is providing close team supervision and 24 hour management of active medical problems listed below.  Physiatrist and rehab team continue to assess barriers to discharge/monitor patient progress toward functional and medical goals  Care Tool:  Bathing    Body parts bathed by patient: Right arm,Left arm,Chest,Abdomen,Face   Body parts bathed by helper: Front perineal area,Buttocks     Bathing assist Assist Level: Contact Guard/Touching assist     Upper Body Dressing/Undressing Upper body dressing   What is the patient wearing?: Pull over shirt    Upper body assist Assist Level: Supervision/Verbal cueing    Lower Body Dressing/Undressing Lower body dressing      What is the patient wearing?: Pants     Lower body assist Assist for lower body dressing: Minimal Assistance - Patient > 75%     Toileting Toileting    Toileting assist Assist for toileting: Minimal Assistance -  Patient > 75% Assistive Device Comment:  (urinal)   Transfers Chair/bed transfer  Transfers assist  Chair/bed transfer activity did not occur: Safety/medical concerns (Per nursing report)  Chair/bed transfer assist level: Minimal Assistance - Patient > 75% Chair/bed transfer assistive device: Sliding board,Armrests   Locomotion Ambulation   Ambulation assist   Ambulation activity did not occur: Safety/medical concerns (fatigue, lightheadedness, generalized weakness)  Assist level: Minimal Assistance - Patient > 75% Assistive device: Parallel bars Max distance: 24 ft   Walk 10 feet activity   Assist  Walk 10 feet activity did not occur: Safety/medical concerns (fatigue, lightheadedness, generalized weakness)  Assist level: Minimal Assistance - Patient > 75% Assistive device: Parallel bars   Walk 50 feet activity   Assist Walk 50 feet with 2 turns activity did not occur: Safety/medical concerns (fatigue, lightheadedness, generalized weakness)         Walk 150 feet activity   Assist Walk 150 feet activity did not occur: Safety/medical concerns (fatigue, lightheadedness,  generalized weakness)         Walk 10 feet on uneven surface  activity   Assist Walk 10 feet on uneven surfaces activity did not occur: Safety/medical concerns (fatigue, lightheadedness, generalized weakness)         Wheelchair     Assist Will patient use wheelchair at discharge?: Yes Type of Wheelchair: Manual    Wheelchair assist level: Supervision/Verbal cueing Max wheelchair distance: 100    Wheelchair 50 feet with 2 turns activity    Assist        Assist Level: Supervision/Verbal cueing   Wheelchair 150 feet activity     Assist      Assist Level: Minimal Assistance - Patient > 75%   Blood pressure 127/73, pulse (!) 105, temperature 100.1 F (37.8 C), temperature source Oral, resp. rate 18, height 6' 6"  (1.981 m), weight 115 kg, SpO2 97 %.  Medical  Problem List and Plan: 1.  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  Antithrombotics: -DVT/anticoagulation: Continue Subcutaneous heparin.               -antiplatelet therapy: N/A 3. Pain Management: Continue Tylenol as needed, last received 2/20 AM  2/11- denied pain- con't regimen  2/16- having abdominal pain but controlled with tylenol 4. Mood: Provide emotional support             -antipsychotic agents: N/A 5. Neuropsych: This patient is? capable of making decisions on his own behalf. 6. Skin/Wound Care: Routine skin checks 7. Fluids/Electrolytes/Nutrition: Routine in and outs with follow-up chemistries 8.  ESRD.  Hemodialysis per renal services 9.  Sacral decubitus ulcer as well as ischemic ulcers on the left foot fourth toe and fifth metatarsal head and right heel and fifth metatarsal head.  Wound care nurse follow-up.  Patient to see Dr. Sharol Given outpatient.  Continue bilateral PRAFO's. 10.  ID/ coccygeal osteomyelitis.    Completed vancomycin and cefepime on 06/10/2020  1/18- will schedule turning q2 hours to get off wounds- also will make sure on specialty bed  2/7- CRP 6.5- ESR pending - con't regimen  2/8- ESR  DOWN TO 67 FROM 73- SO HEADING IN RIGHT DIRECTION  2/9- Spiked temp last night of 100.9- restarted Vanc/Cefipime- pt refused Vanc "since causes diarrhea". WBC now normal again this AM- will con't for now. CXR (-); is anuric-  2/10- having fevers/sweats/chills- Tm 99.4- needs to also take Vanc- told this to pt- also, called ID and also ordered another CT of pelvis due to Sx's-    2/13 ID saw pt Friday. Thinks that low grade temps are coming from HD cath. Was not impressed by MRI of pelvis. Recommended stopping abx (d/t loose stool)   -continue to monitor   -continue [facility] per nephrology   -afebrile last 36 hours  2/18- won't stay off backside- is not healing - even with encouragement, not turning off backside   10.  Anemia of chronic disease.  Continue Aranesp.   Hemoglobin 10.0 on 2/3, labs with HD 11.  Diabetes mellitus with peripheral neuropathy .  Hemoglobin A1c 5.6.  Lantus insulin 11 units nightly  CBG (last 3)  Recent Labs    07/10/20 1700 07/10/20 2108 07/11/20 0618  GLUCAP 84 116* 103*    2/20- BGs 84-116- continue regimen 12.  Morbid obesity.  BMI 38.37.  Dietary follow-up 13. Dizziness/nausea with HTN and hypotension  Vitals:   07/10/20 1931 07/11/20 0442  BP: (!) 161/91 127/73  Pulse: (!) 103 (!) 105  Resp: 17 18  Temp: 99.8 F (37.7 C) 100.1 F (37.8 C)  SpO2: 99% 97%    Hydralazine prn   Norvasc to 7.5 mg daily   2/9- BP 156/85 this AM- but has been controlled last few days  2/12-13-bps still with some fluctuation but not severe. Continue current meds, volume mgt per nephrology  2/15- BP's 150s-160s/90s- on Norvasc 7.5- can increase, but usually has orthostatic hypotension when we do. Will d/w Renal  2/16- "passed out" yesterday= will call renal   2/17- renal note shows they will not pull off fluid today in HD due to "passing out event" and diarrhea fluid losses  2/19- BP up to 148/101 last night- continue regimen per renal 14. Diarrhea with new constipation  Bowel meds increased on 2/5  2/7- will decrease bowel meds again since 8+ BMs in last 24 hours- con't regimen  2/12 stool still mushy. Stopped miralax  2/14- will allow them to use the Imodium prn that's ordered- since still having so many stools- he does this from IV ABX  2/18- has C diff- off Imodium 15. Leukocytosis: Resolved  ESR/CRP and CBC with diff ordered for Monday.  2/7- CRP 6.5- stable- con't regimen   2/8- EST 67- down to 73- con't regimen  2/11- Labs looking slightly better, but imaging does not- waiting to hear from ID  2/14- per ID, pelvis osteomyelitis not the issue- off ABX per their request 16. N/Vomiting-  2/10- likely due to underlying illness- will do ID w/u as above.  17. C  Diff  2/15- has new C Diff with hx of C Diff- also has bowel incontinence due to sphincter issues- will start PO Vanc for him- d/w pharmacy- same dose.   2/16- abdominal pain slightly better  2/17- BMs less frequent per pt and less abd pain- con't regimen- will recheck labs   2/18- WBC down to 8.7- no Left shift- 1 watery BM today- is doing better- except had Tm 100.5 overnight- if spikes again, might need to call ID back.  16. Dispo  2/8- pt unhappy that has to back to SNF- however has been participating intermittently- finally doing more/more interactive, but we can only keep max of 30 days. Sent FL2  2/9- pt refused PT yesterday -only bedlevel Exercises- pt needs max encouragement to progress with therapy   2/10- might have to hold d/c- we will see.  2/11- might not be able to d/c Monday per SNF search/SW   2/15- told SW that cannot d/c right now due to C Diff.    LOS: 35 days A FACE TO FACE EVALUATION WAS PERFORMED  Fred [name] 07/11/2020, 7:05 AM

## 2020-07-11 NOTE — Progress Notes (Signed)
[facility] Progress Note   Assessment/ Plan:   1.  History of prolonged hospitalization with COVID-19 and consequent debilitation/sacrococcygeal decubitus/osteomyelitis: Status post completion of intravenous antibiotics and ongoing inpatient rehabilitation.  Suboptimal participation noted with physical therapy noted as well as efforts at keeping pressure off sacral decubitus.  Process underway for SNF placement. 2. Clostridium difficile associated diarrhea: on oral vancomycin with continued diarrhea and occasional nauseas. PRN imodium and zofran/compazine noted.   3. ESRD: Continue hemodialysis on a TTS schedule with next hemodialysis due for 2/22 after dialysis done yesterday.  He is close to euvolemic on exam and does not have any acute indications for dialysis today. 4. Anemia: Without overt blood loss, continue monitoring hemoglobin/hematocrit trend-on high-dose ESA. 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 6. Nutrition: Continue renal diet with ongoing fluid restriction and nutritional supplements/renal MVI. 7. Hypertension: Blood pressure currently acceptable with overnight elevations noted-monitor with ongoing treatment on amlodipine/hydralazine.  Subjective:   He continues to feel poorly after diarrhea overnight and some abdominal discomfort/poor appetite.    Objective:   BP 127/73 (BP Location: Right Arm)   Pulse (!) 105   Temp 100.1 F (37.8 C) (Oral)   Resp 18   Ht '6\' 6"'$  (1.981 m)   Wt 115 kg   SpO2 97%   BMI 29.30 kg/m   Physical Exam: Gen: Appears to be uncomfortable resting in bed, nontoxic-appearing. CVS: Pulse regular rhythm, normal rate, S1 and S2 normal Resp: Anteriorly clear to auscultation, no distinct rales or rhonchi.  Right IJ TDC in place Abd: Soft, obese, nontender, bowel sounds normal Ext: No ankle edema bilaterally  Labs: BMET Recent Labs  Lab 07/05/20 0555 07/06/20 1606 07/08/20 1322 07/10/20 1239  NA 135  135 132* 134*  K 4.3 5.1 5.1 4.6  CL 99 99 98 99  CO2 '26 26 25 27  '$ GLUCOSE 113* 96 138* 103*  BUN 47* 60* 47* 29*  CREATININE 5.46* 6.72* 6.55* 5.75*  CALCIUM 9.1 9.2 9.1 9.1  PHOS 3.9 4.7* 4.4 4.2   CBC Recent Labs  Lab 07/05/20 0555 07/05/20 1422 07/06/20 0948 07/06/20 1700 07/08/20 1322 07/09/20 0509 07/09/20 1656 07/10/20 0645 07/10/20 1239 07/10/20 1653 07/11/20 0536  WBC 10.2   < > 10.7*  --  8.3 8.7  --   --  9.1  --   --   NEUTROABS 6.5  --  6.9  --   --  5.3  --   --   --   --   --   HGB 10.0*   < > 10.4*   < > 9.9* 10.2*   < > 10.6* 10.0* 10.9* 10.4*  HCT 33.3*   < > 32.9*   < > 32.1* 33.8*   < > 35.7* 33.2* 35.8* 34.1*  MCV 83.9   < > 83.1  --  82.9 83.0  --   --  83.8  --   --   PLT 300   < > 343  --  295 292  --   --  327  --   --    < > = values in this interval not displayed.      Medications:    . amLODipine  5 mg Oral QHS  . Chlorhexidine Gluconate Cloth  6 each Topical BID  . Chlorhexidine Gluconate Cloth  6 each Topical Q0600  . collagenase   Topical Daily  . darbepoetin (ARANESP) injection - DIALYSIS  200 mcg Intravenous Q Tue-HD  .  feeding supplement (NEPRO CARB STEADY)  237 mL Oral TID BM  . fluticasone  2 spray Each Nare Daily  . heparin injection (subcutaneous)  7,500 Units Subcutaneous Q8H  . insulin aspart  0-9 Units Subcutaneous TID WC  . insulin glargine  11 Units Subcutaneous QHS  . lanthanum  500 mg Oral TID WC  . multivitamin  1 tablet Oral QHS  . pantoprazole  40 mg Oral Q0600  . pneumococcal 23 valent vaccine  0.5 mL Intramuscular Tomorrow-1000  . vancomycin  125 mg Oral QID    Elmarie Shiley, MD 07/11/2020, 10:01 AM

## 2020-07-11 NOTE — Progress Notes (Signed)
Patients pulse was elevated due to vomiting.  Zofran was administered, will recheck pulse in 30 minutes.

## 2020-07-12 LAB — CBC WITH DIFFERENTIAL/PLATELET
Abs Immature Granulocytes: 0.07 10*3/uL (ref 0.00–0.07)
Basophils Absolute: 0 10*3/uL (ref 0.0–0.1)
Basophils Relative: 0 %
Eosinophils Absolute: 0.2 10*3/uL (ref 0.0–0.5)
Eosinophils Relative: 2 %
HCT: 34.3 % — ABNORMAL LOW (ref 39.0–52.0)
Hemoglobin: 10.7 g/dL — ABNORMAL LOW (ref 13.0–17.0)
Immature Granulocytes: 1 %
Lymphocytes Relative: 15 %
Lymphs Abs: 1.6 10*3/uL (ref 0.7–4.0)
MCH: 25.8 pg — ABNORMAL LOW (ref 26.0–34.0)
MCHC: 31.2 g/dL (ref 30.0–36.0)
MCV: 82.7 fL (ref 80.0–100.0)
Monocytes Absolute: 1.2 10*3/uL — ABNORMAL HIGH (ref 0.1–1.0)
Monocytes Relative: 11 %
Neutro Abs: 7.7 10*3/uL (ref 1.7–7.7)
Neutrophils Relative %: 71 %
Platelets: 295 10*3/uL (ref 150–400)
RBC: 4.15 MIL/uL — ABNORMAL LOW (ref 4.22–5.81)
RDW: 16.3 % — ABNORMAL HIGH (ref 11.5–15.5)
WBC: 10.8 10*3/uL — ABNORMAL HIGH (ref 4.0–10.5)
nRBC: 0 % (ref 0.0–0.2)

## 2020-07-12 LAB — HEMOGLOBIN AND HEMATOCRIT, BLOOD
HCT: 32.9 % — ABNORMAL LOW (ref 39.0–52.0)
Hemoglobin: 10.6 g/dL — ABNORMAL LOW (ref 13.0–17.0)

## 2020-07-12 LAB — GLUCOSE, CAPILLARY
Glucose-Capillary: 78 mg/dL (ref 70–99)
Glucose-Capillary: 104 mg/dL — ABNORMAL HIGH (ref 70–99)
Glucose-Capillary: 96 mg/dL (ref 70–99)
Glucose-Capillary: 138 mg/dL — ABNORMAL HIGH (ref 70–99)

## 2020-07-12 LAB — RENAL FUNCTION PANEL
Albumin: 1.9 g/dL — ABNORMAL LOW (ref 3.5–5.0)
Anion gap: 7 (ref 5–15)
BUN: 27 mg/dL — ABNORMAL HIGH (ref 6–20)
CO2: 28 mmol/L (ref 22–32)
Calcium: 8.8 mg/dL — ABNORMAL LOW (ref 8.9–10.3)
Chloride: 101 mmol/L (ref 98–111)
Creatinine, Ser: 5.72 mg/dL — ABNORMAL HIGH (ref 0.61–1.24)
GFR, Estimated: 11 mL/min — ABNORMAL LOW (ref >60)
Glucose, Bld: 83 mg/dL (ref 70–99)
Phosphorus: 4.2 mg/dL (ref 2.5–4.6)
Potassium: 4.1 mmol/L (ref 3.5–5.1)
Sodium: 136 mmol/L (ref 135–145)

## 2020-07-12 LAB — C-REACTIVE PROTEIN: CRP: 10.3 mg/dL — ABNORMAL HIGH (ref <1.0)

## 2020-07-12 LAB — SEDIMENTATION RATE: Sed Rate: 50 mm/hr — ABNORMAL HIGH (ref 0–16)

## 2020-07-12 NOTE — Progress Notes (Signed)
Physical Therapy Weekly Progress Note  [name] MRN: [id number] Date of Birth: Nov 25, 1970  Beginning of progress report period: July 05, 2020 End of progress report period: July 12, 2020  Today's Date: 07/12/2020 PT Individual Time: 0092-3300 PT Individual Time Calculation (min): 34 min   Patient has met 1 of 3 short term goals.  Pt has had medical setback with diagnosis of C. Diff which has limited ability to fully participate with PT 2/2 nausea, vomiting, diarrhea and stomach pain. Pt has participated with sitting EOB balance training, intermittent transfer training, and strengthening. Pt continues to require high levels of assistance for transfers when agreeable to attempt. Pt also continues to have limited tolerance to upright positioning especially in standing.   Patient continues to demonstrate the following deficits muscle weakness, decreased cardiorespiratoy endurance and decreased sitting balance, decreased standing balance, decreased postural control and decreased balance strategies and therefore will continue to benefit from skilled PT intervention to increase functional independence with mobility.  Patient progressing toward long term goals..  Continue plan of care.  PT Short Term Goals Week 5:  PT Short Term Goal 1 (Week 5): pt to demonstrate to [facility] mobility at 51' CGA PT Short Term Goal 1 - Progress (Week 5): Not met PT Short Term Goal 2 (Week 5): pt to demonstrate min A functional transfers consistently PT Short Term Goal 2 - Progress (Week 5): Not met PT Short Term Goal 3 (Week 5): pt to demonstrate supervision bed mobility consistently PT Short Term Goal 3 - Progress (Week 5): Met Week 6:  PT Short Term Goal 1 (Week 6): pt to demonstrate to [facility] mobility at 50' CGA PT Short Term Goal 2 (Week 6): pt to demonstrate min A functional transfers consistently PT Short Term Goal 3 (Week 6): pt to be able to direct all care needs for transfer setup,  equipment, and skin intregity  Skilled Therapeutic Interventions/Progress Updates:    pt received in bed and agreeable to therapy "as I can handle it". Pt reported 9/10 stomach pain at start of session. Pt demonstrated limited eye opening during session regardless of position reporting when eyes open he felt more nauseous. Pt declined transfer training seveal times this session, with walker, stedy and slide board as options. Pt declined immediately to attempt transferring to [facility], reporting "I just can't today". Pt given max encouragement however continued to decline. Pt agreeable to transferring into sitting EOB. Pt directed in supine>sit EOB supervision. Pt sat EOB at CGA initially, improved to SBA for 15 mins. Pt sat with trunk flexed posture and forward head carriage with eyes closed throughout, reported he felt tired and weak and educated on importance of progressing and attempting to particiapte in additional therapy outside of sitting EOB, pt reported he understood but did not feel well and wanted to rest. Pt requested to return to supine, encouraged to attempt sitting EOB for remainder of session however declined. Pt returned to supine, SBA. Pt left in supine, All needs in reach and in good condition. Call light in hand.    Therapy Documentation Precautions:  Precautions Precautions: Fall Precaution Comments: wounds on bilat feet and L sacrum, inconsistent orthostatic BPs Required Braces or Orthoses: Other Brace Other Brace: abdominal binder PRN with standing/strenuous activity Restrictions Weight Bearing Restrictions: No General: PT Amount of Missed Time (min): 26 Minutes PT Missed Treatment Reason: Patient ill (Comment);Patient unwilling to participate Vital Signs: Therapy Vitals Temp: 98.9 F (37.2 C) Temp Source: Oral Pulse Rate: 99  Resp: 18 BP: (!) 149/77 Patient Position (if appropriate): Lying Oxygen Therapy SpO2: 100 % O2 Device: Room Air Pain: Pain Assessment Pain Scale:  0-10 Pain Score: 0-No pain Vision/Perception     Mobility:   Locomotion :    Trunk/Postural Assessment :    Balance:   Exercises:   Other Treatments:     Therapy/Group: Individual Therapy  [name] 07/12/2020, 8:37 AM

## 2020-07-12 NOTE — Progress Notes (Signed)
[facility] Progress Note   Subjective:  Seen at bedside. No new events overnight.  Continued GI upset (nausea/diarrhea) limiting his ability to participate in therapy.    Objective Vitals:   07/11/20 2153 07/12/20 0150 07/12/20 0525 07/12/20 0831  BP: (!) 136/95 (!) 145/99 (!) 157/87 (!) 149/77  Pulse: (!) 106 (!) 103 (!) 106 99  Resp: '18 18 18 18  '$ Temp: 99.6 F (37.6 C) 98.9 F (37.2 C) 99.9 F (37.7 C) 98.9 F (37.2 C)  TempSrc:   Oral   SpO2: 98% 99% 100% 100%  Weight:      Height:         Additional Objective Labs: Basic Metabolic Panel: Recent Labs  Lab 07/08/20 1322 07/10/20 1239 07/12/20 0611  NA 132* 134* 136  K 5.1 4.6 4.1  CL 98 99 101  CO2 '25 27 28  '$ GLUCOSE 138* 103* 83  BUN 47* 29* 27*  CREATININE 6.55* 5.75* 5.72*  CALCIUM 9.1 9.1 8.8*  PHOS 4.4 4.2 4.2   CBC: Recent Labs  Lab 07/06/20 0948 07/06/20 1700 07/08/20 1322 07/09/20 0509 07/09/20 1656 07/10/20 1239 07/10/20 1653 07/11/20 0536 07/11/20 1645 07/12/20 0611  WBC 10.7*  --  8.3 8.7  --  9.1  --   --   --  10.8*  NEUTROABS 6.9  --   --  5.3  --   --   --   --   --  7.7  HGB 10.4*   < > 9.9* 10.2*   < > 10.0*   < > 10.4* 10.8* 10.7*  HCT 32.9*   < > 32.1* 33.8*   < > 33.2*   < > 34.1* 35.4* 34.3*  MCV 83.1  --  82.9 83.0  --  83.8  --   --   --  82.7  PLT 343  --  295 292  --  327  --   --   --  295   < > = values in this interval not displayed.   Blood Culture    Component Value Date/Time   SDES SACRAL 06/30/2020 0013   SPECREQUEST NONE 06/30/2020 0013   CULT MULTIPLE ORGANISMS PRESENT, NONE PREDOMINANT (A) 06/30/2020 0013   REPTSTATUS 07/02/2020 FINAL 06/30/2020 0013     Physical Exam General: Large man, lying in bed, nad  Heart: RRR Lungs: Clear anteriorly  Abdomen: soft non-tender  Extremities: No sig LE edema  Dialysis Access: R IJ TDC   Medications: . iron sucrose 100 mg (07/10/20 1511)   . amLODipine  5 mg Oral QHS  . Chlorhexidine  Gluconate Cloth  6 each Topical BID  . Chlorhexidine Gluconate Cloth  6 each Topical Q0600  . collagenase   Topical Daily  . darbepoetin (ARANESP) injection - DIALYSIS  200 mcg Intravenous Q Tue-HD  . feeding supplement (NEPRO CARB STEADY)  237 mL Oral TID BM  . fluticasone  2 spray Each Nare Daily  . heparin injection (subcutaneous)  7,500 Units Subcutaneous Q8H  . insulin aspart  0-9 Units Subcutaneous TID WC  . insulin glargine  11 Units Subcutaneous QHS  . lanthanum  500 mg Oral TID WC  . multivitamin  1 tablet Oral QHS  . pantoprazole  40 mg Oral Q0600  . pneumococcal 23 valent vaccine  0.5 mL Intramuscular Tomorrow-1000  . vancomycin  125 mg Oral QID    Dialysis Orders:  NWTTS 4h 400/800 143.5kg 3K/2.5Ca TDCHeparin3000units IV TIW - Mircera171mgIVq2wks - last 12/8 - Venofer '100mg'$ IVqHD x10 -  completed 6/10 doses  Assessment/Plan: 1. History of prolonged hospitalization with COVID-19 and consequent debilitation/sacrococcygeal decubitus/osteomyelitis: Status post completion of intravenous antibiotics and ongoing inpatient rehabilitation. 2. Clostridium difficile associated diarrhea: on oral vancomycin with continued diarrhea and occasional nauseas. PRN imodium and zofran/compazine noted.   3. ESRD: HD TTS. Continue on schedule.  Next HD 2/22.  4. HTN/volume:  BP controlled. On amlodipine, hydralazine. No gross volume excess on exam. Minimal UF on HD.  5. Anemia: Hgb stable.  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 Continue Fosrenol binder.   7. Nutrition: Renal diet with fluid restriction. Cont prot supp for low albumin.   Lynnda Child PA-C [facility] 07/12/2020,9:59 AM

## 2020-07-12 NOTE — Progress Notes (Addendum)
Patient ID: Fred [name], male   DOB: 1971/01/25, 50 y.o.   MRN: [id number] Spoke with Administer from [facility] who has accepted pt back. Will need to talk with admissions and get his OP-HD set up again. Have reached out to [facility] who is off today. No other facility faxed information to or called have offered a bed for pt. Message left for sister-[name] and made pt aware of plan.  1:15 PM Spoke with [name]-Admissions to discuss transfer and arrangements that need to be in place before can happen. HD needs to be arranged and [name] will let worker know tomorrow. Hopefully can move toward Wed or Friday non-dailysis day.

## 2020-07-12 NOTE — Progress Notes (Signed)
Physical Therapy Session Note  [name] MRN: [id number] Date of Birth: 1970-10-19  Today's Date: 07/12/2020 PT Individual Time: 1430-1526 PT Individual Time Calculation (min): 56 min   Short Term Goals: Week 6:  PT Short Term Goal 1 (Week 6): pt to demonstrate to [facility] mobility at 73' CGA PT Short Term Goal 2 (Week 6): pt to demonstrate min A functional transfers consistently PT Short Term Goal 3 (Week 6): pt to be able to direct all care needs for transfer setup, equipment, and skin intregity  Skilled Therapeutic Interventions/Progress Updates:    pt received in bed and agreeable to therapy, reported 5/10 pain in stomach. Pt directed in supine>sit SBA and sat EOB for several minutes to adjust to position changes. Pt denied PT to don abdominal binder and BLE ace wraps. Pt directed in x4 Sit to stand from bedside to Rolling walker max A to achieve each stand and pt able to tolerate standing at Rolling walker min A for ~30s each time. Pt directed in standing slight marching however pt unable to complete and min A for sitting safely at EOB. Pt required prolonged sitting rest breaks post each stand 2/2 fatigue. Pt reported he had a BM in brief and directed in sit>supine SBA. Pt directed in rolling mod I for doffing and donning new brief, dependent for pericare. Pt then directed in pant management however declined to pull briefs over hips and requested to have them left down under hips, over knees. Pt reported slight dizziness after last stand but reported he greatly improved with returning to sit. Pt directed in supine BUE strengthening exercises of chest press, front raises, and bicep curls 3# 3x15. Pt then directed in BLE strengthening exercises of 3x15 single leg knees to chest, bridges, and supine clamshells. Pt left in supine, All needs in reach and in good condition. Call light in hand.    Therapy Documentation Precautions:  Precautions Precautions: Fall Precaution  Comments: wounds on bilat feet and L sacrum, inconsistent orthostatic BPs Required Braces or Orthoses: Other Brace Other Brace: abdominal binder PRN with standing/strenuous activity Restrictions Weight Bearing Restrictions: No General:   Vital Signs:  Pain:   Mobility:   Locomotion :    Trunk/Postural Assessment :    Balance:   Exercises:   Other Treatments:      Therapy/Group: Individual Therapy  [name] 07/12/2020, 3:28 PM

## 2020-07-12 NOTE — Progress Notes (Signed)
Physical Therapy Note  [name] MRN: [id number] Date of Birth: 1971-04-14 Today's Date: 07/12/2020    Pt's plan of care adjusted to 15/7 after speaking with care team and discussed with MD as pt currently unable to tolerate current therapy schedule with OT and PT.     [name] 07/12/2020, 12:35 PM

## 2020-07-12 NOTE — Progress Notes (Signed)
Occupational Therapy Weekly Progress Note  [name] MRN: [id number] Date of Birth: August 11, 1970  Beginning of progress report period: July 05, 2020 End of progress report period: July 12, 2020  Today's Date: 07/12/2020 OT Individual Time: 3151-7616 OT Individual Time Calculation (min): 60 min  and Today's Date: 07/12/2020 OT Missed Time: 15 Minutes Missed Time Reason: Patient fatigue   The pt did not have any STGs this week as he is staying longer than originally planned and is awaiting SNF placement. The pt has been significantly limited by fatigue and feeling ill 2/2 C Diff diagnosis. Pt is still performing bathing and dressing at bed level, able to now sit up for 20-30 minutes at a time to complete UB ADLs and strengthening but bed level for LB dressing and bathing.   Patient continues to demonstrate the following deficits: muscle weakness, decreased cardiorespiratoy endurance, impaired timing and sequencing, unbalanced muscle activation and decreased coordination and decreased sitting balance, decreased standing balance, decreased postural control and decreased balance strategies and therefore will continue to benefit from skilled OT intervention to enhance overall performance with BADL and Reduce care partner burden.  Patient is progressing toward LTGs at overall Min level in prep for SNF placement.  Continue plan of care.  OT Short Term Goals Week 4:  OT Short Term Goal 1 (Week 4): STGs=LTGs due to ELOS OT Short Term Goal 1 - Progress (Week 4): Progressing toward goal Week 5:  OT Short Term Goal 1 (Week 5): STGs=LTGs due to ELOS  Skilled Therapeutic Interventions/Progress Updates:    Pt greeted at time of session supine in bed resting saying he felt "terrible" but agreeable to try to participate in OT session. Pt declining ADL today to change clothes or wash up despite not having wash up in several days. Supine > sit Supervision with bed features and  sat EOB to eat breakfast (one orange) with set up. Washed face and oral hygiene with set up as well at EOB. Pt performed BUE there ex seated EOB with 3# dowel for 3x10-15 reps for bicep curl, chest press with added resistance, FWD and backward circles, and overhead press with rest breaks inbetween. Focus today as well on posture and preventing rounded shoulders during activity. [name] checked after sitting EOB for approx 45 mins and 128/88, no symptoms. Agreeable to try standing today, but declined to have binder or ACE wraps on. Sit > stand Max A from EOB (air mattress) and able to static stand Min A for 10 seconds before feeling dizzy and returned to supine Supervision, symptoms improving. Note discussion throughout session today regarding DC planning and continuing OT services pending placement. Pt feeling down, encouraged to participate for max benefit and emotional support provided. Call bell in reach all needs met.   Therapy Documentation Precautions:  Precautions Precautions: Fall Precaution Comments: wounds on bilat feet and L sacrum, inconsistent orthostatic BPs Required Braces or Orthoses: Other Brace Other Brace: abdominal binder PRN with standing/strenuous activity Restrictions Weight Bearing Restrictions: No     Therapy/Group: Individual Therapy  [name] 07/12/2020, 12:04 PM

## 2020-07-12 NOTE — Progress Notes (Signed)
[facility] PHYSICAL MEDICINE & REHABILITATION PROGRESS NOTE   Subjective/Complaints:  Guess "he's OK'- Said having 2-3 BMs/in last 24 hours- all loose/liquid. Still having abd cramping and soreness.   Did have episode of N/V yesterday evening per chart- has resolved Sx' wise per pt.    ROS:   Pt denies SOB, abd pain, CP, N/V/, and vision changes     Objective:   No results found. Recent Labs    07/10/20 1239 07/10/20 1653 07/11/20 1645 07/12/20 0611  WBC 9.1  --   --  10.8*  HGB 10.0*   < > 10.8* 10.7*  HCT 33.2*   < > 35.4* 34.3*  PLT 327  --   --  295   < > = values in this interval not displayed.   Recent Labs    07/10/20 1239 07/12/20 0611  NA 134* 136  K 4.6 4.1  CL 99 101  CO2 27 28  GLUCOSE 103* 83  BUN 29* 27*  CREATININE 5.75* 5.72*  CALCIUM 9.1 8.8*    Intake/Output Summary (Last 24 hours) at 07/12/2020 0842 Last data filed at 07/12/2020 0500 Gross per 24 hour  Intake 237 ml  Output 401 ml  Net -164 ml     Pressure Injury 05/01/20 Buttocks Left Stage 4 - Full thickness tissue loss with exposed bone, tendon or muscle. packed with santyl gauze, dressed by WOCN, (Active)  05/01/20 0230  Location: Buttocks  Location Orientation: Left  Staging: Stage 4 - Full thickness tissue loss with exposed bone, tendon or muscle.  Wound Description (Comments): packed with santyl gauze, dressed by WOCN,  Present on Admission: Yes     Pressure Injury 05/01/20 Ankle Left;Lateral Unstageable - Full thickness tissue loss in which the base of the injury is covered by slough (yellow, tan, gray, green or brown) and/or eschar (tan, brown or black) in the wound bed. smal circular wound on the (Active)  05/01/20 0230  Location: Ankle  Location Orientation: Left;Lateral  Staging: Unstageable - Full thickness tissue loss in which the base of the injury is covered by slough (yellow, tan, gray, green or brown) and/or eschar (tan, brown or black) in the wound bed.  Wound  Description (Comments): smal circular wound on the bone  Present on Admission: Yes     Pressure Injury 05/01/20 Foot Left;Lateral Unstageable - Full thickness tissue loss in which the base of the injury is covered by slough (yellow, tan, gray, green or brown) and/or eschar (tan, brown or black) in the wound bed. 4th and 5th toes appear nec (Active)  05/01/20 0230  Location: Foot (4th and 5th toes and lateral/anterior side of the sole)  Location Orientation: Left;Lateral  Staging: Unstageable - Full thickness tissue loss in which the base of the injury is covered by slough (yellow, tan, gray, green or brown) and/or eschar (tan, brown or black) in the wound bed.  Wound Description (Comments): 4th and 5th toes appear necrotic circular wound on sole  Present on Admission: Yes     Pressure Injury 05/01/20 Foot Right;Medial;Posterior Unstageable - Full thickness tissue loss in which the base of the injury is covered by slough (yellow, tan, gray, green or brown) and/or eschar (tan, brown or black) in the wound bed. large circular wo (Active)  05/01/20 0230  Location: Foot  Location Orientation: Right;Medial;Posterior  Staging: Unstageable - Full thickness tissue loss in which the base of the injury is covered by slough (yellow, tan, gray, green or brown) and/or eschar (tan, brown or  black) in the wound bed.  Wound Description (Comments): large circular wound on sole  and heel  Present on Admission: Yes     Pressure Injury 05/08/20 Heel Right Unstageable - Full thickness tissue loss in which the base of the injury is covered by slough (yellow, tan, gray, green or brown) and/or eschar (tan, brown or black) in the wound bed. (Active)  05/08/20 1718  Location: Heel  Location Orientation: Right  Staging: Unstageable - Full thickness tissue loss in which the base of the injury is covered by slough (yellow, tan, gray, green or brown) and/or eschar (tan, brown or black) in the wound bed.  Wound Description  (Comments):   Present on Admission: Yes    Physical Exam: Vital Signs Blood pressure (!) 149/77, pulse 99, temperature 98.9 F (37.2 C), resp. rate 18, height 6' 6" (1.981 m), weight 115 kg, SpO2 100 %. XHF:SFSELT supine in bed- maybe slightly off backside, but not significantly, asleep- woke slowly, NAD HEENT: oral mucosa pink and moist, NCAT Cardio: borderline  Tachycardia- regular rhythm Chest: CTA B/L- no W/R/R- good air movement Abd: soft, c/o "soreness with Palpation- diffusely- no rebound; hyperactive BS Extremities: edema in extremities.  No tenderness in extremities. Neuro: sleepy again Motor: Bilateral upper extremities: 5/5 proximal distal Bilateral lower extremities: 4-/5 proximal distal   Skin: Multiple foot wounds. L foot- has ruptured blood blister 5th MTP base; also 4th toe is dry gangrene- all black eschar. Large sacral wound- no change Feet wounds look the same- no significant improvement in healing since seen last time  Assessment/Plan: 1. Functional deficits which require 3+ hours per day of interdisciplinary therapy in a comprehensive inpatient rehab setting.  Physiatrist is providing close team supervision and 24 hour management of active medical problems listed below.  Physiatrist and rehab team continue to assess barriers to discharge/monitor patient progress toward functional and medical goals  Care Tool:  Bathing    Body parts bathed by patient: Right arm,Left arm,Chest,Abdomen,Face   Body parts bathed by helper: Front perineal area,Buttocks     Bathing assist Assist Level: Contact Guard/Touching assist     Upper Body Dressing/Undressing Upper body dressing   What is the patient wearing?: Pull over shirt    Upper body assist Assist Level: Supervision/Verbal cueing    Lower Body Dressing/Undressing Lower body dressing      What is the patient wearing?: Pants     Lower body assist Assist for lower body dressing: Minimal Assistance - Patient  > 75%     Toileting Toileting    Toileting assist Assist for toileting: Minimal Assistance - Patient > 75% Assistive Device Comment:  (urinal)   Transfers Chair/bed transfer  Transfers assist  Chair/bed transfer activity did not occur: Safety/medical concerns (Per nursing report)  Chair/bed transfer assist level: Minimal Assistance - Patient > 75% Chair/bed transfer assistive device: Sliding board,Armrests   Locomotion Ambulation   Ambulation assist   Ambulation activity did not occur: Safety/medical concerns (fatigue, lightheadedness, generalized weakness)  Assist level: Minimal Assistance - Patient > 75% Assistive device: Parallel bars Max distance: 24 ft   Walk 10 feet activity   Assist  Walk 10 feet activity did not occur: Safety/medical concerns (fatigue, lightheadedness, generalized weakness)  Assist level: Minimal Assistance - Patient > 75% Assistive device: Parallel bars   Walk 50 feet activity   Assist Walk 50 feet with 2 turns activity did not occur: Safety/medical concerns (fatigue, lightheadedness, generalized weakness)         Walk 150  feet activity   Assist Walk 150 feet activity did not occur: Safety/medical concerns (fatigue, lightheadedness, generalized weakness)         Walk 10 feet on uneven surface  activity   Assist Walk 10 feet on uneven surfaces activity did not occur: Safety/medical concerns (fatigue, lightheadedness, generalized weakness)         Wheelchair     Assist Will patient use wheelchair at discharge?: Yes Type of Wheelchair: Manual    Wheelchair assist level: Supervision/Verbal cueing Max wheelchair distance: 100    Wheelchair 50 feet with 2 turns activity    Assist        Assist Level: Supervision/Verbal cueing   Wheelchair 150 feet activity     Assist      Assist Level: Minimal Assistance - Patient > 75%   Blood pressure (!) 149/77, pulse 99, temperature 98.9 F (37.2 C), resp.  rate 18, height 6' 6" (1.981 m), weight 115 kg, SpO2 100 %.  Medical Problem List and Plan: 1.  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 Due to participation.  2.  Antithrombotics: -DVT/anticoagulation: Continue Subcutaneous heparin.               -antiplatelet therapy: N/A 3. Pain Management: Continue Tylenol as needed, last received 2/20 AM  2/11- denied pain- con't regimen  2/16- having abdominal pain but controlled with tylenol 4. Mood: Provide emotional support             -antipsychotic agents: N/A 5. Neuropsych: This patient is? capable of making decisions on his own behalf. 6. Skin/Wound Care: Routine skin checks 7. Fluids/Electrolytes/Nutrition: Routine in and outs with follow-up chemistries 8.  ESRD.  Hemodialysis per renal services 9.  Sacral decubitus ulcer as well as ischemic ulcers on the left foot fourth toe and fifth metatarsal head and right heel and fifth metatarsal head.  Wound care nurse follow-up.  Patient to see Dr. Sharol Given outpatient.  Continue bilateral PRAFO's. 10.  ID/ coccygeal osteomyelitis.    Completed vancomycin and cefepime on 06/10/2020  1/18- will schedule turning q2 hours to get off wounds- also will make sure on specialty bed  2/7- CRP 6.5- ESR pending - con't regimen  2/8- ESR  DOWN TO 67 FROM 73- SO HEADING IN RIGHT DIRECTION  2/9- Spiked temp last night of 100.9- restarted Vanc/Cefipime- pt refused Vanc "since causes diarrhea". WBC now normal again this AM- will con't for now. CXR (-); is anuric-  2/10- having fevers/sweats/chills- Tm 99.4- needs to also take Vanc- told this to pt- also, called ID and also ordered another CT of pelvis due to Sx's-    2/13 ID saw pt Friday. Thinks that low grade temps are coming from HD cath. Was not impressed by MRI of pelvis. Recommended stopping abx (d/t loose stool)   -continue to monitor   -continue [facility] per  nephrology   -afebrile last 36 hours  2/18- won't stay off backside- is not healing - even with encouragement, not turning off backside   2/21- laying slightly off backside this AM- not a lot.  10.  Anemia of chronic disease.  Continue Aranesp.   Hemoglobin 10.0 on 2/3, labs with HD 11.  Diabetes mellitus with peripheral neuropathy .  Hemoglobin A1c 5.6.  Lantus insulin 11 units nightly  CBG (last 3)  Recent Labs    07/11/20 1711 07/11/20 2056 07/12/20 0617  GLUCAP 123* 99 78  2/21- 78-123- con't regimen 12.  Morbid obesity.  BMI 38.37.  Dietary follow-up 13. Dizziness/nausea with HTN and hypotension  Vitals:   07/12/20 0525 07/12/20 0831  BP: (!) 157/87 (!) 149/77  Pulse: (!) 106 99  Resp: 18 18  Temp: 99.9 F (37.7 C) 98.9 F (37.2 C)  SpO2: 100% 100%    Hydralazine prn   Norvasc to 7.5 mg daily   2/9- BP 156/85 this AM- but has been controlled last few days  2/12-13-bps still with some fluctuation but not severe. Continue current meds, volume mgt per nephrology  2/15- BP's 150s-160s/90s- on Norvasc 7.5- can increase, but usually has orthostatic hypotension when we do. Will d/w Renal  2/16- "passed out" yesterday= will call renal   2/17- renal note shows they will not pull off fluid today in HD due to "passing out event" and diarrhea fluid losses  2/19- BP up to 148/101 last night- continue regimen per rena  2/21- BP 149/77- con't regimen 14. Diarrhea with new constipation  Bowel meds increased on 2/5  2/7- will decrease bowel meds again since 8+ BMs in last 24 hours- con't regimen  2/12 stool still mushy. Stopped miralax  2/14- will allow them to use the Imodium prn that's ordered- since still having so many stools- he does this from IV ABX  2/18- has C diff- off Imodium 15. Leukocytosis: Resolved  ESR/CRP and CBC with diff ordered for Monday.  2/7- CRP 6.5- stable- con't regimen   2/8- EST 67- down to 73- con't regimen  2/11- Labs looking slightly better, but  imaging does not- waiting to hear from ID  2/14- per ID, pelvis osteomyelitis not the issue- off ABX per their request 16. N/Vomiting-  2/10- likely due to underlying illness- will do ID w/u as above.  17. 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   2/16- abdominal pain slightly better  2/17- BMs less frequent per pt and less abd pain- con't regimen- will recheck labs   2/18- WBC down to 8.7- no Left shift- 1 watery BM today- is doing better- except had Tm 100.5 overnight- if spikes again, might need to call ID back.  2/21- No elevated WBC now- 2-3 loose/liquid BMs in last 24 hours.   16. Dispo  2/8- pt unhappy that has to back to SNF- however has been participating intermittently- finally doing more/more interactive, but we can only keep max of 30 days. Sent FL2  2/9- pt refused PT yesterday -only bedlevel Exercises- pt needs max encouragement to progress with therapy   2/10- might have to hold d/c- we will see.  2/11- might not be able to d/c Monday per SNF search/SW   2/15- told SW that cannot d/c right now due to C Diff.  2/21- will call sister tomorrow- cannot since going to clinic today.     LOS: 36 days A FACE TO FACE EVALUATION WAS PERFORMED    07/12/2020, 8:42 AM

## 2020-07-13 LAB — GLUCOSE, CAPILLARY
Glucose-Capillary: 103 mg/dL — ABNORMAL HIGH (ref 70–99)
Glucose-Capillary: 145 mg/dL — ABNORMAL HIGH (ref 70–99)

## 2020-07-13 LAB — HEMOGLOBIN AND HEMATOCRIT, BLOOD
HCT: 34.1 % — ABNORMAL LOW (ref 39.0–52.0)
HCT: 32.3 % — ABNORMAL LOW (ref 39.0–52.0)
Hemoglobin: 10.2 g/dL — ABNORMAL LOW (ref 13.0–17.0)
Hemoglobin: 9.7 g/dL — ABNORMAL LOW (ref 13.0–17.0)

## 2020-07-13 MED ORDER — LIDOCAINE-PRILOCAINE 2.5-2.5 % EX CREA
1.0000 "application " | TOPICAL_CREAM | CUTANEOUS | Status: DC | PRN
  Filled 2020-07-13: qty 5

## 2020-07-13 MED ORDER — CITALOPRAM HYDROBROMIDE 10 MG PO TABS
20.0000 mg | ORAL_TABLET | Freq: Every day | ORAL | Status: DC
  Administered 2020-07-13 – 2020-07-16 (×4): 20 mg via ORAL
  Filled 2020-07-13 (×4): qty 2

## 2020-07-13 MED ORDER — SODIUM CHLORIDE 0.9 % IV SOLN: 100.0000 mL | INTRAVENOUS | Status: DC | PRN

## 2020-07-13 MED ORDER — HEPARIN SODIUM (PORCINE) 1000 UNIT/ML DIALYSIS: 20.0000 [IU]/kg | INTRAMUSCULAR | Status: DC | PRN

## 2020-07-13 MED ORDER — DARBEPOETIN ALFA 100 MCG/0.5ML IJ SOSY
PREFILLED_SYRINGE | INTRAMUSCULAR | Status: AC
  Administered 2020-07-13: 100 ug
  Filled 2020-07-13: qty 0.5

## 2020-07-13 MED ORDER — HEPARIN SODIUM (PORCINE) 1000 UNIT/ML IJ SOLN
INTRAMUSCULAR | Status: AC
  Filled 2020-07-13: qty 3

## 2020-07-13 MED ORDER — VANCOMYCIN 50 MG/ML ORAL SOLUTION
500.0000 mg | Freq: Four times a day (QID) | ORAL | Status: AC
  Administered 2020-07-13 – 2020-07-15 (×9): 500 mg via ORAL
  Filled 2020-07-13 (×11): qty 10

## 2020-07-13 MED ORDER — ALTEPLASE 2 MG IJ SOLR: 2.0000 mg | Freq: Once | INTRAMUSCULAR | Status: DC | PRN

## 2020-07-13 MED ORDER — DARBEPOETIN ALFA 100 MCG/0.5ML IJ SOSY
100.0000 ug | PREFILLED_SYRINGE | INTRAMUSCULAR | Status: DC
  Filled 2020-07-13: qty 0.5

## 2020-07-13 MED ORDER — LIDOCAINE HCL (PF) 1 % IJ SOLN: 5.0000 mL | INTRAMUSCULAR | Status: DC | PRN

## 2020-07-13 MED ORDER — HEPARIN SODIUM (PORCINE) 1000 UNIT/ML DIALYSIS: 1000.0000 [IU] | INTRAMUSCULAR | Status: DC | PRN

## 2020-07-13 MED ORDER — PENTAFLUOROPROP-TETRAFLUOROETH EX AERO: 1.0000 "application " | INHALATION_SPRAY | CUTANEOUS | Status: DC | PRN

## 2020-07-13 NOTE — Patient Care Conference (Signed)
Inpatient RehabilitationTeam Conference and Plan of Care Update Date: 07/13/2020   Time: 11:35 AM    Patient Name: Fred [name]      Medical Record Number: [id number]  Date of Birth: November 25, 1970 Sex: Male         Room/Bed: 4M05C/4M05C-01 Payor Info: Payor: MEDICARE / Plan: MEDICARE PART A AND B / Product Type: *No Product type* /    Admit Date/Time:  06/06/2020  1:05 PM  Primary Diagnosis:  Critical illness myopathy  Hospital Problems: Principal Problem:   Critical illness myopathy Active Problems:   Pressure injury of sacral region, unstageable ([place])   Anemia of chronic disease   ESRD on dialysis ([place])   Diabetic peripheral neuropathy (HCC)   Labile blood glucose   Essential hypertension   Constipation   Fever   Diarrhea   History of Clostridioides difficile colitis    Expected Discharge Date: Expected Discharge Date:  (SNF)  Team Members Present: Physician leading conference: Dr. Courtney Heys Care Coodinator Present: Dorthula Nettles, RN, BSN, CRRN;Becky Dupree, LCSW Nurse Present: Rayne Du, LPN PT Present: Stacy Gardner, PT OT Present: Lillia Corporal, OT PPS Coordinator present : Gunnar Fusi, SLP     Current Status/Progress Goal Weekly Team Focus  Bowel/Bladder   pt oliguric; incontient of bowel, LBM: 02/21  Pt will regain full continence x2  assist QS and prn   Swallow/Nutrition/ Hydration             ADL's   UB ADLs set up/supervision EOB, LB dress/bathe Min A, incontinent with dependent brief changes, bed mobility Supervision, has not gotten OOB recently  Min sit to stands, Min LB bathe, CGA/S slide board transfers in prep for SNF  general conditioning, sitting balance/core strength, any OOB activity, sit to stands, transfers   Mobility   SBA bed mob grossly; ~mod A STS with RW; tolerating standing 15-45s with RW min A-CGA static  mod I WC level, increased tolerance OOB in sitting upright; CGA standing and gait  inc tolerance to OOB and standing;  strengthening; transfer training, gait training, WC mobility   Communication             Safety/Cognition/ Behavioral Observations            Pain   no c/o pain  remain pain free  assess pain QS and prn   Skin   pt has stg IV pressure ucler with QS dressing change; bil heel dressings  Skin will continue to heal and remain free from new breakdown/infection  assess skin QS and prn     Discharge Planning:  [name] has offered a bed for him to return, working out OP-HD and when bed available for pt to transfer-hopefully this week   Team Discussion: Stools are getting looser, IP says to increase Vanc. Sister says patient is getting depressed. Adding Celexa. Suggested bowel program and he was in agreement. Sister reported that his previous diagnosis of C-diff took 3-4 weeks of treatment. Patient reports no pain, completing daily dressing changes, still incontinent of stool. [name] will take him back and he has availability at same location for HD.  Patient on target to meet rehab goals: He is back to where is was before the C-diff diagnosis in therapy. Supervision for bed mobility, Mod I for in bed activities. He is participating more. Disappointed about not getting closer to home. Wants to hear more about the Covid vaccine. Barrier to being out of bed for longer periods of time is that when he's ready to  go back to bed nobody will help him.  *See Care Plan and progress notes for long and short-term goals.   Revisions to Treatment Plan:  MD added Celexa, increase Vanc. Dosage.  Teaching Needs: Family education, medication management, skin/wound care, transfer training, gait training, ADL training, bowel program training.  Current Barriers to Discharge: Inaccessible home environment, Decreased caregiver support, Home enviroment access/layout, Incontinence, Neurogenic bowel and bladder, Wound care, Lack of/limited family support, Weight, Hemodialysis, Medication compliance and  Behavior  Possible Resolutions to Barriers: Continue current medications, document bowel program results, provide emotional support to patient and family.     Medical Summary Current Status: bowel incontinence-   no pain; depression; daily dressing changes heels/sacrum- won't stay off backside  Barriers to Discharge: Behavior;Decreased family/caregiver support;Home enviroment access/layout;Hemodialysis;Incontinence;Neurogenic Bowel & Bladder;Medical stability;Wound care;Medication compliance;Other (comments)  Barriers to Discharge Comments: [name] will take him back- seeing if can take with C diff.-/C Diff; fearful about getting back in bed/from recliner; transfers. Possible Resolutions to [employer]: Added celexa- suggested bowel program- might be interested in it; interested in COVID vaccine -hearing about it- doing more with therapy-focus on OOB and placement; as well as C Diff tx. d/c date- SNF placement   Continued Need for Acute Rehabilitation Level of Care: The patient requires daily medical management by a physician with specialized training in physical medicine and rehabilitation for the following reasons: Direction of a multidisciplinary physical rehabilitation program to maximize functional independence : Yes Medical management of patient stability for increased activity during participation in an intensive rehabilitation regime.: Yes Analysis of laboratory values and/or radiology reports with any subsequent need for medication adjustment and/or medical intervention. : Yes   I attest that I was present, lead the team conference, and concur with the assessment and plan of the team.   Cristi Loron 07/13/2020, 12:41 PM

## 2020-07-13 NOTE — Progress Notes (Signed)
Occupational Therapy Session Note  [name] MRN: [id number] Date of Birth: Dec 10, 1970  Today's Date: 07/13/2020 OT Individual Time: 1002-1056 OT Individual Time Calculation (min): 54 min    Short Term Goals: Week 4:  OT Short Term Goal 1 (Week 4): STGs=LTGs due to ELOS OT Short Term Goal 1 - Progress (Week 4): Progressing toward goal Week 5:  OT Short Term Goal 1 (Week 5): STGs=LTGs due to ELOS  Skilled Therapeutic Interventions/Progress Updates:    Pt greeted at time of session supine in bed meeting with PA from Nephrology. After pt agreeable to OT session with encouragement, able to recall what he did this morning with PT. Agreeable to ADL with encouragement as planned yseterday. Pt noted to have soiled brief, stating he had had BM a while ago and encouraged pt to call staff when he has a BM to prevent skin break down. Dependent brief change at bed level with hygiene and pt rolling L/R with supervision to assist. Supine > sit Supervision with bed features and sat EOB to eat breakfast (one orange) prior to performing bathing/dressing tasks. UB bathe/dress set up supervision and oral hygiene in same manner. LB bathe with Min A for buttocks at bed level and donned pants with Min A pt threading sitting EOB and needed assist to get feet in correct leg. Pt feeling "down" today stating he wants to go home, educated on level of assist needed to be at in order to go home, and encouraged to maximize participation. Pt in bed with call bell in reach all needs met.   Therapy Documentation Precautions:  Precautions Precautions: Fall Precaution Comments: wounds on bilat feet and L sacrum, inconsistent orthostatic BPs Required Braces or Orthoses: Other Brace Other Brace: abdominal binder PRN with standing/strenuous activity Restrictions Weight Bearing Restrictions: No     Therapy/Group: Individual Therapy  [name] 07/13/2020, 10:26 AM

## 2020-07-13 NOTE — Progress Notes (Signed)
Physical Therapy Session Note  [name] MRN: [id number] Date of Birth: 06/09/1970  Today's Date: 07/13/2020 PT Individual Time: [phone] and 1100-1137 PT Individual Time Calculation (min): 56 min and 27 mins  Short Term Goals: Week 6:  PT Short Term Goal 1 (Week 6): pt to demonstrate to [facility] mobility at 53' CGA PT Short Term Goal 2 (Week 6): pt to demonstrate min A functional transfers consistently PT Short Term Goal 3 (Week 6): pt to be able to direct all care needs for transfer setup, equipment, and skin intregity  Skilled Therapeutic Interventions/Progress Updates:    pt received in bed and agreeable to therapy. Pt reported 10/10 pain in stomach and intestines. Pt agreeable to attempt EOB sitting but reported "I don't know how I'll do." pt directed in supine>sit EOB extra time required and bedrails at SBA. Pt refused abdominal binder and BLE ace wraps to be put on throughout therapy reporting they make him feel sicker. Pt directed in dynamic sitting with reaching in various directions in and outside of BOS for improved balance righting and core strengthening and tolerance to activity in sitting. Pt then agreeable to attempt Sit to stand from bedside to Rolling walker, initially pt attempted to scoot to EOB and attempt standing without walker in place and educated to always have walker when attempting to stand, pt setup for transfer training, mod A for x2 Sit to stand with extra time required to achieve full upright standing VC for hand placement, glute activation, trunk extension, and improved head carriage. Pt then required prolonged rest break between reps 2/2 fatigue. After second rep, pt reported he needed to lay down to rest. Denied dizziness. Sit>supine SBA. After rest break pt directed in supine>sit SBA, sat EOB CGA with slight trunk sway, then directed in x2 Sit to stand to Rolling walker at mod A to achieve full standing, pt cleared bed at minA. Pt then returned  to supine, SBA and requested to remain there at end of session. Pt refused transfers to [facility] and recliner or to put bed in chair position. Pt educated extensively on importance of mobility, OOB tolerance, transfer training, re-initiating gait training, and increased standing tolerance. Pt reported he understanding but states, "When I feel better and have more energy I will be able to do that." pt educated that currently he has not be able to tolerate sitting OOB for any periods of time which limits progression with this. Pt again reports he understands but feels too sick to transfer to chair or WC.  Pt left in bed, All needs in reach and in good condition. Call light in hand.    Session 2: pt received in bed and agreeable to therapy. Pt reported 7/10 stomach pain but reported he felt better than this AM. Pt directed in supine>sit SBA; sat EOB SBA throughout session. Pt directed in x5 Sit to stand from bed to Rolling walker at mod A one rep of min A with extra time to achieve standing. Pt directed in standing marching x10 marches with each stand for improved tolerance to standing activity. PT educated pt on importance of OOB sitting tolerance, max encouragement given to attempt to promote this. Pt denied multiple times to Stand pivot transfer to recliner. discussed with nursing to attempt to promote timed assistance back to be and nursing agreeable however pt declined. Pt left sitting EOB with nursing present for medications. Pt handed off to nursing in good condition.   Therapy Documentation Precautions:  Precautions  Precautions: Fall Precaution Comments: wounds on bilat feet and L sacrum, inconsistent orthostatic BPs Required Braces or Orthoses: Other Brace Other Brace: abdominal binder PRN with standing/strenuous activity Restrictions Weight Bearing Restrictions: No General:   Vital Signs:  Pain: Pain Assessment Pain Scale: 0-10 Pain Score: 0-No pain Mobility:   Locomotion :     Trunk/Postural Assessment :    Balance:   Exercises:   Other Treatments:      Therapy/Group: Individual Therapy  [name] 07/13/2020, 11:42 AM

## 2020-07-13 NOTE — Progress Notes (Signed)
Renal Navigator appreciates update from CIR CSW/B. Dupree that patient will be returning to [facility] and, therefore, need to confirm patient's HD clinic seat at [place] contacted [facility] and patient has a TTS schedule with a seat time of 11:30am. He needs to arrive at 11:10am. This is the same seat time as before this extended hospitalization and rehab stay. Navigator passed this on to CIR CSW.  Navigator will inform HD clinic of patient's discharge date when it has been set.   Alphonzo Cruise, [place] Renal Navigator [phone]

## 2020-07-13 NOTE — Progress Notes (Signed)
Nutrition Follow-up  DOCUMENTATION CODES:   Not applicable  INTERVENTION:   -Continue renal MVI daily  -Continue NeproShake po TID, each supplement provides 425 kcal and 19 grams protein  -Continue double protein portions at each meal  - Continue Greek yogurt TID with meals  - Encourage adequate PO intake  NUTRITION DIAGNOSIS:   Increased nutrient needs related to wound healing as evidenced by estimated needs.  Ongoing  GOAL:   Patient will meet greater than or equal to 90% of their needs  Progressing  MONITOR:   PO intake,Supplement acceptance,Labs,Weight trends,Skin,I & O's  REASON FOR ASSESSMENT:   Malnutrition Screening Tool    ASSESSMENT:   50 year old right-handed male with PMH of HTN, DM, ESRD on HD. Pt admitted with critical illness/mypoathy/debilitation secondary to COVID-19.  Per notes, plan is for pt to d/c to SNF.  Last HD was on 2/19 with 500 ml net UF. Post-HD weight was 115 kg.  Spoke with pt at bedside. Pt reports nausea is improving. He states that he did vomit yesterday morning but no vomiting since that time. Pt reports that he is drinking the Nepro shakes. He states that he hasn't been brought one today. RD provided pt with a Nepro shake at time of visit.  Pt completed 100% of breakfast but this only included [place] yogurt and an orange. RD again discussed with pt the importance of ordering more protein sources at meals. Pt states that he still does not like the way that the meat tastes. Pt mostly eating grilled cheese sandwiches. Encouraged pt to add protein like [place] or deli ham to grilled cheese sandwich. Pt agreeable.  Meal Completion: 0-100% x last 8 documented meals  Medications reviewed and include: aranesp, Nepro shake TID, SSI, lantus 11 units daily, fosrenol TID, rena-vit, protonix, IV venofer  Labs reviewed. CBG's: 96-145 x 24 hours  UOP: 400 ml x 24 hours  Diet Order:   Diet Order            Diet Carb Modified  Fluid consistency: Thin; Room service appropriate? Yes  Diet effective now                 EDUCATION NEEDS:   No education needs have been identified at this time  Skin:  Skin Assessment: Skin Integrity Issues: Stage IV: left buttock Unstageable: left ankle, left foot, right foot, right heel  Last BM:  07/13/20 multiple type 7  Height:   Ht Readings from Last 1 Encounters:  06/06/20 '6\' 6"'$  (1.981 m)    Weight:   Wt Readings from Last 1 Encounters:  07/10/20 115 kg    Ideal Body Weight:  97.3 kg  BMI:  Body mass index is 29.3 kg/m.  Estimated Nutritional Needs:   Kcal:  2500-2700  Protein:  145-165 grams  Fluid:  1000 ml + UOP    Gustavus Bryant, MS, RD, LDN Inpatient Clinical Dietitian Please see AMiON for contact information.

## 2020-07-13 NOTE — Progress Notes (Signed)
[facility] Progress Note   Subjective:   Patient seen and examined at bedside.  Diarrhea worsened again.  Otherwise feeling ok. Denies CP, SOB, dizziness and vomiting.    Objective Vitals:   07/12/20 1142 07/12/20 1541 07/12/20 1947 07/13/20 0510  BP: (!) 144/92 (!) 155/102 (!) 137/96 (!) 146/76  Pulse: (!) 116 97 (!) 106 96  Resp: '18 16 18 18  '$ Temp: 98.4 F (36.9 C) 98.5 F (36.9 C) 98 F (36.7 C) 99 F (37.2 C)  TempSrc:   Oral Oral  SpO2: 100% 99% 100% 97%  Weight:      Height:       Physical Exam General:Well developed, ill appearing male in NAD Heart:RRR, no MRG Lungs:CTAB, nml WOB Abdomen:soft, NND Extremities:no LE edema Dialysis Access: [facility] c/d/i  Filed Weights   07/08/20 1630 07/10/20 1245 07/10/20 1530  Weight: 115.5 kg 116 kg 115 kg    Intake/Output Summary (Last 24 hours) at 07/13/2020 1209 Last data filed at 07/13/2020 0530 Gross per 24 hour  Intake 240 ml  Output 401 ml  Net -161 ml    Additional Objective Labs: Basic Metabolic Panel: Recent Labs  Lab 07/08/20 1322 07/10/20 1239 07/12/20 0611  NA 132* 134* 136  K 5.1 4.6 4.1  CL 98 99 101  CO2 '25 27 28  '$ GLUCOSE 138* 103* 83  BUN 47* 29* 27*  CREATININE 6.55* 5.75* 5.72*  CALCIUM 9.1 9.1 8.8*  PHOS 4.4 4.2 4.2   Liver Function Tests: Recent Labs  Lab 07/08/20 1322 07/10/20 1239 07/12/20 0611  ALBUMIN 2.1* 1.9* 1.9*   CBC: Recent Labs  Lab 07/08/20 1322 07/09/20 0509 07/09/20 1656 07/10/20 1239 07/10/20 1653 07/12/20 0611 07/12/20 1628 07/13/20 0518  WBC 8.3 8.7  --  9.1  --  10.8*  --   --   NEUTROABS  --  5.3  --   --   --  7.7  --   --   HGB 9.9* 10.2*   < > 10.0*   < > 10.7* 10.6* 10.2*  HCT 32.1* 33.8*   < > 33.2*   < > 34.3* 32.9* 34.1*  MCV 82.9 83.0  --  83.8  --  82.7  --   --   PLT 295 292  --  327  --  295  --   --    < > = values in this interval not displayed.   Blood Culture    Component Value Date/Time   SDES SACRAL 06/30/2020 0013    SPECREQUEST NONE 06/30/2020 0013   CULT MULTIPLE ORGANISMS PRESENT, NONE PREDOMINANT (A) 06/30/2020 0013   REPTSTATUS 07/02/2020 FINAL 06/30/2020 0013   CBG: Recent Labs  Lab 07/12/20 1141 07/12/20 1643 07/12/20 2108 07/13/20 0626 07/13/20 1124  GLUCAP 104* 96 138* 103* 145*   Medications: . iron sucrose 100 mg (07/10/20 1511)   . amLODipine  5 mg Oral QHS  . Chlorhexidine Gluconate Cloth  6 each Topical BID  . Chlorhexidine Gluconate Cloth  6 each Topical Q0600  . citalopram  20 mg Oral Daily  . collagenase   Topical Daily  . darbepoetin (ARANESP) injection - DIALYSIS  200 mcg Intravenous Q Tue-HD  . feeding supplement (NEPRO CARB STEADY)  237 mL Oral TID BM  . fluticasone  2 spray Each Nare Daily  . heparin injection (subcutaneous)  7,500 Units Subcutaneous Q8H  . insulin aspart  0-9 Units Subcutaneous TID WC  . insulin glargine  11 Units Subcutaneous QHS  .  lanthanum  500 mg Oral TID WC  . multivitamin  1 tablet Oral QHS  . pantoprazole  40 mg Oral Q0600  . pneumococcal 23 valent vaccine  0.5 mL Intramuscular Tomorrow-1000  . vancomycin  500 mg Oral QID    Dialysis Orders: [facility] 4h 400/800 143.5kg 3K/2.5Ca TDCHeparin3000units IV TIW - Mircera151mgIVq2wks - last 12/8 - Venofer '100mg'$ IVqHD x10 - completed 6/10 doses  Assessment/Plan: 1. History of prolonged hospitalization with COVID-19 and consequent debilitation/sacrococcygeal decubitus/osteomyelitis:Status post completion of Vanc/Cefepime (06/10/20).  In CIR. To hopefully d/c home some point this week.  2. Clostridium difficile associated diarrhea: on oral vancomycin with continued diarrhea and occasional nausea. PRN imodium and zofran/compazine noted.  3. ESRD: HD TTS. Continue on schedule.  Next HD today. 4. HTN/volume:  BP controlled. On amlodipine.  Hydralazine ordered prn but has not needed. Some issues throughout stay with orthostatic hypotension.  Seems improved.  No gross volume excess on exam.  Minimal UF on HD.  5. Anemia: Hgb stable at 10.2.  IV iron ordered. Last ESA 2032m aranesp on 2/15, will lower dose.    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 Fosrenol has been held for last several days, restart if phos starts to climb.     7. Nutrition: Renal diet with fluid restriction. Cont prot supp for low albumin.  8. DMT2 - per admit  LiJen MowPA-C CaMarine/22/2022,12:09 PM  LOS: 37 days

## 2020-07-13 NOTE — Progress Notes (Signed)
Patient ID: Fred [name], male   DOB: Feb 23, 1971, 50 y.o.   MRN: [id number] Spoke with [name] who reports pt has same OP-HD spot at [facility]. Pt aware and spoke with sister-melissa who reports Dad and niece coming tomorrow, have called front desk to allow in. Message left for [name] for when would have bed. Awaiting return call.

## 2020-07-13 NOTE — Progress Notes (Addendum)
[facility] MEDICINE [facility] RE[facility]ABILITATION PROGRESS NOTE   Subjective/Complaints:  Guess "I'm good". Fell asleep around 2-3 am- mind racing.  Had 5+ BMs yesterday per pt.    ROS:   Pt denies SOB, abd pain, CP, N/V/C/D, and vision changes    Objective:   No results found. Recent Labs    07/10/20 1239 07/10/20 1653 07/12/20 0611 07/12/20 1628 07/13/20 0518  WBC 9.1  --  10.8*  --   --   HGB 10.0*   < > 10.7* 10.6* 10.2*  HCT 33.2*   < > 34.3* 32.9* 34.1*  PLT 327  --  295  --   --    < > = values in this interval not displayed.   Recent Labs    07/10/20 1239 07/12/20 0611  NA 134* 136  K 4.6 4.1  CL 99 101  CO2 27 28  GLUCOSE 103* 83  BUN 29* 27*  CREATININE 5.75* 5.72*  CALCIUM 9.1 8.8*    Intake/Output Summary (Last 24 hours) at 07/13/2020 0839 Last data filed at 07/13/2020 0530 Gross per 24 hour  Intake 480 ml  Output 401 ml  Net 79 ml     Pressure Injury 05/01/20 Buttocks Left Stage 4 - Full thickness tissue loss with exposed bone, tendon or muscle. packed with santyl gauze, dressed by WOCN, (Active)  05/01/20 0230  Location: Buttocks  Location Orientation: Left  Staging: Stage 4 - Full thickness tissue loss with exposed bone, tendon or muscle.  Wound Description (Comments): packed with santyl gauze, dressed by WOCN,  Present on Admission: Yes     Pressure Injury 05/01/20 Ankle Left;Lateral Unstageable - Full thickness tissue loss in which the base of the injury is covered by slough (yellow, tan, gray, green or brown) and/or eschar (tan, brown or black) in the wound bed. smal circular wound on the (Active)  05/01/20 0230  Location: Ankle  Location Orientation: Left;Lateral  Staging: Unstageable - Full thickness tissue loss in which the base of the injury is covered by slough (yellow, tan, gray, green or brown) and/or eschar (tan, brown or black) in the wound bed.  Wound Description (Comments): smal circular wound on the bone  Present on  Admission: Yes     Pressure Injury 05/01/20 Foot Left;Lateral Unstageable - Full thickness tissue loss in which the base of the injury is covered by slough (yellow, tan, gray, green or brown) and/or eschar (tan, brown or black) in the wound bed. 4th and 5th toes appear nec (Active)  05/01/20 0230  Location: Foot (4th and 5th toes and lateral/anterior side of the sole)  Location Orientation: Left;Lateral  Staging: Unstageable - Full thickness tissue loss in which the base of the injury is covered by slough (yellow, tan, gray, green or brown) and/or eschar (tan, brown or black) in the wound bed.  Wound Description (Comments): 4th and 5th toes appear necrotic circular wound on sole  Present on Admission: Yes     Pressure Injury 05/01/20 Foot Right;Medial;Posterior Unstageable - Full thickness tissue loss in which the base of the injury is covered by slough (yellow, tan, gray, green or brown) and/or eschar (tan, brown or black) in the wound bed. large circular wo (Active)  05/01/20 0230  Location: Foot  Location Orientation: Right;Medial;Posterior  Staging: Unstageable - Full thickness tissue loss in which the base of the injury is covered by slough (yellow, tan, gray, green or brown) and/or eschar (tan, brown or black) in the wound bed.  Wound Description (Comments):  large circular wound on sole  and heel  Present on Admission: Yes     Pressure Injury 05/08/20 Heel Right Unstageable - Full thickness tissue loss in which the base of the injury is covered by slough (yellow, tan, gray, green or brown) and/or eschar (tan, brown or black) in the wound bed. (Active)  05/08/20 1718  Location: Heel  Location Orientation: Right  Staging: Unstageable - Full thickness tissue loss in which the base of the injury is covered by slough (yellow, tan, gray, green or brown) and/or eschar (tan, brown or black) in the wound bed.  Wound Description (Comments):   Present on Admission: Yes    Physical Exam: Vital  Signs Blood pressure (!) 146/76, pulse 96, temperature 99 F (37.2 C), temperature source Oral, resp. rate 18, height 6' 6"  (1.981 m), weight 115 kg, SpO2 97 %. Gen: HEENT: oral mucosa pink and moist, NCAT Cardio: RRR- no JVD Chest: CTA B/L- no W/R/R- good air movement- little cough heard Abd: soft, diffusely/mild TTP- nondistended, normoactive BS Extremities: edema in extremities.  No tenderness in extremities. Neuro: still sleepy.  Motor: Bilateral upper extremities: 5/5 proximal distal Bilateral lower extremities: 4-/5 proximal distal   Skin: Multiple foot wounds. L foot- has ruptured blood blister 5th MTP base; also 4th toe is dry gangrene- all black eschar. Large sacral wound- no change Feet wounds look the same- no significant improvement in healing since seen last time  Assessment/Plan: 1. Functional deficits which require 3+ hours per day of interdisciplinary therapy in a comprehensive inpatient rehab setting.  Physiatrist is providing close team supervision and 24 hour management of active medical problems listed below.  Physiatrist and rehab team continue to assess barriers to discharge/monitor patient progress toward functional and medical goals  Care Tool:  Bathing    Body parts bathed by patient: Right arm,Left arm,Chest,Abdomen,Face   Body parts bathed by helper: Front perineal area,Buttocks     Bathing assist Assist Level: Contact Guard/Touching assist     Upper Body Dressing/Undressing Upper body dressing   What is the patient wearing?: Pull over shirt    Upper body assist Assist Level: Supervision/Verbal cueing    Lower Body Dressing/Undressing Lower body dressing      What is the patient wearing?: Pants     Lower body assist Assist for lower body dressing: Minimal Assistance - Patient > 75%     Toileting Toileting    Toileting assist Assist for toileting: Minimal Assistance - Patient > 75% Assistive Device Comment:  (urinal)    Transfers Chair/bed transfer  Transfers assist  Chair/bed transfer activity did not occur: Safety/medical concerns (Per nursing report)  Chair/bed transfer assist level: Minimal Assistance - Patient > 75% Chair/bed transfer assistive device: Sliding board,Armrests   Locomotion Ambulation   Ambulation assist   Ambulation activity did not occur: Safety/medical concerns (fatigue, lightheadedness, generalized weakness)  Assist level: Minimal Assistance - Patient > 75% Assistive device: Parallel bars Max distance: 24 ft   Walk 10 feet activity   Assist  Walk 10 feet activity did not occur: Safety/medical concerns (fatigue, lightheadedness, generalized weakness)  Assist level: Minimal Assistance - Patient > 75% Assistive device: Parallel bars   Walk 50 feet activity   Assist Walk 50 feet with 2 turns activity did not occur: Safety/medical concerns (fatigue, lightheadedness, generalized weakness)         Walk 150 feet activity   Assist Walk 150 feet activity did not occur: Safety/medical concerns (fatigue, lightheadedness, generalized weakness)  Walk 10 feet on uneven surface  activity   Assist Walk 10 feet on uneven surfaces activity did not occur: Safety/medical concerns (fatigue, lightheadedness, generalized weakness)         Wheelchair     Assist Will patient use wheelchair at discharge?: Yes Type of Wheelchair: Manual    Wheelchair assist level: Supervision/Verbal cueing Max wheelchair distance: 100    Wheelchair 50 feet with 2 turns activity    Assist        Assist Level: Supervision/Verbal cueing   Wheelchair 150 feet activity     Assist      Assist Level: Minimal Assistance - Patient > 75%   Blood pressure (!) 146/76, pulse 96, temperature 99 F (37.2 C), temperature source Oral, resp. rate 18, height 6' 6"  (1.981 m), weight 115 kg, SpO2 97 %.  Medical Problem List and Plan: 1.  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 Due to participation.  2.  Antithrombotics: -DVT/anticoagulation: Continue Subcutaneous heparin.               -antiplatelet therapy: N/A 3. Pain Management: Continue Tylenol as needed, last received 2/20 AM  2/11- denied pain- con't regimen  2/16- having abdominal pain but controlled with tylenol 4. Mood: Provide emotional support             -antipsychotic agents: N/A 5. Neuropsych: This patient is? capable of making decisions on his own behalf. 6. Skin/Wound Care: Routine skin checks 7. Fluids/Electrolytes/Nutrition: Routine in and outs with follow-up chemistries 8.  ESRD.  Hemodialysis per renal services 9.  Sacral decubitus ulcer as well as ischemic ulcers on the left foot fourth toe and fifth metatarsal head and right heel and fifth metatarsal head.  Wound care nurse follow-up.  Patient to see Dr. Sharol Given outpatient.  Continue bilateral PRAFO's. 10.  ID/ coccygeal osteomyelitis.    Completed vancomycin and cefepime on 06/10/2020  1/18- will schedule turning q2 hours to get off wounds- also will make sure on specialty bed  2/7- CRP 6.5- ESR pending - con't regimen  2/8- ESR  DOWN TO 67 FROM 73- SO HEADING IN RIGHT DIRECTION  2/9- Spiked temp last night of 100.9- restarted Vanc/Cefipime- pt refused Vanc "since causes diarrhea". WBC now normal again this AM- will con't for now. CXR (-); is anuric-  2/10- having fevers/sweats/chills- Tm 99.4- needs to also take Vanc- told this to pt- also, called ID and also ordered another CT of pelvis due to Sx's-    2/13 ID saw pt Friday. Thinks that low grade temps are coming from HD cath. Was not impressed by MRI of pelvis. Recommended stopping abx (d/t loose stool)   -continue to monitor   -continue [facility] per nephrology   -afebrile last 36 hours  2/18- won't stay off backside- is not healing - even with encouragement, not turning off  backside   2/22- laying supine again 10.  Anemia of chronic disease.  Continue Aranesp.   Hemoglobin 10.0 on 2/3, labs with HD 11.  Diabetes mellitus with peripheral neuropathy .  Hemoglobin A1c 5.6.  Lantus insulin 11 units nightly  CBG (last 3)  Recent Labs    07/12/20 1643 07/12/20 2108 07/13/20 0626  GLUCAP 96 138* 103*    2/22- BGs 96-138- con't regimen 12.  Morbid obesity.  BMI 38.37.  Dietary follow-up 13. Dizziness/nausea with HTN and hypotension  Vitals:   07/12/20 1947 07/13/20  0510  BP: (!) 137/96 (!) 146/76  Pulse: (!) 106 96  Resp: 18 18  Temp: 98 F (36.7 C) 99 F (37.2 C)  SpO2: 100% 97%    Hydralazine prn   Norvasc to 7.5 mg daily   2/9- BP 156/85 this AM- but has been controlled last few days  2/12-13-bps still with some fluctuation but not severe. Continue current meds, volume mgt per nephrology  2/15- BP's 150s-160s/90s- on Norvasc 7.5- can increase, but usually has orthostatic hypotension when we do. Will d/w Renal  2/16- "passed out" yesterday= will call renal   2/17- renal note shows they will not pull off fluid today in HD due to "passing out event" and diarrhea fluid losses  2/22- BP 130s-140s/70s-90s- con't regimen 14. Diarrhea with new constipation  Bowel meds increased on 2/5  2/7- will decrease bowel meds again since 8+ BMs in last 24 hours- con't regimen  2/12 stool still mushy. Stopped miralax  2/14- will allow them to use the Imodium prn that's ordered- since still having so many stools- he does this from IV ABX  2/18- has C diff- off Imodium 15. Leukocytosis: Resolved  ESR/CRP and CBC with diff ordered for Monday.  2/7- CRP 6.5- stable- con't regimen   2/8- EST 67- down to 73- con't regimen  2/11- Labs looking slightly better, but imaging does not- waiting to hear from ID  2/14- per ID, pelvis osteomyelitis not the issue- off ABX per their request 16. N/Vomiting-  2/10- likely due to underlying illness- will do ID w/u as above.  17. C  Diff  2/15- has new C Diff with hx of C Diff- also has bowel incontinence due to sphincter issues- will start PO Vanc for him- d/w pharmacy- same dose.   2/16- abdominal pain slightly better  2/17- BMs less frequent per pt and less abd pain- con't regimen- will recheck labs   2/18- WBC down to 8.7- no Left shift- 1 watery BM today- is doing better- except had Tm 100.5 overnight- if spikes again, might need to call ID back.  2/21- No elevated WBC now- 2-3 loose/liquid BMs in last 24 hours.   2/22- 5+ loose/liquid BMs in last 24 hours- will check with ID to see if should increase Vanc dose, any other ideas- was dx'd 1 week ago.   16. Dispo  2/8- pt unhappy that has to back to SNF- however has been participating intermittently- finally doing more/more interactive, but we can only keep max of 30 days. Sent FL2  2/9- pt refused PT yesterday -only bedlevel Exercises- pt needs max encouragement to progress with therapy   2/10- might have to hold d/c- we will see.  2/11- might not be able to d/c Monday per SNF search/SW   2/15- told SW that cannot d/c right now due to C Diff.  2/21- will call sister tomorrow- cannot since going to clinic today.   17. Depression  2/22- d/w sister- will try Celexa 20 mg daily  To help since doesn't want to do therapy/participate.   2/22- Called sister about medical issues. Discussed in depth - for an additional 20 minutes. Total 35 minutes on care- >50% coordination of care.      LOS: 37 days A FACE TO FACE EVALUATION WAS PERFORMED  Fred [name] 07/13/2020, 8:39 AM

## 2020-07-14 LAB — CBC WITH DIFFERENTIAL/PLATELET
Abs Immature Granulocytes: 0.07 10*3/uL (ref 0.00–0.07)
Basophils Absolute: 0 10*3/uL (ref 0.0–0.1)
Basophils Relative: 0 %
Eosinophils Absolute: 0.2 10*3/uL (ref 0.0–0.5)
Eosinophils Relative: 2 %
HCT: 32.4 % — ABNORMAL LOW (ref 39.0–52.0)
Hemoglobin: 9.7 g/dL — ABNORMAL LOW (ref 13.0–17.0)
Immature Granulocytes: 1 %
Lymphocytes Relative: 19 %
Lymphs Abs: 1.7 10*3/uL (ref 0.7–4.0)
MCH: 24.6 pg — ABNORMAL LOW (ref 26.0–34.0)
MCHC: 29.9 g/dL — ABNORMAL LOW (ref 30.0–36.0)
MCV: 82.2 fL (ref 80.0–100.0)
Monocytes Absolute: 1.3 10*3/uL — ABNORMAL HIGH (ref 0.1–1.0)
Monocytes Relative: 14 %
Neutro Abs: 5.7 10*3/uL (ref 1.7–7.7)
Neutrophils Relative %: 64 %
Platelets: 255 10*3/uL (ref 150–400)
RBC: 3.94 MIL/uL — ABNORMAL LOW (ref 4.22–5.81)
RDW: 15.8 % — ABNORMAL HIGH (ref 11.5–15.5)
WBC: 9 10*3/uL (ref 4.0–10.5)
nRBC: 0 % (ref 0.0–0.2)

## 2020-07-14 LAB — GLUCOSE, CAPILLARY
Glucose-Capillary: 91 mg/dL (ref 70–99)
Glucose-Capillary: 91 mg/dL (ref 70–99)
Glucose-Capillary: 105 mg/dL — ABNORMAL HIGH (ref 70–99)
Glucose-Capillary: 128 mg/dL — ABNORMAL HIGH (ref 70–99)
Glucose-Capillary: 122 mg/dL — ABNORMAL HIGH (ref 70–99)

## 2020-07-14 LAB — RENAL FUNCTION PANEL
Albumin: 1.9 g/dL — ABNORMAL LOW (ref 3.5–5.0)
Anion gap: 10 (ref 5–15)
BUN: 20 mg/dL (ref 6–20)
CO2: 26 mmol/L (ref 22–32)
Calcium: 8.7 mg/dL — ABNORMAL LOW (ref 8.9–10.3)
Chloride: 99 mmol/L (ref 98–111)
Creatinine, Ser: 4.54 mg/dL — ABNORMAL HIGH (ref 0.61–1.24)
GFR, Estimated: 15 mL/min — ABNORMAL LOW (ref >60)
Glucose, Bld: 91 mg/dL (ref 70–99)
Phosphorus: 3.4 mg/dL (ref 2.5–4.6)
Potassium: 3.7 mmol/L (ref 3.5–5.1)
Sodium: 135 mmol/L (ref 135–145)

## 2020-07-14 LAB — HEMOGLOBIN AND HEMATOCRIT, BLOOD
HCT: 35 % — ABNORMAL LOW (ref 39.0–52.0)
Hemoglobin: 10.6 g/dL — ABNORMAL LOW (ref 13.0–17.0)

## 2020-07-14 MED ORDER — CHLORHEXIDINE GLUCONATE CLOTH 2 % EX PADS
6.0000 | MEDICATED_PAD | Freq: Every day | CUTANEOUS | Status: DC
  Administered 2020-07-15 – 2020-07-16 (×2): 6 via TOPICAL

## 2020-07-14 MED ORDER — ALBUTEROL SULFATE HFA 108 (90 BASE) MCG/ACT IN AERS
1.0000 | INHALATION_SPRAY | RESPIRATORY_TRACT | Status: DC | PRN
  Filled 2020-07-14: qty 6.7

## 2020-07-14 NOTE — Discharge Summary (Signed)
Physician Discharge Summary  Patient ID: Fred [name] MRN: [id number] DOB/AGE: 08-19-1970 50 y.o.  Admit date: 06/06/2020 Discharge date: 07/16/2020  Discharge Diagnoses:  Principal Problem:   Critical illness myopathy Active Problems:   Pressure injury of sacral region, unstageable ([place])   Anemia of chronic disease   ESRD on dialysis ([place])   Diabetic peripheral neuropathy (H[facility])   Labile blood glucose   Essential hypertension   Constipation   Fever   Diarrhea   History of Clostridioides difficile colitis DVT prophylaxis ID/coccygeal osteomyelitis Morbid obesity  Discharged Condition: Stable  Significant Diagnostic Studies: DG Chest 2 View  Result Date: 06/29/2020 CLINICAL DATA:  Fever and chills EXAM: CHEST - 2 VIEW COMPARISON:  Chest radiograph June 16, 2020 FINDINGS: Right IJ CVC with tip overlying the superior cavoatrial junction. The heart size and mediastinal contours are unchanged. Slightly reduced lung volumes. Similar minimal patchy left basilar opacity. No new focal consolidation. The visualized skeletal structures are unchanged. IMPRESSION: Similar minimal patchy left basilar opacity likely atelectasis. Electronically Signed   By: Dahlia Bailiff MD   On: 06/29/2020 21:47   MR PELVIS WO CONTRAST  Result Date: 07/01/2020 CLINICAL DATA:  Sacral decubitus ulcer. Evaluate for possible osteomyelitis. EXAM: MRI PELVIS WITHOUT CONTRAST TECHNIQUE: Multiplanar multisequence MR imaging of the pelvis was performed. No intravenous contrast was administered. COMPARISON:  MRI 04/29/2020 FINDINGS: Urinary Tract: As demonstrated on the prior MRI also there is layering dependent material in the bladder which has slightly decreased T2 signal intensity and slight increased T1 signal intensity. It is likely a small amount of hemorrhage or possibly some type of proteinaceous debris. No bladder mass is identified. Bowel: The rectum, sigmoid colon and visualized small-bowel loops are grossly  normal. Vascular/Lymphatic: No significant vascular abnormalities. Small scattered pelvic and inguinal lymph nodes. Reproductive: The prostate gland and seminal vesicles are unremarkable. Other:  No free pelvic fluid collections or intrapelvic abscess. Musculoskeletal: There is a sizable left-sided sacral decubitus ulcer. This has increased in size since the prior study. Thick rim of granulation tissue is noted along with some fluid in the wound. Mild surrounding changes of cellulitis and myositis involving the left gluteus maximus muscle and also to a lesser extent the left piriformis muscle. No findings to suggest pyomyositis. I do not see any definite MR findings to suggest osteomyelitis or septic arthritis. IMPRESSION: 1. Enlarging left-sided sacral decubitus ulcer. Surrounding cellulitis and myositis involving the left gluteus maximus muscle and left piriformis muscle. 2. No definite MR findings to suggest osteomyelitis or septic arthritis. 3. Layering dependent material in the bladder likely a small amount of hemorrhage or possibly some type of proteinaceous debris. Electronically Signed   By: Marijo Sanes M.D.   On: 07/01/2020 13:17   DG CHEST PORT 1 VIEW  Result Date: 07/08/2020 CLINICAL DATA:  Congestion and cough for 4 days. EXAM: PORTABLE CHEST 1 VIEW COMPARISON:  PA and lateral chest 06/29/2020. FINDINGS: Right IJ approach dialysis catheter is unchanged. Lungs are clear. Heart size is normal. No pneumothorax or pleural fluid. No acute or focal bony abnormality. IMPRESSION: No acute disease. Electronically Signed   By: Inge Rise M.D.   On: 07/08/2020 11:02   DG CHEST PORT 1 VIEW  Result Date: 06/16/2020 CLINICAL DATA:  Dyspnea on exertion. EXAM: PORTABLE CHEST 1 VIEW COMPARISON:  04/29/2020 and prior. FINDINGS: Right IJ CVC tip overlies the superior cavoatrial junction. No pneumothorax or pleural effusion. Mild hypoinflation. Minimal patchy left basilar opacities. Stable cardiomediastinal  silhouette. No acute osseous abnormality.  IMPRESSION: Minimal left basilar opacities, atelectasis versus infiltrate. Electronically Signed   By: Primitivo Gauze M.D.   On: 06/16/2020 09:01    Labs:  Basic Metabolic Panel: Recent Labs  Lab 07/10/20 1239 07/12/20 0611 07/14/20 0534 07/15/20 1422  NA 134* 136 135 134*  K 4.6 4.1 3.7 3.3*  CL 99 101 99 99  CO2 '27 28 26 27  '$ GLUCOSE 103* 83 91 120*  BUN 29* 27* 20 19  CREATININE 5.75* 5.72* 4.54* 4.29*  CALCIUM 9.1 8.8* 8.7* 8.0*  PHOS 4.2 4.2 3.4 2.7    CBC: Recent Labs  Lab 07/10/20 1239 07/10/20 1653 07/12/20 0611 07/12/20 1628 07/14/20 0534 07/14/20 1753 07/15/20 0504 07/15/20 1713  WBC 9.1  --  10.8*  --  9.0  --   --   --   NEUTROABS  --   --  7.7  --  5.7  --   --   --   HGB 10.0*   < > 10.7*   < > 9.7* 10.6* 10.1* 10.5*  HCT 33.2*   < > 34.3*   < > 32.4* 35.0* 33.6* 33.4*  MCV 83.8  --  82.7  --  82.2  --   --   --   PLT 327  --  295  --  255  --   --   --    < > = values in this interval not displayed.    CBG: Recent Labs  Lab 07/14/20 2113 07/15/20 0538 07/15/20 1206 07/15/20 1723 07/15/20 2106  GLUCAP 122* 96 101* 85 94    Brief HPI:   Fred [name] is a 50 y.o. right-handed male with history of hypertension, diabetes mellitus, obesity with BMI 38.37, end-stage renal disease with hemodialysis as well is reported medical noncompliance with hypertensive medications.  Patient with a long complicated history.  Prior to patient's initial hospital admission he lived with his 9 year old father in [place] independent prior to admission 1 level home.  Presented 04/29/2020 from [facility] skilled nursing facility where he had been for an extended time for events from COVID-19 04/20/2020 and issues related to his gallbladder and essentially was bedbound developed sacral decubitus as well as bilateral heel wounds.  On admission patient with increasing shortness of breath as well as low-grade fever.  Sacral  ulcer did have a foul-smelling odor.  Admission chemistries lactic acid 0.9 blood cultures no growth to date BUN 11 creatinine 4.41 WBC 5800 hemoglobin 7.8 procalcitonin 1.24.  Chest x-ray showed diffuse bilateral airspace disease correlated with COVID-19 status.  MRI of the left foot area of ulceration with nailbed irregularity at the first digit with findings suggestive of osteomyelitis involving the first digit distal tuft.  No loculated fluid collection or sinus tract and right foot MRI abnormal osseous edema head of the fifth metatarsal suspicious for osteomyelitis.  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  Surgery was consulted in regards to sacral and heel wounds and was receiving hydrotherapy.  Placed on vancomycin and cefepime through 06/10/2020.  Dr. Sharol Given was consulted in regards to unstageable pressure wounds of left lateral foot lateral malleolus right lateral foot and heel dry gangrene of left fourth toe as well as left first metatarsal and right fifth metatarsal advised Prevalon boots conservative care follow-up outpatient.  Hemodialysis ongoing as per renal services monitoring of blood pressure.  Subcutaneous heparin for DVT prophylaxis.  Due to patient's profound deconditioning critical illness  myopathy he was admitted for a comprehensive rehab program.   Hospital Course: Eyoel Daws was admitted to rehab 06/06/2020 for inpatient therapies to consist of PT, ST and OT at least three hours five days a week. Past admission physiatrist, therapy team and rehab RN have worked together to provide customized collaborative inpatient rehab.  Pertaining to patient's critical illness myopathy secondary to COVID-19 remained stable he was participating with therapies with ongoing encouragement.  Subcutaneous heparin for DVT prophylaxis.  Pain managed with use of Tylenol.  Hemodialysis ongoing as per renal  services.  Sacral decubitus ulcer as well as ischemic ulcers of the left fourth toe and fifth metatarsal head and right heel and fifth metatarsal head.  Wound care follow-up he was to see Dr. Sharol Given as an outpatient.  Continue with PRAFO's.  ID/coccygeal osteomyelitis completed vancomycin and cefepime 06/10/2020 and he continued to be followed as needed by general surgery.  Patient did need ongoing encouragement to pressure release and stay off of his backside.  Anemia of chronic disease latest hemoglobin of 10 Aranesp as advised.  Diabetes mellitus peripheral neuropathy hemoglobin 5.6 Lantus insulin as directed.  Morbid obesity BMI 38.37 dietary follow-up.  Patient with bouts of C. difficile diarrhea noted maintained on vancomycin after discussed with infectious disease completing course.  Noted mood stabilization trial of Celexa neuropsychology follow-up emotional support provided.   Blood pressures were monitored on TID basis and soft and monitored  Diabetes has been monitored with ac/hs CBG checks and SSI was use prn for tighter BS control.    Rehab course: During patient's stay in rehab weekly team conferences were held to monitor patient's progress, set goals and discuss barriers to discharge. At admission, patient required total assist lateral scoot transfers supervision sit to side-lying.  Mod assist upper body bathing max assist lower body bathing minimal assist upper dressing max is lower body dressing  Physical exam.  Blood pressure 182/66 pulse 96-94 respirations 16 oxygen saturation 92% room air Constitutional.  No acute distress HEENT Head.  Normocephalic and atraumatic Eyes.  Pupils round and reactive to light no discharge without nystagmus Neck.  Supple nontender no JVD without thyromegaly Cardiac regular rate rhythm any extra sounds or murmur heard Abdomen.  Soft nontender positive bowel sounds without rebound Respiratory effort normal no respiratory distress without wheeze Skin.   Sacral decubitus ulcer bilateral heel dressings were in place change routinely Neurologic alert no acute distress strength grossly graded 4/5 throughout.  He/She  has had improvement in activity tolerance, balance, postural control as well as ability to compensate for deficits. He/She has had improvement in functional use RUE/LUE  and RLE/LLE as well as improvement in awareness.  Working with energy conservation techniques.  Patient directed in supine to sit edge of bed extra time required in bed rails at SBA.  Patient needing ongoing encouragement times to participate.  Directed in dynamic sitting with reaching in various directions in and outside of BOS for improved balance.  Moderate assist x2 sit to stand and extra time required.  Patient again required prolonged rest breaks between repetitions.  Sit to stand to rolling walker moderate assist to achieve full standing.  Patient directed in standing marching x10 marches with each stand for improved tolerance to standing activities.  Patient essentially dependent for lower body ADLs.  Supervision sit to supine.  Lower body bathing with minimal assist for buttocks at bed level and donned pants with minimal assist for threading sitting edge of bed.  Due to patient's limited  advances and need for physical assistance he was discharged to skilled nursing facility       Disposition: Discharge to skilled nursing facility    Diet: Carb modified diet  Special Instructions: Continue hemodialysis as directed.  1200 mL fluid restriction  Santyl ointment applied B/l feet over sloth areas  Maintain bilateral PRAFO's  Wash both feet with soap and water rinse and pat dry.  Apply Betadine to all unstageable areas of the feet by painting on thoroughly and allowing to air dry.  Apply daily.  Insert saline moistened gauze to sacral wound.  Be sure to place into the undermined areas all around the edge of the wound.  Cover with ABD pads taped in  place   Medications at discharge 1.  Tylenol as needed 2.  Albuterol inhaler 1 to 2 puffs every 4 hours as needed wheezing 3.  Norvasc 5 mg p.o. nightly 4.  Celexa 20 mg p.o. daily 5.  Lantus insulin 11 units nightly 6.  Fosrenol 500 mg p.o. 3 times daily with meals 7.  Rena-Vite 1 tablet p.o. nightly 8.  Zofran 4 mg every 6 hours as needed nausea 9.  Protonix 40 mg p.o. daily     Discharge Instructions    Ambulatory referral to Physical Medicine Rehab   Complete by: As directed    Follow-up 1 month patient for skilled nursing facility.  Critical illness myopathy.       Contact information for follow-up providers    Lovorn, Jinny Blossom, MD Follow up.   Specialty: Physical Medicine and Rehabilitation Why: Only as needed Contact information: [id number] [place] [address] [phone]        Rosita Fire, MD Follow up.   Specialties: Nephrology, Internal Medicine Why: Call for appointment Contact information: [place] [address] [phone]        Newt Minion, MD Follow up.   Specialty: Orthopedic Surgery Why: Call for appointment Contact information: [place] [address] [phone]            Contact information for after-discharge care    Destination    [facility] .   Service: Skilled Nursing Contact information: [place] [phone]                  Signed: Cathlyn Parsons 07/16/2020, 5:20 AM

## 2020-07-14 NOTE — Progress Notes (Signed)
Patient ID: Fred [name], male   DOB: 1970-12-03, 50 y.o.   MRN: [id number] Spoke with [name] who will let me know if bed. Aware pt has C-diff will be going to private room. Will let know after her admissions meeting today. Dad and niece coming today to see pt.

## 2020-07-14 NOTE — Progress Notes (Signed)
Occupational Therapy Session Note  [name] MRN: [id number] Date of Birth: August 15, 1970  Today's Date: 07/14/2020 OT Individual Time: 1400-1513 OT Individual Time Calculation (min): 73 min    Short Term Goals:  Week 4:  OT Short Term Goal 1 (Week 4): STGs=LTGs due to ELOS OT Short Term Goal 1 - Progress (Week 4): Progressing toward goal Week 5:  OT Short Term Goal 1 (Week 5): STGs=LTGs due to ELOS  Skilled Therapeutic Interventions/Progress Updates:    Pt greeted at time of session supine in bed resting with father in room for visit, pt noted to be soiled and therapist completed dependent brief change at bed level with pt rolling L/R for full hygiene. Barrier cream applied as well. Pt declined further ADL. Supine > sit Supervision and performed grooming/face washing/ oral hygiene sitting unsupported EOB. Performed BUE strengthening with 3# dowel for bicep curl, chest press, pulls with resistance, FWD and backward circles with cues for form 2x15-20 reps. Core strengthening activities with 3# dowel as well with anterior weight shifts and hip flexion. Discussion all throughout session with family regarding barriers to DC home, potential changes to home environment at dad's house, recommendations for BSC/home set up/etc when pt is ready later on to go home. Pt shared upcoming SNF DC potentially this Friday. Trialed sit > stands x2 trials both with Max A and able to stand approx 15 seconds. BP prior to standing 141/94 and 118/71 after first stand. After recovery period, pt stood again and quicly returned to sitting with posterior lean limiting, pt noted to "zone out" not respond to verbal stimuli and quickly returned to supine. Pt "came around" quickly after legs elevated and did not remember how he got into supine. BP WNL in supine. Relayed episode to nursing. Pt in bed feeling back to "normal" and in bed with call bell in reach all needs met. Removed ace wraps as well prior to  leaving as this is last therapy session.   Therapy Documentation Precautions:  Precautions Precautions: Fall Precaution Comments: wounds on bilat feet and L sacrum, inconsistent orthostatic BPs Required Braces or Orthoses: Other Brace Other Brace: abdominal binder PRN with standing/strenuous activity Restrictions Weight Bearing Restrictions: No     Therapy/Group: Individual Therapy  [name] 07/14/2020, 3:16 PM

## 2020-07-14 NOTE — Progress Notes (Signed)
Physical Therapy Session Note  [name] MRN: [id number] Date of Birth: June 28, 1970  Today's Date: 07/14/2020 PT Individual Time: 1001-1045 PT Individual Time Calculation (min): 44 min  Missed time 15' Short Term Goals: Week 3:  PT Short Term Goal 1 (Week 3): Pt will transfer bed<>chair with LRAD and min A PT Short Term Goal 1 - Progress (Week 3): Met (with slide board, mod A with RW) PT Short Term Goal 2 (Week 3): Pt will transfer sit<>stand with LRAD and mod A from WC PT Short Term Goal 2 - Progress (Week 3): Met PT Short Term Goal 3 (Week 3): pt will perform all bed mobility with CGA PT Short Term Goal 3 - Progress (Week 3): Met  Skilled Therapeutic Interventions/Progress Updates: Pt presents supine in bed and agreeable to participate w/ therapy.  Pt c/o stomach ache and increased BMs.  Pt required only supervision and increased time for sup to sit transfer.  Pt sat EOB w/ c/o nausea, some phlegm and dry heaving only.  [name] taken and 147/104 and HR 128.  Pt states feeling OK and transferred sit to stand w/ max A.  Unable to maintain standing for BP, returned to sitting w/ c/o dizziness and required sit to sup transfers.  RN unable to come to room.  BP taken at 173/107, HR 96, retaken and 137/96.  Pt remained in bed w/ bed alarm on and all needs in reach.  Met w/ RN re: findings and will continue to monitor.     Therapy Documentation Precautions:  Precautions Precautions: Fall Precaution Comments: wounds on bilat feet and L sacrum, inconsistent orthostatic BPs Required Braces or Orthoses: Other Brace Other Brace: abdominal binder PRN with standing/strenuous activity Restrictions Weight Bearing Restrictions: No General: PT Amount of Missed Time (min): 15 Minutes PT Missed Treatment Reason: Patient ill (Comment) Vital Signs:  Pain: no c/o pain, stomach ache, BM's Pain Assessment Pain Scale: 0-10 Pain Score: 0-No pain Mobility:      Therapy/Group:  Individual Therapy  Fred [name] 07/14/2020, 10:51 AM

## 2020-07-14 NOTE — Progress Notes (Signed)
[facility] Progress Note   Subjective:   Patient seen and examined at bedside in room.  Continues to have diarrhea today.  No new complaints.    Objective Vitals:   07/13/20 1920 07/13/20 2027 07/14/20 0441 07/14/20 1124  BP: (!) 143/94 (!) 151/93 (!) 153/89 (!) 159/94  Pulse: 91 96 92 95  Resp: '18 18 19 18  '$ Temp: 98.9 F (37.2 C) 98.5 F (36.9 C) 98.7 F (37.1 C) 99.5 F (37.5 C)  TempSrc: Oral   Oral  SpO2: 100% 99% 97% 100%  Weight:      Height:       Physical Exam General:well developed, ill appearing male, laying in bed in NAD Heart:RRR, no mrg Lungs:CTAB Abdomen:soft, NTND Extremities:no LE edema Dialysis Access: [facility] c/d/i   [facility] Weights   07/08/20 1630 07/10/20 1245 07/10/20 1530  Weight: 115.5 kg 116 kg 115 kg    Intake/Output Summary (Last 24 hours) at 07/14/2020 1209 Last data filed at 07/14/2020 0433 Gross per 24 hour  Intake 240 ml  Output 1600 ml  Net -1360 ml    Additional Objective Labs: Basic Metabolic Panel: Recent Labs  Lab 07/10/20 1239 07/12/20 0611 07/14/20 0534  NA 134* 136 135  K 4.6 4.1 3.7  CL 99 101 99  CO2 '27 28 26  '$ GLUCOSE 103* 83 91  BUN 29* 27* 20  CREATININE 5.75* 5.72* 4.54*  CALCIUM 9.1 8.8* 8.7*  PHOS 4.2 4.2 3.4   Liver Function Tests: Recent Labs  Lab 07/10/20 1239 07/12/20 0611 07/14/20 0534  ALBUMIN 1.9* 1.9* 1.9*   CBC: Recent Labs  Lab 07/08/20 1322 07/09/20 0509 07/09/20 1656 07/10/20 1239 07/10/20 1653 07/12/20 0611 07/12/20 1628 07/13/20 0518 07/13/20 1617 07/14/20 0534  WBC 8.3 8.7  --  9.1  --  10.8*  --   --   --  9.0  NEUTROABS  --  5.3  --   --   --  7.7  --   --   --  5.7  HGB 9.9* 10.2*   < > 10.0*   < > 10.7*   < > 10.2* 9.7* 9.7*  HCT 32.1* 33.8*   < > 33.2*   < > 34.3*   < > 34.1* 32.3* 32.4*  MCV 82.9 83.0  --  83.8  --  82.7  --   --   --  82.2  PLT 295 292  --  327  --  295  --   --   --  255   < > = values in this interval not displayed.   CBG: Recent  Labs  Lab 07/12/20 2108 07/13/20 0626 07/13/20 1124 07/13/20 2021 07/14/20 0604  GLUCAP 138* 103* 145* 91 91    Medications:  . amLODipine  5 mg Oral QHS  . Chlorhexidine Gluconate Cloth  6 each Topical BID  . Chlorhexidine Gluconate Cloth  6 each Topical Q0600  . citalopram  20 mg Oral Daily  . collagenase   Topical Daily  . darbepoetin (ARANESP) injection - DIALYSIS  100 mcg Intravenous Q Tue-HD  . feeding supplement (NEPRO CARB STEADY)  237 mL Oral TID BM  . fluticasone  2 spray Each Nare Daily  . heparin injection (subcutaneous)  7,500 Units Subcutaneous Q8H  . insulin aspart  0-9 Units Subcutaneous TID WC  . insulin glargine  11 Units Subcutaneous QHS  . lanthanum  500 mg Oral TID WC  . multivitamin  1 tablet Oral QHS  . pantoprazole  40 mg Oral Q0600  . pneumococcal 23 valent vaccine  0.5 mL Intramuscular Tomorrow-1000  . vancomycin  500 mg Oral QID    Dialysis Orders: NWTTS 4h 400/800 143.5kg 3K/2.5Ca TDCHeparin3000units IV TIW - Mircera175mgIVq2wks - last 12/8 - Venofer '100mg'$ IVqHD x10 - completed 6/10 doses  Assessment/Plan: 1. History of prolonged hospitalization with COVID-19 and consequent debilitation/sacrococcygeal decubitus/osteomyelitis:Status post completion of Vanc/Cefepime (06/10/20).  In CIR. To hopefully d/c home some point this week.  2. Clostridium difficile associated diarrhea: on oral vancomycin with continued diarrhea and occasional nausea. PRN imodium and zofran/compazine noted.  3. ESRD: HD TTS. Continue on schedule. Next HD tomorrow. 4. HTN/volume:BP controlled. On amlodipine.  Hydralazine ordered prn but has not needed. Some issues throughout stay with orthostatic hypotension.  Seems improved.  No gross volume excess on exam. Minimal UF on HD.  5. Anemia:Hgb stable, last 9.7. IV iron ordered. Last ESA 201m aranesp on 2/15, will lower dose.    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 Fosrenol has been held for last several days, restart  if phos starts to climb.    7. Nutrition: Renal diet with fluid restriction. Cont prot supp for low albumin.  8. DMT2 - per admit   LiJen MowPA-C [place]/23/2022,12:09 PM  LOS: 38 days

## 2020-07-14 NOTE — Progress Notes (Incomplete)
Occupational Therapy Session Note  Patient Details  Name: Malakai Vangelder MRN: [id number] Date of Birth: 11-02-1970  {CHL IP REHAB OT TIME CALCULATIONS:[id number]}   Short Term Goals: {OT [id number]  Skilled Therapeutic Interventions/Progress Updates:      Therapy Documentation Precautions:  Precautions Precautions: Fall Precaution Comments: wounds on bilat feet and L sacrum, inconsistent orthostatic BPs Required Braces or Orthoses: Other Brace Other Brace: abdominal binder PRN with standing/strenuous activity Restrictions Weight Bearing Restrictions: No     Therapy/Group: Individual Therapy  [name] 07/14/2020, 9:06 AM

## 2020-07-14 NOTE — Discharge Instructions (Signed)
Inpatient Rehab Discharge Instructions  [facility] Discharge date and time: No discharge date for patient encounter.   Activities/Precautions/ Functional Status: Activity: As tolerated Diet: Carb modified 1200 mL fluid restriction Wound Care: Routine skin checks Functional status:  ___ No restrictions     ___ Walk up steps independently ___ 24/7 supervision/assistance   ___ Walk up steps with assistance ___ Intermittent supervision/assistance  ___ Bathe/dress independently ___ Walk with walker     _x__ Bathe/dress with assistance ___ Walk Independently    ___ Shower independently ___ Walk with assistance    ___ Shower with assistance ___ No alcohol     ___ Return to work/school ________  Special Instructions: No driving smoking or alcohol  Continue hemodialysis as directed  Wash both feet with soap and water rinse and pat dry.  Apply Betadine to all unstageable areas of the feet by painting on thoroughly and allowed to air dry.  Apply daily  Insert saline moistened gauze to sacral wound.  Be sure to place into the undermined areas all around the edge of the wound.  Cover with ABD pads taped in place.   My questions have been answered and I understand these instructions. I will adhere to these goals and the provided educational materials after my discharge from the hospital.  Patient/Caregiver Signature _______________________________ Date __________  Clinician Signature _______________________________________ Date __________  Please bring this form and your medication list with you to all your follow-up doctor's appointments.

## 2020-07-14 NOTE — Progress Notes (Signed)
Patient ID: Fred [name], male   DOB: Aug 27, 1970, 50 y.o.   MRN: [id number] Spoke with [name]-admissions from [facility] who will have a bed on Friday. Have informed staff and pt and sister. Will work toward transfer Friday. Dan-PA to order COVID test tomorrow and work toward discharge Friday. [name] aware and will inform of transfer so first OP-HD on Sat at [facility]

## 2020-07-14 NOTE — Progress Notes (Signed)
[place] PHYSICAL MEDICINE & REHABILITATION PROGRESS NOTE   Subjective/Complaints:  Pt reports he's sleepy this AM- wants to hear more about COVID vaccine-  Has a lingering cough.   Still having loose stools.  ROS:   Pt denies SOB, abd pain, CP, N/V/C, and vision changes   Objective:   No results found. Recent Labs    07/12/20 0611 07/12/20 1628 07/13/20 1617 07/14/20 0534  WBC 10.8*  --   --  9.0  HGB 10.7*   < > 9.7* 9.7*  HCT 34.3*   < > 32.3* 32.4*  PLT 295  --   --  255   < > = values in this interval not displayed.   Recent Labs    07/12/20 0611 07/14/20 0534  NA 136 135  K 4.1 3.7  CL 101 99  CO2 28 26  GLUCOSE 83 91  BUN 27* 20  CREATININE 5.72* 4.54*  CALCIUM 8.8* 8.7*    Intake/Output Summary (Last 24 hours) at 07/14/2020 0851 Last data filed at 07/14/2020 0433 Gross per 24 hour  Intake 240 ml  Output 1600 ml  Net -1360 ml     Pressure Injury 05/01/20 Buttocks Left Stage 4 - Full thickness tissue loss with exposed bone, tendon or muscle. packed with santyl gauze, dressed by WOCN, (Active)  05/01/20 0230  Location: Buttocks  Location Orientation: Left  Staging: Stage 4 - Full thickness tissue loss with exposed bone, tendon or muscle.  Wound Description (Comments): packed with santyl gauze, dressed by WOCN,  Present on Admission: Yes     Pressure Injury 05/01/20 Ankle Left;Lateral Unstageable - Full thickness tissue loss in which the base of the injury is covered by slough (yellow, tan, gray, green or brown) and/or eschar (tan, brown or black) in the wound bed. smal circular wound on the (Active)  05/01/20 0230  Location: Ankle  Location Orientation: Left;Lateral  Staging: Unstageable - Full thickness tissue loss in which the base of the injury is covered by slough (yellow, tan, gray, green or brown) and/or eschar (tan, brown or black) in the wound bed.  Wound Description (Comments): smal circular wound on the bone  Present on Admission: Yes      Pressure Injury 05/01/20 Foot Left;Lateral Unstageable - Full thickness tissue loss in which the base of the injury is covered by slough (yellow, tan, gray, green or brown) and/or eschar (tan, brown or black) in the wound bed. 4th and 5th toes appear nec (Active)  05/01/20 0230  Location: Foot (4th and 5th toes and lateral/anterior side of the sole)  Location Orientation: Left;Lateral  Staging: Unstageable - Full thickness tissue loss in which the base of the injury is covered by slough (yellow, tan, gray, green or brown) and/or eschar (tan, brown or black) in the wound bed.  Wound Description (Comments): 4th and 5th toes appear necrotic circular wound on sole  Present on Admission: Yes     Pressure Injury 05/01/20 Foot Right;Medial;Posterior Unstageable - Full thickness tissue loss in which the base of the injury is covered by slough (yellow, tan, gray, green or brown) and/or eschar (tan, brown or black) in the wound bed. large circular wo (Active)  05/01/20 0230  Location: Foot  Location Orientation: Right;Medial;Posterior  Staging: Unstageable - Full thickness tissue loss in which the base of the injury is covered by slough (yellow, tan, gray, green or brown) and/or eschar (tan, brown or black) in the wound bed.  Wound Description (Comments): large circular wound on sole  and heel  Present on Admission: Yes     Pressure Injury 05/08/20 Heel Right Unstageable - Full thickness tissue loss in which the base of the injury is covered by slough (yellow, tan, gray, green or brown) and/or eschar (tan, brown or black) in the wound bed. (Active)  05/08/20 1718  Location: Heel  Location Orientation: Right  Staging: Unstageable - Full thickness tissue loss in which the base of the injury is covered by slough (yellow, tan, gray, green or brown) and/or eschar (tan, brown or black) in the wound bed.  Wound Description (Comments):   Present on Admission: Yes    Physical Exam: Vital Signs Blood  pressure (!) 153/89, pulse 92, temperature 98.7 F (37.1 C), resp. rate 19, height _0  (1.981 m), weight 115 kg, SpO2 97 %. Gen: awake, but sleepy, dull affect; laying supine in bed, NAD HEENT: oral mucosa pink and moist, NCAT Cardio: RRR_ no JVD Chest: CTA B/L- no W/R/R- good air movement but has lingering cough Abd: soft, somewhat TTP- sore; ND; hyperactive BS Extremities: edema in extremities.  No tenderness in extremities. Neuro: sleepy/dull affect Psychiatric: dull/flat affect Motor: Bilateral upper extremities: 5/5 proximal distal Bilateral lower extremities: 4-/5 proximal distal   Skin: Multiple foot wounds. L foot- has ruptured blood blister 5th MTP base; also 4th toe is dry gangrene- all black eschar. Large sacral wound- no change Feet wounds look the same- no significant improvement in healing since seen last time  Assessment/Plan: 1. Functional deficits which require 3+ hours per day of interdisciplinary therapy in a comprehensive inpatient rehab setting.  Physiatrist is providing close team supervision and 24 hour management of active medical problems listed below.  Physiatrist and rehab team continue to assess barriers to discharge/monitor patient progress toward functional and medical goals  Care Tool:  Bathing    Body parts bathed by patient: Right arm,Left arm,Chest,Abdomen,Face,Right upper leg,Left upper leg,Right lower leg,Left lower leg   Body parts bathed by helper: Front perineal area,Buttocks     Bathing assist Assist Level: Minimal Assistance - Patient > 75%     Upper Body Dressing/Undressing Upper body dressing   What is the patient wearing?: Pull over shirt    Upper body assist Assist Level: Set up assist    Lower Body Dressing/Undressing Lower body dressing      What is the patient wearing?: Pants,Underwear/pull up     Lower body assist Assist for lower body dressing: Minimal Assistance - Patient > 75%     Toileting Toileting     Toileting assist Assist for toileting: Minimal Assistance - Patient > 75% Assistive Device Comment:  (urinal)   Transfers Chair/bed transfer  Transfers assist  Chair/bed transfer activity did not occur: Safety/medical concerns (Per nursing report)  Chair/bed transfer assist level: Minimal Assistance - Patient > 75% Chair/bed transfer assistive device: Sliding board,Armrests   Locomotion Ambulation   Ambulation assist   Ambulation activity did not occur: Safety/medical concerns (fatigue, lightheadedness, generalized weakness)  Assist level: Minimal Assistance - Patient > 75% Assistive device: Parallel bars Max distance: 24 ft   Walk 10 feet activity   Assist  Walk 10 feet activity did not occur: Safety/medical concerns (fatigue, lightheadedness, generalized weakness)  Assist level: Minimal Assistance - Patient > 75% Assistive device: Parallel bars   Walk 50 feet activity   Assist Walk 50 feet with 2 turns activity did not occur: Safety/medical concerns (fatigue, lightheadedness, generalized weakness)         Walk 150 feet activity  Assist Walk 150 feet activity did not occur: Safety/medical concerns (fatigue, lightheadedness, generalized weakness)         Walk 10 feet on uneven surface  activity   Assist Walk 10 feet on uneven surfaces activity did not occur: Safety/medical concerns (fatigue, lightheadedness, generalized weakness)         Wheelchair     Assist Will patient use wheelchair at discharge?: Yes Type of Wheelchair: Manual    Wheelchair assist level: Supervision/Verbal cueing Max wheelchair distance: 100    Wheelchair 50 feet with 2 turns activity    Assist        Assist Level: Supervision/Verbal cueing   Wheelchair 150 feet activity     Assist      Assist Level: Minimal Assistance - Patient > 75%   Blood pressure (!) 153/89, pulse 92, temperature 98.7 F (37.1 C), resp. rate 19, height _0  (1.981 m),  weight 115 kg, SpO2 97 %.  Medical Problem List and Plan: 1.  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 Due to participation.  2/22- discussed with pt about risks/benefits of COVID vaccine- strongly recommend esp because could allow him to go  A nursing home closer to home.  2.  Antithrombotics: -DVT/anticoagulation: Continue Subcutaneous heparin.               -antiplatelet therapy: N/A 3. Pain Management: Continue Tylenol as needed, last received 2/20 AM  2/11- denied pain- con't regimen  2/16- having abdominal pain but controlled with tylenol 4. Mood: Provide emotional support             -antipsychotic agents: N/A 5. Neuropsych: This patient is? capable of making decisions on his own behalf. 6. Skin/Wound Care: Routine skin checks 7. Fluids/Electrolytes/Nutrition: Routine in and outs with follow-up chemistries 8.  ESRD.  Hemodialysis per renal services 9.  Sacral decubitus ulcer as well as ischemic ulcers on the left foot fourth toe and fifth metatarsal head and right heel and fifth metatarsal head.  Wound care nurse follow-up.  Patient to see Dr. Sharol Given outpatient.  Continue bilateral PRAFO's. 10.  ID/ coccygeal osteomyelitis.    Completed vancomycin and cefepime on 06/10/2020  1/18- will schedule turning q2 hours to get off wounds- also will make sure on specialty bed  2/7- CRP 6.5- ESR pending - con't regimen  2/8- ESR  DOWN TO 67 FROM 73- SO HEADING IN RIGHT DIRECTION  2/9- Spiked temp last night of 100.9- restarted Vanc/Cefipime- pt refused Vanc "since causes diarrhea". WBC now normal again this AM- will con't for now. CXR (-); is anuric-  2/10- having fevers/sweats/chills- Tm 99.4- needs to also take Vanc- told this to pt- also, called ID and also ordered another CT of pelvis due to Sx's-    2/13 ID saw pt Friday. Thinks that low grade temps are coming from HD cath. Was not  impressed by MRI of pelvis. Recommended stopping abx (d/t loose stool)   -continue to monitor   -continue [facility] per nephrology   -afebrile last 36 hours  2/18- won't stay off backside- is not healing - even with encouragement, not turning off backside   22/23- laying supine 10.  Anemia of chronic disease.  Continue Aranesp.   Hemoglobin 10.0 on 2/3, labs with HD 11.  Diabetes mellitus with peripheral neuropathy .  Hemoglobin A1c 5.6.  Lantus insulin 11 units nightly  CBG (last 3)  Recent Labs  07/13/20 0626 07/13/20 1124 07/14/20 0604  GLUCAP 103* 145* 91    2/23- BGs great- con't regimen 12.  Morbid obesity.  BMI 38.37.  Dietary follow-up 13. Dizziness/nausea with HTN and hypotension  Vitals:   07/13/20 2027 07/14/20 0441  BP: (!) 151/93 (!) 153/89  Pulse: 96 92  Resp: 18 19  Temp: 98.5 F (36.9 C) 98.7 F (37.1 C)  SpO2: 99% 97%    Hydralazine prn   Norvasc to 7.5 mg daily   2/9- BP 156/85 this AM- but has been controlled last few days  2/12-13-bps still with some fluctuation but not severe. Continue current meds, volume mgt per nephrology  2/15- BP's 150s-160s/90s- on Norvasc 7.5- can increase, but usually has orthostatic hypotension when we do. Will d/w Renal  2/16- "passed out" yesterday= will call renal   2/17- renal note shows they will not pull off fluid today in HD due to "passing out event" and diarrhea fluid losses  2/23- BP in 150s/90s 14. Diarrhea with new constipation  Bowel meds increased on 2/5  2/7- will decrease bowel meds again since 8+ BMs in last 24 hours- con't regimen  2/12 stool still mushy. Stopped miralax  2/14- will allow them to use the Imodium prn that's ordered- since still having so many stools- he does this from IV ABX  2/18- has C diff- off Imodium 15. Leukocytosis: Resolved  ESR/CRP and CBC with diff ordered for Monday.  2/7- CRP 6.5- stable- con't regimen   2/8- EST 67- down to 73- con't regimen  2/11- Labs looking slightly better,  but imaging does not- waiting to hear from ID  2/14- per ID, pelvis osteomyelitis not the issue- off ABX per their request 16. N/Vomiting-  2/10- likely due to underlying illness- will do ID w/u as above.  17. 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   2/16- abdominal pain slightly better  2/17- BMs less frequent per pt and less abd pain- con't regimen- will recheck labs   2/18- WBC down to 8.7- no Left shift- 1 watery BM today- is doing better- except had Tm 100.5 overnight- if spikes again, might need to call ID back.  2/21- No elevated WBC now- 2-3 loose/liquid BMs in last 24 hours.   2/22- 5+ loose/liquid BMs in last 24 hours- will check with ID to see if should increase Vanc dose, any other ideas- was dx'd 1 week ago.    2/23- increased Vanc to 250 mg PO QID per d/w Dr Juleen China- Pharmacy wanted 500 mg QID and changed him to 500 mg QID 16. Dispo  2/8- pt unhappy that has to back to SNF- however has been participating intermittently- finally doing more/more interactive, but we can only keep max of 30 days. Sent FL2  2/9- pt refused PT yesterday -only bedlevel Exercises- pt needs max encouragement to progress with therapy   2/10- might have to hold d/c- we will see.  2/11- might not be able to d/c Monday per SNF search/SW   2/15- told SW that cannot d/c right now due to C Diff.  2/21- will call sister tomorrow- cannot since going to clinic today.  2/23- discussed bowel program to start after C Diff and COVID Vaccine   17. Depression  2/22- d/w sister- will try Celexa 20 mg daily  To help since doesn't want to do therapy/participate.  18. Lingering cough  2/23- add Flutter valve and Albuterol prn inhaler.   2/22- Called sister about medical issues. Discussed in depth - for an additional 20 minutes. Total 35 minutes on care- >50% coordination of care.      LOS: 38 days A  FACE TO FACE EVALUATION WAS PERFORMED  Antario Yasuda 07/14/2020, 8:51 AM

## 2020-07-15 LAB — HEMOGLOBIN AND HEMATOCRIT, BLOOD
HCT: 33.6 % — ABNORMAL LOW (ref 39.0–52.0)
HCT: 33.4 % — ABNORMAL LOW (ref 39.0–52.0)
Hemoglobin: 10.1 g/dL — ABNORMAL LOW (ref 13.0–17.0)
Hemoglobin: 10.5 g/dL — ABNORMAL LOW (ref 13.0–17.0)

## 2020-07-15 LAB — RENAL FUNCTION PANEL
Albumin: 2 g/dL — ABNORMAL LOW (ref 3.5–5.0)
Anion gap: 8 (ref 5–15)
BUN: 19 mg/dL (ref 6–20)
CO2: 27 mmol/L (ref 22–32)
Calcium: 8 mg/dL — ABNORMAL LOW (ref 8.9–10.3)
Chloride: 99 mmol/L (ref 98–111)
Creatinine, Ser: 4.29 mg/dL — ABNORMAL HIGH (ref 0.61–1.24)
GFR, Estimated: 16 mL/min — ABNORMAL LOW (ref >60)
Glucose, Bld: 120 mg/dL — ABNORMAL HIGH (ref 70–99)
Phosphorus: 2.7 mg/dL (ref 2.5–4.6)
Potassium: 3.3 mmol/L — ABNORMAL LOW (ref 3.5–5.1)
Sodium: 134 mmol/L — ABNORMAL LOW (ref 135–145)

## 2020-07-15 LAB — GLUCOSE, CAPILLARY
Glucose-Capillary: 96 mg/dL (ref 70–99)
Glucose-Capillary: 101 mg/dL — ABNORMAL HIGH (ref 70–99)
Glucose-Capillary: 85 mg/dL (ref 70–99)
Glucose-Capillary: 94 mg/dL (ref 70–99)

## 2020-07-15 LAB — SARS CORONAVIRUS 2 (TAT 6-24 HRS): SARS Coronavirus 2: NEGATIVE

## 2020-07-15 MED ORDER — CITALOPRAM HYDROBROMIDE 20 MG PO TABS: 20.0000 mg | ORAL_TABLET | Freq: Every day | ORAL | Status: AC

## 2020-07-15 MED ORDER — SODIUM CHLORIDE 0.9 % IV SOLN: 100.0000 mL | INTRAVENOUS | Status: DC | PRN

## 2020-07-15 MED ORDER — ALTEPLASE 2 MG IJ SOLR: 2.0000 mg | Freq: Once | INTRAMUSCULAR | Status: DC | PRN

## 2020-07-15 MED ORDER — LIDOCAINE HCL (PF) 1 % IJ SOLN: 5.0000 mL | INTRAMUSCULAR | Status: DC | PRN

## 2020-07-15 MED ORDER — COLLAGENASE 250 UNIT/GM EX OINT: TOPICAL_OINTMENT | Freq: Every day | CUTANEOUS | 0 refills | Status: DC

## 2020-07-15 MED ORDER — ONDANSETRON HCL 4 MG PO TABS: 4.0000 mg | ORAL_TABLET | Freq: Four times a day (QID) | ORAL | 0 refills | Status: AC | PRN

## 2020-07-15 MED ORDER — LIDOCAINE-PRILOCAINE 2.5-2.5 % EX CREA: 1.0000 "application " | TOPICAL_CREAM | CUTANEOUS | Status: DC | PRN

## 2020-07-15 MED ORDER — LANTHANUM CARBONATE 500 MG PO CHEW: 500.0000 mg | CHEWABLE_TABLET | Freq: Three times a day (TID) | ORAL | Status: DC

## 2020-07-15 MED ORDER — PENTAFLUOROPROP-TETRAFLUOROETH EX AERO: 1.0000 "application " | INHALATION_SPRAY | CUTANEOUS | Status: DC | PRN

## 2020-07-15 MED ORDER — HEPARIN SODIUM (PORCINE) 1000 UNIT/ML IJ SOLN
INTRAMUSCULAR | Status: AC
  Administered 2020-07-15: 3800 [IU] via INTRAVENOUS_CENTRAL
  Filled 2020-07-15: qty 4

## 2020-07-15 MED ORDER — HEPARIN SODIUM (PORCINE) 1000 UNIT/ML DIALYSIS: 1000.0000 [IU] | INTRAMUSCULAR | Status: DC | PRN

## 2020-07-15 MED ORDER — PANTOPRAZOLE SODIUM 40 MG PO TBEC: 40.0000 mg | DELAYED_RELEASE_TABLET | Freq: Every day | ORAL | Status: AC

## 2020-07-15 MED ORDER — ALBUTEROL SULFATE HFA 108 (90 BASE) MCG/ACT IN AERS: 1.0000 | INHALATION_SPRAY | RESPIRATORY_TRACT | Status: AC | PRN

## 2020-07-15 MED ORDER — AMLODIPINE BESYLATE 5 MG PO TABS: 5.0000 mg | ORAL_TABLET | Freq: Every day | ORAL | Status: AC

## 2020-07-15 MED ORDER — INSULIN GLARGINE 100 UNIT/ML ~~LOC~~ SOLN: 11.0000 [IU] | Freq: Every day | SUBCUTANEOUS | 11 refills | Status: DC

## 2020-07-15 NOTE — Progress Notes (Signed)
[place] PHYSICAL MEDICINE & REHABILITATION PROGRESS NOTE   Subjective/Complaints:  Pt reports sleepy- "not a morning person".  Knows h'es leaving tomorrow to go to SNF.  Hasn't decided what to do about COVID vaccine- since he hasn't, cannot give before he leaves.   Hasn't asked for Albuterol and doesn't have flutter valve- spoke with nursing about it.   Still having multiple loose BMs/day.     ROS:   Pt denies SOB, abd pain, CP, N/V/C/D, and vision changes   Objective:   No results found. Recent Labs    07/14/20 0534 07/14/20 1753 07/15/20 0504  WBC 9.0  --   --   HGB 9.7* 10.6* 10.1*  HCT 32.4* 35.0* 33.6*  PLT 255  --   --    Recent Labs    07/14/20 0534 07/15/20 1422  NA 135 134*  K 3.7 3.3*  CL 99 99  CO2 26 27  GLUCOSE 91 120*  BUN 20 19  CREATININE 4.54* 4.29*  CALCIUM 8.7* 8.0*    Intake/Output Summary (Last 24 hours) at 07/15/2020 1657 Last data filed at 07/15/2020 1628 Gross per 24 hour  Intake 702 ml  Output 1400 ml  Net -698 ml     Pressure Injury 05/01/20 Buttocks Left Stage 4 - Full thickness tissue loss with exposed bone, tendon or muscle. packed with santyl gauze, dressed by WOCN, (Active)  05/01/20 0230  Location: Buttocks  Location Orientation: Left  Staging: Stage 4 - Full thickness tissue loss with exposed bone, tendon or muscle.  Wound Description (Comments): packed with santyl gauze, dressed by WOCN,  Present on Admission: Yes     Pressure Injury 05/01/20 Ankle Left;Lateral Unstageable - Full thickness tissue loss in which the base of the injury is covered by slough (yellow, tan, gray, green or brown) and/or eschar (tan, brown or black) in the wound bed. smal circular wound on the (Active)  05/01/20 0230  Location: Ankle  Location Orientation: Left;Lateral  Staging: Unstageable - Full thickness tissue loss in which the base of the injury is covered by slough (yellow, tan, gray, green or brown) and/or eschar (tan, brown or black)  in the wound bed.  Wound Description (Comments): smal circular wound on the bone  Present on Admission: Yes     Pressure Injury 05/01/20 Foot Left;Lateral Unstageable - Full thickness tissue loss in which the base of the injury is covered by slough (yellow, tan, gray, green or brown) and/or eschar (tan, brown or black) in the wound bed. 4th and 5th toes appear nec (Active)  05/01/20 0230  Location: Foot (4th and 5th toes and lateral/anterior side of the sole)  Location Orientation: Left;Lateral  Staging: Unstageable - Full thickness tissue loss in which the base of the injury is covered by slough (yellow, tan, gray, green or brown) and/or eschar (tan, brown or black) in the wound bed.  Wound Description (Comments): 4th and 5th toes appear necrotic circular wound on sole  Present on Admission: Yes     Pressure Injury 05/01/20 Foot Right;Medial;Posterior Unstageable - Full thickness tissue loss in which the base of the injury is covered by slough (yellow, tan, gray, green or brown) and/or eschar (tan, brown or black) in the wound bed. large circular wo (Active)  05/01/20 0230  Location: Foot  Location Orientation: Right;Medial;Posterior  Staging: Unstageable - Full thickness tissue loss in which the base of the injury is covered by slough (yellow, tan, gray, green or brown) and/or eschar (tan, brown or black) in the  wound bed.  Wound Description (Comments): large circular wound on sole  and heel  Present on Admission: Yes     Pressure Injury 05/08/20 Heel Right Unstageable - Full thickness tissue loss in which the base of the injury is covered by slough (yellow, tan, gray, green or brown) and/or eschar (tan, brown or black) in the wound bed. (Active)  05/08/20 1718  Location: Heel  Location Orientation: Right  Staging: Unstageable - Full thickness tissue loss in which the base of the injury is covered by slough (yellow, tan, gray, green or brown) and/or eschar (tan, brown or black) in the wound  bed.  Wound Description (Comments):   Present on Admission: Yes    Physical Exam: Vital Signs Blood pressure (!) 148/89, pulse 87, temperature 98 F (36.7 C), temperature source Oral, resp. rate 16, height 6' 6"  (1.981 m), weight 110.8 kg, SpO2 97 %. Gen: woke up, still sleepy; flat affect, supine in bed, NAD HEENT: oral mucosa pink and moist, NCAT Cardio: RRR- no JVD Chest: CTA B/L- no W/R/R- good air movement- no cough heard this AM Abd: soft, but sore per pt- mild TTP, normoactive BS this AM Extremities: edema in extremities.  No tenderness in extremities. Neuro: sleepy/dull affect Psychiatric: dull/flat affect Motor: Bilateral upper extremities: 5/5 proximal distal Bilateral lower extremities: 4-/5 proximal distal   Skin: Multiple foot wounds. L foot- has ruptured blood blister 5th MTP base; also 4th toe is dry gangrene- all black eschar. Large sacral wound- no change Feet wounds look the same- no significant improvement in healing since seen last time  Assessment/Plan: 1. Functional deficits which require 3+ hours per day of interdisciplinary therapy in a comprehensive inpatient rehab setting.  Physiatrist is providing close team supervision and 24 hour management of active medical problems listed below.  Physiatrist and rehab team continue to assess barriers to discharge/monitor patient progress toward functional and medical goals  Care Tool:  Bathing    Body parts bathed by patient: Right arm,Left arm,Chest,Abdomen,Face,Right upper leg,Left upper leg,Right lower leg,Left lower leg   Body parts bathed by helper: Front perineal area,Buttocks     Bathing assist Assist Level: Minimal Assistance - Patient > 75%     Upper Body Dressing/Undressing Upper body dressing   What is the patient wearing?: Pull over shirt    Upper body assist Assist Level: Set up assist    Lower Body Dressing/Undressing Lower body dressing      What is the patient wearing?: Pants      Lower body assist Assist for lower body dressing: Minimal Assistance - Patient > 75%     Toileting Toileting    Toileting assist Assist for toileting: Minimal Assistance - Patient > 75% (per staff report) Assistive Device Comment:  (urinal)   Transfers Chair/bed transfer  Transfers assist  Chair/bed transfer activity did not occur: Safety/medical concerns (Per nursing report)  Chair/bed transfer assist level: Maximal Assistance - Patient 25 - 49% Chair/bed transfer assistive device: Other (Stedy)   Locomotion Ambulation   Ambulation assist   Ambulation activity did not occur: Safety/medical concerns (unable to achieve standing w/RW w/max assist)  Assist level: Minimal Assistance - Patient > 75% Assistive device: Parallel bars Max distance: 24 ft   Walk 10 feet activity   Assist  Walk 10 feet activity did not occur: Safety/medical concerns (fatigue, lightheadedness, generalized weakness)  Assist level: Minimal Assistance - Patient > 75% Assistive device: Parallel bars   Walk 50 feet activity   Assist Walk 50 feet with  2 turns activity did not occur: Safety/medical concerns (fatigue, lightheadedness, generalized weakness)         Walk 150 feet activity   Assist Walk 150 feet activity did not occur: Safety/medical concerns (fatigue, lightheadedness, generalized weakness)         Walk 10 feet on uneven surface  activity   Assist Walk 10 feet on uneven surfaces activity did not occur: Safety/medical concerns         Wheelchair     Assist Will patient use wheelchair at discharge?: Yes Type of Wheelchair: Manual Wheelchair activity did not occur: Refused  Wheelchair assist level: Supervision/Verbal cueing Max wheelchair distance: 100    Wheelchair 50 feet with 2 turns activity    Assist        Assist Level: Supervision/Verbal cueing   Wheelchair 150 feet activity     Assist      Assist Level: Minimal Assistance - Patient  > 75%   Blood pressure (!) 148/89, pulse 87, temperature 98 F (36.7 C), temperature source Oral, resp. rate 16, height 6' 6"  (1.981 m), weight 110.8 kg, SpO2 97 %.  Medical Problem List and Plan: 1.  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 Due to participation.  2/22- discussed with pt about risks/benefits of COVID vaccine- strongly recommend esp because could allow him to go  A nursing home closer to home.  2/4- pt won't get COVID vaccine before d/c- he hasn't decided if wants it yet.   2.  Antithrombotics: -DVT/anticoagulation: Continue Subcutaneous heparin.               -antiplatelet therapy: N/A 3. Pain Management: Continue Tylenol as needed, last received 2/20 AM  2/11- denied pain- con't regimen  2/16- having abdominal pain but controlled with tylenol 4. Mood: Provide emotional support             -antipsychotic agents: N/A 5. Neuropsych: This patient is? capable of making decisions on his own behalf. 6. Skin/Wound Care: Routine skin checks 7. Fluids/Electrolytes/Nutrition: Routine in and outs with follow-up chemistries 8.  ESRD.  Hemodialysis per renal services 9.  Sacral decubitus ulcer as well as ischemic ulcers on the left foot fourth toe and fifth metatarsal head and right heel and fifth metatarsal head.  Wound care nurse follow-up.  Patient to see Dr. Sharol Given outpatient.  Continue bilateral PRAFO's. 10.  ID/ coccygeal osteomyelitis.    Completed vancomycin and cefepime on 06/10/2020  1/18- will schedule turning q2 hours to get off wounds- also will make sure on specialty bed  2/7- CRP 6.5- ESR pending - con't regimen  2/8- ESR  DOWN TO 67 FROM 73- SO HEADING IN RIGHT DIRECTION  2/9- Spiked temp last night of 100.9- restarted Vanc/Cefipime- pt refused Vanc "since causes diarrhea". WBC now normal again this AM- will con't for now. CXR (-); is anuric-  2/10- having  fevers/sweats/chills- Tm 99.4- needs to also take Vanc- told this to pt- also, called ID and also ordered another CT of pelvis due to Sx's-    2/13 ID saw pt Friday. Thinks that low grade temps are coming from HD cath. Was not impressed by MRI of pelvis. Recommended stopping abx (d/t loose stool)   -continue to monitor   -continue [facility] per nephrology   -afebrile last 36 hours  2/18- won't stay off backside- is not healing - even with encouragement, not turning off backside   2/24-  laying supine every morning- not off sacral wound- doesn't take cues to fix it.  10.  Anemia of chronic disease.  Continue Aranesp.   Hemoglobin 10.0 on 2/3, labs with HD 11.  Diabetes mellitus with peripheral neuropathy .  Hemoglobin A1c 5.6.  Lantus insulin 11 units nightly  CBG (last 3)  Recent Labs    07/14/20 2113 07/15/20 0538 07/15/20 1206  GLUCAP 122* 96 101*    2/24- BGs great- con't regimen 12.  Morbid obesity.  BMI 38.37.  Dietary follow-up 13. Dizziness/nausea with HTN and hypotension  Vitals:   07/15/20 1600 07/15/20 1628  BP: 139/87 (!) 148/89  Pulse: 89 87  Resp:  16  Temp:  98 F (36.7 C)  SpO2:  97%    Hydralazine prn   Norvasc to 7.5 mg daily   2/9- BP 156/85 this AM- but has been controlled last few days  2/12-13-bps still with some fluctuation but not severe. Continue current meds, volume mgt per nephrology  2/15- BP's 150s-160s/90s- on Norvasc 7.5- can increase, but usually has orthostatic hypotension when we do. Will d/w Renal  2/16- "passed out" yesterday= will call renal   2/17- renal note shows they will not pull off fluid today in HD due to "passing out event" and diarrhea fluid losses  2/23- BP in 150s/90s  2/24- per chart, no documentation of issues in last 24 hours- but heard that he might have been dizzy again today- will check in AM 14. Diarrhea with new constipation  Bowel meds increased on 2/5  2/7- will decrease bowel meds again since 8+ BMs in last 24 hours- con't  regimen  2/12 stool still mushy. Stopped miralax  2/14- will allow them to use the Imodium prn that's ordered- since still having so many stools- he does this from IV ABX  2/18- has C diff- off Imodium 15. Leukocytosis: Resolved  ESR/CRP and CBC with diff ordered for Monday.  2/7- CRP 6.5- stable- con't regimen   2/8- EST 67- down to 73- con't regimen  2/11- Labs looking slightly better, but imaging does not- waiting to hear from ID  2/14- per ID, pelvis osteomyelitis not the issue- off ABX per their request 16. N/Vomiting-  2/10- likely due to underlying illness- will do ID w/u as above.  17. 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   2/16- abdominal pain slightly better  2/17- BMs less frequent per pt and less abd pain- con't regimen- will recheck labs   2/18- WBC down to 8.7- no Left shift- 1 watery BM today- is doing better- except had Tm 100.5 overnight- if spikes again, might need to call ID back.  2/21- No elevated WBC now- 2-3 loose/liquid BMs in last 24 hours.   2/22- 5+ loose/liquid BMs in last 24 hours- will check with ID to see if should increase Vanc dose, any other ideas- was dx'd 1 week ago.    2/23- increased Vanc to 250 mg PO QID per d/w Dr Juleen China- Pharmacy wanted 500 mg QID and changed him to 500 mg QID  2/24- still having loose stools- just increased Vanc 2/22- so will likely take a few days.  16. Dispo  2/8- pt unhappy that has to back to SNF- however has been participating intermittently- finally doing more/more interactive, but we can only keep max of 30 days. Sent FL2  2/9-  pt refused PT yesterday -only bedlevel Exercises- pt needs max encouragement to progress with therapy   2/10- might have to hold d/c- we will see.  2/11- might not be able to d/c Monday per SNF search/SW   2/15- told SW that cannot d/c right now due to C Diff.  2/21- will call sister  tomorrow- cannot since going to clinic today.  2/23- discussed bowel program to start after C Diff and COVID Vaccine    2/24- suggested to pt once C Diff done, suggested the suppository nightly to clean him out so will have fewer accidents. D/c tomorrow to SNF planned. 17. Depression  2/22- d/w sister- will try Celexa 20 mg daily  To help since doesn't want to do therapy/participate.  18. Lingering cough  2/23- add Flutter valve and Albuterol prn inhaler.   2/22- Called sister about medical issues. Discussed in depth - for an additional 20 minutes. Total 35 minutes on care- >50% coordination of care.      LOS: 39 days A FACE TO FACE EVALUATION WAS PERFORMED  Fred [name] 07/15/2020, 4:57 PM

## 2020-07-15 NOTE — Progress Notes (Addendum)
Patient ID: Fred [name], male   DOB: 1970/10/14, 50 y.o.   MRN: [id number] Spoke with [name] who reports admission after lunch tomorrow due to they have a discharge tomorrow. Sister to sign the admission papers. Will set up [facility] for 2:00 transport.  1:17 PM Assisted pt with completing admission forms for SNF. Pt and sister aware of plan for transfer tomorrow around 2:00.

## 2020-07-15 NOTE — Progress Notes (Signed)
[facility] Progress Note   Subjective:  Patient seen and examined at bedside.  No new complaints.  Plan for d/c tomorrow.  Denies CP, SOB, abdominal pain and n/v.    Objective Vitals:   07/14/20 1124 07/14/20 1537 07/14/20 1935 07/15/20 0508  BP: (!) 159/94 (!) 136/92 (!) 152/93 (!) 153/93  Pulse: 95 94 90 93  Resp: '18 16 17 18  '$ Temp: 99.5 F (37.5 C) 99.3 F (37.4 C) 98.9 F (37.2 C) 98.6 F (37 C)  TempSrc: Oral Oral    SpO2: 100% 99% 99% 99%  Weight:      Height:       Physical Exam General:Well developed, ill appearing male, laying in bed in NAD  Heart:RRR, no mrg Lungs:CTAB, nml WOB Abdomen:soft, NTND Extremities:no LE edema Dialysis Access: [facility] c/d/i   [facility] Weights   07/08/20 1630 07/10/20 1245 07/10/20 1530  Weight: 115.5 kg 116 kg 115 kg    Intake/Output Summary (Last 24 hours) at 07/15/2020 0955 Last data filed at 07/14/2020 1818 Gross per 24 hour  Intake 222 ml  Output 100 ml  Net 122 ml    Additional Objective Labs: Basic Metabolic Panel: Recent Labs  Lab 07/10/20 1239 07/12/20 0611 07/14/20 0534  NA 134* 136 135  K 4.6 4.1 3.7  CL 99 101 99  CO2 '27 28 26  '$ GLUCOSE 103* 83 91  BUN 29* 27* 20  CREATININE 5.75* 5.72* 4.54*  CALCIUM 9.1 8.8* 8.7*  PHOS 4.2 4.2 3.4   Liver Function Tests: Recent Labs  Lab 07/10/20 1239 07/12/20 0611 07/14/20 0534  ALBUMIN 1.9* 1.9* 1.9*   CBC: Recent Labs  Lab 07/08/20 1322 07/09/20 0509 07/09/20 1656 07/10/20 1239 07/10/20 1653 07/12/20 0611 07/12/20 1628 07/14/20 0534 07/14/20 1753 07/15/20 0504  WBC 8.3 8.7  --  9.1  --  10.8*  --  9.0  --   --   NEUTROABS  --  5.3  --   --   --  7.7  --  5.7  --   --   HGB 9.9* 10.2*   < > 10.0*   < > 10.7*   < > 9.7* 10.6* 10.1*  HCT 32.1* 33.8*   < > 33.2*   < > 34.3*   < > 32.4* 35.0* 33.6*  MCV 82.9 83.0  --  83.8  --  82.7  --  82.2  --   --   PLT 295 292  --  327  --  295  --  255  --   --    < > = values in this interval not  displayed.   CBG: Recent Labs  Lab 07/14/20 0604 07/14/20 1121 07/14/20 1623 07/14/20 2113 07/15/20 0538  GLUCAP 91 105* 128* 122* 96    Studies/Results: No results found.  Medications:  . amLODipine  5 mg Oral QHS  . Chlorhexidine Gluconate Cloth  6 each Topical BID  . Chlorhexidine Gluconate Cloth  6 each Topical Q0600  . Chlorhexidine Gluconate Cloth  6 each Topical Q0600  . citalopram  20 mg Oral Daily  . collagenase   Topical Daily  . darbepoetin (ARANESP) injection - DIALYSIS  100 mcg Intravenous Q Tue-HD  . feeding supplement (NEPRO CARB STEADY)  237 mL Oral TID BM  . fluticasone  2 spray Each Nare Daily  . heparin injection (subcutaneous)  7,500 Units Subcutaneous Q8H  . insulin aspart  0-9 Units Subcutaneous TID WC  . insulin glargine  11 Units Subcutaneous  QHS  . lanthanum  500 mg Oral TID WC  . multivitamin  1 tablet Oral QHS  . pantoprazole  40 mg Oral Q0600  . pneumococcal 23 valent vaccine  0.5 mL Intramuscular Tomorrow-1000  . vancomycin  500 mg Oral QID    Dialysis Orders: NWTTS 4h 400/800 143.5kg 3K/2.5Ca TDCHeparin3000units IV TIW - Mircera128mgIVq2wks - last 12/8 - Venofer '100mg'$ IVqHD x10 - completed 6/10 doses  Assessment/Plan: 1. History of prolonged hospitalization with COVID-19 and consequent debilitation/sacrococcygeal decubitus/osteomyelitis:Status postcompletion of Vanc/Cefepime (06/10/20). In CIR. Plan to d/c to SNF tomorrow.  2. Clostridium difficile associated diarrhea: on oral vancomycin with continued diarrhea and occasional nausea. PRN imodium and zofran/compazine noted.  3. ESRD: HD TTS. HD today per regular schedule.  4. HTN/volume:BP controlled. On amlodipine. Hydralazineordered prn but has not needed.Some issues throughout stay with orthostatic hypotension. Seems improved. No gross volume excess on exam. Minimal UF on HD.  5. Anemia:Hgb stable, 10.1 today.IV iron ordered. Last ESA 2045m aranesp on 2/15,  will lower dose. 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  7. Nutrition: Renal diet with fluid restriction. Cont prot supp for low albumin. 8. DMT2 - per admit   LiJen MowPA-C [place]/24/2022,9:55 AM  LOS: 39 days

## 2020-07-15 NOTE — Plan of Care (Signed)
  Problem: Consults Goal: RH GENERAL PATIENT EDUCATION Description: See Patient Education module for education specifics. Outcome: Progressing Goal: Skin Care Protocol Initiated - if Braden Score 18 or less Description: If consults are not indicated, leave blank or document N/A Outcome: Progressing   Problem: RH BOWEL ELIMINATION Goal: RH STG MANAGE BOWEL WITH ASSISTANCE Description: STG Manage Bowel with min Assistance. Outcome: Progressing Goal: RH STG MANAGE BOWEL W/MEDICATION W/ASSISTANCE Description: STG Manage Bowel with Medication with min Assistance. Outcome: Progressing   Problem: RH SKIN INTEGRITY Goal: RH STG SKIN FREE OF INFECTION/BREAKDOWN Description: Skin to remain free from infection and breakdown with min assist. Outcome: Progressing Goal: RH STG MAINTAIN SKIN INTEGRITY WITH ASSISTANCE Description: STG Maintain Skin Integrity With min Assistance. Outcome: Progressing Goal: RH STG ABLE TO PERFORM INCISION/WOUND CARE W/ASSISTANCE Description: STG Able To Perform Incision/Wound Care With min Assistance. Outcome: Progressing   Problem: RH SAFETY Goal: RH STG ADHERE TO SAFETY PRECAUTIONS W/ASSISTANCE/DEVICE Description: STG Adhere to Safety Precautions With min Assistance/Device. Outcome: Progressing   Problem: RH PAIN MANAGEMENT Goal: RH STG PAIN MANAGED AT OR BELOW PT'S PAIN GOAL Description: <4 on a 0-10 pain scale. Outcome: Progressing   Problem: RH KNOWLEDGE DEFICIT GENERAL Goal: RH STG INCREASE KNOWLEDGE OF SELF CARE AFTER HOSPITALIZATION Description: Patient will be able to demonstrate knowledge of medication management, dietary management, wound care management, with educational materials and handouts with cues and reminders. Outcome: Progressing

## 2020-07-15 NOTE — Progress Notes (Signed)
Physical Therapy Discharge Summary  [name] MRN: [id number] Date of Birth: 05/26/1970  Today's Date: 07/15/2020 PT Individual Time: 1100-1150 PT Individual Time Calculation (min): 50 min    Patient has met 1 of 11 long term goals due to improved balance.  Patient to discharge at a wheelchair level Max [facility]ist.   Patient's care partner is unable to care for pt, transferring to SNF to provide the necessary physical assistance at discharge.  Reasons goals not met: Pt was progressing towards all LTGs w/good progress.  However, Pt contracted Cdiff and declined functionally w/poor tolerance for PT intervention in IPR setting.  Recommendation:  Patient will benefit from ongoing skilled PT services in skilled nursing facility setting to continue to advance safe functional mobility, address ongoing impairments in endurance, strength, balance, transfers, gait, wc moblity, and minimize fall risk.  Equipment: No equipment provided  Reasons for discharge: SNF placement  Patient/family agrees with progress made and goals achieved: Yes   Skilled Therapeutic intervention: Pt seen bedside for completion of DC assessment as tolerated and the following treatment. Pt supine to sit on edge of bed w/supervison and use of bedrail.  Pt able to maintain sitting entire session w/supervision due to intermittent c/o dizzyness during session. BP monitored in sitting and standing and remained steady in all positions with minimal changes: 124-128/78-81 Attempted Sit to stand from edge of bed to RW, mult efforts w/max assist but unable to achieve upright. Pt returned sidelying using bedrail w/supervision for rest break while therapist obtained Stedy. Supine to sit w/rail and supervision. Sits several min performing LE AROM. Sit to stand in stedy w/heavy mod assist of 1 and cues for full upright standing posture.   Repeated Sit to stand x 3 and stood 1 min, 45 sec, 30 sec w/signficant  increased overall sway/post tendency. Pt returned to sitting, sat w/supervision several min.  Sit to supine w/supervision and rail. Pt repositioned comfortably in sidelying and left w/rails up x 4 and needs in reach.    PT Discharge Precautions/Restrictions   Vital Signs Therapy Vitals Temp: 98.6 F (37 C) Pulse Rate: 93 Resp: 18 BP: (!) 153/93 Patient Position (if appropriate): Lying Oxygen Therapy SpO2: 99 % O2 Device: Room Air Cognition Overall Cognitive Status: Within Functional Limits for tasks assessed Arousal/Alertness: Awake/alert Orientation Level: Oriented X4 Safety/Judgment: Appears intact Motor  Motor Motor: Abnormal postural alignment and control Motor - Skilled Clinical Observations: grossly uncoordinated d/t decreased postural control and prolonged time in bed but improved from eval  Mobility Bed Mobility Bed Mobility: Rolling Right;Rolling Left;Sit to Sidelying Left;Left Sidelying to Sit Rolling Right: Supervision/verbal cueing Rolling Left: Supervision/Verbal cueing Right Sidelying to Sit: Supervision/Verbal cueing Left Sidelying to Sit: Supervision/Verbal cueing Supine to Sit: Supervision/Verbal cueing Sit to Supine: Supervision/Verbal cueing Sit to Sidelying Left: Supervision/Verbal cueing Transfers Transfers: Transfer via Geophysicist/field seismologist (Stedy, mod assist) Sit to Stand: Dependent - mechanical lift (mod assist w/stedy, unable max assist w/RW) Lateral/Scoot Transfers: Dependent - mechanical lift (mechanical lift Stedy at last attempt with PT, per staff report) Locomotion  Gait Ambulation: No Gait Gait: No Stairs / Additional Locomotion Stairs: No Wheelchair Mobility Wheelchair Mobility: No Wheelchair Assistance: Other (comment) (refused transfer to wc)  Trunk/Postural Assessment  Cervical Assessment Cervical Assessment: Within Functional Limits Thoracic Assessment Thoracic Assessment: Within Functional Limits Lumbar Assessment Lumbar  Assessment: Exceptions to [facility] Postural Control Postural Control: Deficits on evaluation  Balance Balance Balance Assessed: Yes Static Sitting Balance Static Sitting - Balance Support: Feet supported Static  Sitting - Level of Assistance: 5: Stand by assistance Static Sitting - Comment/# of Minutes: due to c/o dizzyness, no balance loss or increased sway w/activities. Dynamic Sitting Balance Dynamic Sitting - Balance Support: Feet supported;During functional activity Dynamic Sitting - Level of Assistance: 5: Stand by assistance Dynamic Sitting - Balance Activities: Lateral lean/weight shifting;Forward lean/weight shifting;Reaching across midline Sitting balance - Comments: Supervision sitting EOB d/t recent dizziness/lightheadedness Static Standing Balance Static Standing - Balance Support: Bilateral upper extremity supported Static Standing - Level of Assistance: 3: Mod assist Static Standing - Comment/# of Minutes: static standing approx 10 seconds at RW Extremity Assessment  RUE Assessment RUE Assessment: Within Functional Limits LUE Assessment LUE Assessment: Within Functional Limits RLE Assessment RLE Assessment: Exceptions to [facility] General Strength Comments: RLE: ankle DF: 3+/5, knee ext 3+/5, hip flex 3+/5 LLE Assessment LLE Assessment: Exceptions to [facility] General Strength Comments: LLE: ankle DF: 4-/5, knee ext 4-/5, hip flex 3+/5    Jerrilyn Cairo 07/15/2020, 8:04 AM

## 2020-07-15 NOTE — Progress Notes (Signed)
Physical Therapy Session Note  [name] MRN: [id number] Date of Birth: 1970-08-24  Today's Date: 07/15/2020 PT Individual Time: 1000-1028 PT Individual Time Calculation (min): 28 min   Short Term Goals: Week 6:  PT Short Term Goal 1 (Week 6): pt to demonstrate to [facility] mobility at 63' CGA PT Short Term Goal 2 (Week 6): pt to demonstrate min A functional transfers consistently PT Short Term Goal 3 (Week 6): pt to be able to direct all care needs for transfer setup, equipment, and skin intregity  Skilled Therapeutic Interventions/Progress Updates:    Pt greeted supine in bed with legs hanging off of bed and his pants half-way pulled over his thighs. Pt reports he wasn't trying to get out of bed but was attempting to get comfortable. BP assessed supine and seated EOB, see results below. Performed supine<>sit with supervision with use of bed rail, slowed and effortful movement patterns. C/o initial dizziness upon sitting but this resolves gradually with time for acclimation. Performed seated there-ex including LAQ, hip marches, heel raises, shldr press with 3# dowel rod, and resistive chest press - 1x20 reps. Patient performed x1 sit<.stand required maxA and B knee blocking as legs were sliding forward during effort. Unable to achieve full upright and relying quite heavily on back of legs against bed. Pt returned to seated position and then self selected return to supine. BP assessed and below. Family member called patient and patient able to answer cell phone. He ended session supine in bed speaking with family member on phone. Needs within reach.  BP: -supine: 160/96 (115) HR 92 -seated: 127/91 (100) HR 101 -5 min seated: 113/90 (91) HR 98 -return to supine: 142/94 (109) HR 97  Therapy Documentation Precautions:  Precautions Precautions: Fall Precaution Comments: wounds on bilat feet and L sacrum, inconsistent orthostatic BPs Required Braces or Orthoses: Other  Brace Other Brace: abdominal binder PRN with standing/strenuous activity Restrictions Weight Bearing Restrictions: No  Therapy/Group: Individual Therapy  Alynna Hargrove P Jonanthan Bolender PT 07/15/2020, 7:02 AM

## 2020-07-15 NOTE — Progress Notes (Signed)
Occupational Therapy Session Note  [name] MRN: [id number] Date of Birth: Mar 09, 1971  Today's Date: 07/15/2020 OT Individual Time: [id number] OT Individual Time Calculation (min): 43 min    Short Term Goals: Week 4:  OT Short Term Goal 1 (Week 4): STGs=LTGs due to ELOS OT Short Term Goal 1 - Progress (Week 4): Progressing toward goal Week 5:  OT Short Term Goal 1 (Week 5): STGs=LTGs due to ELOS  Skilled Therapeutic Interventions/Progress Updates:    Pt greeted at time of session supine in bed resting, breakfast not eaten at bedside. Agreeable to OT session with encouragement. Declined bathing/dressing today despite efforts. Supine > sit Supervision and mild to no dizziness, performed oral hygiene, face washing, and shaving face at EOB approx 20 minutes sitting with set up/supervision. Performed seated there ex w/ 3# dowel 1x20 reps bicep curl, chest press push/pull with resistance and overhead. Cues for form and posture. Pt feeling fatigued at this time, encouraged to eat and would only eat a few bites of banana seated EOB. Sit > supine supervision for brief change at bed level, dependent brief and hygiene but rolling with S. Pt aware of upcoming DC to SNF, encouraged to participate there for max benefit and in prep for home. Call bell in reach.   Therapy Documentation Precautions:  Precautions Precautions: Fall Precaution Comments: wounds on bilat feet and L sacrum, inconsistent orthostatic BPs Required Braces or Orthoses: Other Brace Other Brace: abdominal binder PRN with standing/strenuous activity Restrictions Weight Bearing Restrictions: No     Therapy/Group: Individual Therapy  [name] 07/15/2020, 9:11 AM

## 2020-07-16 LAB — GLUCOSE, CAPILLARY
Glucose-Capillary: 86 mg/dL (ref 70–99)
Glucose-Capillary: 90 mg/dL (ref 70–99)
Glucose-Capillary: 87 mg/dL (ref 70–99)

## 2020-07-16 LAB — HEMOGLOBIN AND HEMATOCRIT, BLOOD
HCT: 34 % — ABNORMAL LOW (ref 39.0–52.0)
HCT: 35.1 % — ABNORMAL LOW (ref 39.0–52.0)
Hemoglobin: 10.2 g/dL — ABNORMAL LOW (ref 13.0–17.0)
Hemoglobin: 11 g/dL — ABNORMAL LOW (ref 13.0–17.0)

## 2020-07-16 MED ORDER — CHLORHEXIDINE GLUCONATE CLOTH 2 % EX PADS
6.0000 | MEDICATED_PAD | Freq: Every day | CUTANEOUS | Status: DC
  Administered 2020-07-16: 6 via TOPICAL

## 2020-07-16 NOTE — Progress Notes (Signed)
[facility] Progress Note   Subjective:   Patient seen and examined at bedside.  To be discharged to SNF today.  Not overly thrilled about it.  Reports ongoing dizziness with sitting/standing.  Advised to make slow transitions when changing positions.  Denies CP and SOB.   Objective Vitals:   07/15/20 1628 07/15/20 1731 07/15/20 2024 07/16/20 0400  BP: (!) 148/89 (!) 150/90 134/80 (!) 169/94  Pulse: 87 97 (!) 101 94  Resp: '16 18 17 16  '$ Temp: 98 F (36.7 C) 98.8 F (37.1 C) 99 F (37.2 C) 99.8 F (37.7 C)  TempSrc: Oral   Oral  SpO2: 97%  98% 100%  Weight: 110.8 kg     Height:       Physical Exam General:well developed male in NAD, tearful Heart:RRR, no mrg Lungs:CTAB, nml WOB Abdomen:soft, NTND Extremities:no LE edema Dialysis Access: [facility]   Filed Weights   07/10/20 1530 07/15/20 1323 07/15/20 1628  Weight: 115 kg 112 kg 110.8 kg    Intake/Output Summary (Last 24 hours) at 07/16/2020 1043 Last data filed at 07/15/2020 1750 Gross per 24 hour  Intake 480 ml  Output 1400 ml  Net -920 ml    Additional Objective Labs: Basic Metabolic Panel: Recent Labs  Lab 07/12/20 0611 07/14/20 0534 07/15/20 1422  NA 136 135 134*  K 4.1 3.7 3.3*  CL 101 99 99  CO2 '28 26 27  '$ GLUCOSE 83 91 120*  BUN 27* 20 19  CREATININE 5.72* 4.54* 4.29*  CALCIUM 8.8* 8.7* 8.0*  PHOS 4.2 3.4 2.7   Liver Function Tests: Recent Labs  Lab 07/12/20 0611 07/14/20 0534 07/15/20 1422  ALBUMIN 1.9* 1.9* 2.0*   CBC: Recent Labs  Lab 07/10/20 1239 07/10/20 1653 07/12/20 0611 07/12/20 1628 07/14/20 0534 07/14/20 1753 07/15/20 0504 07/15/20 1713 07/16/20 0456  WBC 9.1  --  10.8*  --  9.0  --   --   --   --   NEUTROABS  --   --  7.7  --  5.7  --   --   --   --   HGB 10.0*   < > 10.7*   < > 9.7*   < > 10.1* 10.5* 10.2*  HCT 33.2*   < > 34.3*   < > 32.4*   < > 33.6* 33.4* 34.0*  MCV 83.8  --  82.7  --  82.2  --   --   --   --   PLT 327  --  295  --  255  --   --   --    --    < > = values in this interval not displayed.   CBG: Recent Labs  Lab 07/15/20 0538 07/15/20 1206 07/15/20 1723 07/15/20 2106 07/16/20 0608  GLUCAP 96 101* 85 94 86    Medications:  . amLODipine  5 mg Oral QHS  . Chlorhexidine Gluconate Cloth  6 each Topical BID  . Chlorhexidine Gluconate Cloth  6 each Topical Q0600  . Chlorhexidine Gluconate Cloth  6 each Topical Q0600  . citalopram  20 mg Oral Daily  . collagenase   Topical Daily  . darbepoetin (ARANESP) injection - DIALYSIS  100 mcg Intravenous Q Tue-HD  . feeding supplement (NEPRO CARB STEADY)  237 mL Oral TID BM  . fluticasone  2 spray Each Nare Daily  . heparin injection (subcutaneous)  7,500 Units Subcutaneous Q8H  . insulin aspart  0-9 Units Subcutaneous TID WC  . insulin glargine  11 Units Subcutaneous QHS  . lanthanum  500 mg Oral TID WC  . multivitamin  1 tablet Oral QHS  . pantoprazole  40 mg Oral Q0600  . pneumococcal 23 valent vaccine  0.5 mL Intramuscular Tomorrow-1000    Dialysis Orders: NWTTS 4h 400/800 143.5kg 3K/2.5Ca TDCHeparin3000units IV TIW - Mircera168mgIVq2wks - last 12/8 - Venofer '100mg'$ IVqHD x10 - completed 6/10 doses  Assessment/Plan: 1. History of prolonged hospitalization with COVID-19 and consequent debilitation/sacrococcygeal decubitus/osteomyelitis:Status postcompletion of Vanc/Cefepime (06/10/20). In CIR. Plan to d/c to SNF today. 2. Clostridium difficile associated diarrhea: on oral vancomycin with continued diarrhea and occasional nausea. PRN imodium and zofran/compazine noted.  3. ESRD: HD TTS. Will write orders for HD tomorrow per regular schedule in case remains admitted.  Can complete dialysis at OP center if discharged.  4. HTN/volume:BP controlled. On amlodipine. Hydralazineordered prn but has not needed.Ongoing issues with orthostatic hypotension. Discussed slow transitions. No gross volume excess on exam. Minimal UF on HD.  5. Anemia:Hgb stable, 10.2  today.IV iron ordered. Last ESA 2046m aranesp on 2/15, will lower dose. 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  7. Nutrition: Renal diet with fluid restriction. Cont prot supp for low albumin. 8. DMT2 - per admit  LiJen MowPA-C Ca[place]embine/25/2022,10:43 AM  LOS: 40 days

## 2020-07-16 NOTE — Progress Notes (Signed)
Inpatient Rehabilitation Care Coordinator Discharge Note  The overall goal for the admission was met for:   Discharge location: No-RETURNING TO [facility]INCE COULD NOT FIND [facility] CLOSER TO HIS HOME THAT WOULD OFFER A BED  Length of Stay: No-40 DAYS  Discharge activity level: No-MIN-MOD LEVEL  Home/community participation: Yes  Services provided included: MD, RD, PT, OT, SLP, RN, CM, TR, Pharmacy, Neuropsych and SW  Financial Services: Medicare and Medicaid  Choices offered to/list presented to:yes  Follow-up services arranged: Other: RETURNING TO SNF HE IS TOO MUCH CARE FOR 55 YO DAD  Comments (or additional information):DAD AND SISTER CAME IN AND OBSERVED IN THERAPIES, HE IS TOO MUCH CARE FOR DAD AND SISTER AND NEEDS TO BE MOBILE DUE TO HOME NOT WHEELCHAIR ACCESSIBLE. PT AWARE AND HOPES TO REACH THIS LEVEL WITH MORE REHAB. AWARE NEEDS TO PUSH HIMSELF AND GET OUT OF THE BED. WHEN NOT FEELING WELL HE TENDS TO STAY IN THE BED FOR DAYS AND THIS AFFECTS [name]'S AND NEGATES THE PROGRESS HE HAD MADE. HE STILL THINKING ABOUT GETTING THE COVID VACCINE BUT HAS NOT ASKED TO GET IT WHILE HERE.  Patient/Family verbalized understanding of follow-up arrangements: Yes  Individual responsible for coordination of the follow-up plan: [name]-SISTER [phone]  Confirmed correct DME delivered: Elease Hashimoto 07/16/2020    Seabron Iannello, Gardiner Rhyme

## 2020-07-16 NOTE — Progress Notes (Signed)
PHYSICAL MEDICINE & REHABILITATION PROGRESS NOTE   Subjective/Complaints:  Sleepy -knows he's leaving today; no issues except loose stools.    ROS:   Pt denies SOB, abd pain, CP, N/V/C/D, and vision changes   Objective:   No results found. Recent Labs    07/14/20 0534 07/14/20 1753 07/15/20 1713 07/16/20 0456  WBC 9.0  --   --   --   HGB 9.7*   < > 10.5* 10.2*  HCT 32.4*   < > 33.4* 34.0*  PLT 255  --   --   --    < > = values in this interval not displayed.   Recent Labs    07/14/20 0534 07/15/20 1422  NA 135 134*  K 3.7 3.3*  CL 99 99  CO2 26 27  GLUCOSE 91 120*  BUN 20 19  CREATININE 4.54* 4.29*  CALCIUM 8.7* 8.0*    Intake/Output Summary (Last 24 hours) at 07/16/2020 0240 Last data filed at 07/15/2020 1750 Gross per 24 hour  Intake 720 ml  Output 1400 ml  Net -680 ml     Pressure Injury 05/01/20 Buttocks Left Stage 4 - Full thickness tissue loss with exposed bone, tendon or muscle. packed with santyl gauze, dressed by WOCN, (Active)  05/01/20 0230  Location: Buttocks  Location Orientation: Left  Staging: Stage 4 - Full thickness tissue loss with exposed bone, tendon or muscle.  Wound Description (Comments): packed with santyl gauze, dressed by WOCN,  Present on Admission: Yes     Pressure Injury 05/01/20 Ankle Left;Lateral Unstageable - Full thickness tissue loss in which the base of the injury is covered by slough (yellow, tan, gray, green or brown) and/or eschar (tan, brown or black) in the wound bed. smal circular wound on the (Active)  05/01/20 0230  Location: Ankle  Location Orientation: Left;Lateral  Staging: Unstageable - Full thickness tissue loss in which the base of the injury is covered by slough (yellow, tan, gray, green or brown) and/or eschar (tan, brown or black) in the wound bed.  Wound Description (Comments): smal circular wound on the bone  Present on Admission: Yes     Pressure Injury 05/01/20 Foot Left;Lateral  Unstageable - Full thickness tissue loss in which the base of the injury is covered by slough (yellow, tan, gray, green or brown) and/or eschar (tan, brown or black) in the wound bed. 4th and 5th toes appear nec (Active)  05/01/20 0230  Location: Foot (4th and 5th toes and lateral/anterior side of the sole)  Location Orientation: Left;Lateral  Staging: Unstageable - Full thickness tissue loss in which the base of the injury is covered by slough (yellow, tan, gray, green or brown) and/or eschar (tan, brown or black) in the wound bed.  Wound Description (Comments): 4th and 5th toes appear necrotic circular wound on sole  Present on Admission: Yes     Pressure Injury 05/01/20 Foot Right;Medial;Posterior Unstageable - Full thickness tissue loss in which the base of the injury is covered by slough (yellow, tan, gray, green or brown) and/or eschar (tan, brown or black) in the wound bed. large circular wo (Active)  05/01/20 0230  Location: Foot  Location Orientation: Right;Medial;Posterior  Staging: Unstageable - Full thickness tissue loss in which the base of the injury is covered by slough (yellow, tan, gray, green or brown) and/or eschar (tan, brown or black) in the wound bed.  Wound Description (Comments): large circular wound on sole  and heel  Present on Admission: Yes  Pressure Injury 05/08/20 Heel Right Unstageable - Full thickness tissue loss in which the base of the injury is covered by slough (yellow, tan, gray, green or brown) and/or eschar (tan, brown or black) in the wound bed. (Active)  05/08/20 1718  Location: Heel  Location Orientation: Right  Staging: Unstageable - Full thickness tissue loss in which the base of the injury is covered by slough (yellow, tan, gray, green or brown) and/or eschar (tan, brown or black) in the wound bed.  Wound Description (Comments):   Present on Admission: Yes    Physical Exam: Vital Signs Blood pressure (!) 169/94, pulse 94, temperature 99.8 F  (37.7 C), temperature source Oral, resp. rate 16, height _0  (1.981 m), weight 110.8 kg, SpO2 100 %. Gen: woke up to talk to me; sleepy, supine on backside, NAD HEENT: oral mucosa pink and moist, NCAT Cardio: regular rhythm and rate Chest: normal breathing; no audible wheezing Abd: soft, nondistended; still slightly TTP/diffuse Extremities: edema in extremities.  No tenderness in extremities. Neuro: sleepy/dull affect Psychiatric: dull/sleepy/flat affect Motor: Bilateral upper extremities: 5/5 proximal distal Bilateral lower extremities: 4-/5 proximal distal   Skin: Multiple foot wounds. L foot- has ruptured blood blister 5th MTP base; also 4th toe is dry gangrene- all black eschar. Large sacral wound- no change Feet wounds look the same- no significant improvement in healing since seen last time  Assessment/Plan: 1. Functional deficits which require 3+ hours per day of interdisciplinary therapy in a comprehensive inpatient rehab setting.  Physiatrist is providing close team supervision and 24 hour management of active medical problems listed below.  Physiatrist and rehab team continue to assess barriers to discharge/monitor patient progress toward functional and medical goals  Care Tool:  Bathing    Body parts bathed by patient: Right arm,Left arm,Chest,Abdomen,Face,Right upper leg,Left upper leg,Right lower leg,Left lower leg   Body parts bathed by helper: Front perineal area,Buttocks     Bathing assist Assist Level: Minimal Assistance - Patient > 75%     Upper Body Dressing/Undressing Upper body dressing   What is the patient wearing?: Pull over shirt    Upper body assist Assist Level: Set up assist    Lower Body Dressing/Undressing Lower body dressing      What is the patient wearing?: Pants     Lower body assist Assist for lower body dressing: Minimal Assistance - Patient > 75%     Toileting Toileting    Toileting assist Assist for toileting: Minimal  Assistance - Patient > 75% (per staff report) Assistive Device Comment:  (urinal)   Transfers Chair/bed transfer  Transfers assist  Chair/bed transfer activity did not occur: Safety/medical concerns (Per nursing report)  Chair/bed transfer assist level: Maximal Assistance - Patient 25 - 49% Chair/bed transfer assistive device: Other (Stedy)   Locomotion Ambulation   Ambulation assist   Ambulation activity did not occur: Safety/medical concerns (unable to achieve standing w/RW w/max assist)  Assist level: Minimal Assistance - Patient > 75% Assistive device: Parallel bars Max distance: 24 ft   Walk 10 feet activity   Assist  Walk 10 feet activity did not occur: Safety/medical concerns (fatigue, lightheadedness, generalized weakness)  Assist level: Minimal Assistance - Patient > 75% Assistive device: Parallel bars   Walk 50 feet activity   Assist Walk 50 feet with 2 turns activity did not occur: Safety/medical concerns (fatigue, lightheadedness, generalized weakness)         Walk 150 feet activity   Assist Walk 150 feet activity did not occur:  Safety/medical concerns (fatigue, lightheadedness, generalized weakness)         Walk 10 feet on uneven surface  activity   Assist Walk 10 feet on uneven surfaces activity did not occur: Safety/medical concerns         Wheelchair     Assist Will patient use wheelchair at discharge?: Yes Type of Wheelchair: Manual Wheelchair activity did not occur: Refused  Wheelchair assist level: Supervision/Verbal cueing Max wheelchair distance: 100    Wheelchair 50 feet with 2 turns activity    Assist        Assist Level: Supervision/Verbal cueing   Wheelchair 150 feet activity     Assist      Assist Level: Minimal Assistance - Patient > 75%   Blood pressure (!) 169/94, pulse 94, temperature 99.8 F (37.7 C), temperature source Oral, resp. rate 16, height _0  (1.981 m), weight 110.8 kg, SpO2 100  %.  Medical Problem List and Plan: 1.  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 Due to participation.  2/22- discussed with pt about risks/benefits of COVID vaccine- strongly recommend esp because could allow him to go  A nursing home closer to home.  2/4- pt won't get COVID vaccine before d/c- he hasn't decided if wants it yet.   2.  Antithrombotics: -DVT/anticoagulation: Continue Subcutaneous heparin.               -antiplatelet therapy: N/A 3. Pain Management: Continue Tylenol as needed, last received 2/20 AM  2/11- denied pain- con't regimen  2/16- having abdominal pain but controlled with tylenol 4. Mood: Provide emotional support             -antipsychotic agents: N/A 5. Neuropsych: This patient is? capable of making decisions on his own behalf. 6. Skin/Wound Care: Routine skin checks 7. Fluids/Electrolytes/Nutrition: Routine in and outs with follow-up chemistries 8.  ESRD.  Hemodialysis per renal services 9.  Sacral decubitus ulcer as well as ischemic ulcers on the left foot fourth toe and fifth metatarsal head and right heel and fifth metatarsal head.  Wound care nurse follow-up.  Patient to see Dr. Sharol Given outpatient.  Continue bilateral PRAFO's. 10.  ID/ coccygeal osteomyelitis.    Completed vancomycin and cefepime on 06/10/2020  1/18- will schedule turning q2 hours to get off wounds- also will make sure on specialty bed  2/7- CRP 6.5- ESR pending - con't regimen  2/8- ESR  DOWN TO 67 FROM 73- SO HEADING IN RIGHT DIRECTION  2/9- Spiked temp last night of 100.9- restarted Vanc/Cefipime- pt refused Vanc "since causes diarrhea". WBC now normal again this AM- will con't for now. CXR (-); is anuric-  2/10- having fevers/sweats/chills- Tm 99.4- needs to also take Vanc- told this to pt- also, called ID and also ordered another CT of pelvis due to Sx's-    2/13 ID saw pt Friday. Thinks  that low grade temps are coming from HD cath. Was not impressed by MRI of pelvis. Recommended stopping abx (d/t loose stool)   -continue to monitor   -continue [facility] per nephrology   -afebrile last 36 hours  2/18- won't stay off backside- is not healing - even with encouragement, not turning off backside   2/24- laying supine every morning- not off sacral wound- doesn't take cues to fix it.  10.  Anemia of chronic disease.  Continue Aranesp.   Hemoglobin 10.0 on 2/3, labs with HD 11.  Diabetes mellitus with peripheral neuropathy .  Hemoglobin A1c 5.6.  Lantus insulin 11 units nightly  CBG (last 3)  Recent Labs    07/15/20 1723 07/15/20 2106 07/16/20 0608  GLUCAP 85 94 86    22/25- BGs all in 80s- con't regimen 12.  Morbid obesity.  BMI 38.37.  Dietary follow-up 13. Dizziness/nausea with HTN and hypotension  Vitals:   07/15/20 2024 07/16/20 0400  BP: 134/80 (!) 169/94  Pulse: (!) 101 94  Resp: 17 16  Temp: 99 F (37.2 C) 99.8 F (37.7 C)  SpO2: 98% 100%    Hydralazine prn   Norvasc to 7.5 mg daily   2/9- BP 156/85 this AM- but has been controlled last few days  2/12-13-bps still with some fluctuation but not severe. Continue current meds, volume mgt per nephrology  2/15- BP's 150s-160s/90s- on Norvasc 7.5- can increase, but usually has orthostatic hypotension when we do. Will d/w Renal  2/16- "passed out" yesterday= will call renal   2/17- renal note shows they will not pull off fluid today in HD due to "passing out event" and diarrhea fluid losses  2/23- BP in 150s/90s  2/24- per chart, no documentation of issues in last 24 hours- but heard that he might have been dizzy again today- will check in AM 14. Diarrhea with new constipation  Bowel meds increased on 2/5  2/7- will decrease bowel meds again since 8+ BMs in last 24 hours- con't regimen  2/12 stool still mushy. Stopped miralax  2/14- will allow them to use the Imodium prn that's ordered- since still having so many  stools- he does this from IV ABX  2/18- has C diff- off Imodium 15. Leukocytosis: Resolved  ESR/CRP and CBC with diff ordered for Monday.  2/7- CRP 6.5- stable- con't regimen   2/8- EST 67- down to 73- con't regimen  2/11- Labs looking slightly better, but imaging does not- waiting to hear from ID  2/14- per ID, pelvis osteomyelitis not the issue- off ABX per their request 16. N/Vomiting-  2/10- likely due to underlying illness- will do ID w/u as above.  17. 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   2/16- abdominal pain slightly better  2/17- BMs less frequent per pt and less abd pain- con't regimen- will recheck labs   2/18- WBC down to 8.7- no Left shift- 1 watery BM today- is doing better- except had Tm 100.5 overnight- if spikes again, might need to call ID back.  2/21- No elevated WBC now- 2-3 loose/liquid BMs in last 24 hours.   2/22- 5+ loose/liquid BMs in last 24 hours- will check with ID to see if should increase Vanc dose, any other ideas- was dx'd 1 week ago.    2/23- increased Vanc to 250 mg PO QID per d/w Dr Juleen China- Pharmacy wanted 500 mg QID and changed him to 500 mg QID  2/24- still having loose stools- just increased Vanc 2/22- so will likely take a few days.  16. Dispo  2/8- pt unhappy that has to back to SNF- however has been participating intermittently- finally doing more/more interactive, but we can only keep max of 30 days. Sent FL2  2/9- pt refused PT yesterday -only bedlevel Exercises- pt needs max encouragement to progress with therapy   2/10- might have to hold d/c- we will see.  2/11- might  not be able to d/c Monday per SNF search/SW   2/15- told SW that cannot d/c right now due to C Diff.  2/21- will call sister tomorrow- cannot since going to clinic today.  2/23- discussed bowel program to start after C Diff and COVID Vaccine    2/24- suggested to  pt once C Diff done, suggested the suppository nightly to clean him out so will have fewer accidents. D/c tomorrow to SNF planned. 17. Depression  2/22- d/w sister- will try Celexa 20 mg daily  To help since doesn't want to do therapy/participate.  2/25- d/c today  18. Lingering cough  2/23- add Flutter valve and Albuterol prn inhaler.   2/22- Called sister about medical issues. Discussed in depth - for an additional 20 minutes. Total 35 minutes on care- >50% coordination of care.      LOS: 40 days A FACE TO FACE EVALUATION WAS PERFORMED  Jarrad Mclees 07/16/2020, 8:33 AM

## 2020-07-16 NOTE — Progress Notes (Addendum)
Patient ID: Fred [name], male   DOB: 01-Sep-1970, 50 y.o.   MRN: [id number] pt set to transport to [name] at 1:30 pm. DC packet at desk and bedside RN has number for report [phone]. COVID negative

## 2020-07-16 NOTE — Progress Notes (Signed)
Attempt to call [name] to give report to pts nurse, she could not come to the phone, [facility] phone number given for nurse to call back for report.   Dayna Ramus, LPN

## 2020-08-16 ENCOUNTER — Encounter
Payer: Medicare Other | Attending: Physical Medicine and Rehabilitation | Admitting: Physical Medicine and Rehabilitation

## 2020-09-21 ENCOUNTER — Other Ambulatory Visit: Payer: Self-pay

## 2020-09-21 DIAGNOSIS — N186 End stage renal disease: Secondary | ICD-10-CM

## 2020-10-04 ENCOUNTER — Ambulatory Visit (INDEPENDENT_AMBULATORY_CARE_PROVIDER_SITE_OTHER)
Admission: RE | Admit: 2020-10-04 | Discharge: 2020-10-04 | Disposition: A | Discharge status: Home / Self Care | Payer: Medicare Other | Source: Ambulatory Visit | Attending: Surgery | Admitting: Surgery

## 2020-10-04 ENCOUNTER — Ambulatory Visit (HOSPITAL_COMMUNITY)
Admission: RE | Admit: 2020-10-04 | Discharge: 2020-10-04 | Disposition: A | Discharge status: Home / Self Care | Payer: Medicare Other | Source: Ambulatory Visit | Attending: Surgery | Admitting: Surgery

## 2020-10-04 ENCOUNTER — Other Ambulatory Visit: Payer: Self-pay

## 2020-10-04 ENCOUNTER — Encounter: Payer: Self-pay | Admitting: Surgery

## 2020-10-04 ENCOUNTER — Ambulatory Visit (INDEPENDENT_AMBULATORY_CARE_PROVIDER_SITE_OTHER): Payer: Medicare Other | Admitting: Surgery

## 2020-10-04 VITALS — BP 111/77 | HR 98 | Temp 97.2°F | Resp 20 | Ht 78.0 in | Wt 245.0 lb

## 2020-10-04 DIAGNOSIS — N186 End stage renal disease: Secondary | ICD-10-CM

## 2020-10-04 DIAGNOSIS — Z992 Dependence on renal dialysis: Secondary | ICD-10-CM

## 2020-10-04 DIAGNOSIS — I7025 Atherosclerosis of native arteries of other extremities with ulceration: Secondary | ICD-10-CM

## 2020-10-04 NOTE — Progress Notes (Signed)
Vascular and Vein Specialist of [facility]  Patient name: Fred [name] MRN: [id number] DOB: June 18, 1970 Sex: male   REQUESTING PROVIDER:    Dr. Carolin Sicks   REASON FOR CONSULT:    HD access  HISTORY OF PRESENT ILLNESS:   Fred [name] is a 50 y.o. male, who is referred for dialysis access.  His renal failure secondary to diabetes and hypertension.  He has a catheter in place.  He is a Tuesday Thursday dialysis patient.  He does not have a pacemaker or defibrillator.  Patient has a history of COVID.  This was complicated by sacral decubitus as well as right heel ulcer.  He has been evaluated by Dr. Lucky Cowboy in the past.  There is concern of her osteomyelitis.  He has been on chronic antibiotics via a left-sided PICC line which is currently not being used.     PAST MEDICAL HISTORY    Past Medical History:  Diagnosis Date  . Chronic kidney disease    HD MWF  . Diabetes mellitus without complication ([place])   . Hypertension      FAMILY HISTORY   History reviewed. No pertinent family history.  SOCIAL HISTORY:   Social History   Socioeconomic History  . Marital status: Single    Spouse name: Not on file  . Number of children: Not on file  . Years of education: Not on file  . Highest education level: Not on file  Occupational History  . Not on file  Tobacco Use  . Smoking status: Never Smoker  . Smokeless tobacco: Never Used  Vaping Use  . Vaping Use: Never used  Substance and Sexual Activity  . Alcohol use: Never  . Drug use: Never  . Sexual activity: Not on file  Other Topics Concern  . Not on file  Social History Narrative  . Not on file   Social Determinants of Health   Financial Resource Strain: Not on file  Food Insecurity: Not on file  Transportation Needs: Not on file  Physical Activity: Not on file  Stress: Not on file  Social Connections: Not on file  Intimate Partner Violence: Not on file    ALLERGIES:     Allergies  Allergen Reactions  . Atorvastatin Hives    NOT on MAR  . Trazodone Hcl     Other reaction(s): Confusion (intolerance)    CURRENT MEDICATIONS:    Current Outpatient Medications  Medication Sig Dispense Refill  . acetaminophen (TYLENOL) 325 MG tablet Take 650 mg by mouth every 6 (six) hours as needed for mild pain or fever (>101 F).    Marland Kitchen albuterol (VENTOLIN HFA) 108 (90 Base) MCG/ACT inhaler Inhale 1-2 puffs into the lungs every 4 (four) hours as needed for wheezing or shortness of breath.    Marland Kitchen amLODipine (NORVASC) 5 MG tablet Take 1 tablet (5 mg total) by mouth at bedtime.    . citalopram (CELEXA) 20 MG tablet Take 1 tablet (20 mg total) by mouth daily.    . collagenase (SANTYL) ointment Apply topically daily. 15 g 0  . insulin glargine (LANTUS) 100 UNIT/ML injection Inject 0.11 mLs (11 Units total) into the skin at bedtime. 10 mL 11  . insulin lispro (HUMALOG) 100 UNIT/ML KwikPen Inject into the skin.    Marland Kitchen lanthanum (FOSRENOL) 500 MG chewable tablet Chew 1 tablet (500 mg total) by mouth 3 (three) times daily with meals.    Marland Kitchen LEVEMIR FLEXTOUCH 100 UNIT/ML FlexPen Inject into the skin.    . multivitamin (RENA-VIT)  TABS tablet Take 1 tablet by mouth daily.    . ondansetron (ZOFRAN) 4 MG tablet Take 1 tablet (4 mg total) by mouth every 6 (six) hours as needed for nausea. 20 tablet 0  . pantoprazole (PROTONIX) 40 MG tablet Take 1 tablet (40 mg total) by mouth daily at 6 (six) AM.     No current facility-administered medications for this visit.    REVIEW OF SYSTEMS:   '[X]'$  denotes positive finding, '[ ]'$  denotes negative finding Cardiac  Comments:  Chest pain or chest pressure:    Shortness of breath upon exertion:    Short of breath when lying flat:    Irregular heart rhythm:        Vascular    Pain in calf, thigh, or hip brought on by ambulation:    Pain in feet at night that wakes you up from your sleep:     Blood clot in your veins:    Leg swelling:          Pulmonary    Oxygen at home:    Productive cough:     Wheezing:         Neurologic    Sudden weakness in arms or legs:     Sudden numbness in arms or legs:     Sudden onset of difficulty speaking or slurred speech:    Temporary loss of vision in one eye:     Problems with dizziness:         Gastrointestinal    Blood in stool:      Vomited blood:         Genitourinary    Burning when urinating:     Blood in urine:        Psychiatric    Major depression:         Hematologic    Bleeding problems:    Problems with blood clotting too easily:        Skin    Rashes or ulcers: x       Constitutional    Fever or chills:     PHYSICAL EXAM:   Vitals:   10/04/20 1143  BP: 111/77  Pulse: 98  Resp: 20  Temp: (!) 97.2 F (36.2 C)  SpO2: 96%  Weight: 245 lb (111.1 kg)  Height: '6\' 6"'$  (1.981 m)    GENERAL: The patient is a well-nourished male, in no acute distress. The vital signs are documented above. CARDIAC: There is a regular rate and rhythm.  VASCULAR: Nonpalpable pedal pulses with significant edema to the right leg PULMONARY: Nonlabored respirations ABDOMEN: Soft and non-tender with normal pitched bowel sounds.  MUSCULOSKELETAL: There are no major deformities or cyanosis. NEUROLOGIC: No focal weakness or paresthesias are detected. SKIN: See photos below. PSYCHIATRIC: The patient has a normal affect.      STUDIES:   I have reviewed the following: +-----------------+-------------+----------+---------+  Right Cephalic  Diameter (cm)Depth (cm)Findings   +-----------------+-------------+----------+---------+  Shoulder       0.41     1.06         +-----------------+-------------+----------+---------+  Prox upper arm    0.49     0.76         +-----------------+-------------+----------+---------+  Mid upper arm    0.50     0.43  branching  +-----------------+-------------+----------+---------+  Dist upper  arm    0.39     0.58         +-----------------+-------------+----------+---------+  Antecubital fossa  0.43     0.62  branching  +-----------------+-------------+----------+---------+  Prox forearm     0.40     0.55         +-----------------+-------------+----------+---------+  Mid forearm     0.36     0.37         +-----------------+-------------+----------+---------+  Dist forearm     0.32     0.23  branching  +-----------------+-------------+----------+---------+  Wrist        0.26     0.27         +-----------------+-------------+----------+---------+   +-----------------+-------------+----------+---------+  Right Basilic  Diameter (cm)Depth (cm)Findings   +-----------------+-------------+----------+---------+  Prox upper arm    0.42     1.22         +-----------------+-------------+----------+---------+  Mid upper arm    0.49     1.21  branching  +-----------------+-------------+----------+---------+  Dist upper arm    0.32     0.76         +-----------------+-------------+----------+---------+  Antecubital fossa  0.25     0.60         +-----------------+-------------+----------+---------+  Prox forearm     0.25     0.20         +-----------------+-------------+----------+---------+  Mid forearm     0.24     0.25         +-----------------+-------------+----------+---------+  Distal forearm    0.17     0.24         +-----------------+-------------+----------+---------+  Wrist        0.15     0.24         +-----------------+-------------+----------+---------+   +-----------------+-------------+----------+---------+  Left Cephalic  Diameter (cm)Depth (cm)Findings   +-----------------+-------------+----------+---------+  Shoulder        0.35     1.05         +-----------------+-------------+----------+---------+  Prox upper arm    0.46     0.76         +-----------------+-------------+----------+---------+  Mid upper arm    0.36     0.58  branching  +-----------------+-------------+----------+---------+  Dist upper arm    0.39     0.48  branching  +-----------------+-------------+----------+---------+  Antecubital fossa  0.44     0.27         +-----------------+-------------+----------+---------+  Prox forearm     0.31     0.63         +-----------------+-------------+----------+---------+  Mid forearm     0.30     0.44  branching  +-----------------+-------------+----------+---------+  Dist forearm     0.19     0.30         +-----------------+-------------+----------+---------+  Wrist        0.14     0.39         +-----------------+-------------+----------+---------+   +-----------------+-------------+----------+---------+  Left Basilic   Diameter (cm)Depth (cm)Findings   +-----------------+-------------+----------+---------+  Prox upper arm    0.56     0.96         +-----------------+-------------+----------+---------+  Mid upper arm    0.45     0.91         +-----------------+-------------+----------+---------+  Dist upper arm    0.51     0.93  branching  +-----------------+-------------+----------+---------+  Antecubital fossa  0.37     0.39         +-----------------+-------------+----------+---------+  Prox forearm     0.32     0.36  branching  +-----------------+-------------+----------+---------+  Mid forearm     0.32     0.36         +-----------------+-------------+----------+---------+  Distal forearm    0.24     0.27          +-----------------+-------------+----------+---------+  Wrist        0.18     0.27         +-----------------+-------------+----------+---------+   +--------+------------------+-----+--------+-------+  Left  Lt Pressure (mmHg)IndexWaveformComment  +--------+------------------+-----+--------+-------+  [id number]                     +--------+------------------+-----+--------+-------+  PTA   0         0.00 absent       +--------+------------------+-----+--------+-------+  DP   183        0.80 biphasic      +--------+------------------+-----+--------+-------+   ASSESSMENT and PLAN   End-stage renal disease: The patient is a good candidate for a left brachiocephalic fistula.  Currently, he has a right sided TDC and a PICC line on the left which is not being utilized.  He will need to have his PICC line removed before proceeding with brachiocephalic fistula.  It does not appear that the PICC line is in his cephalic vein.  Bilateral lower extremity ulcers, right greater than left: The patient appears to have adequate blood flow for wound healing.  He has seen Dr. Sharol Given in the past.  Based on the drainage and appearance of his heel ulcer, I suspect there is underlying osteomyelitis and that he is going to require a below-knee amputation.  I am also suspicious that he will need amputation of his left fourth and fifth toe including the ulcer on the lateral aspect of the left foot.  I am sending the patient back to see Dr. Sharol Given for second opinion.   Leia Alf, MD, FACS [facility] Specialists of [facility] [phone] Pager [phone]

## 2020-10-06 ENCOUNTER — Ambulatory Visit: Payer: Self-pay

## 2020-10-06 ENCOUNTER — Ambulatory Visit (INDEPENDENT_AMBULATORY_CARE_PROVIDER_SITE_OTHER): Payer: Medicare Other

## 2020-10-06 ENCOUNTER — Ambulatory Visit (INDEPENDENT_AMBULATORY_CARE_PROVIDER_SITE_OTHER): Payer: Medicare Other | Admitting: Physician Assistant

## 2020-10-06 ENCOUNTER — Encounter: Payer: Self-pay | Admitting: Physician Assistant

## 2020-10-06 DIAGNOSIS — L97519 Non-pressure chronic ulcer of other part of right foot with unspecified severity: Secondary | ICD-10-CM

## 2020-10-06 DIAGNOSIS — L97529 Non-pressure chronic ulcer of other part of left foot with unspecified severity: Secondary | ICD-10-CM | POA: Diagnosis not present

## 2020-10-06 NOTE — Progress Notes (Addendum)
Office Visit Note   Patient: Fred [name]           Date of Birth: [birth year]           MRN: [id number] Visit Date: 10/06/2020              Requested by: Serafina Mitchell, MD [address] [place],  [place] [address] PCP: Pcp, No  Chief Complaint  Patient presents with  . Right Foot - Open Wound  . Left Ankle - Open Wound  . Left Foot - Open Wound      HPI: Patient is a pleasant 50 year old gentleman with a history of diabetes and end-stage renal disease.  He was evaluated by Dr. Sharol Given 5 months ago when he was in the hospital for a calcaneal ulcer as well as ulcers on his left foot.  At that time these were stable and the patient was not interested in surgery.  Patient is currently at a nursing facility.  He was referred from vascular surgery who felt he needed a below-knee amputation on the right.  Patient is in dialysis on Tuesday Thursdays and Saturdays  Assessment & Plan: Visit Diagnoses:  1. Skin ulcers of foot, bilateral ([place])     Plan: I had a long discussion with the patient today.  He understands he does not have any limb salvage options on the right side and would plan to move forward with a right below-knee amputation.  On the left I would ideally need fourth and fifth ray amputations however given his circulatory status this may not be an option.  The progression of the osteomyelitis on this side has been slower than the right and is currently fairly stable.  Patient would like for [place] to speak with his Sister Fred [name] who is at work right now.  We will give her information to contact us.  If agreeable with plan for below-knee amputation on June 1 when Dr. Sharol Given returns.  If for some reason patient became more unstable he understands he would have to go through the emergency department and have this performed by an available surgeon on-call he is currently on IV antibiotics. He understands that the infection can be life threatening. I explained the risks and recovery from the  surgery  Follow-Up Instructions: No follow-ups on file.   Ortho Exam  Patient is alert, oriented, no adenopathy, well-dressed, normal affect, normal respiratory effort. Bilaterally does not have palpable pulses.  On the right side he has erythema and swelling but no ascending cellulitis.  He has necrotic ulcer and wound and exposed necrotic calcaneus with serous drainage.  On the left side he has necrotic changes of the fourth toe and a lateral ulcer of the fifth toe.  Mild erythema but no ascending cellulitis.  Mild soft tissue swelling.  On the ankle on the lateral side he also has an ulcer with fibrinous tissue at the base does not probe to bone today.  Imaging: XR Ankle 2 Views Left  Result Date: 10/06/2020 2 views of the left ankle demonstrate no acute changes degenerative changes  XR Foot 2 Views Left  Result Date: 10/06/2020 2 views of his left foot demonstrate degenerative changes with previous trauma.  He also has destructive changes in the distal fifth metatarsal and fourth distal metatarsal consistent with osteomyelitis.  This has progressed from previous x-rays in December  XR Foot 2 Views Right  Result Date: 10/06/2020 2 views of the right foot demonstrate significant destructive changes in the right calcaneus.  Also calcification within the vessels.  Significantly progressed from previous x-rays in December  No images are attached to the encounter.  Labs: Lab Results  Component Value Date   HGBA1C 5.6 05/04/2020   ESRSEDRATE 50 (H) 07/12/2020   ESRSEDRATE 75 (H) 07/05/2020   ESRSEDRATE 67 (H) 06/28/2020   CRP 10.3 (H) 07/12/2020   CRP 7.2 (H) 07/05/2020   CRP 6.5 (H) 06/28/2020   REPTSTATUS 07/02/2020 FINAL 06/30/2020   GRAMSTAIN  06/30/2020    NO WBC SEEN NO ORGANISMS SEEN Performed at [facility], [place] [address] [place], [place] [address]    CULT MULTIPLE ORGANISMS PRESENT, NONE PREDOMINANT (A) 06/30/2020     Lab Results  Component Value Date    ALBUMIN 2.0 (L) 07/15/2020   ALBUMIN 1.9 (L) 07/14/2020   ALBUMIN 1.9 (L) 07/12/2020    Lab Results  Component Value Date   MG 1.6 (L) 05/04/2020   MG 1.8 05/03/2020   MG 1.9 05/02/2020   No results found for: VD25OH  No results found for: PREALBUMIN CBC EXTENDED Latest Ref Rng & Units 07/16/2020 07/16/2020 07/15/2020  WBC 4.0 - 10.5 K/uL - - -  RBC 4.22 - 5.81 MIL/uL - - -  HGB 13.0 - 17.0 g/dL 11.0(L) 10.2(L) 10.5(L)  HCT 39.0 - 52.0 % 35.1(L) 34.0(L) 33.4(L)  PLT 150 - 400 K/uL - - -  NEUTROABS 1.7 - 7.7 K/uL - - -  LYMPHSABS 0.7 - 4.0 K/uL - - -     There is no height or weight on file to calculate BMI.  Orders:  Orders Placed This Encounter  Procedures  . XR Foot 2 Views Right  . XR Foot 2 Views Left  . XR Ankle 2 Views Left   No orders of the defined types were placed in this encounter.    Procedures: No procedures performed  Clinical Data: No additional findings.  ROS:  All other systems negative, except as noted in the HPI. Review of Systems  Objective: Vital Signs: There were no vitals taken for this visit.  Specialty Comments:  No specialty comments available.  PMFS History: Patient Active Problem List   Diagnosis Date Noted  . Diarrhea   . History of Clostridioides difficile colitis   . Fever   . Anemia of chronic disease   . ESRD on dialysis ([place])   . Diabetic peripheral neuropathy ([place])   . Labile blood glucose   . Essential hypertension   . Constipation   . 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 Avascular necrosis of first metatarsal, left foot ([place]) 04/30/2020  . Osteomyelitis of dorsal first metatarsal, left foot ([place]) 04/30/2020  . Pressure injury of sacral region, unstageable ([place]) 04/30/2020  . Morbid obesity ([place]) 04/30/2020  . Multiple open wounds of foot   . Insulin dependent type 2 diabetes mellitus ([place]) 04/29/2020  . HTN (hypertension) 04/29/2020  . History of anemia due to chronic kidney disease 04/29/2020  . Sacral  osteomyelitis ([place]) 04/29/2020  . Pressure injury of both heels, unstageable ([place]) 04/29/2020  . Pneumonia due to COVID-19 virus 04/29/2020  . ESRD (end stage renal disease) ([place]) 04/29/2020   Past Medical History:  Diagnosis Date  . Chronic kidney disease    HD MWF  . Diabetes mellitus without complication ([place])   . Hypertension     No family history on file.  Past Surgical History:  Procedure Laterality Date  . CHOLECYSTECTOMY     Social History   Occupational History  . Not on  file  Tobacco Use  . Smoking status: Never Smoker  . Smokeless tobacco: Never Used  Vaping Use  . Vaping Use: Never used  Substance and Sexual Activity  . Alcohol use: Never  . Drug use: Never  . Sexual activity: Not on file

## 2020-10-13 ENCOUNTER — Telehealth: Payer: Self-pay | Admitting: Physician Assistant

## 2020-10-13 ENCOUNTER — Other Ambulatory Visit: Payer: Self-pay | Admitting: Physician Assistant

## 2020-10-13 NOTE — Telephone Encounter (Signed)
Tried to reach patient to talk to him about his sister who he wanted to call office to sicuss his surgery.

## 2020-10-13 NOTE — Progress Notes (Signed)
Called to speak to patient today at nursing home. Have not heard from his sister with whom he wanted [place] to discuss surgery but he wanted her to call us as she works. Patient does not have a phone in his room. Facility will have nurse at his unit call

## 2020-10-13 NOTE — Telephone Encounter (Signed)
Dawn Investment banker, corporate) returned call to PA Persons requesting a call back. Please call Dawn at [phone] concerning this patient.

## 2020-10-13 NOTE — Telephone Encounter (Signed)
I had the opportunity to speak with [place] who is a nurse that is been working with the patient.  She also was able to let me talk to the patient directly.  Patient is still unsure about amputation.  He would like Dr. Alfonso Ramus opinion who is been seeing him once a week from wound care.  I did have the opportunity to speak with Dr. Renard Hamper who is a wound specialist.  He has documented and told the patient over and over again that the wound care he is doing is just to hopefully prevent him from becoming septic.  He has been giving him IV antibiotics and any antibiotic that it seems sensitive to to prevent the wound from causing sepsis.  He has spoken to the patient and told him he needs an amputation.  He will speak with the patient again tomorrow.  I was unable to really reach the patient today.  Dr. Renard Hamper also said to call him if any concerns [phone] Dr Renard Hamper   [phone] DAWN

## 2020-10-14 ENCOUNTER — Telehealth: Payer: Self-pay

## 2020-10-14 NOTE — Telephone Encounter (Signed)
Pt to have L arm fistula placed once PICC line is out per Dr. Trula Slade. [facility] to follow up on status of PICC line. S/w nurse- Harrie Foreman who advised pt is still receiving Abx via PICC line but unsure of dc date or plans for removal. States she will discuss with nurse practitioner and contact office back with further information.

## 2020-10-19 ENCOUNTER — Telehealth: Payer: Self-pay | Admitting: Physician Assistant

## 2020-10-19 NOTE — Telephone Encounter (Signed)
-----   Message from Pamella Pert, [place] sent at 10/19/2020  1:02 PM EDT ----- Regarding: appt Did I send you a message on this pt to make a return appt?

## 2020-10-19 NOTE — Telephone Encounter (Signed)
Great I called the facility earlier and told them he needed to come in

## 2020-10-19 NOTE — Telephone Encounter (Signed)
Called and got pt scheduled for 6/6/ @ 9:30

## 2020-10-19 NOTE — Telephone Encounter (Signed)
Just FYI we talked about this pt this morning.

## 2020-10-25 ENCOUNTER — Other Ambulatory Visit: Payer: Self-pay | Admitting: Physician Assistant

## 2020-10-25 ENCOUNTER — Encounter: Payer: Self-pay | Admitting: Orthopedic Surgery

## 2020-10-25 ENCOUNTER — Ambulatory Visit (INDEPENDENT_AMBULATORY_CARE_PROVIDER_SITE_OTHER): Payer: Medicare Other | Admitting: Orthopedic Surgery

## 2020-10-25 ENCOUNTER — Other Ambulatory Visit: Payer: Self-pay

## 2020-10-25 DIAGNOSIS — I7025 Atherosclerosis of native arteries of other extremities with ulceration: Secondary | ICD-10-CM | POA: Diagnosis not present

## 2020-10-25 DIAGNOSIS — M86271 Subacute osteomyelitis, right ankle and foot: Secondary | ICD-10-CM | POA: Diagnosis not present

## 2020-10-25 DIAGNOSIS — L02611 Cutaneous abscess of right foot: Secondary | ICD-10-CM

## 2020-10-25 NOTE — Progress Notes (Signed)
Office Visit Note   Patient: Fred [name]           Date of Birth: 1970-06-12           MRN: [id number] Visit Date: 10/25/2020              Requested by: No referring provider defined for this encounter. PCP: Pcp, No  Chief Complaint  Patient presents with  . Right Foot - Pain  . Left Foot - Pain      HPI: Patient is a 50 year old gentleman resident of [facility] skilled nursing who is seen for initial evaluation for ulceration abscess and osteomyelitis right hindfoot.  Patient has a history of diabetes hypertension end-stage renal disease with dialysis Tuesday Thursday Saturday.  Patient states that he is then scheduled for a fistula for dialysis that has been underway since November.  Patient is currently undergoing silver alginate dressing changes he complains of swelling and drainage and odor.  Patient also states he does have some superficial ulcers to the left foot fourth and fifth toes.  Patient states that he has previously been hospitalized for sepsis secondary to cholecystitis he denies any history of stroke.  Assessment & Plan: Visit Diagnoses:  1. Cutaneous abscess of right foot   2. Subacute osteomyelitis, right ankle and foot ([place])     Plan: Patient will be set up for a transtibial amputation this Friday on the right.  Anticipate return back to [facility].  Follow-Up Instructions: Return in about 2 weeks (around 11/08/2020).   Ortho Exam  Patient is alert, oriented, no adenopathy, well-dressed, normal affect, normal respiratory effort. Examination patient has significant swelling the right lower extremity pulses not palpable.  The Doppler was used and he has a biphasic dorsalis pedis pulse with a triphasic anterior tibial pulse on the right no arterial insufficiency.  Patient does have a massive necrotic ulcer over the right heel with cellulitis purulent drainage odor and an ulcer that probes to bone.  Patient's has a albumin of 2.0.  No current  hemoglobin A1c on record.  Imaging: No results found. No images are attached to the encounter.  Labs: Lab Results  Component Value Date   HGBA1C 5.6 05/04/2020   ESRSEDRATE 50 (H) 07/12/2020   ESRSEDRATE 75 (H) 07/05/2020   ESRSEDRATE 67 (H) 06/28/2020   CRP 10.3 (H) 07/12/2020   CRP 7.2 (H) 07/05/2020   CRP 6.5 (H) 06/28/2020   REPTSTATUS 07/02/2020 FINAL 06/30/2020   GRAMSTAIN  06/30/2020    NO WBC SEEN NO ORGANISMS SEEN Performed at [facility], [place] [address] [place], [place] [address]    CULT MULTIPLE ORGANISMS PRESENT, NONE PREDOMINANT (A) 06/30/2020     Lab Results  Component Value Date   ALBUMIN 2.0 (L) 07/15/2020   ALBUMIN 1.9 (L) 07/14/2020   ALBUMIN 1.9 (L) 07/12/2020    Lab Results  Component Value Date   MG 1.6 (L) 05/04/2020   MG 1.8 05/03/2020   MG 1.9 05/02/2020   No results found for: VD25OH  No results found for: PREALBUMIN CBC EXTENDED Latest Ref Rng & Units 07/16/2020 07/16/2020 07/15/2020  WBC 4.0 - 10.5 K/uL - - -  RBC 4.22 - 5.81 MIL/uL - - -  HGB 13.0 - 17.0 g/dL 11.0(L) 10.2(L) 10.5(L)  HCT 39.0 - 52.0 % 35.1(L) 34.0(L) 33.4(L)  PLT 150 - 400 K/uL - - -  NEUTROABS 1.7 - 7.7 K/uL - - -  LYMPHSABS 0.7 - 4.0 K/uL - - -  There is no height or weight on file to calculate BMI.  Orders:  No orders of the defined types were placed in this encounter.  No orders of the defined types were placed in this encounter.    Procedures: No procedures performed  Clinical Data: No additional findings.  ROS:  All other systems negative, except as noted in the HPI. Review of Systems  Objective: Vital Signs: There were no vitals taken for this visit.  Specialty Comments:  No specialty comments available.  PMFS History: Patient Active Problem List   Diagnosis Date Noted  . Diarrhea   . History of Clostridioides difficile colitis   . Fever   . Anemia of chronic disease   . ESRD on dialysis ([place])   . Diabetic peripheral  neuropathy ([place])   . Labile blood glucose   . Essential hypertension   . Constipation   . 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 Avascular necrosis of first metatarsal, left foot ([place]) 04/30/2020  . Osteomyelitis of dorsal first metatarsal, left foot ([place]) 04/30/2020  . Pressure injury of sacral region, unstageable ([place]) 04/30/2020  . Morbid obesity ([place]) 04/30/2020  . Multiple open wounds of foot   . Insulin dependent type 2 diabetes mellitus ([place]) 04/29/2020  . HTN (hypertension) 04/29/2020  . History of anemia due to chronic kidney disease 04/29/2020  . Sacral osteomyelitis ([place]) 04/29/2020  . Pressure injury of both heels, unstageable ([place]) 04/29/2020  . Pneumonia due to COVID-19 virus 04/29/2020  . ESRD (end stage renal disease) ([place]) 04/29/2020   Past Medical History:  Diagnosis Date  . Chronic kidney disease    HD MWF  . Diabetes mellitus without complication ([place])   . Hypertension     History reviewed. No pertinent family history.  Past Surgical History:  Procedure Laterality Date  . CHOLECYSTECTOMY     Social History   Occupational History  . Not on file  Tobacco Use  . Smoking status: Never Smoker  . Smokeless tobacco: Never Used  Vaping Use  . Vaping Use: Never used  Substance and Sexual Activity  . Alcohol use: Never  . Drug use: Never  . Sexual activity: Not on file

## 2020-10-28 ENCOUNTER — Encounter (HOSPITAL_COMMUNITY): Payer: Self-pay | Admitting: Orthopedic Surgery

## 2020-10-28 NOTE — Anesthesia Preprocedure Evaluation (Addendum)
Anesthesia Evaluation  Patient identified by MRN, date of birth, ID band Patient awake    Reviewed: Allergy & Precautions, NPO status , Patient's Chart, lab work & pertinent test results  Airway Mallampati: II  TM Distance: >3 FB Neck ROM: Full    Dental no notable dental hx.    Pulmonary shortness of breath,    Pulmonary exam normal breath sounds clear to auscultation       Cardiovascular hypertension, Pt. on medications negative cardio ROS Normal cardiovascular exam Rhythm:Regular Rate:Normal     Neuro/Psych  Neuromuscular disease negative psych ROS   GI/Hepatic Neg liver ROS, GERD  ,  Endo/Other  negative endocrine ROSdiabetes, Type 2  Renal/GU Dialysis and ESRFRenal disease  negative genitourinary   Musculoskeletal negative musculoskeletal ROS (+)   Abdominal   Peds negative pediatric ROS (+)  Hematology negative hematology ROS (+) anemia ,   Anesthesia Other Findings   Reproductive/Obstetrics negative OB ROS                            Anesthesia Physical Anesthesia Plan  ASA: 4  Anesthesia Plan: General   Post-op Pain Management:    Induction: Intravenous  PONV Risk Score and Plan: 2 and Ondansetron, Treatment may vary due to age or medical condition and Midazolam  Airway Management Planned: LMA  Additional Equipment:   Intra-op Plan:   Post-operative Plan: Extubation in OR  Informed Consent: I have reviewed the patients History and Physical, chart, labs and discussed the procedure including the risks, benefits and alternatives for the proposed anesthesia with the patient or authorized representative who has indicated his/her understanding and acceptance.     Dental advisory given  Plan Discussed with: CRNA  Anesthesia Plan Comments: (PAT note written 10/28/2020 by [name], PA-C. )      Anesthesia Quick Evaluation

## 2020-10-28 NOTE — Progress Notes (Addendum)
Patient resides at [facility] phone [phone].  Spoke with patient's nurse, Caryl Pina, LPN to obtain PAT information for DOS.  Instructions verbally reviewed via phone and also faxed to [phone].  DUE TO COVID-19 ONLY ONE VISITOR IS ALLOWED TO COME WITH YOU AND STAY IN THE WAITING ROOM ONLY DURING PRE OP AND PROCEDURE DAY OF SURGERY.   PCP - Dr Wenda Low Cardiologist - n/a  Chest x-ray - 07/08/20 (2V) EKG - 04/29/20 Stress Test - n/a ECHO - 02/13/20 [facility] Cardiac Cath - n/a  DM2 Fasting Blood Sugar - 160s-170s Checks Blood Sugar 4 times a day  ERAS: Clear liquids til 5:45 am DOS.  Anesthesia review: yes  Coronavirus Screening Covid test on DOS 10/29/20 Per Nurse, Does the patient have any of the following symptoms:  Cough yes/no: No Fever (>100.9F)  yes/no: No Runny nose yes/no: No Sore throat yes/no: No Difficulty breathing/shortness of breath  yes/no: No  Have you traveled in the last 14 days and where? yes/no: No  Nurse Caryl Pina verbalized understanding of instructions that were given via phone.  Instructions were faxed to [phone].

## 2020-10-28 NOTE — Progress Notes (Addendum)
Surgical Instructions For [facility] Surgery Date: 10/29/20 at 8:51 am - 9:42 am.    Your procedure is scheduled on Friday, 10/29/20.  Report to [facility] Main Entrance "A" at 6:20 A.M., then check in with the Admitting office.  Call this number if you have problems the morning of surgery:  [phone]   If you have any questions prior to your surgery date call [phone]: Open Monday-Friday 8am-4pm    Remember:  Do not eat after midnight tonight.  You may drink clear liquids until 5:45 A.M. the morning of your surgery.   Clear liquids allowed are: Water, Non-Citrus Juices (without pulp), Carbonated Beverages, Clear Tea, Black Coffee Only, and Gatorade    Take these medicines the morning of surgery with A SIP OF WATER: Celexa Protonix Tylenol PRN Zofran PRN Albuterol Inhaler PRN - Bring Inhaler   Diabetes Medication THE NIGHT BEFORE SURGERY, take 5 units of Lantus Insulin.        If your blood sugar is less than 70 mg/dL, you will need to treat for low blood sugar: Treat a low blood sugar (less than 70 mg/dL) with  cup of clear juice (cranberry or apple), 4 glucose tablets, OR glucose gel. Recheck blood sugar in 15 minutes after treatment (to make sure it is greater than 70 mg/dL). If your blood sugar is not greater than 70 mg/dL on recheck, call [phone] for further instructions.  As of today, STOP taking any Aspirin (unless otherwise instructed by your surgeon) Aleve, Naproxen, Ibuprofen, Motrin, Advil, Goody's, BC's, all herbal medications, fish oil, and all vitamins.          Do not wear  Do not wear lotions, powders, colognes, or deodorant. Men may shave face and neck. Do not bring valuables to the hospital.              [facility] is not responsible for any belongings or valuables.  Do NOT Smoke (Tobacco/Vaping) or drink Alcohol 24 hours prior to your procedure If you use a CPAP at night, you may bring all equipment for your overnight stay.   Contacts,  glasses, dentures or bridgework may not be worn into surgery, please bring cases for these belongings   For patients admitted to the hospital, discharge time will be determined by your treatment team.    ONLY 1 SUPPORT PERSON MAY BE PRESENT WHILE YOU ARE IN SURGERY. IF YOU ARE TO BE ADMITTED ONCE YOU ARE IN YOUR ROOM YOU WILL BE ALLOWED TWO (2) VISITORS.    Special instructions:    Oral Hygiene is also important to reduce your risk of infection.  Remember - BRUSH YOUR TEETH THE MORNING OF SURGERY WITH YOUR REGULAR TOOTHPASTE

## 2020-10-28 NOTE — Progress Notes (Signed)
Case: [id number] Date/Time: 10/29/20 0836   Procedure: RIGHT BELOW KNEE AMPUTATION (Right: Knee)   Anesthesia type: Choice   Pre-op diagnosis: Abscess, Osteomyelitis Right Foot   Location: [facility] OR ROOM 05 / [place] OR   Surgeons: Newt Minion, MD       DISCUSSION: Patient is a 50 year old male scheduled for the above procedure.  History includes never smoker, DM2, HTN, ESRD, anemia, GERD, dyspnea, cholecystectomy 03/23/20, obesity, C-difficile (03/25/20), + COVID-19 04/20/21.   Admission 04/29/20-06/06/20 with dyspnea and hypoxia (O2 sat 80's requiring short term 3L O2), recent + COVID-19 test, bilateral foot wounds and a stage IV sacral ulcer.  Covid was treated with remdesivir and Decadron. O2 weaned. General surgery and orthopedics were consulted for sacral wound his extremity osteomyelitis/gangrene. No indication for surgery. Sacral wound was treated with hydrotherapy and chemical debridement. Wound care for wounds on feet and offloading with air mattresses PRAFO boots. Nephrology managed HD. PT/OT worked with him for deconditioning. He was discharged to [facility] 06/06/20-07/16/20 for ongoing therapy for critical illness myopathy and wound care. Discharged back to [facility].   Per 10/04/20 vascular surgery evaluation by Brabham, V. Wells, MD, patient has HD on TTS via right sided catheter. As above, he has a history of sacral decubitus and right heel ulcer with concern for osteomyelitis. He had been on long term antibiotics via a PICC line which Dr. Trula Slade recommended it be removed prior to undergoing left brachiocephalic AVF. In regards to his heel ulcer, there was concern for osteomyelitis and thought he may require right BKA and possible toe amputation on the left for ulcerations. He referred patient to Dr. Sharol Given.  Anesthesia team to evaluate on the day of surgery.   VS:  BP Readings from Last 3 Encounters:  10/04/20 111/77  07/16/20 (!) 166/104  06/06/20 (!) 182/66   Pulse Readings  from Last 3 Encounters:  10/04/20 98  07/16/20 91  06/06/20 96     PROVIDERS: Pcp, No   LABS: For day of surgery. As of 07/16/20, H/H 11.0/35.1. A1c 5.6% 05/04/20.   IMAGES: 1V PCXR 07/08/20: FINDINGS: Right IJ approach dialysis catheter is unchanged. Lungs are clear. Heart size is normal. No pneumothorax or pleural fluid. No acute or focal bony abnormality. IMPRESSION: No acute disease.   EKG: 04/29/20: ST at 103 bpm   CV: Echo 02/13/20 ([facility]): SUMMARY  The left ventricular size is normal.  Moderate left ventricular hypertrophy  There is moderate concentric left ventricular hypertrophy.  Upper septal hypertrophy (sigmoid septum), normal variant.  LV ejection fraction = 60-65%.  Overall left ventricular function appears to be normal .  Left ventricular filling pattern is pseudonormal.  No segmental wall motion abnormalities seen in the left ventricle  The right ventricle is normal in size and function.  The left atrium is mildly dilated.  The aortic valve is trileaflet.  There is mild mitral regurgitation.  The regurgitant jet is posteriorly-directed.  There is mild mitral annular calcification.  There is trace tricuspid regurgitation.  Borderline dilated inferior vena cava.  There is a pleural effusion present.  There is no pericardial effusion.  There is no comparison study available.  Clinical correlation is recommended.     Past Medical History:  Diagnosis Date   Anemia    takes iron pills   Chronic kidney disease    HD MWF   Diabetes mellitus without complication (HCC)    Dyspnea    SOB - uses inhaler   GERD (gastroesophageal reflux disease)  Hypertension     Past Surgical History:  Procedure Laterality Date   CHOLECYSTECTOMY      MEDICATIONS: No current facility-administered medications for this encounter.    acetaminophen (TYLENOL) 325 MG tablet   albuterol (VENTOLIN HFA) 108 (90 Base) MCG/ACT inhaler   Amino Acids-Protein  Hydrolys (PRO-STAT SUGAR FREE PO)   amLODipine (NORVASC) 5 MG tablet   cadexomer iodine (IODOSORB) 0.9 % gel   Cholecalciferol (VITAMIN D3) 50 MCG (2000 UT) TABS   citalopram (CELEXA) 20 MG tablet   ferrous sulfate 325 (65 FE) MG tablet   insulin glargine (LANTUS) 100 UNIT/ML injection   lanthanum (FOSRENOL) 500 MG chewable tablet   melatonin 3 MG TABS tablet   metroNIDAZOLE (FLAGYL) 500 MG tablet   midodrine (PROAMATINE) 10 MG tablet   multivitamin (RENA-VIT) TABS tablet   Nutritional Supplements (NEPRO/CARBSTEADY PO)   ondansetron (ZOFRAN) 4 MG tablet   pantoprazole (PROTONIX) 40 MG tablet   promethazine (PHENERGAN) 25 MG suppository   promethazine (PHENERGAN) 25 MG/ML injection   traZODone (DESYREL) 50 MG tablet   vitamin C (ASCORBIC ACID) 500 MG tablet   collagenase (SANTYL) ointment    [name], PA-C Surgical Short Stay/Anesthesiology [facility] Phone [phone] [facility] Phone [phone] 10/28/2020 2:57 PM

## 2020-10-29 ENCOUNTER — Encounter (HOSPITAL_COMMUNITY)
Admission: RE | Disposition: A | Discharge status: Skilled Nursing Facility | Payer: Self-pay | Source: Skilled Nursing Facility | Attending: Orthopedic Surgery

## 2020-10-29 ENCOUNTER — Inpatient Hospital Stay (HOSPITAL_COMMUNITY): Payer: Medicare Other | Admitting: Vascular Surgery

## 2020-10-29 ENCOUNTER — Encounter (HOSPITAL_COMMUNITY): Payer: Self-pay | Admitting: Orthopedic Surgery

## 2020-10-29 ENCOUNTER — Inpatient Hospital Stay (HOSPITAL_COMMUNITY)
Admission: RE | Admit: 2020-10-29 | Discharge: 2020-11-03 | DRG: 239 | Disposition: A | Discharge status: Skilled Nursing Facility | Payer: Medicare Other | Source: Skilled Nursing Facility | Attending: Orthopedic Surgery | Admitting: Orthopedic Surgery

## 2020-10-29 ENCOUNTER — Other Ambulatory Visit: Payer: Self-pay

## 2020-10-29 DIAGNOSIS — I120 Hypertensive chronic kidney disease with stage 5 chronic kidney disease or end stage renal disease: Secondary | ICD-10-CM | POA: Diagnosis present

## 2020-10-29 DIAGNOSIS — K219 Gastro-esophageal reflux disease without esophagitis: Secondary | ICD-10-CM | POA: Diagnosis present

## 2020-10-29 DIAGNOSIS — M86271 Subacute osteomyelitis, right ankle and foot: Secondary | ICD-10-CM | POA: Diagnosis present

## 2020-10-29 DIAGNOSIS — E1169 Type 2 diabetes mellitus with other specified complication: Secondary | ICD-10-CM | POA: Diagnosis not present

## 2020-10-29 DIAGNOSIS — E1152 Type 2 diabetes mellitus with diabetic peripheral angiopathy with gangrene: Principal | ICD-10-CM | POA: Diagnosis present

## 2020-10-29 DIAGNOSIS — N186 End stage renal disease: Secondary | ICD-10-CM | POA: Diagnosis not present

## 2020-10-29 DIAGNOSIS — L97521 Non-pressure chronic ulcer of other part of left foot limited to breakdown of skin: Secondary | ICD-10-CM | POA: Diagnosis present

## 2020-10-29 DIAGNOSIS — M79671 Pain in right foot: Secondary | ICD-10-CM | POA: Diagnosis present

## 2020-10-29 DIAGNOSIS — L97411 Non-pressure chronic ulcer of right heel and midfoot limited to breakdown of skin: Secondary | ICD-10-CM | POA: Diagnosis present

## 2020-10-29 DIAGNOSIS — Z992 Dependence on renal dialysis: Secondary | ICD-10-CM | POA: Diagnosis not present

## 2020-10-29 DIAGNOSIS — E11621 Type 2 diabetes mellitus with foot ulcer: Secondary | ICD-10-CM | POA: Diagnosis not present

## 2020-10-29 DIAGNOSIS — E1122 Type 2 diabetes mellitus with diabetic chronic kidney disease: Secondary | ICD-10-CM | POA: Diagnosis not present

## 2020-10-29 DIAGNOSIS — I10 Essential (primary) hypertension: Secondary | ICD-10-CM | POA: Diagnosis present

## 2020-10-29 DIAGNOSIS — M71071 Abscess of bursa, right ankle and foot: Secondary | ICD-10-CM | POA: Diagnosis present

## 2020-10-29 DIAGNOSIS — N2581 Secondary hyperparathyroidism of renal origin: Secondary | ICD-10-CM | POA: Diagnosis not present

## 2020-10-29 DIAGNOSIS — I70268 Atherosclerosis of native arteries of extremities with gangrene, other extremity: Secondary | ICD-10-CM | POA: Diagnosis not present

## 2020-10-29 DIAGNOSIS — D631 Anemia in chronic kidney disease: Secondary | ICD-10-CM | POA: Diagnosis not present

## 2020-10-29 DIAGNOSIS — Z20822 Contact with and (suspected) exposure to covid-19: Secondary | ICD-10-CM | POA: Diagnosis present

## 2020-10-29 HISTORY — DX: Anemia, unspecified: D64.9

## 2020-10-29 HISTORY — DX: Gastro-esophageal reflux disease without esophagitis: K21.9

## 2020-10-29 HISTORY — DX: Dyspnea, unspecified: R06.00

## 2020-10-29 HISTORY — PX: AMPUTATION: SHX166

## 2020-10-29 LAB — GLUCOSE, CAPILLARY
Glucose-Capillary: 133 mg/dL — ABNORMAL HIGH (ref 70–99)
Glucose-Capillary: 134 mg/dL — ABNORMAL HIGH (ref 70–99)
Glucose-Capillary: 145 mg/dL — ABNORMAL HIGH (ref 70–99)
Glucose-Capillary: 169 mg/dL — ABNORMAL HIGH (ref 70–99)
Glucose-Capillary: 244 mg/dL — ABNORMAL HIGH (ref 70–99)
Glucose-Capillary: 289 mg/dL — ABNORMAL HIGH (ref 70–99)
Glucose-Capillary: 298 mg/dL — ABNORMAL HIGH (ref 70–99)

## 2020-10-29 LAB — POCT I-STAT, CHEM 8
BUN: 30 mg/dL — ABNORMAL HIGH (ref 6–20)
Calcium, Ion: 1.15 mmol/L (ref 1.15–1.40)
Chloride: 98 mmol/L (ref 98–111)
Creatinine, Ser: 3.8 mg/dL — ABNORMAL HIGH (ref 0.61–1.24)
Glucose, Bld: 138 mg/dL — ABNORMAL HIGH (ref 70–99)
HCT: 28 % — ABNORMAL LOW (ref 39.0–52.0)
Hemoglobin: 9.5 g/dL — ABNORMAL LOW (ref 13.0–17.0)
Potassium: 3.8 mmol/L (ref 3.5–5.1)
Sodium: 135 mmol/L (ref 135–145)
TCO2: 29 mmol/L (ref 22–32)

## 2020-10-29 LAB — RENAL FUNCTION PANEL
Albumin: 2.1 g/dL — ABNORMAL LOW (ref 3.5–5.0)
Anion gap: 10 (ref 5–15)
BUN: 35 mg/dL — ABNORMAL HIGH (ref 6–20)
CO2: 25 mmol/L (ref 22–32)
Calcium: 8.4 mg/dL — ABNORMAL LOW (ref 8.9–10.3)
Chloride: 97 mmol/L — ABNORMAL LOW (ref 98–111)
Creatinine, Ser: 4.07 mg/dL — ABNORMAL HIGH (ref 0.61–1.24)
GFR, Estimated: 17 mL/min — ABNORMAL LOW (ref >60)
Glucose, Bld: 247 mg/dL — ABNORMAL HIGH (ref 70–99)
Phosphorus: 3.5 mg/dL (ref 2.5–4.6)
Potassium: 4.5 mmol/L (ref 3.5–5.1)
Sodium: 132 mmol/L — ABNORMAL LOW (ref 135–145)

## 2020-10-29 LAB — CBC
HCT: 29.8 % — ABNORMAL LOW (ref 39.0–52.0)
Hemoglobin: 8.6 g/dL — ABNORMAL LOW (ref 13.0–17.0)
MCH: 24 pg — ABNORMAL LOW (ref 26.0–34.0)
MCHC: 28.9 g/dL — ABNORMAL LOW (ref 30.0–36.0)
MCV: 83.2 fL (ref 80.0–100.0)
Platelets: 320 10*3/uL (ref 150–400)
RBC: 3.58 MIL/uL — ABNORMAL LOW (ref 4.22–5.81)
RDW: 17.2 % — ABNORMAL HIGH (ref 11.5–15.5)
WBC: 12.1 10*3/uL — ABNORMAL HIGH (ref 4.0–10.5)
nRBC: 0 % (ref 0.0–0.2)

## 2020-10-29 LAB — TYPE AND SCREEN
ABO/RH(D): A POS
Antibody Screen: NEGATIVE

## 2020-10-29 LAB — SARS CORONAVIRUS 2 (TAT 6-24 HRS): SARS Coronavirus 2: NEGATIVE

## 2020-10-29 LAB — SARS CORONAVIRUS 2 BY RT PCR (HOSPITAL ORDER, PERFORMED IN ~~LOC~~ HOSPITAL LAB): SARS Coronavirus 2: NEGATIVE

## 2020-10-29 SURGERY — AMPUTATION BELOW KNEE
Anesthesia: Monitor Anesthesia Care | Site: Knee | Laterality: Right

## 2020-10-29 MED ORDER — MIDAZOLAM HCL 2 MG/2ML IJ SOLN
INTRAMUSCULAR | Status: AC
  Filled 2020-10-29: qty 2

## 2020-10-29 MED ORDER — SODIUM CHLORIDE 0.9 % IV SOLN: 100.0000 mL | INTRAVENOUS | Status: DC | PRN

## 2020-10-29 MED ORDER — HYDROMORPHONE HCL 1 MG/ML IJ SOLN
0.2500 mg | INTRAMUSCULAR | Status: DC | PRN
  Administered 2020-10-29 (×2): 0.5 mg via INTRAVENOUS

## 2020-10-29 MED ORDER — HEPARIN SODIUM (PORCINE) 1000 UNIT/ML DIALYSIS
1000.0000 [IU] | INTRAMUSCULAR | Status: DC | PRN
  Filled 2020-10-29: qty 1

## 2020-10-29 MED ORDER — FENTANYL CITRATE (PF) 100 MCG/2ML IJ SOLN
INTRAMUSCULAR | Status: AC
  Filled 2020-10-29: qty 2

## 2020-10-29 MED ORDER — ONDANSETRON HCL 4 MG/2ML IJ SOLN: 4.0000 mg | Freq: Four times a day (QID) | INTRAMUSCULAR | Status: DC | PRN

## 2020-10-29 MED ORDER — PHENOL 1.4 % MT LIQD: 1.0000 | OROMUCOSAL | Status: DC | PRN

## 2020-10-29 MED ORDER — MAGNESIUM CITRATE PO SOLN: 1.0000 | Freq: Once | ORAL | Status: DC | PRN

## 2020-10-29 MED ORDER — TRANEXAMIC ACID-NACL 1000-0.7 MG/100ML-% IV SOLN
1000.0000 mg | Freq: Once | INTRAVENOUS | Status: AC
  Administered 2020-10-29: 1000 mg via INTRAVENOUS
  Filled 2020-10-29: qty 100

## 2020-10-29 MED ORDER — TRAZODONE HCL 50 MG PO TABS
50.0000 mg | ORAL_TABLET | Freq: Every day | ORAL | Status: DC
  Administered 2020-10-29 – 2020-11-02 (×5): 50 mg via ORAL
  Filled 2020-10-29 (×5): qty 1

## 2020-10-29 MED ORDER — POTASSIUM CHLORIDE CRYS ER 20 MEQ PO TBCR: 20.0000 meq | EXTENDED_RELEASE_TABLET | Freq: Every day | ORAL | Status: DC | PRN

## 2020-10-29 MED ORDER — RENA-VITE PO TABS
1.0000 | ORAL_TABLET | Freq: Every day | ORAL | Status: DC
  Administered 2020-10-29 – 2020-11-02 (×5): 1 via ORAL
  Filled 2020-10-29 (×5): qty 1

## 2020-10-29 MED ORDER — ALUM & MAG HYDROXIDE-SIMETH 200-200-20 MG/5ML PO SUSP: 15.0000 mL | ORAL | Status: DC | PRN

## 2020-10-29 MED ORDER — LIDOCAINE 2% (20 MG/ML) 5 ML SYRINGE
INTRAMUSCULAR | Status: DC | PRN
  Administered 2020-10-29: 60 mg via INTRAVENOUS

## 2020-10-29 MED ORDER — FENTANYL CITRATE (PF) 100 MCG/2ML IJ SOLN
INTRAMUSCULAR | Status: DC | PRN
  Administered 2020-10-29 (×3): 50 ug via INTRAVENOUS

## 2020-10-29 MED ORDER — LANTHANUM CARBONATE 500 MG PO CHEW
500.0000 mg | CHEWABLE_TABLET | Freq: Three times a day (TID) | ORAL | Status: DC
  Administered 2020-10-29 – 2020-11-03 (×9): 500 mg via ORAL
  Filled 2020-10-29 (×17): qty 1

## 2020-10-29 MED ORDER — PROPOFOL 10 MG/ML IV BOLUS
INTRAVENOUS | Status: DC | PRN
  Administered 2020-10-29: 50 mg via INTRAVENOUS
  Administered 2020-10-29: 150 mg via INTRAVENOUS

## 2020-10-29 MED ORDER — SODIUM CHLORIDE 0.9 % IV SOLN: INTRAVENOUS | Status: DC

## 2020-10-29 MED ORDER — POLYETHYLENE GLYCOL 3350 17 G PO PACK: 17.0000 g | PACK | Freq: Every day | ORAL | Status: DC | PRN

## 2020-10-29 MED ORDER — ALBUTEROL SULFATE HFA 108 (90 BASE) MCG/ACT IN AERS: 1.0000 | INHALATION_SPRAY | RESPIRATORY_TRACT | Status: DC | PRN

## 2020-10-29 MED ORDER — FENTANYL CITRATE (PF) 250 MCG/5ML IJ SOLN
INTRAMUSCULAR | Status: AC
  Filled 2020-10-29: qty 5

## 2020-10-29 MED ORDER — DEXAMETHASONE SODIUM PHOSPHATE 10 MG/ML IJ SOLN
INTRAMUSCULAR | Status: DC | PRN
  Administered 2020-10-29: 10 mg via INTRAVENOUS

## 2020-10-29 MED ORDER — MIDAZOLAM HCL 5 MG/5ML IJ SOLN
INTRAMUSCULAR | Status: DC | PRN
  Administered 2020-10-29: 2 mg via INTRAVENOUS

## 2020-10-29 MED ORDER — PHENYLEPHRINE 40 MCG/ML (10ML) SYRINGE FOR IV PUSH (FOR BLOOD PRESSURE SUPPORT)
PREFILLED_SYRINGE | INTRAVENOUS | Status: DC | PRN
  Administered 2020-10-29: 80 ug via INTRAVENOUS

## 2020-10-29 MED ORDER — PENTAFLUOROPROP-TETRAFLUOROETH EX AERO: 1.0000 "application " | INHALATION_SPRAY | CUTANEOUS | Status: DC | PRN

## 2020-10-29 MED ORDER — ACETAMINOPHEN 325 MG PO TABS: 325.0000 mg | ORAL_TABLET | Freq: Four times a day (QID) | ORAL | Status: DC | PRN

## 2020-10-29 MED ORDER — GUAIFENESIN-DM 100-10 MG/5ML PO SYRP: 15.0000 mL | ORAL_SOLUTION | ORAL | Status: DC | PRN

## 2020-10-29 MED ORDER — CITALOPRAM HYDROBROMIDE 20 MG PO TABS
20.0000 mg | ORAL_TABLET | Freq: Every day | ORAL | Status: DC
  Administered 2020-10-30 – 2020-11-03 (×5): 20 mg via ORAL
  Filled 2020-10-29 (×5): qty 1

## 2020-10-29 MED ORDER — ONDANSETRON HCL 4 MG/2ML IJ SOLN
INTRAMUSCULAR | Status: AC
  Filled 2020-10-29: qty 2

## 2020-10-29 MED ORDER — ONDANSETRON HCL 4 MG/2ML IJ SOLN
INTRAMUSCULAR | Status: DC | PRN
  Administered 2020-10-29: 4 mg via INTRAVENOUS

## 2020-10-29 MED ORDER — NEPRO/CARBSTEADY PO LIQD
237.0000 mL | Freq: Every morning | ORAL | Status: DC
  Administered 2020-10-30 – 2020-11-03 (×4): 237 mL via ORAL

## 2020-10-29 MED ORDER — INSULIN ASPART 100 UNIT/ML IJ SOLN
0.0000 [IU] | Freq: Three times a day (TID) | INTRAMUSCULAR | Status: DC
Start: 2020-10-29 — End: 2020-10-29
  Administered 2020-10-29: 3 [IU] via SUBCUTANEOUS

## 2020-10-29 MED ORDER — ORAL CARE MOUTH RINSE
15.0000 mL | Freq: Once | OROMUCOSAL | Status: AC
Start: 2020-10-29 — End: 2020-10-29

## 2020-10-29 MED ORDER — BISACODYL 5 MG PO TBEC: 5.0000 mg | DELAYED_RELEASE_TABLET | Freq: Every day | ORAL | Status: DC | PRN

## 2020-10-29 MED ORDER — CEFAZOLIN SODIUM-DEXTROSE 2-4 GM/100ML-% IV SOLN
2.0000 g | INTRAVENOUS | Status: AC
  Administered 2020-10-29: 2 g via INTRAVENOUS
  Filled 2020-10-29: qty 100

## 2020-10-29 MED ORDER — MELATONIN 3 MG PO TABS
3.0000 mg | ORAL_TABLET | Freq: Every day | ORAL | Status: DC
  Administered 2020-10-29 – 2020-11-02 (×5): 3 mg via ORAL
  Filled 2020-10-29 (×5): qty 1

## 2020-10-29 MED ORDER — ALTEPLASE 2 MG IJ SOLR
2.0000 mg | Freq: Once | INTRAMUSCULAR | Status: DC | PRN
  Filled 2020-10-29: qty 2

## 2020-10-29 MED ORDER — MIDODRINE HCL 5 MG PO TABS
10.0000 mg | ORAL_TABLET | ORAL | Status: DC
  Administered 2020-10-30: 10 mg via ORAL
  Filled 2020-10-29: qty 2

## 2020-10-29 MED ORDER — PROMETHAZINE HCL 25 MG/ML IJ SOLN
6.2500 mg | INTRAMUSCULAR | Status: DC | PRN
Start: 2020-10-29 — End: 2020-10-29

## 2020-10-29 MED ORDER — CHLORHEXIDINE GLUCONATE CLOTH 2 % EX PADS
6.0000 | MEDICATED_PAD | Freq: Every day | CUTANEOUS | Status: DC
  Administered 2020-10-30 – 2020-11-03 (×4): 6 via TOPICAL

## 2020-10-29 MED ORDER — DOCUSATE SODIUM 100 MG PO CAPS
100.0000 mg | ORAL_CAPSULE | Freq: Every day | ORAL | Status: DC
  Administered 2020-10-30 – 2020-11-03 (×5): 100 mg via ORAL
  Filled 2020-10-29 (×5): qty 1

## 2020-10-29 MED ORDER — CHLORHEXIDINE GLUCONATE 0.12 % MT SOLN
OROMUCOSAL | Status: AC
  Administered 2020-10-29: 15 mL via OROMUCOSAL
  Filled 2020-10-29: qty 15

## 2020-10-29 MED ORDER — HYDROMORPHONE HCL 1 MG/ML IJ SOLN
0.5000 mg | INTRAMUSCULAR | Status: DC | PRN
Start: 2020-10-29 — End: 2020-11-03
  Administered 2020-10-29 – 2020-11-03 (×5): 0.5 mg via INTRAVENOUS
  Filled 2020-10-29 (×5): qty 0.5

## 2020-10-29 MED ORDER — AMLODIPINE BESYLATE 5 MG PO TABS
5.0000 mg | ORAL_TABLET | Freq: Every day | ORAL | Status: DC
  Administered 2020-10-29 – 2020-11-01 (×4): 5 mg via ORAL
  Filled 2020-10-29 (×4): qty 1

## 2020-10-29 MED ORDER — INSULIN GLARGINE 100 UNIT/ML ~~LOC~~ SOLN
11.0000 [IU] | Freq: Every day | SUBCUTANEOUS | Status: DC
  Administered 2020-10-29 – 2020-11-01 (×4): 11 [IU] via SUBCUTANEOUS
  Filled 2020-10-29 (×6): qty 0.11

## 2020-10-29 MED ORDER — INSULIN ASPART 100 UNIT/ML IJ SOLN
0.0000 [IU] | Freq: Three times a day (TID) | INTRAMUSCULAR | Status: DC
  Administered 2020-10-29: 5 [IU] via SUBCUTANEOUS
  Administered 2020-10-30: 3 [IU] via SUBCUTANEOUS
  Administered 2020-10-30: 1 [IU] via SUBCUTANEOUS
  Administered 2020-10-30: 3 [IU] via SUBCUTANEOUS
  Administered 2020-10-31 – 2020-11-02 (×5): 1 [IU] via SUBCUTANEOUS

## 2020-10-29 MED ORDER — LIDOCAINE HCL (PF) 1 % IJ SOLN
5.0000 mL | INTRAMUSCULAR | Status: DC | PRN
  Filled 2020-10-29: qty 5

## 2020-10-29 MED ORDER — OXYCODONE HCL 5 MG PO TABS
5.0000 mg | ORAL_TABLET | ORAL | Status: DC | PRN
  Administered 2020-10-29 – 2020-11-01 (×8): 10 mg via ORAL
  Administered 2020-11-02: 5 mg via ORAL
  Filled 2020-10-29: qty 2
  Filled 2020-10-29: qty 1
  Filled 2020-10-29 (×7): qty 2

## 2020-10-29 MED ORDER — OXYCODONE HCL 5 MG/5ML PO SOLN: 5.0000 mg | Freq: Once | ORAL | Status: DC | PRN

## 2020-10-29 MED ORDER — OXYCODONE HCL 5 MG PO TABS: 5.0000 mg | ORAL_TABLET | Freq: Once | ORAL | Status: DC | PRN

## 2020-10-29 MED ORDER — LIDOCAINE HCL (PF) 2 % IJ SOLN
INTRAMUSCULAR | Status: AC
  Filled 2020-10-29: qty 5

## 2020-10-29 MED ORDER — CHLORHEXIDINE GLUCONATE 0.12 % MT SOLN: 15.0000 mL | Freq: Once | OROMUCOSAL | Status: AC

## 2020-10-29 MED ORDER — DEXAMETHASONE SODIUM PHOSPHATE 10 MG/ML IJ SOLN
INTRAMUSCULAR | Status: AC
  Filled 2020-10-29: qty 1

## 2020-10-29 MED ORDER — 0.9 % SODIUM CHLORIDE (POUR BTL) OPTIME
TOPICAL | Status: DC | PRN
  Administered 2020-10-29: 1000 mL

## 2020-10-29 MED ORDER — LIDOCAINE-PRILOCAINE 2.5-2.5 % EX CREA: 1.0000 "application " | TOPICAL_CREAM | CUTANEOUS | Status: DC | PRN

## 2020-10-29 MED ORDER — HYDROMORPHONE HCL 1 MG/ML IJ SOLN
INTRAMUSCULAR | Status: AC
  Filled 2020-10-29: qty 1

## 2020-10-29 MED ORDER — PANTOPRAZOLE SODIUM 40 MG PO TBEC
40.0000 mg | DELAYED_RELEASE_TABLET | Freq: Every day | ORAL | Status: DC
  Administered 2020-10-30 – 2020-11-03 (×5): 40 mg via ORAL
  Filled 2020-10-29 (×5): qty 1

## 2020-10-29 MED ORDER — ASCORBIC ACID 500 MG PO TABS
500.0000 mg | ORAL_TABLET | Freq: Every day | ORAL | Status: DC
  Administered 2020-10-29 – 2020-10-30 (×2): 500 mg via ORAL
  Filled 2020-10-29 (×2): qty 1

## 2020-10-29 MED ORDER — CEFAZOLIN SODIUM-DEXTROSE 1-4 GM/50ML-% IV SOLN
1.0000 g | Freq: Two times a day (BID) | INTRAVENOUS | Status: AC
  Administered 2020-10-29 – 2020-10-30 (×2): 1 g via INTRAVENOUS
  Filled 2020-10-29 (×2): qty 50

## 2020-10-29 MED ORDER — ZINC SULFATE 220 (50 ZN) MG PO CAPS
220.0000 mg | ORAL_CAPSULE | Freq: Every day | ORAL | Status: DC
  Administered 2020-10-29 – 2020-11-03 (×6): 220 mg via ORAL
  Filled 2020-10-29 (×6): qty 1

## 2020-10-29 SURGICAL SUPPLY — 39 items
BLADE SAW RECIP 87.9 MT (BLADE) ×2 IMPLANT
BLADE SURG 21 STRL SS (BLADE) ×2 IMPLANT
BNDG COHESIVE 6X5 TAN STRL LF (GAUZE/BANDAGES/DRESSINGS) ×2 IMPLANT
BNDG ESMARK 4X9 LF (GAUZE/BANDAGES/DRESSINGS) ×2 IMPLANT
CANISTER WOUND CARE 500ML ATS (WOUND CARE) ×2 IMPLANT
COVER SURGICAL LIGHT HANDLE (MISCELLANEOUS) ×2 IMPLANT
COVER WAND RF STERILE (DRAPES) IMPLANT
CUFF TOURN SGL QUICK 34 (TOURNIQUET CUFF) ×1
CUFF TRNQT CYL 34X4.125X (TOURNIQUET CUFF) ×1 IMPLANT
DRAPE INCISE IOBAN 66X45 STRL (DRAPES) ×2 IMPLANT
DRAPE U-SHAPE 47X51 STRL (DRAPES) ×2 IMPLANT
DRESSING PREVENA PLUS CUSTOM (GAUZE/BANDAGES/DRESSINGS) ×1 IMPLANT
DRSG PREVENA PLUS CUSTOM (GAUZE/BANDAGES/DRESSINGS) ×2
DURAPREP 26ML APPLICATOR (WOUND CARE) ×2 IMPLANT
ELECT REM PT RETURN 9FT ADLT (ELECTROSURGICAL) ×2
ELECTRODE REM PT RTRN 9FT ADLT (ELECTROSURGICAL) ×1 IMPLANT
GLOVE BIOGEL PI IND STRL 9 (GLOVE) ×1 IMPLANT
GLOVE BIOGEL PI INDICATOR 9 (GLOVE) ×1
GLOVE SURG ORTHO 9.0 STRL STRW (GLOVE) ×2 IMPLANT
GOWN STRL REUS W/ TWL XL LVL3 (GOWN DISPOSABLE) ×2 IMPLANT
GOWN STRL REUS W/TWL XL LVL3 (GOWN DISPOSABLE) ×2
KIT BASIN OR (CUSTOM PROCEDURE TRAY) ×2 IMPLANT
KIT TURNOVER KIT B (KITS) ×2 IMPLANT
MANIFOLD NEPTUNE II (INSTRUMENTS) ×2 IMPLANT
NS IRRIG 1000ML POUR BTL (IV SOLUTION) ×2 IMPLANT
PACK ORTHO EXTREMITY (CUSTOM PROCEDURE TRAY) ×2 IMPLANT
PAD ARMBOARD 7.5X6 YLW CONV (MISCELLANEOUS) ×2 IMPLANT
PREVENA RESTOR ARTHOFORM 46X30 (CANNISTER) ×2 IMPLANT
PREVENA RESTOR AXIOFORM 29X28 (GAUZE/BANDAGES/DRESSINGS) ×2 IMPLANT
SPONGE LAP 18X18 RF (DISPOSABLE) ×2 IMPLANT
STAPLER VISISTAT 35W (STAPLE) IMPLANT
STOCKINETTE IMPERVIOUS LG (DRAPES) ×2 IMPLANT
SUT ETHILON 2 0 PSLX (SUTURE) ×2 IMPLANT
SUT SILK 2 0 (SUTURE) ×1
SUT SILK 2-0 18XBRD TIE 12 (SUTURE) ×1 IMPLANT
SUT VIC AB 1 CTX 27 (SUTURE) ×4 IMPLANT
TOWEL GREEN STERILE (TOWEL DISPOSABLE) ×2 IMPLANT
TUBE CONNECTING 12X1/4 (SUCTIONS) ×2 IMPLANT
YANKAUER SUCT BULB TIP NO VENT (SUCTIONS) ×2 IMPLANT

## 2020-10-29 NOTE — Consult Note (Addendum)
[facility] Renal Consultation Note    Indication for Consultation:  Management of ESRD/hemodialysis; anemia, hypertension/volume and secondary hyperparathyroidism  PCP:Pcp, No  HPI: Fred [name] is a 50 y.o. male. ESRD on HD TTS at [facility].  Past medical history significant for DMT2, HTN, HLD, GERD and R foot ulcer.  Of note patient has had improved compliance with dialysis over the last month.  EDW has been lowered over the last few weeks due to increased edema and suspected weight loss.   Patient presents to [facility] today for scheduled R BKA due to non healing R foot ulcer, abscess and osteomyelitis.  Seen by Dr. Chaya Jan 10/04/2020 about new permanent access placement and noted suspected OM of R foot.  Dr. Trula Slade stated L sided PICC line would need to be removed prior to proceeding with brachiocephalic fistula.  Patient was referred back to Dr. Sharol Given who evaluated and determined necessary to proceed with amputation.   Patient seen and examined post surgery in PACU.  Reports feeling ok except for a little phantom pain.  Denies CP, SOB, n/v/d, abdominal pain and fatigue.  Tolerating lunch well.  States PICC line was removed prior to admission.  He is hoping he will be accepted to inpatient rehab, states it was very beneficial during his last admission and he wants to be more mobile. Patient admitted for further evaluation and management post BKA.  Past Medical History:  Diagnosis Date   Anemia    takes iron pills   Chronic kidney disease    HD MWF   Diabetes mellitus without complication ([place])    type 2   Dyspnea    SOB - uses inhaler   GERD (gastroesophageal reflux disease)    Hypertension    Past Surgical History:  Procedure Laterality Date   CHOLECYSTECTOMY     History reviewed. No pertinent family history. Social History:  reports that he has never smoked. He has never used smokeless tobacco. He reports that he does not drink alcohol and does not use drugs. Allergies   Allergen Reactions   Atorvastatin Hives    NOT on MAR   Trazodone Hcl Other (See Comments)    Confusion (intolerance)   Prior to Admission medications   Medication Sig Start Date End Date Taking? Authorizing Provider  acetaminophen (TYLENOL) 325 MG tablet Take 650 mg by mouth every 6 (six) hours as needed for mild pain or fever (>101 F).   Yes [provider]  albuterol (VENTOLIN HFA) 108 (90 Base) MCG/ACT inhaler Inhale 1-2 puffs into the lungs every 4 (four) hours as needed for wheezing or shortness of breath. 07/15/20  Yes Angiulli, Lavon Paganini, PA-C  Amino Acids-Protein Hydrolys (PRO-STAT SUGAR FREE PO) Take 30 mLs by mouth in the morning and at bedtime.   Yes [provider]  amLODipine (NORVASC) 5 MG tablet Take 1 tablet (5 mg total) by mouth at bedtime. Patient taking differently: Take 5 mg by mouth See admin instructions. Take 1 tablet (5 mg) by mouth on morning of dialysis Tuesdays, Thursdays & Saturdays. Hold if SBP<120 07/15/20  Yes Angiulli, Lavon Paganini, PA-C  Cholecalciferol (VITAMIN D3) 50 MCG (2000 UT) TABS Take 2,000 Units by mouth daily.   Yes [provider]  citalopram (CELEXA) 20 MG tablet Take 1 tablet (20 mg total) by mouth daily. Patient taking differently: Take 20 mg by mouth in the morning. 07/16/20  Yes Angiulli, Lavon Paganini, PA-C  ferrous sulfate 325 (65 FE) MG tablet Take 325 mg by mouth  in the morning.   Yes [provider]  insulin glargine (LANTUS) 100 UNIT/ML injection Inject 0.11 mLs (11 Units total) into the skin at bedtime. 07/15/20  Yes Angiulli, Lavon Paganini, PA-C  lanthanum (FOSRENOL) 500 MG chewable tablet Chew 500 mg by mouth 3 (three) times daily with meals.   Yes [provider]  melatonin 3 MG TABS tablet Take 3 mg by mouth at bedtime. (1900)   Yes [provider]  metroNIDAZOLE (FLAGYL) 500 MG tablet Take 500 mg by mouth See admin instructions. Cleanse right heel with normal saline first, then take 1 tablet (500  mg) crush tablet and place in wound bed, pack with dakins soaked gauze and cover with dry dressing.   Yes [provider]  midodrine (PROAMATINE) 10 MG tablet Take 10 mg by mouth See admin instructions. Take 1 tablet (10 mg) by mouth before dialysis on Tuesdays, Thursdays & Saturdays   Yes [provider]  multivitamin (RENA-VIT) TABS tablet Take 1 tablet by mouth at bedtime.   Yes [provider]  Nutritional Supplements (NEPRO/CARBSTEADY PO) Take 237 mLs by mouth in the morning.   Yes [provider]  pantoprazole (PROTONIX) 40 MG tablet Take 1 tablet (40 mg total) by mouth daily at 6 (six) AM. 07/16/20  Yes Angiulli, Lavon Paganini, PA-C  traZODone (DESYREL) 50 MG tablet Take 50 mg by mouth at bedtime.   Yes [provider]  vitamin C (ASCORBIC ACID) 500 MG tablet Take 500 mg by mouth in the morning.   Yes [provider]  cadexomer iodine (IODOSORB) 0.9 % gel Apply 1 application topically every other day. Applied to left 4th toe    [provider]  collagenase (SANTYL) ointment Apply topically daily. Patient not taking: No sig reported 07/16/20   Angiulli, Lavon Paganini, PA-C  ondansetron (ZOFRAN) 4 MG tablet Take 1 tablet (4 mg total) by mouth every 6 (six) hours as needed for nausea. 07/15/20   Angiulli, Lavon Paganini, PA-C  promethazine (PHENERGAN) 25 MG suppository Place 25 mg rectally every 6 (six) hours as needed for nausea or vomiting.    [provider]  promethazine (PHENERGAN) 25 MG/ML injection Inject 25 mg into the vein every 6 (six) hours as needed for nausea or vomiting.    [provider]   Current Facility-Administered Medications  Medication Dose Route Frequency Provider Last Rate Last Admin   0.9 %  sodium chloride infusion   Intravenous Continuous Lynda Rainwater, MD 10 mL/hr at 10/29/20 1005 Restarted at 10/29/20 1042   HYDROmorphone (DILAUDID) injection 0.25-0.5 mg  0.25-0.5 mg Intravenous Q5 min PRN Lynda Rainwater, MD       oxyCODONE (Oxy IR/ROXICODONE) immediate release tablet 5 mg  5 mg Oral Once PRN Lynda Rainwater, MD       Or   oxyCODONE (ROXICODONE) 5 MG/5ML solution 5 mg  5 mg Oral Once PRN Lynda Rainwater, MD       promethazine (PHENERGAN) injection 6.25-12.5 mg  6.25-12.5 mg Intravenous Q15 min PRN Lynda Rainwater, MD       Labs: Basic Metabolic Panel: Recent Labs  Lab 10/29/20 0816  NA 135  K 3.8  CL 98  GLUCOSE 138*  BUN 30*  CREATININE 3.80*    CBC: Recent Labs  Lab 10/29/20 0816  HGB 9.5*  HCT 28.0*    CBG: Recent Labs  Lab 10/29/20 0651 10/29/20 0846 10/29/20 1121  GLUCAP 133* 134* 145*    ROS: All  others negative except those listed in HPI.   Physical Exam: Vitals:   10/29/20 1057 10/29/20 1112 10/29/20 1127 10/29/20 1200  BP: 90/68 114/75 138/90 138/90  Pulse: 76 84 85 78  Resp: '14 13 11 18  '$ Temp: (!) 97.5 F (36.4 C)  97.8 F (36.6 C)   TempSrc:      SpO2: 93% 99% 99% 96%  Weight:      Height:         General: WDWN male in NAD sitting up in bed eating lunch Head: NCAT sclera not icteric MMM Neck: Supple. No lymphadenopathy Lungs: CTA bilaterally. No wheeze, rales or rhonchi. Breathing is unlabored. Heart: RRR. No murmur, rubs or gallops.  Abdomen: soft, nontender, +BS, no guarding, no rebound tenderness Lower extremities:R BKA in canister, LLE no edema Neuro: AAOx3. Moves all extremities spontaneously. Psych:  Responds to questions appropriately with a flat affect. Dialysis Access:  [facility] c/d/i  Dialysis Orders:  TTS - [facility]  4hrs, BFR 400, DFR 500,  EDW 115.5kg, 3K/ 2.5Ca  Access: TDC  Heparin 3000 unit bolus Mircera 225 mcg q2wks - last 10/28/20  Assessment/Plan:  R foot abscess/osteomyelitis - non healing with ABX/wound care. S/p R BKA today with application of prevena wound vac.    ESRD - on HD TTS. Plan for HD tomorrow per regular schedule.  Hold heparin d/t surgery today. K 3.8.   Hypertension/volume  - Blood  pressure mostly well controlled. Not on antihypertensives as outpatient.  EDW recently lowered due to weight loss.  Does not appear grossly volume overloaded.  UF as tolerated.   Anemia of CKD - Hgb 9.5 pre surgery, expect post surgery drop.  Transfuse prn.  ESA dosed yesterday as outpatient.   Secondary Hyperparathyroidism - Will check Ca and phos.  Typically at goal.  Not on VDRA or binders.   Nutrition - Renal diet w/fluid restrictions.  Diabetes - per primary  Jen Mow, PA-C [facility] 10/29/2020, 12:25 PM   Nephrology attending: Patient was seen and examined in PACU.  Chart reviewed.  I agree with assessment and plan as outlined above.  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 Sharol Given.  He is recovering well in PACU.  Plan for regular dialysis tomorrow as per TTS schedule.  Continue ESA for anemia.  Monitor lab.  He will benefit from PT OT evaluation.  Katheran James, MD [facility].

## 2020-10-29 NOTE — Interval H&P Note (Signed)
History and Physical Interval Note:  10/29/2020 6:52 AM  Fred [name]  has presented today for surgery, with the diagnosis of Abscess, Osteomyelitis Right Foot.  The various methods of treatment have been discussed with the patient and family. After consideration of risks, benefits and other options for treatment, the patient has consented to  Procedure(s): RIGHT BELOW KNEE AMPUTATION (Right) as a surgical intervention.  The patient's history has been reviewed, patient examined, no change in status, stable for surgery.  I have reviewed the patient's chart and labs.  Questions were answered to the patient's satisfaction.     Newt Minion

## 2020-10-29 NOTE — Progress Notes (Signed)
Inpatient Diabetes Program Recommendations  AACE/ADA: New Consensus Statement on Inpatient Glycemic Control  Target Ranges:  Prepandial:   less than 140 mg/dL      Peak postprandial:   less than 180 mg/dL (1-2 hours)      Critically ill patients:  140 - 180 mg/dL   Results for ZIYANG, ORIO (MRN [id number]) as of 10/29/2020 15:15  Ref. Range 10/29/2020 06:51 10/29/2020 08:46 10/29/2020 11:21 10/29/2020 13:17  Glucose-Capillary Latest Ref Range: 70 - 99 mg/dL 133 (H) 134 (H) 145 (H) 169 (H)    Review of Glycemic Control  Diabetes history: DM2 Outpatient Diabetes medications: Lantus 11 units QHS Current orders for Inpatient glycemic control: Lantus 11 units QHS, Novolog 0-9 units TID   NOTE: Noted consult for diabetes coordinator for DM control. Chart reviewed. Per chart, patient from SNF and has Lantus 11 units QHS listed as only outpatient DM medication. Noted patient had BKA surgery today and received Decadron 10 mg at 10:21 am today which is likely to contribute to hyperglycemia. Agree with current orders. Will continue to follow along while inpatient.  Thanks, Barnie Alderman, RN, MSN, CDE Diabetes Coordinator Inpatient Diabetes Program [phone] (Team Pager from 8am to 5pm)

## 2020-10-29 NOTE — Progress Notes (Signed)
Pt arrived to unit from PACU. Alert and Oriented X4. Vitals stable. Pt complains of 4/10 pain. Stump shrinker is on and wound vac plugged into the wall.

## 2020-10-29 NOTE — Anesthesia Postprocedure Evaluation (Signed)
Anesthesia Post Note  Patient: [name]  Procedure(s) Performed: RIGHT BELOW KNEE AMPUTATION (Right: Knee)     Patient location during evaluation: PACU Anesthesia Type: Regional and General Level of consciousness: awake and alert Pain management: pain level controlled Vital Signs Assessment: post-procedure vital signs reviewed and stable Respiratory status: spontaneous breathing, nonlabored ventilation and respiratory function stable Cardiovascular status: blood pressure returned to baseline and stable Postop Assessment: no apparent nausea or vomiting Anesthetic complications: no   No notable events documented.  Last Vitals:  Vitals:   10/29/20 1200 10/29/20 1230  BP: 138/90 (!) 162/94  Pulse: 78 82  Resp: 18 15  Temp:    SpO2: 96% 99%    Last Pain:  Vitals:   10/29/20 1230  TempSrc:   PainSc: 0-No pain        RLE Motor Response: Purposeful movement (10/29/20 1230) RLE Sensation: Decreased (10/29/20 1230)      Lynda Rainwater

## 2020-10-29 NOTE — Transfer of Care (Signed)
Immediate Anesthesia Transfer of Care Note  Patient: Fred [name]  Procedure(s) Performed: RIGHT BELOW KNEE AMPUTATION (Right: Knee)  Patient Location: PACU  Anesthesia Type:General  Level of Consciousness: drowsy and patient cooperative  Airway & Oxygen Therapy: Patient Spontanous Breathing and Patient connected to face mask oxygen  Post-op Assessment: Report given to RN and Post -op Vital signs reviewed and stable  Post vital signs: Reviewed and stable  Last Vitals:  Vitals Value Taken Time  BP 90/68 10/29/20 1057  Temp 36.4 C 10/29/20 1057  Pulse 77 10/29/20 1058  Resp 10 10/29/20 1058  SpO2 96 % 10/29/20 1058  Vitals shown include unvalidated device data.  Last Pain:  Vitals:   10/29/20 0800  TempSrc:   PainSc: 0-No pain      Patients Stated Pain Goal: 5 ([id number])  Complications: No notable events documented.

## 2020-10-29 NOTE — H&P (Signed)
Fred [name] is an 50 y.o. male.   Chief Complaint: Right Foot Osteomyelitis HPI: Patient is a 50 year old gentleman resident of [facility] who is seen for initial evaluation for ulceration abscess and osteomyelitis right hindfoot.  Patient has a history of diabetes hypertension end-stage renal disease with dialysis Tuesday Thursday Saturday.  Patient states that he is then scheduled for a fistula for dialysis that has been underway since November.  Patient is currently undergoing silver alginate dressing changes he complains of swelling and drainage and odor.  Patient also states he does have some superficial ulcers to the left foot fourth and fifth toes.   Patient states that he has previously been hospitalized for sepsis secondary to cholecystitis he denies any history of stroke.  Past Medical History:  Diagnosis Date   Anemia    takes iron pills   Chronic kidney disease    HD MWF   Diabetes mellitus without complication ([place])    type 2   Dyspnea    SOB - uses inhaler   GERD (gastroesophageal reflux disease)    Hypertension     Past Surgical History:  Procedure Laterality Date   CHOLECYSTECTOMY      History reviewed. No pertinent family history. Social History:  reports that he has never smoked. He has never used smokeless tobacco. He reports that he does not drink alcohol and does not use drugs.  Allergies:  Allergies  Allergen Reactions   Atorvastatin Hives    NOT on MAR   Trazodone Hcl Other (See Comments)    Confusion (intolerance)    Medications Prior to Admission  Medication Sig Dispense Refill   acetaminophen (TYLENOL) 325 MG tablet Take 650 mg by mouth every 6 (six) hours as needed for mild pain or fever (>101 F).     albuterol (VENTOLIN HFA) 108 (90 Base) MCG/ACT inhaler Inhale 1-2 puffs into the lungs every 4 (four) hours as needed for wheezing or shortness of breath.     Amino Acids-Protein Hydrolys (PRO-STAT SUGAR FREE PO) Take 30 mLs by mouth  in the morning and at bedtime.     amLODipine (NORVASC) 5 MG tablet Take 1 tablet (5 mg total) by mouth at bedtime. (Patient taking differently: Take 5 mg by mouth See admin instructions. Take 1 tablet (5 mg) by mouth on morning of dialysis Tuesdays, Thursdays & Saturdays. Hold if SBP<120)     cadexomer iodine (IODOSORB) 0.9 % gel Apply 1 application topically every other day. Applied to left 4th toe     Cholecalciferol (VITAMIN D3) 50 MCG (2000 UT) TABS Take 2,000 Units by mouth daily.     citalopram (CELEXA) 20 MG tablet Take 1 tablet (20 mg total) by mouth daily. (Patient taking differently: Take 20 mg by mouth in the morning.)     ferrous sulfate 325 (65 FE) MG tablet Take 325 mg by mouth in the morning.     insulin glargine (LANTUS) 100 UNIT/ML injection Inject 0.11 mLs (11 Units total) into the skin at bedtime. 10 mL 11   lanthanum (FOSRENOL) 500 MG chewable tablet Chew 500 mg by mouth 3 (three) times daily with meals.     melatonin 3 MG TABS tablet Take 3 mg by mouth at bedtime. (1900)     metroNIDAZOLE (FLAGYL) 500 MG tablet Take 500 mg by mouth See admin instructions. Cleanse right heel with normal saline first, then take 1 tablet (500 mg) crush tablet and place in wound bed, pack with dakins soaked gauze and cover with  dry dressing.     midodrine (PROAMATINE) 10 MG tablet Take 10 mg by mouth See admin instructions. Take 1 tablet (10 mg) by mouth before dialysis on Tuesdays, Thursdays & Saturdays     multivitamin (RENA-VIT) TABS tablet Take 1 tablet by mouth at bedtime.     Nutritional Supplements (NEPRO/CARBSTEADY PO) Take 237 mLs by mouth in the morning.     ondansetron (ZOFRAN) 4 MG tablet Take 1 tablet (4 mg total) by mouth every 6 (six) hours as needed for nausea. 20 tablet 0   pantoprazole (PROTONIX) 40 MG tablet Take 1 tablet (40 mg total) by mouth daily at 6 (six) AM.     promethazine (PHENERGAN) 25 MG suppository Place 25 mg rectally every 6 (six) hours as needed for nausea or  vomiting.     promethazine (PHENERGAN) 25 MG/ML injection Inject 25 mg into the vein every 6 (six) hours as needed for nausea or vomiting.     traZODone (DESYREL) 50 MG tablet Take 50 mg by mouth at bedtime.     vitamin C (ASCORBIC ACID) 500 MG tablet Take 500 mg by mouth in the morning.     collagenase (SANTYL) ointment Apply topically daily. (Patient not taking: No sig reported) 15 g 0    No results found for this or any previous visit (from the past 48 hour(s)). No results found.  Review of Systems  All other systems reviewed and are negative.  Weight 111.1 kg. Physical Exam  Patient is alert, oriented, no adenopathy, well-dressed, normal affect, normal respiratory effort. Examination patient has significant swelling the right lower extremity pulses not palpable.  The Doppler was used and he has a biphasic dorsalis pedis pulse with a triphasic anterior tibial pulse on the right no arterial insufficiency.  Patient does have a massive necrotic ulcer over the right heel with cellulitis purulent drainage odor and an ulcer that probes to bone.  Patient's has a albumin of 2.0.  No current hemoglobin A1c on record.Heart RRR Lungs Clear   Assessment/Plan Subacute osteomyelitis, right ankle and foot ([place])       Plan: Patient will be set up for a transtibial amputation this Friday on the right.  Anticipate return back to [facility].    Bevely Palmer Kevina Piloto, PA 10/29/2020, 6:32 AM

## 2020-10-29 NOTE — Evaluation (Signed)
Physical Therapy Evaluation Patient Details Name: Fred [name] MRN: [id number] DOB: Feb 03, 1971 Today's Date: 10/29/2020   History of Present Illness  The pt is a 49 yo male presenting 6/10 for R BKA due to ongoing pain and osteomyelitis. PMH includes: DM II, HTN, ESRD on HD, anemia, GERD,  cholecystectomy 03/23/20, obesity, and COVID (1/22).   Clinical Impression  Pt in bed upon arrival of PT, agreeable to evaluation at this time. Prior to admission the pt was dependent on hoyer lift for mobility and assist from staff with lower body dressing due to NWB orders for BLE to reduce wt bearing to allow for wound healing. The pt now presents with limitations in functional mobility, strength, power, and coordination due to above dx, and will continue to benefit from skilled PT to address these deficits. The pt was able to complete all rolling in bed without assist, but required minA to complete transition to sitting EOB. The pt mostly was able to maintain sitting with minG for safety, but intermittently needed minA to steady with movement of BLE. The pt was unable to complete lateral scoot transfer due to inability to clear hips from bed and therefore would be generating shear forces on sacral wound. The pt is highly motivated to regain strength and be able to stand again in the future, will benefit from skilled PT to improve functional strength and ability to transfer with reduced need for assistance. Safe to return to SNF when medically cleared.      Follow Up Recommendations SNF;Supervision/Assistance - 24 hour    Equipment Recommendations  None recommended by PT    Recommendations for Other Services       Precautions / Restrictions Precautions Precautions: Fall Precaution Comments: IV sacral ulcer Required Braces or Orthoses: Other Brace Other Brace: limb protector Restrictions Weight Bearing Restrictions: Yes RLE Weight Bearing: Non weight bearing      Mobility  Bed Mobility Overal  bed mobility: Needs Assistance Bed Mobility: Rolling;Sidelying to Sit;Sit to Sidelying Rolling: Supervision Sidelying to sit: Min assist     Sit to sidelying: Min assist General bed mobility comments: supervision for rolling (with use of bed rail), minA to complete transition to sitting (elevation of trunk), pt able to move himself and reposition hips    Transfers Overall transfer level: Needs assistance Equipment used: None Transfers: Lateral/Scoot Transfers          Lateral/Scoot Transfers: Total assist General transfer comment: pt unable to generate hip clearance or scoot without shear force on hips     Balance Overall balance assessment: Needs assistance Sitting-balance support: No upper extremity supported;Feet supported Sitting balance-Leahy Scale: Good                                       Pertinent Vitals/Pain Pain Assessment: 0-10 Pain Score: 7  Pain Location: R BKA site Pain Descriptors / Indicators: Discomfort;Grimacing;Sore;Sharp Pain Intervention(s): Limited activity within patient's tolerance;Monitored during session;Repositioned;Patient requesting pain meds-RN notified    Home Living Family/patient expects to be discharged to:: Skilled nursing facility Living Arrangements:  ([name])               Additional Comments: pt living at [name], has been using hoyer for 2 months to avoid pressure on feet to allow for wound healing    Prior Function Level of Independence: Needs assistance   Gait / Transfers Assistance Needed: pt reports 2 months of lift to  avoid pressure on bilateral feet wounds  ADL's / Homemaking Assistance Needed: Staff assisting with bathing, dressing. He is able to feed himself and brush his teeth per pt report        Hand Dominance   Dominant Hand: Right    Extremity/Trunk Assessment   Upper Extremity Assessment Upper Extremity Assessment: Generalized weakness    Lower Extremity Assessment Lower  Extremity Assessment: Generalized weakness;RLE deficits/detail RLE Deficits / Details: new BKA, pt able to complete hip ABD against gravity and SLR RLE: Unable to fully assess due to pain RLE Sensation: decreased light touch    Cervical / Trunk Assessment Cervical / Trunk Assessment: Other exceptions Cervical / Trunk Exceptions: large body habitus, IV sacral ulcer  Communication   Communication: No difficulties  Cognition Arousal/Alertness: Awake/alert Behavior During Therapy: WFL for tasks assessed/performed Overall Cognitive Status: Within Functional Limits for tasks assessed                                        General Comments General comments (skin integrity, edema, etc.): VSS, pt with multiple wounds to L foot, ankle, and toes. IV sacral ulcer, discussed phantom limb pain managemetn    Exercises General Exercises - Lower Extremity Quad Sets: AROM;Both;5 reps;Supine Hip ABduction/ADduction: AROM;Right;10 reps;Sidelying Straight Leg Raises: AROM;Right;5 reps;Supine   Assessment/Plan    PT Assessment Patient needs continued PT services  PT Problem List Decreased strength;Decreased range of motion;Decreased activity tolerance;Decreased balance;Decreased mobility;Pain;Decreased skin integrity       PT Treatment Interventions DME instruction;Functional mobility training;Therapeutic activities;Therapeutic exercise;Balance training    PT Goals (Current goals can be found in the Care Plan section)  Acute Rehab PT Goals Patient Stated Goal: to get stronger PT Goal Formulation: With patient Time For Goal Achievement: 11/12/20 Potential to Achieve Goals: Fair    Frequency Min 2X/week   Barriers to discharge       AM-PAC PT "6 Clicks" Mobility  Outcome Measure Help needed turning from your back to your side while in a flat bed without using bedrails?: None Help needed moving from lying on your back to sitting on the side of a flat bed without using  bedrails?: A Little Help needed moving to and from a bed to a chair (including a wheelchair)?: Total Help needed standing up from a chair using your arms (e.g., wheelchair or bedside chair)?: Total Help needed to walk in hospital room?: Total Help needed climbing 3-5 steps with a railing? : Total 6 Click Score: 11    End of Session   Activity Tolerance: Patient tolerated treatment well Patient left: in bed;with call bell/phone within reach;with bed alarm set Nurse Communication: Mobility status;Patient requests pain meds;Need for lift equipment PT Visit Diagnosis: Other abnormalities of gait and mobility (R26.89);Muscle weakness (generalized) (M62.81)    Time: [id number] PT Time Calculation (min) (ACUTE ONLY): 29 min   Charges:   PT Evaluation $PT Eval Low Complexity: 1 Low PT Treatments $Therapeutic Exercise: 8-22 mins        Karma Ganja, PT, DPT   Acute Rehabilitation Department Pager #: [phone]  Otho Bellows 10/29/2020, 6:56 PM

## 2020-10-29 NOTE — Op Note (Signed)
   Date of Surgery: 10/29/2020  INDICATIONS: [name] is a 50 y.o.-year-old male who presents with osteomyelitis and abscess right foot.  PREOPERATIVE DIAGNOSIS: abscess, osteomyelitis right foot  POSTOPERATIVE DIAGNOSIS: Same.  PROCEDURE: Transtibial amputation right Application of Prevena wound VAC  SURGEON: Sharol Given, M.D.  ANESTHESIA:  general  IV FLUIDS AND URINE: See anesthesia records.  ESTIMATED BLOOD LOSS: See anesthesia records.  COMPLICATIONS: None.  DESCRIPTION OF PROCEDURE: The patient was brought to the operating room after undergoing regional anesthetic. After adequate levels of anesthesia were obtained patient's lower extremity was prepped using DuraPrep draped into a sterile field. A timeout was called. The foot was draped out of the sterile field with impervious stockinette. A transverse incision was made 11 cm distal to the tibial tubercle. This curved proximally and a large posterior flap was created. The tibia was transected 1 cm proximal to the skin incision. The fibula was transected just proximal to the tibial incision. The tibia was beveled anteriorly. A large posterior flap was created. The sciatic nerve was pulled cut and allowed to retract. The vascular bundles were suture ligated with 2-0 silk. The deep and superficial fascial layers were closed using #1 Vicryl. The skin was closed using staples and 2-0 nylon. The wound was covered with a Prevena customizable and arthroform wound VAC.  The dressing was sealed with dermatac there was a good suction fit. A prosthetic shrinker will be applied in patient's room. Patient was taken to the PACU in stable condition.   DISCHARGE PLANNING:  Antibiotic duration: 24 hours  Weightbearing: Nonweightbearing on the operative extremity  Pain medication: Opioid pathway  Dressing care/ Wound VAC: Continue wound VAC for 1 week after discharge  Discharge to: Discharge planning based on therapy's recommendations for possible  inpatient rehabilitation, outpatient rehabilitation, or discharge to home with therapy  Follow-up: In the office 1 week post operative.  Meridee Score, MD [place] 10:56 AM

## 2020-10-29 NOTE — Anesthesia Procedure Notes (Signed)
Procedure Name: LMA Insertion Date/Time: 10/29/2020 10:14 AM Performed by: Genelle Bal, CRNA Pre-anesthesia Checklist: Patient identified, Emergency Drugs available, Suction available and Patient being monitored Patient Re-evaluated:Patient Re-evaluated prior to induction Oxygen Delivery Method: Circle system utilized Preoxygenation: Pre-oxygenation with 100% oxygen Induction Type: IV induction Ventilation: Mask ventilation without difficulty LMA: LMA inserted LMA Size: 5.0 Number of attempts: 1 Airway Equipment and Method: Bite block Placement Confirmation: positive ETCO2 Tube secured with: Tape Dental Injury: Teeth and Oropharynx as per pre-operative assessment

## 2020-10-29 NOTE — Plan of Care (Signed)

## 2020-10-30 ENCOUNTER — Encounter (HOSPITAL_COMMUNITY): Payer: Self-pay | Admitting: Orthopedic Surgery

## 2020-10-30 LAB — BASIC METABOLIC PANEL
Anion gap: 13 (ref 5–15)
BUN: 42 mg/dL — ABNORMAL HIGH (ref 6–20)
CO2: 23 mmol/L (ref 22–32)
Calcium: 8.3 mg/dL — ABNORMAL LOW (ref 8.9–10.3)
Chloride: 97 mmol/L — ABNORMAL LOW (ref 98–111)
Creatinine, Ser: 4.58 mg/dL — ABNORMAL HIGH (ref 0.61–1.24)
GFR, Estimated: 15 mL/min — ABNORMAL LOW (ref >60)
Glucose, Bld: 277 mg/dL — ABNORMAL HIGH (ref 70–99)
Potassium: 4 mmol/L (ref 3.5–5.1)
Sodium: 133 mmol/L — ABNORMAL LOW (ref 135–145)

## 2020-10-30 LAB — IRON AND TIBC
Iron: 43 ug/dL — ABNORMAL LOW (ref 45–182)
Saturation Ratios: 37 % (ref 17.9–39.5)
TIBC: 115 ug/dL — ABNORMAL LOW (ref 250–450)
UIBC: 72 ug/dL

## 2020-10-30 LAB — CBC
HCT: 27.1 % — ABNORMAL LOW (ref 39.0–52.0)
Hemoglobin: 7.9 g/dL — ABNORMAL LOW (ref 13.0–17.0)
MCH: 24.1 pg — ABNORMAL LOW (ref 26.0–34.0)
MCHC: 29.2 g/dL — ABNORMAL LOW (ref 30.0–36.0)
MCV: 82.6 fL (ref 80.0–100.0)
Platelets: 371 10*3/uL (ref 150–400)
RBC: 3.28 MIL/uL — ABNORMAL LOW (ref 4.22–5.81)
RDW: 17.2 % — ABNORMAL HIGH (ref 11.5–15.5)
WBC: 14.6 10*3/uL — ABNORMAL HIGH (ref 4.0–10.5)
nRBC: 0 % (ref 0.0–0.2)

## 2020-10-30 LAB — GLUCOSE, CAPILLARY
Glucose-Capillary: 237 mg/dL — ABNORMAL HIGH (ref 70–99)
Glucose-Capillary: 232 mg/dL — ABNORMAL HIGH (ref 70–99)
Glucose-Capillary: 137 mg/dL — ABNORMAL HIGH (ref 70–99)

## 2020-10-30 LAB — HEMOGLOBIN A1C
Hgb A1c MFr Bld: 4.8 % (ref 4.8–5.6)
Mean Plasma Glucose: 91 mg/dL

## 2020-10-30 MED ORDER — ASCORBIC ACID 500 MG PO TABS
500.0000 mg | ORAL_TABLET | Freq: Every day | ORAL | Status: DC
  Administered 2020-10-31 – 2020-11-03 (×4): 500 mg via ORAL
  Filled 2020-10-30 (×4): qty 1

## 2020-10-30 MED ORDER — HEPARIN SODIUM (PORCINE) 1000 UNIT/ML IJ SOLN
INTRAMUSCULAR | Status: AC
  Filled 2020-10-30: qty 4

## 2020-10-30 MED ORDER — LABETALOL HCL 5 MG/ML IV SOLN
10.0000 mg | Freq: Once | INTRAVENOUS | Status: AC
  Administered 2020-10-30: 10 mg via INTRAVENOUS

## 2020-10-30 MED ORDER — HYDROMORPHONE HCL 1 MG/ML IJ SOLN
INTRAMUSCULAR | Status: AC
  Administered 2020-10-30: 0.5 mg via INTRAVENOUS
  Filled 2020-10-30: qty 0.5

## 2020-10-30 MED ORDER — PROSOURCE PLUS PO LIQD
30.0000 mL | Freq: Two times a day (BID) | ORAL | Status: DC
  Administered 2020-10-31 – 2020-11-03 (×4): 30 mL via ORAL
  Filled 2020-10-30 (×4): qty 30

## 2020-10-30 MED ORDER — JUVEN PO PACK
1.0000 | PACK | Freq: Two times a day (BID) | ORAL | Status: DC
  Administered 2020-10-31 – 2020-11-03 (×4): 1 via ORAL
  Filled 2020-10-30 (×4): qty 1

## 2020-10-30 MED ORDER — ONDANSETRON HCL 4 MG/2ML IJ SOLN
INTRAMUSCULAR | Status: AC
  Filled 2020-10-30: qty 2

## 2020-10-30 MED ORDER — LABETALOL HCL 5 MG/ML IV SOLN
INTRAVENOUS | Status: AC
  Filled 2020-10-30: qty 4

## 2020-10-30 NOTE — Progress Notes (Signed)
[facility] Progress Note   Subjective:   Patient seen and examined at bedside.  Reports increased pain overnight but mostly well controlled today.  Denies CP, SOB, n/v/d, abdominal pain, weakness, dizziness and fatigue.   Objective Vitals:   10/30/20 0200 10/30/20 0343 10/30/20 0414 10/30/20 0736  BP: (!) 149/94 (!) 158/94 (!) 162/92 (!) 157/81  Pulse: 88 84 91 84  Resp:    18  Temp:   97.6 F (36.4 C) 97.8 F (36.6 C)  TempSrc:   Oral   SpO2:   100% 98%  Weight:      Height:       Physical Exam General:chronically ill appearing male in NAD, laying in bed Heart:RRR, no mrg Lungs:CTAB, nml WOB on RA Abdomen:soft, NTND Extremities:no LE edema, R BKA in canister  Dialysis Access: [facility] c/d/i   Filed Weights   10/28/20 1538 10/29/20 0648  Weight: 111.1 kg 111.1 kg    Intake/Output Summary (Last 24 hours) at 10/30/2020 1141 Last data filed at 10/30/2020 [id number] Gross per 24 hour  Intake 776.34 ml  Output 1075 ml  Net -298.66 ml    Additional Objective Labs: Basic Metabolic Panel: Recent Labs  Lab 10/29/20 0816 10/29/20 1621 10/30/20 0412  NA 135 132* 133*  K 3.8 4.5 4.0  CL 98 97* 97*  CO2  --  25 23  GLUCOSE 138* 247* 277*  BUN 30* 35* 42*  CREATININE 3.80* 4.07* 4.58*  CALCIUM  --  8.4* 8.3*  PHOS  --  3.5  --    Liver Function Tests: Recent Labs  Lab 10/29/20 1621  ALBUMIN 2.1*    CBC: Recent Labs  Lab 10/29/20 0816 10/29/20 1621 10/30/20 0412  WBC  --  12.1* 14.6*  HGB 9.5* 8.6* 7.9*  HCT 28.0* 29.8* 27.1*  MCV  --  83.2 82.6  PLT  --  320 371   Blood Culture    Component Value Date/Time   SDES SACRAL 06/30/2020 0013   SPECREQUEST NONE 06/30/2020 0013   CULT MULTIPLE ORGANISMS PRESENT, NONE PREDOMINANT (A) 06/30/2020 0013   REPTSTATUS 07/02/2020 FINAL 06/30/2020 0013    CBG: Recent Labs  Lab 10/29/20 1317 10/29/20 1603 10/29/20 2121 10/29/20 2229 10/30/20 0623  GLUCAP 169* 244* 289* 298* 237*    Medications:   sodium chloride 75 mL/hr at 10/29/20 1514   sodium chloride     sodium chloride      amLODipine  5 mg Oral QHS   vitamin C  500 mg Oral Daily   Chlorhexidine Gluconate Cloth  6 each Topical Q0600   citalopram  20 mg Oral Daily   docusate sodium  100 mg Oral Daily   feeding supplement (NEPRO CARB STEADY)  237 mL Oral q AM   insulin aspart  0-9 Units Subcutaneous TID WC & HS   insulin glargine  11 Units Subcutaneous QHS   labetalol       lanthanum  500 mg Oral TID WC   melatonin  3 mg Oral QHS   midodrine  10 mg Oral Q T,Th,Sa-HD   multivitamin  1 tablet Oral QHS   pantoprazole  40 mg Oral Daily   traZODone  50 mg Oral QHS   zinc sulfate  220 mg Oral Daily    Dialysis Orders: TTS - [facility]  4hrs, BFR 400, DFR 500,  EDW 115.5kg, 3K/ 2.5Ca   Access: TDC  Heparin 3000 unit bolus Mircera 225 mcg q2wks - last 10/28/20   Assessment/Plan:  R  foot abscess/osteomyelitis - non healing with ABX/wound care. S/p R BKA with application of prevena wound vac on 6/11 by Dr. Sharol Given. PT recommending SNF.  No plan for CIR workup w/recommendations.     ESRD - on HD TTS. Plan for HD today per regular schedule.  Hold heparin d/t surgery. K 4.0.  Hypertension/volume  - Blood pressure elevated today, amlodipine started yesterday and held today due to plan for dialysis today. Not on antihypertensives as outpatient.  EDW recently lowered due to weight loss.  Does not appear grossly volume overloaded.  UF as tolerated.  Will need lower dry weight on d/c post amputation.   Anemia of CKD - Hgb drop 9.5>7.9 post surgery. ESA recently dosed.  Check iron studies.  Transfuse prn.   Secondary Hyperparathyroidism - Ca and phos in goal.  Not on VDRA or binders.  Nutrition - Renal diet w/fluid restrictions. Diabetes - per primary  Jen Mow, PA-C [facility] 10/30/2020,11:41 AM  LOS: 1 day

## 2020-10-30 NOTE — Progress Notes (Signed)
Patient ID: Fred [name], male   DOB: Aug 25, 1970, 50 y.o.   MRN: [id number] Patient is postoperative day 1 right transtibial amputation.  There is no drainage in the wound VAC canister there is one check.  Patient heading to dialysis today.  Discharge planning based on therapy recommendations.

## 2020-10-30 NOTE — Progress Notes (Signed)
Inpatient Rehab Admissions Coordinator:   CIR consult received. Pt. Is from [facility] and plan is to return following d/c. PT. Is also recommending SNF. I will not pursue CIR for this Pt.   Clemens Catholic, Kimberly, [place] Admissions Coordinator  [phone] ([place]) [phone] (office)

## 2020-10-30 NOTE — Progress Notes (Signed)
Inpatient Diabetes Program Recommendations  AACE/ADA: New Consensus Statement on Inpatient Glycemic Control   Target Ranges:  Prepandial:   less than 140 mg/dL      Peak postprandial:   less than 180 mg/dL (1-2 hours)      Critically ill patients:  140 - 180 mg/dL   Results for Fred [name], Fred [name] (MRN [id number]) as of 10/30/2020 08:08  Ref. Range 10/29/2020 08:46 10/29/2020 11:21 10/29/2020 13:17 10/29/2020 16:03 10/29/2020 21:21 10/29/2020 22:29 10/30/2020 06:23  Glucose-Capillary Latest Ref Range: 70 - 99 mg/dL 134 (H) 145 (H) 169 (H) 244 (H) 289 (H) 298 (H) 237 (H)   Review of Glycemic Control  Diabetes history: DM2 Outpatient Diabetes medications: Lantus 11 units QHS Current orders for Inpatient glycemic control: Lantus 11 units QHS, Novolog 0-9 units TID and HS   Inpatient Diabetes Program Recommendations:    Insulin: If glucose continues to be consistently over 180 mg/dl, please consider ordering Novolog 2 units TID with meals for meal coverage if patient eats at least 50% of meals.   NOTE: Noted consult for diabetes coordinator for DM control. Chart reviewed. Per chart, patient from SNF and has Lantus 11 units QHS listed as only outpatient DM medication. Noted patient had BKA surgery today and received Decadron 10 mg at 10:21 am on 10/29/20 which is contributing to hyperglycemia.  Thanks, Barnie Alderman, RN, MSN, CDE Diabetes Coordinator Inpatient Diabetes Program [phone] (Team Pager from 8am to 5pm)

## 2020-10-30 NOTE — Progress Notes (Signed)
Initial Nutrition Assessment  DOCUMENTATION CODES:   Not applicable  INTERVENTION:   Continue Nepro Shake po daily, each supplement provides 425 kcal and 19 grams protein. If po intake inadequate on follow-up and pt consuming, recommend increasing frequency   30 ml ProSource Plus BID, each supplement provides 100 kcals and 15 grams protein.   Add Juven BID for at least 2 weeks or until post op wound healed ,each packet provides 80 calories, 8 grams of carbohydrate, 2.5  grams of protein (collagen), 7 grams of L-arginine and 7 grams of L-glutamine; supplement contains CaHMB, Vitamins C, E, B12 and Zinc to promote wound healing  Continue Renal MVI daily  Vit C 500 mg ordered by provider; recommend 30 day stop date at present given patient is ESRD on HD and Vit C deficiency has not been determined. Recommend checking Vit C level  NUTRITION DIAGNOSIS:   Increased nutrient needs related to wound healing as evidenced by estimated needs.  GOAL:   Patient will meet greater than or equal to 90% of their needs  MONITOR:   PO intake, Supplement acceptance, Labs, Weight trends, Skin  REASON FOR ASSESSMENT:   Malnutrition Screening Tool    ASSESSMENT:   50 yo male admitted with osteomyelitis and abscess right foot requiring amputation. PMH includes ESRD on HD, DM, HTN, HLD, GERD, right foot ulcer  6/10 Transtibial amputation and application of wound VAC  Noted plan for d/c to SNF. Plan for iHD today No recorded po intake. Unable to reach pt via telephone.   Noted outpatient EDW has been lowered over the last few weeks due to suspected weight loss and increased edema. Current EDW 115.5 kg. Current wt 111.1 kg. Noted plan to lower dry weight on d/c secondary to amputation  Labs: sodium 133 (L), potassium 4.0 (wdl), phosphorus 3.5 (wdl), CBGs 169-298  Meds: KCl, zinc sulfate x 14 days, Vit C 500 mg daily, Renal MVI, Fosrenol with meals, lantus, ss novolog, colace    Diet Order:    Diet Order             Diet Carb Modified Fluid consistency: Thin; Room service appropriate? Yes  Diet effective now                   EDUCATION NEEDS:   Education needs have been addressed  Skin:  Skin Assessment: Skin Integrity Issues: Skin Integrity Issues:: Other (Comment), Wound VAC Wound Vac: new transitbial amputation Other: pressure injury to sacrum-no stage noted  Last BM:  6/10  Height:   Ht Readings from Last 1 Encounters:  10/29/20 '6\' 6"'$  (1.981 m)    Weight:   Wt Readings from Last 1 Encounters:  10/29/20 111.1 kg     BMI:  Body mass index is 28.3 kg/m.  Estimated Nutritional Needs:   Kcal:  2600-2800 kcals  Protein:  130-150 g  Fluid:  1000 mL plus UOP    Kerman Passey MS, RDN, LDN, CNSC Registered Dietitian III Clinical Nutrition RD Pager and On-Call Pager Number Located in [place]

## 2020-10-30 NOTE — Progress Notes (Signed)
OT Cancellation Note  Patient Details Name: Dayne Rayas MRN: [id number] DOB: 1971-02-11   Cancelled Treatment:    Reason Eval/Treat Not Completed: Patient at procedure or test/ unavailable (HD) Pt has HD TTHS will reattempt at later date/time  Jeri Modena 10/30/2020, 2:10 PM  Fleeta Emmer, OTR/L  Acute Rehabilitation Services Pager: [phone] Office: [phone] .

## 2020-10-31 LAB — BASIC METABOLIC PANEL
Anion gap: 7 (ref 5–15)
BUN: 22 mg/dL — ABNORMAL HIGH (ref 6–20)
CO2: 30 mmol/L (ref 22–32)
Calcium: 8.4 mg/dL — ABNORMAL LOW (ref 8.9–10.3)
Chloride: 98 mmol/L (ref 98–111)
Creatinine, Ser: 3.19 mg/dL — ABNORMAL HIGH (ref 0.61–1.24)
GFR, Estimated: 23 mL/min — ABNORMAL LOW (ref >60)
Glucose, Bld: 122 mg/dL — ABNORMAL HIGH (ref 70–99)
Potassium: 3.9 mmol/L (ref 3.5–5.1)
Sodium: 135 mmol/L (ref 135–145)

## 2020-10-31 LAB — CBC
HCT: 26.7 % — ABNORMAL LOW (ref 39.0–52.0)
Hemoglobin: 7.7 g/dL — ABNORMAL LOW (ref 13.0–17.0)
MCH: 24 pg — ABNORMAL LOW (ref 26.0–34.0)
MCHC: 28.8 g/dL — ABNORMAL LOW (ref 30.0–36.0)
MCV: 83.2 fL (ref 80.0–100.0)
Platelets: 375 10*3/uL (ref 150–400)
RBC: 3.21 MIL/uL — ABNORMAL LOW (ref 4.22–5.81)
RDW: 17.2 % — ABNORMAL HIGH (ref 11.5–15.5)
WBC: 11.8 10*3/uL — ABNORMAL HIGH (ref 4.0–10.5)
nRBC: 0 % (ref 0.0–0.2)

## 2020-10-31 LAB — GLUCOSE, CAPILLARY
Glucose-Capillary: 105 mg/dL — ABNORMAL HIGH (ref 70–99)
Glucose-Capillary: 115 mg/dL — ABNORMAL HIGH (ref 70–99)
Glucose-Capillary: 125 mg/dL — ABNORMAL HIGH (ref 70–99)
Glucose-Capillary: 150 mg/dL — ABNORMAL HIGH (ref 70–99)

## 2020-10-31 LAB — HEPATITIS B SURFACE ANTIGEN: Hepatitis B Surface Ag: NONREACTIVE

## 2020-10-31 NOTE — Progress Notes (Signed)
Subjective: 2 Days Post-Op Procedure(s) (LRB): RIGHT BELOW KNEE AMPUTATION (Right) Patient reports pain as mild.    Objective: Vital signs in last 24 hours: Temp:  [97.8 F (36.6 C)-99.6 F (37.6 C)] 98 F (36.7 C) (06/11 2141) Pulse Rate:  [74-84] 82 (06/11 2141) Resp:  [16-18] 16 (06/11 2141) BP: (122-189)/(77-105) 139/103 (06/11 2141) SpO2:  [99 %-100 %] 100 % (06/11 2141) Weight:  [118.8 kg] 118.8 kg (06/11 1340)  Intake/Output from previous day: No intake/output data recorded. Intake/Output this shift: No intake/output data recorded.  Recent Labs    10/29/20 0816 10/29/20 1621 10/30/20 0412 10/31/20 0340  HGB 9.5* 8.6* 7.9* 7.7*   Recent Labs    10/30/20 0412 10/31/20 0340  WBC 14.6* 11.8*  RBC 3.28* 3.21*  HCT 27.1* 26.7*  PLT 371 375   Recent Labs    10/30/20 0412 10/31/20 0340  NA 133* 135  K 4.0 3.9  CL 97* 98  CO2 23 30  BUN 42* 22*  CREATININE 4.58* 3.19*  GLUCOSE 277* 122*  CALCIUM 8.3* 8.4*   No results for input(s): LABPT, INR in the last 72 hours.  Neurologically intact Neurovascular intact Stump shrinker in place Wound vac in place without fluid in canister  Assessment/Plan: 2 Days Post-Op Procedure(s) (LRB): RIGHT BELOW KNEE AMPUTATION (Right) Discharge to SNF probably early next week.  Will need to coordinate with dialysis timing NWB RLE Continue stump shrinker      Aundra Dubin 10/31/2020, 8:34 AM

## 2020-10-31 NOTE — Plan of Care (Signed)

## 2020-10-31 NOTE — NC FL2 (Signed)
[place] LEVEL OF CARE SCREENING TOOL     IDENTIFICATION  Patient Name: Fred [name] Birthdate: 05/25/1970 Sex: male Admission Date (Current Location): 10/29/2020  [place] and [place] Number:  Kathleen Argue [id number] [place] and Address:  [facility] [place] [place] [address] [place], [place] [address]      Provider Number: [id number]  Attending Physician Name and Address:  Newt Minion, MD  Relative Name and Phone Number:  Berton Lan   [phone]    Current Level of Care: Hospital Recommended Level of Care: [place] Prior Approval Number:    Date Approved/Denied:   PASRR Number: [id number]  Discharge Plan: SNF    Current Diagnoses: Patient Active Problem List   Diagnosis Date Noted   Abscess of bursa of right ankle 10/29/2020   Subacute osteomyelitis, right ankle and foot (HCC)    Diarrhea    History of Clostridioides difficile colitis    Fever    Anemia of chronic disease    ESRD on dialysis [facility])    Diabetic peripheral neuropathy ([place])    Labile blood glucose    Essential hypertension    Constipation    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   Avascular necrosis of first metatarsal, left foot ([place]) 04/30/2020   Osteomyelitis of dorsal first metatarsal, left foot ([place]) 04/30/2020   Pressure injury of sacral region, unstageable ([place]) 04/30/2020   Morbid obesity ([place]) 04/30/2020   Multiple open wounds of foot    Insulin dependent type 2 diabetes mellitus ([place]) 04/29/2020   HTN (hypertension) 04/29/2020   History of anemia due to chronic kidney disease 04/29/2020   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   Pressure injury of both heels, unstageable ([place]) 04/29/2020   Pneumonia due to COVID-19 virus 04/29/2020   ESRD (end stage renal disease) ([place]) 04/29/2020    Orientation RESPIRATION BLADDER Height & Weight     Self, Time, Situation, Place  Normal Continent Weight: 261 lb 14.5 oz (118.8 kg) Height:   '6\' 6"'$  (198.1 cm)  BEHAVIORAL SYMPTOMS/MOOD NEUROLOGICAL BOWEL NUTRITION STATUS      Continent Diet (see discharge summary)  AMBULATORY STATUS COMMUNICATION OF NEEDS Skin   Total Care Verbally Surgical wounds                       Personal Care Assistance Level of Assistance  Bathing, Feeding, Dressing Bathing Assistance: Limited assistance Feeding assistance: Independent Dressing Assistance: Limited assistance     Functional Limitations Info  Sight, Hearing, Speech Sight Info: Adequate Hearing Info: Adequate Speech Info: Adequate    SPECIAL CARE FACTORS FREQUENCY  PT (By licensed PT), OT (By licensed OT)     PT Frequency: 5x week OT Frequency: 5x week            Contractures Contractures Info: Not present    Additional Factors Info  Code Status, Allergies, Insulin Sliding Scale Code Status Info: full Allergies Info: Atorvastatin, Trazodone Hcl   Insulin Sliding Scale Info: Novolog, 0-9 units 3x day with meals and bedtime.  See discharge summary.       Current Medications (10/31/2020):  This is the current hospital active medication list Current Facility-Administered Medications  Medication Dose Route Frequency Provider Last Rate Last Admin   (feeding supplement) PROSource Plus liquid 30 mL  30 mL Oral BID BM Newt Minion, MD   30 mL at 10/31/20 0817   0.9 %  sodium chloride infusion   Intravenous Continuous Persons, Bevely Palmer, PA 75 mL/hr  at 10/29/20 1514 New Bag at 10/29/20 1514   acetaminophen (TYLENOL) tablet 325-650 mg  325-650 mg Oral Q6H PRN Persons, Bevely Palmer, PA       albuterol (VENTOLIN HFA) 108 (90 Base) MCG/ACT inhaler 1-2 puff  1-2 puff Inhalation Q4H PRN Persons, Bevely Palmer, PA       alum & mag hydroxide-simeth (MAALOX/MYLANTA) 200-200-20 MG/5ML suspension 15-30 mL  15-30 mL Oral Q2H PRN Persons, Bevely Palmer, PA       amLODipine (NORVASC) tablet 5 mg  5 mg Oral QHS Persons, Bevely Palmer, [place]   5 mg at 10/30/20 2230   ascorbic acid (VITAMIN C) tablet 500  mg  500 mg Oral Daily Newt Minion, MD   500 mg at 10/31/20 0818   bisacodyl (DULCOLAX) EC tablet 5 mg  5 mg Oral Daily PRN Persons, Bevely Palmer, PA       Chlorhexidine Gluconate Cloth 2 % PADS 6 each  6 each Topical Q0600 Penninger, Ria Comment, [place]   6 each at 10/30/20 [id number]   citalopram (CELEXA) tablet 20 mg  20 mg Oral Daily Persons, Bevely Palmer, PA   20 mg at 10/31/20 0818   docusate sodium (COLACE) capsule 100 mg  100 mg Oral Daily Persons, Bevely Palmer, PA   100 mg at 10/31/20 0818   feeding supplement (NEPRO CARB STEADY) liquid 237 mL  237 mL Oral q AM Persons, Bevely Palmer, PA   237 mL at 10/31/20 0818   guaiFENesin-dextromethorphan (ROBITUSSIN DM) 100-10 MG/5ML syrup 15 mL  15 mL Oral Q4H PRN Persons, Bevely Palmer, PA       HYDROmorphone (DILAUDID) injection 0.5 mg  0.5 mg Intravenous Q4H PRN Persons, Bevely Palmer, PA   0.5 mg at 10/30/20 2226   insulin aspart (novoLOG) injection 0-9 Units  0-9 Units Subcutaneous TID WC & HS Aundra Dubin, PA-C   1 Units at 10/30/20 2229   insulin glargine (LANTUS) injection 11 Units  11 Units Subcutaneous QHS Persons, Bevely Palmer, [place]   11 Units at 10/30/20 2230   lanthanum (FOSRENOL) chewable tablet 500 mg  500 mg Oral TID WC Persons, Bevely Palmer, PA   500 mg at 10/31/20 0818   magnesium citrate solution 1 Bottle  1 Bottle Oral Once PRN Persons, Bevely Palmer, PA       melatonin tablet 3 mg  3 mg Oral QHS Persons, Bevely Palmer, PA   3 mg at 10/30/20 2230   midodrine (PROAMATINE) tablet 10 mg  10 mg Oral Q T,Th,Sa-HD Persons, Bevely Palmer, PA   10 mg at 10/30/20 1210   multivitamin (RENA-VIT) tablet 1 tablet  1 tablet Oral QHS Persons, Bevely Palmer, [place]   1 tablet at 10/30/20 2230   nutrition supplement (JUVEN) (JUVEN) powder packet 1 packet  1 packet Oral BID BM Newt Minion, MD   1 packet at 10/31/20 0818   ondansetron (ZOFRAN) injection 4 mg  4 mg Intravenous Q6H PRN Persons, Bevely Palmer, PA       oxyCODONE (Oxy IR/ROXICODONE) immediate release tablet 5-10 mg  5-10 mg Oral Q4H PRN  Persons, Bevely Palmer, PA   10 mg at 10/31/20 0818   pantoprazole (PROTONIX) EC tablet 40 mg  40 mg Oral Daily Persons, Bevely Palmer, PA   40 mg at 10/31/20 0818   phenol (CHLORASEPTIC) mouth spray 1 spray  1 spray Mouth/Throat PRN Persons, Bevely Palmer, PA       polyethylene glycol (MIRALAX / GLYCOLAX) packet 17 g  17 g Oral  Daily PRN Persons, Bevely Palmer, [place]       potassium chloride SA (KLOR-CON) CR tablet 20-40 mEq  20-40 mEq Oral Daily PRN Persons, Bevely Palmer, PA       traZODone (DESYREL) tablet 50 mg  50 mg Oral QHS Persons, Bevely Palmer, [place]   50 mg at 10/30/20 2230   zinc sulfate capsule 220 mg  220 mg Oral Daily Persons, Bevely Palmer, [place]   220 mg at 10/31/20 0818     Discharge Medications: Please see discharge summary for a list of discharge medications.  Relevant Imaging Results:  Relevant Lab Results:   Additional Information SSN: [id number].  Pt has had one covid vaccine shot.  Joanne Chars, LCSW

## 2020-10-31 NOTE — TOC Initial Note (Signed)
Transition of Care [facility]) - Initial/Assessment Note    [name] MRN: [id number] Date of Birth: 11/01/1970  Transition of [facility]) CM/SW Contact:    Fred Chars, LCSW Phone Number: 10/31/2020, 11:38 AM  Clinical Narrative:  CSW met with pt regarding recommendation for SNF.  Pt reports he is long term care resident at [facility] and does want to return there.  Discussed recommendation that he engage in rehab possibly this time vs LTC and pt does want to work with PT.  Permission given to speak with sister Fred [name] and father Fred [name].  Pt reports he has received on dose of covid vaccine, willing to get additional.  Pt currently uses both wheelchair and hoyer lift at [name].                   Expected Discharge Plan: Skilled Nursing Facility Barriers to Discharge: Continued Medical Work up   Patient Goals and CMS Choice Patient states their goals for this hospitalization and ongoing recovery are:: "be able to walk" CMS Medicare.gov Compare Post Acute Care list provided to::  (NA-pt wants to return to [name]) Choice offered to / list presented to : NA  Expected Discharge Plan and Services Expected Discharge Plan: [place] In-house Referral: Clinical Social Work   Post Acute Care Choice: [place] Living arrangements for the past 2 months: [place]                                      Prior Living Arrangements/Services Living arrangements for the past 2 months: [place] Lives with:: Facility Resident Patient language and need for interpreter reviewed:: Yes Do you feel safe going back to the place where you live?: Yes      Need for Family Participation in Patient Care: No (Comment)   Current home services: Other (comment) (NA) Criminal Activity/Legal Involvement Pertinent to Current Situation/Hospitalization: No - Comment as needed  Activities of Daily Living Home  Assistive Devices/Equipment: Wheelchair ADL Screening (condition at time of admission) Patient's cognitive ability adequate to safely complete daily activities?: Yes Is the patient deaf or have difficulty hearing?: No Does the patient have difficulty seeing, even when wearing glasses/contacts?: Yes (right eye difficulty seeing) Does the patient have difficulty concentrating, remembering, or making decisions?: No Patient able to express need for assistance with ADLs?: Yes Does the patient have difficulty dressing or bathing?: Yes Independently performs ADLs?: No Communication: Independent Dressing (OT): Needs assistance Is this a change from baseline?: Pre-admission baseline Grooming: Independent Feeding: Independent Bathing: Needs assistance Is this a change from baseline?: Pre-admission baseline Toileting: Needs assistance Is this a change from baseline?: Pre-admission baseline In/Out Bed: Needs assistance Is this a change from baseline?: Pre-admission baseline Walks in Home: Needs assistance Is this a change from baseline?: Change from baseline, expected to last >3 days Does the patient have difficulty walking or climbing stairs?: Yes Weakness of Legs: Both Weakness of Arms/Hands: Both  Permission Sought/Granted Permission sought to share information with : Family Supports, Chartered certified accountant granted to share information with : Yes, Verbal Permission Granted  Share Information with NAME: sister Fred [name], father Fred [name]  Permission granted to share info w AGENCY: [name]        Emotional Assessment Appearance:: Appears stated age Attitude/Demeanor/Rapport: Engaged Affect (typically observed): Appropriate, Pleasant Orientation: : Oriented to Self, Oriented to Place, Oriented to  Time, Oriented to Situation Alcohol / Substance Use: Not Applicable Psych Involvement: No (comment)  Admission diagnosis:  Abscess of bursa of right ankle [M71.071] Patient  Active Problem List   Diagnosis Date Noted   Abscess of bursa of right ankle 10/29/2020   Subacute osteomyelitis, right ankle and foot (HCC)    Diarrhea    History of Clostridioides difficile colitis    Fever    Anemia of chronic disease    ESRD on dialysis 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   Avascular necrosis of first metatarsal, left foot ([place]) 04/30/2020   Osteomyelitis of dorsal first metatarsal, left foot ([place]) 04/30/2020   Pressure injury of sacral region, unstageable ([place]) 04/30/2020   Morbid obesity ([place]) 04/30/2020   Multiple open wounds of foot    Insulin dependent type 2 diabetes mellitus ([place]) 04/29/2020   HTN (hypertension) 04/29/2020   History of anemia due to chronic kidney disease 04/29/2020   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

## 2020-10-31 NOTE — Progress Notes (Addendum)
Fred [name] Progress Note   Subjective:   Patient seen and examined at bedside.  Increased pain in stump this morning but reports due to pain meds.  Nursing aware. Reports vomiting x2 yesterday.  Once prior to dialysis after transport which he attributes to motion sickness.  And again after returning to room and "eating too fast."  Denies n/v this am.  No CP, SOB, abdominal pain or fatigue. Tolerated dialysis well without dizziness, cramping or nausea.   Objective Vitals:   10/30/20 1730 10/30/20 1853 10/30/20 1854 10/30/20 2141  BP: (!) 163/96 (!) 151/95 (!) 151/95 (!) 139/103  Pulse: 77 84 84 82  Resp: '16 16 16 16  '$ Temp:  97.8 F (36.6 C) 98 F (36.7 C) 98 F (36.7 C)  TempSrc:  Oral  Oral  SpO2:  99% 99% 100%  Weight:      Height:       Physical Exam General:chronically ill appearing male in NAD Heart:RRR, no mrg Lungs:CTAB, nml WOB on RA Abdomen:soft, NTND Extremities:no LE edema, R BKA with shrinker in place Dialysis Access: [facility] c/d/i   Filed Weights   10/28/20 1538 10/29/20 0648 10/30/20 1340  Weight: 111.1 kg 111.1 kg 118.8 kg    Intake/Output Summary (Last 24 hours) at 10/31/2020 0836 Last data filed at 10/31/2020 0323 Gross per 24 hour  Intake --  Output 0 ml  Net 0 ml    Additional Objective Labs: Basic Metabolic Panel: Recent Labs  Lab 10/29/20 1621 10/30/20 0412 10/31/20 0340  NA 132* 133* 135  K 4.5 4.0 3.9  CL 97* 97* 98  CO2 '25 23 30  '$ GLUCOSE 247* 277* 122*  BUN 35* 42* 22*  CREATININE 4.07* 4.58* 3.19*  CALCIUM 8.4* 8.3* 8.4*  PHOS 3.5  --   --    Liver Function Tests: Recent Labs  Lab 10/29/20 1621  ALBUMIN 2.1*    CBC: Recent Labs  Lab 10/29/20 1621 10/30/20 0412 10/31/20 0340  WBC 12.1* 14.6* 11.8*  HGB 8.6* 7.9* 7.7*  HCT 29.8* 27.1* 26.7*  MCV 83.2 82.6 83.2  PLT 320 371 375    CBG: Recent Labs  Lab 10/29/20 2229 10/30/20 0623 10/30/20 1152 10/30/20 1930 10/31/20 0637  GLUCAP 298* 237* 232* 137*  105*   Iron Studies:  Recent Labs    10/30/20 1212  IRON 43*  TIBC 115*     Medications:  sodium chloride 75 mL/hr at 10/29/20 1514    (feeding supplement) PROSource Plus  30 mL Oral BID BM   amLODipine  5 mg Oral QHS   vitamin C  500 mg Oral Daily   Chlorhexidine Gluconate Cloth  6 each Topical Q0600   citalopram  20 mg Oral Daily   docusate sodium  100 mg Oral Daily   feeding supplement (NEPRO CARB STEADY)  237 mL Oral q AM   insulin aspart  0-9 Units Subcutaneous TID WC & HS   insulin glargine  11 Units Subcutaneous QHS   lanthanum  500 mg Oral TID WC   melatonin  3 mg Oral QHS   midodrine  10 mg Oral Q T,Th,Sa-HD   multivitamin  1 tablet Oral QHS   nutrition supplement (JUVEN)  1 packet Oral BID BM   pantoprazole  40 mg Oral Daily   traZODone  50 mg Oral QHS   zinc sulfate  220 mg Oral Daily    Dialysis Orders: TTS - [facility]  4hrs, BFR 400, DFR 500,  EDW 115.5kg, 3K/ 2.5Ca  Access: TDC  Heparin 3000 unit bolus Mircera 225 mcg q2wks - last 10/28/20   Assessment/Plan:  R foot abscess/osteomyelitis - non healing with ABX/wound care. S/p R BKA with application of prevena wound vac on 6/11 by Dr. Sharol Given. PT recommending SNF.    ESRD - on HD TTS. HD tolerated well.  Next HD 11/02/20. Hold heparin d/t surgery. K 4.0.  Hypertension/volume  - BP remains elevated.  Increase amlodipine to '10mg'$  qd.  Not on antihypertensives as outpatient.  EDW recently lowered due to weight loss.  Does not appear grossly volume overloaded.  UF as tolerated.  Will need lower dry weight on d/c post amputation.   Anemia of CKD - Hgb drop 9.5>7.9>7.7 post surgery. Tsat 37%, no indication for IV iron.  ESA recently dosed. Transfuse prn.  Secondary Hyperparathyroidism - Ca and phos in goal.  Not on VDRA or binders.  Nutrition - On carb modified diet w/fluid restrictions.  K and phos ok. Monitor labs.   Diabetes - per primary  Fred Mow, PA-C [name] 10/31/2020,8:36 AM  LOS:  2 days

## 2020-10-31 NOTE — Progress Notes (Signed)
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 PMH includes: DM II, HTN, ESRD on HD, anemia, GERD,  cholecystectomy 03/23/20, obesity, and COVID (1/22).   Clinical Impression   Fred [name] was evaluated s/p the above impairments. PTA he was as a SNF and received assistance for lower body ADLs at bed level and was being transferred dependently OOB via hoyer for the past 2 months. Upon evaluation pt was supervision for rolling, and min A to sit EOB. He requies min A for upper body ADLs and max/total A fro all lower body Adls at bed level. Pt would benefit from continued OT to progress functional mobility and ADLs OOB. Recommend d/c back to SNF.    Follow Up Recommendations  SNF;Supervision/Assistance - 24 hour    Equipment Recommendations  None recommended by OT       Precautions / Restrictions Precautions Precautions: Fall Precaution Comments: IV sacral ulcer Required Braces or Orthoses: Other Brace Other Brace: limb protector Restrictions Weight Bearing Restrictions: Yes RLE Weight Bearing: Non weight bearing      Mobility Bed Mobility Overal bed mobility: Needs Assistance Bed Mobility: Rolling;Sidelying to Sit;Sit to Sidelying Rolling: Supervision Sidelying to sit: Min assist     Sit to sidelying: Min assist General bed mobility comments: supervision for rolling (with use of bed rail), minA to complete transition to sitting (elevation of trunk), pt able to move himself and reposition hips    Transfers Overall transfer level: Needs assistance Equipment used: None             General transfer comment: deferred this session; pt at hoyer level at basline however would liek to progress to stand pivoting    Balance Overall balance assessment: Needs assistance Sitting-balance  support: No upper extremity supported;Feet supported Sitting balance-Leahy Scale: Fair             ADL either performed or assessed with clinical judgement   ADL Overall ADL's : Needs assistance/impaired Eating/Feeding: Independent;Sitting   Grooming: Wash/dry hands;Wash/dry face;Oral care;Applying deodorant;Brushing hair;Set up;Sitting;Bed level   Upper Body Bathing: Minimal assistance;Sitting;Bed level   Lower Body Bathing: Maximal assistance;Bed level   Upper Body Dressing : Minimal assistance;Sitting;Bed level   Lower Body Dressing: Maximal assistance;Bed level   Toilet Transfer: Total assistance   Toileting- Clothing Manipulation and Hygiene: Maximal assistance       Functional mobility during ADLs: Total assistance (Total A for OOB; min A for bed mobility) General ADL Comments: pt does well with rolling and sitting to assist with ADLs      Pertinent Vitals/Pain Pain Assessment: Faces Faces Pain Scale: Hurts a little bit Pain Location: R BKA site Pain Descriptors / Indicators: Discomfort;Grimacing;Sore;Sharp Pain Intervention(s): Limited activity within patient's tolerance;Monitored during session     Hand Dominance Right   Extremity/Trunk Assessment Upper Extremity Assessment Upper Extremity Assessment: Overall WFL for tasks assessed   Lower Extremity Assessment Lower Extremity Assessment: Defer to PT evaluation   Cervical / Trunk Assessment Cervical / Trunk Assessment: Other exceptions (increased body habitus & sacral wound)   Communication Communication Communication: No difficulties   Cognition Arousal/Alertness: Awake/alert Behavior During Therapy: WFL for tasks assessed/performed;Flat affect Overall Cognitive Status: Within Functional Limits for tasks assessed             General Comments: pt reports he is bored  General Comments  no new concerns noted; pt would like for wound docs to assess his sacral wound and L ankle adn foot.      Home Living Family/patient expects to be discharged to:: Skilled nursing facility         Additional Comments: pt living at [facility], has been using hoyer for 2 months to avoid pressure on feet to allow for wound healing      Prior Functioning/Environment Level of Independence: Needs assistance  Gait / Transfers Assistance Needed: pt reports 2 months of lift to avoid pressure on bilateral feet wounds ADL's / Homemaking Assistance Needed: Staff assisting with bathing, dressing LB, he manages upperbody. He is able to feed himself and brush his teeth per pt report   Comments: Pt wishes to begin OOB ttransfers again however is nervous about how weak he has gotten and LLE wounds        OT Problem List: Decreased strength;Decreased range of motion;Decreased activity tolerance;Impaired balance (sitting and/or standing);Decreased safety awareness;Decreased knowledge of use of DME or AE;Decreased knowledge of precautions;Pain      OT Treatment/Interventions: Self-care/ADL training;Therapeutic exercise;DME and/or AE instruction;Therapeutic activities;Balance training;Patient/family education    OT Goals(Current goals can be found in the care plan section) Acute Rehab OT Goals Patient Stated Goal: to get stronger OT Goal Formulation: With patient Time For Goal Achievement: 11/14/20 Potential to Achieve Goals: Fair ADL Goals Pt Will Perform Lower Body Bathing: sitting/lateral leans;with min guard assist Pt Will Perform Upper Body Dressing: with set-up;sitting Pt Will Perform Lower Body Dressing: with set-up;sitting/lateral leans Pt Will Transfer to Toilet: stand pivot transfer;with mod assist;bedside commode Pt Will Perform Toileting - Clothing Manipulation and hygiene: with min guard assist;sitting/lateral leans  OT Frequency: Min 2X/week    AM-PAC OT "6 Clicks" Daily Activity     Outcome Measure Help from another person eating meals?: None Help from another person taking care of  personal grooming?: A Little Help from another person toileting, which includes using toliet, bedpan, or urinal?: A Lot Help from another person bathing (including washing, rinsing, drying)?: A Lot Help from another person to put on and taking off regular upper body clothing?: A Little Help from another person to put on and taking off regular lower body clothing?: A Lot 6 Click Score: 16   End of Session    Activity Tolerance:   Patient left:    OT Visit Diagnosis: Other abnormalities of gait and mobility (R26.89);Pain Pain - Right/Left: Right Pain - part of body: Leg                Time: 1211-1226 OT Time Calculation (min): 15 min Charges:  OT General Charges $OT Visit: 1 Visit OT Evaluation $OT Eval Moderate Complexity: 1 Mod   Fred [name] 10/31/2020, 1:30 PM

## 2020-10-31 NOTE — Plan of Care (Signed)
  Problem: Pain Managment: Goal: General experience of comfort will improve Outcome: Progressing   Problem: Safety: Goal: Ability to remain free from injury will improve Outcome: Progressing   Problem: Skin Integrity: Goal: Risk for impaired skin integrity will decrease Outcome: Progressing   

## 2020-11-01 DIAGNOSIS — I10 Essential (primary) hypertension: Secondary | ICD-10-CM

## 2020-11-01 LAB — SURGICAL PATHOLOGY

## 2020-11-01 LAB — GLUCOSE, CAPILLARY
Glucose-Capillary: 113 mg/dL — ABNORMAL HIGH (ref 70–99)
Glucose-Capillary: 122 mg/dL — ABNORMAL HIGH (ref 70–99)
Glucose-Capillary: 111 mg/dL — ABNORMAL HIGH (ref 70–99)
Glucose-Capillary: 95 mg/dL (ref 70–99)

## 2020-11-01 LAB — SARS CORONAVIRUS 2 (TAT 6-24 HRS): SARS Coronavirus 2: NEGATIVE

## 2020-11-01 MED ORDER — OXYCODONE-ACETAMINOPHEN 5-325 MG PO TABS: 1.0000 | ORAL_TABLET | ORAL | 0 refills | Status: DC | PRN

## 2020-11-01 MED ORDER — OXYCODONE HCL 5 MG PO TABS: 5.0000 mg | ORAL_TABLET | ORAL | 0 refills | Status: DC | PRN

## 2020-11-01 MED ORDER — HYDRALAZINE HCL 20 MG/ML IJ SOLN
10.0000 mg | INTRAMUSCULAR | Status: DC | PRN
  Administered 2020-11-02 – 2020-11-03 (×3): 10 mg via INTRAVENOUS
  Filled 2020-11-01 (×3): qty 1

## 2020-11-01 MED ORDER — HYDRALAZINE HCL 10 MG PO TABS
10.0000 mg | ORAL_TABLET | Freq: Once | ORAL | Status: AC
  Administered 2020-11-01: 10 mg via ORAL
  Filled 2020-11-01: qty 1

## 2020-11-01 MED ORDER — AMLODIPINE BESYLATE 5 MG PO TABS
5.0000 mg | ORAL_TABLET | Freq: Once | ORAL | Status: AC
  Administered 2020-11-01: 5 mg via ORAL
  Filled 2020-11-01: qty 1

## 2020-11-01 NOTE — Care Management Important Message (Signed)
Important Message  [name] MRN: [id number] Date of Birth: 12/09/1970   Medicare Important Message Given:  Yes - Important Message mailed due to current National Emergency   Verbal consent obtained due to current National Emergency  Relationship to patient: Self Contact Name: Kenshawn Call Date: 11/01/20  Time: 1433 Phone: [phone] Outcome: No Answer/Busy Important Message mailed to: Patient address on file   Delorse Lek 11/01/2020, 2:34 PM

## 2020-11-01 NOTE — Progress Notes (Signed)
Tried calling report to RN at [facility], but was informed she was busy with a resident at the moment. I gave secretary my number so she can give me a call back when she is ready.

## 2020-11-01 NOTE — Consult Note (Signed)
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 Left lateral malleolus Pressure Injury POA: Yes Measurement: 1 x 1 x 3 Wound bed: maceration and epibole around the edges of the wound, with 3 cm depth into the wound Drainage (amount, consistency, odor) Sanguinous on the dressing.  Dressing procedure/placement/frequency: Clean the sacral wound with NS, pat dry. Place a piece of Aquacel Advantage Kellie Simmering # [id number]) in the wound and another piece outside the wound. Cover with 4 x 4 and foam dressing. Change daily. Use NS to loosen the Aquacel if it sticks.   Monitor the wound area(s) for worsening of condition such as: Signs/symptoms of infection, increase in size, development of or worsening of odor, development of pain, or increased pain at the affected locations.   Notify the medical team if any of these develop.  Thank you for the consult. [place] nurse will not follow at this time.   Please re-consult the [facility] team if needed.  Cathlean Marseilles Tamala Julian, MSN, RN, [place], [place]le [place], [facility] Wound Treatment Associate Pager [phone]

## 2020-11-01 NOTE — Consult Note (Signed)
[facility] Medical Consultation  Fred [name] [id number] DOB: 06/03/1970 DOA: 10/29/2020 PCP: Merryl Hacker, No   Requesting physician: Dr. Sharol Given. Date of consultation: November 01, 2020. Reason for consultation: High blood pressure.  Impression/Recommendations Active Problems:   Subacute osteomyelitis, right ankle and foot (HCC)   Abscess of bursa of right ankle    Uncontrolled hypertension -we will continue amlodipine and keep patient on as needed IV hydralazine for now.  Will follow blood pressure after dialysis in the morning.  Patient has history of orthostatic hypotension also.  For which patient takes midodrine. Diabetes mellitus type 2 on Lantus follow CBGs closely with sliding scale coverage. Anemia likely from renal disease also could be postoperative blood loss.  Follow CBC. Status post right below-knee amputation for abscess/osteomyelitis. ESRD on hemodialysis due for dialysis tomorrow.  Does not look fluid overloaded.  I will followup again tomorrow. Please contact me if I can be of assistance in the meanwhile. Thank you for this consultation.  Chief Complaint: Right leg infection.  HPI:  History of ESRD on hemodialysis, hypertension, diabetes mellitus was admitted for right lower extremity below-knee amputation for chronic infection/osteomyelitis/abscess.  Was found to have elevated blood pressure at the time of discharge and medical consult was requested.  Patient at the time of exam did not have any chest pain or shortness of breath.  Review of Systems:  As present in the history of presenting illness nothing else significant.  Past Medical History:  Diagnosis Date   Anemia    takes iron pills   Chronic kidney disease    HD MWF   Diabetes mellitus without complication ([facility])    type 2   Dyspnea    SOB - uses inhaler   GERD (gastroesophageal reflux disease)    Hypertension    Past Surgical History:  Procedure Laterality Date   AMPUTATION Right 10/29/2020    Procedure: RIGHT BELOW KNEE AMPUTATION;  Surgeon: Newt Minion, MD;  Location: [facility];  Service: Orthopedics;  Laterality: Right;   CHOLECYSTECTOMY     Social History:  reports that he has never smoked. He has never used smokeless tobacco. He reports that he does not drink alcohol and does not use drugs.  Allergies  Allergen Reactions   Atorvastatin Hives    NOT on MAR   Trazodone Hcl Other (See Comments)    Confusion (intolerance)   History reviewed. No pertinent family history.  Prior to Admission medications   Medication Sig Start Date End Date Taking? Authorizing Provider  acetaminophen (TYLENOL) 325 MG tablet Take 650 mg by mouth every 6 (six) hours as needed for mild pain or fever (>101 F).   Yes [provider]  albuterol (VENTOLIN HFA) 108 (90 Base) MCG/ACT inhaler Inhale 1-2 puffs into the lungs every 4 (four) hours as needed for wheezing or shortness of breath. 07/15/20  Yes Angiulli, Lavon Paganini, PA-C  Amino Acids-Protein Hydrolys (PRO-STAT SUGAR FREE PO) Take 30 mLs by mouth in the morning and at bedtime.   Yes [provider]  amLODipine (NORVASC) 5 MG tablet Take 1 tablet (5 mg total) by mouth at bedtime. Patient taking differently: Take 5 mg by mouth See admin instructions. Take 1 tablet (5 mg) by mouth on morning of dialysis Tuesdays, Thursdays & Saturdays. Hold if SBP<120 07/15/20  Yes Angiulli, Lavon Paganini, PA-C  Cholecalciferol (VITAMIN D3) 50 MCG (2000 UT) TABS Take 2,000 Units by mouth daily.   Yes [provider]  citalopram (CELEXA) 20 MG tablet Take 1 tablet (  20 mg total) by mouth daily. Patient taking differently: Take 20 mg by mouth in the morning. 07/16/20  Yes Angiulli, Lavon Paganini, PA-C  ferrous sulfate 325 (65 FE) MG tablet Take 325 mg by mouth in the morning.   Yes [provider]  insulin glargine (LANTUS) 100 UNIT/ML injection Inject 0.11 mLs (11 Units total) into the skin at bedtime. 07/15/20  Yes Angiulli, Lavon Paganini, PA-C  lanthanum  (FOSRENOL) 500 MG chewable tablet Chew 500 mg by mouth 3 (three) times daily with meals.   Yes [provider]  melatonin 3 MG TABS tablet Take 3 mg by mouth at bedtime. (1900)   Yes [provider]  metroNIDAZOLE (FLAGYL) 500 MG tablet Take 500 mg by mouth See admin instructions. Cleanse right heel with normal saline first, then take 1 tablet (500 mg) crush tablet and place in wound bed, pack with dakins soaked gauze and cover with dry dressing.   Yes [provider]  midodrine (PROAMATINE) 10 MG tablet Take 10 mg by mouth See admin instructions. Take 1 tablet (10 mg) by mouth before dialysis on Tuesdays, Thursdays & Saturdays   Yes [provider]  multivitamin (RENA-VIT) TABS tablet Take 1 tablet by mouth at bedtime.   Yes [provider]  Nutritional Supplements (NEPRO/CARBSTEADY PO) Take 237 mLs by mouth in the morning.   Yes [provider]  oxyCODONE-acetaminophen (PERCOCET) 5-325 MG tablet Take 1 tablet by mouth every 4 (four) hours as needed. 11/01/20 11/01/21 Yes Persons, Bevely Palmer, PA  pantoprazole (PROTONIX) 40 MG tablet Take 1 tablet (40 mg total) by mouth daily at 6 (six) AM. 07/16/20  Yes Angiulli, Lavon Paganini, PA-C  traZODone (DESYREL) 50 MG tablet Take 50 mg by mouth at bedtime.   Yes [provider]  vitamin C (ASCORBIC ACID) 500 MG tablet Take 500 mg by mouth in the morning.   Yes [provider]  cadexomer iodine (IODOSORB) 0.9 % gel Apply 1 application topically every other day. Applied to left 4th toe    [provider]  collagenase (SANTYL) ointment Apply topically daily. Patient not taking: No sig reported 07/16/20   Angiulli, Lavon Paganini, PA-C  ondansetron (ZOFRAN) 4 MG tablet Take 1 tablet (4 mg total) by mouth every 6 (six) hours as needed for nausea. 07/15/20   Angiulli, Lavon Paganini, PA-C  promethazine (PHENERGAN) 25 MG suppository Place 25 mg rectally every 6 (six) hours as needed for nausea or vomiting.     [provider]  promethazine (PHENERGAN) 25 MG/ML injection Inject 25 mg into the vein every 6 (six) hours as needed for nausea or vomiting.    [provider]   Physical Exam: Blood pressure (!) 164/101, pulse 87, temperature 98 F (36.7 C), temperature source Oral, resp. rate 17, height '6\' 6"'$  (1.981 m), weight 118.8 kg, SpO2 99 %. Vitals:   11/01/20 1136 11/01/20 1558  BP: (!) 187/99 (!) 164/101  Pulse: 86 87  Resp:    Temp:  98 F (36.7 C)  SpO2: 100% 99%    General: Moderately built and nourished. Eyes: Anicteric no pallor. ENT: No discharge from the ears eyes nose and mouth. Neck: No mass felt.  No neck rigidity. Cardiovascular: S1-S2 heard. Respiratory: No rhonchi or crepitations. Abdomen: Soft nontender bowel sounds present. Skin: Right lower extremity dressing. Musculoskeletal: Right lower extremity in dressing. Psychiatric: Appears normal. Neurologic: Alert awake oriented time place and person.  Moves all extremities.  Labs on Admission:  Basic Metabolic Panel: Recent  Labs  Lab 10/29/20 0816 10/29/20 1621 10/30/20 0412 10/31/20 0340  NA 135 132* 133* 135  K 3.8 4.5 4.0 3.9  CL 98 97* 97* 98  CO2  --  '25 23 30  '$ GLUCOSE 138* 247* 277* 122*  BUN 30* 35* 42* 22*  CREATININE 3.80* 4.07* 4.58* 3.19*  CALCIUM  --  8.4* 8.3* 8.4*  PHOS  --  3.5  --   --    Liver Function Tests: Recent Labs  Lab 10/29/20 1621  ALBUMIN 2.1*   No results for input(s): LIPASE, AMYLASE in the last 168 hours. No results for input(s): AMMONIA in the last 168 hours. CBC: Recent Labs  Lab 10/29/20 0816 10/29/20 1621 10/30/20 0412 10/31/20 0340  WBC  --  12.1* 14.6* 11.8*  HGB 9.5* 8.6* 7.9* 7.7*  HCT 28.0* 29.8* 27.1* 26.7*  MCV  --  83.2 82.6 83.2  PLT  --  320 371 375   Cardiac Enzymes: No results for input(s): CKTOTAL, CKMB, CKMBINDEX, TROPONINI in the last 168 hours. BNP: Invalid input(s): POCBNP CBG: Recent Labs  Lab 10/31/20 1632  10/31/20 2051 11/01/20 0804 11/01/20 1137 11/01/20 1635  GLUCAP 125* 150* 113* 122* 111*    Radiological Exams on Admission: No results found.    Time spent: 50 minutes.  Rise Patience [facility]  If 7PM-7AM, please contact night-coverage [email or web address] Password [facility] 11/01/2020, 8:54 PM

## 2020-11-01 NOTE — Progress Notes (Signed)
Patient is postop day 3 status post right below-knee amputation.  He is lying in bed.  He is concerned about his sacral wound.  Vital signs stable alert wound VAC is working well with no cc in the canister dressing is clean and intact.    Will write for discharged back to [facility] if possible today.  Wound ostomy nurse was consulted for care of the sacral wound and patient is seen by Dr. Renard Hamper at the nursing home that cares for this.  Patient will need 1 week follow-up in our office

## 2020-11-01 NOTE — Progress Notes (Signed)
[name] elevated today. Dr. Sharol Given was contacted and he ordered a one time dose of Amlodipine. Stated that pt will need to follow up with PCP to maybe get BP medications changed to better suit pt. Will continue to monitor.

## 2020-11-01 NOTE — Discharge Summary (Signed)
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 male who was admitted 10/29/2020 with a chief complaint of right foot osteomyelitis, with a final diagnosis of Abscess, Osteomyelitis Right Foot.  Patient was brought to the operating room on 10/29/2020 and underwent Procedure(s): RIGHT BELOW KNEE AMPUTATION.    Patient was given perioperative antibiotics:  Anti-infectives (From admission, onward)    Start     Dose/Rate Route Frequency Ordered Stop   10/29/20 2200  ceFAZolin (ANCEF) IVPB 1 g/50 mL premix        1 g 100 mL/hr over 30 Minutes Intravenous Every 12 hours 10/29/20 1507 10/30/20 0901   10/29/20 0730  ceFAZolin (ANCEF) IVPB 2g/100 mL premix        2 g 200 mL/hr over 30 Minutes Intravenous On call to O.R. 10/29/20 0720 10/29/20 1046     .  Patient was given sequential compression devices, early ambulation, and aspirin for DVT prophylaxis.  Recent vital signs: Patient Vitals for the past 24 hrs:  BP Temp Temp src Pulse Resp SpO2  11/01/20 0655 (!) 164/98 97.7 F (36.5 C) Oral 82 18 100 %  10/31/20 1926 (!) 156/96 97.6 F (36.4 C) Oral 85 18 100 %  10/31/20 1739 (!) 160/95 98.1 F (36.7 C) Oral 89 16 99 %  10/31/20 0943 (!) 150/91 97.8 F (36.6 C) Oral 85 16 100 %  .  Recent laboratory studies: No results found.  Discharge Medications:   Allergies as of 11/01/2020       Reactions   Atorvastatin Hives   NOT on MAR   Trazodone Hcl Other (See Comments)   Confusion (intolerance)        Medication List     STOP taking these medications    collagenase ointment Commonly known as: SANTYL   metroNIDAZOLE 500 MG tablet Commonly known as: FLAGYL       TAKE these medications     acetaminophen 325 MG tablet Commonly known as: TYLENOL Take 650 mg by mouth every 6 (six) hours as needed for mild pain or fever (>101 F).   albuterol 108 (90 Base) MCG/ACT inhaler Commonly known as: VENTOLIN HFA Inhale 1-2 puffs into the lungs every 4 (four) hours as needed for wheezing or shortness of breath.   cadexomer iodine 0.9 % gel Commonly known as: IODOSORB Apply 1 application topically every other day. Applied to left 4th toe   ferrous sulfate 325 (65 FE) MG tablet Take 325 mg by mouth in the morning.   insulin glargine 100 UNIT/ML injection Commonly known as: LANTUS Inject 0.11 mLs (11 Units total) into the skin at bedtime.   lanthanum 500 MG chewable tablet Commonly known as: FOSRENOL Chew 500 mg by mouth 3 (three) times daily with meals.   melatonin 3 MG Tabs tablet Take 3 mg by mouth at bedtime. (1900)   midodrine 10 MG tablet Commonly known as: PROAMATINE Take 10 mg by mouth See admin instructions. Take 1 tablet (10 mg) by mouth before dialysis on Tuesdays, Thursdays & Saturdays   multivitamin Tabs tablet Take 1 tablet by mouth at bedtime.   NEPRO/CARBSTEADY PO Take 237 mLs by mouth in the morning.   ondansetron 4 MG tablet Commonly known as: ZOFRAN Take  1 tablet (4 mg total) by mouth every 6 (six) hours as needed for nausea.   oxyCODONE 5 MG immediate release tablet Commonly known as: Oxy IR/ROXICODONE Take 1-2 tablets (5-10 mg total) by mouth every 4 (four) hours as needed for moderate pain (pain score 4-6).   pantoprazole 40 MG tablet Commonly known as: PROTONIX Take 1 tablet (40 mg total) by mouth daily at 6 (six) AM.   PRO-STAT SUGAR FREE PO Take 30 mLs by mouth in the morning and at bedtime.   promethazine 25 MG suppository Commonly known as: PHENERGAN Place 25 mg rectally every 6 (six) hours as needed for nausea or vomiting.   promethazine 25 MG/ML injection Commonly known as: PHENERGAN Inject 25 mg into the vein every 6 (six) hours  as needed for nausea or vomiting.   traZODone 50 MG tablet Commonly known as: DESYREL Take 50 mg by mouth at bedtime.   vitamin C 500 MG tablet Commonly known as: ASCORBIC ACID Take 500 mg by mouth in the morning.   Vitamin D3 50 MCG (2000 UT) Tabs Take 2,000 Units by mouth daily.       ASK your doctor about these medications    amLODipine 5 MG tablet Commonly known as: NORVASC Take 1 tablet (5 mg total) by mouth at bedtime.   citalopram 20 MG tablet Commonly known as: CELEXA Take 1 tablet (20 mg total) by mouth daily.        Diagnostic Studies: VAS [place] UPPER EXTREMITY ARTERIAL DUPLEX  Result Date: 10/04/2020  UPPER EXTREMITY DUPLEX STUDY Patient Name:  Fred [name]  Date of Exam:   10/04/2020 Medical Rec #: [id number]     Accession #:    [id number] Date of Birth: [birth year]    Patient Gender: M Patient Age:   49Y Exam Location:  [facility] Procedure:      VAS [place] UPPER EXTREMITY ARTERIAL DUPLEX Referring Phys: [id number] Serafina Mitchell --------------------------------------------------------------------------------  Indications: Pre-access.  Risk Factors: Hypertension, Diabetes. Limitations: Movement Comparison Study: None Performing Technologist: Ivan Croft  Examination Guidelines: A complete evaluation includes B-mode imaging, spectral Doppler, color Doppler, and power Doppler as needed of all accessible portions of each vessel. Bilateral testing is considered an integral part of a complete examination. Limited examinations for reoccurring indications may be performed as noted.  Right Pre-Dialysis Findings: +-----------------------+----------+--------------------+---------+--------+ Location               PSV (cm/s)Intralum. Diam. (cm)Waveform Comments +-----------------------+----------+--------------------+---------+--------+ Brachial Antecub. fossa54        0.45                triphasic          +-----------------------+----------+--------------------+---------+--------+ Radial Art at Wrist    39        0.20                triphasic         +-----------------------+----------+--------------------+---------+--------+ Ulnar Art at Wrist     44        0.17                triphasic         +-----------------------+----------+--------------------+---------+--------+  Left Pre-Dialysis Findings: +-----------------------+----------+--------------------+---------+--------+ Location               PSV (cm/s)Intralum. Diam. (cm)Waveform Comments +-----------------------+----------+--------------------+---------+--------+ Brachial Antecub. fossa61        0.51                triphasic         +-----------------------+----------+--------------------+---------+--------+  Radial Art at Wrist    48        0.21                triphasic         +-----------------------+----------+--------------------+---------+--------+ Ulnar Art at Wrist     58        0.23                triphasic         +-----------------------+----------+--------------------+---------+--------+  Electronically signed by Harold Barban MD on 10/04/2020 at 9:04:28 PM.    Final    XR Ankle 2 Views Left  Result Date: 10/06/2020 2 views of the left ankle demonstrate no acute changes degenerative changes  XR Foot 2 Views Left  Result Date: 10/06/2020 2 views of his left foot demonstrate degenerative changes with previous trauma.  He also has destructive changes in the distal fifth metatarsal and fourth distal metatarsal consistent with osteomyelitis.  This has progressed from previous x-rays in December  XR Foot 2 Views Right  Result Date: 10/06/2020 2 views of the right foot demonstrate significant destructive changes in the right calcaneus.  Also calcification within the vessels.  Significantly progressed from previous x-rays in December  VAS [place]  Result Date: 10/04/2020 [place] Patient Name:  Fred [name]  Date of Exam:   10/04/2020 Medical Rec #: [id number]     Accession #:    [id number] Date of Birth: 1970/11/22    Patient Gender: M Patient Age:   49Y Exam Location:  [facility] Procedure:      VAS [place] UPPER EXT VEIN MAPPING (PRE-OP AVF) Referring Phys: [id number] Serafina Mitchell --------------------------------------------------------------------------------  Indications: Pre-access. Limitations: Movement Comparison Study: None Performing Technologist: Ivan Croft  Examination Guidelines: A complete evaluation includes B-mode imaging, spectral Doppler, color Doppler, and power Doppler as needed of all accessible portions of each vessel. Bilateral testing is considered an integral part of a complete examination. Limited examinations for reoccurring indications may be performed as noted. +-----------------+-------------+----------+---------+ Right Cephalic   Diameter (cm)Depth (cm)Findings  +-----------------+-------------+----------+---------+ Shoulder             0.41        1.06             +-----------------+-------------+----------+---------+ Prox upper arm       0.49        0.76             +-----------------+-------------+----------+---------+ Mid upper arm        0.50        0.43   branching +-----------------+-------------+----------+---------+ Dist upper arm       0.39        0.58             +-----------------+-------------+----------+---------+ Antecubital fossa    0.43        0.62   branching +-----------------+-------------+----------+---------+ Prox forearm         0.40        0.55             +-----------------+-------------+----------+---------+ Mid forearm          0.36        0.37             +-----------------+-------------+----------+---------+ Dist forearm         0.32        0.23   branching +-----------------+-------------+----------+---------+ Wrist  0.26        0.27              +-----------------+-------------+----------+---------+ +-----------------+-------------+----------+---------+ Right Basilic    Diameter (cm)Depth (cm)Findings  +-----------------+-------------+----------+---------+ Prox upper arm       0.42        1.22             +-----------------+-------------+----------+---------+ Mid upper arm        0.49        1.21   branching +-----------------+-------------+----------+---------+ Dist upper arm       0.32        0.76             +-----------------+-------------+----------+---------+ Antecubital fossa    0.25        0.60             +-----------------+-------------+----------+---------+ Prox forearm         0.25        0.20             +-----------------+-------------+----------+---------+ Mid forearm          0.24        0.25             +-----------------+-------------+----------+---------+ Distal forearm       0.17        0.24             +-----------------+-------------+----------+---------+ Wrist                0.15        0.24             +-----------------+-------------+----------+---------+ +-----------------+-------------+----------+---------+ Left Cephalic    Diameter (cm)Depth (cm)Findings  +-----------------+-------------+----------+---------+ Shoulder             0.35        1.05             +-----------------+-------------+----------+---------+ Prox upper arm       0.46        0.76             +-----------------+-------------+----------+---------+ Mid upper arm        0.36        0.58   branching +-----------------+-------------+----------+---------+ Dist upper arm       0.39        0.48   branching +-----------------+-------------+----------+---------+ Antecubital fossa    0.44        0.27             +-----------------+-------------+----------+---------+ Prox forearm         0.31        0.63             +-----------------+-------------+----------+---------+ Mid forearm           0.30        0.44   branching +-----------------+-------------+----------+---------+ Dist forearm         0.19        0.30             +-----------------+-------------+----------+---------+ Wrist                0.14        0.39             +-----------------+-------------+----------+---------+ +-----------------+-------------+----------+---------+ Left Basilic     Diameter (cm)Depth (cm)Findings  +-----------------+-------------+----------+---------+ Prox upper arm       0.56        0.96             +-----------------+-------------+----------+---------+ Mid upper  arm        0.45        0.91             +-----------------+-------------+----------+---------+ Dist upper arm       0.51        0.93   branching +-----------------+-------------+----------+---------+ Antecubital fossa    0.37        0.39             +-----------------+-------------+----------+---------+ Prox forearm         0.32        0.36   branching +-----------------+-------------+----------+---------+ Mid forearm          0.32        0.36             +-----------------+-------------+----------+---------+ Distal forearm       0.24        0.27             +-----------------+-------------+----------+---------+ Wrist                0.18        0.27             +-----------------+-------------+----------+---------+ *See table(s) above for measurements and observations.  Diagnosing physician: Harold Barban MD Electronically signed by Harold Barban MD on 10/04/2020 at 9:04:12 PM.    Final     Patient benefited maximally from their hospital stay and there were no complications.     Disposition: Discharge disposition: 03-Skilled Nursing Facility      Discharge Instructions     Call MD / Call 911   Complete by: As directed    If you experience chest pain or shortness of breath, CALL 911 and be transported to the hospital emergency room.  If you develope a fever above 101 F, pus  (white drainage) or increased drainage or redness at the wound, or calf pain, call your surgeon's office.   Constipation Prevention   Complete by: As directed    Drink plenty of fluids.  Prune juice may be helpful.  You may use a stool softener, such as Colace (over the counter) 100 mg twice a day.  Use MiraLax (over the counter) for constipation as needed.   Diet - low sodium heart healthy   Complete by: As directed    Discharge instructions   Complete by: As directed    Elevate stump. Call office if vac alarms   Increase activity slowly as tolerated   Complete by: As directed    Negative Pressure Wound Therapy - Incisional   Complete by: As directed    Show patient how to attach vac. Call office if alarms   Post-operative opioid taper instructions:   Complete by: As directed    POST-OPERATIVE OPIOID TAPER INSTRUCTIONS: It is important to wean off of your opioid medication as soon as possible. If you do not need pain medication after your surgery it is ok to stop day one. Opioids include: Codeine, Hydrocodone(Norco, Vicodin), Oxycodone(Percocet, oxycontin) and hydromorphone amongst others.  Long term and even short term use of opiods can cause: Increased pain response Dependence Constipation Depression Respiratory depression And more.  Withdrawal symptoms can include Flu like symptoms Nausea, vomiting And more Techniques to manage these symptoms Hydrate well Eat regular healthy meals Stay active Use relaxation techniques(deep breathing, meditating, yoga) Do Not substitute Alcohol to help with tapering If you have been on opioids for less than two weeks and do not have pain than it is ok to stop  all together.  Plan to wean off of opioids This plan should start within one week post op of your joint replacement. Maintain the same interval or time between taking each dose and first decrease the dose.  Cut the total daily intake of opioids by one tablet each day Next start to  increase the time between doses. The last dose that should be eliminated is the evening dose.          Follow-up Information     Ted Leonhart, [place], [place] Follow up in 1 week(s).   Specialty: Orthopedic Surgery Contact information: [address] [place] [address] [phone]                  Signed: Bevely Palmer Amparo Donalson 11/01/2020, 7:42 AM

## 2020-11-01 NOTE — Plan of Care (Signed)
  Problem: Education: Goal: Knowledge of General Education information will improve Description: Including pain rating scale, medication(s)/side effects and non-pharmacologic comfort measures Outcome: Adequate for Discharge   Problem: Health Behavior/Discharge Planning: Goal: Ability to manage health-related needs will improve Outcome: Adequate for Discharge   Problem: Clinical Measurements: Goal: Ability to maintain clinical measurements within normal limits will improve Outcome: Adequate for Discharge Goal: Will remain free from infection Outcome: Adequate for Discharge Goal: Diagnostic test results will improve Outcome: Adequate for Discharge Goal: Respiratory complications will improve Outcome: Adequate for Discharge Goal: Cardiovascular complication will be avoided Outcome: Adequate for Discharge   Problem: Activity: Goal: Risk for activity intolerance will decrease Outcome: Adequate for Discharge   Problem: Nutrition: Goal: Adequate nutrition will be maintained Outcome: Adequate for Discharge   Problem: Coping: Goal: Level of anxiety will decrease Outcome: Adequate for Discharge   Problem: Elimination: Goal: Will not experience complications related to bowel motility Outcome: Adequate for Discharge Goal: Will not experience complications related to urinary retention Outcome: Adequate for Discharge   Problem: Pain Managment: Goal: General experience of comfort will improve Outcome: Adequate for Discharge   Problem: Safety: Goal: Ability to remain free from injury will improve Outcome: Adequate for Discharge   Problem: Skin Integrity: Goal: Risk for impaired skin integrity will decrease Outcome: Adequate for Discharge   Problem: Acute Rehab PT Goals(only PT should resolve) Goal: Pt will Roll Supine to Side Outcome: Adequate for Discharge Goal: Pt Will Go Supine/Side To Sit Outcome: Adequate for Discharge Goal: Patient Will Perform Sitting Balance Outcome:  Adequate for Discharge Goal: Pt Will Transfer Bed To Chair/Chair To Bed Outcome: Adequate for Discharge   Problem: Increased Nutrient Needs (NI-5.1) Goal: Food and/or nutrient delivery Description: Individualized approach for food/nutrient provision. Outcome: Adequate for Discharge   Problem: Acute Rehab OT Goals (only OT should resolve) Goal: Pt. Will Perform Lower Body Bathing Outcome: Adequate for Discharge Goal: Pt. Will Perform Upper Body Dressing Outcome: Adequate for Discharge Goal: Pt. Will Perform Lower Body Dressing Outcome: Adequate for Discharge Goal: Pt. Will Transfer To Toilet Outcome: Adequate for Discharge Goal: Pt. Will Perform Toileting-Clothing Manipulation Outcome: Adequate for Discharge

## 2020-11-01 NOTE — Progress Notes (Signed)
Report was given to [facility] at [facility].

## 2020-11-01 NOTE — Progress Notes (Addendum)
Fred [name] Progress Note   Subjective: Seen in room. No C/Os. Says they are talking about sending him to [name]. DC summary noted. HD tomorrow on schedule either here or OP clinic  Objective Vitals:   10/31/20 1926 11/01/20 0655 11/01/20 0807 11/01/20 0843  BP: (!) 156/96 (!) 164/98 (!) 199/83 (!) 194/72  Pulse: 85 82 87   Resp: '18 18 17   '$ Temp: 97.6 F (36.4 C) 97.7 F (36.5 C) 97.7 F (36.5 C)   TempSrc: Oral Oral Oral   SpO2: 100% 100% 100%   Weight:      Height:       Physical Exam General: Chronically ill appearing male in NAD Heart: S1,S2 RRR No M/R/G Lungs: CTAB Abdomen: S, NT Extremities: R BKA with wound vac. No LLE edema Dialysis Access: RIJ TDC drsg CDI.   Additional Objective Labs: Basic Metabolic Panel: Recent Labs  Lab 10/29/20 1621 10/30/20 0412 10/31/20 0340  NA 132* 133* 135  K 4.5 4.0 3.9  CL 97* 97* 98  CO2 '25 23 30  '$ GLUCOSE 247* 277* 122*  BUN 35* 42* 22*  CREATININE 4.07* 4.58* 3.19*  CALCIUM 8.4* 8.3* 8.4*  PHOS 3.5  --   --    Liver Function Tests: Recent Labs  Lab 10/29/20 1621  ALBUMIN 2.1*   No results for input(s): LIPASE, AMYLASE in the last 168 hours. CBC: Recent Labs  Lab 10/29/20 1621 10/30/20 0412 10/31/20 0340  WBC 12.1* 14.6* 11.8*  HGB 8.6* 7.9* 7.7*  HCT 29.8* 27.1* 26.7*  MCV 83.2 82.6 83.2  PLT 320 371 375   Blood Culture    Component Value Date/Time   SDES SACRAL 06/30/2020 0013   SPECREQUEST NONE 06/30/2020 0013   CULT MULTIPLE ORGANISMS PRESENT, NONE PREDOMINANT (A) 06/30/2020 0013   REPTSTATUS 07/02/2020 FINAL 06/30/2020 0013    Cardiac Enzymes: No results for input(s): CKTOTAL, CKMB, CKMBINDEX, TROPONINI in the last 168 hours. CBG: Recent Labs  Lab 10/31/20 0637 10/31/20 1219 10/31/20 1632 10/31/20 2051 11/01/20 0804  GLUCAP 105* 115* 125* 150* 113*   Iron Studies:  Recent Labs    10/30/20 1212  IRON 43*  TIBC 115*   '@lablastinr3'$ @ Studies/Results: No  results found. Medications:  sodium chloride 75 mL/hr at 10/29/20 1514    (feeding supplement) PROSource Plus  30 mL Oral BID BM   amLODipine  5 mg Oral QHS   vitamin C  500 mg Oral Daily   Chlorhexidine Gluconate Cloth  6 each Topical Q0600   citalopram  20 mg Oral Daily   docusate sodium  100 mg Oral Daily   feeding supplement (NEPRO CARB STEADY)  237 mL Oral q AM   insulin aspart  0-9 Units Subcutaneous TID WC & HS   insulin glargine  11 Units Subcutaneous QHS   lanthanum  500 mg Oral TID WC   melatonin  3 mg Oral QHS   midodrine  10 mg Oral Q T,Th,Sa-HD   multivitamin  1 tablet Oral QHS   nutrition supplement (JUVEN)  1 packet Oral BID BM   pantoprazole  40 mg Oral Daily   traZODone  50 mg Oral QHS   zinc sulfate  220 mg Oral Daily     Dialysis Orders: TTS - [facility]  4hrs, BFR 400, DFR 500,  EDW 115.5kg, 3K/ 2.5Ca   Access: TDC  Heparin 3000 unit  IV bolus Mircera 225 mcg IV q2wks - last 10/28/20   Assessment/Plan:  R foot abscess/osteomyelitis - non healing  with ABX/wound care. S/p R BKA with application of prevena wound vac on 6/11 by Dr. Sharol Given. PT recommending SNF.    ESRD - on HD TTS. HD tolerated well.  Next HD 11/02/20. Hold heparin d/t surgery. K 4.0.  Hypertension/volume  - BP remains elevated.  Increase amlodipine to '10mg'$  qd.  Not on antihypertensives as outpatient.  EDW recently lowered due to weight loss.  Does not appear grossly volume overloaded.  UF as tolerated.  Will need lower dry weight on d/c post amputation.   Anemia of CKD - Downward trend of HGB post op, currently 7.7. Tsat 37%, no indication for IV iron.  ESA recently dosed. Transfuse prn.  Secondary Hyperparathyroidism - Ca and phos in goal.  Not on VDRA or binders.  Nutrition - On carb modified diet w/fluid restrictions.  K and phos ok. Monitor labs.   Diabetes - per primary  Fred [name] H. Fred Metzger NP-C 11/01/2020, 10:53 AM  [employer] [phone]

## 2020-11-01 NOTE — Progress Notes (Signed)
Contacted about patient on practice call. Was in process of discharging to SNF but patient transport noted increased BP again of ~190/110.  Discussed with patient's RN who noted no new symptoms such as CP, headache aside from patient's baseline post-surgical pain.  He has history of CKD on HD with next session scheduled for tomorrow.  Plan for patient to remain overnight in hospital and hospitalist to consult on elevated blood pressure.  Spoke with hospitalist on call who recommended Hydralazine '10mg'$  PO with further recommendations to follow after seeing patient.

## 2020-11-01 NOTE — TOC Transition Note (Signed)
Transition of Care [facility]) - CM/SW Discharge Note   [name] MRN: [id number] Date of Birth: Jan 17, 1971  Transition of Care [facility]) CM/SW Contact:  Milinda Antis, [place] Phone Number: 11/01/2020, 11:01 AM   Clinical Narrative:    Patient will DC to:  SNF Anticipated DC date: 6/13/022 Family notified: Yes Transport by: Corey Harold   Per MD patient ready for DC to [name]. RN to call report prior to discharge [phone]. RN, patient, patient's family, and facility notified of DC. Discharge Summary and FL2 sent to facility. DC packet on chart. Ambulance transport requested for patient.   CSW will sign off for now as social work intervention is no longer needed. Please consult [place] again if new needs arise.     Final next level of care: Skilled Nursing Facility Barriers to Discharge: Barriers Resolved   Patient Goals and CMS Choice Patient states their goals for this hospitalization and ongoing recovery are:: "be able to walk" CMS Medicare.gov Compare Post Acute Care list provided to::  (NA-pt wants to return to [name]) Choice offered to / list presented to : NA  Discharge Placement   Existing PASRR number confirmed : 11/01/20          Patient chooses bed at: [facility] [place] Patient to be transferred to facility by: [place] Name of family member notified: Katrina Stack (Sister)   [phone] Patient and family notified of of transfer: 11/01/20  Discharge Plan and Services In-house Referral: Clinical Social Work   Post Acute Care Choice: [facility]                               Social Determinants of Health (SDOH) Interventions     Readmission Risk Interventions No flowsheet data found.

## 2020-11-02 DIAGNOSIS — M86271 Subacute osteomyelitis, right ankle and foot: Secondary | ICD-10-CM

## 2020-11-02 DIAGNOSIS — Z992 Dependence on renal dialysis: Secondary | ICD-10-CM

## 2020-11-02 DIAGNOSIS — N186 End stage renal disease: Secondary | ICD-10-CM

## 2020-11-02 LAB — CBC
HCT: 26.4 % — ABNORMAL LOW (ref 39.0–52.0)
Hemoglobin: 7.5 g/dL — ABNORMAL LOW (ref 13.0–17.0)
MCH: 24 pg — ABNORMAL LOW (ref 26.0–34.0)
MCHC: 28.4 g/dL — ABNORMAL LOW (ref 30.0–36.0)
MCV: 84.6 fL (ref 80.0–100.0)
Platelets: 409 10*3/uL — ABNORMAL HIGH (ref 150–400)
RBC: 3.12 MIL/uL — ABNORMAL LOW (ref 4.22–5.81)
RDW: 18.4 % — ABNORMAL HIGH (ref 11.5–15.5)
WBC: 10.3 10*3/uL (ref 4.0–10.5)
nRBC: 0 % (ref 0.0–0.2)

## 2020-11-02 LAB — RENAL FUNCTION PANEL
Albumin: 2 g/dL — ABNORMAL LOW (ref 3.5–5.0)
Anion gap: 8 (ref 5–15)
BUN: 55 mg/dL — ABNORMAL HIGH (ref 6–20)
CO2: 28 mmol/L (ref 22–32)
Calcium: 8.8 mg/dL — ABNORMAL LOW (ref 8.9–10.3)
Chloride: 98 mmol/L (ref 98–111)
Creatinine, Ser: 5.42 mg/dL — ABNORMAL HIGH (ref 0.61–1.24)
GFR, Estimated: 12 mL/min — ABNORMAL LOW (ref >60)
Glucose, Bld: 84 mg/dL (ref 70–99)
Phosphorus: 3.7 mg/dL (ref 2.5–4.6)
Potassium: 4.3 mmol/L (ref 3.5–5.1)
Sodium: 134 mmol/L — ABNORMAL LOW (ref 135–145)

## 2020-11-02 LAB — GLUCOSE, CAPILLARY
Glucose-Capillary: 77 mg/dL (ref 70–99)
Glucose-Capillary: 83 mg/dL (ref 70–99)
Glucose-Capillary: 128 mg/dL — ABNORMAL HIGH (ref 70–99)
Glucose-Capillary: 136 mg/dL — ABNORMAL HIGH (ref 70–99)

## 2020-11-02 MED ORDER — PENTAFLUOROPROP-TETRAFLUOROETH EX AERO: 1.0000 "application " | INHALATION_SPRAY | CUTANEOUS | Status: DC | PRN

## 2020-11-02 MED ORDER — AMLODIPINE BESYLATE 10 MG PO TABS
10.0000 mg | ORAL_TABLET | Freq: Every day | ORAL | Status: DC
  Administered 2020-11-02: 10 mg via ORAL
  Filled 2020-11-02: qty 1

## 2020-11-02 MED ORDER — LIDOCAINE HCL (PF) 1 % IJ SOLN: 5.0000 mL | INTRAMUSCULAR | Status: DC | PRN

## 2020-11-02 MED ORDER — HEPARIN SODIUM (PORCINE) 1000 UNIT/ML DIALYSIS: 1000.0000 [IU] | INTRAMUSCULAR | Status: DC | PRN

## 2020-11-02 MED ORDER — INSULIN GLARGINE 100 UNIT/ML ~~LOC~~ SOLN
8.0000 [IU] | Freq: Every day | SUBCUTANEOUS | Status: DC
  Administered 2020-11-02: 8 [IU] via SUBCUTANEOUS
  Filled 2020-11-02 (×2): qty 0.08

## 2020-11-02 MED ORDER — LIDOCAINE-PRILOCAINE 2.5-2.5 % EX CREA: 1.0000 "application " | TOPICAL_CREAM | CUTANEOUS | Status: DC | PRN

## 2020-11-02 MED ORDER — ALTEPLASE 2 MG IJ SOLR: 2.0000 mg | Freq: Once | INTRAMUSCULAR | Status: DC | PRN

## 2020-11-02 MED ORDER — HEPARIN SODIUM (PORCINE) 1000 UNIT/ML IJ SOLN
INTRAMUSCULAR | Status: AC
  Administered 2020-11-02: 3800 [IU] via INTRAVENOUS_CENTRAL
  Filled 2020-11-02: qty 4

## 2020-11-02 MED ORDER — DARBEPOETIN ALFA 200 MCG/0.4ML IJ SOSY: 200.0000 ug | PREFILLED_SYRINGE | INTRAMUSCULAR | Status: DC

## 2020-11-02 MED ORDER — INSULIN GLARGINE 100 UNIT/ML ~~LOC~~ SOLN: 8.0000 [IU] | Freq: Every day | SUBCUTANEOUS | Status: AC

## 2020-11-02 MED ORDER — SODIUM CHLORIDE 0.9 % IV SOLN: 100.0000 mL | INTRAVENOUS | Status: DC | PRN

## 2020-11-02 NOTE — Progress Notes (Signed)
PT Cancellation Note  Patient Details Name: Fred [name] MRN: [id number] DOB: 1970-08-07   Cancelled Treatment:    Reason Eval/Treat Not Completed: Patient at procedure or test/unavailable. Pt in HD.   Shary Decamp [facility] 11/02/2020, 8:35 AM [place] Pager [phone] Office [phone]

## 2020-11-02 NOTE — Progress Notes (Signed)
Inpatient Diabetes Program Recommendations  AACE/ADA: New Consensus Statement on Inpatient Glycemic Control (2015)  Target Ranges:  Prepandial:   less than 140 mg/dL      Peak postprandial:   less than 180 mg/dL (1-2 hours)      Critically ill patients:  140 - 180 mg/dL   Lab Results  Component Value Date   GLUCAP 83 11/02/2020   HGBA1C 4.8 10/29/2020    Review of Glycemic Control Results for Fred [name], Fred [name] (MRN [id number]) as of 11/02/2020 12:39  Ref. Range 11/01/2020 08:04 11/01/2020 11:37 11/01/2020 16:35 11/01/2020 21:44 11/02/2020 05:11 11/02/2020 12:24  Glucose-Capillary Latest Ref Range: 70 - 99 mg/dL 113 (H) 122 (H) 111 (H) 95 77 83   Inpatient Diabetes Program Recommendations:   Fasting CBG 77 this am. -Decrease Lantus to 8 units qd  Thank you, Nani Gasser. Mayli Covington, RN, MSN, CDE  Diabetes Coordinator Inpatient Glycemic Control Team Team Pager [phone] (8am-5pm) 11/02/2020 12:40 PM

## 2020-11-02 NOTE — Progress Notes (Signed)
Patient ID: Fred [name], male   DOB: Jun 16, 1970, 49 y.o.   MRN: [id number] Patient is status post right transtibial amputation.  Patient was to discharge to [facility] but this was delayed secondary to his hypertension.  Hospitalist was consulted patient did receive a dose of hydralazine as well as a dose of his Norvasc without much change in his blood pressure.  Hospitalist consulted.  Anticipate discharge to skilled nursing in a day or 2.

## 2020-11-02 NOTE — Progress Notes (Signed)
[facility] Progress Note   Subjective: Discharge canceled D/T HTN. Resumed amlodipine. BP coming down with HD. Should be able to discharge post HD.   Objective Vitals:   11/02/20 0740 11/02/20 0750 11/02/20 0755 11/02/20 0826  BP: (!) 185/108 (!) 182/101 (!) 176/104 130/86  Pulse: 84 79 80 81  Resp: '16 18 18   '$ Temp: 97.7 F (36.5 C)     TempSrc: Oral     SpO2: 99%     Weight: 114.3 kg     Height:       Physical Exam General: Chronically ill appearing male in NAD Heart: S1,S2 RRR No M/R/G Lungs: CTAB Abdomen: S, NT Extremities: R BKA with wound vac. No LLE edema Dialysis Access: RIJ TDC drsg CDI.    Additional Objective Labs: Basic Metabolic Panel: Recent Labs  Lab 10/29/20 1621 10/30/20 0412 10/31/20 0340  NA 132* 133* 135  K 4.5 4.0 3.9  CL 97* 97* 98  CO2 '25 23 30  '$ GLUCOSE 247* 277* 122*  BUN 35* 42* 22*  CREATININE 4.07* 4.58* 3.19*  CALCIUM 8.4* 8.3* 8.4*  PHOS 3.5  --   --    Liver Function Tests: Recent Labs  Lab 10/29/20 1621  ALBUMIN 2.1*   No results for input(s): LIPASE, AMYLASE in the last 168 hours. CBC: Recent Labs  Lab 10/29/20 1621 10/30/20 0412 10/31/20 0340  WBC 12.1* 14.6* 11.8*  HGB 8.6* 7.9* 7.7*  HCT 29.8* 27.1* 26.7*  MCV 83.2 82.6 83.2  PLT 320 371 375   Blood Culture    Component Value Date/Time   SDES SACRAL 06/30/2020 0013   SPECREQUEST NONE 06/30/2020 0013   CULT MULTIPLE ORGANISMS PRESENT, NONE PREDOMINANT (A) 06/30/2020 0013   REPTSTATUS 07/02/2020 FINAL 06/30/2020 0013    Cardiac Enzymes: No results for input(s): CKTOTAL, CKMB, CKMBINDEX, TROPONINI in the last 168 hours. CBG: Recent Labs  Lab 11/01/20 0804 11/01/20 1137 11/01/20 1635 11/01/20 2144 11/02/20 0511  GLUCAP 113* 122* 111* 95 77   Iron Studies:  Recent Labs    10/30/20 1212  IRON 43*  TIBC 115*   '@lablastinr3'$ @ Studies/Results: No results found. Medications:  sodium chloride 75 mL/hr at 10/29/20 1514   sodium  chloride     sodium chloride      (feeding supplement) PROSource Plus  30 mL Oral BID BM   amLODipine  5 mg Oral QHS   vitamin C  500 mg Oral Daily   Chlorhexidine Gluconate Cloth  6 each Topical Q0600   citalopram  20 mg Oral Daily   docusate sodium  100 mg Oral Daily   feeding supplement (NEPRO CARB STEADY)  237 mL Oral q AM   insulin aspart  0-9 Units Subcutaneous TID WC & HS   insulin glargine  11 Units Subcutaneous QHS   lanthanum  500 mg Oral TID WC   melatonin  3 mg Oral QHS   multivitamin  1 tablet Oral QHS   nutrition supplement (JUVEN)  1 packet Oral BID BM   pantoprazole  40 mg Oral Daily   traZODone  50 mg Oral QHS   zinc sulfate  220 mg Oral Daily     Dialysis Orders: TTS - [facility]  4hrs, BFR 400, DFR 500,  EDW 115.5kg, 3K/ 2.5Ca   Access: TDC  Heparin 3000 unit  IV bolus Mircera 225 mcg IV q2wks - last 10/28/20   Assessment/Plan:  R foot abscess/osteomyelitis - non healing with ABX/wound care. S/p R BKA with application of  prevena wound vac on 6/11 by Dr. Sharol Given. Arrangements for discharge to [facility] SNF in place.   ESRD - on HD TTS. HD today on schedule, no heparin.   Hypertension/volume  - BP now 118/76, adjusting UF. Discharge held D/T HTN. Resumed amlodipine 5 mg however BP is historically labile. EDW lowered D/T BKA.   Anemia of CKD - Downward trend of HGB post op, was 7.7 10/31/20. New labs pending.  Tsat 37%, no indication for IV iron.  ESA recently dosed. Transfuse prn.  Secondary Hyperparathyroidism - Ca and phos in goal.  Not on VDRA or binders.  Nutrition - On carb modified diet w/fluid restrictions.  K and phos ok. Monitor labs.   Diabetes - per primary    Ebonie Westerlund H. Adedamola Seto NP-C 11/02/2020, 8:50 AM  [employer] [phone]

## 2020-11-02 NOTE — Progress Notes (Signed)
MEDICINE CONSULT PROGRESS NOTE  Fred [name]  [id number] DOB: [birth year] DOA: 10/29/2020 PCP: Merryl Hacker, No Primary/Referring Service: Dr. Sharol Given, Orthopedics Brief Narrative: Fred [name] is a 50 y.o. male with a history of ESRD, T2DM, labile HTN who was admitted from [facility] for transtibial amputation on the right leg as treatment for right hindfoot ulceration, abscess, and osteomyelitis. This was performed, and routine HD has been overseen by nephrology. He was discharged back to [facility] 6/13, though this was held up due to elevated BP. Hospitalists were consulted, restarted norvasc, and the patient's blood pressure has improved.   Subjective: Seen in HD, has no chest pain, dyspnea, HA, vision changes or leg swelling.   Objective: BP (!) 159/95 (BP Location: Right Arm)   Pulse 84   Temp 98 F (36.7 C) (Oral)   Resp 16   Ht '6\' 6"'$  (1.981 m)   Wt 110.6 kg   SpO2 99%   BMI 28.18 kg/m   Gen: Nontoxic received HD thru right IJ TDC. Pulm: Clear and nonlabored on room air  CV: RRR, no murmur, no JVD, no edema GI: Soft, NT, ND, +BS  Neuro: Alert and oriented. No focal deficits. Ext: Warm, R TTA noted. Skin: No rashes, lesions or ulcers on visualized skin  Assessment & Plan: Active Problems:   ESRD on dialysis [facility])   Subacute osteomyelitis, right ankle and foot (HCC)   Abscess of bursa of right ankle   Hypertension, uncontrolled  HTN: Historically labile. Given current BP, pt may be safely discharged if HTN is only barrier to discharge. - I have held midodrine which is scheduled for HD days.  - Continue norvasc '5mg'$ . This should provide adequate improvement in BP. He has no attributable symptoms and has not had another reading in severe range since last night. We could increase norvasc to '10mg'$ , though the patient is known to have labile BP, and given the nonurgency, will recommend monitoring BP before adding this.  T2DM: HbA1c is 4.8% ?if this is posttransfusion or otherwise  inaccurate.  - Would decrease basal insulin as he's requiring very little SSI and running low this AM.  Right hindfoot osteomyelitis, abscess: s/p transtibial amputation 6/10.  - Postoperative management per primary.   Patrecia Pour, MD [facility] [email or web address] 11/02/2020, 12:47 PM

## 2020-11-03 LAB — GLUCOSE, CAPILLARY
Glucose-Capillary: 93 mg/dL (ref 70–99)
Glucose-Capillary: 117 mg/dL — ABNORMAL HIGH (ref 70–99)

## 2020-11-03 MED ORDER — HYDRALAZINE HCL 25 MG PO TABS
25.0000 mg | ORAL_TABLET | Freq: Four times a day (QID) | ORAL | Status: DC
  Administered 2020-11-03: 25 mg via ORAL
  Filled 2020-11-03: qty 1

## 2020-11-03 NOTE — Progress Notes (Signed)
Patient is status post right below-knee amputation.  He is on the [facility] and functioning.  Appears comfortable in bed.  Still continues to have labile blood pressure.  Hospitalist was consulted and per the recommendation if hypertension is only barrier to this discharge they have made some adjustments in his medication and have cleared him to be discharged as they say his hypertension and blood pressures have had a history of being labile.  We will go forward to discharge him today and less blood pressure significantly high prior to discharge.  Rx is on the chart

## 2020-11-03 NOTE — Progress Notes (Signed)
[facility] Progress Note   Subjective: Seen in room. Being discharged today.    Objective Vitals:   11/02/20 2000 11/03/20 0600 11/03/20 0744 11/03/20 1032  BP: (!) 184/93 (!) 180/96 (!) 185/102 124/84  Pulse: 89 88 86 89  Resp: '16 16 17   '$ Temp: 98.6 F (37 C) 98.4 F (36.9 C) 97.7 F (36.5 C)   TempSrc: Oral Oral Oral   SpO2: 99% 97% 100% 98%  Weight:      Height:       Physical Exam General: Chronically ill appearing male in NAD Heart: S1,S2 RRR No M/R/G Lungs: CTAB Abdomen: S, NT Extremities: R BKA with wound vac. No LLE edema Dialysis Access: RIJ TDC drsg CDI.   Additional Objective Labs: Basic Metabolic Panel: Recent Labs  Lab 10/29/20 1621 10/30/20 0412 10/31/20 0340 11/02/20 0735  NA 132* 133* 135 134*  K 4.5 4.0 3.9 4.3  CL 97* 97* 98 98  CO2 '25 23 30 28  '$ GLUCOSE 247* 277* 122* 84  BUN 35* 42* 22* 55*  CREATININE 4.07* 4.58* 3.19* 5.42*  CALCIUM 8.4* 8.3* 8.4* 8.8*  PHOS 3.5  --   --  3.7   Liver Function Tests: Recent Labs  Lab 10/29/20 1621 11/02/20 0735  ALBUMIN 2.1* 2.0*   No results for input(s): LIPASE, AMYLASE in the last 168 hours. CBC: Recent Labs  Lab 10/29/20 1621 10/30/20 0412 10/31/20 0340 11/02/20 0735  WBC 12.1* 14.6* 11.8* 10.3  HGB 8.6* 7.9* 7.7* 7.5*  HCT 29.8* 27.1* 26.7* 26.4*  MCV 83.2 82.6 83.2 84.6  PLT 320 371 375 409*   Blood Culture    Component Value Date/Time   SDES SACRAL 06/30/2020 0013   SPECREQUEST NONE 06/30/2020 0013   CULT MULTIPLE ORGANISMS PRESENT, NONE PREDOMINANT (A) 06/30/2020 0013   REPTSTATUS 07/02/2020 FINAL 06/30/2020 0013    Cardiac Enzymes: No results for input(s): CKTOTAL, CKMB, CKMBINDEX, TROPONINI in the last 168 hours. CBG: Recent Labs  Lab 11/02/20 0511 11/02/20 1224 11/02/20 1632 11/02/20 2000 11/03/20 0621  GLUCAP 77 83 128* 136* 93   Iron Studies: No results for input(s): IRON, TIBC, TRANSFERRIN, FERRITIN in the last 72  hours. '@lablastinr3'$ @ Studies/Results: No results found. Medications:  sodium chloride 75 mL/hr at 10/29/20 1514    (feeding supplement) PROSource Plus  30 mL Oral BID BM   amLODipine  10 mg Oral QHS   vitamin C  500 mg Oral Daily   Chlorhexidine Gluconate Cloth  6 each Topical Q0600   citalopram  20 mg Oral Daily   docusate sodium  100 mg Oral Daily   feeding supplement (NEPRO CARB STEADY)  237 mL Oral q AM   hydrALAZINE  25 mg Oral Q6H   insulin aspart  0-9 Units Subcutaneous TID WC & HS   insulin glargine  8 Units Subcutaneous QHS   lanthanum  500 mg Oral TID WC   melatonin  3 mg Oral QHS   multivitamin  1 tablet Oral QHS   nutrition supplement (JUVEN)  1 packet Oral BID BM   pantoprazole  40 mg Oral Daily   traZODone  50 mg Oral QHS   zinc sulfate  220 mg Oral Daily     Dialysis Orders: TTS - [facility]  4hrs, BFR 400, DFR 500,  EDW 115.5kg, 3K/ 2.5Ca   Access: TDC  Heparin 3000 unit  IV bolus Mircera 225 mcg IV q2wks - last 10/28/20   Assessment/Plan:  R foot abscess/osteomyelitis - non healing with ABX/wound  care. S/p R BKA with application of prevena wound vac on 6/11 by Dr. Sharol Given. Arrangements for discharge to [facility] SNF in place.   ESRD - on HD TTS. HD tomorrow at OP clinic  Hypertension/volume  -  Resumed amlodipine 5 then increased to '10mg'$  q hs per primary.  BP is historically labile. EDW lowered D/T BKA.   Anemia of CKD - Downward trend of HGB post op, was 7.7 10/31/20. New labs pending.  Tsat 37%, no indication for IV iron.  ESA recently dosed. Transfuse prn.  Secondary Hyperparathyroidism - Ca and phos in goal.  Not on VDRA or binders.  Nutrition - On carb modified diet w/fluid restrictions.  K and phos ok. Monitor labs.   Diabetes - per primary   [name] NP-C 11/03/2020, 10:56 AM  [employer] [phone]

## 2020-11-03 NOTE — Discharge Planning (Signed)
[facility] here to get patient. BP 115/95.

## 2020-11-03 NOTE — Discharge Summary (Signed)
Discharge Diagnoses:  Active Problems:   ESRD on dialysis [facility])   Subacute osteomyelitis, right ankle and foot ([place])   Abscess of bursa of right ankle   Hypertension, uncontrolled   Surgeries: Procedure(s): RIGHT BELOW KNEE AMPUTATION on 10/29/2020    Consultants: Treatment Team:  Roney Jaffe, MD  Discharged Condition: Improved  Hospital Course: Fred [name] is an 50 y.o. male who was admitted 10/29/2020 with a chief complaint of right foot osteomyelitis, with a final diagnosis of Abscess, Osteomyelitis Right Foot.  Patient was brought to the operating room on 10/29/2020 and underwent Procedure(s): RIGHT BELOW KNEE AMPUTATION.    Patient was given perioperative antibiotics:  Anti-infectives (From admission, onward)    Start     Dose/Rate Route Frequency Ordered Stop   10/29/20 2200  ceFAZolin (ANCEF) IVPB 1 g/50 mL premix        1 g 100 mL/hr over 30 Minutes Intravenous Every 12 hours 10/29/20 1507 10/30/20 0901   10/29/20 0730  ceFAZolin (ANCEF) IVPB 2g/100 mL premix        2 g 200 mL/hr over 30 Minutes Intravenous On call to O.R. 10/29/20 0720 10/29/20 1046     .  Patient was given sequential compression devices, early ambulation, and aspirin for DVT prophylaxis.  Recent vital signs: Patient Vitals for the past 24 hrs:  BP Temp Temp src Pulse Resp SpO2 Weight  11/03/20 1032 124/84 -- -- 89 -- 98 % --  11/03/20 0744 (!) 185/102 97.7 F (36.5 C) Oral 86 17 100 % --  11/03/20 0600 (!) 180/96 98.4 F (36.9 C) Oral 88 16 97 % --  11/02/20 2000 (!) 184/93 98.6 F (37 C) Oral 89 16 99 % --  11/02/20 1526 (!) 160/95 -- -- -- -- -- --  11/02/20 1427 (!) 171/98 98.4 F (36.9 C) Oral 90 17 100 % --  11/02/20 1151 (!) 159/95 98 F (36.7 C) Oral 84 16 99 % 110.6 kg  .  Recent laboratory studies: No results found.  Discharge Medications:   Allergies as of 11/03/2020       Reactions   Atorvastatin Hives   NOT on MAR   Trazodone Hcl Other (See Comments)   Confusion  (intolerance)        Medication List     STOP taking these medications    collagenase ointment Commonly known as: SANTYL   metroNIDAZOLE 500 MG tablet Commonly known as: FLAGYL       TAKE these medications    acetaminophen 325 MG tablet Commonly known as: TYLENOL Take 650 mg by mouth every 6 (six) hours as needed for mild pain or fever (>101 F).   albuterol 108 (90 Base) MCG/ACT inhaler Commonly known as: VENTOLIN HFA Inhale 1-2 puffs into the lungs every 4 (four) hours as needed for wheezing or shortness of breath.   cadexomer iodine 0.9 % gel Commonly known as: IODOSORB Apply 1 application topically every other day. Applied to left 4th toe   ferrous sulfate 325 (65 FE) MG tablet Take 325 mg by mouth in the morning.   insulin glargine 100 UNIT/ML injection Commonly known as: LANTUS Inject 0.08 mLs (8 Units total) into the skin at bedtime. What changed: how much to take   lanthanum 500 MG chewable tablet Commonly known as: FOSRENOL Chew 500 mg by mouth 3 (three) times daily with meals.   melatonin 3 MG Tabs tablet Take 3 mg by mouth at bedtime. (1900)   midodrine 10 MG tablet Commonly known as:  PROAMATINE Take 10 mg by mouth See admin instructions. Take 1 tablet (10 mg) by mouth before dialysis on Tuesdays, Thursdays & Saturdays   multivitamin Tabs tablet Take 1 tablet by mouth at bedtime.   NEPRO/CARBSTEADY PO Take 237 mLs by mouth in the morning.   ondansetron 4 MG tablet Commonly known as: ZOFRAN Take 1 tablet (4 mg total) by mouth every 6 (six) hours as needed for nausea.   oxyCODONE-acetaminophen 5-325 MG tablet Commonly known as: Percocet Take 1 tablet by mouth every 4 (four) hours as needed.   pantoprazole 40 MG tablet Commonly known as: PROTONIX Take 1 tablet (40 mg total) by mouth daily at 6 (six) AM.   PRO-STAT SUGAR FREE PO Take 30 mLs by mouth in the morning and at bedtime.   promethazine 25 MG suppository Commonly known as:  PHENERGAN Place 25 mg rectally every 6 (six) hours as needed for nausea or vomiting.   promethazine 25 MG/ML injection Commonly known as: PHENERGAN Inject 25 mg into the vein every 6 (six) hours as needed for nausea or vomiting.   traZODone 50 MG tablet Commonly known as: DESYREL Take 50 mg by mouth at bedtime.   vitamin C 500 MG tablet Commonly known as: ASCORBIC ACID Take 500 mg by mouth in the morning.   Vitamin D3 50 MCG (2000 UT) Tabs Take 2,000 Units by mouth daily.       ASK your doctor about these medications    amLODipine 5 MG tablet Commonly known as: NORVASC Take 1 tablet (5 mg total) by mouth at bedtime.   citalopram 20 MG tablet Commonly known as: CELEXA Take 1 tablet (20 mg total) by mouth daily.        Diagnostic Studies: XR Ankle 2 Views Left  Result Date: 10/06/2020 2 views of the left ankle demonstrate no acute changes degenerative changes  XR Foot 2 Views Left  Result Date: 10/06/2020 2 views of his left foot demonstrate degenerative changes with previous trauma.  He also has destructive changes in the distal fifth metatarsal and fourth distal metatarsal consistent with osteomyelitis.  This has progressed from previous x-rays in December  XR Foot 2 Views Right  Result Date: 10/06/2020 2 views of the right foot demonstrate significant destructive changes in the right calcaneus.  Also calcification within the vessels.  Significantly progressed from previous x-rays in December   Patient benefited maximally from their hospital stay and there were no complications.     Disposition: Discharge disposition: 03-Skilled Nursing Facility      Discharge Instructions     Call MD / Call 911   Complete by: As directed    If you experience chest pain or shortness of breath, CALL 911 and be transported to the hospital emergency room.  If you develope a fever above 101 F, pus (white drainage) or increased drainage or redness at the wound, or calf pain,  call your surgeon's office.   Call MD / Call 911   Complete by: As directed    If you experience chest pain or shortness of breath, CALL 911 and be transported to the hospital emergency room.  If you develope a fever above 101 F, pus (white drainage) or increased drainage or redness at the wound, or calf pain, call your surgeon's office.   Constipation Prevention   Complete by: As directed    Drink plenty of fluids.  Prune juice may be helpful.  You may use a stool softener, such as Colace (over the counter)  100 mg twice a day.  Use MiraLax (over the counter) for constipation as needed.   Constipation Prevention   Complete by: As directed    Drink plenty of fluids.  Prune juice may be helpful.  You may use a stool softener, such as Colace (over the counter) 100 mg twice a day.  Use MiraLax (over the counter) for constipation as needed.   Diet - low sodium heart healthy   Complete by: As directed    Diet - low sodium heart healthy   Complete by: As directed    Discharge instructions   Complete by: As directed    Elevate stump. Call office if vac alarms   Increase activity slowly as tolerated   Complete by: As directed    Increase activity slowly as tolerated   Complete by: As directed    Negative Pressure Wound Therapy - Incisional   Complete by: As directed    Show patient how to attach vac. Call office if alarms   Post-operative opioid taper instructions:   Complete by: As directed    POST-OPERATIVE OPIOID TAPER INSTRUCTIONS: It is important to wean off of your opioid medication as soon as possible. If you do not need pain medication after your surgery it is ok to stop day one. Opioids include: Codeine, Hydrocodone(Norco, Vicodin), Oxycodone(Percocet, oxycontin) and hydromorphone amongst others.  Long term and even short term use of opiods can cause: Increased pain response Dependence Constipation Depression Respiratory depression And more.  Withdrawal symptoms can include Flu  like symptoms Nausea, vomiting And more Techniques to manage these symptoms Hydrate well Eat regular healthy meals Stay active Use relaxation techniques(deep breathing, meditating, yoga) Do Not substitute Alcohol to help with tapering If you have been on opioids for less than two weeks and do not have pain than it is ok to stop all together.  Plan to wean off of opioids This plan should start within one week post op of your joint replacement. Maintain the same interval or time between taking each dose and first decrease the dose.  Cut the total daily intake of opioids by one tablet each day Next start to increase the time between doses. The last dose that should be eliminated is the evening dose.      Post-operative opioid taper instructions:   Complete by: As directed    POST-OPERATIVE OPIOID TAPER INSTRUCTIONS: It is important to wean off of your opioid medication as soon as possible. If you do not need pain medication after your surgery it is ok to stop day one. Opioids include: Codeine, Hydrocodone(Norco, Vicodin), Oxycodone(Percocet, oxycontin) and hydromorphone amongst others.  Long term and even short term use of opiods can cause: Increased pain response Dependence Constipation Depression Respiratory depression And more.  Withdrawal symptoms can include Flu like symptoms Nausea, vomiting And more Techniques to manage these symptoms Hydrate well Eat regular healthy meals Stay active Use relaxation techniques(deep breathing, meditating, yoga) Do Not substitute Alcohol to help with tapering If you have been on opioids for less than two weeks and do not have pain than it is ok to stop all together.  Plan to wean off of opioids This plan should start within one week post op of your joint replacement. Maintain the same interval or time between taking each dose and first decrease the dose.  Cut the total daily intake of opioids by one tablet each day Next start to  increase the time between doses. The last dose that should be eliminated is the  evening dose.          Contact information for follow-up providers     Simona Rocque, Bevely Palmer, [place] Follow up in 1 week(s).   Specialty: Orthopedic Surgery Contact information: [place] [address] [phone]              Contact information for after-discharge care     Destination     [facility] Preferred SNF .   Service: Skilled Nursing Contact information: [place] [phone]                      Signed: Bevely Palmer Christoher Drudge 11/03/2020, 11:46 AM

## 2020-11-03 NOTE — Discharge Planning (Signed)
Report called to receiving RN. Hydralazine given for SBP 160 and receiving RN informed. Awaiting PTAR, patient with no complaints at the current time.

## 2020-11-03 NOTE — Progress Notes (Signed)
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 PMH includes: DM II, HTN, ESRD on HD, anemia, GERD,  cholecystectomy 03/23/20, obesity, and COVID (1/22).    PT Comments    Pt was seen for mobility on bed to stretch and strengthen LLE and work on hip stability on RLE.  Pt is in his limb protector on RLE, and is concerned about traumatizing the leg.  Talked with him about the protective element to the positioning splint, but is still reluctant to slide the leg in the brace.  Follow up with acute PT goals if pt is delayed for discharge.   Follow Up Recommendations  SNF;Supervision/Assistance - 24 hour     Equipment Recommendations  None recommended by PT    Recommendations for Other Services       Precautions / Restrictions Precautions Precautions: Fall Precaution Comments: stage IV sacral ulcer Required Braces or Orthoses: Other Brace Other Brace: limb protector Restrictions Weight Bearing Restrictions: Yes RLE Weight Bearing: Non weight bearing    Mobility  Bed Mobility               General bed mobility comments: in bed and remained there    Transfers                 General transfer comment: deferred  Ambulation/Gait                 Stairs             Wheelchair Mobility    Modified Rankin (Stroke Patients Only)       Balance                                            Cognition Arousal/Alertness: Awake/alert Behavior During Therapy: WFL for tasks assessed/performed;Flat affect Overall Cognitive Status: Within Functional Limits for tasks assessed                                        Exercises General Exercises - Lower Extremity Ankle Circles/Pumps: AROM;Left;5 reps Quad Sets: AROM;10 reps Gluteal Sets:  AROM;10 reps Heel Slides: AROM;AAROM;10 reps;Left Hip ABduction/ADduction: AROM;AAROM;Both;10 reps    General Comments General comments (skin integrity, edema, etc.): Pt is in bed with nursing arriving to remove his IV.  Pt is being transported to SNF today, and will continue rehab to get home from R BK amputation      Pertinent Vitals/Pain Pain Assessment: Faces Faces Pain Scale: Hurts a little bit Pain Location: R BKA site Pain Descriptors / Indicators: Guarding Pain Intervention(s): Limited activity within patient's tolerance;Monitored during session;Premedicated before session;Repositioned    Home Living                      Prior Function            PT Goals (current goals can now be found in the care plan section) Acute Rehab PT Goals Patient Stated Goal: to get stronger    Frequency    Min 2X/week      PT Plan Current plan remains appropriate    Co-evaluation  AM-PAC PT "6 Clicks" Mobility   Outcome Measure  Help needed turning from your back to your side while in a flat bed without using bedrails?: None Help needed moving from lying on your back to sitting on the side of a flat bed without using bedrails?: A Little Help needed moving to and from a bed to a chair (including a wheelchair)?: Total Help needed standing up from a chair using your arms (e.g., wheelchair or bedside chair)?: Total Help needed to walk in hospital room?: Total Help needed climbing 3-5 steps with a railing? : Total 6 Click Score: 11    End of Session   Activity Tolerance: Patient tolerated treatment well Patient left: in bed;with call bell/phone within reach;with bed alarm set Nurse Communication: Mobility status;Patient requests pain meds;Need for lift equipment PT Visit Diagnosis: Other abnormalities of gait and mobility (R26.89);Muscle weakness (generalized) (M62.81)     Time: [id number] PT Time Calculation (min) (ACUTE ONLY): 11 min  Charges:   $Therapeutic Exercise: 8-22 mins                  Ramond Dial 11/03/2020, 2:08 PM  Mee Hives, PT MS Acute Rehab Dept. Number: [place]

## 2020-11-03 NOTE — TOC Transition Note (Signed)
Transition of Care [facility]) - CM/SW Discharge Note   [name] MRN: [id number] Date of Birth: 08/27/1970  Transition of Care [facility]) CM/SW Contact:  Milinda Antis, [place] Phone Number: 11/03/2020, 12:02 PM   Clinical Narrative:    Patient will DC to: [facility] Anticipated DC date: 11/03/2020 Family notified: Yes Transport by: Corey Harold   Per MD patient ready for DC to SNF. RN to call report prior to discharge [phone]. RN, patient, patient's family, and facility notified of DC. Discharge Summary and FL2 sent to facility. DC packet on chart. Ambulance transport requested for patient.   CSW will sign off for now as social work intervention is no longer needed. Please consult [place] again if new needs arise.     Final next level of care: Skilled Nursing Facility Barriers to Discharge: Barriers Resolved   Patient Goals and CMS Choice Patient states their goals for this hospitalization and ongoing recovery are:: "be able to walk" CMS Medicare.gov Compare Post Acute Care list provided to::  (NA-pt wants to return to [name]) Choice offered to / list presented to : NA  Discharge Placement   Existing PASRR number confirmed : 11/01/20          Patient chooses bed at: [facility] Patient to be transferred to facility by: [place] Name of family member notified: Katrina Stack (Sister)   [phone] Patient and family notified of of transfer: 11/03/20  Discharge Plan and Services In-house Referral: Clinical Social Work   Post Acute Care Choice: [place]                               Social Determinants of Health (SDOH) Interventions     Readmission Risk Interventions No flowsheet data found.

## 2020-11-03 NOTE — Progress Notes (Signed)
MEDICINE CONSULT PROGRESS NOTE  Fred [name]  [id number] DOB: 1970-11-12 DOA: 10/29/2020 PCP: Merryl Hacker, No Primary/Referring Service: Dr. Sharol Given, Orthopedics Brief Narrative: Fred [name] is a 50 y.o. male with a history of ESRD, T2DM, labile HTN who was admitted from [facility] for transtibial amputation on the right leg as treatment for right hindfoot ulceration, abscess, and osteomyelitis. This was performed, and routine HD has been overseen by nephrology. He was discharged back to [facility] 6/13, though this was held up due to elevated BP. Hospitalists were consulted, restarted norvasc, increased dose to '10mg'$ , and recommended hydralazine prn which has improved BP.  Subjective: "I feel normal. My blood pressure has run high since I was a little kid." He has no complaints, specifically denying chest pain, dyspnea, palpitations, leg swelling, HA, vision changes.   Objective: BP (!) 185/102 (BP Location: Left Arm) Comment: RN notified  Pulse 86   Temp 97.7 F (36.5 C) (Oral)   Resp 17   Ht '6\' 6"'$  (1.981 m)   Wt 110.6 kg   SpO2 100%   BMI 28.18 kg/m   BP Readings from Last 3 Encounters:  11/03/20 (!) 185/102  10/04/20 111/77  07/16/20 (!) 166/104    Gen: No distress Pulm: Clear and nonlabored CV: RRR, no murmur, no JVD, no edema GI: +BS, NT, ND Neuro: Alert, oriented, no focal deficits.  Ext: R TTA without complication.  Skin: Right TDC c/d/i  Assessment & Plan: Active Problems:   ESRD on dialysis [facility])   Subacute osteomyelitis, right ankle and foot ([facility])   Abscess of bursa of right ankle   Hypertension, uncontrolled  HTN: Historically labile. Given patient remains asymptomatic, I do not believe high blood pressure alone should be a barrier to discharge.  - Recommend continuing norvasc at '10mg'$  dose daily (updated DC med rec) and continuing with prn hydralazine dosing for BP sustained >180/166mHg.   - D/w RN. No prn hydralazine had been given at time of last BP reading. She had  already given the prn's and will update VS after hydralazine.  T2DM: HbA1c is 4.8% ?if this is posttransfusion or otherwise inaccurate.  - Would decrease basal insulin as he's requiring very little SSI and running low this AM. I amended DC medication rec.  Right hindfoot osteomyelitis, abscess: s/p transtibial amputation 6/10.  - Postoperative management per primary.   RPatrecia Pour MD [facility] [email or web address] 11/03/2020, 9:57 AM

## 2020-11-03 NOTE — TOC Progression Note (Signed)
Transition of Care [facility]) - Progression Note    [name] MRN: [id number] Date of Birth: 03-10-1971  [facility]) CM/SW Contact  Milinda Antis, LCSWA Phone Number: 11/03/2020, 11:45 AM  Clinical Narrative:    CSW received a call from, Cori Razor, the administrator wit [facility].  They can accept the patient today.  CSW contacted [facility] and requested updated D/C summary.    Expected Discharge Plan: [place] Barriers to Discharge: Barriers Resolved  Expected Discharge Plan and Services Expected Discharge Plan: [place] In-house Referral: Clinical Social Work   Post Acute Care Choice: [place] Living arrangements for the past 2 months: [place] Expected Discharge Date: 11/03/20                                     Social Determinants of Health (SDOH) Interventions    Readmission Risk Interventions No flowsheet data found.

## 2020-11-04 DIAGNOSIS — Z89511 Acquired absence of right leg below knee: Secondary | ICD-10-CM | POA: Insufficient documentation

## 2020-11-09 ENCOUNTER — Telehealth: Payer: Self-pay | Admitting: Physician Assistant

## 2020-11-09 NOTE — Telephone Encounter (Signed)
-----   Message from Pamella Pert, [place] sent at 11/08/2020  4:37 PM EDT ----- Regarding: FW: BKA It looks like this pt d/c to [name] nursing home on 11/03/2020 and he was supposed to have a one week follow up but is not scheduled until 11/17/20. Can you call and see if we can move him up to this Wednesday, Thursday or Friday? Thanks! ----- Message ----- From: Pamella Pert, RMA Sent: 11/03/2020   8:45 AM EDT To: Pamella Pert, RMA Subject: FW: BKA                                        Pt should D/C to [facility] today will follow and male appt for one week follow up.  ----- Message ----- From: Pamella Pert, RMA Sent: 10/25/2020  11:19 AM EDT To: Pamella Pert, RMA Subject: BKA                                            Track this pt BKA surgery

## 2020-11-09 NOTE — Telephone Encounter (Signed)
I called [facility] to move appt up and they stated pt is having an episode right now so they will give me a CB to try and resched. that appt.

## 2020-11-10 NOTE — Telephone Encounter (Signed)
Multiple attempts made/messages left to reach nurse at [facility] to schedule left arm fistula surgery if PICC line has been removed.   Today, spoke with nurse Suanne Marker who advised pt no longer has PICC line and would need to speak with transportation to arrange surgery, then fax instructions to [phone]. Spoke with Rhonda/transportation who is not available during the times offered. Will call transportation back to discuss other options once available. Voiced understanding.

## 2020-11-10 NOTE — Telephone Encounter (Signed)
Appt made for 11/12/20 pt had dialysis on 11/11/20.

## 2020-11-12 ENCOUNTER — Other Ambulatory Visit: Payer: Self-pay

## 2020-11-12 ENCOUNTER — Encounter: Payer: Self-pay | Admitting: Family

## 2020-11-12 ENCOUNTER — Ambulatory Visit (INDEPENDENT_AMBULATORY_CARE_PROVIDER_SITE_OTHER): Payer: Medicare Other | Admitting: Family

## 2020-11-12 DIAGNOSIS — Z89511 Acquired absence of right leg below knee: Secondary | ICD-10-CM

## 2020-11-12 NOTE — Progress Notes (Signed)
   Post-Op Visit Note   Patient: Fred [name]           Date of Birth: 11/02/1970           MRN: [id number] Visit Date: 11/12/2020 PCP: Pcp, No  Chief Complaint:  Chief Complaint  Patient presents with   Right Knee - Routine Post Op    HPI:  HPI The patient is a 50 year old woman seen status post right below-knee amputation. wound VAC removed today.  No concerns from patient. Ortho Exam On examination of his right residual limb incision well approximated with staples there is no gaping no drainage no erythema very minimal edema Visit Diagnoses:  1. History of right below knee amputation ([place])     Plan: Begin daily Dial soap cleansing.  Dry dressing changes.  Continue with shrinker and limb protector.  Follow-up in 2 weeks for suture removal.  Follow-Up Instructions: Return in about 2 weeks (around 11/26/2020).   Imaging: No results found.  Orders:  No orders of the defined types were placed in this encounter.  No orders of the defined types were placed in this encounter.    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   Avascular necrosis of first metatarsal, left foot ([place]) 04/30/2020   Osteomyelitis of dorsal first metatarsal, left foot ([place]) 04/30/2020   Pressure injury of sacral region, unstageable ([place]) 04/30/2020   Morbid obesity ([place]) 04/30/2020   Multiple open wounds of foot    Insulin dependent type 2 diabetes mellitus ([place]) 04/29/2020   HTN (hypertension) 04/29/2020   History of anemia due to chronic kidney disease 04/29/2020    Sacral osteomyelitis ([place]) 04/29/2020   Pressure injury of both heels, unstageable ([place]) 04/29/2020   Pneumonia due to COVID-19 virus 04/29/2020   ESRD (end stage renal disease) ([place]) 04/29/2020   Past Medical History:  Diagnosis Date   Anemia    takes iron pills   Chronic kidney disease    HD MWF   Diabetes mellitus without complication (HCC)    type 2   Dyspnea    SOB - uses inhaler   GERD (gastroesophageal reflux disease)    Hypertension     History reviewed. No pertinent family history.  Past Surgical History:  Procedure Laterality Date   AMPUTATION Right 10/29/2020   Procedure: RIGHT BELOW KNEE AMPUTATION;  Surgeon: Newt Minion, MD;  Location: [place];  Service: Orthopedics;  Laterality: Right;   CHOLECYSTECTOMY     Social History   Occupational History   Not on file  Tobacco Use   Smoking status: Never   Smokeless tobacco: Never  Vaping Use   Vaping Use: Never used  Substance and Sexual Activity   Alcohol use: Never   Drug use: Never   Sexual activity: Not on file

## 2020-11-16 NOTE — Anesthesia Preprocedure Evaluation (Addendum)
Anesthesia Evaluation  Patient identified by MRN, date of birth, ID band Patient awake    Reviewed: Allergy & Precautions, NPO status , Patient's Chart, lab work & pertinent test results  Airway Mallampati: III  TM Distance: >3 FB Neck ROM: Full    Dental no notable dental hx.    Pulmonary neg pulmonary ROS,    Pulmonary exam normal breath sounds clear to auscultation       Cardiovascular hypertension, Pt. on medications Normal cardiovascular exam Rhythm:Regular Rate:Normal     Neuro/Psych negative neurological ROS  negative psych ROS   GI/Hepatic Neg liver ROS, GERD  Medicated and Controlled,  Endo/Other  diabetes, Insulin Dependent  Renal/GU ESRFRenal disease     Musculoskeletal negative musculoskeletal ROS (+)   Abdominal (+) + obese,   Peds  Hematology  (+) anemia ,   Anesthesia Other Findings End Stage Renal Disease  Reproductive/Obstetrics                            Anesthesia Physical Anesthesia Plan  ASA: 3  Anesthesia Plan: General   Post-op Pain Management:    Induction: Intravenous  PONV Risk Score and Plan: 2 and Ondansetron, Dexamethasone and Treatment may vary due to age or medical condition  Airway Management Planned: LMA  Additional Equipment:   Intra-op Plan:   Post-operative Plan: Extubation in OR  Informed Consent: I have reviewed the patients History and Physical, chart, labs and discussed the procedure including the risks, benefits and alternatives for the proposed anesthesia with the patient or authorized representative who has indicated his/her understanding and acceptance.     Dental advisory given  Plan Discussed with: CRNA  Anesthesia Plan Comments: (Reviewed PAT note written 11/16/2020 by [name], PA-C. )       Anesthesia Quick Evaluation

## 2020-11-16 NOTE — Progress Notes (Addendum)
Patient is a resident at [facility].  Spoke with [facility] LPN to review instructions for surgery and medications to take.  Instructions faxed to [phone]  Pt requested to be reviewed by anesthesia.  Same Day Workup INSTRUCTIONS:  Your procedure is scheduled on November 17, 2020. Please report to [facility] Main Entrance "A" at 09:00 A.M., and check in at the Admitting office. Call this number if you have problems the morning of surgery: [phone]   Remember: Do not eat or drink after midnight the night before your surgery   Medications to take morning of surgery with a sip of water include: Citalopram (Celexa) Pantoprazole (Protonix)  If needed: Acetaminophen (Tylenol) Albuterol (VENTOLIN HFA) inhaler Ondansetron (Zofran) Oxycodone (OXY IR)  As of today, STOP taking any Aspirin (unless otherwise instructed by your surgeon), Aleve, Naproxen, Ibuprofen, Motrin, Advil, Goody's, BC's, all herbal medications, fish oil, and all vitamins.  WHAT DO I DO ABOUT MY DIABETES MEDICATION?   Do not take oral diabetes medicines (pills) the morning of surgery.  THE NIGHT BEFORE SURGERY, take 1/2 of your normal dose of insulin glargine (LANTUS) insulin (5 units).   If your CBG is greater than 220 mg/dL, you may take  of your sliding scale (correction) dose of insulin.   HOW TO MANAGE YOUR DIABETES BEFORE AND AFTER SURGERY  Why is it important to control my blood sugar before and after surgery? Improving blood sugar levels before and after surgery helps healing and can limit problems. A way of improving blood sugar control is eating a healthy diet by:  Eating less sugar and carbohydrates  Increasing activity/exercise  Talking with your doctor about reaching your blood sugar goals High blood sugars (greater than 180 mg/dL) can raise your risk of infections and slow your recovery, so you will need to focus on controlling your diabetes during the weeks before surgery. Make sure that the  doctor who takes care of your diabetes knows about your planned surgery including the date and location.  How do I manage my blood sugar before surgery? Check your blood sugar at least 4 times a day, starting 2 days before surgery, to make sure that the level is not too high or low.  Check your blood sugar the morning of your surgery when you wake up and every 2 hours until you get to the Short Stay unit.  If your blood sugar is less than 70 mg/dL, you will need to treat for low blood sugar: Do not take insulin. Treat a low blood sugar (less than 70 mg/dL) with  cup of clear juice (cranberry or apple), 4 glucose tablets, OR glucose gel. Recheck blood sugar in 15 minutes after treatment (to make sure it is greater than 70 mg/dL). If your blood sugar is not greater than 70 mg/dL on recheck, call [phone] for further instructions. Report your blood sugar to the short stay nurse when you get to Short Stay.  If you are admitted to the hospital after surgery: Your blood sugar will be checked by the staff and you will probably be given insulin after surgery (instead of oral diabetes medicines) to make sure you have good blood sugar levels. The goal for blood sugar control after surgery is 80-180 mg/dL.    The Morning of Surgery Do not wear jewelry Do not wear lotions, powders, or perfumes/colognes, or deodorant Do not shave 48 hours prior to surgery.   Men may shave face and neck. Do not bring valuables to the hospital. [facility] is  not responsible for any belongings or valuables.  If you are a smoker, DO NOT Smoke 24 hours prior to surgery If you wear a CPAP at night please bring your mask the morning of surgery  Remember that you must have someone to transport you home after your surgery, and remain with you for 24 hours if you are discharged the same day.  Please bring cases for contacts, glasses, hearing aids, dentures or bridgework because it cannot be worn into surgery.    Patients discharged the day of surgery will not be allowed to drive home.   Please shower the NIGHT BEFORE/MORNING OF SURGERY (use antibacterial soap like DIAL soap if possible). Wear comfortable clothes the morning of surgery. Oral Hygiene is also important to reduce your risk of infection.  Remember - BRUSH YOUR TEETH THE MORNING OF SURGERY WITH YOUR REGULAR TOOTHPASTE  Patient denies shortness of breath, fever, cough and chest pain.

## 2020-11-16 NOTE — Progress Notes (Signed)
Anesthesia Chart Review: Fred [name]  Case: [id number] Date/Time: 11/17/20 1115   Procedure: LEFT ARM ARTERIOVENOUS (AV) FISTULA CREATION (Left)   Anesthesia type: Choice   Pre-op diagnosis: ESRD   Location: [facility] OR ROOM 16 / [facility]   Surgeons: Serafina Mitchell, MD       DISCUSSION: Patient is a 50 year old male scheduled for the above procedure. Patient had vascular surgery evaluation 10/04/20 for permanent HD access, but was on long term antibiotics via PICC at that time for a sacral decubitus and right heal ulcer. Dr. Trula Slade felt PICC line should be removed prior to undergoing AVF and also thought patient may require right BKA and possible toe amputation for left toe ulcerations. Since then [name] was evaluated by Fred Score, MD and underwent right BKA on 10/29/20.   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   Admission 10/29/20-11/03/20 for right BKA. Nephrology managed ESRD. IM consulted for uncontrolled HTN. PT worked with mobility.  Admission 04/29/20-06/06/20 with dyspnea and hypoxia (O2 sat 80's requiring short term 3L O2), recent + COVID-19 test, bilateral foot wounds and a stage IV sacral ulcer.  Covid was treated with remdesivir and Decadron. O2 weaned. General surgery and orthopedics were consulted for sacral wound his extremity osteomyelitis/gangrene. No indication for surgery. Sacral wound was treated with hydrotherapy and chemical debridement. Wound care for wounds on feet and offloading with air mattresses PRAFO boots. Nephrology managed HD. PT/OT worked with him for deconditioning. He was discharged to [facility] 06/06/20-07/16/20 for ongoing therapy for critical illness myopathy and wound care. Discharged back to [facility].    Anesthesia team to evaluate on the day of surgery.   VS:  BP Readings from  Last 3 Encounters:  11/03/20 (!) 115/95  10/04/20 111/77  07/16/20 (!) 166/104   Pulse Readings from Last 3 Encounters:  11/03/20 89  10/04/20 98  07/16/20 91     PROVIDERS: Pcp, No   LABS: For ISTAT day of surgery. As of 11/02/20, H/H 7.5/26/4 (post right BKA), Cr 5.42. A1c 5.6% 05/04/20.     IMAGES: 1V PCXR 07/08/20: FINDINGS: Right IJ approach dialysis catheter is unchanged. Lungs are clear. Heart size is normal. No pneumothorax or pleural fluid. No acute or focal bony abnormality. IMPRESSION: No acute disease.     EKG: 04/29/20: ST at 103 bpm     CV: Echo 02/13/20 ([facility]): SUMMARY  The left ventricular size is normal.  Moderate left ventricular hypertrophy  There is moderate concentric left ventricular hypertrophy.  Upper septal hypertrophy (sigmoid septum), normal variant.  LV ejection fraction = 60-65%.  Overall left ventricular function appears to be normal .  Left ventricular filling pattern is pseudonormal.  No segmental wall motion abnormalities seen in the left ventricle  The right ventricle is normal in size and function.  The left atrium is mildly dilated.  The aortic valve is trileaflet.  There is mild mitral regurgitation.  The regurgitant jet is posteriorly-directed.  There is mild mitral annular calcification.  There is trace tricuspid regurgitation.  Borderline dilated inferior vena cava.  There is a pleural effusion present.  There is no pericardial effusion.  There is no comparison study available.  Clinical correlation is recommended.     Past Medical History:  Diagnosis Date   Anemia    takes iron pills   Chronic kidney disease  HD MWF   Diabetes mellitus without complication ([place])    type 2   Dyspnea    SOB - uses inhaler   GERD (gastroesophageal reflux disease)    Hypertension     Past Surgical History:  Procedure Laterality Date   AMPUTATION Right 10/29/2020   Procedure: RIGHT BELOW KNEE AMPUTATION;  Surgeon:  Newt Minion, MD;  Location: [place];  Service: Orthopedics;  Laterality: Right;   CHOLECYSTECTOMY      MEDICATIONS: No current facility-administered medications for this encounter.    acetaminophen (TYLENOL) 325 MG tablet   albuterol (VENTOLIN HFA) 108 (90 Base) MCG/ACT inhaler   Amino Acids-Protein Hydrolys (PRO-STAT SUGAR FREE PO)   amLODipine (NORVASC) 5 MG tablet   cadexomer iodine (IODOSORB) 0.9 % gel   Cholecalciferol (VITAMIN D3) 50 MCG (2000 UT) TABS   citalopram (CELEXA) 20 MG tablet   ferrous sulfate 325 (65 FE) MG tablet   insulin glargine (LANTUS) 100 UNIT/ML injection   lanthanum (FOSRENOL) 500 MG chewable tablet   melatonin 3 MG TABS tablet   midodrine (PROAMATINE) 10 MG tablet   multivitamin (RENA-VIT) TABS tablet   Nutritional Supplements (NEPRO/CARBSTEADY PO)   ondansetron (ZOFRAN) 4 MG tablet   oxyCODONE (OXY IR/ROXICODONE) 5 MG immediate release tablet   pantoprazole (PROTONIX) 40 MG tablet   promethazine (PHENERGAN) 25 MG suppository   promethazine (PHENERGAN) 25 MG/ML injection   traZODone (DESYREL) 50 MG tablet   vitamin C (ASCORBIC ACID) 500 MG tablet   oxyCODONE-acetaminophen (PERCOCET) 5-325 MG tablet    Fred Gianotti, PA-C Surgical Short Stay/Anesthesiology [facility] Phone [phone] [facility] Phone [phone] 11/16/2020 11:07 AM

## 2020-11-17 ENCOUNTER — Ambulatory Visit (HOSPITAL_COMMUNITY)
Admission: RE | Admit: 2020-11-17 | Discharge: 2020-11-17 | Disposition: A | Discharge status: Home / Self Care | Payer: Medicare Other | Attending: Vascular Surgery | Admitting: Vascular Surgery

## 2020-11-17 ENCOUNTER — Ambulatory Visit (HOSPITAL_COMMUNITY): Payer: Medicare Other | Admitting: Vascular Surgery

## 2020-11-17 ENCOUNTER — Encounter: Payer: Medicare Other | Admitting: Family

## 2020-11-17 ENCOUNTER — Encounter (HOSPITAL_COMMUNITY)
Admission: RE | Disposition: A | Discharge status: Home / Self Care | Payer: Self-pay | Source: Home / Self Care | Attending: Vascular Surgery

## 2020-11-17 ENCOUNTER — Other Ambulatory Visit: Payer: Self-pay

## 2020-11-17 ENCOUNTER — Encounter (HOSPITAL_COMMUNITY): Payer: Self-pay | Admitting: Vascular Surgery

## 2020-11-17 DIAGNOSIS — Z8616 Personal history of COVID-19: Secondary | ICD-10-CM | POA: Insufficient documentation

## 2020-11-17 DIAGNOSIS — E1122 Type 2 diabetes mellitus with diabetic chronic kidney disease: Secondary | ICD-10-CM | POA: Diagnosis not present

## 2020-11-17 DIAGNOSIS — Z89511 Acquired absence of right leg below knee: Secondary | ICD-10-CM | POA: Diagnosis not present

## 2020-11-17 DIAGNOSIS — I120 Hypertensive chronic kidney disease with stage 5 chronic kidney disease or end stage renal disease: Secondary | ICD-10-CM | POA: Insufficient documentation

## 2020-11-17 DIAGNOSIS — Z794 Long term (current) use of insulin: Secondary | ICD-10-CM | POA: Insufficient documentation

## 2020-11-17 DIAGNOSIS — Z992 Dependence on renal dialysis: Secondary | ICD-10-CM | POA: Insufficient documentation

## 2020-11-17 DIAGNOSIS — K219 Gastro-esophageal reflux disease without esophagitis: Secondary | ICD-10-CM | POA: Insufficient documentation

## 2020-11-17 DIAGNOSIS — N186 End stage renal disease: Secondary | ICD-10-CM | POA: Insufficient documentation

## 2020-11-17 HISTORY — PX: AV FISTULA PLACEMENT: SHX1204

## 2020-11-17 LAB — POCT I-STAT, CHEM 8
BUN: 29 mg/dL — ABNORMAL HIGH (ref 6–20)
Calcium, Ion: 1.15 mmol/L (ref 1.15–1.40)
Chloride: 99 mmol/L (ref 98–111)
Creatinine, Ser: 4.4 mg/dL — ABNORMAL HIGH (ref 0.61–1.24)
Glucose, Bld: 110 mg/dL — ABNORMAL HIGH (ref 70–99)
HCT: 37 % — ABNORMAL LOW (ref 39.0–52.0)
Hemoglobin: 12.6 g/dL — ABNORMAL LOW (ref 13.0–17.0)
Potassium: 4.4 mmol/L (ref 3.5–5.1)
Sodium: 137 mmol/L (ref 135–145)
TCO2: 28 mmol/L (ref 22–32)

## 2020-11-17 LAB — GLUCOSE, CAPILLARY
Glucose-Capillary: 116 mg/dL — ABNORMAL HIGH (ref 70–99)
Glucose-Capillary: 119 mg/dL — ABNORMAL HIGH (ref 70–99)

## 2020-11-17 SURGERY — ARTERIOVENOUS (AV) FISTULA CREATION
Anesthesia: General | Laterality: Left

## 2020-11-17 MED ORDER — CHLORHEXIDINE GLUCONATE 4 % EX LIQD: 60.0000 mL | Freq: Once | CUTANEOUS | Status: DC

## 2020-11-17 MED ORDER — HEPARIN 6000 UNIT IRRIGATION SOLUTION
Status: DC | PRN
  Administered 2020-11-17: 1

## 2020-11-17 MED ORDER — LIDOCAINE HCL (PF) 0.5 % IJ SOLN
INTRAMUSCULAR | Status: AC
  Filled 2020-11-17: qty 50

## 2020-11-17 MED ORDER — LABETALOL HCL 5 MG/ML IV SOLN
INTRAVENOUS | Status: AC
  Filled 2020-11-17: qty 4

## 2020-11-17 MED ORDER — LABETALOL HCL 5 MG/ML IV SOLN
5.0000 mg | Freq: Once | INTRAVENOUS | Status: AC
  Administered 2020-11-17: 5 mg via INTRAVENOUS

## 2020-11-17 MED ORDER — EPHEDRINE SULFATE-NACL 50-0.9 MG/10ML-% IV SOSY
PREFILLED_SYRINGE | INTRAVENOUS | Status: DC | PRN
  Administered 2020-11-17: 5 mg via INTRAVENOUS

## 2020-11-17 MED ORDER — LABETALOL HCL 5 MG/ML IV SOLN
INTRAVENOUS | Status: AC
  Administered 2020-11-17: 5 mg via INTRAVENOUS
  Filled 2020-11-17: qty 4

## 2020-11-17 MED ORDER — LIDOCAINE 2% (20 MG/ML) 5 ML SYRINGE
INTRAMUSCULAR | Status: DC | PRN
  Administered 2020-11-17: 60 mg via INTRAVENOUS

## 2020-11-17 MED ORDER — PHENYLEPHRINE 40 MCG/ML (10ML) SYRINGE FOR IV PUSH (FOR BLOOD PRESSURE SUPPORT)
PREFILLED_SYRINGE | INTRAVENOUS | Status: DC | PRN
  Administered 2020-11-17: 80 ug via INTRAVENOUS

## 2020-11-17 MED ORDER — CHLORHEXIDINE GLUCONATE 0.12 % MT SOLN: 15.0000 mL | Freq: Once | OROMUCOSAL | Status: AC

## 2020-11-17 MED ORDER — PHENYLEPHRINE HCL-NACL 10-0.9 MG/250ML-% IV SOLN
INTRAVENOUS | Status: DC | PRN
  Administered 2020-11-17: 20 ug/min via INTRAVENOUS

## 2020-11-17 MED ORDER — DEXAMETHASONE SODIUM PHOSPHATE 10 MG/ML IJ SOLN
INTRAMUSCULAR | Status: DC | PRN
  Administered 2020-11-17: 5 mg via INTRAVENOUS

## 2020-11-17 MED ORDER — ONDANSETRON HCL 4 MG/2ML IJ SOLN
INTRAMUSCULAR | Status: DC | PRN
  Administered 2020-11-17: 4 mg via INTRAVENOUS

## 2020-11-17 MED ORDER — FENTANYL CITRATE (PF) 250 MCG/5ML IJ SOLN
INTRAMUSCULAR | Status: AC
  Filled 2020-11-17: qty 5

## 2020-11-17 MED ORDER — VANCOMYCIN HCL IN DEXTROSE 1-5 GM/200ML-% IV SOLN
INTRAVENOUS | Status: AC
  Administered 2020-11-17: 1000 mg via INTRAVENOUS
  Filled 2020-11-17: qty 200

## 2020-11-17 MED ORDER — ORAL CARE MOUTH RINSE: 15.0000 mL | Freq: Once | OROMUCOSAL | Status: AC

## 2020-11-17 MED ORDER — VANCOMYCIN HCL IN DEXTROSE 1-5 GM/200ML-% IV SOLN: 1000.0000 mg | INTRAVENOUS | Status: AC

## 2020-11-17 MED ORDER — PROPOFOL 10 MG/ML IV BOLUS
INTRAVENOUS | Status: DC | PRN
  Administered 2020-11-17: 200 mg via INTRAVENOUS

## 2020-11-17 MED ORDER — SODIUM CHLORIDE 0.9 % IV SOLN: INTRAVENOUS | Status: DC

## 2020-11-17 MED ORDER — LIDOCAINE-EPINEPHRINE 1 %-1:100000 IJ SOLN
INTRAMUSCULAR | Status: AC
  Filled 2020-11-17: qty 1

## 2020-11-17 MED ORDER — LABETALOL HCL 5 MG/ML IV SOLN: 5.0000 mg | Freq: Once | INTRAVENOUS | Status: AC

## 2020-11-17 MED ORDER — FENTANYL CITRATE (PF) 250 MCG/5ML IJ SOLN
INTRAMUSCULAR | Status: DC | PRN
  Administered 2020-11-17 (×5): 25 ug via INTRAVENOUS

## 2020-11-17 MED ORDER — CHLORHEXIDINE GLUCONATE 0.12 % MT SOLN
OROMUCOSAL | Status: AC
  Administered 2020-11-17: 15 mL via OROMUCOSAL
  Filled 2020-11-17: qty 15

## 2020-11-17 MED ORDER — 0.9 % SODIUM CHLORIDE (POUR BTL) OPTIME
TOPICAL | Status: DC | PRN
  Administered 2020-11-17: 1000 mL

## 2020-11-17 SURGICAL SUPPLY — 26 items
ARMBAND PINK RESTRICT EXTREMIT (MISCELLANEOUS) ×4 IMPLANT
BAG COUNTER SPONGE SURGICOUNT (BAG) ×2 IMPLANT
CANISTER SUCT 3000ML PPV (MISCELLANEOUS) ×2 IMPLANT
CANNULA VESSEL 3MM 2 BLNT TIP (CANNULA) ×4 IMPLANT
CLIP LIGATING EXTRA MED SLVR (CLIP) ×2 IMPLANT
CLIP LIGATING EXTRA SM BLUE (MISCELLANEOUS) ×2 IMPLANT
COVER PROBE W GEL 5X96 (DRAPES) ×2 IMPLANT
DECANTER SPIKE VIAL GLASS SM (MISCELLANEOUS) ×2 IMPLANT
DERMABOND ADVANCED (GAUZE/BANDAGES/DRESSINGS) ×1
DERMABOND ADVANCED .7 DNX12 (GAUZE/BANDAGES/DRESSINGS) ×1 IMPLANT
ELECT REM PT RETURN 9FT ADLT (ELECTROSURGICAL) ×2
ELECTRODE REM PT RTRN 9FT ADLT (ELECTROSURGICAL) ×1 IMPLANT
GLOVE SURG MICRO LTX SZ7.5 (GLOVE) ×2 IMPLANT
GOWN STRL REUS W/ TWL LRG LVL3 (GOWN DISPOSABLE) ×3 IMPLANT
GOWN STRL REUS W/TWL LRG LVL3 (GOWN DISPOSABLE) ×3
KIT BASIN OR (CUSTOM PROCEDURE TRAY) ×2 IMPLANT
KIT TURNOVER KIT B (KITS) ×2 IMPLANT
NS IRRIG 1000ML POUR BTL (IV SOLUTION) ×2 IMPLANT
PACK CV ACCESS (CUSTOM PROCEDURE TRAY) ×2 IMPLANT
PAD ARMBOARD 7.5X6 YLW CONV (MISCELLANEOUS) ×4 IMPLANT
SUT PROLENE 6 0 CC (SUTURE) ×6 IMPLANT
SUT VIC AB 3-0 SH 27 (SUTURE) ×1
SUT VIC AB 3-0 SH 27X BRD (SUTURE) ×1 IMPLANT
TOWEL GREEN STERILE (TOWEL DISPOSABLE) ×2 IMPLANT
UNDERPAD 30X36 HEAVY ABSORB (UNDERPADS AND DIAPERS) ×2 IMPLANT
WATER STERILE IRR 1000ML POUR (IV SOLUTION) ×2 IMPLANT

## 2020-11-17 NOTE — H&P (Signed)
Office Visit  10/04/2020 [facility] -Verlene Mayer, MD  Vascular Surgery ESRD (end stage renal disease) on dialysis [facility]) +1 more  Dx New Patient (Initial Visit); Referred by Rosita Fire, MD  Reason for Visit    Additional Documentation  Vitals:  BP 111/77 (BP Location: Right Arm, Patient Position: Sitting, Cuff Size: Large) Pulse 98 Temp 97.2 F (36.2 C) Important  Resp 20 Ht '6\' 6"'$  (1.981 m) Wt 111.1 kg SpO2 96% BMI 28.31 kg/m BSA 2.47 m  Flowsheets:  Clinical Intake, Vital Signs, NEWS, MEWS Score, Anthropometrics, Method of Visit   Encounter Info:  Billing Info, History, Allergies, Detailed Report     Orthostatic Vitals Recorded in This Encounter   10/04/2020  1143     Patient Position: Sitting  BP Location: Right Arm  Cuff Size: Large    All Notes    Progress Notes by Serafina Mitchell, MD at 10/04/2020 12:00 PM  Author: Serafina Mitchell, MD Author Type: Physician Filed: 10/04/2020 12:56 PM  Note Status: Signed Cosign: Cosign Not Required Encounter Date: 10/04/2020  Editor: Serafina Mitchell, MD (Physician)                                                 [facility]   Patient name: Tyrel Vandruff  MRN: [id number]        DOB: 1971-03-09        Sex: male     REQUESTING PROVIDER:     Dr. Carolin Sicks     REASON FOR CONSULT:    HD access   HISTORY OF PRESENT ILLNESS:    Xiong Coutee is a 50 y.o. male, who is referred for dialysis access.  His renal failure secondary to diabetes and hypertension.  He has a catheter in place.  He is a Tuesday Thursday dialysis patient.  He does not have a pacemaker or defibrillator.   Patient has a history of COVID.  This was complicated by sacral decubitus as well as right heel ulcer.  He has been evaluated by Dr. Lucky Cowboy in the past.  There is concern of her osteomyelitis.  He has been on chronic antibiotics via a left-sided PICC line  which is currently not being used.         PAST MEDICAL HISTORY          Past Medical History:  Diagnosis Date   Chronic kidney disease      HD MWF   Diabetes mellitus without complication ([place])     Hypertension          FAMILY HISTORY    History reviewed. No pertinent family history.   SOCIAL HISTORY:    Social History         Socioeconomic History   Marital status: Single      Spouse name: Not on file   Number of children: Not on file   Years of education: Not on file   Highest education level: Not on file  Occupational History   Not on file  Tobacco Use   Smoking status: Never Smoker   Smokeless tobacco: Never Used  Vaping Use   Vaping Use: Never used  Substance and Sexual Activity   Alcohol use: Never   Drug use: Never   Sexual activity: Not on  file  Other Topics Concern   Not on file  Social History Narrative   Not on file    Social Determinants of Health    Financial Resource Strain: Not on file  Food Insecurity: Not on file  Transportation Needs: Not on file  Physical Activity: Not on file  Stress: Not on file  Social Connections: Not on file  Intimate Partner Violence: Not on file      ALLERGIES:           Allergies  Allergen Reactions   Atorvastatin Hives      NOT on MAR   Trazodone Hcl        Other reaction(s): Confusion (intolerance)      CURRENT MEDICATIONS:            Current Outpatient Medications  Medication Sig Dispense Refill   acetaminophen (TYLENOL) 325 MG tablet Take 650 mg by mouth every 6 (six) hours as needed for mild pain or fever (>101 F).       albuterol (VENTOLIN HFA) 108 (90 Base) MCG/ACT inhaler Inhale 1-2 puffs into the lungs every 4 (four) hours as needed for wheezing or shortness of breath.       amLODipine (NORVASC) 5 MG tablet Take 1 tablet (5 mg total) by mouth at bedtime.       citalopram (CELEXA) 20 MG tablet Take 1 tablet (20 mg total) by mouth daily.       collagenase (SANTYL) ointment Apply  topically daily. 15 g 0   insulin glargine (LANTUS) 100 UNIT/ML injection Inject 0.11 mLs (11 Units total) into the skin at bedtime. 10 mL 11   insulin lispro (HUMALOG) 100 UNIT/ML KwikPen Inject into the skin.       lanthanum (FOSRENOL) 500 MG chewable tablet Chew 1 tablet (500 mg total) by mouth 3 (three) times daily with meals.       LEVEMIR FLEXTOUCH 100 UNIT/ML FlexPen Inject into the skin.       multivitamin (RENA-VIT) TABS tablet Take 1 tablet by mouth daily.       ondansetron (ZOFRAN) 4 MG tablet Take 1 tablet (4 mg total) by mouth every 6 (six) hours as needed for nausea. 20 tablet 0   pantoprazole (PROTONIX) 40 MG tablet Take 1 tablet (40 mg total) by mouth daily at 6 (six) AM.        No current facility-administered medications for this visit.      REVIEW OF SYSTEMS:    '[X]'$  denotes positive finding, '[ ]'$  denotes negative finding Cardiac   Comments:  Chest pain or chest pressure:      Shortness of breath upon exertion:      Short of breath when lying flat:      Irregular heart rhythm:             Vascular      Pain in calf, thigh, or hip brought on by ambulation:      Pain in feet at night that wakes you up from your sleep:      Blood clot in your veins:      Leg swelling:             Pulmonary      Oxygen at home:      Productive cough:      Wheezing:             Neurologic      Sudden weakness in arms or legs:      Sudden numbness in  arms or legs:      Sudden onset of difficulty speaking or slurred speech:      Temporary loss of vision in one eye:      Problems with dizziness:             Gastrointestinal      Blood in stool:        Vomited blood:             Genitourinary      Burning when urinating:      Blood in urine:             Psychiatric      Major depression:             Hematologic      Bleeding problems:      Problems with blood clotting too easily:             Skin      Rashes or ulcers: x           Constitutional      Fever or chills:         PHYSICAL EXAM:       Vitals:    10/04/20 1143  BP: 111/77  Pulse: 98  Resp: 20  Temp: (!) 97.2 F (36.2 C)  SpO2: 96%  Weight: 245 lb (111.1 kg)  Height: '6\' 6"'$  (1.981 m)      GENERAL: The patient is a well-nourished male, in no acute distress. The vital signs are documented above. CARDIAC: There is a regular rate and rhythm. VASCULAR: Nonpalpable pedal pulses with significant edema to the right leg PULMONARY: Nonlabored respirations ABDOMEN: Soft and non-tender with normal pitched bowel sounds. MUSCULOSKELETAL: There are no major deformities or cyanosis. NEUROLOGIC: No focal weakness or paresthesias are detected. SKIN: See photos below. PSYCHIATRIC: The patient has a normal affect.         STUDIES:    I have reviewed the following: +-----------------+-------------+----------+---------+  Right Cephalic   Diameter (cm)Depth (cm)Findings   +-----------------+-------------+----------+---------+  Shoulder             0.41        1.06              +-----------------+-------------+----------+---------+  Prox upper arm       0.49        0.76              +-----------------+-------------+----------+---------+  Mid upper arm        0.50        0.43   branching  +-----------------+-------------+----------+---------+  Dist upper arm       0.39        0.58              +-----------------+-------------+----------+---------+  Antecubital fossa    0.43        0.62   branching  +-----------------+-------------+----------+---------+  Prox forearm         0.40        0.55              +-----------------+-------------+----------+---------+  Mid forearm          0.36        0.37              +-----------------+-------------+----------+---------+  Dist forearm         0.32        0.23   branching  +-----------------+-------------+----------+---------+  Wrist  0.26        0.27               +-----------------+-------------+----------+---------+   +-----------------+-------------+----------+---------+  Right Basilic    Diameter (cm)Depth (cm)Findings   +-----------------+-------------+----------+---------+  Prox upper arm       0.42        1.22              +-----------------+-------------+----------+---------+  Mid upper arm        0.49        1.21   branching  +-----------------+-------------+----------+---------+  Dist upper arm       0.32        0.76              +-----------------+-------------+----------+---------+  Antecubital fossa    0.25        0.60              +-----------------+-------------+----------+---------+  Prox forearm         0.25        0.20              +-----------------+-------------+----------+---------+  Mid forearm          0.24        0.25              +-----------------+-------------+----------+---------+  Distal forearm       0.17        0.24              +-----------------+-------------+----------+---------+  Wrist                0.15        0.24              +-----------------+-------------+----------+---------+   +-----------------+-------------+----------+---------+  Left Cephalic    Diameter (cm)Depth (cm)Findings   +-----------------+-------------+----------+---------+  Shoulder             0.35        1.05              +-----------------+-------------+----------+---------+  Prox upper arm       0.46        0.76              +-----------------+-------------+----------+---------+  Mid upper arm        0.36        0.58   branching  +-----------------+-------------+----------+---------+  Dist upper arm       0.39        0.48   branching  +-----------------+-------------+----------+---------+  Antecubital fossa    0.44        0.27              +-----------------+-------------+----------+---------+  Prox forearm         0.31        0.63               +-----------------+-------------+----------+---------+  Mid forearm          0.30        0.44   branching  +-----------------+-------------+----------+---------+  Dist forearm         0.19        0.30              +-----------------+-------------+----------+---------+  Wrist                0.14        0.39              +-----------------+-------------+----------+---------+   +-----------------+-------------+----------+---------+  Left Basilic     Diameter (cm)Depth (cm)Findings   +-----------------+-------------+----------+---------+  Prox upper arm       0.56        0.96              +-----------------+-------------+----------+---------+  Mid upper arm        0.45        0.91              +-----------------+-------------+----------+---------+  Dist upper arm       0.51        0.93   branching  +-----------------+-------------+----------+---------+  Antecubital fossa    0.37        0.39              +-----------------+-------------+----------+---------+  Prox forearm         0.32        0.36   branching  +-----------------+-------------+----------+---------+  Mid forearm          0.32        0.36              +-----------------+-------------+----------+---------+  Distal forearm       0.24        0.27              +-----------------+-------------+----------+---------+  Wrist                0.18        0.27              +-----------------+-------------+----------+---------+     +--------+------------------+-----+--------+-------+  Left    Lt Pressure (mmHg)IndexWaveformComment  +--------+------------------+-----+--------+-------+  [id number]                                     +--------+------------------+-----+--------+-------+  PTA     0                 0.00 absent           +--------+------------------+-----+--------+-------+  DP      183               0.80 biphasic          +--------+------------------+-----+--------+-------+   ASSESSMENT and PLAN    End-stage renal disease: The patient is a good candidate for a left brachiocephalic fistula.  Currently, he has a right sided TDC and a PICC line on the left which is not being utilized.  He will need to have his PICC line removed before proceeding with brachiocephalic fistula.  It does not appear that the PICC line is in his cephalic vein.   Bilateral lower extremity ulcers, right greater than left: The patient appears to have adequate blood flow for wound healing.  He has seen Dr. Sharol Given in the past.  Based on the drainage and appearance of his heel ulcer, I suspect there is underlying osteomyelitis and that he is going to require a below-knee amputation.  I am also suspicious that he will need amputation of his left fourth and fifth toe including the ulcer on the lateral aspect of the left foot.  I am sending the patient back to see Dr. Sharol Given for second opinion.     Leia Alf, MD, FACS [facility] [phone] Pager [phone]        Addendum:  The patient has been re-examined and re-evaluated.  The patient's history and  physical has been reviewed and is unchanged.    Adams Menaker is a 50 y.o. male is being admitted with ESRD. All the risks, benefits and other treatment options have been discussed with the patient. The patient has consented to proceed with Procedure(s): LEFT ARM ARTERIOVENOUS (AV) FISTULA CREATION as a surgical intervention.  Jaylen Knope 11/17/2020 9:44 AM [facility]

## 2020-11-17 NOTE — Anesthesia Procedure Notes (Signed)
Procedure Name: LMA Insertion Date/Time: 11/17/2020 10:09 AM Performed by: [name], CRNA Pre-anesthesia Checklist: Patient identified, Emergency Drugs available, Suction available and Patient being monitored Patient Re-evaluated:Patient Re-evaluated prior to induction Oxygen Delivery Method: Circle System Utilized Preoxygenation: Pre-oxygenation with 100% oxygen Induction Type: IV induction Ventilation: Mask ventilation without difficulty LMA: LMA inserted LMA Size: 5.0 Number of attempts: 1 Placement Confirmation: positive ETCO2 Tube secured with: Tape Dental Injury: Teeth and Oropharynx as per pre-operative assessment

## 2020-11-17 NOTE — Op Note (Signed)
    OPERATIVE REPORT  DATE OF SURGERY: 11/17/2020  PATIENT: Fred [name], 50 y.o. male MRN: [id number]  DOB: 1971/04/11  PRE-OPERATIVE DIAGNOSIS: End-stage renal disease  POST-OPERATIVE DIAGNOSIS:  Same  PROCEDURE: Left brachiocephalic AV fistula creation  SURGEON:  Curt Jews, M.D.  PHYSICIAN ASSISTANT: Baglia PAC  The assistant was needed for exposure and to expedite the case  ANESTHESIA: General  EBL: per anesthesia record  Total I/O In: 400 [I.V.:400] Out: 20 [Blood:20]  BLOOD ADMINISTERED: none  DRAINS: none  SPECIMEN: none  COUNTS CORRECT:  YES  PATIENT DISPOSITION:  PACU - hemodynamically stable  PROCEDURE DETAILS: Patient was taken operating placed supine position where the area of the left arm was prepped and draped in sterile fashion.  The arm was visualized with SonoSite ultrasound.  This revealed good caliber cephalic vein at the antecubital space.  The cephalic vein was of good caliber in the proximal forearm but had multiple branches and became small throughout the distal forearm.  For this reason decision was made to place a brachiocephalic fistula.  Incision was made over the antecubital space and carried down to isolate the brachial artery which was of normal caliber.  There was moderate atherosclerotic change of the artery.  The cephalic vein was of good caliber.  Tributary branches were ligated with 3-0 silk ties and divided.  The vein was ligated distally and was mobilized to the level of the brachial artery.  The artery was occluded proximally and distally was opened with 11 blade sent longitudinally to Pott scissors.  The the vein was brought into approximation was cut to the appropriate length and was spatulated and sewn end-to-side to the artery with a running 6-0 Prolene suture.  Clamps removed and excellent thrill was noted.  The wounds were irrigated with saline.  Hemostased electrocautery.  The wounds were closed with 3-0 Vicryl in the subcutaneous  and subcuticular tissue.  Sterile dressing was applied and the patient was transferred to the recovery room in stable condition   Rosetta Posner, M.D., [facility] 11/17/2020 11:23 AM  Note: Portions of this report may have been transcribed using voice recognition software.  Every effort has been made to ensure accuracy; however, inadvertent computerized transcription errors may still be present.

## 2020-11-17 NOTE — Anesthesia Postprocedure Evaluation (Signed)
Anesthesia Post Note  Patient: [name]  Procedure(s) Performed: LEFT ARM ARTERIOVENOUS (AV) FISTULA CREATION (Left)     Patient location during evaluation: PACU Anesthesia Type: General Level of consciousness: awake Pain management: pain level controlled Vital Signs Assessment: post-procedure vital signs reviewed and stable Respiratory status: spontaneous breathing, nonlabored ventilation, respiratory function stable and patient connected to nasal cannula oxygen Cardiovascular status: blood pressure returned to baseline and stable Postop Assessment: no apparent nausea or vomiting Anesthetic complications: no   No notable events documented.  Last Vitals:  Vitals:   11/17/20 1145 11/17/20 1157  BP: (!) 167/84 (!) 153/53  Pulse: 85   Resp: 11   Temp:  (!) 36.1 C  SpO2: 98% 98%    Last Pain:  Vitals:   11/17/20 1157  TempSrc:   PainSc: 0-No pain                 Pepe Mineau P Kaveh Kissinger

## 2020-11-17 NOTE — Transfer of Care (Signed)
Immediate Anesthesia Transfer of Care Note  Patient: Fred [name]  Procedure(s) Performed: LEFT ARM ARTERIOVENOUS (AV) FISTULA CREATION (Left)  Patient Location: PACU  Anesthesia Type:General  Level of Consciousness: awake, alert  and patient cooperative  Airway & Oxygen Therapy: Patient Spontanous Breathing  Post-op Assessment: Report given to RN and Post -op Vital signs reviewed and stable  Post vital signs: Reviewed and stable  Last Vitals:  Vitals Value Taken Time  BP 157/67 11/17/20 1127  Temp    Pulse 90 11/17/20 1128  Resp 11 11/17/20 1128  SpO2 99 % 11/17/20 1128  Vitals shown include unvalidated device data.  Last Pain:  Vitals:   11/17/20 0841  TempSrc:   PainSc: 5       Patients Stated Pain Goal: 2 ([id number])  Complications: No notable events documented.

## 2020-11-17 NOTE — Discharge Instructions (Signed)
[facility]  Discharge Instructions  AV Fistula or Graft Surgery for Dialysis Access  Please refer to the following instructions for your post-procedure care. Your surgeon or physician assistant will discuss any changes with you.  Activity  You may drive the day following your surgery, if you are comfortable and no longer taking prescription pain medication. Resume full activity as the soreness in your incision resolves.  Bathing/Showering  You may shower after you go home. Keep your incision dry for 48 hours. Do not soak in a bathtub, hot tub, or swim until the incision heals completely. You may not shower if you have a hemodialysis catheter.  Incision Care  Clean your incision with mild soap and water after 48 hours. Pat the area dry with a clean towel. You do not need a bandage unless otherwise instructed. Do not apply any ointments or creams to your incision. You may have skin glue on your incision. Do not peel it off. It will come off on its own in about one week. Your arm may swell a bit after surgery. To reduce swelling use pillows to elevate your arm so it is above your heart. Your doctor will tell you if you need to lightly wrap your arm with an ACE bandage.  Diet  Resume your normal diet. There are not special food restrictions following this procedure. In order to heal from your surgery, it is CRITICAL to get adequate nutrition. Your body requires vitamins, minerals, and protein. Vegetables are the best source of vitamins and minerals. Vegetables also provide the perfect balance of protein. Processed food has little nutritional value, so try to avoid this.  Medications  Resume taking all of your medications. If your incision is causing pain, you may take over-the counter pain relievers such as acetaminophen (Tylenol). If you were prescribed a stronger pain medication, please be aware these medications can cause nausea and constipation. Prevent  nausea by taking the medication with a snack or meal. Avoid constipation by drinking plenty of fluids and eating foods with high amount of fiber, such as fruits, vegetables, and grains.  Do not take Tylenol if you are taking prescription pain medications.  Follow up Your surgeon may want to see you in the office following your access surgery. If so, this will be arranged at the time of your surgery.  Please call us immediately for any of the following conditions:  Increased pain, redness, drainage (pus) from your incision site Fever of 101 degrees or higher Severe or worsening pain at your incision site Hand pain or numbness.  Reduce your risk of vascular disease:  Stop smoking. If you would like help, call [facility] at 1-800-QUIT-NOW 8736359909) or [place] at [place] your cholesterol Maintain a desired weight Control your diabetes Keep your blood pressure down  Dialysis  It will take several weeks to several months for your new dialysis access to be ready for use. Your surgeon will determine when it is okay to use it. Your nephrologist will continue to direct your dialysis. You can continue to use your Permcath until your new access is ready for use.   11/17/2020 Fred [name] [id number] [birth year]  Surgeon(s): Early, Arvilla Meres, MD  Procedure(s): LEFT ARM ARTERIOVENOUS (AV) FISTULA CREATION   May stick graft immediately   May stick graft on designated area only:   X Do not stick left AV fistula for 12 weeks    If you have any questions, please call the office at  [phone]. 

## 2020-11-18 ENCOUNTER — Encounter (HOSPITAL_COMMUNITY): Payer: Self-pay | Admitting: Vascular Surgery

## 2020-11-26 ENCOUNTER — Encounter: Payer: Medicare Other | Admitting: Family

## 2020-12-16 ENCOUNTER — Other Ambulatory Visit: Payer: Self-pay

## 2020-12-16 DIAGNOSIS — E785 Hyperlipidemia, unspecified: Secondary | ICD-10-CM | POA: Insufficient documentation

## 2020-12-16 DIAGNOSIS — Z7409 Other reduced mobility: Secondary | ICD-10-CM | POA: Insufficient documentation

## 2020-12-16 DIAGNOSIS — R531 Weakness: Secondary | ICD-10-CM | POA: Insufficient documentation

## 2020-12-16 DIAGNOSIS — E559 Vitamin D deficiency, unspecified: Secondary | ICD-10-CM | POA: Insufficient documentation

## 2020-12-16 DIAGNOSIS — N186 End stage renal disease: Secondary | ICD-10-CM

## 2020-12-22 ENCOUNTER — Ambulatory Visit (INDEPENDENT_AMBULATORY_CARE_PROVIDER_SITE_OTHER): Payer: Medicare Other | Admitting: Physician Assistant

## 2020-12-22 ENCOUNTER — Other Ambulatory Visit: Payer: Self-pay

## 2020-12-22 ENCOUNTER — Ambulatory Visit (HOSPITAL_COMMUNITY)
Admission: RE | Admit: 2020-12-22 | Discharge: 2020-12-22 | Disposition: A | Discharge status: Home / Self Care | Payer: Medicare Other | Source: Ambulatory Visit | Attending: Vascular Surgery | Admitting: Vascular Surgery

## 2020-12-22 VITALS — BP 118/79 | HR 91 | Temp 98.1°F | Resp 20 | Ht 74.0 in

## 2020-12-22 DIAGNOSIS — N186 End stage renal disease: Secondary | ICD-10-CM | POA: Insufficient documentation

## 2020-12-22 DIAGNOSIS — Z992 Dependence on renal dialysis: Secondary | ICD-10-CM | POA: Insufficient documentation

## 2020-12-22 NOTE — Progress Notes (Signed)
    Postoperative Access Visit   History of Present Illness   Fred [name] is a 50 y.o. year old male who presents for postoperative follow-up for: left brachiocephalic arteriovenous fistula (Date: 11/17/20) by Dr. Donnetta Hutching.  The patient's wounds are healed.  The patient denies steal symptoms.  He currently resides in a nursing facility due to recent [place] by Dr. Sharol Given.  He is dialyzing via R IJ TDC on a T, Th, S schedule at the [facility] location.  Physical Examination   Vitals:   12/22/20 1424  BP: 118/79  Pulse: 91  Resp: 20  Temp: 98.1 F (36.7 C)  TempSrc: Temporal  SpO2: 98%  Height: '6\' 2"'$  (1.88 m)   Body mass index is 31.31 kg/m.  left arm Incision is healed, 1+ L radial pulse, hand grip is 5/5, sensation in digits is intact, palpable thrill, bruit can be auscultated     Medical Decision Making   Fred [name] is a 50 y.o. year old male who presents s/p left brachiocephalic arteriovenous fistula  Patent L brachiocephalic fistula without signs or symptoms of steal syndrome Path of fistula seems to be somewhat deep based on physical exam and duplex Recheck fistula duplex in 4 weeks to allow more time for fistula to mature.  He is aware he may require a superficialization procedure prior to using AVF for dialysis   Fred Ligas PA-C [facility] [place] Office: [phone]  Clinic MD: Scot Dock

## 2020-12-23 ENCOUNTER — Other Ambulatory Visit: Payer: Self-pay

## 2020-12-23 DIAGNOSIS — Z992 Dependence on renal dialysis: Secondary | ICD-10-CM

## 2020-12-23 DIAGNOSIS — N186 End stage renal disease: Secondary | ICD-10-CM

## 2020-12-24 ENCOUNTER — Other Ambulatory Visit: Payer: Self-pay

## 2020-12-24 ENCOUNTER — Encounter: Payer: Self-pay | Admitting: Family

## 2020-12-24 ENCOUNTER — Ambulatory Visit (INDEPENDENT_AMBULATORY_CARE_PROVIDER_SITE_OTHER): Payer: Medicare Other | Admitting: Family

## 2020-12-24 DIAGNOSIS — Z89511 Acquired absence of right leg below knee: Secondary | ICD-10-CM

## 2020-12-24 NOTE — Progress Notes (Signed)
Post-Op Visit Note   Patient: Fred [name]           Date of Birth: 12-30-1970           MRN: [id number] Visit Date: 12/24/2020 PCP: Wenda Low, MD  Chief Complaint: No chief complaint on file.   HPI:  HPI The patient is a 50 year old gentleman seen status post right below-knee amputation.  Staples are in place.  He is residing at skilled nursing continues with limb protector Ortho Exam On examination of the right below-knee amputation this is well-healed staples harvested today by incident there is no gaping no erythema no drainage no sign of infection  Visit Diagnoses: No diagnosis found.  Plan: Given an order to [facility] clinic for prosthesis set up.  K2 level ambulation.  We will follow-up with [place] in 2 months  Follow-Up Instructions: No follow-ups on file.   Imaging: No results found.  Orders:  No orders of the defined types were placed in this encounter.  No orders of the defined types were placed in this encounter.    PMFS History: Patient Active Problem List   Diagnosis Date Noted   Hyperlipidemia 12/16/2020   Impaired functional mobility, balance, gait, and endurance 12/16/2020   Vitamin D deficiency 12/16/2020   Weakness generalized 12/16/2020   Acquired absence of right leg below knee ([place]) 11/04/2020   Hypertension, uncontrolled 11/01/2020   Abscess of bursa of right ankle 10/29/2020   Subacute osteomyelitis, right ankle and foot ([place])    Diarrhea    History of Clostridioides difficile colitis    Fever    Anemia of chronic disease    ESRD on dialysis [facility])    Diabetic peripheral neuropathy (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   Pressure injury of both heels, unstageable ([place]) 04/29/2020   Pneumonia due to COVID-19 virus 04/29/2020   ESRD (end stage renal disease) ([place]) 04/29/2020   Iron deficiency anemia, unspecified 04/09/2020   Allergy, unspecified, sequela 04/06/2020   Coagulation defect, unspecified ([place]) 04/06/2020   Encounter for immunization 04/06/2020   Pain, unspecified 04/06/2020   Personal history of anaphylaxis 04/06/2020   Severe protein-calorie malnutrition ([place]) 03/28/2020   Cough 02/12/2020   High risk medication use 09/27/2016   Diabetic polyneuropathy associated with type 2 diabetes mellitus ([place]) 07/19/2016   Past Medical History:  Diagnosis Date   Anemia    takes iron pills   Chronic kidney disease    HD MWF   Diabetes mellitus without complication ([place])    type 2   Dyspnea    SOB - uses inhaler   GERD (gastroesophageal reflux disease)    Hypertension     History reviewed. No pertinent family history.  Past Surgical History:  Procedure Laterality Date   AMPUTATION Right 10/29/2020   Procedure: RIGHT BELOW KNEE AMPUTATION;  Surgeon: Newt Minion, MD;  Location: [place];  Service: Orthopedics;  Laterality: Right;   AV FISTULA PLACEMENT Left 11/17/2020  Procedure: LEFT ARM ARTERIOVENOUS (AV) FISTULA CREATION;  Surgeon: Rosetta Posner, MD;  Location: [facility] OR;  Service: Vascular;  Laterality: Left;   CHOLECYSTECTOMY     Social History   Occupational History   Not on file  Tobacco Use   Smoking status: Never   Smokeless tobacco: Never  Vaping Use   Vaping Use: Never used  Substance and Sexual Activity   Alcohol use: Never   Drug use: Never   Sexual activity: Not on file

## 2021-01-26 ENCOUNTER — Ambulatory Visit (INDEPENDENT_AMBULATORY_CARE_PROVIDER_SITE_OTHER): Payer: Medicare Other | Admitting: Physician Assistant

## 2021-01-26 ENCOUNTER — Ambulatory Visit (HOSPITAL_COMMUNITY)
Admission: RE | Admit: 2021-01-26 | Discharge: 2021-01-26 | Disposition: A | Discharge status: Home / Self Care | Payer: Medicare Other | Source: Ambulatory Visit | Attending: Vascular Surgery | Admitting: Vascular Surgery

## 2021-01-26 ENCOUNTER — Other Ambulatory Visit: Payer: Self-pay

## 2021-01-26 VITALS — BP 145/96 | HR 89 | Temp 97.9°F | Resp 20

## 2021-01-26 DIAGNOSIS — Z992 Dependence on renal dialysis: Secondary | ICD-10-CM

## 2021-01-26 DIAGNOSIS — N186 End stage renal disease: Secondary | ICD-10-CM

## 2021-01-26 NOTE — Progress Notes (Signed)
POST OPERATIVE DIALYSIS ACCESS OFFICE NOTE    CC:  F/u for dialysis access surgery  HPI:  This is a 50 y.o. male who is s/p left brachiocephalic arteriovenous fistula (Date: 11/17/20) by Dr. Donnetta Hutching.  The patient's wounds are healed.  The path of fistula seems to be somewhat deep based on physical exam and duplex on follow-up 12/22/2020. He returns for repeat duplex and exam.  He patient denies steal symptoms.  He currently resides at [facility] due to recent [place] by Dr. Sharol Given.  He is dialyzing via R IJ TDC on a T, Th, S schedule at the [facility] location.   Allergies  Allergen Reactions   Atorvastatin Hives    NOT on MAR   Trazodone Hcl Other (See Comments)    Confusion (intolerance)    Current Outpatient Medications  Medication Sig Dispense Refill   acetaminophen (TYLENOL) 325 MG tablet Take 650 mg by mouth every 6 (six) hours as needed for mild pain or fever (>101 F).     albuterol (VENTOLIN HFA) 108 (90 Base) MCG/ACT inhaler Inhale 1-2 puffs into the lungs every 4 (four) hours as needed for wheezing or shortness of breath.     Amino Acids-Protein Hydrolys (PRO-STAT SUGAR FREE PO) Take 30 mLs by mouth in the morning and at bedtime.     amLODipine (NORVASC) 5 MG tablet Take 1 tablet (5 mg total) by mouth at bedtime. (Patient taking differently: Take 5 mg by mouth See admin instructions. Take 1 tablet (5 mg) by mouth on morning of dialysis Tuesdays, Thursdays & Saturdays. Hold if SBP<120)     cadexomer iodine (IODOSORB) 0.9 % gel Apply 1 application topically every other day. Applied to left 4th toe     Cholecalciferol (VITAMIN D3) 50 MCG (2000 UT) TABS Take 2,000 Units by mouth daily.     citalopram (CELEXA) 20 MG tablet Take 1 tablet (20 mg total) by mouth daily. (Patient taking differently: Take 20 mg by mouth in the morning.)     ferrous sulfate 325 (65 FE) MG tablet Take 325 mg by mouth in the morning.     insulin glargine (LANTUS) 100 UNIT/ML injection Inject 0.08 mLs (8  Units total) into the skin at bedtime. (Patient taking differently: Inject 11 Units into the skin at bedtime.)     lanthanum (FOSRENOL) 500 MG chewable tablet Chew 500 mg by mouth 3 (three) times daily with meals.     melatonin 3 MG TABS tablet Take 3 mg by mouth at bedtime. (1900)     midodrine (PROAMATINE) 10 MG tablet Take 10 mg by mouth See admin instructions. Take 1 tablet (10 mg) by mouth before dialysis on Tuesdays, Thursdays & Saturdays     multivitamin (RENA-VIT) TABS tablet Take 1 tablet by mouth at bedtime.     Nutritional Supplements (NEPRO/CARBSTEADY PO) Take 237 mLs by mouth in the morning.     ondansetron (ZOFRAN) 4 MG tablet Take 1 tablet (4 mg total) by mouth every 6 (six) hours as needed for nausea. 20 tablet 0   oxyCODONE (OXY IR/ROXICODONE) 5 MG immediate release tablet Take 5-10 mg by mouth every 4 (four) hours as needed for moderate pain.     pantoprazole (PROTONIX) 40 MG tablet Take 1 tablet (40 mg total) by mouth daily at 6 (six) AM.     promethazine (PHENERGAN) 25 MG suppository Place 25 mg rectally every 6 (six) hours as needed for nausea or vomiting.     promethazine (PHENERGAN) 25 MG/ML injection  Inject 25 mg into the vein every 6 (six) hours as needed for nausea or vomiting.     traZODone (DESYREL) 50 MG tablet Take 50 mg by mouth at bedtime.     vitamin C (ASCORBIC ACID) 500 MG tablet Take 500 mg by mouth in the morning.     No current facility-administered medications for this visit.     ROS:  See HPI  Vitals:   01/26/21 0826  BP: (!) 145/96  Pulse: 89  Resp: 20  Temp: 97.9 F (36.6 C)  SpO2: 98%      Physical Exam: General appearance: awake, alert in NAD Chest:  Dressing dry and intact. Respirations: unlabored; no dyspnea at rest Left upper extremity: Hand is warm with 5/5 grip strength. Motor function and sensation intact. Good bruit and thrill in fistula. Difficult to palpate from beyond [facility] Incision(s): Well healed  Dialysis duplex on  01/26/2021 OUTFLOW VEINPSV (cm/s)Diameter (cm)Depth (cm)    Describe      +------------+----------+-------------+----------+----------------+  Shoulder       182        0.65        1.24                     +------------+----------+-------------+----------+----------------+  Prox UA        318        0.61        1.03                     +------------+----------+-------------+----------+----------------+  Mid UA         122        0.65        0.48   competing branch  +------------+----------+-------------+----------+----------------+  Dist UA        662        0.63        0.48   competing branch  +------------+----------+-------------+----------+----------------+  AC Fossa       318        0.99        0.37                     +------------+----------+-------------+----------+----------------+    Summary:  Patent arteriovenous fistula.     Two competing branches observed.   *See table(s) above for measurements and observations.     Diagnosing physician: Servando Snare MD   -  Assessment/Plan:   -pt does not have evidence of steal syndrome -dialysis duplex today reveals fistula to be of adequate diameter but too deep for access -plan superficialization and ligation of competing branches on non-dialysis day. Patient is in agreement with this plan  Barbie Banner, PA-C 01/26/2021 8:11 AM [facility] [phone]  Clinic MD:  Dr. Scot Dock

## 2021-02-02 NOTE — Pre-Procedure Instructions (Signed)
Fred [name]  02/02/2021    Your procedure is scheduled on Mon., Sept. 19, 2022 from 8:00AM-9:30AM.  Report to [facility] Entrance "A" at 5:30AM  Call this number if you have problems the morning of surgery:  [phone]   Remember:  Do not eat or drink after midnight on Sept. 18th    Take these medicines the morning of surgery with A SIP OF WATER: Please provide the times the medicines were given the morning of surgery. Citalopram (CELEXA) Pantoprazole (PROTONIX)  If Needed: Acetaminophen (TYLENOL) OxyCODONE (OXY IR/ROXICODONE) Ondansetron (ZOFRAN) Albuterol (VENTOLIN HFA)-bring day of surgery  As of today, STOP taking all Aspirin (unless instructed by your doctor) and Other Aspirin containing products, Vitamins, Fish oils, and Herbal medications. Also stop all NSAIDS i.e. Advil, Ibuprofen, Motrin, Aleve, Anaprox, Naproxen, BC, Goody Powders, and all Supplements.    WHAT DO I DO ABOUT MY DIABETES MEDICATION?  THE NIGHT BEFORE SURGERY, take ______5_____ units of _____Insulin glargine (LANTUS)______insulin.    How to Manage Your Diabetes Before and After Surgery  Why is it important to control my blood sugar before and after surgery? Improving blood sugar levels before and after surgery helps healing and can limit problems. A way of improving blood sugar control is eating a healthy diet by:  Eating less sugar and carbohydrates  Increasing activity/exercise  Talking with your doctor about reaching your blood sugar goals High blood sugars (greater than 180 mg/dL) can raise your risk of infections and slow your recovery, so you will need to focus on controlling your diabetes during the weeks before surgery. Make sure that the doctor who takes care of your diabetes knows about your planned surgery including the date and location.  How do I manage my blood sugar before surgery? Check your blood sugar at least 4 times a day, starting 2 days before surgery, to make  sure that the level is not too high or low. Check your blood sugar the morning of your surgery when you wake up and every 2 hours until you get to the Short Stay unit. If your blood sugar is less than 70 mg/dL, you will need to treat for low blood sugar: Do not take insulin. Treat a low blood sugar (less than 70 mg/dL) with  cup of clear juice (cranberry or apple), 4 glucose tablets, OR glucose gel. Recheck blood sugar in 15 minutes after treatment (to make sure it is greater than 70 mg/dL). If your blood sugar is not greater than 70 mg/dL on recheck, call [phone]  for further instructions. If your CBG is greater than 220 mg/dL, inform the staff upon arrival to Short Stay.  Reviewed and Endorsed by [facility] Patient Education Committee, August 2015  No Smoking of any kind, Tobacco/Vaping, or Alcohol products 24 hours prior to your procedure. If you use a Cpap at night, you may bring all equipment for your overnight stay.     Day of Surgery:  Do not wear jewelry.  Do not wear lotions, powders, colognes, or deodorant.  Do not shave 48 hours prior to surgery.  Men may shave face and neck.  Do not bring valuables to the hospital.  [facility] is not responsible for any belongings or valuables.  Contacts, dentures or bridgework may not be worn into surgery.    For patients admitted to the hospital, discharge time will be determined by your treatment team.  Patients discharged the day of surgery will not be allowed to drive home, and someone  age 4 and over needs to stay with them for 24 hours.  Oral Hygiene is also important to reduce your risk of infection.  Remember - BRUSH YOUR TEETH THE MORNING OF SURGERY WITH YOUR REGULAR TOOTHPASTE  Special instructions:  [place]- Preparing For Surgery  Before surgery, you can play an important role. Because skin is not sterile, your skin needs to be as free of germs as possible. You can reduce the number of germs on your skin by bathing  prior to your surgery. If you have an antibacterial soap, please use it.   Please follow these instructions carefully.   Shower the NIGHT BEFORE SURGERY and or the MORNING OF SURGERY.   Reminders: Do not apply any deodorants/lotions.  Please wear clean clothes to the hospital/surgery center.   Remember to brush your teeth WITH YOUR REGULAR TOOTHPASTE.  Please read over the following fact sheets that you were given.

## 2021-02-04 ENCOUNTER — Encounter (HOSPITAL_COMMUNITY): Payer: Self-pay | Admitting: Vascular Surgery

## 2021-02-06 NOTE — Anesthesia Preprocedure Evaluation (Addendum)
Anesthesia Evaluation  Patient identified by MRN, date of birth, ID band Patient awake    Reviewed: Allergy & Precautions, NPO status , Patient's Chart, lab work & pertinent test results  History of Anesthesia Complications Negative for: history of anesthetic complications  Airway Mallampati: II  TM Distance: >3 FB Neck ROM: Full    Dental no notable dental hx.    Pulmonary neg pulmonary ROS,    Pulmonary exam normal        Cardiovascular hypertension, Pt. on medications Normal cardiovascular exam     Neuro/Psych negative neurological ROS  negative psych ROS   GI/Hepatic Neg liver ROS, GERD  Medicated and Controlled,  Endo/Other  diabetes, Insulin Dependent  Renal/GU ESRFRenal disease     Musculoskeletal negative musculoskeletal ROS (+)   Abdominal (+) + obese,   Peds  Hematology  (+) anemia ,   Anesthesia Other Findings End Stage Renal Disease  Reproductive/Obstetrics                           Anesthesia Physical  Anesthesia Plan  ASA: 3  Anesthesia Plan: General   Post-op Pain Management:    Induction: Intravenous  PONV Risk Score and Plan: 2 and Ondansetron, Dexamethasone and Treatment may vary due to age or medical condition  Airway Management Planned: LMA  Additional Equipment:   Intra-op Plan:   Post-operative Plan: Extubation in OR  Informed Consent: I have reviewed the patients History and Physical, chart, labs and discussed the procedure including the risks, benefits and alternatives for the proposed anesthesia with the patient or authorized representative who has indicated his/her understanding and acceptance.     Dental advisory given  Plan Discussed with: Anesthesiologist and CRNA  Anesthesia Plan Comments: (Reviewed PAT note written 11/16/2020 by [name], PA-C. )       Anesthesia Quick Evaluation

## 2021-02-07 ENCOUNTER — Ambulatory Visit (HOSPITAL_COMMUNITY): Payer: Medicare Other | Admitting: Anesthesiology

## 2021-02-07 ENCOUNTER — Ambulatory Visit (HOSPITAL_COMMUNITY)
Admission: RE | Admit: 2021-02-07 | Discharge: 2021-02-07 | Disposition: A | Discharge status: Home / Self Care | Payer: Medicare Other | Attending: Vascular Surgery | Admitting: Vascular Surgery

## 2021-02-07 ENCOUNTER — Other Ambulatory Visit: Payer: Self-pay

## 2021-02-07 ENCOUNTER — Encounter (HOSPITAL_COMMUNITY): Payer: Self-pay | Admitting: Vascular Surgery

## 2021-02-07 ENCOUNTER — Encounter (HOSPITAL_COMMUNITY)
Admission: RE | Disposition: A | Discharge status: Home / Self Care | Payer: Self-pay | Source: Home / Self Care | Attending: Vascular Surgery

## 2021-02-07 DIAGNOSIS — Z992 Dependence on renal dialysis: Secondary | ICD-10-CM | POA: Diagnosis not present

## 2021-02-07 DIAGNOSIS — N186 End stage renal disease: Secondary | ICD-10-CM | POA: Diagnosis not present

## 2021-02-07 DIAGNOSIS — T82520A Displacement of surgically created arteriovenous fistula, initial encounter: Secondary | ICD-10-CM | POA: Diagnosis not present

## 2021-02-07 DIAGNOSIS — T82510A Breakdown (mechanical) of surgically created arteriovenous fistula, initial encounter: Secondary | ICD-10-CM | POA: Insufficient documentation

## 2021-02-07 DIAGNOSIS — Z452 Encounter for adjustment and management of vascular access device: Secondary | ICD-10-CM | POA: Insufficient documentation

## 2021-02-07 DIAGNOSIS — Z79899 Other long term (current) drug therapy: Secondary | ICD-10-CM | POA: Insufficient documentation

## 2021-02-07 DIAGNOSIS — Z888 Allergy status to other drugs, medicaments and biological substances status: Secondary | ICD-10-CM | POA: Diagnosis not present

## 2021-02-07 DIAGNOSIS — Y841 Kidney dialysis as the cause of abnormal reaction of the patient, or of later complication, without mention of misadventure at the time of the procedure: Secondary | ICD-10-CM | POA: Insufficient documentation

## 2021-02-07 HISTORY — PX: FISTULA SUPERFICIALIZATION: SHX6341

## 2021-02-07 LAB — POCT I-STAT, CHEM 8
BUN: 35 mg/dL — ABNORMAL HIGH (ref 6–20)
Calcium, Ion: 1.15 mmol/L (ref 1.15–1.40)
Chloride: 101 mmol/L (ref 98–111)
Creatinine, Ser: 5.6 mg/dL — ABNORMAL HIGH (ref 0.61–1.24)
Glucose, Bld: 92 mg/dL (ref 70–99)
HCT: 36 % — ABNORMAL LOW (ref 39.0–52.0)
Hemoglobin: 12.2 g/dL — ABNORMAL LOW (ref 13.0–17.0)
Potassium: 4.8 mmol/L (ref 3.5–5.1)
Sodium: 135 mmol/L (ref 135–145)
TCO2: 27 mmol/L (ref 22–32)

## 2021-02-07 LAB — GLUCOSE, CAPILLARY
Glucose-Capillary: 89 mg/dL (ref 70–99)
Glucose-Capillary: 95 mg/dL (ref 70–99)

## 2021-02-07 SURGERY — FISTULA SUPERFICIALIZATION
Anesthesia: General | Site: Arm Upper | Laterality: Left

## 2021-02-07 MED ORDER — VANCOMYCIN HCL 1500 MG/300ML IV SOLN
1500.0000 mg | INTRAVENOUS | Status: AC
  Administered 2021-02-07: 1500 mg via INTRAVENOUS
  Filled 2021-02-07: qty 300

## 2021-02-07 MED ORDER — PROPOFOL 10 MG/ML IV BOLUS
INTRAVENOUS | Status: DC | PRN
  Administered 2021-02-07: 130 mg via INTRAVENOUS
  Administered 2021-02-07: 50 mg via INTRAVENOUS

## 2021-02-07 MED ORDER — 0.9 % SODIUM CHLORIDE (POUR BTL) OPTIME
TOPICAL | Status: DC | PRN
  Administered 2021-02-07: 1000 mL

## 2021-02-07 MED ORDER — FENTANYL CITRATE (PF) 100 MCG/2ML IJ SOLN
INTRAMUSCULAR | Status: DC | PRN
  Administered 2021-02-07: 100 ug via INTRAVENOUS
  Administered 2021-02-07: 150 ug via INTRAVENOUS

## 2021-02-07 MED ORDER — SODIUM CHLORIDE 0.9 % IV SOLN
INTRAVENOUS | Status: DC | PRN
  Administered 2021-02-07: 50 ug/min via INTRAVENOUS

## 2021-02-07 MED ORDER — MIDAZOLAM HCL 2 MG/2ML IJ SOLN
INTRAMUSCULAR | Status: AC
  Filled 2021-02-07: qty 2

## 2021-02-07 MED ORDER — LACTATED RINGERS IV SOLN: INTRAVENOUS | Status: DC

## 2021-02-07 MED ORDER — ORAL CARE MOUTH RINSE: 15.0000 mL | Freq: Once | OROMUCOSAL | Status: AC

## 2021-02-07 MED ORDER — ONDANSETRON HCL 4 MG/2ML IJ SOLN
INTRAMUSCULAR | Status: DC | PRN
  Administered 2021-02-07: 4 mg via INTRAVENOUS

## 2021-02-07 MED ORDER — DEXAMETHASONE SODIUM PHOSPHATE 10 MG/ML IJ SOLN
INTRAMUSCULAR | Status: AC
  Filled 2021-02-07: qty 1

## 2021-02-07 MED ORDER — AMISULPRIDE (ANTIEMETIC) 5 MG/2ML IV SOLN
10.0000 mg | Freq: Once | INTRAVENOUS | Status: DC | PRN
Start: 2021-02-07 — End: 2021-02-07

## 2021-02-07 MED ORDER — DEXAMETHASONE SODIUM PHOSPHATE 4 MG/ML IJ SOLN
INTRAMUSCULAR | Status: DC | PRN
  Administered 2021-02-07: 8 mg via INTRAVENOUS

## 2021-02-07 MED ORDER — ONDANSETRON HCL 4 MG/2ML IJ SOLN
INTRAMUSCULAR | Status: AC
  Filled 2021-02-07: qty 2

## 2021-02-07 MED ORDER — LIDOCAINE 2% (20 MG/ML) 5 ML SYRINGE
INTRAMUSCULAR | Status: DC | PRN
  Administered 2021-02-07: 100 mg via INTRAVENOUS

## 2021-02-07 MED ORDER — CHLORHEXIDINE GLUCONATE 0.12 % MT SOLN
OROMUCOSAL | Status: AC
  Administered 2021-02-07: 15 mL via OROMUCOSAL
  Filled 2021-02-07: qty 15

## 2021-02-07 MED ORDER — LIDOCAINE-EPINEPHRINE (PF) 1 %-1:200000 IJ SOLN
INTRAMUSCULAR | Status: AC
  Filled 2021-02-07: qty 30

## 2021-02-07 MED ORDER — ETOMIDATE 2 MG/ML IV SOLN
INTRAVENOUS | Status: AC
  Filled 2021-02-07: qty 10

## 2021-02-07 MED ORDER — HEPARIN 6000 UNIT IRRIGATION SOLUTION
Status: AC
  Filled 2021-02-07: qty 500

## 2021-02-07 MED ORDER — PROMETHAZINE HCL 25 MG/ML IJ SOLN: 6.2500 mg | INTRAMUSCULAR | Status: DC | PRN

## 2021-02-07 MED ORDER — HEPARIN 6000 UNIT IRRIGATION SOLUTION
Status: DC | PRN
  Administered 2021-02-07: 1

## 2021-02-07 MED ORDER — CHLORHEXIDINE GLUCONATE 4 % EX LIQD: 60.0000 mL | Freq: Once | CUTANEOUS | Status: DC

## 2021-02-07 MED ORDER — SODIUM CHLORIDE 0.9 % IV SOLN
INTRAVENOUS | Status: DC
Start: 2021-02-07 — End: 2021-02-07

## 2021-02-07 MED ORDER — LIDOCAINE-EPINEPHRINE (PF) 1 %-1:200000 IJ SOLN
INTRAMUSCULAR | Status: DC | PRN
  Administered 2021-02-07: 15 mL

## 2021-02-07 MED ORDER — FENTANYL CITRATE (PF) 100 MCG/2ML IJ SOLN: 25.0000 ug | INTRAMUSCULAR | Status: DC | PRN

## 2021-02-07 MED ORDER — LABETALOL HCL 5 MG/ML IV SOLN: 5.0000 mg | Freq: Once | INTRAVENOUS | Status: AC

## 2021-02-07 MED ORDER — VANCOMYCIN HCL 1000 MG IV SOLR
INTRAVENOUS | Status: DC | PRN
  Administered 2021-02-07: 1500 mg via INTRAVENOUS

## 2021-02-07 MED ORDER — OXYCODONE-ACETAMINOPHEN 10-325 MG PO TABS: 1.0000 | ORAL_TABLET | Freq: Four times a day (QID) | ORAL | 0 refills | Status: AC | PRN

## 2021-02-07 MED ORDER — FENTANYL CITRATE (PF) 250 MCG/5ML IJ SOLN
INTRAMUSCULAR | Status: AC
  Filled 2021-02-07: qty 5

## 2021-02-07 MED ORDER — ROCURONIUM BROMIDE 10 MG/ML (PF) SYRINGE
PREFILLED_SYRINGE | INTRAVENOUS | Status: AC
  Filled 2021-02-07: qty 10

## 2021-02-07 MED ORDER — CELECOXIB 200 MG PO CAPS
200.0000 mg | ORAL_CAPSULE | Freq: Once | ORAL | Status: AC
  Administered 2021-02-07: 200 mg via ORAL
  Filled 2021-02-07: qty 1

## 2021-02-07 MED ORDER — AMLODIPINE BESYLATE 5 MG PO TABS
5.0000 mg | ORAL_TABLET | Freq: Every day | ORAL | Status: DC
  Administered 2021-02-07: 5 mg via ORAL
  Filled 2021-02-07: qty 1

## 2021-02-07 MED ORDER — CHLORHEXIDINE GLUCONATE 0.12 % MT SOLN: 15.0000 mL | Freq: Once | OROMUCOSAL | Status: AC

## 2021-02-07 MED ORDER — LABETALOL HCL 5 MG/ML IV SOLN
INTRAVENOUS | Status: AC
  Administered 2021-02-07: 5 mg via INTRAVENOUS
  Filled 2021-02-07: qty 4

## 2021-02-07 MED ORDER — ACETAMINOPHEN 500 MG PO TABS
1000.0000 mg | ORAL_TABLET | Freq: Once | ORAL | Status: AC
  Administered 2021-02-07: 1000 mg via ORAL
  Filled 2021-02-07: qty 2

## 2021-02-07 MED ORDER — LIDOCAINE 2% (20 MG/ML) 5 ML SYRINGE
INTRAMUSCULAR | Status: AC
  Filled 2021-02-07: qty 5

## 2021-02-07 MED ORDER — MIDAZOLAM HCL 5 MG/5ML IJ SOLN
INTRAMUSCULAR | Status: DC | PRN
Start: 2021-02-07 — End: 2021-02-07
  Administered 2021-02-07: 2 mg via INTRAVENOUS

## 2021-02-07 SURGICAL SUPPLY — 39 items
ARMBAND PINK RESTRICT EXTREMIT (MISCELLANEOUS) ×3 IMPLANT
BAG COUNTER SPONGE SURGICOUNT (BAG) ×3 IMPLANT
BNDG ELASTIC 6X5.8 VLCR STR LF (GAUZE/BANDAGES/DRESSINGS) ×3 IMPLANT
CANISTER SUCT 3000ML PPV (MISCELLANEOUS) ×3 IMPLANT
CLIP VESOCCLUDE MED 24/CT (CLIP) IMPLANT
CLIP VESOCCLUDE MED 6/CT (CLIP) ×3 IMPLANT
CLIP VESOCCLUDE SM WIDE 24/CT (CLIP) IMPLANT
CLIP VESOCCLUDE SM WIDE 6/CT (CLIP) ×6 IMPLANT
COVER PROBE W GEL 5X96 (DRAPES) ×3 IMPLANT
DERMABOND ADVANCED (GAUZE/BANDAGES/DRESSINGS) ×1
DERMABOND ADVANCED .7 DNX12 (GAUZE/BANDAGES/DRESSINGS) ×2 IMPLANT
ELECT REM PT RETURN 9FT ADLT (ELECTROSURGICAL) ×3
ELECTRODE REM PT RTRN 9FT ADLT (ELECTROSURGICAL) ×2 IMPLANT
GAUZE 4X4 16PLY ~~LOC~~+RFID DBL (SPONGE) ×3 IMPLANT
GLOVE SRG 8 PF TXTR STRL LF DI (GLOVE) ×4 IMPLANT
GLOVE SURG POLYISO LF SZ8 (GLOVE) IMPLANT
GLOVE SURG UNDER POLY LF SZ8 (GLOVE) ×2
GOWN STRL REUS W/ TWL LRG LVL3 (GOWN DISPOSABLE) ×4 IMPLANT
GOWN STRL REUS W/TWL 2XL LVL3 (GOWN DISPOSABLE) ×3 IMPLANT
GOWN STRL REUS W/TWL LRG LVL3 (GOWN DISPOSABLE) ×2
HEMOSTAT SPONGE AVITENE ULTRA (HEMOSTASIS) IMPLANT
KIT BASIN OR (CUSTOM PROCEDURE TRAY) ×3 IMPLANT
KIT TURNOVER KIT B (KITS) ×3 IMPLANT
NS IRRIG 1000ML POUR BTL (IV SOLUTION) ×3 IMPLANT
PACK CV ACCESS (CUSTOM PROCEDURE TRAY) ×3 IMPLANT
PAD ARMBOARD 7.5X6 YLW CONV (MISCELLANEOUS) ×6 IMPLANT
SPONGE T-LAP 18X18 ~~LOC~~+RFID (SPONGE) ×3 IMPLANT
SUT ETHILON 3 0 PS 1 (SUTURE) IMPLANT
SUT MNCRL AB 4-0 PS2 18 (SUTURE) ×6 IMPLANT
SUT PROLENE 6 0 BV (SUTURE) ×3 IMPLANT
SUT PROLENE 7 0 BV 1 (SUTURE) IMPLANT
SUT SILK 0 TIES 10X30 (SUTURE) ×3 IMPLANT
SUT VIC AB 2-0 CT1 27 (SUTURE) ×2
SUT VIC AB 2-0 CT1 TAPERPNT 27 (SUTURE) ×4 IMPLANT
SUT VIC AB 3-0 SH 27 (SUTURE) ×3
SUT VIC AB 3-0 SH 27X BRD (SUTURE) ×6 IMPLANT
TOWEL GREEN STERILE (TOWEL DISPOSABLE) ×3 IMPLANT
UNDERPAD 30X36 HEAVY ABSORB (UNDERPADS AND DIAPERS) ×3 IMPLANT
WATER STERILE IRR 1000ML POUR (IV SOLUTION) ×3 IMPLANT

## 2021-02-07 NOTE — Anesthesia Postprocedure Evaluation (Signed)
Anesthesia Post Note  Patient: [name]  Procedure(s) Performed: LEFT RADIOCEPHALIC ARTERIOVENOUS FISTULA REVISION (Left: Arm Upper)     Patient location during evaluation: PACU Anesthesia Type: General Level of consciousness: sedated Pain management: pain level controlled Vital Signs Assessment: post-procedure vital signs reviewed and stable Respiratory status: spontaneous breathing and respiratory function stable Cardiovascular status: stable Postop Assessment: no apparent nausea or vomiting Anesthetic complications: no   No notable events documented.  Last Vitals:  Vitals:   02/07/21 1010 02/07/21 1025  BP: 117/82 (!) 139/94  Pulse: 80 77  Resp: (!) 23 16  Temp:    SpO2: 99% 100%    Last Pain:  Vitals:   02/07/21 1010  TempSrc:   PainSc: 0-No pain                 Kensley Lares DANIEL

## 2021-02-07 NOTE — Transfer of Care (Signed)
Immediate Anesthesia Transfer of Care Note  Patient: [facility]  Procedure(s) Performed: LEFT RADIOCEPHALIC ARTERIOVENOUS FISTULA REVISION (Left: Arm Upper)  Patient Location: PACU  Anesthesia Type:General  Level of Consciousness: awake, alert , oriented and patient cooperative  Airway & Oxygen Therapy: Patient Spontanous Breathing  Post-op Assessment: Report given to RN and Post -op Vital signs reviewed and stable  Post vital signs: Reviewed and stable  Last Vitals:  Vitals Value Taken Time  BP 145/86 02/07/21 0941  Temp    Pulse 85 02/07/21 0943  Resp 15 02/07/21 0943  SpO2 100 % 02/07/21 0943  Vitals shown include unvalidated device data.  Last Pain:  Vitals:   02/07/21 0705  TempSrc:   PainSc: 0-No pain         Complications: No notable events documented.

## 2021-02-07 NOTE — Op Note (Signed)
    NAME: Fred [name]    MRN: [id number] DOB: 1970/11/04    DATE OF OPERATION: 02/07/2021  PREOP DIAGNOSIS:    End-stage renal disease  POSTOP DIAGNOSIS:    End-stage renal disease  PROCEDURE:    Left brachiocephalic fistula revision  SURGEON: Broadus John  ASSIST: Risa Grill PA  ANESTHESIA: Moderate  EBL: 5 mL  INDICATIONS:    Fred [name] is a 50 y.o. male with end-stage renal disease who has a history of previous left brachiocephalic fistula placement on 11/17/2020 with Dr. Sherren Mocha Early.  Patient was recently seen in clinic with excellent flows of the fistula however dialysis nurses have had a difficult time accessing the fistula due to its depth.  After discussing the risks and benefits of left brachiocephalic fistula revision-branch ligation and superficialization, patient elected to proceed.  FINDINGS:   Left brachiocephalic fistula [id number] deep  TECHNIQUE:   Patient was brought to the OR and laid in supine position.  Moderate anesthesia was induced. The patient was prepped and draped in standard fashion.  Lidocaine was brought to the field and a local block was performed.  The case began with ultrasound fistula mapping.  Multiple branches were noted and marked.  A longitudinal incision was made along the course of the cephalic vein with 5 cm bridge in the middle.  This was carried through the subcutaneous fat to the brachiocephalic fistula.  The fistula was mobilized and multiple branches ligated using 2-0 silk and clips.  Once mobilized, the subcutaneous tissue plane was made less than 5 mm from skin between the dermis and subcutaneous fat.  The subcutaneous fat was opposed to the opposite side using 2.0 vicryl, creating a bedding for the fistula.  Hemostasis was achieved with the use of cautery and suture.  The subcutaneous flap was closed using 3-0 Vicryl and 4-0 Monocryl at the skin.  There was a palpable thrill in the fistula at case completion with excellent pulse in  the wrist.    Given the complexity of the case a first assistant was necessary in order to expedient the procedure and safely perform the technical aspects of the operation.  Macie Burows, MD [facility]  DATE OF DICTATION:   02/07/2021  PLAN: Home today. Wound check in two weeks.

## 2021-02-07 NOTE — Progress Notes (Signed)
Patient vitals stable, in phase 2.  Awaiting transportation from [facility], report has been given to facility.

## 2021-02-07 NOTE — H&P (Signed)
Preop H&P  Pt seen and examined this morning. No changes from prior exam in the office.  After discussing the risks and benefits of fistula superficialization, Fred [name] elected to proceed.  Plan L brachiocephalic superficialization    CC:  F/u for dialysis access surgery  HPI:  This is a 50 y.o. male who is s/p left brachiocephalic arteriovenous fistula (Date: 11/17/20) by Dr. Donnetta Hutching.  The patient's wounds are healed.  The path of fistula seems to be somewhat deep based on physical exam and duplex on follow-up 12/22/2020. He returns for repeat duplex and exam.  He patient denies steal symptoms.  He currently resides at [facility] due to recent [place] by Dr. Sharol Given.  He is dialyzing via R IJ TDC on a T, Th, S schedule at the [facility] location.   Allergies  Allergen Reactions   Atorvastatin Hives   Trazodone Hcl Other (See Comments)    Confusion (intolerance)    Current Facility-Administered Medications  Medication Dose Route Frequency Provider Last Rate Last Admin   0.9 %  sodium chloride infusion   Intravenous Continuous Broadus John, MD 10 mL/hr at 02/07/21 0714 New Bag at 02/07/21 0714   amLODipine (NORVASC) tablet 5 mg  5 mg Oral Daily Duane Boston, MD   5 mg at 02/07/21 [id number]   chlorhexidine (HIBICLENS) 4 % liquid 4 application  60 mL Topical Once Broadus John, MD       And   chlorhexidine (HIBICLENS) 4 % liquid 4 application  60 mL Topical Once Broadus John, MD       lactated ringers infusion   Intravenous Continuous Duane Boston, MD       vancomycin (VANCOREADY) IVPB 1500 mg/300 mL  1,500 mg Intravenous 60 min Pre-Op Broadus John, MD 150 mL/hr at 02/07/21 0700 1,500 mg at 02/07/21 0700     ROS:  See HPI  Vitals:   02/07/21 0606 02/07/21 0712  BP:  (!) 229/108  Pulse: 85   Resp: 18   Temp: 98.3 F (36.8 C)   SpO2: 98%       Physical Exam: General appearance: awake, alert in NAD Chest:  Dressing dry and intact. Respirations: unlabored; no  dyspnea at rest Left upper extremity: Hand is warm with 5/5 grip strength. Motor function and sensation intact. Good bruit and thrill in fistula. Difficult to palpate from beyond [facility] Incision(s): Well healed  Dialysis duplex on 02/07/2021 OUTFLOW VEINPSV (cm/s)Diameter (cm)Depth (cm)    Describe      +------------+----------+-------------+----------+----------------+  Shoulder       182        0.65        1.24                     +------------+----------+-------------+----------+----------------+  Prox UA        318        0.61        1.03                     +------------+----------+-------------+----------+----------------+  Mid UA         122        0.65        0.48   competing branch  +------------+----------+-------------+----------+----------------+  Dist UA        662        0.63        0.48   competing branch  +------------+----------+-------------+----------+----------------+  [facility] Fossa  318        0.99        0.37                     +------------+----------+-------------+----------+----------------+    Summary:  Patent arteriovenous fistula.     Two competing branches observed.   *See table(s) above for measurements and observations.     Diagnosing physician: Servando Snare MD   -  Assessment/Plan:   -pt does not have evidence of steal syndrome -dialysis duplex today reveals fistula to be of adequate diameter but too deep for access -plan superficialization and ligation of competing branches on non-dialysis day. Patient is in agreement with this plan  Broadus John, MD 02/07/2021 7:41 AM [facility] [phone]  Clinic MD:  Dr. Scot Dock

## 2021-02-07 NOTE — Anesthesia Procedure Notes (Signed)
Procedure Name: LMA Insertion Date/Time: 02/07/2021 8:10 AM Performed by: Minerva Ends, CRNA Pre-anesthesia Checklist: Patient identified, Emergency Drugs available, Suction available and Patient being monitored Patient Re-evaluated:Patient Re-evaluated prior to induction Oxygen Delivery Method: Circle system utilized Preoxygenation: Pre-oxygenation with 100% oxygen Induction Type: IV induction Ventilation: Mask ventilation without difficulty LMA: LMA inserted LMA Size: 5.0 Tube type: Oral Number of attempts: 1 Placement Confirmation: ETT inserted through vocal cords under direct vision, positive ETCO2 and breath sounds checked- equal and bilateral Tube secured with: Tape Dental Injury: Teeth and Oropharynx as per pre-operative assessment

## 2021-02-08 ENCOUNTER — Encounter (HOSPITAL_COMMUNITY): Payer: Self-pay | Admitting: Vascular Surgery

## 2021-02-25 ENCOUNTER — Ambulatory Visit: Payer: Medicare Other | Admitting: Family

## 2021-03-04 ENCOUNTER — Ambulatory Visit (INDEPENDENT_AMBULATORY_CARE_PROVIDER_SITE_OTHER): Payer: Medicare Other | Admitting: Physician Assistant

## 2021-03-04 ENCOUNTER — Other Ambulatory Visit: Payer: Self-pay

## 2021-03-04 VITALS — BP 139/83 | HR 81 | Temp 98.0°F

## 2021-03-04 DIAGNOSIS — Z992 Dependence on renal dialysis: Secondary | ICD-10-CM

## 2021-03-04 DIAGNOSIS — N186 End stage renal disease: Secondary | ICD-10-CM

## 2021-03-04 NOTE — Progress Notes (Signed)
    Postoperative Access Visit   History of Present Illness   Fred [name] is a 50 y.o. year old male who presents for postoperative follow-up for: left brachiocephalic AV fistula revision with branch ligation and superficialization by Dr. Virl Cagey on 02/07/21. His initial fistula was created by Dr. Donnetta Hutching on 11/17/20.The patient's wounds are healing very nicely. The patient notes no steal symptoms.  The patient is  able to complete their activities of daily living. 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 He is currently dialyzing via right IJ TDC on Tues/ Thurs/ Sat at the [facility]   Physical Examination   Vitals:   03/04/21 1352  BP: 139/83  Pulse: 81  Temp: 98 F (36.7 C)  TempSrc: Skin  SpO2: 97%   There is no height or weight on file to calculate BMI.  left arm Incisions are healing very well, 1+ radial pulse, hand grip is 5/5, sensation in digits is intact, excellent thrill, bruit can be auscultated     Medical Decision Making   Fred [name] is a 50 y.o. year old male who presents s/p left brachiocephalic AV fistula revision with branch ligation and superficialization by Dr. Virl Cagey on 02/07/21. His incisions are healing very nicely. Fistula is easily palpable in LUE and has excellent thrill.  Patent is without signs or symptoms of steal syndrome The patient's access will be ready for use after 03/22/21 The patient's tunneled dialysis catheter can be removed when Nephrology is comfortable with the performance of the left AVF. This was placed in [place]. [facility] can be contacted for removal if needed The patient may follow up on a prn basis   Karoline Caldwell, PA-C [facility] Office: [phone]  Clinic MD: Dr. Virl Cagey

## 2021-08-30 IMAGING — MR MR LUMBAR SPINE W/O CM
4 of 5 series · 26 of 48 positions shown · non-contrast
Comparison: None.

CLINICAL DATA: Lower extremity weakness and poor rectal tone.

EXAM:
MRI LUMBAR SPINE WITHOUT CONTRAST
TECHNIQUE: Multiplanar, multisequence MR imaging of the lumbar spine was
performed. No intravenous contrast was administered.

[Series 5: T2 · sagittal · 4.0mm · 0.73mm/px · 6 of 15 slices shown (1 of 2)]
[im 1/15]
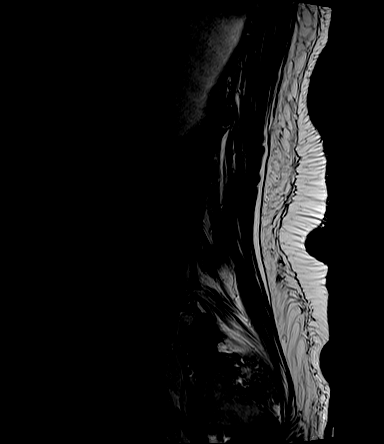
[im 3/15]
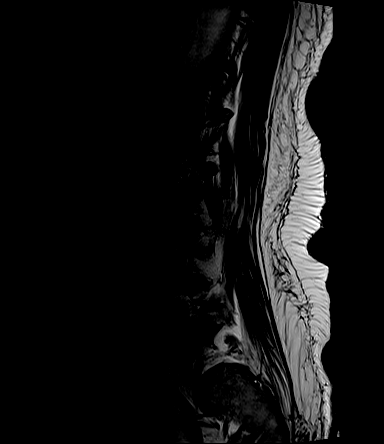
[im 6/15]
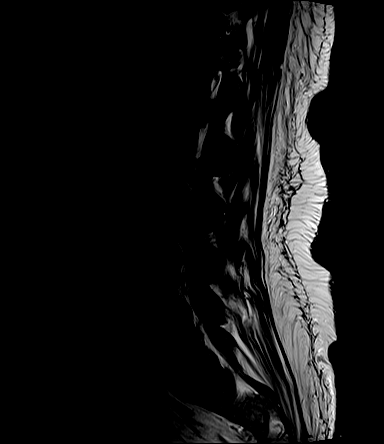
[im 9/15]
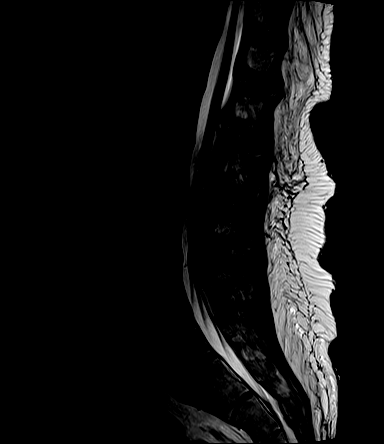
[im 12/15]
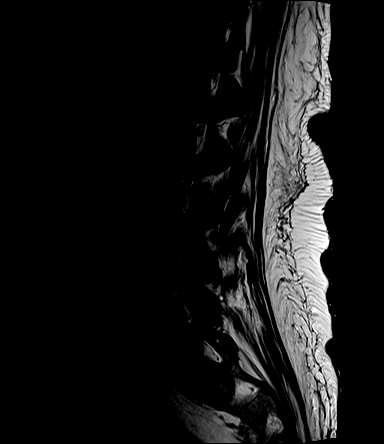
[im 15/15]
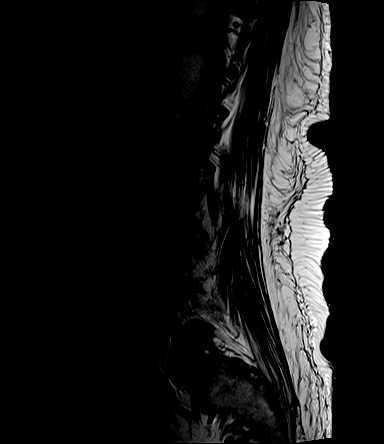

[Series 7: T1 · sagittal · 4.0mm · 0.88mm/px · 6 of 15 slices shown (1 of 2)]
[im 1/15]
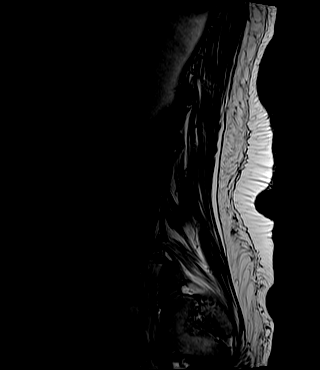
[im 3/15]
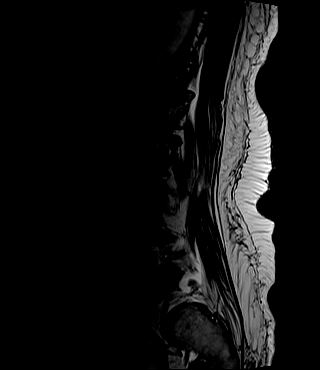
[im 6/15]
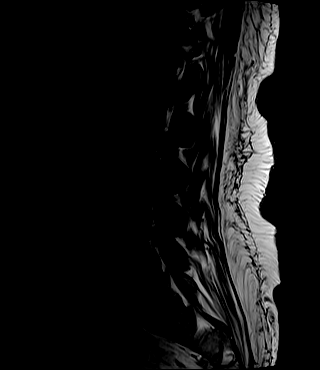
[im 9/15]
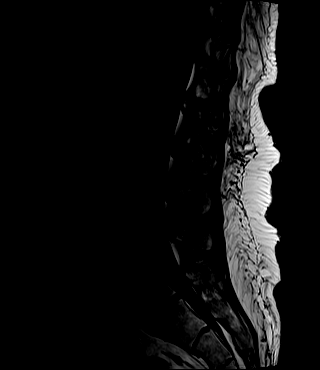
[im 12/15]
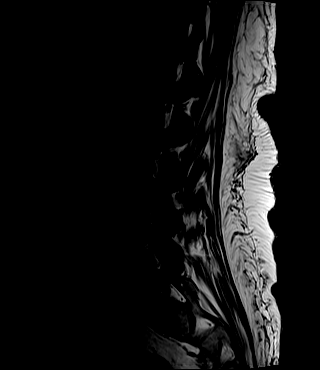
[im 15/15]
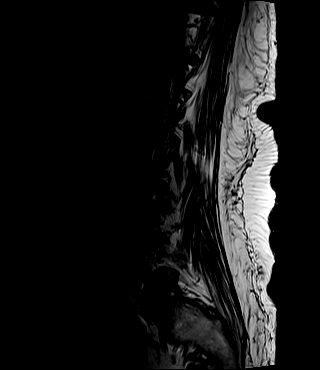

[Series 8: T2 · axial · 4.0mm · 0.57mm/px · z∈[-175,+54]mm · 9 of 37 slices shown (2 of 2)]
[im 1/37]
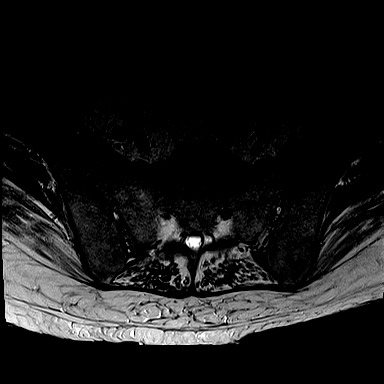
[im 6/37]
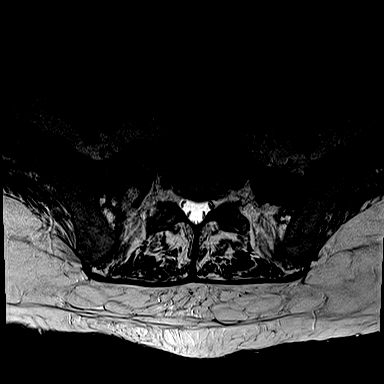
[im 11/37]
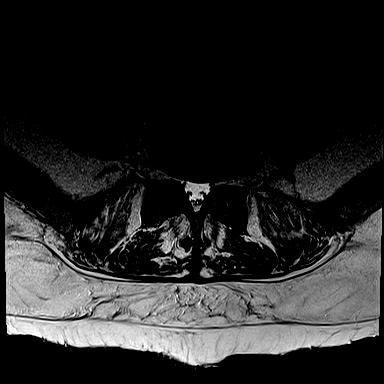
[im 16/37]
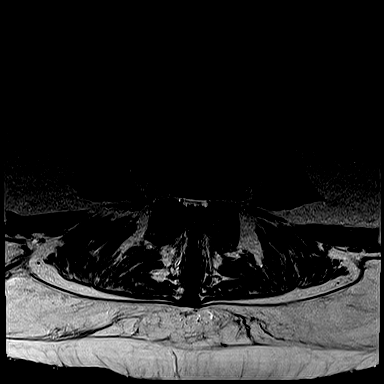
[im 19/37]
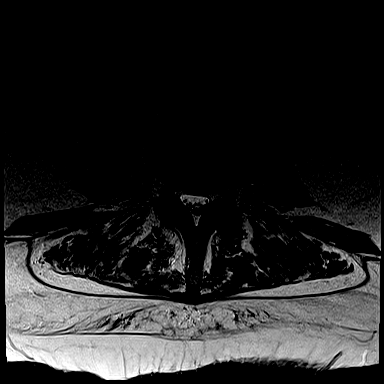
[im 21/37]
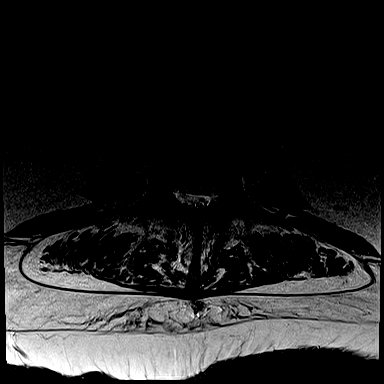
[im 26/37]
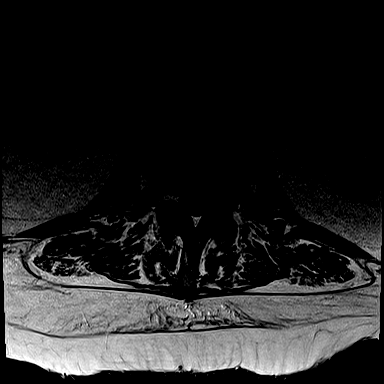
[im 31/37]
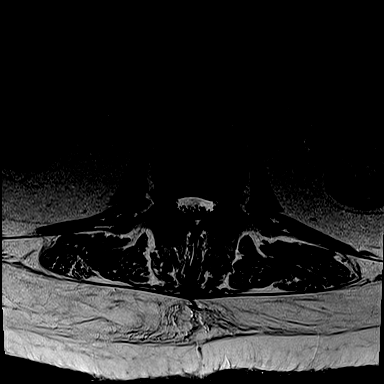
[im 37/37]
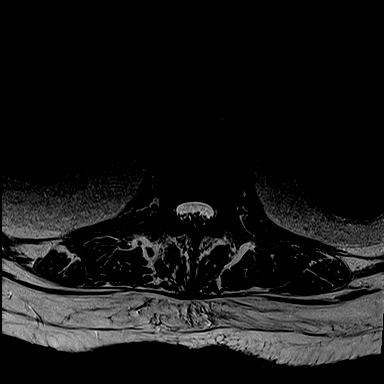

[Series 9: T1 · axial · 4.0mm · 0.34mm/px · z∈[-175,+24]mm · 5 of 37 slices shown (2 of 2)]
[im 1/37]
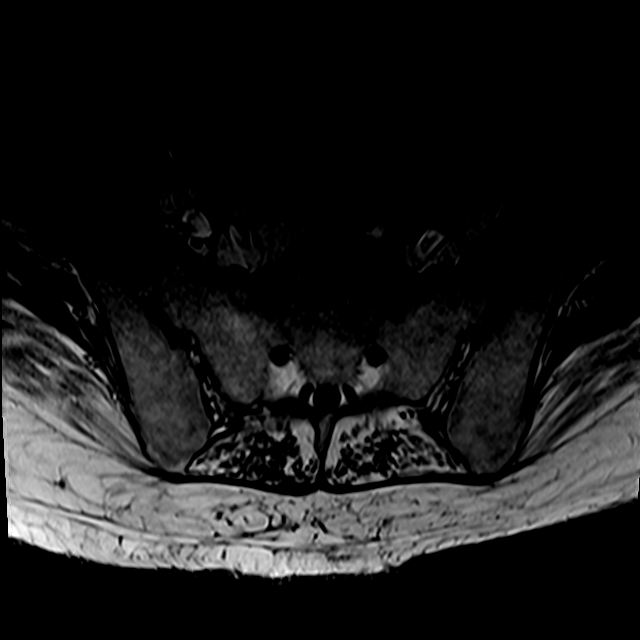
[im 6/37]
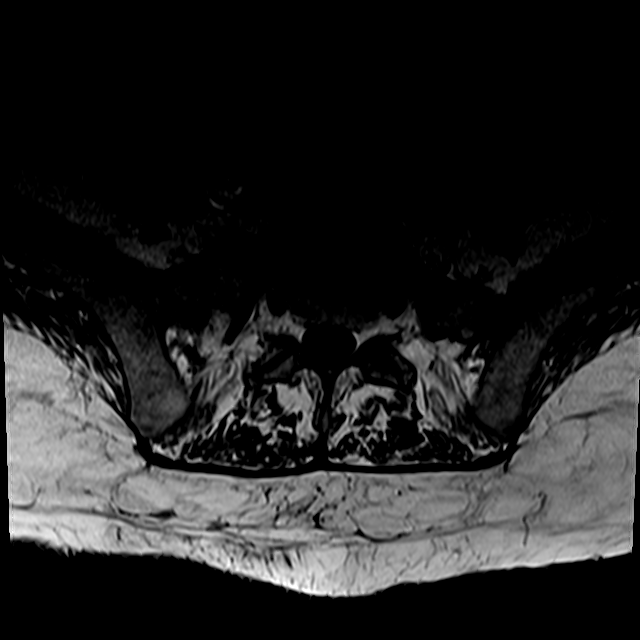
[im 11/37]
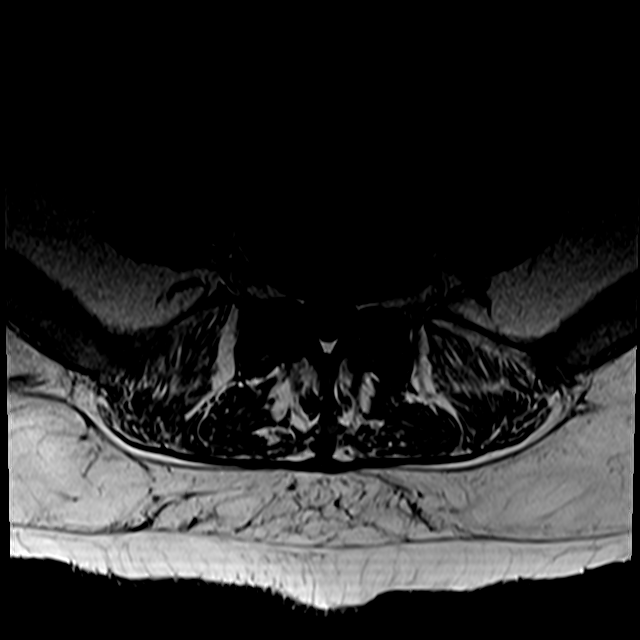
[im 19/37]
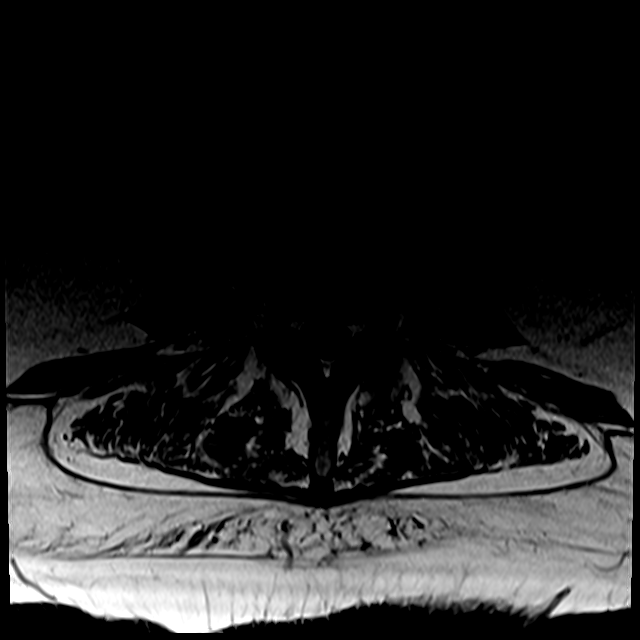
[im 31/37]
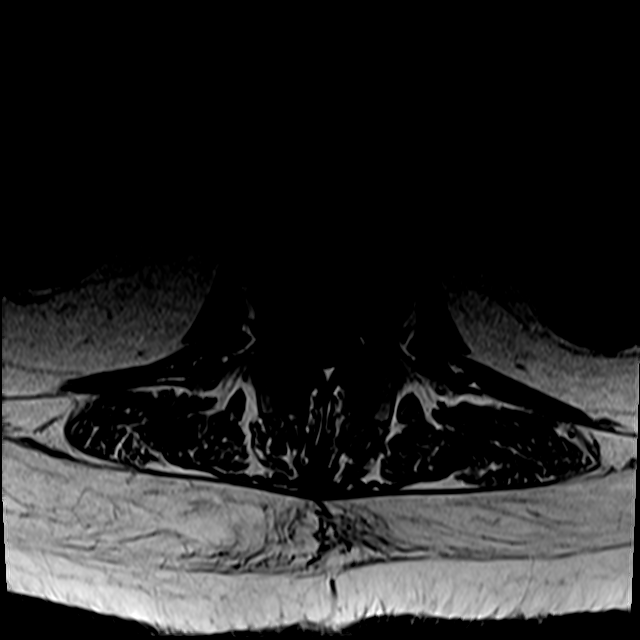

[26 of 48 positions shown; findings below may reference images not displayed]

FINDINGS: Segmentation: Transitional lumbosacral anatomy with rudimentary
L5-S1 disc space and right sided L5-S1 assimilation joint.

Alignment:  Normal

Vertebrae:  No fracture, evidence of discitis, or bone lesion.

Conus medullaris and cauda equina: Conus extends to the L1 level.
Conus and cauda equina appear normal.

Paraspinal and other soft tissues: Negative

Disc levels:

L1-L2: Normal disc space and facet joints. No spinal canal stenosis.
No neural foraminal stenosis.

L2-L3: Large left asymmetric disc bulge. Moderate-to-severe spinal
canal stenosis. Stenosis is greatest in the left lateral recess.
There is no neural foraminal stenosis.

L3-L4: Moderate facet hypertrophy with small disc bulge. No spinal
canal stenosis. No neural foraminal stenosis.

L4-L5: Mild facet hypertrophy. Normal disc. No spinal canal
stenosis. No neural foraminal stenosis.

L5-S1: Small disc space. No spinal canal stenosis. No neural
foraminal stenosis.

Visualized sacrum: Normal.
IMPRESSION: 1. Large left asymmetric disc bulge at L2-L3 with moderate-to-severe
spinal canal stenosis, greatest in the left lateral recess.
2. Moderate facet arthrosis at L3-4 and L4-5.
3. Transitional lumbosacral anatomy with rudimentary L5-S1 disc
space and right sided L5-S1 assimilation joint.
